# Patient Record
Sex: Female | Born: 1940
Health system: Southern US, Community
[De-identification: ages and names within clinical notes are randomized; demographics above are authoritative.]

## PROBLEM LIST (undated history)

## (undated) DIAGNOSIS — R350 Frequency of micturition: Secondary | ICD-10-CM

## (undated) DIAGNOSIS — F419 Anxiety disorder, unspecified: Secondary | ICD-10-CM

## (undated) DIAGNOSIS — R55 Syncope and collapse: Secondary | ICD-10-CM

## (undated) DIAGNOSIS — J069 Acute upper respiratory infection, unspecified: Secondary | ICD-10-CM

## (undated) DIAGNOSIS — R0602 Shortness of breath: Secondary | ICD-10-CM

## (undated) DIAGNOSIS — H269 Unspecified cataract: Secondary | ICD-10-CM

## (undated) DIAGNOSIS — M1990 Unspecified osteoarthritis, unspecified site: Secondary | ICD-10-CM

## (undated) DIAGNOSIS — N189 Chronic kidney disease, unspecified: Secondary | ICD-10-CM

## (undated) DIAGNOSIS — M545 Low back pain: Secondary | ICD-10-CM

## (undated) DIAGNOSIS — I639 Cerebral infarction, unspecified: Secondary | ICD-10-CM

## (undated) DIAGNOSIS — G4700 Insomnia, unspecified: Secondary | ICD-10-CM

## (undated) DIAGNOSIS — N318 Other neuromuscular dysfunction of bladder: Secondary | ICD-10-CM

## (undated) DIAGNOSIS — K449 Diaphragmatic hernia without obstruction or gangrene: Secondary | ICD-10-CM

## (undated) DIAGNOSIS — R002 Palpitations: Secondary | ICD-10-CM

## (undated) DIAGNOSIS — R42 Dizziness and giddiness: Secondary | ICD-10-CM

## (undated) DIAGNOSIS — G894 Chronic pain syndrome: Secondary | ICD-10-CM

## (undated) DIAGNOSIS — Z8679 Personal history of other diseases of the circulatory system: Secondary | ICD-10-CM

## (undated) DIAGNOSIS — J45909 Unspecified asthma, uncomplicated: Secondary | ICD-10-CM

## (undated) DIAGNOSIS — G4710 Hypersomnia, unspecified: Secondary | ICD-10-CM

## (undated) DIAGNOSIS — J309 Allergic rhinitis, unspecified: Secondary | ICD-10-CM

## (undated) DIAGNOSIS — Z8719 Personal history of other diseases of the digestive system: Secondary | ICD-10-CM

## (undated) DIAGNOSIS — D649 Anemia, unspecified: Secondary | ICD-10-CM

## (undated) DIAGNOSIS — K219 Gastro-esophageal reflux disease without esophagitis: Secondary | ICD-10-CM

## (undated) DIAGNOSIS — J449 Chronic obstructive pulmonary disease, unspecified: Secondary | ICD-10-CM

## (undated) DIAGNOSIS — K140 Glossitis: Secondary | ICD-10-CM

## (undated) DIAGNOSIS — R930 Abnormal findings on diagnostic imaging of skull and head, not elsewhere classified: Secondary | ICD-10-CM

## (undated) DIAGNOSIS — I509 Heart failure, unspecified: Secondary | ICD-10-CM

## (undated) DIAGNOSIS — M810 Age-related osteoporosis without current pathological fracture: Secondary | ICD-10-CM

## (undated) DIAGNOSIS — I10 Essential (primary) hypertension: Secondary | ICD-10-CM

## (undated) DIAGNOSIS — E785 Hyperlipidemia, unspecified: Secondary | ICD-10-CM

## (undated) DIAGNOSIS — Z5189 Encounter for other specified aftercare: Secondary | ICD-10-CM

## (undated) HISTORY — DX: Frequency of micturition: R35.0

## (undated) HISTORY — DX: Glossitis: K14.0

## (undated) HISTORY — DX: Cerebral infarction, unspecified: I63.9

## (undated) HISTORY — DX: Insomnia, unspecified: G47.00

## (undated) HISTORY — DX: Anxiety disorder, unspecified: F41.9

## (undated) HISTORY — DX: Syncope and collapse: R55

## (undated) HISTORY — DX: Shortness of breath: R06.02

## (undated) HISTORY — PX: LUMBAR DISC SURGERY: SHX700

## (undated) HISTORY — DX: Unspecified cataract: H26.9

## (undated) HISTORY — DX: Personal history of other diseases of the digestive system: Z87.19

## (undated) HISTORY — DX: Essential (primary) hypertension: I10

## (undated) HISTORY — PX: COLONOSCOPY: SHX174

## (undated) HISTORY — DX: Unspecified asthma, uncomplicated: J45.909

## (undated) HISTORY — DX: Acute upper respiratory infection, unspecified: J06.9

## (undated) HISTORY — DX: Other neuromuscular dysfunction of bladder: N31.8

## (undated) HISTORY — DX: Chronic pain syndrome: G89.4

## (undated) HISTORY — DX: Morbid (severe) obesity due to excess calories: E66.01

## (undated) HISTORY — DX: Allergic rhinitis, unspecified: J30.9

## (undated) HISTORY — DX: Palpitations: R00.2

## (undated) HISTORY — DX: Dizziness and giddiness: R42

## (undated) HISTORY — PX: TUBAL LIGATION: SHX77

## (undated) HISTORY — DX: Encounter for other specified aftercare: Z51.89

## (undated) HISTORY — DX: Chronic obstructive pulmonary disease, unspecified: J44.9

## (undated) HISTORY — DX: Age-related osteoporosis without current pathological fracture: M81.0

## (undated) HISTORY — DX: Low back pain: M54.5

## (undated) HISTORY — DX: Hypersomnia, unspecified: G47.10

## (undated) HISTORY — DX: Chronic kidney disease, unspecified: N18.9

## (undated) HISTORY — DX: Unspecified osteoarthritis, unspecified site: M19.90

## (undated) HISTORY — DX: Hyperlipidemia, unspecified: E78.5

## (undated) HISTORY — DX: Gastro-esophageal reflux disease without esophagitis: K21.9

## (undated) HISTORY — DX: Anemia, unspecified: D64.9

## (undated) HISTORY — DX: Diaphragmatic hernia without obstruction or gangrene: K44.9

## (undated) HISTORY — PX: TONSILLECTOMY: SUR1361

## (undated) HISTORY — DX: Personal history of other diseases of the circulatory system: Z86.79

## (undated) HISTORY — DX: Abnormal findings on diagnostic imaging of skull and head, not elsewhere classified: R93.0

---

## 1997-09-16 ENCOUNTER — Other Ambulatory Visit: Admission: RE | Admit: 1997-09-16 | Discharge: 1997-09-16 | Payer: Self-pay | Admitting: Obstetrics and Gynecology

## 1998-06-15 ENCOUNTER — Ambulatory Visit (HOSPITAL_COMMUNITY): Admission: RE | Admit: 1998-06-15 | Discharge: 1998-06-15 | Payer: Self-pay | Admitting: Internal Medicine

## 1998-06-15 ENCOUNTER — Encounter: Payer: Self-pay | Admitting: Internal Medicine

## 1998-08-30 ENCOUNTER — Encounter: Payer: Self-pay | Admitting: Neurosurgery

## 1998-09-02 ENCOUNTER — Inpatient Hospital Stay (HOSPITAL_COMMUNITY): Admission: RE | Admit: 1998-09-02 | Discharge: 1998-09-03 | Payer: Self-pay | Admitting: Neurosurgery

## 1998-09-02 ENCOUNTER — Encounter: Payer: Self-pay | Admitting: Neurosurgery

## 1999-03-17 ENCOUNTER — Ambulatory Visit (HOSPITAL_COMMUNITY): Admission: RE | Admit: 1999-03-17 | Discharge: 1999-03-17 | Payer: Self-pay | Admitting: Internal Medicine

## 1999-03-17 ENCOUNTER — Encounter: Payer: Self-pay | Admitting: Internal Medicine

## 2001-09-14 ENCOUNTER — Ambulatory Visit (HOSPITAL_COMMUNITY): Admission: RE | Admit: 2001-09-14 | Discharge: 2001-09-14 | Payer: Self-pay | Admitting: Orthopedic Surgery

## 2001-09-14 ENCOUNTER — Encounter: Payer: Self-pay | Admitting: Orthopedic Surgery

## 2002-01-30 ENCOUNTER — Other Ambulatory Visit: Admission: RE | Admit: 2002-01-30 | Discharge: 2002-01-30 | Payer: Self-pay | Admitting: Obstetrics and Gynecology

## 2003-04-02 ENCOUNTER — Other Ambulatory Visit: Admission: RE | Admit: 2003-04-02 | Discharge: 2003-04-02 | Payer: Self-pay | Admitting: Obstetrics and Gynecology

## 2004-01-05 ENCOUNTER — Ambulatory Visit (HOSPITAL_COMMUNITY): Admission: RE | Admit: 2004-01-05 | Discharge: 2004-01-05 | Payer: Self-pay | Admitting: Internal Medicine

## 2004-04-15 ENCOUNTER — Ambulatory Visit: Payer: Self-pay | Admitting: Internal Medicine

## 2004-05-12 ENCOUNTER — Ambulatory Visit: Payer: Self-pay | Admitting: Internal Medicine

## 2004-05-13 ENCOUNTER — Other Ambulatory Visit: Admission: RE | Admit: 2004-05-13 | Discharge: 2004-05-13 | Payer: Self-pay | Admitting: Obstetrics and Gynecology

## 2004-09-15 ENCOUNTER — Ambulatory Visit: Payer: Self-pay | Admitting: Internal Medicine

## 2004-11-29 ENCOUNTER — Ambulatory Visit: Payer: Self-pay | Admitting: Internal Medicine

## 2004-12-23 ENCOUNTER — Ambulatory Visit: Payer: Self-pay | Admitting: Gastroenterology

## 2004-12-29 ENCOUNTER — Ambulatory Visit: Payer: Self-pay | Admitting: Gastroenterology

## 2005-02-16 ENCOUNTER — Ambulatory Visit: Payer: Self-pay | Admitting: Internal Medicine

## 2005-02-24 ENCOUNTER — Ambulatory Visit: Payer: Self-pay | Admitting: Gastroenterology

## 2005-02-24 LAB — HM COLONOSCOPY

## 2005-06-10 ENCOUNTER — Encounter: Payer: Self-pay | Admitting: *Deleted

## 2005-06-10 ENCOUNTER — Inpatient Hospital Stay (HOSPITAL_COMMUNITY): Admission: EM | Admit: 2005-06-10 | Discharge: 2005-06-12 | Payer: Self-pay | Admitting: Neurology

## 2005-06-12 ENCOUNTER — Encounter (INDEPENDENT_AMBULATORY_CARE_PROVIDER_SITE_OTHER): Payer: Self-pay | Admitting: Cardiology

## 2005-06-17 ENCOUNTER — Ambulatory Visit: Payer: Self-pay | Admitting: Family Medicine

## 2005-06-26 ENCOUNTER — Ambulatory Visit: Payer: Self-pay | Admitting: Internal Medicine

## 2005-07-11 ENCOUNTER — Ambulatory Visit: Payer: Self-pay | Admitting: Internal Medicine

## 2005-09-07 ENCOUNTER — Ambulatory Visit: Payer: Self-pay | Admitting: Internal Medicine

## 2005-09-19 ENCOUNTER — Ambulatory Visit: Payer: Self-pay | Admitting: Internal Medicine

## 2005-09-28 ENCOUNTER — Ambulatory Visit: Payer: Self-pay | Admitting: Internal Medicine

## 2005-10-20 ENCOUNTER — Ambulatory Visit: Payer: Self-pay | Admitting: Internal Medicine

## 2005-10-30 ENCOUNTER — Ambulatory Visit: Payer: Self-pay | Admitting: Cardiovascular Disease

## 2005-12-08 ENCOUNTER — Ambulatory Visit: Payer: Self-pay | Admitting: Internal Medicine

## 2005-12-29 ENCOUNTER — Ambulatory Visit: Payer: Self-pay | Admitting: Internal Medicine

## 2006-07-26 ENCOUNTER — Other Ambulatory Visit: Admission: RE | Admit: 2006-07-26 | Discharge: 2006-07-26 | Payer: Self-pay | Admitting: Obstetrics and Gynecology

## 2006-09-20 ENCOUNTER — Ambulatory Visit (HOSPITAL_COMMUNITY): Admission: RE | Admit: 2006-09-20 | Discharge: 2006-09-20 | Payer: Self-pay | Admitting: Anesthesiology

## 2006-12-09 DIAGNOSIS — J449 Chronic obstructive pulmonary disease, unspecified: Secondary | ICD-10-CM

## 2006-12-09 DIAGNOSIS — Z8679 Personal history of other diseases of the circulatory system: Secondary | ICD-10-CM

## 2006-12-09 DIAGNOSIS — I10 Essential (primary) hypertension: Secondary | ICD-10-CM | POA: Insufficient documentation

## 2006-12-09 DIAGNOSIS — Z8719 Personal history of other diseases of the digestive system: Secondary | ICD-10-CM

## 2006-12-09 DIAGNOSIS — M545 Low back pain, unspecified: Secondary | ICD-10-CM

## 2006-12-09 DIAGNOSIS — M1990 Unspecified osteoarthritis, unspecified site: Secondary | ICD-10-CM | POA: Insufficient documentation

## 2006-12-09 DIAGNOSIS — J309 Allergic rhinitis, unspecified: Secondary | ICD-10-CM

## 2006-12-09 DIAGNOSIS — K219 Gastro-esophageal reflux disease without esophagitis: Secondary | ICD-10-CM | POA: Insufficient documentation

## 2006-12-09 DIAGNOSIS — J4489 Other specified chronic obstructive pulmonary disease: Secondary | ICD-10-CM

## 2006-12-09 DIAGNOSIS — J45909 Unspecified asthma, uncomplicated: Secondary | ICD-10-CM | POA: Insufficient documentation

## 2006-12-09 DIAGNOSIS — M810 Age-related osteoporosis without current pathological fracture: Secondary | ICD-10-CM

## 2006-12-09 HISTORY — DX: Morbid (severe) obesity due to excess calories: E66.01

## 2006-12-09 HISTORY — DX: Essential (primary) hypertension: I10

## 2006-12-09 HISTORY — DX: Personal history of other diseases of the digestive system: Z87.19

## 2006-12-09 HISTORY — DX: Allergic rhinitis, unspecified: J30.9

## 2006-12-09 HISTORY — DX: Age-related osteoporosis without current pathological fracture: M81.0

## 2006-12-09 HISTORY — DX: Unspecified asthma, uncomplicated: J45.909

## 2006-12-09 HISTORY — DX: Chronic obstructive pulmonary disease, unspecified: J44.9

## 2006-12-09 HISTORY — DX: Unspecified osteoarthritis, unspecified site: M19.90

## 2006-12-09 HISTORY — DX: Gastro-esophageal reflux disease without esophagitis: K21.9

## 2006-12-09 HISTORY — DX: Personal history of other diseases of the circulatory system: Z86.79

## 2006-12-09 HISTORY — DX: Other specified chronic obstructive pulmonary disease: J44.89

## 2006-12-09 HISTORY — DX: Low back pain, unspecified: M54.50

## 2007-02-22 ENCOUNTER — Telehealth (INDEPENDENT_AMBULATORY_CARE_PROVIDER_SITE_OTHER): Payer: Self-pay | Admitting: *Deleted

## 2007-03-13 ENCOUNTER — Telehealth: Payer: Self-pay | Admitting: Internal Medicine

## 2007-04-18 ENCOUNTER — Ambulatory Visit: Payer: Self-pay | Admitting: Internal Medicine

## 2007-04-18 DIAGNOSIS — E785 Hyperlipidemia, unspecified: Secondary | ICD-10-CM | POA: Insufficient documentation

## 2007-04-18 HISTORY — DX: Hyperlipidemia, unspecified: E78.5

## 2007-04-18 LAB — CONVERTED CEMR LAB
AST: 24 units/L (ref 0–37)
Albumin: 3.9 g/dL (ref 3.5–5.2)
Basophils Absolute: 0.1 10*3/uL (ref 0.0–0.1)
Chloride: 103 meq/L (ref 96–112)
Cholesterol: 193 mg/dL (ref 0–200)
Eosinophils Absolute: 0.2 10*3/uL (ref 0.0–0.6)
GFR calc Af Amer: 64 mL/min
GFR calc non Af Amer: 52 mL/min
HCT: 36.3 % (ref 36.0–46.0)
HDL: 48.9 mg/dL (ref >39.0)
Hemoglobin, Urine: NEGATIVE
Ketones, ur: NEGATIVE mg/dL
LDL Cholesterol: 129 mg/dL — ABNORMAL HIGH (ref 0–99)
Lymphocytes Relative: 41.8 % (ref 12.0–46.0)
MCHC: 34.4 g/dL (ref 30.0–36.0)
MCV: 84.3 fL (ref 78.0–100.0)
Neutro Abs: 2 10*3/uL (ref 1.4–7.7)
Neutrophils Relative %: 41.2 % — ABNORMAL LOW (ref 43.0–77.0)
Platelets: 319 10*3/uL (ref 150–400)
RBC: 4.31 M/uL (ref 3.87–5.11)
Sodium: 141 meq/L (ref 135–145)
TSH: 0.58 microintl units/mL (ref 0.35–5.50)
Total CHOL/HDL Ratio: 3.9
Urine Glucose: NEGATIVE mg/dL

## 2007-10-10 ENCOUNTER — Ambulatory Visit: Payer: Self-pay | Admitting: Family Medicine

## 2007-10-10 ENCOUNTER — Encounter: Payer: Self-pay | Admitting: Internal Medicine

## 2007-10-22 ENCOUNTER — Ambulatory Visit: Payer: Self-pay | Admitting: Endocrinology

## 2007-10-22 DIAGNOSIS — J069 Acute upper respiratory infection, unspecified: Secondary | ICD-10-CM

## 2007-10-22 HISTORY — DX: Acute upper respiratory infection, unspecified: J06.9

## 2007-11-22 ENCOUNTER — Telehealth (INDEPENDENT_AMBULATORY_CARE_PROVIDER_SITE_OTHER): Payer: Self-pay | Admitting: *Deleted

## 2007-11-28 ENCOUNTER — Ambulatory Visit: Payer: Self-pay | Admitting: Internal Medicine

## 2007-11-28 ENCOUNTER — Encounter (INDEPENDENT_AMBULATORY_CARE_PROVIDER_SITE_OTHER): Payer: Self-pay | Admitting: *Deleted

## 2007-11-28 DIAGNOSIS — R1011 Right upper quadrant pain: Secondary | ICD-10-CM

## 2007-11-28 DIAGNOSIS — R109 Unspecified abdominal pain: Secondary | ICD-10-CM

## 2007-11-28 LAB — CONVERTED CEMR LAB
ALT: 17 units/L (ref 0–35)
AST: 21 units/L (ref 0–37)
Basophils Relative: 1.2 % (ref 0.0–3.0)
Bilirubin Urine: NEGATIVE
Bilirubin, Direct: 0.1 mg/dL (ref 0.0–0.3)
CO2: 29 meq/L (ref 19–32)
Calcium: 10.4 mg/dL (ref 8.4–10.5)
Chloride: 102 meq/L (ref 96–112)
Creatinine, Ser: 1.1 mg/dL (ref 0.4–1.2)
Eosinophils Relative: 2.9 % (ref 0.0–5.0)
Glucose, Bld: 83 mg/dL (ref 70–99)
HCT: 35.9 % — ABNORMAL LOW (ref 36.0–46.0)
Hemoglobin, Urine: NEGATIVE
Hemoglobin: 12.2 g/dL (ref 12.0–15.0)
Lymphocytes Relative: 47 % — ABNORMAL HIGH (ref 12.0–46.0)
Monocytes Absolute: 0.7 10*3/uL (ref 0.1–1.0)
Monocytes Relative: 9.2 % (ref 3.0–12.0)
Mucus, UA: NEGATIVE
Neutro Abs: 2.9 10*3/uL (ref 1.4–7.7)
RBC: 4.27 M/uL (ref 3.87–5.11)
Sodium: 139 meq/L (ref 135–145)
Squamous Epithelial / LPF: NEGATIVE /lpf
Total Bilirubin: 0.5 mg/dL (ref 0.3–1.2)
Total Protein, Urine: NEGATIVE mg/dL
Total Protein: 8.3 g/dL (ref 6.0–8.3)
Urine Glucose: NEGATIVE mg/dL
WBC: 7.4 10*3/uL (ref 4.5–10.5)
pH: 6 (ref 5.0–8.0)

## 2007-12-03 ENCOUNTER — Telehealth (INDEPENDENT_AMBULATORY_CARE_PROVIDER_SITE_OTHER): Payer: Self-pay | Admitting: *Deleted

## 2007-12-03 ENCOUNTER — Encounter: Admission: RE | Admit: 2007-12-03 | Discharge: 2007-12-03 | Payer: Self-pay | Admitting: Internal Medicine

## 2007-12-03 ENCOUNTER — Encounter (INDEPENDENT_AMBULATORY_CARE_PROVIDER_SITE_OTHER): Payer: Self-pay | Admitting: *Deleted

## 2008-02-03 ENCOUNTER — Telehealth (INDEPENDENT_AMBULATORY_CARE_PROVIDER_SITE_OTHER): Payer: Self-pay | Admitting: *Deleted

## 2008-04-14 ENCOUNTER — Ambulatory Visit: Payer: Self-pay | Admitting: Internal Medicine

## 2008-04-14 DIAGNOSIS — N318 Other neuromuscular dysfunction of bladder: Secondary | ICD-10-CM | POA: Insufficient documentation

## 2008-04-14 HISTORY — DX: Other neuromuscular dysfunction of bladder: N31.8

## 2008-04-14 LAB — CONVERTED CEMR LAB
Bacteria, UA: NEGATIVE
Bilirubin Urine: NEGATIVE
Crystals: NEGATIVE
Hemoglobin, Urine: NEGATIVE
Total Protein, Urine: NEGATIVE mg/dL
Urine Glucose: NEGATIVE mg/dL
Urobilinogen, UA: 0.2 (ref 0.0–1.0)

## 2008-05-06 ENCOUNTER — Telehealth (INDEPENDENT_AMBULATORY_CARE_PROVIDER_SITE_OTHER): Payer: Self-pay | Admitting: *Deleted

## 2008-05-21 ENCOUNTER — Telehealth (INDEPENDENT_AMBULATORY_CARE_PROVIDER_SITE_OTHER): Payer: Self-pay | Admitting: *Deleted

## 2008-05-29 ENCOUNTER — Telehealth (INDEPENDENT_AMBULATORY_CARE_PROVIDER_SITE_OTHER): Payer: Self-pay | Admitting: *Deleted

## 2008-08-14 ENCOUNTER — Telehealth: Payer: Self-pay | Admitting: Internal Medicine

## 2008-08-31 ENCOUNTER — Ambulatory Visit: Payer: Self-pay | Admitting: Internal Medicine

## 2008-08-31 DIAGNOSIS — G4710 Hypersomnia, unspecified: Secondary | ICD-10-CM

## 2008-08-31 HISTORY — DX: Hypersomnia, unspecified: G47.10

## 2008-10-01 ENCOUNTER — Encounter: Admission: RE | Admit: 2008-10-01 | Discharge: 2008-10-01 | Payer: Self-pay | Admitting: Anesthesiology

## 2008-10-07 ENCOUNTER — Telehealth: Payer: Self-pay | Admitting: Internal Medicine

## 2008-10-14 ENCOUNTER — Telehealth (INDEPENDENT_AMBULATORY_CARE_PROVIDER_SITE_OTHER): Payer: Self-pay | Admitting: *Deleted

## 2008-10-17 ENCOUNTER — Ambulatory Visit: Payer: Self-pay | Admitting: Internal Medicine

## 2008-10-17 ENCOUNTER — Encounter (INDEPENDENT_AMBULATORY_CARE_PROVIDER_SITE_OTHER): Payer: Self-pay | Admitting: *Deleted

## 2008-10-17 ENCOUNTER — Observation Stay (HOSPITAL_COMMUNITY): Admission: EM | Admit: 2008-10-17 | Discharge: 2008-10-18 | Payer: Self-pay | Admitting: Emergency Medicine

## 2008-10-17 ENCOUNTER — Telehealth: Payer: Self-pay | Admitting: Family Medicine

## 2008-10-18 ENCOUNTER — Encounter: Payer: Self-pay | Admitting: Internal Medicine

## 2008-11-05 ENCOUNTER — Ambulatory Visit: Payer: Self-pay | Admitting: Internal Medicine

## 2008-11-05 DIAGNOSIS — G4700 Insomnia, unspecified: Secondary | ICD-10-CM

## 2008-11-05 DIAGNOSIS — R930 Abnormal findings on diagnostic imaging of skull and head, not elsewhere classified: Secondary | ICD-10-CM

## 2008-11-05 DIAGNOSIS — R55 Syncope and collapse: Secondary | ICD-10-CM

## 2008-11-05 HISTORY — DX: Syncope and collapse: R55

## 2008-11-05 HISTORY — DX: Insomnia, unspecified: G47.00

## 2008-11-05 HISTORY — DX: Abnormal findings on diagnostic imaging of skull and head, not elsewhere classified: R93.0

## 2008-11-16 ENCOUNTER — Telehealth: Payer: Self-pay | Admitting: Internal Medicine

## 2008-11-30 ENCOUNTER — Ambulatory Visit: Payer: Self-pay | Admitting: Internal Medicine

## 2008-11-30 LAB — CONVERTED CEMR LAB
Albumin: 3.8 g/dL (ref 3.5–5.2)
Alkaline Phosphatase: 76 units/L (ref 39–117)
BUN: 11 mg/dL (ref 6–23)
Basophils Absolute: 0.1 10*3/uL (ref 0.0–0.1)
Bilirubin Urine: NEGATIVE
CO2: 29 meq/L (ref 19–32)
Calcium: 10.2 mg/dL (ref 8.4–10.5)
Cholesterol: 155 mg/dL (ref 0–200)
Creatinine, Ser: 1 mg/dL (ref 0.4–1.2)
Eosinophils Absolute: 0.1 10*3/uL (ref 0.0–0.7)
GFR calc non Af Amer: 70.53 mL/min (ref >60)
Glucose, Bld: 98 mg/dL (ref 70–99)
HDL: 45.6 mg/dL (ref >39.00)
Hemoglobin: 11.6 g/dL — ABNORMAL LOW (ref 12.0–15.0)
Ketones, ur: NEGATIVE mg/dL
LDL Cholesterol: 88 mg/dL (ref 0–99)
Lymphocytes Relative: 37.6 % (ref 12.0–46.0)
MCHC: 33.5 g/dL (ref 30.0–36.0)
Neutro Abs: 2.5 10*3/uL (ref 1.4–7.7)
Platelets: 292 10*3/uL (ref 150.0–400.0)
RDW: 13 % (ref 11.5–14.6)
TSH: 0.49 microintl units/mL (ref 0.35–5.50)
Total Bilirubin: 0.5 mg/dL (ref 0.3–1.2)
Total Protein, Urine: NEGATIVE mg/dL
VLDL: 21 mg/dL (ref 0.0–40.0)
pH: 6 (ref 5.0–8.0)

## 2008-12-09 ENCOUNTER — Telehealth (INDEPENDENT_AMBULATORY_CARE_PROVIDER_SITE_OTHER): Payer: Self-pay | Admitting: *Deleted

## 2008-12-10 ENCOUNTER — Encounter: Payer: Self-pay | Admitting: Cardiology

## 2008-12-10 ENCOUNTER — Ambulatory Visit: Payer: Self-pay

## 2009-01-14 ENCOUNTER — Ambulatory Visit: Payer: Self-pay | Admitting: Internal Medicine

## 2009-01-14 DIAGNOSIS — D649 Anemia, unspecified: Secondary | ICD-10-CM

## 2009-01-14 DIAGNOSIS — R1319 Other dysphagia: Secondary | ICD-10-CM

## 2009-01-14 DIAGNOSIS — K140 Glossitis: Secondary | ICD-10-CM | POA: Insufficient documentation

## 2009-01-14 DIAGNOSIS — R1013 Epigastric pain: Secondary | ICD-10-CM

## 2009-01-14 DIAGNOSIS — D509 Iron deficiency anemia, unspecified: Secondary | ICD-10-CM | POA: Insufficient documentation

## 2009-01-14 HISTORY — DX: Anemia, unspecified: D64.9

## 2009-01-14 HISTORY — DX: Glossitis: K14.0

## 2009-01-15 LAB — CONVERTED CEMR LAB
Basophils Relative: 0.7 % (ref 0.0–3.0)
Eosinophils Relative: 1.5 % (ref 0.0–5.0)
HCT: 35.6 % — ABNORMAL LOW (ref 36.0–46.0)
Lymphs Abs: 2.3 10*3/uL (ref 0.7–4.0)
MCV: 84.2 fL (ref 78.0–100.0)
Monocytes Absolute: 1 10*3/uL (ref 0.1–1.0)
Neutro Abs: 4.7 10*3/uL (ref 1.4–7.7)
RBC: 4.23 M/uL (ref 3.87–5.11)
WBC: 8.2 10*3/uL (ref 4.5–10.5)

## 2009-01-20 ENCOUNTER — Emergency Department (HOSPITAL_COMMUNITY): Admission: EM | Admit: 2009-01-20 | Discharge: 2009-01-20 | Payer: Self-pay | Admitting: Family Medicine

## 2009-02-16 ENCOUNTER — Ambulatory Visit: Payer: Self-pay | Admitting: Gastroenterology

## 2009-02-16 DIAGNOSIS — G894 Chronic pain syndrome: Secondary | ICD-10-CM

## 2009-02-16 HISTORY — DX: Chronic pain syndrome: G89.4

## 2009-03-02 ENCOUNTER — Ambulatory Visit: Payer: Self-pay | Admitting: Gastroenterology

## 2009-04-19 ENCOUNTER — Ambulatory Visit: Payer: Self-pay | Admitting: Gastroenterology

## 2009-04-19 ENCOUNTER — Encounter: Payer: Self-pay | Admitting: Internal Medicine

## 2009-04-19 ENCOUNTER — Telehealth: Payer: Self-pay | Admitting: Internal Medicine

## 2009-04-19 LAB — CONVERTED CEMR LAB
Basophils Relative: 0.6 % (ref 0.0–3.0)
Eosinophils Relative: 2.9 % (ref 0.0–5.0)
HCT: 37.4 % (ref 36.0–46.0)
Hemoglobin: 12.4 g/dL (ref 12.0–15.0)
MCV: 86.9 fL (ref 78.0–100.0)
Monocytes Absolute: 0.6 10*3/uL (ref 0.1–1.0)
Neutrophils Relative %: 46.2 % (ref 43.0–77.0)
RBC: 4.3 M/uL (ref 3.87–5.11)
WBC: 5.9 10*3/uL (ref 4.5–10.5)

## 2009-04-28 ENCOUNTER — Telehealth: Payer: Self-pay | Admitting: Gastroenterology

## 2009-05-03 ENCOUNTER — Telehealth: Payer: Self-pay | Admitting: Internal Medicine

## 2009-06-28 ENCOUNTER — Telehealth: Payer: Self-pay | Admitting: Internal Medicine

## 2009-08-11 ENCOUNTER — Encounter (INDEPENDENT_AMBULATORY_CARE_PROVIDER_SITE_OTHER): Payer: Self-pay | Admitting: *Deleted

## 2009-09-30 ENCOUNTER — Encounter: Admission: RE | Admit: 2009-09-30 | Discharge: 2009-09-30 | Payer: Self-pay | Admitting: Anesthesiology

## 2009-10-27 ENCOUNTER — Encounter: Payer: Self-pay | Admitting: Internal Medicine

## 2009-11-02 ENCOUNTER — Telehealth: Payer: Self-pay | Admitting: Internal Medicine

## 2009-11-18 ENCOUNTER — Encounter: Payer: Self-pay | Admitting: Internal Medicine

## 2010-01-04 ENCOUNTER — Emergency Department (HOSPITAL_COMMUNITY): Admission: EM | Admit: 2010-01-04 | Discharge: 2010-01-04 | Payer: Self-pay | Admitting: Emergency Medicine

## 2010-01-18 ENCOUNTER — Encounter: Payer: Self-pay | Admitting: Internal Medicine

## 2010-01-18 ENCOUNTER — Ambulatory Visit: Payer: Self-pay | Admitting: Internal Medicine

## 2010-01-27 ENCOUNTER — Ambulatory Visit: Payer: Self-pay | Admitting: Internal Medicine

## 2010-01-27 DIAGNOSIS — R0602 Shortness of breath: Secondary | ICD-10-CM

## 2010-01-27 DIAGNOSIS — R002 Palpitations: Secondary | ICD-10-CM

## 2010-01-27 HISTORY — DX: Shortness of breath: R06.02

## 2010-01-27 HISTORY — DX: Palpitations: R00.2

## 2010-01-27 LAB — CONVERTED CEMR LAB
Albumin: 4.1 g/dL (ref 3.5–5.2)
Basophils Absolute: 0.1 10*3/uL (ref 0.0–0.1)
CO2: 29 meq/L (ref 19–32)
Calcium: 10.4 mg/dL (ref 8.4–10.5)
Eosinophils Absolute: 0.2 10*3/uL (ref 0.0–0.7)
Glucose, Bld: 91 mg/dL (ref 70–99)
H Pylori IgG: POSITIVE
HCT: 35.2 % — ABNORMAL LOW (ref 36.0–46.0)
Hemoglobin: 11.9 g/dL — ABNORMAL LOW (ref 12.0–15.0)
Leukocytes, UA: NEGATIVE
Lymphocytes Relative: 34.8 % (ref 12.0–46.0)
Lymphs Abs: 2.1 10*3/uL (ref 0.7–4.0)
MCHC: 33.9 g/dL (ref 30.0–36.0)
Monocytes Absolute: 0.6 10*3/uL (ref 0.1–1.0)
Neutro Abs: 3.2 10*3/uL (ref 1.4–7.7)
RDW: 14.4 % (ref 11.5–14.6)
Sodium: 139 meq/L (ref 135–145)
Specific Gravity, Urine: 1.015 (ref 1.000–1.030)
TSH: 0.67 microintl units/mL (ref 0.35–5.50)
Urobilinogen, UA: 0.2 (ref 0.0–1.0)

## 2010-01-28 ENCOUNTER — Telehealth (INDEPENDENT_AMBULATORY_CARE_PROVIDER_SITE_OTHER): Payer: Self-pay | Admitting: *Deleted

## 2010-01-28 ENCOUNTER — Telehealth: Payer: Self-pay | Admitting: Internal Medicine

## 2010-02-01 ENCOUNTER — Encounter: Admission: RE | Admit: 2010-02-01 | Discharge: 2010-02-01 | Payer: Self-pay | Admitting: Internal Medicine

## 2010-02-02 ENCOUNTER — Ambulatory Visit (HOSPITAL_COMMUNITY): Admission: RE | Admit: 2010-02-02 | Discharge: 2010-02-02 | Payer: Self-pay | Admitting: Internal Medicine

## 2010-02-02 ENCOUNTER — Encounter: Payer: Self-pay | Admitting: Internal Medicine

## 2010-02-02 ENCOUNTER — Ambulatory Visit: Payer: Self-pay

## 2010-02-02 ENCOUNTER — Ambulatory Visit: Payer: Self-pay | Admitting: Cardiovascular Disease

## 2010-02-03 ENCOUNTER — Encounter: Admission: RE | Admit: 2010-02-03 | Discharge: 2010-02-03 | Payer: Self-pay | Admitting: Anesthesiology

## 2010-02-15 ENCOUNTER — Telehealth: Payer: Self-pay | Admitting: Internal Medicine

## 2010-02-16 ENCOUNTER — Ambulatory Visit: Payer: Self-pay | Admitting: Cardiovascular Disease

## 2010-02-18 ENCOUNTER — Telehealth: Payer: Self-pay | Admitting: Gastroenterology

## 2010-02-18 ENCOUNTER — Encounter: Payer: Self-pay | Admitting: Internal Medicine

## 2010-02-22 ENCOUNTER — Encounter: Payer: Self-pay | Admitting: Internal Medicine

## 2010-02-24 ENCOUNTER — Ambulatory Visit: Payer: Self-pay | Admitting: Internal Medicine

## 2010-03-01 ENCOUNTER — Ambulatory Visit: Payer: Self-pay | Admitting: Gastroenterology

## 2010-03-01 ENCOUNTER — Encounter (INDEPENDENT_AMBULATORY_CARE_PROVIDER_SITE_OTHER): Payer: Self-pay | Admitting: *Deleted

## 2010-03-01 DIAGNOSIS — K449 Diaphragmatic hernia without obstruction or gangrene: Secondary | ICD-10-CM

## 2010-03-01 HISTORY — DX: Diaphragmatic hernia without obstruction or gangrene: K44.9

## 2010-03-02 ENCOUNTER — Telehealth: Payer: Self-pay | Admitting: Gastroenterology

## 2010-03-02 LAB — CONVERTED CEMR LAB
AST: 17 units/L (ref 0–37)
Albumin: 3.9 g/dL (ref 3.5–5.2)
Alkaline Phosphatase: 96 units/L (ref 39–117)
Basophils Absolute: 0.1 10*3/uL (ref 0.0–0.1)
Bilirubin, Direct: 0 mg/dL (ref 0.0–0.3)
CO2: 29 meq/L (ref 19–32)
Eosinophils Relative: 2.4 % (ref 0.0–5.0)
Ferritin: 16.5 ng/mL (ref 10.0–291.0)
Folate: >20 ng/mL
GFR calc non Af Amer: 84.77 mL/min (ref >60)
Glucose, Bld: 82 mg/dL (ref 70–99)
Iron: 35 ug/dL — ABNORMAL LOW (ref 42–145)
Monocytes Absolute: 0.8 10*3/uL (ref 0.1–1.0)
Monocytes Relative: 11.8 % (ref 3.0–12.0)
Neutrophils Relative %: 43.9 % (ref 43.0–77.0)
Platelets: 318 10*3/uL (ref 150.0–400.0)
Potassium: 3.7 meq/L (ref 3.5–5.1)
RDW: 14.1 % (ref 11.5–14.6)
Saturation Ratios: 9.3 % — ABNORMAL LOW (ref 20.0–50.0)
Sed Rate: 26 mm/hr — ABNORMAL HIGH (ref 0–22)
Sodium: 139 meq/L (ref 135–145)
Transferrin: 269 mg/dL (ref 212.0–360.0)
WBC: 6.6 10*3/uL (ref 4.5–10.5)

## 2010-03-11 ENCOUNTER — Ambulatory Visit: Payer: Self-pay | Admitting: Gastroenterology

## 2010-03-15 ENCOUNTER — Telehealth: Payer: Self-pay | Admitting: Gastroenterology

## 2010-03-21 ENCOUNTER — Telehealth: Payer: Self-pay | Admitting: Gastroenterology

## 2010-03-22 ENCOUNTER — Telehealth: Payer: Self-pay | Admitting: Internal Medicine

## 2010-03-30 ENCOUNTER — Ambulatory Visit: Payer: Self-pay | Admitting: Internal Medicine

## 2010-03-30 DIAGNOSIS — R350 Frequency of micturition: Secondary | ICD-10-CM | POA: Insufficient documentation

## 2010-03-30 DIAGNOSIS — R42 Dizziness and giddiness: Secondary | ICD-10-CM

## 2010-03-30 HISTORY — DX: Dizziness and giddiness: R42

## 2010-03-30 HISTORY — DX: Frequency of micturition: R35.0

## 2010-03-30 LAB — CONVERTED CEMR LAB
Bilirubin Urine: NEGATIVE
Hemoglobin, Urine: NEGATIVE
Ketones, ur: NEGATIVE mg/dL
Leukocytes, UA: NEGATIVE
Specific Gravity, Urine: <=1.005 (ref 1.000–1.030)
Urine Glucose: NEGATIVE mg/dL
Urobilinogen, UA: 0.2 (ref 0.0–1.0)

## 2010-03-31 ENCOUNTER — Encounter: Payer: Self-pay | Admitting: Internal Medicine

## 2010-04-06 ENCOUNTER — Encounter: Payer: Self-pay | Admitting: Cardiovascular Disease

## 2010-05-12 ENCOUNTER — Encounter: Payer: Self-pay | Admitting: Internal Medicine

## 2010-05-17 ENCOUNTER — Telehealth: Payer: Self-pay | Admitting: Internal Medicine

## 2010-05-27 ENCOUNTER — Telehealth: Payer: Self-pay | Admitting: Internal Medicine

## 2010-05-31 NOTE — Letter (Signed)
Summary: New Patient letter  [facility]  [address] [place], [place] [address]   Phone: [phone]  Fax: [phone]       08/11/2009 MRN: [id number]  Wendy Torres [address] [place], [place]  [address]  Dear Wendy Torres,  Welcome to the Gastroenterology Division at [facility].    You are scheduled to see Dr.  Jarold Motto on 09-09-09 at 9:00a.m. on the 3rd floor at [facility], [address]  We ask that you try to arrive at our office 15 minutes prior to your appointment time to allow for check-in.  We would like you to complete the enclosed self-administered evaluation form prior to your visit and bring it with you on the day of your appointment.  We will review it with you.  Also, please bring a complete list of all your medications or, if you prefer, bring the medication bottles and we will list them.  Please bring your insurance card so that we may make a copy of it.  If your insurance requires a referral to see a specialist, please bring your referral form from your primary care physician.  Co-payments are due at the time of your visit and may be paid by cash, check or credit card.     Your office visit will consist of a consult with your physician (includes a physical exam), any laboratory testing he/she may order, scheduling of any necessary diagnostic testing (e.g. x-ray, ultrasound, CT-scan), and scheduling of a procedure (e.g. Endoscopy, Colonoscopy) if required.  Please allow enough time on your schedule to allow for any/all of these possibilities.    If you cannot keep your appointment, please call [phone] to cancel or reschedule prior to your appointment date.  This allows [place] the opportunity to schedule an appointment for another patient in need of care.  If you do not cancel or reschedule by 5 p.m. the business day prior to your appointment date, you will be charged a $50.00 late cancellation/no-show fee.    Thank you for choosing  [facility] for your medical needs.  We appreciate the opportunity to care for you.  Please visit [place] at our website  to learn more about our practice.                     Sincerely,                                                             The Gastroenterology Division

## 2010-05-31 NOTE — Letter (Signed)
Summary: EGD Instructions  [facility]  [address] [place], [place] [address]   Phone: [phone]  Fax: [phone]       Wendy Torres    Aug 12, 1940    MRN: [id number]       Procedure Day /Date: 03/04/2010 Friday     Arrival Time: 2:00pm     Procedure Time: 3:00pm     Location of Procedure:                    [facility] (4th Floor)   PREPARATION FOR ENDOSCOPY   On 03/04/2010 THE DAY OF THE PROCEDURE:  1.   No solid foods, milk or milk products are allowed after midnight the night before your procedure.  2.   Do not drink anything colored red or purple.  Avoid juices with pulp.  No orange juice.  3.  You may drink clear liquids until 1:00pm, which is 2 hours before your procedure.                                                                                                CLEAR LIQUIDS INCLUDE: Water Jello Ice Popsicles Tea (sugar ok, no milk/cream) Powdered fruit flavored drinks Coffee (sugar ok, no milk/cream) Gatorade Juice: apple, white grape, white cranberry  Lemonade Clear bullion, consomm, broth Carbonated beverages (any kind) Strained chicken noodle soup Hard Candy   MEDICATION INSTRUCTIONS  Unless otherwise instructed, you should take regular prescription medications with a small sip of water as early as possible the morning of your procedure.                 OTHER INSTRUCTIONS  You will need a responsible adult at least 71 years of age to accompany you and drive you home.   This person must remain in the waiting room during your procedure.  Wear loose fitting clothing that is easily removed.  Leave jewelry and other valuables at home.  However, you may wish to bring a book to read or an iPod/MP3 player to listen to music as you wait for your procedure to start.  Remove all body piercing jewelry and leave at home.  Total time from sign-in until discharge is approximately 2-3 hours.  You should go home  directly after your procedure and rest.  You can resume normal activities the day after your procedure.  The day of your procedure you should not:   Drive   Make legal decisions   Operate machinery   Drink alcohol   Return to work  You will receive specific instructions about eating, activities and medications before you leave.    The above instructions have been reviewed and explained to me by   _______________________    I fully understand and can verbalize these instructions _____________________________ Date _________     Appended Document: EGD Instructions called pt and her daughter and advised them the procedure is 03/11/2010 arriving at 2pm for a 3pm procedure.

## 2010-05-31 NOTE — Assessment & Plan Note (Signed)
Summary: EPIGASTRIC PAIN/YF    History of Present Illness Visit Type: consult  Primary GI MD: Sheryn Bison MD FACP FAGA Primary Provider: Corwin Levins, MD  Requesting Provider: Corwin Levins, MD Chief Complaint: Dysphagia with solids and epigastirc pain  History of Present Illness:   Extremely Pleasant 70 year old [place] American female with chronic recurrent acid reflux symptoms despite daily therapy with omeprazole 40 mg. She has now intermittent solid food dysphagia in her substernal area without specific hepatobiliary or lower gastrointestinal complaints. She's had previous multiple GI evaluations with last endoscopy and esophageal dilatation in October 2006. She in the past has been treated for H. pylori infection.   GI Review of Systems    Reports abdominal pain and  dysphagia with solids.     Location of  Abdominal pain: epigastric area.    Denies acid reflux, belching, bloating, chest pain, dysphagia with liquids, heartburn, loss of appetite, nausea, vomiting, vomiting blood, weight loss, and  weight gain.        Denies anal fissure, black tarry stools, change in bowel habit, constipation, diarrhea, diverticulosis, fecal incontinence, heme positive stool, hemorrhoids, irritable bowel syndrome, jaundice, light color stool, liver problems, rectal bleeding, and  rectal pain.    Current Medications (verified): 1)  Lovastatin 40 Mg  Tabs (Lovastatin) .Marland Kitchen.. 1 By Mouth Once Daily 2)  Amlodipine Besy-Benazepril Hcl 5-20 Mg Caps (Amlodipine Besy-Benazepril Hcl) .... 2 By Mouth in The Am 3)  Omeprazole 20 Mg  Cpdr (Omeprazole) .... 2 By Mouth Once Daily 4)  Neurontin 100 Mg  Caps (Gabapentin) .Marland Kitchen.. 1 Q 8 Hrs. Nerve Pain 5)  Mobic 15 Mg  Tabs (Meloxicam) .Marland Kitchen.. 1 By Mouth Qd 6)  Ecotrin 325 Mg  Tbec (Aspirin) .Marland Kitchen.. 1 By Mouth Qd 7)  Fexofenadine Hcl 180 Mg Tabs (Fexofenadine Hcl) .Marland Kitchen.. 1po Once Daily As Needed 8)  Oxybutynin Chloride 5 Mg Xr24h-Tab (Oxybutynin Chloride) .Marland Kitchen.. 1 Tab Once  Daily 9)  Fluticasone Propionate 50 Mcg/act Susp (Fluticasone Propionate) .... 2 Spray/side Once Daily 10)  Furosemide 20 Mg Tabs (Furosemide) .Marland Kitchen.. 1 By Mouth Every Am 11)  Oxycodone-Acetaminophen 5-325 Mg Tabs (Oxycodone-Acetaminophen) .Marland Kitchen.. 1 By Mouth Every 6-8 Hours For Breakthrough Pain 12)  Vitamin D3 1000 Unit Caps (Cholecalciferol) .Marland Kitchen.. 1 By Mouth Once Daily 13)  Zolpidem Tartrate 10 Mg Tabs (Zolpidem Tartrate) .Marland Kitchen.. 1 By Mouth At Bedtime As Needed 14)  Alprazolam 0.25 Mg Tabs (Alprazolam) .Marland Kitchen.. 1po Two Times A Day As Needed 15)  Magic Mouthwash .... Use Asd - 5 Cc By Mouth Swish and Spit Four Times Per Day As Needed 16)  Clonidine Hcl 0.2 Mg Tabs (Clonidine Hcl) .... Take One Tablet By Mouth Twice A Day 17)  Alendronate Sodium 70 Mg Tabs (Alendronate Sodium) .Marland Kitchen.. 1 Tab Weekly  Allergies (verified): No Known Drug Allergies  Past History:  Past medical, surgical, family and social histories (including risk factors) reviewed for relevance to current acute and chronic problems.  Past Medical History: Morbid Obesity Degenerative Joint Disease Allergic rhinitis Asthma GERD Hypertension Low back pain Osteoporosis COPD Transient ischemic attack, hx of/post circulation Hyperlipidemia bilat hip djd - severe HIATAL HERNIA (ICD-553.3) EPIGASTRIC PAIN (ICD-789.06) DYSPNEA (ICD-786.05) PALPITATIONS, RECURRENT (ICD-785.1) CHRONIC PAIN SYNDROME (ICD-338.4) GLOSSITIS (ICD-529.0) OTHER DYSPHAGIA (ICD-787.29) ANEMIA (ICD-285.9) SYNCOPE (ICD-780.2) INSOMNIA-SLEEP DISORDER-UNSPEC (ICD-780.52) HYPERSOMNIA (ICD-780.54) OVERACTIVE BLADDER (ICD-596.51) HELICOBACTER PYLORI GASTRITIS, HX OF (ICD-V12.79)  Past Surgical History: Reviewed history from 02/16/2009 and no changes required. s/p lumbar disc surgury Tubal Ligation  Family History: Reviewed history from 02/16/2010 and no  changes required. mother deceased with pacemaker father deceased  with lung cancer brother with HTN No  CAD No FH of Colon Cancer:  Social History: Reviewed history from 02/16/2010 and no changes required. Former Smoker (smoked 1/2 ppd for 30 years)-none for 25 years Alcohol use-no Occupation:[employer]  Married, 10 children (8 girls/2 boys) Daily Caffeine Use -2 cups coffe Illicit Drug Use - no  Review of Systems       The patient complains of allergy/sinus, arthritis/joint pain, back pain, headaches-new, muscle pains/cramps, and urination - excessive.  The patient denies anemia, anxiety-new, blood in urine, breast changes/lumps, change in vision, confusion, cough, coughing up blood, depression-new, fainting, fatigue, fever, hearing problems, heart murmur, heart rhythm changes, itching, menstrual pain, night sweats, nosebleeds, pregnancy symptoms, shortness of breath, skin rash, sleeping problems, sore throat, swelling of feet/legs, swollen lymph glands, thirst - excessive , urination - excessive , urination changes/pain, urine leakage, vision changes, and voice change.    Vital Signs:  Patient profile:   70 year old female Height:      64 inches Weight:      173 pounds BMI:     29.80 BSA:     1.84 Pulse rate:   56 / minute Pulse rhythm:   regular BP sitting:   136 / 74  (left arm) Cuff size:   large  Vitals Entered By: Ok Anis CMA (March 01, 2010 2:35 PM)  Physical Exam  General:  Well developed, well nourished, no acute distress. Head:  Normocephalic and atraumatic. Eyes:  PERRLA, no icterus.exam deferred to patient's ophthalmologist.   Mouth:  No deformity or lesions, dentition normal. Neck:  Supple; no masses or thyromegaly. Lungs:  Clear throughout to auscultation. Heart:  Regular rate and rhythm; no murmurs, rubs,  or bruits. Abdomen:  Soft, nontender and nondistended. No masses, hepatosplenomegaly or hernias noted. Normal bowel sounds. Extremities:  No clubbing, cyanosis, edema or deformities noted. Neurologic:  Alert and  oriented x4;  grossly normal  neurologically. Cervical Nodes:  No significant cervical adenopathy. Psych:  Alert and cooperative. Normal mood and affect.   Impression & Recommendations:  Problem # 1:  GERD (ICD-530.81) Assessment Deteriorated We Will Try Exelon 60 mg a day with p.r.n. Carafate suspension.standard anti-reflex maneuvers have been reviewed with the patient and her daughter was present throughout the exam and interview. Screening labs have been ordered additionally. This patient has chronic pain syndrome and a history of multiple somatic and functional complaints. Orders: TLB-CBC Platelet - w/Differential (85025-CBCD) TLB-BMP (Basic Metabolic Panel-BMET) (80048-METABOL) TLB-Hepatic/Liver Function Pnl (80076-HEPATIC) TLB-TSH (Thyroid Stimulating Hormone) (84443-TSH) TLB-B12, Serum-Total ONLY (16109-U04) TLB-Ferritin (82728-FER) TLB-Folic Acid (Folate) (82746-FOL) TLB-IBC Pnl (Iron/FE;Transferrin) (83550-IBC) TLB-Sedimentation Rate (ESR) (85652-ESR) EGD (EGD)  Problem # 2:  PREVENTIVE HEALTH CARE (ICD-V70.0) Assessment: Unchanged continue other medications as per primary care.  Problem # 3:  GLOSSITIS (ICD-529.0) Assessment: Improved Consider recurrent candida infection. The patient recently has completed an empiric course of [facility] Magic mouthwash therapy.  Problem # 4:  PALPITATIONS, RECURRENT (ICD-785.1) Assessment: Unchanged  Problem # 5:  INSOMNIA-SLEEP DISORDER-UNSPEC (ICD-780.52) Assessment: Comment Only  Problem # 6:  COPD (ICD-496) Assessment: Unchanged  Patient Instructions: 1)  Copy sent to : Corwin Levins, MD  2)  Your prescription(s) have been sent to you pharmacy.  3)  Please go to the basement today for your labs.  4)  Your procedure has been scheduled for 03/04/2010, please follow the seperate instructions.  5)  The medication list was reviewed and reconciled.  All  changed / newly prescribed medications were explained.  A complete medication list was provided to the patient /  caregiver. 6)  Avoid foods high in acid content ( tomatoes, citrus juices, spicy foods) . Avoid eating within 3 to 4 hours of lying down or before exercising. Do not over eat; try smaller more frequent meals. Elevate head of bed four inches when sleeping.  7)  Conscious Sedation brochure given.  8)  Upper Endoscopy with Dilatation brochure given.  Prescriptions: DEXILANT 60 MG CPDR (DEXLANSOPRAZOLE) take one by mouth once daily  #30 x 3   Entered by:   Harlow Mares CMA (AAMA)   Authorized by:   Mardella Layman MD [facility]   Signed by:   Harlow Mares CMA (AAMA) on 03/01/2010   Method used:   [facility] [address] [phone]* (retail)       [address]       [place], [place]  [address]       Ph: [phone] or [phone]       Fax: [phone]   RxID:   [id number]   Appended Document: EPIGASTRIC PAIN/YF called pt and her daughter and advised them the procedure is 03/11/2010 arriving at 2pm for a 3pm procedure.

## 2010-05-31 NOTE — Miscellaneous (Signed)
Summary: Dexilant samples  Clinical Lists Changes  Medications: Changed medication from DEXILANT 60 MG CPDR (DEXLANSOPRAZOLE) take one by mouth once daily to DEXILANT 60 MG CPDR (DEXLANSOPRAZOLE) take one by mouth once daily

## 2010-05-31 NOTE — Progress Notes (Signed)
Summary: ALT med  Phone Note Call from Patient   Caller: Daughter Sheralyn Boatman [phone] Summary of Call: Pt's daughter called stating that pt's Rx for Amplodipine is now $15 per month which pt cannot afford. Pt is requesting Rx for more affordable alternative. Initial call taken by: Margaret Pyle, CMA,  March 22, 2010 11:26 AM  Follow-up for Phone Call        I think she means the amlodpine-benazepril combo med she is taking  per the internet, amlodipine is $4/month at [facility], or $10/72mo, and the benazepril also is $4/mo at most pharmacies  ok to change the rx to the two separate meds - to robin to handle Follow-up by: Corwin Levins MD,  March 22, 2010 1:14 PM    New/Updated Medications: AMLODIPINE BESYLATE 5 MG TABS (AMLODIPINE BESYLATE) 1 by mouth once daily BENAZEPRIL HCL 20 MG TABS (BENAZEPRIL HCL) 1 by mouth once daily Prescriptions: BENAZEPRIL HCL 20 MG TABS (BENAZEPRIL HCL) 1 by mouth once daily  #30 x 11   Entered by:   Zella Ball Ewing CMA (AAMA)   Authorized by:   Corwin Levins MD   Signed by:   Scharlene Gloss CMA (AAMA) on 03/22/2010   Method used:   [facility]ed to ...       [facility] [facility] [address] [phone]* (retail)       [address]       [place], [place]  [address]       Ph: [phone] or [phone]       Fax: [phone]   RxID:   [id number] AMLODIPINE BESYLATE 5 MG TABS (AMLODIPINE BESYLATE) 1 by mouth once daily  #30 x 11   Entered by:   Scharlene Gloss CMA (AAMA)   Authorized by:   Corwin Levins MD   Signed by:   Scharlene Gloss CMA (AAMA) on 03/22/2010   Method used:   [facility]ed to ...       [facility] [address] [phone]* (retail)       [address]       [place], [place]  [address]       Ph: [phone] or [phone]       Fax: [phone]   RxID:   [id number]   Appended Document: ALT med called pts. daughter and informed prescription sent to pharmacy.

## 2010-05-31 NOTE — Progress Notes (Signed)
----   Converted from flag ---- ---- 01/28/2010 9:24 AM, Edman Circle wrote: appt 10/5 @ 4:00 for echo appt 10/19 @ 10:15 with mcalhany  ---- 01/27/2010 3:20 PM, Dagoberto Reef wrote: Pt need echo and to see cardiologist.  Thanks  ---- 01/27/2010 2:48 PM, Corwin Levins MD wrote: The following orders have been entered for this patient and placed on Admin Hold:  Type:     Referral       Code:   GI Description:   Gastroenterology Referral Order Date:   01/27/2010   Authorized By:   Corwin Levins MD Order #:   [id number] Clinical Notes: ------------------------------

## 2010-05-31 NOTE — Progress Notes (Signed)
----   Converted from flag ---- ---- 01/27/2010 10:02 PM, Corwin Levins MD wrote: plesae call pt - I added clonidine for BP due to severe elevation recetnly -  [facility] ------------------------------  called pt to inform left msg. to call back  Pt's daughter advised of above and also advised on daughter's question: yes allergy medication recieved at [facility] can be purchased OTC. Margaret Pyle, CMA  January 28, 2010 10:05 AM

## 2010-05-31 NOTE — Medication Information (Signed)
Summary: Prior autho for [facility]  Prior autho for [facility]   Imported By: Sherian Rein 02/24/2010 12:07:48  _____________________________________________________________________  External Attachment:    Type:   Image     Comment:   External Document

## 2010-05-31 NOTE — Progress Notes (Signed)
Summary: med refill  Phone Note Refill Request  on May 03, 2009 1:24 PM  Refills Requested: Medication #1:  ZOLPIDEM TARTRATE 10 MG TABS 1 by mouth at bedtime as needed   Dosage confirmed as above?Dosage Confirmed   Notes[facility] [phone] Initial call taken by: Scharlene Gloss,  May 03, 2009 1:25 PM  Follow-up for Phone Call        done hardcopy to LIM side B - dahlia  Follow-up by: Corwin Levins MD,  May 03, 2009 3:33 PM    New/Updated Medications: ZOLPIDEM TARTRATE 10 MG TABS (ZOLPIDEM TARTRATE) 1 by mouth at bedtime as needed Prescriptions: ZOLPIDEM TARTRATE 10 MG TABS (ZOLPIDEM TARTRATE) 1 by mouth at bedtime as needed  #30 x 5   Entered and Authorized by:   Corwin Levins MD   Signed by:   Corwin Levins MD on 05/03/2009   Method used:   Print then Give to Patient   RxID:   [id number]  [facility] spoke with Lisa/pharmacist ok # 30 with 5 refills....Marland KitchenLMB 1/3/114:05pm

## 2010-05-31 NOTE — Assessment & Plan Note (Signed)
Summary: bp check/ok'd time - 9am/cd   Nurse Visit   Allergies: No Known Drug Allergies   PT SEEN IN FOR office visit DUE TO ELEVATED BP

## 2010-05-31 NOTE — Medication Information (Signed)
Summary: Prolia Coverage  Prolia Coverage   Imported By: Lester Elida 02/24/2010 08:58:32  _____________________________________________________________________  External Attachment:    Type:   Image     Comment:   External Document

## 2010-05-31 NOTE — Progress Notes (Signed)
Summary: Decline Colonoscopy   Phone Note Outgoing Call Call back at Home Phone [phone]   Call placed by: Quincy Carnes, RN Call placed to: Patient Details for Reason: colonscopy ordered by Dr. Annia Friendly Summary of Call: Pt was very nice, but stated she could not afford any more medical care.  She would not agree to schedule a colonscopy at this time.  I gave pt my number, if she changed her mind. Initial call taken by: Dixie S. Doss, RN  Follow-up for Phone Call        noted...Marland Kitchenat least have her do stool cards Follow-up by: Mardella Layman MD FACG,  March 16, 2010 11:18 AM  Additional Follow-up for Phone Call Additional follow up Details #1::        pt did ifob 03/02/2009, does she need it again? Additional Follow-up by: Harlow Mares CMA Duncan Dull),  March 16, 2010 11:27 AM    Additional Follow-up for Phone Call Additional follow up Details #2::    NO. Follow-up by: Mardella Layman MD FACG,  March 16, 2010 11:52 AM  Additional Follow-up for Phone Call Additional follow up Details #3:: Details for Additional Follow-up Action Taken: pt aware and she will call back to schedule her colonoscopy.  Additional Follow-up by: Harlow Mares CMA (AAMA),  March 16, 2010 11:59 AM

## 2010-05-31 NOTE — Assessment & Plan Note (Signed)
Summary: np6/recurrant palp/jml  Medications Added OXYBUTYNIN CHLORIDE 5 MG XR24H-TAB (OXYBUTYNIN CHLORIDE) 1 tab once daily ALENDRONATE SODIUM 70 MG TABS (ALENDRONATE SODIUM) 1 tab weekly      Allergies Added: NKDA  Visit Type:  new pt visit Primary Wendy Torres:  Oliver Barre, MD  CC:  palpitations when she had a recent anxiety attack.....sob at times....denies any edema or cp.  History of Present Illness: 70 yo AAF with history of HTN, hyperlipidemia, COPD who is here today for further evaluation of palpitations and dyspnea. She was seen by Dr Jonny Ruiz a few weeks ago and commented on palpitations with anxiety attacks. She describes feeling her heart racing, no skipped beats when she is anxious. This has not occurred since she was started on the as needed benzodiazepine. No chest pain or dyspnea. No near syncope, syncope or lower ext. edema. She has recently had poorly controlled BP. Dr. Jonny Ruiz started clonidine 0.1 mg by mouth two times a day at the last office visit three weeks ago. No visual changes, headaches, dizziness.   Current Medications (verified): 1)  Lovastatin 40 Mg  Tabs (Lovastatin) .Marland Kitchen.. 1 By Mouth Qd 2)  Amlodipine Besylate 10 Mg Tabs (Amlodipine Besylate) .Marland Kitchen.. 1 By Mouth Qd 3)  Benazepril Hcl 40 Mg Tabs (Benazepril Hcl) .Marland Kitchen.. 1po Once Daily 4)  Omeprazole 20 Mg  Cpdr (Omeprazole) .... 2 By Mouth Once Daily 5)  Neurontin 100 Mg  Caps (Gabapentin) .Marland Kitchen.. 1 Q 8 Hrs. Nerve Pain 6)  Mobic 15 Mg  Tabs (Meloxicam) .Marland Kitchen.. 1 By Mouth Qd 7)  Ecotrin 325 Mg  Tbec (Aspirin) .Marland Kitchen.. 1 By Mouth Qd 8)  Fexofenadine Hcl 180 Mg Tabs (Fexofenadine Hcl) .Marland Kitchen.. 1po Once Daily As Needed 9)  Oxybutynin Chloride 5 Mg Xr24h-Tab (Oxybutynin Chloride) .Marland Kitchen.. 1 Tab Once Daily 10)  Fluticasone Propionate 50 Mcg/act Susp (Fluticasone Propionate) .... 2 Spray/side Once Daily 11)  Furosemide 20 Mg Tabs (Furosemide) .Marland Kitchen.. 1 By Mouth Every Am 12)  Oxycodone-Acetaminophen 5-325 Mg Tabs (Oxycodone-Acetaminophen) .Marland Kitchen.. 1 By  Mouth Every 6-8 Hours For Breakthrough Pain 13)  Vitamin D3 1000 Unit Caps (Cholecalciferol) .Marland Kitchen.. 1 By Mouth Once Daily 14)  Zolpidem Tartrate 10 Mg Tabs (Zolpidem Tartrate) .Marland Kitchen.. 1 By Mouth At Bedtime As Needed 15)  Alprazolam 0.25 Mg Tabs (Alprazolam) .Marland Kitchen.. 1po Two Times A Day As Needed 16)  Magic Mouthwash .... Use Asd - 5 Cc By Mouth Swish and Spit Four Times Per Day As Needed 17)  Clonidine Hcl 0.1 Mg Tabs (Clonidine Hcl) .Marland Kitchen.. 1 By Mouth Two Times A Day 18)  Alendronate Sodium 70 Mg Tabs (Alendronate Sodium) .Marland Kitchen.. 1 Tab Weekly  Allergies (verified): No Known Drug Allergies  Past History:  Past Medical History: Reviewed history from 01/27/2010 and no changes required. Helicobacter pylori gastritis, hx Morbid Obesity Degenerative Joint Disease Allergic rhinitis Asthma GERD Hypertension Low back pain Osteoporosis COPD Transient ischemic attack, hx of/post circulation Hyperlipidemia bilat hip djd - severe  Past Surgical History: Reviewed history from 02/16/2009 and no changes required. s/p lumbar disc surgury Tubal Ligation  Family History: Reviewed history from 04/18/2007 and no changes required. mother deceased with pacemaker father deceased  with lung cancer brother with HTN  No CAD  Social History: Reviewed history from 02/16/2009 and no changes required. Former Smoker (smoked 1/2 ppd for 30 years)-none for 25 years Alcohol use-no Occupation:[employer]  Married, 10 children (8 girls/2 boys) Daily Caffeine Use -2 cups coffe Illicit Drug Use - no  Review of Systems  The patient complains of palpitations.  The patient denies fatigue, malaise, fever, weight gain/loss, vision loss, decreased hearing, hoarseness, chest pain, shortness of breath, prolonged cough, wheezing, sleep apnea, coughing up blood, abdominal pain, blood in stool, nausea, vomiting, diarrhea, heartburn, incontinence, blood in urine, muscle weakness, joint pain, leg swelling, rash, skin lesions,  headache, fainting, dizziness, depression, anxiety, enlarged lymph nodes, easy bruising or bleeding, and environmental allergies.    Vital Signs:  Patient profile:   70 year old female Height:      64 inches Weight:      172.4 pounds BMI:     29.70 Pulse rate:   67 / minute Pulse rhythm:   regular BP sitting:   182 / 86  (left arm) Cuff size:   large  Vitals Entered By: Danielle Rankin, CMA (February 16, 2010 10:21 AM)  Physical Exam  General:  General: Well developed, well nourished, NAD HEENT: OP clear, mucus membranes moist SKIN: warm, dry Neuro: No focal deficits Musculoskeletal: Muscle strength 5/5 all ext Psychiatric: Mood and affect normal Neck: No JVD, no carotid bruits, no thyromegaly, no lymphadenopathy. Lungs:Clear bilaterally, no wheezes, rhonci, crackles CV: RRR no murmurs, gallops rubs Abdomen: soft, NT, ND, BS present Extremities: No edema, pulses 2+.    Echocardiogram  Procedure date:  02/02/2010  Findings:      Left ventricle: Systolic function was normal. The estimated       ejection fraction was in the range of 60% to 65%.     - Left atrium: The atrium was mildly dilated.     - Pulmonary arteries: Systolic pressure was mildly increased. PA       peak pressure: 44mm Hg (S).  EKG  Procedure date:  02/16/2010  Findings:      NSR, rate 67 bpm. Normal EKG  Impression & Recommendations:  Problem # 1:  PALPITATIONS, RECURRENT (ICD-785.1) She has had no palpitations over the last three weeks. Her resting EKG is normal. Her palpitations are related to episodes of anxiety. Her LV function is normal by recent echo. The differential includes SVT vs atrial fibrillation vs sinus tachycardia. I will not place an event monitor at this time but if she has any recurrence, we can place a monitor at that time. Her last episode was one month ago. TSH was normal.   Her updated medication list for this problem includes:    Amlodipine Besylate 10 Mg Tabs (Amlodipine  besylate) .Marland Kitchen... 1 by mouth qd    Benazepril Hcl 40 Mg Tabs (Benazepril hcl) .Marland Kitchen... 1po once daily    Ecotrin 325 Mg Tbec (Aspirin) .Marland Kitchen... 1 by mouth qd  Problem # 2:  HYPERTENSION (ICD-401.9) Still not controlled. Will increase her clonidine to 0.2 mg by mouth two times a day and have her follow up for nurse BP visit in primary care next week. Will order renal artery dopplers to exclude renovascular disease.   Her updated medication list for this problem includes:    Amlodipine Besylate 10 Mg Tabs (Amlodipine besylate) .Marland Kitchen... 1 by mouth qd    Benazepril Hcl 40 Mg Tabs (Benazepril hcl) .Marland Kitchen... 1po once daily    Ecotrin 325 Mg Tbec (Aspirin) .Marland Kitchen... 1 by mouth qd    Furosemide 20 Mg Tabs (Furosemide) .Marland Kitchen... 1 by mouth every am    Clonidine Hcl 0.1 Mg Tabs (Clonidine hcl) .Marland Kitchen... 1 by mouth two times a day  Other Orders: EKG w/ Interpretation (93000) Renal Artery Duplex (Renal Artery Duplex)  Patient Instructions: 1)  Your physician  recommends that you schedule a follow-up appointment in: 3 weeks. 2)  Your physician recommends that you continue on your current medications as directed. Please refer to the Current Medication list given to you today. 3)  Your physician has requested that you have a renal artery duplex. During this test, an ultrasound is used to evaluate blood flow to the kidneys. Allow one hour for this exam. Do not eat after midnight the day before and avoid carbonated beverages. Take your medications as you usually do. 4)  Please followup with your Primary Care to have your BP checked.  Appended Document: np6/recurrant palp/jml    Clinical Lists Changes  Medications: Changed medication from CLONIDINE HCL 0.1 MG TABS (CLONIDINE HCL) 1 by mouth two times a day to CLONIDINE HCL 0.2 MG TABS (CLONIDINE HCL) Take one tablet by mouth twice a day - Signed Rx of CLONIDINE HCL 0.2 MG TABS (CLONIDINE HCL) Take one tablet by mouth twice a day;  #30 x 3;  Signed;  Entered by: Whitney Maeola Sarah  RN;  Authorized by: Verne Carrow, MD;  Method used: Electronically to [facility] [address] [phone]*, [address] [place], [place], [place]  [address], Ph: [phone] or [phone], Fax: [phone]    Prescriptions: CLONIDINE HCL 0.2 MG TABS (CLONIDINE HCL) Take one tablet by mouth twice a day  #30 x 3   Entered by:   Whitney Maeola Sarah RN   Authorized by:   Verne Carrow, MD   Signed by:   Ellender Hose RN on 02/16/2010   Method used:   [facility]ronically to        [facility] [address] [phone]* (retail)       [address]       [place], [place]  [address]       Ph: [phone] or [phone]       Fax: [phone]   RxID:   [id number]

## 2010-05-31 NOTE — Progress Notes (Signed)
  Phone Note Refill Request  on June 28, 2009 9:26 AM  Refills Requested: Medication #1:  FOSAMAX 70 MG TABS 1 by mouth q wk   Dosage confirmed as above?Dosage Confirmed   Notes: [facility] Initial call taken by: Scharlene Gloss,  June 28, 2009 9:27 AM    Prescriptions: FOSAMAX 70 MG TABS (ALENDRONATE SODIUM) 1 by mouth q wk  #4 x 3   Entered by:   Scharlene Gloss   Authorized by:   Corwin Levins MD   Signed by:   Scharlene Gloss on 06/28/2009   Method used:   Faxed to ...       [facility] [address] [phone]* (retail)       [address]       [place], [place]  [address]       Ph: [phone] or [phone]       Fax: [phone]   RxID:   [id number]

## 2010-05-31 NOTE — Assessment & Plan Note (Signed)
Summary: 1 MTH FU---STC   Vital Signs:  Patient profile:   70 year old female Height:      64 inches Weight:      168.50 pounds BMI:     29.03 O2 Sat:      98 % on Room air Temp:     98 degrees F oral Pulse rate:   59 / minute BP supine:   112 / 62 BP sitting:   122 / 64  (left arm) BP standing:   132 / 68 Cuff size:   regular  Vitals Entered By: Zella Ball Ewing CMA Duncan Dull) (March 30, 2010 3:15 PM)  O2 Flow:  Room air CC: 1 month followup/RE   Primary Care Safiatou Islam:  Corwin Levins, MD   CC:  1 month followup/RE.  History of Present Illness: here for f/u; overall doing "ok except still  urinating every 30 min it seems without overt other GU symtpoms such as dysuria, hematuria, flank pain, also drinking of plenty of fluids, taking the fluid pill , but  the oxybutinin but does not seem to be working;   lost 4 lbs since sept 29 and  c/o dizziness/lightheaded but has chronic dizziness and not clear if any worse;  she has wetting accident if she cant get to the BR quickly enough, walks with cane .  No abd pain, fever , chills recetnly.  Has not tried other OAB meds or seen urology.  Also mentions her copay for the prolia is over $100 to which she cannot afford, so would like to re-start the fosamax.  Pt denies CP, worsening sob, doe, wheezing, orthopnea, pnd, worsening LE edema, palps, dizziness or syncope  Pt denies new neuro symptoms such as headache, facial or extremity weakness  Pt denies polydipsia, polyuria  Overall good compliance with meds, trying to follow low chol  diet, wt stable, little excercise however .  Still with major lower back and bilat knee/hip chronic pain and impairment, but at least no worse recently, pain relatviely well controlled, and no falls.   Problems Prior to Update: 1)  Frequency, Urinary  (ICD-788.41) 2)  Dizziness  (ICD-780.4) 3)  Hiatal Hernia  (ICD-553.3) 4)  Epigastric Pain  (ICD-789.06) 5)  Dyspnea  (ICD-786.05) 6)  Palpitations, Recurrent   (ICD-785.1) 7)  Preventive Health Care  (ICD-V70.0) 8)  Chronic Pain Syndrome  (ICD-338.4) 9)  Glossitis  (ICD-529.0) 10)  Other Dysphagia  (ICD-787.29) 11)  Anemia  (ICD-285.9) 12)  Epigastric Pain  (ICD-789.06) 13)  Syncope  (ICD-780.2) 14)  Insomnia-sleep Disorder-unspec  (ICD-780.52) 15)  Abnormal Chest Xray  (ICD-793.1) 16)  Hypersomnia  (ICD-780.54) 17)  Overactive Bladder  (ICD-596.51) 18)  Abdominal Pain, Suprapubic  (ICD-789.09) 19)  Abdominal Pain, Lower  (ICD-789.09) 20)  Abdominal Pain, Right Upper Quadrant  (ICD-789.01) 21)  Acute Uris of Unspecified Site  (ICD-465.9) 22)  Hyperlipidemia  (ICD-272.4) 23)  Preventive Health Care  (ICD-V70.0) 24)  Transient Ischemic Attack, Hx of  (ICD-V12.50) 25)  COPD  (ICD-496) 26)  Osteoporosis  (ICD-733.00) 27)  Low Back Pain  (ICD-724.2) 28)  Hypertension  (ICD-401.9) 29)  Gerd  (ICD-530.81) 30)  Asthma  (ICD-493.90) 31)  Allergic Rhinitis  (ICD-477.9) 32)  Degenerative Joint Disease  (ICD-715.90) 33)  Morbid Obesity  (ICD-278.01) 34)  Helicobacter Pylori Gastritis, Hx of  (ICD-V12.79)  Medications Prior to Update: 1)  Lovastatin 40 Mg  Tabs (Lovastatin) .Marland Kitchen.. 1 By Mouth Once Daily 2)  Amlodipine Besy-Benazepril Hcl 5-20 Mg Caps (Amlodipine Besy-Benazepril Hcl) .Marland KitchenMarland KitchenMarland Kitchen  2 By Mouth in The Am 3)  Omeprazole 40 Mg Cpdr (Omeprazole) .... Take One By Mouth Once Daily 4)  Neurontin 100 Mg  Caps (Gabapentin) .Marland Kitchen.. 1 Q 8 Hrs. Nerve Pain 5)  Mobic 15 Mg  Tabs (Meloxicam) .Marland Kitchen.. 1 By Mouth Qd 6)  Ecotrin 325 Mg  Tbec (Aspirin) .Marland Kitchen.. 1 By Mouth Qd 7)  Fexofenadine Hcl 180 Mg Tabs (Fexofenadine Hcl) .Marland Kitchen.. 1po Once Daily As Needed 8)  Oxybutynin Chloride 5 Mg Xr24h-Tab (Oxybutynin Chloride) .Marland Kitchen.. 1 Tab Once Daily 9)  Fluticasone Propionate 50 Mcg/act Susp (Fluticasone Propionate) .... 2 Spray/side Once Daily 10)  Furosemide 20 Mg Tabs (Furosemide) .Marland Kitchen.. 1 By Mouth Every Am 11)  Oxycodone-Acetaminophen 5-325 Mg Tabs (Oxycodone-Acetaminophen) .Marland Kitchen.. 1 By  Mouth Every 6-8 Hours For Breakthrough Pain 12)  Vitamin D3 1000 Unit Caps (Cholecalciferol) .Marland Kitchen.. 1 By Mouth Once Daily 13)  Zolpidem Tartrate 10 Mg Tabs (Zolpidem Tartrate) .Marland Kitchen.. 1 By Mouth At Bedtime As Needed 14)  Alprazolam 0.25 Mg Tabs (Alprazolam) .Marland Kitchen.. 1po Two Times A Day As Needed 15)  Magic Mouthwash .... Use Asd - 5 Cc By Mouth Swish and Spit Four Times Per Day As Needed 16)  Clonidine Hcl 0.2 Mg Tabs (Clonidine Hcl) .... Take One Tablet By Mouth Twice A Day 17)  Alendronate Sodium 70 Mg Tabs (Alendronate Sodium) .Marland Kitchen.. 1 Tab Weekly 18)  Ferrous Sulfate 325 (65 Fe) Mg Tabs (Ferrous Sulfate) .... Take One By Mouth Once Daily 19)  Amlodipine Besylate 5 Mg Tabs (Amlodipine Besylate) .Marland Kitchen.. 1 By Mouth Once Daily 20)  Benazepril Hcl 20 Mg Tabs (Benazepril Hcl) .Marland Kitchen.. 1 By Mouth Once Daily  Current Medications (verified): 1)  Lovastatin 40 Mg  Tabs (Lovastatin) .Marland Kitchen.. 1 By Mouth Once Daily 2)  Omeprazole 40 Mg Cpdr (Omeprazole) .... Take One By Mouth Once Daily 3)  Neurontin 100 Mg  Caps (Gabapentin) .Marland Kitchen.. 1 Q 8 Hrs. Nerve Pain 4)  Mobic 15 Mg  Tabs (Meloxicam) .Marland Kitchen.. 1 By Mouth Qd 5)  Ecotrin 325 Mg  Tbec (Aspirin) .Marland Kitchen.. 1 By Mouth Qd 6)  Fexofenadine Hcl 180 Mg Tabs (Fexofenadine Hcl) .Marland Kitchen.. 1po Once Daily As Needed 7)  Fluticasone Propionate 50 Mcg/act Susp (Fluticasone Propionate) .... 2 Spray/side Once Daily 8)  Furosemide 20 Mg Tabs (Furosemide) .Marland Kitchen.. 1 By Mouth Every Am 9)  Oxycodone-Acetaminophen 5-325 Mg Tabs (Oxycodone-Acetaminophen) .Marland Kitchen.. 1 By Mouth Every 6-8 Hours For Breakthrough Pain 10)  Vitamin D3 1000 Unit Caps (Cholecalciferol) .Marland Kitchen.. 1 By Mouth Once Daily 11)  Zolpidem Tartrate 10 Mg Tabs (Zolpidem Tartrate) .Marland Kitchen.. 1 By Mouth At Bedtime As Needed 12)  Alprazolam 0.25 Mg Tabs (Alprazolam) .Marland Kitchen.. 1po Two Times A Day As Needed 13)  Magic Mouthwash .... Use Asd - 5 Cc By Mouth Swish and Spit Four Times Per Day As Needed 14)  Clonidine Hcl 0.2 Mg Tabs (Clonidine Hcl) .... Take One Tablet By Mouth  Twice A Day 15)  Alendronate Sodium 70 Mg Tabs (Alendronate Sodium) .Marland Kitchen.. 1 Tab Weekly 16)  Ferrous Sulfate 325 (65 Fe) Mg Tabs (Ferrous Sulfate) .... Take One By Mouth Once Daily 17)  Amlodipine Besylate 5 Mg Tabs (Amlodipine Besylate) .Marland Kitchen.. 1 By Mouth Once Daily 18)  Benazepril Hcl 20 Mg Tabs (Benazepril Hcl) .Marland Kitchen.. 1 By Mouth Once Daily 19)  Alendronate Sodium 70 Mg Tabs (Alendronate Sodium) .Marland Kitchen.. 1po Q Wk 20)  Vesicare 5 Mg Tabs (Solifenacin Succinate) .Marland Kitchen.. 1po Once Daily  Allergies (verified): No Known Drug Allergies  Past History:  Past Surgical History: Last  updated: 02/16/2009 s/p lumbar disc surgury Tubal Ligation  Social History: Last updated: 02/16/2010 Former Smoker (smoked 1/2 ppd for 30 years)-none for 25 years Alcohol use-no Occupation:[employer]  Married, 10 children (8 girls/2 boys) Daily Caffeine Use -2 cups coffe Illicit Drug Use - no  Risk Factors: Smoking Status: quit (04/18/2007)  Past Medical History: Morbid Obesity Degenerative Joint Disease Allergic rhinitis Asthma GERD Hypertension Low back pain Osteoporosis COPD Transient ischemic attack, hx of/post circulation Hyperlipidemia bilat hip djd - severe HIATAL HERNIA (ICD-553.3) EPIGASTRIC PAIN (ICD-789.06) DYSPNEA (ICD-786.05) PALPITATIONS, RECURRENT (ICD-785.1) CHRONIC PAIN SYNDROME (ICD-338.4) GLOSSITIS (ICD-529.0) OTHER DYSPHAGIA (ICD-787.29) ANEMIA (ICD-285.9) SYNCOPE (ICD-780.2) INSOMNIA-SLEEP DISORDER-UNSPEC (ICD-780.52) HYPERSOMNIA (ICD-780.54) OVERACTIVE BLADDER (ICD-596.51) HELICOBACTER PYLORI GASTRITIS, HX OF (ICD-V12.79)  Review of Systems       all otherwise negative per pt -    Physical Exam  General:  alert and overweight-appearing.   Head:  normocephalic and atraumatic.   Eyes:  vision grossly intact, pupils equal, and pupils round.   Ears:  R ear normal and L ear normal.   Nose:  no external deformity and no nasal discharge.   Mouth:  no gingival abnormalities and  pharynx pink and moist.   Neck:  supple and no masses.   Lungs:  normal respiratory effort and normal breath sounds.   Heart:  normal rate and regular rhythm.   Abdomen:  soft and normal bowel sounds.  but does have mild epigastric tender (recent egd neg as per emr for appearance of gastritis) Msk:  no acute joint tenderness and no joint swelling. , but has pain, stiffness adn decreased ROM of lumbar, bilat hip and knees (right > left)  Extremities:  no edema, no erythema  Neurologic:  alert & oriented X3 and strength normal in all extremities.   Psych:  not depressed appearing and slightly anxious.     Impression & Recommendations:  Problem # 1:  DIZZINESS (ICD-780.4)  Her updated medication list for this problem includes:    Fexofenadine Hcl 180 Mg Tabs (Fexofenadine hcl) .Marland Kitchen... 1po once daily as needed chronic, not orthostatic by exam, Continue all previous medications as before this visit   Problem # 2:  FREQUENCY, URINARY (ICD-788.41)  The following medications were removed from the medication list:    Oxybutynin Chloride 5 Mg Xr24h-tab (Oxybutynin chloride) .Marland Kitchen... 1 tab once daily Her updated medication list for this problem includes:    Vesicare 5 Mg Tabs (Solifenacin succinate) .Marland Kitchen... 1po once daily  Orders: T-Culture, Urine ([id number]) TLB-Udip w/ Micro (81001-URINE) symtpoms most c/w OAB, to re-check for UTI, and gave 1 mo sample trial vesicare, as well as pt assist form to apply for ongoing rx if seems to help, will need urology if not helping  Problem # 3:  OSTEOPOROSIS (ICD-733.00)  Her updated medication list for this problem includes:    Alendronate Sodium 70 Mg Tabs (Alendronate sodium) .Marland Kitchen... 1 tab weekly    Alendronate Sodium 70 Mg Tabs (Alendronate sodium) .Marland Kitchen... 1po q wk ok to re-start the fosamax as she is unable to afford the prolia  Problem # 4:  HYPERTENSION (ICD-401.9)  The following medications were removed from the medication list:    Amlodipine  Besy-benazepril Hcl 5-20 Mg Caps (Amlodipine besy-benazepril hcl) .Marland Kitchen... 2 by mouth in the am Her updated medication list for this problem includes:    Furosemide 20 Mg Tabs (Furosemide) .Marland Kitchen... 1 by mouth every am    Clonidine Hcl 0.2 Mg Tabs (Clonidine hcl) .Marland Kitchen... Take one tablet by mouth twice a  day    Amlodipine Besylate 5 Mg Tabs (Amlodipine besylate) .Marland Kitchen... 1 by mouth once daily    Benazepril Hcl 20 Mg Tabs (Benazepril hcl) .Marland Kitchen... 1 by mouth once daily  BP today: 122/64 Prior BP: 136/74 (03/01/2010)  Labs Reviewed: K+: 3.7 (03/01/2010) Creat: : 0.9 (03/01/2010)   Chol: 155 (11/30/2008)   HDL: 45.60 (11/30/2008)   LDL: 88 (11/30/2008)   TG: 105.0 (11/30/2008) stable overall by hx and exam, ok to continue meds/tx as is   Complete Medication List: 1)  Lovastatin 40 Mg Tabs (Lovastatin) .Marland Kitchen.. 1 by mouth once daily 2)  Omeprazole 40 Mg Cpdr (Omeprazole) .... Take one by mouth once daily 3)  Neurontin 100 Mg Caps (Gabapentin) .Marland Kitchen.. 1 q 8 hrs. nerve pain 4)  Mobic 15 Mg Tabs (Meloxicam) .Marland Kitchen.. 1 by mouth qd 5)  Ecotrin 325 Mg Tbec (Aspirin) .Marland Kitchen.. 1 by mouth qd 6)  Fexofenadine Hcl 180 Mg Tabs (Fexofenadine hcl) .Marland Kitchen.. 1po once daily as needed 7)  Fluticasone Propionate 50 Mcg/act Susp (Fluticasone propionate) .... 2 spray/side once daily 8)  Furosemide 20 Mg Tabs (Furosemide) .Marland Kitchen.. 1 by mouth every am 9)  Oxycodone-acetaminophen 5-325 Mg Tabs (Oxycodone-acetaminophen) .Marland Kitchen.. 1 by mouth every 6-8 hours for breakthrough pain 10)  Vitamin D3 1000 Unit Caps (Cholecalciferol) .Marland Kitchen.. 1 by mouth once daily 11)  Zolpidem Tartrate 10 Mg Tabs (Zolpidem tartrate) .Marland Kitchen.. 1 by mouth at bedtime as needed 12)  Alprazolam 0.25 Mg Tabs (Alprazolam) .Marland Kitchen.. 1po two times a day as needed 13)  Magic Mouthwash  .... Use asd - 5 cc by mouth swish and spit four times per day as needed 14)  Clonidine Hcl 0.2 Mg Tabs (Clonidine hcl) .... Take one tablet by mouth twice a day 15)  Alendronate Sodium 70 Mg Tabs (Alendronate sodium) .Marland Kitchen..  1 tab weekly 16)  Ferrous Sulfate 325 (65 Fe) Mg Tabs (Ferrous sulfate) .... Take one by mouth once daily 17)  Amlodipine Besylate 5 Mg Tabs (Amlodipine besylate) .Marland Kitchen.. 1 by mouth once daily 18)  Benazepril Hcl 20 Mg Tabs (Benazepril hcl) .Marland Kitchen.. 1 by mouth once daily 19)  Alendronate Sodium 70 Mg Tabs (Alendronate sodium) .Marland Kitchen.. 1po q wk 20)  Vesicare 5 Mg Tabs (Solifenacin succinate) .Marland Kitchen.. 1po once daily  Patient Instructions: 1)  you do not appear to be dehydrated today 2)  please cut back on fluids as you only need to drink fluids if you are thirst 3)  stop the oxybutinin as you have 4)  start the vesicare 5 mg per day for the overactive bladder 5)  you are given the prescription and the form to fill out for free vesicare (for you to send in) 6)  Please go to the Lab in the basement for your urine tests today 7)  Please call the number on the [facility] Card for results of your testing  8)  please re-start the generic fosamax 9)  Please schedule a follow-up appointment in 3 months with: 10)  CBC w/ Diff prior to visit, ICD-9: 285.9 11)  iron panel  285.9 12)  BMP prior to visit, ICD-9:401.1 Prescriptions: VESICARE 5 MG TABS (SOLIFENACIN SUCCINATE) 1po once daily  #90 x 3   Entered and Authorized by:   Corwin Levins MD   Signed by:   Corwin Levins MD on 03/30/2010   Method used:   Print then Give to Patient   RxID:   [id number] ALENDRONATE SODIUM 70 MG TABS (ALENDRONATE SODIUM) 1po q wk  #12 x 3  Entered and Authorized by:   Corwin Levins MD   Signed by:   Corwin Levins MD on 03/30/2010   Method used:   Print then Give to Patient   RxID:   [id number]    Orders Added: 1)  T-Culture, Urine [[id number]] 2)  TLB-Udip w/ Micro [81001-URINE] 3)  Est. Patient Level IV [60630]

## 2010-05-31 NOTE — Miscellaneous (Signed)
Summary: BONE DENSITY  Clinical Lists Changes  Orders: Added new Test order of T-Bone Densitometry (77080) - Signed Added new Test order of T-Lumbar Vertebral Assessment (77082) - Signed 

## 2010-05-31 NOTE — Progress Notes (Signed)
Summary: Dexilant too expensive   Phone Note Call from Patient   Caller: [phone] [name] DAUGHTER Call For: Dr Jarold Motto Summary of Call: Dexilant is too expensive and samples she got are not really working. Wonders if she can get something else. Initial call taken by: Leanor Kail [facility],  March 21, 2010 1:20 PM  Follow-up for Phone Call        patient states that she is still having some dysphagia. She is getting choked when he has solids. Taking her Dexilant but does not feel it is making a difference and she can not afford the Dexilant wants changed back to Omeprazole.  Follow-up by: Harlow Mares CMA Duncan Dull),  March 21, 2010 1:34 PM  Additional Follow-up for Phone Call Additional follow up Details #1::        barium swallow needed, try prilosec 40 mg/day,generic. Additional Follow-up by: Mardella Layman MD FACG,  March 21, 2010 2:02 PM    Additional Follow-up for Phone Call Additional follow up Details #2::    spoke to the daughter and she will talk to the patient and call back to schedule the barrium Swallow. omeprazole sent to the pharm . Follow-up by: Harlow Mares CMA Duncan Dull),  March 21, 2010 2:16 PM  Additional Follow-up for Phone Call Additional follow up Details #3:: Details for Additional Follow-up Action Taken: pts daughter spoke with pt and she declines to have the Barrium swallow at this time since she is trying to pay off the medical bills she has already this year. She will call back when she is ready to schedule . Additional Follow-up by: Harlow Mares CMA (AAMA),  March 30, 2010 2:40 PM  New/Updated Medications: OMEPRAZOLE 40 MG CPDR (OMEPRAZOLE) take one by mouth once daily Prescriptions: OMEPRAZOLE 40 MG CPDR (OMEPRAZOLE) take one by mouth once daily  #30 x 5   Entered by:   Harlow Mares CMA (AAMA)   Authorized by:   Mardella Layman MD [facility]   Signed by:   Harlow Mares CMA (AAMA) on 03/21/2010   Method used:   [facility]        [facility] [address] [phone]* (retail)       [address]       [place], [place]  [address]       Ph: [phone] or [phone]       Fax: [phone]   RxID:   [id number]

## 2010-05-31 NOTE — Progress Notes (Signed)
Summary: medication Refill  Phone Note Refill Request Message from:  Fax from Pharmacy on November 02, 2009 9:04 AM  Refills Requested: Medication #1:  ZOLPIDEM TARTRATE 10 MG TABS 1 by mouth at bedtime as needed   Dosage confirmed as above?Dosage Confirmed   Last Refilled: 05/03/2009   Notes[facility] [phone] Initial call taken by: Zella Ball Ewing CMA (AAMA),  November 02, 2009 9:04 AM    New/Updated Medications: ZOLPIDEM TARTRATE 10 MG TABS (ZOLPIDEM TARTRATE) 1 by mouth at bedtime as needed Prescriptions: ZOLPIDEM TARTRATE 10 MG TABS (ZOLPIDEM TARTRATE) 1 by mouth at bedtime as needed  #30 x 5   Entered and Authorized by:   Corwin Levins MD   Signed by:   Corwin Levins MD on 11/02/2009   Method used:   Print then Give to Patient   RxID:   [id number]  done hardcopy to LIM side B - dahlia Corwin Levins MD  November 02, 2009 1:05 PM   Rx faxed to pharmacy Margaret Pyle, CMA  November 02, 2009 1:29 PM

## 2010-05-31 NOTE — Letter (Signed)
Summary: Appointment - Missed  [facility], Main Office  [address]   [place], [place] [address]   Phone: [phone]  Fax: [phone]     April 06, 2010 MRN: [id number]   Wendy Torres [address] [place], [place]  [address]   Dear [name],  Our records indicate you missed your appointment on  03/28/10 for your renal ultrasound and 03/29/10   with Dr. Clifton James . It is very important that we reach you to reschedule this appointment. We look forward to participating in your health care needs. Please contact us at the number listed above at your earliest convenience to reschedule this appointment.     Sincerely,    Glass blower/designer

## 2010-05-31 NOTE — Assessment & Plan Note (Signed)
Summary: OV W/SOB COMES AND GOES--NERVENESS-BP--STC   Vital Signs:  Patient profile:   70 year old female Height:      64 inches Weight:      172.50 pounds BMI:     29.72 O2 Sat:      97 % on Room air Temp:     97.9 degrees F oral Pulse rate:   79 / minute BP sitting:   192 / 70  (left arm) Cuff size:   regular  Vitals Entered By: Zella Ball Ewing CMA (AAMA) (January 27, 2010 2:14 PM)  O2 Flow:  Room air  Preventive Care Screening     declines tetanus  CC: SOB, Sinus congestion, stomach pain/RE, Back Pain   Primary Care Provider:  Oliver Barre, MD  CC:  SOB, Sinus congestion, stomach pain/RE, and Back Pain.  History of Present Illness: here for wellness and f/u , also noted elev BP today - similar to what she had at the pain clinc where she had ESI x 1 two wks ago; still with pain to the left knee and thigh as well; also with episodes of sob/palpiations with "vibrations" to the top of the head; Pt denies CP, doe, wheezing, orthopnea, pnd, worsening LE edema, or syncope  Pt denies new neuro symptoms such as headache, facial or extremity weakness  Pt denies polydipsia, polyuria.  Overall good compliance with meds, trying to follow low choll diet, wt stable, little excercise however .  Has also persistent epigastric discomfort despite current PPI with mild dysphagia, no n/v,bowel change or blood.  Has some mild mouth pain as well better with magic mouthwash in the past.  Also with mild nasal allergy symtpoms with clearish drainage but no ST or cough.  Has nerve attacks occasinally as well in the past few months she thinks for no clear reason, denies increased social or other stressors, or depressive symptoms or suicidal ideation.     Problems Prior to Update: 1)  Epigastric Pain  (ICD-789.06) 2)  Dyspnea  (ICD-786.05) 3)  Palpitations, Recurrent  (ICD-785.1) 4)  Preventive Health Care  (ICD-V70.0) 5)  Chronic Pain Syndrome  (ICD-338.4) 6)  Glossitis  (ICD-529.0) 7)  Other Dysphagia   (ICD-787.29) 8)  Anemia  (ICD-285.9) 9)  Epigastric Pain  (ICD-789.06) 10)  Syncope  (ICD-780.2) 11)  Insomnia-sleep Disorder-unspec  (ICD-780.52) 12)  Abnormal Chest Xray  (ICD-793.1) 13)  Hypersomnia  (ICD-780.54) 14)  Overactive Bladder  (ICD-596.51) 15)  Abdominal Pain, Suprapubic  (ICD-789.09) 16)  Abdominal Pain, Lower  (ICD-789.09) 17)  Abdominal Pain, Right Upper Quadrant  (ICD-789.01) 18)  Acute Uris of Unspecified Site  (ICD-465.9) 19)  Hyperlipidemia  (ICD-272.4) 20)  Preventive Health Care  (ICD-V70.0) 21)  Transient Ischemic Attack, Hx of  (ICD-V12.50) 22)  COPD  (ICD-496) 23)  Osteoporosis  (ICD-733.00) 24)  Low Back Pain  (ICD-724.2) 25)  Hypertension  (ICD-401.9) 26)  Gerd  (ICD-530.81) 27)  Asthma  (ICD-493.90) 28)  Allergic Rhinitis  (ICD-477.9) 29)  Degenerative Joint Disease  (ICD-715.90) 30)  Morbid Obesity  (ICD-278.01) 31)  Helicobacter Pylori Gastritis, Hx of  (ICD-V12.79)  Medications Prior to Update: 1)  Lovastatin 40 Mg  Tabs (Lovastatin) .Marland Kitchen.. 1 By Mouth Qd 2)  Amlodipine Besylate 10 Mg Tabs (Amlodipine Besylate) .Marland Kitchen.. 1 By Mouth Qd 3)  Benazepril Hcl 40 Mg Tabs (Benazepril Hcl) .Marland Kitchen.. 1po Once Daily 4)  Fosamax 70 Mg Tabs (Alendronate Sodium) .Marland Kitchen.. 1 By Mouth Q Wk 5)  Hydrocodone-Acetaminophen 5-500 Mg Tabs (Hydrocodone-Acetaminophen) .... Take 1 Tablet  By Mouth Twice A Day 6)  Omeprazole 20 Mg  Cpdr (Omeprazole) .... 2 By Mouth Once Daily 7)  Triamcinolone Acetonide 0.5 % Crea (Triamcinolone Acetonide) .... Use Asd 8)  Neurontin 100 Mg  Caps (Gabapentin) .Marland Kitchen.. 1 Q 8 Hrs. Nerve Pain 9)  Mobic 15 Mg  Tabs (Meloxicam) .Marland Kitchen.. 1 By Mouth Qd 10)  Ecotrin 325 Mg  Tbec (Aspirin) .Marland Kitchen.. 1 By Mouth Qd 11)  Cetirizine Hcl 10 Mg  Tabs (Cetirizine Hcl) .Marland Kitchen.. 1 By Mouth Once Daily Prn 12)  Oxybutynin Chloride 5 Mg Xr24h-Tab (Oxybutynin Chloride) .... 2 By Mouth in The Am 13)  Fluticasone Propionate 50 Mcg/act Susp (Fluticasone Propionate) .... 2 Spray/side Once Daily 14)   Furosemide 20 Mg Tabs (Furosemide) .Marland Kitchen.. 1 By Mouth Every Am 15)  Oxycodone-Acetaminophen 5-325 Mg Tabs (Oxycodone-Acetaminophen) .Marland Kitchen.. 1 By Mouth Every 6-8 Hours For Breakthrough Pain 16)  Vitamin D3 1000 Unit Caps (Cholecalciferol) .Marland Kitchen.. 1 By Mouth Once Daily 17)  Nystatin 100000 Unit/ml Susp (Nystatin) .... 5 Cc By Mouth Swich and Spit Four Times Per Day For 14 Days 18)  Zolpidem Tartrate 10 Mg Tabs (Zolpidem Tartrate) .Marland Kitchen.. 1 By Mouth At Bedtime As Needed 19)  Diflucan 100 Mg Tabs (Fluconazole) .... Generic - 1 By Mouth Once Daily For 7 Days  Current Medications (verified): 1)  Lovastatin 40 Mg  Tabs (Lovastatin) .Marland Kitchen.. 1 By Mouth Qd 2)  Amlodipine Besylate 10 Mg Tabs (Amlodipine Besylate) .Marland Kitchen.. 1 By Mouth Qd 3)  Benazepril Hcl 40 Mg Tabs (Benazepril Hcl) .Marland Kitchen.. 1po Once Daily 4)  Omeprazole 20 Mg  Cpdr (Omeprazole) .... 2 By Mouth Once Daily 5)  Neurontin 100 Mg  Caps (Gabapentin) .Marland Kitchen.. 1 Q 8 Hrs. Nerve Pain 6)  Mobic 15 Mg  Tabs (Meloxicam) .Marland Kitchen.. 1 By Mouth Qd 7)  Ecotrin 325 Mg  Tbec (Aspirin) .Marland Kitchen.. 1 By Mouth Qd 8)  Fexofenadine Hcl 180 Mg Tabs (Fexofenadine Hcl) .Marland Kitchen.. 1po Once Daily As Needed 9)  Oxybutynin Chloride 5 Mg Xr24h-Tab (Oxybutynin Chloride) .... 2 By Mouth in The Am 10)  Fluticasone Propionate 50 Mcg/act Susp (Fluticasone Propionate) .... 2 Spray/side Once Daily 11)  Furosemide 20 Mg Tabs (Furosemide) .Marland Kitchen.. 1 By Mouth Every Am 12)  Oxycodone-Acetaminophen 5-325 Mg Tabs (Oxycodone-Acetaminophen) .Marland Kitchen.. 1 By Mouth Every 6-8 Hours For Breakthrough Pain 13)  Vitamin D3 1000 Unit Caps (Cholecalciferol) .Marland Kitchen.. 1 By Mouth Once Daily 14)  Zolpidem Tartrate 10 Mg Tabs (Zolpidem Tartrate) .Marland Kitchen.. 1 By Mouth At Bedtime As Needed 15)  Alprazolam 0.25 Mg Tabs (Alprazolam) .Marland Kitchen.. 1po Two Times A Day As Needed 16)  Magic Mouthwash .... 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[employer]  Daily Caffeine Use -2 Illicit Drug Use - no  Risk Factors: Smoking Status: quit (04/18/2007)  Past Medical History: Helicobacter pylori gastritis, hx Morbid Obesity Degenerative Joint Disease Allergic rhinitis Asthma GERD Hypertension Low back pain Osteoporosis COPD Transient ischemic attack, hx of/post circulation Hyperlipidemia bilat hip djd - severe  Review of Systems  The patient denies anorexia, fever, vision loss, decreased hearing, hoarseness, chest pain, syncope, dyspnea on exertion, peripheral edema, prolonged cough, headaches, hemoptysis, abdominal pain, melena, hematochezia, severe indigestion/heartburn, hematuria, muscle  weakness, suspicious skin lesions, transient blindness, difficulty walking, depression, unusual weight change, abnormal bleeding, enlarged lymph nodes, and angioedema.         all otherwise negative per pt -    Physical Exam  General:  alert and overweight-appearing.   Head:  normocephalic and atraumatic.   Eyes:  vision grossly intact, pupils equal, and pupils round.   Ears:  R ear normal and L ear normal.   Nose:  no external deformity and no nasal discharge.   Mouth:  no gingival abnormalities and pharynx pink and moist.  , tongue with whitish coating  Neck:  supple and no masses.   Lungs:  normal respiratory effort and normal breath sounds.   Heart:  normal rate and regular rhythm.   Abdomen:  soft,  and normal bowel sounds.  wtih tender epigastric wthiout guarding or rebound Msk:  normal ROM, no joint tenderness, and no joint swelling.   Extremities:  no edema, no erythema  Neurologic:  cranial nerves II-XII intact and strength normal in all extremities.   Skin:  color normal and no  rashes.   Psych:  not depressed appearing and moderately anxious.     Impression & Recommendations:  Problem # 1:  PREVENTIVE HEALTH CARE (ICD-V70.0)  Overall doing well, age appropriate education and counseling updated and referral for appropriate preventive services done unless declined, immunizations up to date or declined, diet counseling done if overweight, urged to quit smoking if smokes , most recent labs reviewed and current ordered if appropriate, ecg reviewed or declined (interpretation per ECG scanned in the EMR if done); information regarding Medicare Prevention requirements given if appropriate; speciality referrals updated as appropriate   Orders: TLB-BMP (Basic Metabolic Panel-BMET) (80048-METABOL) TLB-CBC Platelet - w/Differential (85025-CBCD) TLB-Hepatic/Liver Function Pnl (80076-HEPATIC) TLB-TSH (Thyroid Stimulating Hormone) (84443-TSH) TLB-Udip ONLY (81003-UDIP)  Problem # 2:  PALPITATIONS, RECURRENT (ICD-785.1)  ? SVT vs anxiety related, recent ekg from echart reviewed;  for xanax as needed trial , refer card - ? for event monitor, also check TSH, avoid caffeine  Orders: Cardiology Referral (Cardiology)  Problem # 3:  DYSPNEA (ICD-786.05)  recent cxr from echart reviewed;  for echo as well as above labs  Orders: Echo Referral (Echo)  Problem # 4:  EPIGASTRIC PAIN (ICD-789.06)  with recent worseing mild dypshagia, s/p egd yrs ago - for GI referral and abd u/s - r/o gb, check h pylori ab, cont PPI as is  Orders: Radiology Referral (Radiology) Gastroenterology Referral (GI) TLB-H. Pylori Abs(Helicobacter Pylori) (86677-HELICO)  Problem # 5:  GLOSSITIS (ICD-529.0) for magic mouthwash as needed   Problem # 6:  ALLERGIC RHINITIS (ICD-477.9)  Her updated medication list for this problem includes:    Fexofenadine Hcl 180 Mg Tabs (Fexofenadine hcl) .Marland Kitchen... 1po once daily as needed    Fluticasone Propionate 50 Mcg/act Susp (Fluticasone propionate) .Marland Kitchen... 2  spray/side once daily treat as above, f/u any worsening signs or symptoms   Problem # 7:  HYPERTENSION (ICD-401.9)  Her updated medication list for this problem includes:    Amlodipine Besylate 10 Mg Tabs (Amlodipine besylate) .Marland Kitchen... 1 by mouth qd    Benazepril Hcl 40 Mg Tabs (Benazepril hcl) .Marland Kitchen... 1po once daily    Furosemide 20 Mg Tabs (Furosemide) .Marland Kitchen... 1 by mouth every am    Clonidine Hcl 0.1 Mg Tabs (Clonidine hcl) .Marland Kitchen... 1 by mouth two times a day  BP today: 192/70 Prior BP: 150/86 (02/16/2009)  Labs Reviewed: K+: 4.3 (11/30/2008) Creat: : 1.0 (11/30/2008)  Chol: 155 (11/30/2008)   HDL: 45.60 (11/30/2008)   LDL: 88 (11/30/2008)   TG: 105.0 (11/30/2008) uncontrolled, to add clonidine .1 two times a day   Complete Medication List: 1)  Lovastatin 40 Mg Tabs (Lovastatin) .Marland Kitchen.. 1 by mouth qd 2)  Amlodipine Besylate 10 Mg Tabs (Amlodipine besylate) .Marland Kitchen.. 1 by mouth qd 3)  Benazepril Hcl 40 Mg Tabs (Benazepril hcl) .Marland Kitchen.. 1po once daily 4)  Omeprazole 20 Mg Cpdr (Omeprazole) .... 2 by mouth once daily 5)  Neurontin 100 Mg Caps (Gabapentin) .Marland Kitchen.. 1 q 8 hrs. nerve pain 6)  Mobic 15 Mg Tabs (Meloxicam) .Marland Kitchen.. 1 by mouth qd 7)  Ecotrin 325 Mg Tbec (Aspirin) .Marland Kitchen.. 1 by mouth qd 8)  Fexofenadine Hcl 180 Mg Tabs (Fexofenadine hcl) .Marland Kitchen.. 1po once daily as needed 9)  Oxybutynin Chloride 5 Mg Xr24h-tab (Oxybutynin chloride) .... 2 by mouth in the am 10)  Fluticasone Propionate 50 Mcg/act Susp (Fluticasone propionate) .... 2 spray/side once daily 11)  Furosemide 20 Mg Tabs (Furosemide) .Marland Kitchen.. 1 by mouth every am 12)  Oxycodone-acetaminophen 5-325 Mg Tabs (Oxycodone-acetaminophen) .Marland Kitchen.. 1 by mouth every 6-8 hours for breakthrough pain 13)  Vitamin D3 1000 Unit Caps (Cholecalciferol) .Marland Kitchen.. 1 by mouth once daily 14)  Zolpidem Tartrate 10 Mg Tabs (Zolpidem tartrate) .Marland Kitchen.. 1 by mouth at bedtime as needed 15)  Alprazolam 0.25 Mg Tabs (Alprazolam) .Marland Kitchen.. 1po two times a day as needed 16)  Magic Mouthwash  .... Use  asd - 5 cc by mouth swish and spit four times per day as needed 17)  Clonidine Hcl 0.1 Mg Tabs (Clonidine hcl) .Marland Kitchen.. 1 by mouth two times a day  Other Orders: Flu Vaccine 80yrs + MEDICARE PATIENTS (E4540) Administration Flu vaccine - MCR (J8119)  Patient Instructions: 1)  you had the flu shot today 2)  please call for your yearly mammogram - consider [facility] on [address], or [facility] on [address] 3)  Please go to the Lab in the basement for your blood and/or urine tests today 4)  You will be contacted about the referral(s) to: cardiology, and GI (Dr Jarold Motto), echocardiogram and gallbladder ultrasound 5)  Please take all new medications as prescribed - magic mouthwash, and generic xanax as needed , and generic allegra for allergies, adn clonidine for blood pressure 6)  Continue all previous medications as before this visit, except stop the cetirizine (generic for zyrtec) 7)  Please schedule a follow-up appointment in 6 months. Prescriptions: CLONIDINE HCL 0.1 MG TABS (CLONIDINE HCL) 1 by mouth two times a day  #60 x 11   Entered and Authorized by:   Corwin Levins MD   Signed by:   Corwin Levins MD on 01/27/2010   Method used:   [facility]        [facility] [address] [phone]* (retail)       [address]       [place], [place]  [address]       Ph: [phone] or [phone]       Fax: [phone]   RxID:   [id number] OXYBUTYNIN CHLORIDE 5 MG XR24H-TAB (OXYBUTYNIN CHLORIDE) 2 by mouth in the AM  #180 x 3   Entered and Authorized by:   Corwin Levins MD   Signed by:   Corwin Levins MD on 01/27/2010   Method used:   [facility]ronically to        [facility] [address] [phone]* (retail)  [address]       [place], [place]  [address]       Ph: [phone] or [phone]       Fax: [phone]   RxID:   [id number] MAGIC MOUTHWASH use asd - 5 cc by mouth swish and spit four times per day as needed  #240cc x 1   Entered and  Authorized by:   Corwin Levins MD   Signed by:   Corwin Levins MD on 01/27/2010   Method used:   Print then Give to Patient   RxID:   [id number] ALPRAZOLAM 0.25 MG TABS (ALPRAZOLAM) 1po two times a day as needed  #60 x 2   Entered and Authorized by:   Corwin Levins MD   Signed by:   Corwin Levins MD on 01/27/2010   Method used:   Print then Give to Patient   RxID:   [id number] FEXOFENADINE HCL 180 MG TABS (FEXOFENADINE HCL) 1po once daily as needed  #90 x 3   Entered and Authorized by:   Corwin Levins MD   Signed by:   Corwin Levins MD on 01/27/2010   Method used:   Print then Give to Patient   RxID:   [id number]      Flu Vaccine Consent Questions     Do you have a history of severe allergic reactions to this vaccine? no    Any prior history of allergic reactions to egg and/or gelatin? no    Do you have a sensitivity to the preservative Thimersol? no    Do you have a past history of Guillan-Barre Syndrome? no    Do you currently have an acute febrile illness? no    Have you ever had a severe reaction to latex? no    Vaccine information given and explained to patient? yes    Are you currently pregnant? no    Lot Number:[id number]   Exp Date:10/29/2010   Site Given  Left Deltoid IMlu

## 2010-05-31 NOTE — Progress Notes (Signed)
Summary: scheduling request   Phone Note Call from Patient Call back at [phone]   Caller: Daughter, Wendy Torres Call For: Dr. Jarold Motto Reason for Call: Talk to Nurse Summary of Call: pt would like to be seen sooner b/c the "white stuff" on her tongue is really bothering her Initial call taken by: Vallarie Mare,  February 18, 2010 10:25 AM  Follow-up for Phone Call        Dr Jonny Ruiz has been treating the patient for thrush. Was given nystatin in September.  Daughter states that this is not heping.   I have asked them to contact his office.  If they still need an appointment they will call back .  Follow-up by: Darcey Nora RN, CGRN,  February 18, 2010 10:46 AM  Additional Follow-up for Phone Call Additional follow up Details #1::        This is a chronic problem for this patient without any definite diagnosis being determined. Additional Follow-up by: Mardella Layman MD [facility],  February 18, 2010 11:00 AM

## 2010-05-31 NOTE — Assessment & Plan Note (Signed)
Summary: PER PATTY SCHED---STC   Vital Signs:  Patient profile:   70 year old female Height:      64 inches Weight:      173.13 pounds O2 Sat:      95 % Temp:     98.2 degrees F Pulse rate:   53 / minute BP sitting:   192 / 90  (left arm)  Vitals Entered By: patty berrocal, sma CC: high blood pressure   Primary Care Provider:  Oliver Barre, MD  CC:  high blood pressure.  History of Present Illness: here to f/u - BP at cardiologist recently elev, and despite our best efforts as documented in the EMR, on questioning pt now states she is only taking the clonidine, and somehow thought that was all she was supposed to take.  Also not taking the lovastatin as she has had some difficulty with getting from the pharmacy, even with the help of the daughter with her today. Pt without signficant worsening hearing or cognitive declines;  Pt denies CP, worsening sob, doe, wheezing, orthopnea, pnd, worsening LE edema, palps, dizziness or syncope  Pt denies new neuro symptoms such as headache, facial or extremity weakness  Pt denies polydipsia, polyuria, or low sugar symptoms  Overall  trying to follow low chol diet, wt stable, little excercise however No fever, wt loss, night sweats, loss of appetite or other constitutional symptoms  No other new complaints Denies worsening depressive symptoms, suicidal ideation, or panic.  No fever, sT, cough or wheezing.  Problems Prior to Update: 1)  Epigastric Pain  (ICD-789.06) 2)  Dyspnea  (ICD-786.05) 3)  Palpitations, Recurrent  (ICD-785.1) 4)  Preventive Health Care  (ICD-V70.0) 5)  Chronic Pain Syndrome  (ICD-338.4) 6)  Glossitis  (ICD-529.0) 7)  Other Dysphagia  (ICD-787.29) 8)  Anemia  (ICD-285.9) 9)  Epigastric Pain  (ICD-789.06) 10)  Syncope  (ICD-780.2) 11)  Insomnia-sleep Disorder-unspec  (ICD-780.52) 12)  Abnormal Chest Xray  (ICD-793.1) 13)  Hypersomnia  (ICD-780.54) 14)  Overactive Bladder  (ICD-596.51) 15)  Abdominal Pain, Suprapubic   (ICD-789.09) 16)  Abdominal Pain, Lower  (ICD-789.09) 17)  Abdominal Pain, Right Upper Quadrant  (ICD-789.01) 18)  Acute Uris of Unspecified Site  (ICD-465.9) 19)  Hyperlipidemia  (ICD-272.4) 20)  Preventive Health Care  (ICD-V70.0) 21)  Transient Ischemic Attack, Hx of  (ICD-V12.50) 22)  COPD  (ICD-496) 23)  Osteoporosis  (ICD-733.00) 24)  Low Back Pain  (ICD-724.2) 25)  Hypertension  (ICD-401.9) 26)  Gerd  (ICD-530.81) 27)  Asthma  (ICD-493.90) 28)  Allergic Rhinitis  (ICD-477.9) 29)  Degenerative Joint Disease  (ICD-715.90) 30)  Morbid Obesity  (ICD-278.01) 31)  Helicobacter Pylori Gastritis, Hx of  (ICD-V12.79)  Medications Prior to Update: 1)  Lovastatin 40 Mg  Tabs (Lovastatin) .Marland Kitchen.. 1 By Mouth Qd 2)  Amlodipine Besylate 10 Mg Tabs (Amlodipine Besylate) .Marland Kitchen.. 1 By Mouth Qd 3)  Benazepril Hcl 40 Mg Tabs (Benazepril Hcl) .Marland Kitchen.. 1po Once Daily 4)  Omeprazole 20 Mg  Cpdr (Omeprazole) .... 2 By Mouth Once Daily 5)  Neurontin 100 Mg  Caps (Gabapentin) .Marland Kitchen.. 1 Q 8 Hrs. Nerve Pain 6)  Mobic 15 Mg  Tabs (Meloxicam) .Marland Kitchen.. 1 By Mouth Qd 7)  Ecotrin 325 Mg  Tbec (Aspirin) .Marland Kitchen.. 1 By Mouth Qd 8)  Fexofenadine Hcl 180 Mg Tabs (Fexofenadine Hcl) .Marland Kitchen.. 1po Once Daily As Needed 9)  Oxybutynin Chloride 5 Mg Xr24h-Tab (Oxybutynin Chloride) .Marland Kitchen.. 1 Tab Once Daily 10)  Fluticasone Propionate 50 Mcg/act Susp (Fluticasone Propionate) .Marland KitchenMarland KitchenMarland Kitchen  2 Spray/side Once Daily 11)  Furosemide 20 Mg Tabs (Furosemide) .Marland Kitchen.. 1 By Mouth Every Am 12)  Oxycodone-Acetaminophen 5-325 Mg Tabs (Oxycodone-Acetaminophen) .Marland Kitchen.. 1 By Mouth Every 6-8 Hours For Breakthrough Pain 13)  Vitamin D3 1000 Unit Caps (Cholecalciferol) .Marland Kitchen.. 1 By Mouth Once Daily 14)  Zolpidem Tartrate 10 Mg Tabs (Zolpidem Tartrate) .Marland Kitchen.. 1 By Mouth At Bedtime As Needed 15)  Alprazolam 0.25 Mg Tabs (Alprazolam) .Marland Kitchen.. 1po Two Times A Day As Needed 16)  Magic Mouthwash .... Use Asd - 5 Cc By Mouth Swish and Spit Four Times Per Day As Needed 17)  Clonidine Hcl 0.2 Mg  Tabs (Clonidine Hcl) .... Take One Tablet By Mouth Twice A Day 18)  Alendronate Sodium 70 Mg Tabs (Alendronate Sodium) .Marland Kitchen.. 1 Tab Weekly  Current Medications (verified): 1)  Lovastatin 40 Mg  Tabs (Lovastatin) .Marland Kitchen.. 1 By Mouth Once Daily 2)  Amlodipine Besy-Benazepril Hcl 5-20 Mg Caps (Amlodipine Besy-Benazepril Hcl) .... 2 By Mouth in The Am 3)  Omeprazole 20 Mg  Cpdr (Omeprazole) .... 2 By Mouth Once Daily 4)  Neurontin 100 Mg  Caps (Gabapentin) .Marland Kitchen.. 1 Q 8 Hrs. Nerve Pain 5)  Mobic 15 Mg  Tabs (Meloxicam) .Marland Kitchen.. 1 By Mouth Qd 6)  Ecotrin 325 Mg  Tbec (Aspirin) .Marland Kitchen.. 1 By Mouth Qd 7)  Fexofenadine Hcl 180 Mg Tabs (Fexofenadine Hcl) .Marland Kitchen.. 1po Once Daily As Needed 8)  Oxybutynin Chloride 5 Mg Xr24h-Tab (Oxybutynin Chloride) .Marland Kitchen.. 1 Tab Once Daily 9)  Fluticasone Propionate 50 Mcg/act Susp (Fluticasone Propionate) .... 2 Spray/side Once Daily 10)  Furosemide 20 Mg Tabs (Furosemide) .Marland Kitchen.. 1 By Mouth Every Am 11)  Oxycodone-Acetaminophen 5-325 Mg Tabs (Oxycodone-Acetaminophen) .Marland Kitchen.. 1 By Mouth Every 6-8 Hours For Breakthrough Pain 12)  Vitamin D3 1000 Unit Caps (Cholecalciferol) .Marland Kitchen.. 1 By Mouth Once Daily 13)  Zolpidem Tartrate 10 Mg Tabs (Zolpidem Tartrate) .Marland Kitchen.. 1 By Mouth At Bedtime As Needed 14)  Alprazolam 0.25 Mg Tabs (Alprazolam) .Marland Kitchen.. 1po Two Times A Day As Needed 15)  Magic Mouthwash .... Use Asd - 5 Cc By Mouth Swish and Spit Four Times Per Day As Needed 16)  Clonidine Hcl 0.2 Mg Tabs (Clonidine Hcl) .... Take One Tablet By Mouth Twice A Day 17)  Alendronate Sodium 70 Mg Tabs (Alendronate Sodium) .Marland Kitchen.. 1 Tab Weekly  Allergies (verified): No Known Drug Allergies  Past History:  Past Surgical History: Last updated: 02/16/2009 s/p lumbar disc surgury Tubal Ligation  Social History: Last updated: 02/16/2010 Former Smoker (smoked 1/2 ppd for 30 years)-none for 25 years Alcohol use-no Occupation:[employer]  Married, 10 children (8 girls/2 boys) Daily Caffeine Use -2 cups coffe Illicit Drug  Use - no  Risk Factors: Smoking Status: quit (04/18/2007)  Past Medical History: Helicobacter pylori gastritis, hx Morbid Obesity Degenerative Joint Disease Allergic rhinitis Asthma GERD Hypertension Low back pain Osteoporosis COPD Transient ischemic attack, hx of/post circulation Hyperlipidemia bilat hip djd - severe  Review of Systems       all otherwise negative per pt -    Physical Exam  General:  alert and overweight-appearing.   Head:  normocephalic and atraumatic.   Eyes:  vision grossly intact, pupils equal, and pupils round.   Ears:  R ear normal and L ear normal.   Nose:  no external deformity and no nasal discharge.   Mouth:  no gingival abnormalities and pharynx pink and moist.   Neck:  supple and no masses.   Lungs:  normal respiratory effort and normal breath sounds.  Heart:  normal rate and regular rhythm.   Abdomen:  soft, non-tender, and normal bowel sounds.   Extremities:  no edema, no erythema  Neurologic:  alert & oriented X3, strength normal in all extremities, gait normal, and DTRs symmetrical and normal.   Psych:  not depressed appearing and moderately anxious.     Impression & Recommendations:  Problem # 1:  HYPERTENSION (ICD-401.9)  The following medications were removed from the medication list:    Benazepril Hcl 40 Mg Tabs (Benazepril hcl) .Marland Kitchen... 1po once daily Her updated medication list for this problem includes:    Amlodipine Besy-benazepril Hcl 5-20 Mg Caps (Amlodipine besy-benazepril hcl) .Marland Kitchen... 2 by mouth in the am    Furosemide 20 Mg Tabs (Furosemide) .Marland Kitchen... 1 by mouth every am    Clonidine Hcl 0.2 Mg Tabs (Clonidine hcl) .Marland Kitchen... Take one tablet by mouth twice a day  BP today: 192/90 Prior BP: 182/86 (02/16/2010)  Labs Reviewed: K+: 3.9 (01/27/2010) Creat: : 0.8 (01/27/2010)   Chol: 155 (11/30/2008)   HDL: 45.60 (11/30/2008)   LDL: 88 (11/30/2008)   TG: 105.0 (11/30/2008) to re-start meds,f/u BP at home and next visit  Problem  # 2:  HYPERLIPIDEMIA (ICD-272.4)  Her updated medication list for this problem includes:    Lovastatin 40 Mg Tabs (Lovastatin) .Marland Kitchen... 1 by mouth once daily  Labs Reviewed: SGOT: 23 (01/27/2010)   SGPT: 16 (01/27/2010)   HDL:45.60 (11/30/2008), 48.9 (04/18/2007)  LDL:88 (11/30/2008), 129 (16/01/9603)  Chol:155 (11/30/2008), 193 (04/18/2007)  Trig:105.0 (11/30/2008), 78 (04/18/2007) to re-start med, Pt to continue diet efforts, good med tolerance previously - goal LDL less than 7- with hx of TIA  Problem # 3:  TRANSIENT ISCHEMIC ATTACK, HX OF (ICD-V12.50) stable overall by hx and exam, ok to continue meds/tx as is , cont same meds, including the asa 325 for now;  tried to emphasize with pt the importance of complaince with meds and instructions to reduce risk of stroke and MI and death; daughter states will help facilitate meds at home  Problem # 4:  COPD (ICD-496) stable overall by hx and exam, ok to continue meds/tx as is   Complete Medication List: 1)  Lovastatin 40 Mg Tabs (Lovastatin) .Marland Kitchen.. 1 by mouth once daily 2)  Amlodipine Besy-benazepril Hcl 5-20 Mg Caps (Amlodipine besy-benazepril hcl) .... 2 by mouth in the am 3)  Omeprazole 20 Mg Cpdr (Omeprazole) .... 2 by mouth once daily 4)  Neurontin 100 Mg Caps (Gabapentin) .Marland Kitchen.. 1 q 8 hrs. nerve pain 5)  Mobic 15 Mg Tabs (Meloxicam) .Marland Kitchen.. 1 by mouth qd 6)  Ecotrin 325 Mg Tbec (Aspirin) .Marland Kitchen.. 1 by mouth qd 7)  Fexofenadine Hcl 180 Mg Tabs (Fexofenadine hcl) .Marland Kitchen.. 1po once daily as needed 8)  Oxybutynin Chloride 5 Mg Xr24h-tab (Oxybutynin chloride) .Marland Kitchen.. 1 tab once daily 9)  Fluticasone Propionate 50 Mcg/act Susp (Fluticasone propionate) .... 2 spray/side once daily 10)  Furosemide 20 Mg Tabs (Furosemide) .Marland Kitchen.. 1 by mouth every am 11)  Oxycodone-acetaminophen 5-325 Mg Tabs (Oxycodone-acetaminophen) .Marland Kitchen.. 1 by mouth every 6-8 hours for breakthrough pain 12)  Vitamin D3 1000 Unit Caps (Cholecalciferol) .Marland Kitchen.. 1 by mouth once daily 13)  Zolpidem  Tartrate 10 Mg Tabs (Zolpidem tartrate) .Marland Kitchen.. 1 by mouth at bedtime as needed 14)  Alprazolam 0.25 Mg Tabs (Alprazolam) .Marland Kitchen.. 1po two times a day as needed 15)  Magic Mouthwash  .... Use asd - 5 cc by mouth swish and spit four times per day as needed 16)  Clonidine Hcl  0.2 Mg Tabs (Clonidine hcl) .... Take one tablet by mouth twice a day 17)  Alendronate Sodium 70 Mg Tabs (Alendronate sodium) .Marland Kitchen.. 1 tab weekly  Patient Instructions: 1)  please re-start the Blood pressure medicine as we discussed 2)  Please go over your medication list at home as well, and make sure your medications at home match with the current list (call [phone] if the meds you have at hoome are different from the list) 3)  Please schedule a follow-up appointment in 1 month. Prescriptions: AMLODIPINE BESY-BENAZEPRIL HCL 5-20 MG CAPS (AMLODIPINE BESY-BENAZEPRIL HCL) 2 by mouth in the AM  #180 x 3   Entered and Authorized by:   Corwin Levins MD   Signed by:   Corwin Levins MD on 02/24/2010   Method used:   Print then Give to Patient   RxID:   [id number] LOVASTATIN 40 MG  TABS (LOVASTATIN) 1 by mouth once daily  #90 x 3   Entered and Authorized by:   Corwin Levins MD   Signed by:   Corwin Levins MD on 02/24/2010   Method used:   Print then Give to Patient   RxID:   [id number]    Orders Added: 1)  Est. Patient Level IV [84696]

## 2010-05-31 NOTE — Progress Notes (Signed)
Summary: Iron pills not covered by insurance   Phone Note Call from Patient   Caller: Rinaldo Cloud daughter [phone] Call For: Dr Jarold Motto Summary of Call: Insurance does not want to cover iron pills and they an not afford the $25. Is there another? Initial call taken by: Leanor Kail [facility],  March 02, 2010 12:48 PM  Follow-up for Phone Call        ferrous sulfate rx sent Follow-up by: Harlow Mares CMA (AAMA),  March 02, 2010 1:23 PM    New/Updated Medications: FERROUS SULFATE 325 (65 FE) MG TABS (FERROUS SULFATE) take one by mouth once daily Prescriptions: FERROUS SULFATE 325 (65 FE) MG TABS (FERROUS SULFATE) take one by mouth once daily  #30 x 6   Entered by:   Harlow Mares CMA (AAMA)   Authorized by:   Mardella Layman MD [facility]   Signed by:   Harlow Mares CMA (AAMA) on 03/02/2010   Method used:   [facility]        [facility]  [address] [phone]* (retail)       [address]       [place], [place]  [address]       Ph: [phone] or [phone]       Fax: [phone]   RxID:   [id number]

## 2010-05-31 NOTE — Letter (Signed)
Summary: [facility]  [facility]   Imported By: Sherian Rein 11/25/2009 09:41:15  _____________________________________________________________________  External Attachment:    Type:   Image     Comment:   External Document

## 2010-05-31 NOTE — Letter (Signed)
Summary: [facility]   Imported By: Sherian Rein 11/12/2009 11:07:45  _____________________________________________________________________  External Attachment:    Type:   Image     Comment:   External Document

## 2010-05-31 NOTE — Progress Notes (Signed)
Summary: Prolia   Phone Note Outgoing Call   Summary of Call: faxed Prolia ins verification request today.Marland Kitchen..Marland Kitchenwill wait for decision Initial call taken by: Lanier Prude, [facility]),  February 15, 2010 3:32 PM  Follow-up for Phone Call        rec fax back from [employer] stating the cost for pt will be 20 % which is around $180.00 Follow-up by: Lanier Prude, [facility]),  February 22, 2010 11:51 AM  Additional Follow-up for Phone Call Additional follow up Details #1::        pt informed of above.  She states she needs to discuss this with her daughter and she will call us back when she decides to have Prolia inj. Additional Follow-up by: Lanier Prude, [facility]  February 22, 2010 11:58 AM    Additional Follow-up for Phone Call Additional follow up Details #2::    noted Follow-up by: Corwin Levins MD,  February 22, 2010 12:53 PM

## 2010-05-31 NOTE — Procedures (Signed)
Summary: Upper Endoscopy  Patient: Wendy Torres Note: All result statuses are Final unless otherwise noted.  Tests: (1) Upper Endoscopy (EGD)   EGD Upper Endoscopy       DONE      [facility]     [address]     [place], [place]  [address]           ENDOSCOPY PROCEDURE REPORT           PATIENT:  Wendy Torres, Wendy Torres  MR#:  [id number]     BIRTHDATE:  08-01-1940, 69 yrs. old  GENDER:  female           ENDOSCOPIST:  Vania Rea. Jarold Motto, MD, [facility]     Referred by:  Oliver Barre, M.D.           PROCEDURE DATE:  03/11/2010     PROCEDURE:  Elease Hashimoto Dilation of Esophagus, EGD, diagnostic 43235     ASA CLASS:  Class II     INDICATIONS:  dysphagia LOW 9% IRON SATURATION.           MEDICATIONS:   Fentanyl 50 mcg IV, Versed 2 mg IV     TOPICAL ANESTHETIC:  Exactacain Spray           DESCRIPTION OF PROCEDURE:   After the risks benefits and     alternatives of the procedure were thoroughly explained, informed     consent was obtained.  The LB GIF-H180 [id number] endoscope was     introduced through the mouth and advanced to the second portion of     the duodenum, without limitations.  The instrument was slowly     withdrawn as the mucosa was fully examined.     <<PROCEDUREIMAGES>>           The upper, middle, and distal third of the esophagus were     carefully inspected and no abnormalities were noted. The z-line     was well seen at the GEJ. The endoscope was pushed into the fundus     which was normal including a retroflexed view. The antrum,gastric     body, first and second part of the duodenum were unremarkable.     SMALL CYST IN PROXIMAL ESOPHAGUS.INCIDENTAL FINDING.NO     OBSTRUCTION.DILATED #43F MALONEY DILATOR.PASSED EASILY.HX. HERE OF     CHRONIC GERD AND PROBABLE OCCULT STRICTURE DILATED.    Retroflexed     views revealed no abnormalities.    The scope was then withdrawn     from the patient and the procedure completed.           COMPLICATIONS:  None           ENDOSCOPIC  IMPRESSION:     1) Normal EGD     ?? OCCULT STRICTURE DILATED FROM CHRONIC GERD.     RECOMMENDATIONS:     1) Clear liquids until, then soft foods rest iof day. Resume     prior diet tomorrow.     2) continue current medications     3) My office will schedule a colonoscopy           REPEAT EXAM:  No           ______________________________     Vania Rea. Jarold Motto, MD, [facility]           CC:           n.     eSIGNED:   Vania Rea. Patterson at 03/11/2010 03:23 PM  Wendy Torres, Wendy Torres, [id number]  Note: An exclamation mark (!) indicates a result that was not dispersed into the flowsheet. Document Creation Date: 03/11/2010 3:24 PM _______________________________________________________________________  (1) Order result status: Final Collection or observation date-time: 03/11/2010 15:14 Requested date-time:  Receipt date-time:  Reported date-time:  Referring Physician:   Ordering Physician: Sheryn Bison [phone]) Specimen Source:  Source: Launa Grill Order Number: [id number] Lab site:

## 2010-06-02 NOTE — Progress Notes (Signed)
Summary: medication refill  Phone Note Refill Request Message from:  Fax from Pharmacy on May 27, 2010 2:10 PM  Refills Requested: Medication #1:  ZOLPIDEM TARTRATE 10 MG TABS 1 by mouth at bedtime as needed   Dosage confirmed as above?Dosage Confirmed   Last Refilled: 11/02/2009   [facility] [phone] Initial call taken by: Zella Ball Ewing CMA (AAMA),  May 27, 2010 2:10 PM    New/Updated Medications: ZOLPIDEM TARTRATE 10 MG TABS (ZOLPIDEM TARTRATE) 1 by mouth at bedtime as needed Prescriptions: ZOLPIDEM TARTRATE 10 MG TABS (ZOLPIDEM TARTRATE) 1 by mouth at bedtime as needed  #30 x 5   Entered and Authorized by:   Corwin Levins MD   Signed by:   Corwin Levins MD on 05/27/2010   Method used:   Print then Give to Patient   RxID:   [id number]  done hardcopy to LIM side B - dahlia Corwin Levins MD  May 27, 2010 5:38 PM   Rx faxed to pharmacy Margaret Pyle, CMA  May 28, 2010 12:53 PM

## 2010-06-02 NOTE — Letter (Signed)
Summary: [facility]  [facility]   Imported By: Lennie Odor 05/25/2010 14:35:22  _____________________________________________________________________  External Attachment:    Type:   Image     Comment:   External Document

## 2010-06-02 NOTE — Progress Notes (Signed)
Summary: ALT med/JWJ pt  Phone Note Call from Patient Call back at Home Phone [phone]   Caller: Daughter Sheralyn Boatman [phone] Summary of Call: Pt's daughter called stating has been having increasingly severe abdominal cramps since starting Vesicare. Pt recently stopped medications (3 days) and pain has began to decrease. Daughter is requesting an alternative medication for pt, please advise Initial call taken by: Margaret Pyle, CMA,  May 17, 2010 1:19 PM  Follow-up for Phone Call        stop vesicare and start enablex Follow-up by: Newt Lukes MD,  May 17, 2010 4:42 PM  Additional Follow-up for Phone Call Additional follow up Details #1::        Pt's daughter informed Additional Follow-up by: Margaret Pyle, CMA,  May 18, 2010 8:15 AM    New/Updated Medications: ENABLEX 7.5 MG XR24H-TAB (DARIFENACIN HYDROBROMIDE) 1 by mouth once daily Prescriptions: ENABLEX 7.5 MG XR24H-TAB (DARIFENACIN HYDROBROMIDE) 1 by mouth once daily  #30 x 3   Entered and Authorized by:   Newt Lukes MD   Signed by:   Newt Lukes MD on 05/17/2010   Method used:   [facility]        [facility]  [address] [phone]* (retail)       [address]       [place], [place]  [address]       Ph: [phone] or [phone]       Fax: [phone]   RxID:   [id number]

## 2010-06-08 ENCOUNTER — Other Ambulatory Visit: Payer: Self-pay | Admitting: Anesthesiology

## 2010-06-08 ENCOUNTER — Ambulatory Visit
Admission: RE | Admit: 2010-06-08 | Discharge: 2010-06-08 | Disposition: A | Discharge status: Home / Self Care | Payer: Medicare Other | Source: Ambulatory Visit | Attending: Anesthesiology | Admitting: Anesthesiology

## 2010-06-08 DIAGNOSIS — R52 Pain, unspecified: Secondary | ICD-10-CM

## 2010-06-27 ENCOUNTER — Telehealth: Payer: Self-pay | Admitting: Internal Medicine

## 2010-07-07 NOTE — Progress Notes (Signed)
Summary: Pt?  Phone Note Call from Patient Call back at [facility] Phone [phone]   Caller: Daughter Sheralyn Boatman [phone] Summary of Call: Pt's daughter called on behalf of pt to ask MD if taking Flax Seed Oil tablets would be okay with Rx medications? Initial call taken by: Margaret Pyle, CMA,  June 27, 2010 12:55 PM  Follow-up for Phone Call        ok Follow-up by: Corwin Levins MD,  June 27, 2010 1:12 PM  Additional Follow-up for Phone Call Additional follow up Details #1::        Pt's daughter advised Additional Follow-up by: Margaret Pyle, CMA,  June 27, 2010 2:14 PM

## 2010-07-14 LAB — POCT I-STAT, CHEM 8
BUN: 8 mg/dL (ref 6–23)
Calcium, Ion: 1.26 mmol/L (ref 1.12–1.32)
Chloride: 105 mEq/L (ref 96–112)
Creatinine, Ser: 0.8 mg/dL (ref 0.4–1.2)
Sodium: 141 mEq/L (ref 135–145)

## 2010-07-14 LAB — POCT CARDIAC MARKERS
Myoglobin, poc: 115 ng/mL (ref 12–200)
Troponin i, poc: <0.05 ng/mL (ref 0.00–0.09)

## 2010-07-19 ENCOUNTER — Other Ambulatory Visit: Payer: Self-pay

## 2010-07-26 ENCOUNTER — Ambulatory Visit: Payer: Self-pay | Admitting: Internal Medicine

## 2010-08-08 LAB — CBC
MCHC: 33.6 g/dL (ref 30.0–36.0)
MCV: 83.8 fL (ref 78.0–100.0)
Platelets: 276 10*3/uL (ref 150–400)
Platelets: 296 10*3/uL (ref 150–400)
RBC: 4.08 MIL/uL (ref 3.87–5.11)
RBC: 4.34 MIL/uL (ref 3.87–5.11)
WBC: 5.6 10*3/uL (ref 4.0–10.5)
WBC: 6.5 10*3/uL (ref 4.0–10.5)

## 2010-08-08 LAB — DIFFERENTIAL
Lymphs Abs: 2.5 10*3/uL (ref 0.7–4.0)
Monocytes Relative: 12 % (ref 3–12)
Neutro Abs: 3.1 10*3/uL (ref 1.7–7.7)
Neutrophils Relative %: 47 % (ref 43–77)

## 2010-08-08 LAB — MAGNESIUM: Magnesium: 2.1 mg/dL (ref 1.5–2.5)

## 2010-08-08 LAB — POCT CARDIAC MARKERS
CKMB, poc: 1.2 ng/mL (ref 1.0–8.0)
Troponin i, poc: <0.05 ng/mL (ref 0.00–0.09)
Troponin i, poc: <0.05 ng/mL (ref 0.00–0.09)

## 2010-08-08 LAB — BASIC METABOLIC PANEL
BUN: 13 mg/dL (ref 6–23)
Calcium: 10.3 mg/dL (ref 8.4–10.5)
Creatinine, Ser: 0.96 mg/dL (ref 0.4–1.2)
GFR calc Af Amer: >60 mL/min (ref >60)
GFR calc non Af Amer: 58 mL/min — ABNORMAL LOW (ref >60)

## 2010-08-08 LAB — COMPREHENSIVE METABOLIC PANEL
CO2: 26 mEq/L (ref 19–32)
Calcium: 10.3 mg/dL (ref 8.4–10.5)
Creatinine, Ser: 0.84 mg/dL (ref 0.4–1.2)
GFR calc non Af Amer: >60 mL/min (ref >60)
Glucose, Bld: 94 mg/dL (ref 70–99)
Sodium: 139 mEq/L (ref 135–145)
Total Protein: 7.7 g/dL (ref 6.0–8.3)

## 2010-08-08 LAB — PROTIME-INR
INR: 0.9 (ref 0.00–1.49)
Prothrombin Time: 12.7 seconds (ref 11.6–15.2)

## 2010-08-08 LAB — CK TOTAL AND CKMB (NOT AT ARMC)
CK, MB: 2.6 ng/mL (ref 0.3–4.0)
Total CK: 145 U/L (ref 7–177)

## 2010-08-08 LAB — CK: Total CK: 131 U/L (ref 7–177)

## 2010-08-08 LAB — TROPONIN I: Troponin I: 0.02 ng/mL (ref 0.00–0.06)

## 2010-08-08 LAB — LIPASE, BLOOD: Lipase: 22 U/L (ref 11–59)

## 2010-08-08 LAB — TSH: TSH: 0.471 u[IU]/mL (ref 0.350–4.500)

## 2010-08-12 ENCOUNTER — Other Ambulatory Visit (INDEPENDENT_AMBULATORY_CARE_PROVIDER_SITE_OTHER): Payer: Medicare Other

## 2010-08-12 ENCOUNTER — Ambulatory Visit (INDEPENDENT_AMBULATORY_CARE_PROVIDER_SITE_OTHER): Payer: Medicare Other | Admitting: Internal Medicine

## 2010-08-12 ENCOUNTER — Encounter: Payer: Self-pay | Admitting: Internal Medicine

## 2010-08-12 VITALS — BP 152/72 | HR 68 | Temp 98.5°F | Ht 64.0 in | Wt 163.5 lb

## 2010-08-12 DIAGNOSIS — J449 Chronic obstructive pulmonary disease, unspecified: Secondary | ICD-10-CM

## 2010-08-12 DIAGNOSIS — D649 Anemia, unspecified: Secondary | ICD-10-CM

## 2010-08-12 DIAGNOSIS — Z0001 Encounter for general adult medical examination with abnormal findings: Secondary | ICD-10-CM | POA: Insufficient documentation

## 2010-08-12 DIAGNOSIS — Z8679 Personal history of other diseases of the circulatory system: Secondary | ICD-10-CM

## 2010-08-12 DIAGNOSIS — K219 Gastro-esophageal reflux disease without esophagitis: Secondary | ICD-10-CM

## 2010-08-12 DIAGNOSIS — B370 Candidal stomatitis: Secondary | ICD-10-CM | POA: Insufficient documentation

## 2010-08-12 DIAGNOSIS — J4489 Other specified chronic obstructive pulmonary disease: Secondary | ICD-10-CM

## 2010-08-12 DIAGNOSIS — Z0000 Encounter for general adult medical examination without abnormal findings: Secondary | ICD-10-CM

## 2010-08-12 LAB — IBC PANEL
Iron: 34 ug/dL — ABNORMAL LOW (ref 42–145)
Saturation Ratios: 10.7 % — ABNORMAL LOW (ref 20.0–50.0)
Transferrin: 226.6 mg/dL (ref 212.0–360.0)

## 2010-08-12 LAB — CBC WITH DIFFERENTIAL/PLATELET
Basophils Absolute: 0 10*3/uL (ref 0.0–0.1)
Eosinophils Absolute: 0.2 10*3/uL (ref 0.0–0.7)
Hemoglobin: 12 g/dL (ref 12.0–15.0)
Lymphocytes Relative: 37 % (ref 12.0–46.0)
MCHC: 33.8 g/dL (ref 30.0–36.0)
Monocytes Relative: 11.9 % (ref 3.0–12.0)
Neutro Abs: 3.1 10*3/uL (ref 1.4–7.7)
Neutrophils Relative %: 46.8 % (ref 43.0–77.0)
Platelets: 268 10*3/uL (ref 150.0–400.0)
RDW: 14.4 % (ref 11.5–14.6)

## 2010-08-12 MED ORDER — NYSTATIN 100000 UNIT/ML MT SUSP: 500000.0000 [IU] | Freq: Four times a day (QID) | OROMUCOSAL | Status: AC

## 2010-08-12 MED ORDER — PANTOPRAZOLE SODIUM 40 MG PO TBEC: 40.0000 mg | DELAYED_RELEASE_TABLET | Freq: Every day | ORAL | Status: DC

## 2010-08-12 NOTE — Progress Notes (Signed)
Subjective:    Patient ID: Wendy Torres, female    DOB: [birth year], 70 y.o.   MRN: [id number]  HPI  Here to f/u with daughter, has seen Dr Burna Sis  And rec'd for left hip replacement, and Dr dumonski/ortho did cortisone to left knee and much improved;  No over bleeding or bruising since last visit with iron def anemia,  Has not yet been able to schedule the colonscopy;  Pt denies chest pain, increased sob or doe, wheezing, orthopnea, PND, increased LE swelling, palpitations, dizziness or syncope.  Pt denies new neurological symptoms such as new headache, or facial or extremity weakness or numbness   Pt denies polydipsia, polyuria.  Pt states overall good compliance with meds, trying to follow lower cholesterol, diabetic diet, wt overall stable but little exercise however.    Has had some breakthrough reflux and feels her current PPI not working well.  Has hx of esoph stricture with recent dilation doing well wtihout recurren dysphagia.  Does also have mild coating to tongue which causes some discomfort but no tongue ulcers, swelling.  Overall good compliance with treatment, and good medicine tolerability. Denies worsening depressive symptoms, suicidal ideation, or panic. Past Medical History  Diagnosis Date  . HYPERLIPIDEMIA 04/18/2007  . Morbid obesity 12/09/2006  . ANEMIA 01/14/2009  . Chronic pain syndrome 02/16/2009  . HYPERTENSION 12/09/2006  . ACUTE URIS OF UNSPECIFIED SITE 10/22/2007  . ALLERGIC RHINITIS 12/09/2006  . ASTHMA 12/09/2006  . COPD 12/09/2006  . Glossitis 01/14/2009  . GERD 12/09/2006  . HIATAL HERNIA 03/01/2010  . OVERACTIVE BLADDER 04/14/2008  . DEGENERATIVE JOINT DISEASE 12/09/2006  . LOW BACK PAIN 12/09/2006  . OSTEOPOROSIS 12/09/2006  . SYNCOPE 11/05/2008  . DIZZINESS 03/30/2010  . INSOMNIA-SLEEP DISORDER-UNSPEC 11/05/2008  . HYPERSOMNIA 08/31/2008  . PALPITATIONS, RECURRENT 01/27/2010  . DYSPNEA 01/27/2010  . Other dysphagia 01/14/2009  . FREQUENCY, URINARY 03/30/2010  .  Abdominal pain, right upper quadrant 11/28/2007  . EPIGASTRIC PAIN 01/14/2009  . Abdominal  pain, other specified site 11/28/2007  . Nonspecific (abnormal) findings on radiological and other examination of body structure 11/05/2008  . TRANSIENT ISCHEMIC ATTACK, HX OF 12/09/2006  . HELICOBACTER PYLORI GASTRITIS, HX OF 12/09/2006  . ABNORMAL CHEST XRAY 11/05/2008   Past Surgical History  Procedure Date  . Tubal ligation   . Lumbar disc surgery     reports that she has quit smoking. Her smoking use included Cigarettes. She has a 12.5 pack-year smoking history. She does not have any smokeless tobacco history on file. She reports that she does not drink alcohol or use illicit drugs. family history includes Cancer in her father; Heart disease in her mother; and Hypertension in her brother. No Known Allergies Current Outpatient Prescriptions on File Prior to Visit  Medication Sig Dispense Refill  . alendronate (FOSAMAX) 70 MG tablet Take 70 mg by mouth every 7 (seven) days. Take in the morning with a full glass of water, on an empty stomach, and do not take anything else by mouth or lie down for the next 30 min.       Marland Kitchen ALPRAZolam (XANAX) 0.25 MG tablet Take 0.25 mg by mouth 2 (two) times daily as needed.        Marland Kitchen amLODipine (NORVASC) 5 MG tablet Take 5 mg by mouth daily.        Marland Kitchen aspirin EC 325 MG EC tablet Take 325 mg by mouth once daily.        . benazepril (LOTENSIN) 20 MG tablet Take  20 mg by mouth daily.        . Cholecalciferol (VITAMIN D3) 1000 UNITS capsule Take 1,000 Units by mouth daily.        . cloNIDine (CATAPRES) 0.2 MG tablet Take 0.2 mg by mouth 2 (two) times daily.        . ferrous sulfate 325 (65 FE) MG tablet Take 325 mg by mouth daily.        . fexofenadine (ALLEGRA) 180 MG tablet Take 180 mg by mouth daily as needed.        . furosemide (LASIX) 20 MG tablet Take 20 mg by mouth every morning.        . gabapentin (NEURONTIN) 100 MG capsule Take 100 mg by mouth every 8 (eight) hours.         . lovastatin (MEVACOR) 40 MG tablet Take 40 mg by mouth daily.        . meloxicam (MOBIC) 15 MG tablet Take 15 mg by mouth daily.        Marland Kitchen oxyCODONE-acetaminophen (PERCOCET) 5-325 MG per tablet Take 1 tablet by mouth every 6 (six) hours as needed. For breakthrough pain       . zolpidem (AMBIEN) 10 MG tablet Take 10 mg by mouth at bedtime as needed.        . darifenacin (ENABLEX) 7.5 MG 24 hr tablet Take 7.5 mg by mouth daily.        . fluticasone (FLONASE) 50 MCG/ACT nasal spray 2 sprays by Nasal route daily.         Review of Systems Review of Systems  Constitutional: Negative for diaphoresis and unexpected weight change.  HENT: Negative for drooling and tinnitus.   Eyes: Negative for photophobia and visual disturbance.  Respiratory: Negative for choking and stridor.   Gastrointestinal: Negative for vomiting and blood in stool.  Genitourinary: Negative for hematuria and decreased urine volume.  Musculoskeletal: Negative for gait problem.  Skin: Negative for color change and wound.  Neurological: Negative for tremors and numbness.  Psychiatric/Behavioral: Negative for decreased concentration. The patient is not hyperactive.       Objective:   Physical Exam BP 152/72  Pulse 68  Temp(Src) 98.5 F (36.9 C) (Oral)  Ht 5\' 4"  (1.626 m)  Wt 163 lb 8 oz (74.163 kg)  BMI 28.06 kg/m2  SpO2 97% Physical Exam  VS noted Constitutional: Pt appears well-developed and well-nourished.  obese  HENT: Head: Normocephalic.  Tongue with whitsh coating, no ulcers Right Ear: External ear normal.  Left Ear: External ear normal.  Eyes: Conjunctivae and EOM are normal. Pupils are equal, round, and reactive to light.  Neck: Normal range of motion. Neck supple.  Cardiovascular: Normal rate and regular rhythm.   Pulmonary/Chest: Effort normal and breath sounds normal.  Abd:  Soft, NT, non-distended, + BS Neurological: Pt is alert. No cranial nerve deficit.  Skin: Skin is warm. No erythema.    Psychiatric: Pt behavior is normal. Thought content normal.         Assessment & Plan:

## 2010-08-12 NOTE — Patient Instructions (Addendum)
Please call Dr Patterson/GI to arrange to have the colonscopy scheduled Please go to LAB in the Basement for the blood and/or urine tests to be done today Please call the number on the Blue Card (the [employer] [facility]) for results of testing in 2-3 days The current medical regimen is effective;  continue present plan and medications, except stop the omeprazole 40 g Start the generic protonix 40 mg per day, and the nystatin is for the tongue Please return in 6 mo with Lab testing done 3-5 days before

## 2010-08-14 ENCOUNTER — Encounter: Payer: Self-pay | Admitting: Internal Medicine

## 2010-08-14 NOTE — Assessment & Plan Note (Signed)
For f/u today and iron level, pt tates will call to arrange the colonscopy

## 2010-08-14 NOTE — Assessment & Plan Note (Signed)
stable overall by hx and exam, most recent lab reviewed with pt, and pt to continue medical treatment as before 

## 2010-08-14 NOTE — Assessment & Plan Note (Signed)
Try change PPI to protnix generic,  Ok for prn zantac as well for breakthrough

## 2010-08-14 NOTE — Assessment & Plan Note (Signed)
Mild, for nystatin tx oral swish and spit,  to f/u any worsening symptoms or concerns

## 2010-08-17 ENCOUNTER — Telehealth: Payer: Self-pay

## 2010-08-17 NOTE — Telephone Encounter (Signed)
Pt's daughter called stating pt's blood pressure has been elevated x 3 days, 170s/100s. Daughter is requesting medicine adjust from MD, please advise.

## 2010-08-17 NOTE — Telephone Encounter (Signed)
Ok to increase the clonidine to 0.3 mg bid - To robin  To handle

## 2010-08-18 ENCOUNTER — Other Ambulatory Visit: Payer: Self-pay

## 2010-08-18 MED ORDER — CLONIDINE HCL 0.3 MG PO TABS: 0.3000 mg | ORAL_TABLET | Freq: Two times a day (BID) | ORAL | Status: DC

## 2010-08-18 NOTE — Telephone Encounter (Signed)
Called patient informed of prescription increase and sent to pharmacy

## 2010-09-07 ENCOUNTER — Ambulatory Visit (AMBULATORY_SURGERY_CENTER): Payer: Medicare Other | Admitting: *Deleted

## 2010-09-07 ENCOUNTER — Encounter: Payer: Self-pay | Admitting: Gastroenterology

## 2010-09-07 VITALS — Ht 64.0 in | Wt 162.2 lb

## 2010-09-07 DIAGNOSIS — Z1211 Encounter for screening for malignant neoplasm of colon: Secondary | ICD-10-CM

## 2010-09-07 MED ORDER — PEG-KCL-NACL-NASULF-NA ASC-C 100 G PO SOLR: 1.0000 | Freq: Once | ORAL | Status: AC

## 2010-09-13 NOTE — Discharge Summary (Signed)
NAMELOISE, ESGUERRA NO.:  [id number]   MEDICAL RECORD NO.:  [id number]          PATIENT TYPE:  INP   LOCATION:  1431                         FACILITY:  [facility]   PHYSICIAN:  Georgina Quint. Plotnikov, MDDATE OF BIRTH:  Feb 06, 1941   DATE OF ADMISSION:  10/17/2008  DATE OF DISCHARGE:  10/18/2008                               DISCHARGE SUMMARY   DISCHARGE MEDICATIONS:  1. Vitamin D3 1000 units daily.  2. Resume other home medicines.  3. Lasix 20 mg once a day.   SPECIAL TESTS:  CT scan of the abdomen.   SPECIAL INSTRUCTIONS:  Follow up with Dr. Jonny Ruiz in 1-2 weeks.  Call if  problems.   DISCHARGE DIAGNOSES:  1. Abdominal pain and left leg pain related to problem number 2.  2. Chronic low back pain with likely left-sided radiculopathy.  3. Near-syncope multifactorial myocardial infarction ruled out.  4. Atypical chest symptoms (palpitations and discomfort) resolved.      Myocardial infarction ruled out.  Likely related to vasovagal      reaction.   HISTORY:  The patient 70 year old female with chronic pain who presented  to [facility] with complaint of sharp pain with palpitations  in the chest left side of the abdomen back and left leg.  It happened  while she was doing dishes.  The symptoms have resolved in the emergency  room.  After that she has remained absolutely symptom-free.   HOSPITAL COURSE:  She had been feeling well and back to normal self.  Had a good night.  On the morning of discharge she has no active  complaints.   PHYSICAL EXAM:  VITAL SIGNS:  Blood pressure is 159/81, heart rate 77,  respirations 20, temperature 98.5, sats 96% on 2 liters.  HEENT with moist mucosa.  NECK:  Supple.  No thyromegaly.  LUNGS:  Clear.  No wheezes.  HEART:  S1, S2 no gallop.  ABDOMEN:  Soft, obese, nontender.  LOWER EXTREMITIES:  No edema.  LS spine tender with range of motion.  Calves nontender.  No pulsating masses.  She is alert, oriented,  cooperative.   LABS:  Chest x-ray with mild congestion and cardiomegaly.  CT of the  chest no pulmonary embolism no dissection present emphysema.  Abdominal  x-ray CT scan with no abdominal aortic dissection, aortic ectasia  present, aortic atherosclerosis.  Hemoglobin 12.2, white count 5.6,  platelets 296,000.  Sodium 139, potassium 3.5, BUN 8, creatinine 0.8.  LFTs within normal limits.  CK-MB x2 negative.  Myoglobin negative.  TSH  0.47.   If needed, she can have a cardiologic workup as an outpatient.  We are  going to walk her in hallway and discharge if tolerates well and remains  asymptomatic.      Georgina Quint. Plotnikov, MD  Electronically Signed     AVP/MEDQ  D:  10/18/2008  T:  10/18/2008  Job:  [id number]   cc:   Corwin Levins, MD  [address]  [place]  [place] [address]

## 2010-09-13 NOTE — H&P (Signed)
NAME:  Wendy Torres, Wendy Torres NO.:  [id number]   MEDICAL RECORD NO.:  [id number]          PATIENT TYPE:  EMS   LOCATION:  ED                           FACILITY:  [facility]   PHYSICIAN:  Joylene John, MD       DATE OF BIRTH:  11/14/1940   DATE OF ADMISSION:  10/17/2008  DATE OF DISCHARGE:                              HISTORY & PHYSICAL   REASON FOR ADMISSION:  Chest pain, abdominal pain, discomfort and  palpitations which started this morning.   HISTORY OF PRESENT ILLNESS:  This is a 70 year old female with history  of hypertension, hyperlipidemia, osteoarthritis, TIA, chronic back pain  coming in with pain described above which started while she was doing  the dishes with radiation to the back.  The patient describes the pain  as sharp in nature associated with palpitations. No aggravating or  relieving factors. The pain did relieve on its own earlier and now it  has come back.  It was associated with dizziness and lightheadedness  with some nausea.  She has had similar episodes in the past but never so  severe. She has noticed that her exercise tolerance has decreased. She  sleeps on three pillows which is unchanged.  No recent travels or sick  contacts.   PAST MEDICAL HISTORY:  1. Hypertension.  2. Hyperlipidemia.  3. Osteoarthritis.  4. Transient ischemic attack.  5. Chronic back pain.   FAMILY HISTORY:  No family history of coronary artery disease.   SOCIAL HISTORY:  She is retired, ex-smoker, no alcohol or drugs.   REVIEW OF SYSTEMS:  Please refer to the HPI, otherwise a 14 point review  of systems is negative.   PHYSICAL EXAMINATION:  VITAL SIGNS:  Temperature of 98.7 blood pressure  165/77 to 174/65. The pressure was checked in both extremities, manually  right was 162/70 and left was 172/84. Pulse is 72, respiratory rate 18,  O2 saturation is 96-93% on oxygen.  GENERAL:  The patient is awake and alert in light distress.  CARDIOVASCULAR:  regular rate and  rhythm on cardiovascular exam.  CHEST:  Lungs are clear to auscultation.  HEENT:  No icterus or pallor noted. NO JVD appreciated.  ABDOMEN:  On exam there is tenderness mid epigastric region with some  rebound.  Bowel sounds are present.  EXTREMITIES:  Lower extremities, no edema appreciated.   LABORATORY DATA:  Her labs show cardiac enzymes are negative.  Hemoglobin 11.4, hematocrit 34.9, white count 6.5, platelets 276,000.  Sodium 138, potassium 3.9, chloride 105, bicarb 24, BUN 13, creatinine  0.96, glucose 109, calcium 10.3.   EKG is normal sinus rhythm.   Her chest x-ray revealed that her cardiac size is enlarged and the aorta  is tortuous with no lung pathology.  There are arthritic changes in the  spine.   ASSESSMENT AND PLAN:  Plan is to admit the patient to telemetry bed. Do  serial cardiac enzymes to rule out MI.  We will continue her home  medications including her blood pressure medications.  Will hold  Meloxicam. Will get a CT of the chest  and abdomen to rule out dissection  since I am worried about that given the fact that the patient is  complaining of sharp shooting pain radiating to the back.  We will also  get amylase and lipase added to the next set of cardiac enzymes. Will  give her a trial of sublingual nitro to see if that helps her pain. She  has not received any. Once the dissection is ruled out the patient can  be started on subcutaneous Lovenox for DVT. If the patient does not  become chest pain free then she will likely need a stress test in house.  Otherwise, she can probably be followed up with outpatient cardiology  follow-up      Joylene John, MD  Electronically Signed     RP/MEDQ  D:  10/17/2008  T:  10/17/2008  Job:  [id number]

## 2010-09-16 NOTE — Discharge Summary (Signed)
[name], [name]                ACCOUNT NO.:  [id number]   MEDICAL RECORD NO.:  [id number]          PATIENT TYPE:  INP   LOCATION:  3015                         FACILITY:  [facility]   PHYSICIAN:  Pramod P. Pearlean Brownie, MD    DATE OF BIRTH:  Aug 20, 1940   DATE OF ADMISSION:  06/11/2005  DATE OF DISCHARGE:  06/12/2005                                 DISCHARGE SUMMARY   ADMISSION DIAGNOSIS:  Transient ischemic attack.   DISCHARGE DIAGNOSES:  1.  Posterior circulation transient ischemic attack.  2.  Hypertension.  3.  Hyperlipidemia.  4.  Obesity.   HOSPITAL COURSE:  Wendy Torres is a 70 year old obese African-American lady who  developed sudden onset of dizziness of balance, right leg incoordination and  right body numbness at 4:30 p.m. on the day of admission.  Symptoms resolved  after she came to the emergency room within an hour to two.  When seen by  Dr. Nash Torres she had only a stroke scale of 1 due to decreased sensation and  no other deficits.  CT scan of the head obtained on admission did not reveal  any acute abnormality.  Admission labs unremarkable.  She was admitted to  the stroke unit with telemetry monitoring, which did not reveal any cardiac  arrhythmias. Subsequently MRI scan of the brain was obtained, which did not  show any acute infarct. There was evidence of her extensive white matter  microangiopathic changes.  MRA of the brain did not reveal significant high-  grade stenosis.  Carotid ultrasound was unremarkable.  A 2-D echocardiogram  was done, but the results are pending at the time of discharge.  Urine drug  screen was negative.  Total cholesterol was normal.  LDL was borderline at  101.  Homocystine was pending at the time of discharge.  The patient was  started on aspirin for stroke prevention and advised to maintain strict  control of her blood pressure and lose weight.  She was discharged home in  unstable condition.  She was able to walk safely without any imbalance  problems.  She was advised to follow up with her primary physician, Dr.  Oliver Barre in two weeks and to maintain a strict log of her blood pressure  recordings and advised to keep systolic blood pressure below 161.  She was  also advised to follow up with Wendy Torres _______, nurse practitioner, in my  office in 4 weeks.   DISCHARGE MEDICATIONS:  1.  Aspirin 325 mg a day.  2.  Nexium 40 mg a day.  3.  Nexium 40 mg day.  4.  Avapro 40 mg a day.  5.  Fosamax 70 mg once a week.  6.  Diovan 160/12.5 once a day.           ______________________________  Wendy Torres. Pearlean Brownie, MD     PPS/MEDQ  D:  06/12/2005  T:  06/12/2005  Job:  [id number]   cc:   Corwin Levins, M.D. [facility]  [address]  [place]  [place] [address]

## 2010-09-16 NOTE — H&P (Signed)
NAME:  RONNISHA, FELBER NO.:  [id number]   MEDICAL RECORD NO.:  [id number]          PATIENT TYPE:  EMS   LOCATION:  ED                           FACILITY:  [facility]   PHYSICIAN:  Bevelyn Buckles. Champey, M.D.DATE OF BIRTH:  [birth year]   DATE OF ADMISSION:  06/10/2005  DATE OF DISCHARGE:                                HISTORY & PHYSICAL   REASON FOR CONSULTATION:  Code stroke.   HISTORY OF PRESENT ILLNESS:  Ms. Wendy Torres is a 70 year old African-American  female with multiple medical problems who presents with acute onset around  4:30 p.m. today of light-headedness and right lower extremity weakness,  causing her to be unsteady on her feet. Family noticed that she had  difficulty in dragging her right lower extremity and patient states that her  lower extremity gave out and felt weak and heavy. The patient was about to  fall and then family members caught her. The patient feels like her strength  has improved, however, does feel slightly numb and different on the right  side when compared to the left. The patient denies any other symptoms such  as tingling, vision changes, speech changes, vertigo, swallowing problems,  chewing problems, or loss of consciousness.   PAST MEDICAL HISTORY:  Positive for hypertension, reflux, back problems, and  high cholesterol.   CURRENT MEDICATIONS:  Include Diovan, Lovastatin, Nexium, and Fosamax.   ALLERGIES:  NO KNOWN DRUG ALLERGIES.   FAMILY HISTORY:  Positive for heart disease.   SOCIAL HISTORY:  She lives with her husband. Quit smoking 10 years ago.  Denies any alcohol use.   REVIEW OF SYSTEMS:  Positive as per HPI and also for occasional shortness of  breath and back pain.   REVIEW OF SYSTEMS:  Negative for greater than 7 other organs systems and as  per HPI.   PHYSICAL EXAMINATION:  VITAL SIGNS:  Temperature 97.3, pulse 78, respiratory  rate 20, blood pressure 191/99. O2 saturation is 96%.  HEENT:  Normocephalic and atraumatic.  Extraocular muscles intact. Pupils are  equal, round, and reactive to light.  NECK:  Supple. No carotid bruits.  HEART:  Regular.  LUNGS:  Clear.  ABDOMEN:  Soft, nontender.  EXTREMITIES:  No edema. Good pulses.  NEUROLOGIC:  Alert and oriented times three. Memory within normal limits.  Cranial nerves 2-12 are grossly intact. On motor examination she has 4+ to 5  out of 5 strength and normal tone in all 4 extremities. No drift is noted.  Sensory examination:  The patient has decreased sensation to light touch and  pin prick in the left upper and lower extremity and when compared to the  right. Reflexes are trace throughout and symmetric. Toes are neutral  bilaterally. Cerebellar function is within normal limits to finger to nose  and heel to shin. Gait is slightly wide based and steady and had a NIHSS of  1.   LABORATORY DATA:  EKG showed normal sinus rhythm at 71 beats per minute.   CT of the head showed no acute bleed or infarct.   WBC 6.0. Hemoglobin and hematocrit 11.9 and  36.1. Platelets 289.000. Urine  drug screen negative. PT 12.5, INR 0.9, PTT 27. Sodium 138, potassium 3.5,  chloride 107, CO2 26, BUN 11, creatinine 1.0, glucose 91. LFTs are within  normal limits. CK is 245.   IMPRESSION/PLAN:  This is a 69 year old African-American female with acute  dizziness and right lower extremity weakness which resolved and right upper  and lower extremity numbness. The patient does have numerous risk factors  and with the acute abrupt onset of symptoms, concerned for possible left  cerebrovascular accident versus a transient ischemic attack, possibly  subcortical with the patient's residual right upper and lower extremity  numbness. The patient is not a candidate for IV TPA, as the patient is  greater than 3 hours out from onset of symptoms. We will admit the patient  to stroke M.D. service and start on aspirin daily. Will check a MRI and MRA  of the brain, carotid Dopplers, and 2-D  echo. Will get homocystine levels  and lipid panel. Will get PT, OT, and speech consults. Will continue the  patient's home medications and put the patient on deep vein thrombosis  prophylaxis.      Bevelyn Buckles. Nash Shearer, M.D.  Electronically Signed     DRC/MEDQ  D:  06/10/2005  T:  06/10/2005  Job:  [id number]   cc:   Bethann Berkshire, MD  [address] [place], [place] [address]

## 2010-09-21 ENCOUNTER — Encounter: Payer: Self-pay | Admitting: Gastroenterology

## 2010-09-21 ENCOUNTER — Ambulatory Visit (AMBULATORY_SURGERY_CENTER): Payer: Medicare Other | Admitting: Gastroenterology

## 2010-09-21 VITALS — BP 161/77 | HR 68 | Temp 98.2°F | Resp 19 | Ht 64.0 in | Wt 162.0 lb

## 2010-09-21 DIAGNOSIS — K6389 Other specified diseases of intestine: Secondary | ICD-10-CM

## 2010-09-21 DIAGNOSIS — Z1211 Encounter for screening for malignant neoplasm of colon: Secondary | ICD-10-CM

## 2010-09-21 DIAGNOSIS — D649 Anemia, unspecified: Secondary | ICD-10-CM

## 2010-09-21 MED ORDER — SODIUM CHLORIDE 0.9 % IV SOLN: 500.0000 mL | INTRAVENOUS | Status: DC

## 2010-09-21 NOTE — Progress Notes (Signed)
PT HAS SEVERE LEFT HIP PAIN-DOESN'T LAY ON THIS SIDE EVER SHE STATES BUT WILL TRY

## 2010-09-21 NOTE — Progress Notes (Signed)
Pt moved herself to her left side.  A towel rolled up was placed in between her knees.  Pt. States, :that feels better". MAW  Pt tolerated the colonoscopy well.  Post procedure, pt rolled her self to her right side.  I asked if her left hip was okay, she said"yes it is".  MAW

## 2010-09-21 NOTE — Patient Instructions (Signed)
Findings:  Normal Colonoscopy  Recommendations:  PRN followup as needed.  Discharge instructions given and explained to pt and care giver.

## 2010-09-22 ENCOUNTER — Telehealth: Payer: Self-pay

## 2010-09-22 NOTE — Telephone Encounter (Signed)

## 2010-10-03 ENCOUNTER — Other Ambulatory Visit: Payer: Self-pay | Admitting: Internal Medicine

## 2010-10-03 NOTE — Telephone Encounter (Signed)
Enablex 7.5mg  [last filled 10/30/2010 #30x3] Rx Done.

## 2010-10-17 ENCOUNTER — Other Ambulatory Visit: Payer: Self-pay

## 2010-10-17 MED ORDER — ALPRAZOLAM 0.25 MG PO TABS: 0.2500 mg | ORAL_TABLET | Freq: Two times a day (BID) | ORAL | Status: DC | PRN

## 2010-10-17 NOTE — Telephone Encounter (Signed)
Faxed hardcopy to pharmacy. 

## 2010-11-14 ENCOUNTER — Telehealth: Payer: Self-pay | Admitting: *Deleted

## 2010-11-14 NOTE — Telephone Encounter (Signed)
Caller states that bladder medication given to Pt. was not working so patient stopped taking and would like to know if she could get a new Rx that is also more cost wise.?

## 2010-11-14 NOTE — Telephone Encounter (Signed)
There is only one generic medication called oxybutinin that could be taken instead of the enablex;  It can cause sleepiness and constipation, but we could try it if she wants

## 2010-11-14 NOTE — Telephone Encounter (Signed)
Patient states that she does not want to try the Oxybutinin. Will let us know if she changes her mind.

## 2010-12-02 ENCOUNTER — Other Ambulatory Visit: Payer: Self-pay

## 2010-12-02 MED ORDER — ZOLPIDEM TARTRATE 10 MG PO TABS: 10.0000 mg | ORAL_TABLET | Freq: Every evening | ORAL | Status: DC | PRN

## 2010-12-02 NOTE — Telephone Encounter (Signed)
Faxed hardcopy of prescription to [facility] at [phone]

## 2010-12-15 NOTE — Progress Notes (Signed)
Addended by: Virgel Paling on: 12/15/2010 11:50 AM   Modules accepted: Orders

## 2010-12-30 ENCOUNTER — Other Ambulatory Visit: Payer: Self-pay | Admitting: Gastroenterology

## 2011-01-13 ENCOUNTER — Telehealth: Payer: Self-pay

## 2011-01-13 MED ORDER — OXYBUTYNIN CHLORIDE ER 5 MG PO TB24: 5.0000 mg | ORAL_TABLET | Freq: Every day | ORAL | Status: DC

## 2011-01-13 NOTE — Telephone Encounter (Signed)
Pt's daughter called requesting cheaper/generic alternative to Enablex, please advise.

## 2011-01-13 NOTE — Telephone Encounter (Signed)
Ok to change the enablex to oxybutinin xl 5 qd

## 2011-01-13 NOTE — Telephone Encounter (Signed)
Called patient informed of medication change 

## 2011-02-08 ENCOUNTER — Telehealth: Payer: Self-pay | Admitting: *Deleted

## 2011-02-08 ENCOUNTER — Other Ambulatory Visit: Payer: Self-pay | Admitting: Internal Medicine

## 2011-02-08 DIAGNOSIS — K219 Gastro-esophageal reflux disease without esophagitis: Secondary | ICD-10-CM

## 2011-02-08 MED ORDER — PANTOPRAZOLE SODIUM 40 MG PO TBEC: DELAYED_RELEASE_TABLET | ORAL | Status: DC

## 2011-02-08 NOTE — Telephone Encounter (Signed)
Called informed the patient of prescription sent to pharmacy and instructions from MD as well.

## 2011-02-08 NOTE — Telephone Encounter (Signed)
Ok to increase the protonix to bid, but needs OV if not improved, or has dysphagia, n/v, wt loss, persistent pain, fever

## 2011-02-08 NOTE — Telephone Encounter (Signed)
Patient requesting RX from MD for her "bad acid reflux"

## 2011-02-13 ENCOUNTER — Ambulatory Visit: Payer: Medicare Other | Admitting: Internal Medicine

## 2011-02-14 ENCOUNTER — Other Ambulatory Visit: Payer: Self-pay | Admitting: Internal Medicine

## 2011-02-14 NOTE — Telephone Encounter (Signed)
Done per emr 

## 2011-02-16 ENCOUNTER — Other Ambulatory Visit: Payer: Self-pay | Admitting: Internal Medicine

## 2011-02-20 ENCOUNTER — Other Ambulatory Visit: Payer: Self-pay | Admitting: Internal Medicine

## 2011-03-16 ENCOUNTER — Other Ambulatory Visit: Payer: Self-pay | Admitting: Internal Medicine

## 2011-03-21 ENCOUNTER — Other Ambulatory Visit: Payer: Self-pay | Admitting: Internal Medicine

## 2011-04-07 ENCOUNTER — Encounter: Payer: Self-pay | Admitting: Internal Medicine

## 2011-04-07 ENCOUNTER — Ambulatory Visit (INDEPENDENT_AMBULATORY_CARE_PROVIDER_SITE_OTHER): Payer: Medicare Other | Admitting: Internal Medicine

## 2011-04-07 ENCOUNTER — Other Ambulatory Visit (INDEPENDENT_AMBULATORY_CARE_PROVIDER_SITE_OTHER): Payer: Medicare Other

## 2011-04-07 VITALS — BP 132/70 | HR 56 | Temp 98.2°F | Ht 64.0 in | Wt 163.4 lb

## 2011-04-07 DIAGNOSIS — Z23 Encounter for immunization: Secondary | ICD-10-CM

## 2011-04-07 DIAGNOSIS — I10 Essential (primary) hypertension: Secondary | ICD-10-CM

## 2011-04-07 DIAGNOSIS — D649 Anemia, unspecified: Secondary | ICD-10-CM

## 2011-04-07 DIAGNOSIS — Z0000 Encounter for general adult medical examination without abnormal findings: Secondary | ICD-10-CM

## 2011-04-07 DIAGNOSIS — Z79899 Other long term (current) drug therapy: Secondary | ICD-10-CM

## 2011-04-07 DIAGNOSIS — B370 Candidal stomatitis: Secondary | ICD-10-CM

## 2011-04-07 DIAGNOSIS — Z01818 Encounter for other preprocedural examination: Secondary | ICD-10-CM

## 2011-04-07 DIAGNOSIS — K219 Gastro-esophageal reflux disease without esophagitis: Secondary | ICD-10-CM

## 2011-04-07 LAB — CBC WITH DIFFERENTIAL/PLATELET
Basophils Relative: 0.6 % (ref 0.0–3.0)
Eosinophils Absolute: 0.2 10*3/uL (ref 0.0–0.7)
Eosinophils Relative: 3.8 % (ref 0.0–5.0)
HCT: 33.7 % — ABNORMAL LOW (ref 36.0–46.0)
Hemoglobin: 11.5 g/dL — ABNORMAL LOW (ref 12.0–15.0)
Lymphs Abs: 1.9 10*3/uL (ref 0.7–4.0)
MCHC: 34.1 g/dL (ref 30.0–36.0)
MCV: 85.5 fl (ref 78.0–100.0)
Monocytes Absolute: 0.6 10*3/uL (ref 0.1–1.0)
Neutro Abs: 3 10*3/uL (ref 1.4–7.7)
RBC: 3.95 Mil/uL (ref 3.87–5.11)
WBC: 5.6 10*3/uL (ref 4.5–10.5)

## 2011-04-07 LAB — URINALYSIS, ROUTINE W REFLEX MICROSCOPIC
Bilirubin Urine: NEGATIVE
Nitrite: NEGATIVE
Total Protein, Urine: NEGATIVE
Urine Glucose: NEGATIVE
pH: 6 (ref 5.0–8.0)

## 2011-04-07 LAB — LIPID PANEL
Cholesterol: 141 mg/dL (ref 0–200)
LDL Cholesterol: 73 mg/dL (ref 0–99)
Total CHOL/HDL Ratio: 2
Triglycerides: 45 mg/dL (ref 0.0–149.0)
VLDL: 9 mg/dL (ref 0.0–40.0)

## 2011-04-07 LAB — HEPATIC FUNCTION PANEL
Bilirubin, Direct: 0 mg/dL (ref 0.0–0.3)
Total Bilirubin: 0.6 mg/dL (ref 0.3–1.2)

## 2011-04-07 LAB — BASIC METABOLIC PANEL
BUN: 13 mg/dL (ref 6–23)
Chloride: 106 mEq/L (ref 96–112)
Creatinine, Ser: 0.8 mg/dL (ref 0.4–1.2)
GFR: 86.86 mL/min (ref >60.00)

## 2011-04-07 MED ORDER — CLONIDINE HCL 0.1 MG PO TABS: 0.1000 mg | ORAL_TABLET | Freq: Two times a day (BID) | ORAL | Status: DC

## 2011-04-07 MED ORDER — NYSTATIN 100000 UNIT/ML MT SUSP: 500000.0000 [IU] | Freq: Four times a day (QID) | OROMUCOSAL | Status: AC

## 2011-04-07 MED ORDER — PANTOPRAZOLE SODIUM 40 MG PO TBEC: DELAYED_RELEASE_TABLET | ORAL | Status: DC

## 2011-04-07 MED ORDER — BENAZEPRIL HCL 40 MG PO TABS: 40.0000 mg | ORAL_TABLET | Freq: Every day | ORAL | Status: DC

## 2011-04-07 NOTE — Progress Notes (Signed)
Subjective:    Patient ID: Wendy Torres, female    DOB: [birth year], 70 y.o.   MRN: [id number]  HPI  Here for wellness and f/u;  Overall doing ok;  Pt denies CP, worsening SOB, DOE, wheezing, orthopnea, PND, worsening LE edema, palpitations, dizziness or syncope.  Pt denies neurological change such as new Headache, facial or extremity weakness.  Pt denies polydipsia, polyuria, or low sugar symptoms. Pt states overall good compliance with treatment and medications, good tolerability, and trying to follow lower cholesterol diet.  Pt denies worsening depressive symptoms, suicidal ideation or panic. No fever, wt loss, night sweats, loss of appetite, or other constitutional symptoms.  Pt states good ability with ADL's, low fall risk, home safety reviewed and adequate, no significant changes in hearing or vision, and occasionally active with exercise. Also  For lumbar decompression and fusion L3-4-5 per Dr Dumonsky/[place] [facility], date not yet set.  Pt herself not completely committed to have the surgury but here to cover the bases as she makes her final decision.  Does also have mouth discomfort for several weeks, dryness, ? Worse since incresed the clonidine recent.   Also with uncontrolled reflux without dysphgaia, n/v, abd pain, bowel change orr blood.  Also still on the oral iron but no constipation, but wants to know if she can come off.  Due for labs and immunizations today Past Medical History  Diagnosis Date  . HYPERLIPIDEMIA 04/18/2007  . Morbid obesity 12/09/2006  . ANEMIA 01/14/2009  . Chronic pain syndrome 02/16/2009  . HYPERTENSION 12/09/2006  . ACUTE URIS OF UNSPECIFIED SITE 10/22/2007  . ALLERGIC RHINITIS 12/09/2006  . ASTHMA 12/09/2006  . COPD 12/09/2006  . Glossitis 01/14/2009  . GERD 12/09/2006  . HIATAL HERNIA 03/01/2010  . OVERACTIVE BLADDER 04/14/2008  . DEGENERATIVE JOINT DISEASE 12/09/2006  . LOW BACK PAIN 12/09/2006  . OSTEOPOROSIS 12/09/2006  . SYNCOPE 11/05/2008  . DIZZINESS 03/30/2010  .  INSOMNIA-SLEEP DISORDER-UNSPEC 11/05/2008  . HYPERSOMNIA 08/31/2008  . PALPITATIONS, RECURRENT 01/27/2010  . DYSPNEA 01/27/2010  . Other dysphagia 01/14/2009  . FREQUENCY, URINARY 03/30/2010  . Abdominal pain, right upper quadrant 11/28/2007  . EPIGASTRIC PAIN 01/14/2009  . Abdominal  pain, other specified site 11/28/2007  . Nonspecific (abnormal) findings on radiological and other examination of body structure 11/05/2008  . TRANSIENT ISCHEMIC ATTACK, HX OF 12/09/2006  . HELICOBACTER PYLORI GASTRITIS, HX OF 12/09/2006  . ABNORMAL CHEST XRAY 11/05/2008   Past Surgical History  Procedure Date  . Tubal ligation   . Lumbar disc surgery   . Colonoscopy     reports that she has quit smoking. Her smoking use included Cigarettes. She has a 12.5 pack-year smoking history. She does not have any smokeless tobacco history on file. She reports that she does not drink alcohol or use illicit drugs. family history includes Cancer in her father; Heart disease in her mother; and Hypertension in her brother. No Known Allergies Current Outpatient Prescriptions on File Prior to Visit  Medication Sig Dispense Refill  . alendronate (FOSAMAX) 70 MG tablet TAKE 1 TABLET BY MOUTH WEEKLY  12 tablet  1  . ALPRAZolam (XANAX) 0.25 MG tablet Take 1 tablet (0.25 mg total) by mouth 2 (two) times daily as needed.  60 tablet  2  . amLODipine (NORVASC) 5 MG tablet TAKE 1 TABLET BY MOUTH EVERY DAY  30 tablet  6  . aspirin EC 325 MG EC tablet Take 325 mg by mouth once daily.        Marland Kitchen  Cholecalciferol (VITAMIN D3) 1000 UNITS capsule Take 1,000 Units by mouth daily.        . ferrous sulfate 325 (65 FE) MG tablet TAKE 1 TABLET BY MOUTH EVERY DAY  30 tablet  6  . furosemide (LASIX) 20 MG tablet Take 20 mg by mouth every morning.        . gabapentin (NEURONTIN) 100 MG capsule Take 100 mg by mouth every 8 (eight) hours.        . lovastatin (MEVACOR) 40 MG tablet TAKE 1 TABLET BY MOUTH EVERY DAY  90 tablet  0  . meloxicam (MOBIC) 15 MG tablet  TAKE 1 TABLET EVERY DAY  30 tablet  2  . oxybutynin (DITROPAN-XL) 5 MG 24 hr tablet Take 1 tablet (5 mg total) by mouth daily.  90 tablet  3  . oxyCODONE-acetaminophen (PERCOCET) 5-325 MG per tablet Take 1 tablet by mouth every 6 (six) hours as needed. For breakthrough pain       . zolpidem (AMBIEN) 10 MG tablet Take 1 tablet (10 mg total) by mouth at bedtime as needed.  30 tablet  5   Review of Systems Review of Systems  Constitutional: Negative for diaphoresis, activity change, appetite change and unexpected weight change.  HENT: Negative for hearing loss, ear pain, facial swelling, mouth sores and neck stiffness.   Eyes: Negative for pain, redness and visual disturbance.  Respiratory: Negative for shortness of breath and wheezing.   Cardiovascular: Negative for chest pain and palpitations.  Gastrointestinal: Negative for diarrhea, blood in stool, abdominal distention and rectal pain.  Genitourinary: Negative for hematuria, flank pain and decreased urine volume.  Musculoskeletal: Negative for myalgias and joint swelling.  Skin: Negative for color change and wound.  Neurological: Negative for syncope and numbness.  Hematological: Negative for adenopathy.  Psychiatric/Behavioral: Negative for hallucinations, self-injury, decreased concentration and agitation.      Objective:   Physical Exam BP 132/70  Pulse 56  Temp(Src) 98.2 F (36.8 C) (Oral)  Ht 5\' 4"  (1.626 m)  Wt 163 lb 6 oz (74.106 kg)  BMI 28.04 kg/m2  SpO2 97% Physical Exam  VS noted, somewhat frail, has lost wt from her peak, defers to daughter for comprehension and help with decisions, in wheelchair but able to stand and ambulate with assist though painful Constitutional: Pt is oriented to person, place, and time. Appears well-developed and well-nourished.  Head: Normocephalic and atraumatic.  Right Ear: External ear normal.  Left Ear: External ear normal.  Nose: Nose normal.  Mouth/Throat: Oropharynx is clear and  moist. tongue with mild erythema, no ulcer or mass Eyes: Conjunctivae and EOM are normal. Pupils are equal, round, and reactive to light.  Neck: Normal range of motion. Neck supple. No JVD present. No tracheal deviation present.  Cardiovascular: Normal rate, regular rhythm, normal heart sounds and intact distal pulses.   Pulmonary/Chest: Effort normal and breath sounds normal.  Abdominal: Soft. Bowel sounds are normal. There is no tenderness.  Musculoskeletal: Normal range of motion. Exhibits no edema.  Lymphadenopathy:  Has no cervical adenopathy.  Neurological: Pt is alert and oriented to person, place, and time. Pt has normal reflexes. No cranial nerve deficit.  Skin: Skin is warm and dry. No rash noted.  Psychiatric:  . Behavior is normal. Cognition somewhat slow.  1-2+ nervous Spine diffuse tender lower lumbar without swelling, erythema, rash     Assessment & Plan:

## 2011-04-07 NOTE — Patient Instructions (Signed)
You had the flu shot, and tetanus shot today Your EKG was OK today Please decrease the clonidine to 0.1 mg twice per day Please increase the benazepril to 40 mg per day Please increase the Protonix (generic) to twice per day Please try the Nystatin solution for the mouth pain Please go to LAB in the Basement for the blood and/or urine tests to be done today You will be contacted regarding the referral for: stress test Please call the phone number [phone] (the PhoneTree System) for results of testing in 2-3 days;  When calling, simply dial the number, and when prompted enter the MRN number above (the Medical Record Number) and the # key, then the message should start. We will check iron level today; if ok you will be able to stop the iron pill Please return in 6 mo with Lab testing done 3-5 days before

## 2011-04-07 NOTE — Assessment & Plan Note (Signed)

## 2011-04-07 NOTE — Assessment & Plan Note (Addendum)
With ? Mouth dryness due to clonidine - to decr the clonidin to .1 mg, increase the benazepril to 40 mg,  to f/u any worsening symptoms or concerns, also BP at home adn nexty visit

## 2011-04-09 ENCOUNTER — Encounter: Payer: Self-pay | Admitting: Internal Medicine

## 2011-04-09 NOTE — Assessment & Plan Note (Signed)
ECG reviewed as per emr, o/w ideally should have preop stress testing - will order,  to f/u any worsening symptoms or concerns

## 2011-04-09 NOTE — Assessment & Plan Note (Signed)
Uncontrolled per pt - to incr the PPI to BID,  to f/u any worsening symptoms or concerns , consider GI referral

## 2011-04-09 NOTE — Assessment & Plan Note (Signed)
With mouth discomfort, exam benign, for nystatin but also decresed clonidine (see HTN discussion)

## 2011-04-11 ENCOUNTER — Other Ambulatory Visit (HOSPITAL_COMMUNITY): Payer: Medicare Other | Admitting: Radiology

## 2011-04-13 ENCOUNTER — Other Ambulatory Visit: Payer: Self-pay | Admitting: Internal Medicine

## 2011-04-18 ENCOUNTER — Telehealth: Payer: Self-pay

## 2011-04-18 MED ORDER — LANSOPRAZOLE 30 MG PO CPDR: 30.0000 mg | DELAYED_RELEASE_CAPSULE | Freq: Every day | ORAL | Status: DC

## 2011-04-18 MED ORDER — PROMETHAZINE HCL 25 MG PO TABS: 25.0000 mg | ORAL_TABLET | Freq: Four times a day (QID) | ORAL | Status: AC | PRN

## 2011-04-18 NOTE — Telephone Encounter (Signed)
Patient informed of medication change 

## 2011-04-18 NOTE — Telephone Encounter (Signed)
protonix does not normally do this, but can be changed if she want to prevacid, but I wonder about other problem such as constipation causing the nausea;  Ok for phenergan prn, and if the nausea persists though she might consider OV

## 2011-04-18 NOTE — Telephone Encounter (Signed)
Pt called stating Protonix is causing indigestion, and nausea. Pt is requesting alternative medication (generic)

## 2011-05-15 ENCOUNTER — Ambulatory Visit (HOSPITAL_COMMUNITY): Payer: Medicare Other | Attending: Internal Medicine | Admitting: Radiology

## 2011-05-15 VITALS — BP 126/92 | Ht 64.0 in | Wt 163.0 lb

## 2011-05-15 DIAGNOSIS — Z87891 Personal history of nicotine dependence: Secondary | ICD-10-CM | POA: Insufficient documentation

## 2011-05-15 DIAGNOSIS — E785 Hyperlipidemia, unspecified: Secondary | ICD-10-CM | POA: Insufficient documentation

## 2011-05-15 DIAGNOSIS — E669 Obesity, unspecified: Secondary | ICD-10-CM | POA: Insufficient documentation

## 2011-05-15 DIAGNOSIS — R5381 Other malaise: Secondary | ICD-10-CM | POA: Insufficient documentation

## 2011-05-15 DIAGNOSIS — R002 Palpitations: Secondary | ICD-10-CM | POA: Insufficient documentation

## 2011-05-15 DIAGNOSIS — Z8673 Personal history of transient ischemic attack (TIA), and cerebral infarction without residual deficits: Secondary | ICD-10-CM | POA: Insufficient documentation

## 2011-05-15 DIAGNOSIS — Z01810 Encounter for preprocedural cardiovascular examination: Secondary | ICD-10-CM | POA: Insufficient documentation

## 2011-05-15 DIAGNOSIS — I10 Essential (primary) hypertension: Secondary | ICD-10-CM

## 2011-05-15 DIAGNOSIS — R0989 Other specified symptoms and signs involving the circulatory and respiratory systems: Secondary | ICD-10-CM | POA: Insufficient documentation

## 2011-05-15 DIAGNOSIS — R5383 Other fatigue: Secondary | ICD-10-CM | POA: Insufficient documentation

## 2011-05-15 DIAGNOSIS — R0609 Other forms of dyspnea: Secondary | ICD-10-CM | POA: Insufficient documentation

## 2011-05-15 DIAGNOSIS — Z01818 Encounter for other preprocedural examination: Secondary | ICD-10-CM

## 2011-05-15 DIAGNOSIS — R0602 Shortness of breath: Secondary | ICD-10-CM

## 2011-05-15 MED ORDER — TECHNETIUM TC 99M TETROFOSMIN IV KIT
11.0000 | PACK | Freq: Once | INTRAVENOUS | Status: AC | PRN
  Administered 2011-05-15: 11 via INTRAVENOUS

## 2011-05-15 MED ORDER — TECHNETIUM TC 99M TETROFOSMIN IV KIT
33.0000 | PACK | Freq: Once | INTRAVENOUS | Status: AC | PRN
  Administered 2011-05-15: 33 via INTRAVENOUS

## 2011-05-15 MED ORDER — REGADENOSON 0.4 MG/5ML IV SOLN
0.4000 mg | Freq: Once | INTRAVENOUS | Status: AC
  Administered 2011-05-15: 0.4 mg via INTRAVENOUS

## 2011-05-15 NOTE — Progress Notes (Signed)
[facility] SITE 3 NUCLEAR MED [address] [place] [address] [phone]  Cardiology Nuclear Med Study  Wendy Torres is a 71 y.o. female [id number] [birth year]   Nuclear Med Background Indication for Stress Test:  Evaluation for Ischemia and Pending Surgical Clearance for Lumbar Decompression & Fusion by Dr. Marissa Nestle History:  '98 ION:GEXBMW; '10 Myocardial Perfusion Study:Normal, EF=75%; '11 Echo:EF=60-65%, normal Cardiac Risk Factors: History of Smoking, Hypertension, Lipids, Obesity and TIA  Symptoms: DOE, Fatigue and Palpitations   Nuclear Pre-Procedure Caffeine/Decaff Intake:  None NPO After: 5:30am   Lungs:  clear IV 0.9% NS with Angio Cath:  20g  IV Site: R Forearm  IV Started by:  Stanton Kidney, EMT-P  Chest Size (in):  40 Cup Size: B  Height: 5\' 4"  (1.626 m)  Weight:  163 lb (73.936 kg)  BMI:  Body mass index is 27.98 kg/(m^2). Tech Comments:  No medications taken today.    Nuclear Med Study 1 or 2 day study: 1 day  Stress Test Type:  Eugenie Birks  Reading MD: Dietrich Pates, MD  Order Authorizing Provider:  Oliver Barre, MD  Resting Radionuclide: Technetium 63m Tetrofosmin  Resting Radionuclide Dose: 11.0 mCi   Stress Radionuclide:  Technetium 75m Tetrofosmin  Stress Radionuclide Dose: 33.0 mCi           Stress Protocol Rest HR: 65 Stress HR: 88  Rest BP: 126/92 Stress BP: 197/77  Exercise Time (min): n/a METS: n/a   Predicted Max HR: 148 bpm % Max HR: 59.46 bpm Rate Pressure Product: 41324   Dose of Adenosine (mg):  n/a Dose of Lexiscan: 0.4 mg  Dose of Atropine (mg): n/a Dose of Dobutamine: n/a mcg/kg/min (at max HR)  Stress Test Technologist: Smiley Houseman, CMA-N  Nuclear Technologist:  Domenic Polite, CNMT     Rest Procedure:  Myocardial perfusion imaging was performed at rest 45 minutes following the intravenous administration of Technetium 59m Tetrofosmin.  Rest ECG:No Acute changes.  Stress Procedure:  The patient received IV  Lexiscan 0.4 mg over 15-seconds.  Technetium 54m Tetrofosmin injected at 30-seconds.  There were no significant changes with Lexiscan.  She did have a hypertensive response to [place]; she went from 126/92 to 197/77 and continued climbing; 218/96 after images.  Patient took her medications and BP rechecked 30-minutes later, 178/90.  Quantitative spect images were obtained after a 45 minute delay.  Stress ECG: No significant change from baseline ECG  Patient advised to follow up with BP checks at home.  QPS Raw Data Images:  Soft tissue (diaphragm, breast) surround heart. Stress Images:  Normal homogeneous uptake in all areas of the myocardium. Rest Images:  Normal homogeneous uptake in all areas of the myocardium. Subtraction (SDS):  No evidence of ischemia. Transient Ischemic Dilatation (Normal <1.22):  1.02 Lung/Heart Ratio (Normal <0.45):  0.16  Quantitative Gated Spect Images QGS EDV:  86 ml QGS ESV:  27 ml QGS cine images:  NL LV Function; NL Wall Motion QGS EF: 69%  Impression Exercise Capacity:  Lexiscan with no exercise. BP Response:  Normal blood pressure response. Clinical Symptoms:  No chest pain. ECG Impression:  No significant ST segment change suggestive of ischemia. Comparison with Prior Nuclear Study:  Overall Impression:  Normal stress nuclear study.  Dietrich Pates

## 2011-05-16 ENCOUNTER — Encounter: Payer: Self-pay | Admitting: Internal Medicine

## 2011-05-17 ENCOUNTER — Telehealth: Payer: Self-pay | Admitting: *Deleted

## 2011-05-17 MED ORDER — CLONIDINE HCL 0.2 MG PO TABS: 0.2000 mg | ORAL_TABLET | Freq: Two times a day (BID) | ORAL | Status: DC

## 2011-05-17 NOTE — Telephone Encounter (Signed)
Caller reports patient had stress test done 01.15.13 & BP got above 200 systolic and was kept one hr longer for observation; told to keep check on BP at home today. Caller reports BP of 181/100 & 175/100 w/edema in legs; requesting recommendation if new medication is needed.? Please advise.

## 2011-05-17 NOTE — Telephone Encounter (Signed)
Ok to incr the clonidine as per [employer]

## 2011-05-17 NOTE — Telephone Encounter (Signed)
Patients daughter informed of medication increase.

## 2011-06-06 ENCOUNTER — Telehealth: Payer: Self-pay

## 2011-06-06 NOTE — Telephone Encounter (Signed)
Patient informed to pickup prescription at front desk. 

## 2011-06-06 NOTE — Telephone Encounter (Signed)
Daughter Katrina called requesting an Rx for a wheelchair for pt.

## 2011-06-06 NOTE — Telephone Encounter (Signed)
Done hardcopt to robin

## 2011-06-08 ENCOUNTER — Telehealth: Payer: Self-pay

## 2011-06-08 NOTE — Telephone Encounter (Signed)
Patients daughter informed prescription for walker is ready for pickup at front desk.

## 2011-06-08 NOTE — Telephone Encounter (Signed)
Done hardcopy to robin  

## 2011-06-08 NOTE — Telephone Encounter (Signed)
Katrina the patients daughter called requested rx for a walker for her mom. Call back number when ready is [phone]

## 2011-06-09 ENCOUNTER — Other Ambulatory Visit: Payer: Self-pay

## 2011-06-09 MED ORDER — ZOLPIDEM TARTRATE 10 MG PO TABS: 10.0000 mg | ORAL_TABLET | Freq: Every evening | ORAL | Status: DC | PRN

## 2011-06-09 NOTE — Telephone Encounter (Signed)
Faxed hardcopy to pharmacy. 

## 2011-06-22 ENCOUNTER — Other Ambulatory Visit: Payer: Self-pay | Admitting: Internal Medicine

## 2011-07-13 ENCOUNTER — Telehealth: Payer: Self-pay

## 2011-07-13 MED ORDER — OXYBUTYNIN CHLORIDE 5 MG PO TABS: 5.0000 mg | ORAL_TABLET | Freq: Two times a day (BID) | ORAL | Status: DC

## 2011-07-13 MED ORDER — OMEPRAZOLE 20 MG PO CPDR: 20.0000 mg | DELAYED_RELEASE_CAPSULE | Freq: Every day | ORAL | Status: DC

## 2011-07-13 NOTE — Telephone Encounter (Signed)
Pt's daughter called requesting a cheaper alternative to her Prevacid and Ditropan. Per daughter, each Rx is $40 per month.

## 2011-07-13 NOTE — Telephone Encounter (Signed)
Ok to change to prilosec, and "regular" oxybutinin - done per emr

## 2011-07-14 NOTE — Telephone Encounter (Signed)
Called left message that prescription has been changed as requested.

## 2011-08-01 ENCOUNTER — Ambulatory Visit
Admission: RE | Admit: 2011-08-01 | Discharge: 2011-08-01 | Disposition: A | Discharge status: Home / Self Care | Payer: Medicare Other | Source: Ambulatory Visit | Attending: Anesthesiology | Admitting: Anesthesiology

## 2011-08-01 ENCOUNTER — Other Ambulatory Visit: Payer: Self-pay | Admitting: Anesthesiology

## 2011-08-01 DIAGNOSIS — R52 Pain, unspecified: Secondary | ICD-10-CM

## 2011-08-11 ENCOUNTER — Telehealth: Payer: Self-pay

## 2011-08-11 MED ORDER — OXYBUTYNIN CHLORIDE ER 10 MG PO TB24: 10.0000 mg | ORAL_TABLET | Freq: Two times a day (BID) | ORAL | Status: DC

## 2011-08-11 NOTE — Telephone Encounter (Signed)
Patients daughter informed  Of medication increase.

## 2011-08-11 NOTE — Telephone Encounter (Signed)
Ok to change rx as per emr - strenght increased

## 2011-08-11 NOTE — Telephone Encounter (Signed)
Pt's daughter called on behalf of pt stating that oxybutynin is not helping with urinary frequency. Pt is still having to go to the bathroom often and sometimes she does not make it in time. Pt is requesting alternative medication (generic) or increase in dosage, please advise.

## 2011-09-04 ENCOUNTER — Encounter: Payer: Self-pay | Admitting: Internal Medicine

## 2011-09-04 ENCOUNTER — Telehealth: Payer: Self-pay

## 2011-09-04 DIAGNOSIS — R269 Unspecified abnormalities of gait and mobility: Secondary | ICD-10-CM | POA: Insufficient documentation

## 2011-09-04 NOTE — Telephone Encounter (Signed)
Faxed rx to [facility] and called the daughter to pickup prescription at front desk.

## 2011-09-04 NOTE — Telephone Encounter (Signed)
Done hardcopy on prescription paper to robin

## 2011-09-04 NOTE — Telephone Encounter (Signed)
Pt's daughter called requesting Rx for DME Walker to [facility] for this pt. [facility] fax [phone]

## 2011-09-07 ENCOUNTER — Ambulatory Visit: Payer: Medicare Other | Admitting: Internal Medicine

## 2011-09-08 ENCOUNTER — Other Ambulatory Visit: Payer: Self-pay | Admitting: Internal Medicine

## 2011-09-21 ENCOUNTER — Ambulatory Visit: Payer: Medicare Other | Admitting: Internal Medicine

## 2011-09-26 ENCOUNTER — Ambulatory Visit: Payer: Medicare Other | Admitting: Internal Medicine

## 2011-09-29 ENCOUNTER — Encounter: Payer: Self-pay | Admitting: Internal Medicine

## 2011-09-29 ENCOUNTER — Ambulatory Visit (INDEPENDENT_AMBULATORY_CARE_PROVIDER_SITE_OTHER): Payer: Medicare Other | Admitting: Internal Medicine

## 2011-09-29 ENCOUNTER — Ambulatory Visit: Payer: Medicare Other | Admitting: Internal Medicine

## 2011-09-29 VITALS — BP 142/80 | HR 82 | Temp 98.6°F | Ht 64.0 in | Wt 165.4 lb

## 2011-09-29 DIAGNOSIS — I10 Essential (primary) hypertension: Secondary | ICD-10-CM

## 2011-09-29 DIAGNOSIS — Z0000 Encounter for general adult medical examination without abnormal findings: Secondary | ICD-10-CM

## 2011-09-29 DIAGNOSIS — M5412 Radiculopathy, cervical region: Secondary | ICD-10-CM

## 2011-09-29 DIAGNOSIS — K1379 Other lesions of oral mucosa: Secondary | ICD-10-CM

## 2011-09-29 DIAGNOSIS — K1370 Unspecified lesions of oral mucosa: Secondary | ICD-10-CM

## 2011-09-29 DIAGNOSIS — N318 Other neuromuscular dysfunction of bladder: Secondary | ICD-10-CM

## 2011-09-29 MED ORDER — DIAZEPAM 5 MG PO TABS: ORAL_TABLET | ORAL | Status: DC

## 2011-09-29 MED ORDER — OXYBUTYNIN CHLORIDE 5 MG PO TABS: 5.0000 mg | ORAL_TABLET | Freq: Three times a day (TID) | ORAL | Status: DC

## 2011-09-29 MED ORDER — DIPHENHYD-HYDROCORT-NYSTATIN MT SUSP: 5.0000 mL | Freq: Three times a day (TID) | OROMUCOSAL | Status: DC | PRN

## 2011-09-29 MED ORDER — OXYBUTYNIN CHLORIDE 5 MG PO TABS: ORAL_TABLET | ORAL | Status: DC

## 2011-09-29 NOTE — Patient Instructions (Addendum)
Ok to stop the ditropan XL Take all new medications as prescribed  - the oxybutinin generic 5 mg, and the magic mouthwash You will be contacted regarding the referral for: MRI for the neck spine Please take the valium 5 mg pill about 45 min before the MRI Please check your Blood Pressure on a regular basis; your goal is to be less than 140/90 Please keep your appointments with your specialists as you have planned - pain clinic Please return in 6 mo with Lab testing done 3-5 days before, or sooner if needed

## 2011-09-30 ENCOUNTER — Encounter: Payer: Self-pay | Admitting: Internal Medicine

## 2011-09-30 DIAGNOSIS — K1379 Other lesions of oral mucosa: Secondary | ICD-10-CM | POA: Insufficient documentation

## 2011-09-30 DIAGNOSIS — M5412 Radiculopathy, cervical region: Secondary | ICD-10-CM | POA: Insufficient documentation

## 2011-09-30 NOTE — Assessment & Plan Note (Signed)
To change to oxybutinin IR due to cost, o/w stable

## 2011-09-30 NOTE — Assessment & Plan Note (Signed)
For MRI c-spine, consider surgical referral, also for valium 5 mg for the procedure due to anxiety

## 2011-09-30 NOTE — Progress Notes (Signed)
Subjective:    Patient ID: Wendy Torres, female    DOB: [birth year], 71 y.o.   MRN: [id number]  HPI  Here with daughter, pt in wheelchair, sees pain physician for chronic pain to lower back s/p lumbar surgury, and more recently to knees as well, has been advised she would need knee replacements per pt but is considered high risk;  Today with new problem of worsening post neck pain with radiation to both arms and intermittent numbness, ongoing and gradually worse in the past yr, but no bowel or bladder change, fever, wt loss,  worsening UE or LE weakness, or falls.  Nothing seems to make better or worse.  Also has ? Rash to mouth, mild uncomfortable for several months, without improveement with nystatin.  BP at home has been better controlled than here today, very disinclined for med change today.  Pt denies chest pain, increased sob or doe, wheezing, orthopnea, PND, increased LE swelling, palpitations, dizziness or syncope.   Pt denies polydipsia, polyuria.   Needs oxybutinin XL changed due to cost.    Past Medical History  Diagnosis Date  . HYPERLIPIDEMIA 04/18/2007  . Morbid obesity 12/09/2006  . ANEMIA 01/14/2009  . Chronic pain syndrome 02/16/2009  . HYPERTENSION 12/09/2006  . ACUTE URIS OF UNSPECIFIED SITE 10/22/2007  . ALLERGIC RHINITIS 12/09/2006  . ASTHMA 12/09/2006  . COPD 12/09/2006  . Glossitis 01/14/2009  . GERD 12/09/2006  . HIATAL HERNIA 03/01/2010  . OVERACTIVE BLADDER 04/14/2008  . DEGENERATIVE JOINT DISEASE 12/09/2006  . LOW BACK PAIN 12/09/2006  . OSTEOPOROSIS 12/09/2006  . SYNCOPE 11/05/2008  . DIZZINESS 03/30/2010  . INSOMNIA-SLEEP DISORDER-UNSPEC 11/05/2008  . HYPERSOMNIA 08/31/2008  . PALPITATIONS, RECURRENT 01/27/2010  . DYSPNEA 01/27/2010  . Other dysphagia 01/14/2009  . FREQUENCY, URINARY 03/30/2010  . Abdominal pain, right upper quadrant 11/28/2007  . EPIGASTRIC PAIN 01/14/2009  . Abdominal  pain, other specified site 11/28/2007  . Nonspecific (abnormal) findings on radiological  and other examination of body structure 11/05/2008  . TRANSIENT ISCHEMIC ATTACK, HX OF 12/09/2006  . HELICOBACTER PYLORI GASTRITIS, HX OF 12/09/2006  . ABNORMAL CHEST XRAY 11/05/2008  . Gait disorder 09/04/2011   Past Surgical History  Procedure Date  . Tubal ligation   . Lumbar disc surgery   . Colonoscopy     reports that she has quit smoking. Her smoking use included Cigarettes. She has a 12.5 pack-year smoking history. She does not have any smokeless tobacco history on file. She reports that she does not drink alcohol or use illicit drugs. family history includes Cancer in her father; Heart disease in her mother; and Hypertension in her brother. No Known Allergies Review of Systems Review of Systems  Constitutional: Negative for diaphoresis and unexpected weight change.  HENT: Negative for drooling and tinnitus.   Eyes: Negative for photophobia and visual disturbance.  Respiratory: Neg for cough  Gastrointestinal: Negative for vomiting and blood in stool.  Genitourinary: Negative for hematuria and decreased urine volume.  Skin: Negative for color change and wound.  Neurological: Negative for tremors and numbness.  Psychiatric/Behavioral: Negative for decreased concentration. The patient is not hyperactive.      Objective:   Physical Exam BP 142/80  Pulse 82  Temp(Src) 98.6 F (37 C) (Oral)  Ht 5\' 4"  (1.626 m)  Wt 165 lb 6 oz (75.014 kg)  BMI 28.39 kg/m2  SpO2 99% Physical Exam  VS noted, not ill appaering Constitutional: Pt appears well-developed and well-nourished.  HENT: Head: Normocephalic.  Right Ear: External  ear normal.  Left Ear: External ear normal.  Mouth with reddiss rash, no ulcers or swelling Eyes: Conjunctivae and EOM are normal. Pupils are equal, round, and reactive to light.  Neck: Normal range of motion. Neck supple. Spine: nontender Cardiovascular: Normal rate and regular rhythm.   Pulmonary/Chest: Effort normal and breath sounds normal.  Abd:  Soft, NT,  non-distended, + BS Neurological: Pt is alert. Not confused  UE motor 4+/5 bilat with some decreased sens to LT to distal RUE, dtr intact Skin: Skin is warm. No erythema. No rash Psychiatric: Pt behavior is normal. Thought content normal.     Assessment & Plan:

## 2011-09-30 NOTE — Assessment & Plan Note (Signed)
Ok for magic mouthwash asd,  to f/u any worsening symptoms or concerns

## 2011-09-30 NOTE — Assessment & Plan Note (Signed)
stable overall by hx and exam, most recent data reviewed with pt, and pt to continue medical treatment as before, goal BP < 140/90 BP Readings from Last 3 Encounters:  09/29/11 142/80  05/15/11 126/92  04/07/11 132/70

## 2011-10-02 ENCOUNTER — Telehealth: Payer: Self-pay | Admitting: Internal Medicine

## 2011-10-02 MED ORDER — OXYCODONE-ACETAMINOPHEN 5-325 MG PO TABS: 1.0000 | ORAL_TABLET | Freq: Four times a day (QID) | ORAL | Status: DC | PRN

## 2011-10-02 NOTE — Telephone Encounter (Signed)
It was not intended to stop pain medication; refill simply not done I think  Percocet Done hardcopy to dahlia/LIM B

## 2011-10-02 NOTE — Telephone Encounter (Signed)
Caller: [name]/Child; PCP: Oliver Barre; CB#: [phone];  Call regarding Not Sleeping Well; sx started 09/29/11; pt was taken off pain meds for back pain and now she is having a hard time sleeping; did not sleep at all last night on 10/01/11; attempted to verify meds in EPIC but daughter is not sure of meds that pt takes; Triaged[facility] er Sleep Disorders Guideline; See in 72 hr d/t persistent insomnia resulting in < 4 hr sleep per night and not previously evaluated; OFFICE PLEASE REVIEW AND CALL DAUGHTER BACK CONCERING POSSIBLE RX TO BE CALLED IN TO HELP PT SLEEP

## 2011-10-03 NOTE — Telephone Encounter (Signed)
Wendy Torres appears to have total 6 fills starting feb 2013 - should be ok for now

## 2011-10-03 NOTE — Telephone Encounter (Signed)
Informed Sheralyn Boatman the patients daughter of MD's response to request.

## 2011-10-03 NOTE — Telephone Encounter (Signed)
Called the patients daughter Sheralyn Boatman to Physicist, medical.  She stated she also needs her zolpidem refilled please advise

## 2011-10-10 ENCOUNTER — Other Ambulatory Visit: Payer: Self-pay | Admitting: Internal Medicine

## 2011-10-16 ENCOUNTER — Telehealth: Payer: Self-pay

## 2011-10-16 DIAGNOSIS — M5412 Radiculopathy, cervical region: Secondary | ICD-10-CM

## 2011-10-16 NOTE — Telephone Encounter (Signed)
Called the patient informed of MRI to be scheduled stat.

## 2011-10-16 NOTE — Telephone Encounter (Signed)
Very sorry, MRI order was not done;  Is done today "stat" so should be called very soon

## 2011-10-16 NOTE — Telephone Encounter (Signed)
Wendy Torres the patients daughter called to question if the MRI for neck (Discussed at 09/29/11 OV) has been scheduled as the patient has not heard back on appointment. I checked in chart and did not see a referral for this test, please advise

## 2011-10-26 ENCOUNTER — Ambulatory Visit
Admission: RE | Admit: 2011-10-26 | Discharge: 2011-10-26 | Disposition: A | Discharge status: Home / Self Care | Payer: Medicare Other | Source: Ambulatory Visit | Attending: Internal Medicine | Admitting: Internal Medicine

## 2011-10-26 ENCOUNTER — Encounter: Payer: Self-pay | Admitting: Internal Medicine

## 2011-10-26 DIAGNOSIS — M5412 Radiculopathy, cervical region: Secondary | ICD-10-CM

## 2011-10-31 ENCOUNTER — Other Ambulatory Visit: Payer: Self-pay

## 2011-10-31 MED ORDER — OXYCODONE-ACETAMINOPHEN 5-325 MG PO TABS: 1.0000 | ORAL_TABLET | Freq: Four times a day (QID) | ORAL | Status: DC | PRN

## 2011-10-31 NOTE — Telephone Encounter (Signed)
Done hardcopy to robin  June 17 MRI showed marked cervical spondylosis (spine and disc deterioration) but no specific nerve pinching;  Ok to cont same treatment  Ok to re-send letter of results to pt if she wants

## 2011-10-31 NOTE — Telephone Encounter (Signed)
Pt is also requesting results of MRI

## 2011-10-31 NOTE — Telephone Encounter (Signed)
Patient informed to pickup prescription at the front desk. 

## 2011-11-20 ENCOUNTER — Other Ambulatory Visit: Payer: Self-pay | Admitting: *Deleted

## 2011-11-20 MED ORDER — OXYCODONE-ACETAMINOPHEN 5-325 MG PO TABS: 1.0000 | ORAL_TABLET | Freq: Four times a day (QID) | ORAL | Status: DC | PRN

## 2011-11-20 NOTE — Telephone Encounter (Signed)
Informed patients daughter prescription requested is ready for pickup at the front desk.

## 2011-11-20 NOTE — Telephone Encounter (Signed)
Pt's daughter called requesting refill of Percocet-last written 10/31/2011 #60 with 0 refills-please advise.

## 2011-11-20 NOTE — Telephone Encounter (Signed)
Done hardcopy to robin  

## 2011-11-23 ENCOUNTER — Encounter: Payer: Self-pay | Admitting: Internal Medicine

## 2011-11-23 ENCOUNTER — Ambulatory Visit (INDEPENDENT_AMBULATORY_CARE_PROVIDER_SITE_OTHER): Payer: Medicare Other | Admitting: Internal Medicine

## 2011-11-23 VITALS — BP 180/90 | HR 69 | Temp 98.6°F | Ht 64.0 in | Wt 170.0 lb

## 2011-11-23 DIAGNOSIS — G894 Chronic pain syndrome: Secondary | ICD-10-CM

## 2011-11-23 DIAGNOSIS — I10 Essential (primary) hypertension: Secondary | ICD-10-CM

## 2011-11-23 DIAGNOSIS — M7020 Olecranon bursitis, unspecified elbow: Secondary | ICD-10-CM

## 2011-11-23 DIAGNOSIS — M7021 Olecranon bursitis, right elbow: Secondary | ICD-10-CM | POA: Insufficient documentation

## 2011-11-23 DIAGNOSIS — J449 Chronic obstructive pulmonary disease, unspecified: Secondary | ICD-10-CM

## 2011-11-23 MED ORDER — AMLODIPINE BESYLATE 10 MG PO TABS: 10.0000 mg | ORAL_TABLET | Freq: Every day | ORAL | Status: DC

## 2011-11-23 MED ORDER — OXYCODONE-ACETAMINOPHEN 7.5-325 MG PO TABS: 1.0000 | ORAL_TABLET | Freq: Two times a day (BID) | ORAL | Status: DC | PRN

## 2011-11-23 MED ORDER — PREDNISONE 10 MG PO TABS: ORAL_TABLET | ORAL | Status: DC

## 2011-11-23 NOTE — Progress Notes (Signed)
Subjective:    Patient ID: Wendy Torres, female    DOB: 04/23/1941, 71 y.o.   MRN: [id number]  HPI  Here to f/u; has ongoing neck and lower back pain, walks with walker, MRI reveiwed with pt from may 2013 - no nerve impingement found, pain persists as well as P  continues to have recurring LBP without change in severity, bowel or bladder change, fever, wt loss,  worsening LE pain/numbness/weakness, worsening gait change or falls.  Pain overall not as well controlled as before, asks for increased strength percocet.  Incidentally hit right elbow on a hard surface about 1 wk ago, now with pain/swelling to tip of right elbow, without radiation, or neuritic symptoms or pain/weakness/numb.  Nothing makes better or worse.    Pt denies chest pain, increased sob or doe, wheezing, orthopnea, PND, increased LE swelling, palpitations, or syncope.  Pt denies new neurological symptoms such as new headache, or facial or extremity weakness or numbness   Pt denies polydipsia, polyuria.  Has occas dizziness, but overall less it seems to her since she stopped her lasix 3 mo ago, wt overall stable. Past Medical History  Diagnosis Date  . HYPERLIPIDEMIA 04/18/2007  . Morbid obesity 12/09/2006  . ANEMIA 01/14/2009  . Chronic pain syndrome 02/16/2009  . HYPERTENSION 12/09/2006  . ACUTE URIS OF UNSPECIFIED SITE 10/22/2007  . ALLERGIC RHINITIS 12/09/2006  . ASTHMA 12/09/2006  . COPD 12/09/2006  . Glossitis 01/14/2009  . GERD 12/09/2006  . HIATAL HERNIA 03/01/2010  . OVERACTIVE BLADDER 04/14/2008  . DEGENERATIVE JOINT DISEASE 12/09/2006  . LOW BACK PAIN 12/09/2006  . OSTEOPOROSIS 12/09/2006  . SYNCOPE 11/05/2008  . DIZZINESS 03/30/2010  . INSOMNIA-SLEEP DISORDER-UNSPEC 11/05/2008  . HYPERSOMNIA 08/31/2008  . PALPITATIONS, RECURRENT 01/27/2010  . DYSPNEA 01/27/2010  . Other dysphagia 01/14/2009  . FREQUENCY, URINARY 03/30/2010  . Abdominal pain, right upper quadrant 11/28/2007  . EPIGASTRIC PAIN 01/14/2009  . Abdominal  pain, other  specified site 11/28/2007  . Nonspecific (abnormal) findings on radiological and other examination of body structure 11/05/2008  . TRANSIENT ISCHEMIC ATTACK, HX OF 12/09/2006  . HELICOBACTER PYLORI GASTRITIS, HX OF 12/09/2006  . ABNORMAL CHEST XRAY 11/05/2008  . Gait disorder 09/04/2011   Past Surgical History  Procedure Date  . Tubal ligation   . Lumbar disc surgery   . Colonoscopy     reports that she has quit smoking. Her smoking use included Cigarettes. She has a 12.5 pack-year smoking history. She does not have any smokeless tobacco history on file. She reports that she does not drink alcohol or use illicit drugs. family history includes Cancer in her father; Heart disease in her mother; and Hypertension in her brother. No Known Allergies Current Outpatient Prescriptions on File Prior to Visit  Medication Sig Dispense Refill  . alendronate (FOSAMAX) 70 MG tablet TAKE 1 TABLET BY MOUTH WEEKLY  12 tablet  1  . ALPRAZolam (XANAX) 0.25 MG tablet Take 1 tablet (0.25 mg total) by mouth 2 (two) times daily as needed.  60 tablet  2  . aspirin EC 325 MG EC tablet Take 325 mg by mouth once daily.        . benazepril (LOTENSIN) 40 MG tablet Take 1 tablet (40 mg total) by mouth daily.  30 tablet  11  . Cholecalciferol (VITAMIN D3) 1000 UNITS capsule Take 1,000 Units by mouth daily.        . cloNIDine (CATAPRES) 0.2 MG tablet Take 1 tablet (0.2 mg total) by mouth 2 (two)  times daily.  60 tablet  11  . diazepam (VALIUM) 5 MG tablet 1 tab by mouth  - take 45 min before MRI  1 tablet  0  . Diphenhyd-Hydrocort-Nystatin SUSP Use as directed 5 mLs in the mouth or throat 3 (three) times daily as needed. - to swish and spit  240 mL  5  . ferrous sulfate 325 (65 FE) MG tablet TAKE 1 TABLET BY MOUTH EVERY DAY  30 tablet  6  . furosemide (LASIX) 20 MG tablet TAKE 1 TABLET EVERY MORNING  30 tablet  11  . gabapentin (NEURONTIN) 100 MG capsule Take 100 mg by mouth every 8 (eight) hours.        . lovastatin (MEVACOR)  40 MG tablet TAKE 1 TABLET BY MOUTH EVERY DAY  90 tablet  3  . meloxicam (MOBIC) 15 MG tablet TAKE 1 TABLET EVERY DAY  30 tablet  2  . omeprazole (PRILOSEC) 20 MG capsule Take 1 capsule (20 mg total) by mouth daily.  30 capsule  11  . oxybutynin (DITROPAN) 5 MG tablet 2 tabs by mouth twice per day  120 tablet  11  . zolpidem (AMBIEN) 10 MG tablet Take 1 tablet (10 mg total) by mouth at bedtime as needed.  30 tablet  5  . DISCONTD: lansoprazole (PREVACID) 30 MG capsule Take 1 capsule (30 mg total) by mouth daily.  90 capsule  3  . DISCONTD: pantoprazole (PROTONIX) 40 MG tablet 1 tab by mouth twice per day  60 tablet  11   Review of Systems Review of Systems  Constitutional: Negative for diaphoresis and unexpected weight change.  HENT: Negative for drooling and tinnitus.   Eyes: Negative for photophobia and visual disturbance.  Respiratory: Negative for choking and stridor.   Gastrointestinal: Negative for vomiting and blood in stool.  Genitourinary: Negative for hematuria and decreased urine volume.  Musculoskeletal: Negative for acute joint swelling Skin: Negative for color change and wound.  Neurological: Negative for tremors and numbness.  Psychiatric/Behavioral: Negative for decreased concentration. The patient is not hyperactive.      Objective:   Physical Exam BP 180/90  Pulse 69  Temp 98.6 F (37 C) (Oral)  Ht 5\' 4"  (1.626 m)  Wt 170 lb (77.111 kg)  BMI 29.18 kg/m2  SpO2 97% Physical Exam  VS noted Constitutional: Pt appears well-developed and well-nourished.  HENT: Head: Normocephalic.  Right Ear: External ear normal.  Left Ear: External ear normal.  Eyes: Conjunctivae and EOM are normal. Pupils are equal, round, and reactive to light.  Neck: Normal range of motion. Neck supple.  Cardiovascular: Normal rate and regular rhythm.   Pulmonary/Chest: Effort normal and breath sounds normal.  Abd:  Soft, NT, non-distended, + BS Right elbow with FROM, but 1+ bursa tender,  swelling without erythema, fluctuance, drainage Neurological: Pt is alert. Walks stiffly with walker, needs help to stand Skin: Skin is warm. No erythema. No LE edema Psychiatric: Pt behavior is normal. Thought content normal. 1+ nervous    Assessment & Plan:

## 2011-11-23 NOTE — Assessment & Plan Note (Signed)
Post traumatic, ok for low dose prednisone, urged to protect the elbow, and should improve without further tx

## 2011-11-23 NOTE — Assessment & Plan Note (Signed)
Uncontrolled, for increased amlodipine 10 qd, Continue all other medications as before,  to f/u any worsening symptoms or concerns

## 2011-11-23 NOTE — Assessment & Plan Note (Signed)
Ok to incresae the percocet to 7.5/325 bid prn, may need more frequent dosing,  to f/u any worsening symptoms or concerns

## 2011-11-23 NOTE — Patient Instructions (Addendum)
Take all new medications as prescribed - the prednisone Continue all other medications as before, except the pain is increased, and the amlodipine is increased to 10 mg per day Please have the pharmacy call with any refills you may need.

## 2011-11-23 NOTE — Assessment & Plan Note (Signed)
stable overall by hx and exam, most recent data reviewed with pt, and pt to continue medical treatment as before SpO2 Readings from Last 3 Encounters:  11/23/11 97%  09/29/11 99%  04/07/11 97%

## 2011-12-12 ENCOUNTER — Other Ambulatory Visit: Payer: Self-pay

## 2011-12-12 MED ORDER — ZOLPIDEM TARTRATE 10 MG PO TABS: 10.0000 mg | ORAL_TABLET | Freq: Every evening | ORAL | Status: DC | PRN

## 2011-12-12 NOTE — Telephone Encounter (Signed)
Done hardcopy to robin  

## 2011-12-13 NOTE — Telephone Encounter (Signed)
Faxed hardcopy to pharmacy. 

## 2011-12-20 ENCOUNTER — Encounter: Payer: Self-pay | Admitting: Internal Medicine

## 2011-12-20 ENCOUNTER — Telehealth: Payer: Self-pay | Admitting: Internal Medicine

## 2011-12-20 ENCOUNTER — Ambulatory Visit (INDEPENDENT_AMBULATORY_CARE_PROVIDER_SITE_OTHER): Payer: Medicare Other | Admitting: Internal Medicine

## 2011-12-20 VITALS — BP 142/74 | HR 59 | Temp 99.7°F | Ht 64.0 in | Wt 170.0 lb

## 2011-12-20 DIAGNOSIS — G894 Chronic pain syndrome: Secondary | ICD-10-CM

## 2011-12-20 DIAGNOSIS — I10 Essential (primary) hypertension: Secondary | ICD-10-CM

## 2011-12-20 DIAGNOSIS — M7020 Olecranon bursitis, unspecified elbow: Secondary | ICD-10-CM

## 2011-12-20 DIAGNOSIS — M7021 Olecranon bursitis, right elbow: Secondary | ICD-10-CM

## 2011-12-20 MED ORDER — DICLOFENAC SODIUM 1 % TD GEL: 2.0000 g | Freq: Four times a day (QID) | TRANSDERMAL | Status: DC | PRN

## 2011-12-20 MED ORDER — OXYCODONE-ACETAMINOPHEN 7.5-325 MG PO TABS: 1.0000 | ORAL_TABLET | Freq: Three times a day (TID) | ORAL | Status: AC | PRN

## 2011-12-20 NOTE — Patient Instructions (Addendum)
Take all new medications as prescribed Continue all other medications as before Your oxycodone was refilled today

## 2011-12-20 NOTE — Telephone Encounter (Signed)
Caller: [name]/Child; Patient Name: Wendy Torres; PCP: Oliver Barre; Best Callback Phone Number: [phone].  Pt was seen in the office 2 weeks ago for R elbow pain and swelling.  Caller reports pt was diagnosed with bursitis and place on Prednisone and Oxycodone.  Caller reports pt is taking Oxycodone three times a day instead of 2 times a day because of the pain.  Elbow is still swollen and painful.  All emergent symptoms of Elbow Non - Injury Protocol ruled out with exception to 'Swollen, fluid filled sac around elbow and signs of infection'.  Home care advice given.  Caller is trying to arrange transportation for pt to come into office to be seen.  Appt scheduled for 1100 on 12/20/11 with Dr Jonny Ruiz

## 2011-12-24 ENCOUNTER — Encounter: Payer: Self-pay | Admitting: Internal Medicine

## 2011-12-24 NOTE — Assessment & Plan Note (Signed)
stable overall by hx and exam, most recent data reviewed with pt, and pt to continue medical treatment as before Lab Results  Component Value Date   WBC 5.6 04/07/2011   HGB 11.5* 04/07/2011   HCT 33.7* 04/07/2011   PLT 286.0 04/07/2011   GLUCOSE 99 04/07/2011   CHOL 141 04/07/2011   TRIG 45.0 04/07/2011   HDL 58.70 04/07/2011   LDLCALC 73 04/07/2011   ALT 13 04/07/2011   AST 18 04/07/2011   NA 140 04/07/2011   K 3.5 04/07/2011   CL 106 04/07/2011   CREATININE 0.8 04/07/2011   BUN 13 04/07/2011   CO2 28 04/07/2011   TSH 0.56 04/07/2011   INR 0.9 10/17/2008   For med refill

## 2011-12-24 NOTE — Assessment & Plan Note (Signed)
D/w pt, needs to stop placing any pressure at all on the right elbow and arm, in order for the irriation to resolve., consider elbow pad or at least pillow to the right elbow, Continue all other medications as before, consider ortho referral if not improved

## 2011-12-24 NOTE — Progress Notes (Signed)
Subjective:    Patient ID: PENNI PENADO, female    DOB: 06/24/1940, 71 y.o.   MRN: [id number]  HPI  Here to f/u; unfortunately her right olecranon bursitis has worsened as she has cont'd to sit most days in a position with the right arm on a table/chair leaning on it;  Pt denies chest pain, increased sob or doe, wheezing, orthopnea, PND, increased LE swelling, palpitations, dizziness or syncope.   Pt denies polydipsia, polyuria.  Pt denies new neurological symptoms such as new headache, or facial or extremity weakness or numbness   Pt denies fever, wt loss, night sweats, loss of appetite, or other constitutional symptoms Past Medical History  Diagnosis Date  . HYPERLIPIDEMIA 04/18/2007  . Morbid obesity 12/09/2006  . ANEMIA 01/14/2009  . Chronic pain syndrome 02/16/2009  . HYPERTENSION 12/09/2006  . ACUTE URIS OF UNSPECIFIED SITE 10/22/2007  . ALLERGIC RHINITIS 12/09/2006  . ASTHMA 12/09/2006  . COPD 12/09/2006  . Glossitis 01/14/2009  . GERD 12/09/2006  . HIATAL HERNIA 03/01/2010  . OVERACTIVE BLADDER 04/14/2008  . DEGENERATIVE JOINT DISEASE 12/09/2006  . LOW BACK PAIN 12/09/2006  . OSTEOPOROSIS 12/09/2006  . SYNCOPE 11/05/2008  . DIZZINESS 03/30/2010  . INSOMNIA-SLEEP DISORDER-UNSPEC 11/05/2008  . HYPERSOMNIA 08/31/2008  . PALPITATIONS, RECURRENT 01/27/2010  . DYSPNEA 01/27/2010  . Other dysphagia 01/14/2009  . FREQUENCY, URINARY 03/30/2010  . Abdominal pain, right upper quadrant 11/28/2007  . EPIGASTRIC PAIN 01/14/2009  . Abdominal  pain, other specified site 11/28/2007  . Nonspecific (abnormal) findings on radiological and other examination of body structure 11/05/2008  . TRANSIENT ISCHEMIC ATTACK, HX OF 12/09/2006  . HELICOBACTER PYLORI GASTRITIS, HX OF 12/09/2006  . ABNORMAL CHEST XRAY 11/05/2008  . Gait disorder 09/04/2011   Past Surgical History  Procedure Date  . Tubal ligation   . Lumbar disc surgery   . Colonoscopy     reports that she has quit smoking. Her smoking use included Cigarettes.  She has a 12.5 pack-year smoking history. She does not have any smokeless tobacco history on file. She reports that she does not drink alcohol or use illicit drugs. family history includes Cancer in her father; Heart disease in her mother; and Hypertension in her brother. No Known Allergies Current Outpatient Prescriptions on File Prior to Visit  Medication Sig Dispense Refill  . alendronate (FOSAMAX) 70 MG tablet TAKE 1 TABLET BY MOUTH WEEKLY  12 tablet  1  . ALPRAZolam (XANAX) 0.25 MG tablet Take 1 tablet (0.25 mg total) by mouth 2 (two) times daily as needed.  60 tablet  2  . amLODipine (NORVASC) 10 MG tablet Take 1 tablet (10 mg total) by mouth daily.  90 tablet  3  . aspirin EC 325 MG EC tablet Take 325 mg by mouth once daily.        . benazepril (LOTENSIN) 40 MG tablet Take 1 tablet (40 mg total) by mouth daily.  30 tablet  11  . Cholecalciferol (VITAMIN D3) 1000 UNITS capsule Take 1,000 Units by mouth daily.        . cloNIDine (CATAPRES) 0.2 MG tablet Take 1 tablet (0.2 mg total) by mouth 2 (two) times daily.  60 tablet  11  . Diphenhyd-Hydrocort-Nystatin SUSP Use as directed 5 mLs in the mouth or throat 3 (three) times daily as needed. - to swish and spit  240 mL  5  . ferrous sulfate 325 (65 FE) MG tablet TAKE 1 TABLET BY MOUTH EVERY DAY  30 tablet  6  .  furosemide (LASIX) 20 MG tablet TAKE 1 TABLET EVERY MORNING  30 tablet  11  . gabapentin (NEURONTIN) 100 MG capsule Take 100 mg by mouth every 8 (eight) hours.        . lovastatin (MEVACOR) 40 MG tablet TAKE 1 TABLET BY MOUTH EVERY DAY  90 tablet  3  . meloxicam (MOBIC) 15 MG tablet TAKE 1 TABLET EVERY DAY  30 tablet  2  . omeprazole (PRILOSEC) 20 MG capsule Take 1 capsule (20 mg total) by mouth daily.  30 capsule  11  . oxybutynin (DITROPAN) 5 MG tablet 2 tabs by mouth twice per day  120 tablet  11  . zolpidem (AMBIEN) 10 MG tablet Take 1 tablet (10 mg total) by mouth at bedtime as needed.  30 tablet  5  . diazepam (VALIUM) 5 MG tablet  1 tab by mouth  - take 45 min before MRI  1 tablet  0  . DISCONTD: lansoprazole (PREVACID) 30 MG capsule Take 1 capsule (30 mg total) by mouth daily.  90 capsule  3  . DISCONTD: pantoprazole (PROTONIX) 40 MG tablet 1 tab by mouth twice per day  60 tablet  11   Review of Systems Constitutional: Negative for diaphoresis and unexpected weight change.  HENT: Negative for drooling and tinnitus.   Eyes: Negative for photophobia and visual disturbance.  Respiratory: Negative for choking and stridor.   Gastrointestinal: Negative for vomiting and blood in stool.  Genitourinary: Negative for hematuria and decreased urine volume.  Musculoskeletal:  Neg for other acute joint swelling Skin: Negative for color change and wound.  Neurological: Negative for tremors and numbness.  Psychiatric/Behavioral: Negative for decreased concentration. The patient is not hyperactive.      Objective:   Physical Exam BP 142/74  Pulse 59  Temp 99.7 F (37.6 C) (Oral)  Ht 5\' 4"  (1.626 m)  Wt 170 lb (77.111 kg)  BMI 29.18 kg/m2  SpO2 95% Physical Exam  VS noted Constitutional: Pt appears well-developed and well-nourished.  HENT: Head: Normocephalic.  Right Ear: External ear normal.  Left Ear: External ear normal.  Eyes: Conjunctivae and EOM are normal. Pupils are equal, round, and reactive to light.  Neck: Normal range of motion. Neck supple.  Cardiovascular: Normal rate and regular rhythm.   Pulmonary/Chest: Effort normal and breath sounds without rales or wheezing.  Neurological: Pt is alert. Not confused Right elbow with 2+ swelling, mild tender right bursa, non fluctuant, no drainage, o/w no joint effusion, or pain with FROM Psychiatric: Pt behavior is normal. Thought content normal.     Assessment & Plan:

## 2011-12-24 NOTE — Assessment & Plan Note (Signed)
stable overall by hx and exam, most recent data reviewed with pt, and pt to continue medical treatment as before BP Readings from Last 3 Encounters:  12/20/11 142/74  11/23/11 180/90  09/29/11 142/80

## 2012-01-05 ENCOUNTER — Other Ambulatory Visit: Payer: Self-pay

## 2012-01-05 MED ORDER — ALPRAZOLAM 0.25 MG PO TABS: 0.2500 mg | ORAL_TABLET | Freq: Two times a day (BID) | ORAL | Status: DC | PRN

## 2012-01-05 NOTE — Telephone Encounter (Signed)
Done hardcopy to robin  

## 2012-01-08 NOTE — Telephone Encounter (Signed)
Faxed hardcopy to pharmacy. 

## 2012-01-17 ENCOUNTER — Telehealth: Payer: Self-pay | Admitting: Internal Medicine

## 2012-01-17 MED ORDER — OXYCODONE-ACETAMINOPHEN 7.5-325 MG PO TABS: 1.0000 | ORAL_TABLET | Freq: Three times a day (TID) | ORAL | Status: DC | PRN

## 2012-01-17 NOTE — Telephone Encounter (Signed)
Daughter notified and will come by to pick up

## 2012-01-17 NOTE — Telephone Encounter (Signed)
Done hardcopy to robin  

## 2012-01-17 NOTE — Telephone Encounter (Signed)
Caller: [name]/Patient; Phone: [phone]; Reason for Call: Daughter calling to get a refill on patient's Oxycodone.  Daughter thinks she was taking it twice a day and now the doctor has her taking it 3 times a day.  Pleased call the daughter back.  Thanks

## 2012-02-06 ENCOUNTER — Other Ambulatory Visit: Payer: Self-pay | Admitting: Gastroenterology

## 2012-02-15 ENCOUNTER — Telehealth: Payer: Self-pay | Admitting: Internal Medicine

## 2012-02-15 ENCOUNTER — Ambulatory Visit (INDEPENDENT_AMBULATORY_CARE_PROVIDER_SITE_OTHER): Payer: Medicare Other | Admitting: Internal Medicine

## 2012-02-15 ENCOUNTER — Encounter: Payer: Self-pay | Admitting: Internal Medicine

## 2012-02-15 ENCOUNTER — Other Ambulatory Visit: Payer: Medicare Other

## 2012-02-15 VITALS — BP 142/86 | HR 80 | Temp 98.1°F | Resp 16 | Wt 167.0 lb

## 2012-02-15 DIAGNOSIS — M1710 Unilateral primary osteoarthritis, unspecified knee: Secondary | ICD-10-CM

## 2012-02-15 DIAGNOSIS — M7021 Olecranon bursitis, right elbow: Secondary | ICD-10-CM

## 2012-02-15 DIAGNOSIS — M7020 Olecranon bursitis, unspecified elbow: Secondary | ICD-10-CM

## 2012-02-15 MED ORDER — CELECOXIB 200 MG PO CAPS: 200.0000 mg | ORAL_CAPSULE | Freq: Two times a day (BID) | ORAL | Status: DC

## 2012-02-15 NOTE — Patient Instructions (Signed)
Olecranon Bursitis Bursitis is swelling and soreness (inflammation) of a fluid-filled sac (bursa) that covers and protects a joint. Olecranon bursitis occurs over the elbow.  CAUSES Bursitis can be caused by injury, overuse of the joint, arthritis, or infection.  SYMPTOMS   Tenderness, swelling, warmth, or redness over the elbow.  Elbow pain with movement. This is greater with bending the elbow.  Squeaking sound when the bursa is rubbed or moved.  Increasing size of the bursa without pain or discomfort.  Fever with increasing pain and swelling if the bursa becomes infected. HOME CARE INSTRUCTIONS   Put ice on the affected area.  Put ice in a plastic bag.  Place a towel between your skin and the bag.  Leave the ice on for 15 to 20 minutes each hour while awake. Do this for the first 2 days.  When resting, elevate your elbow above the level of your heart. This helps reduce swelling.  Continue to put the joint through a full range of motion 4 times per day. Rest the injured joint at other times. When the pain lessens, begin normal slow movements and usual activities.  Only take over-the-counter or prescription medicines for pain, discomfort, or fever as directed by your caregiver.  Reduce your intake of milk and related dairy products (cheese, yogurt). They may make your condition worse. SEEK IMMEDIATE MEDICAL CARE IF:   Your pain increases even during treatment.  You have a fever.  You have heat and inflammation over the bursa and elbow.  You have a red line that goes up your arm.  You have pain with movement of your elbow. MAKE SURE YOU:   Understand these instructions.  Will watch your condition.  Will get help right away if you are not doing well or get worse. Document Released: 05/17/2006 Document Revised: 07/10/2011 Document Reviewed: 04/02/2007 [employer] Patient Information 2013 [employer].  

## 2012-02-15 NOTE — Telephone Encounter (Signed)
Ok for Wendy Torres to contact pt  If possible, could she increase the neurontin to 200 tid (from 100 tid), and increase the percocet 7.5 from tid to qid  Then we can see tomorrow if she has enough med

## 2012-02-15 NOTE — Telephone Encounter (Signed)
Caller: Sheralyn Boatman McLean/Child; Patient Nam.  Dr. Jonny Ruiz,  Daughter is calling on behalf of her mother. She is pain from severe arthritis and is asking that we caller her mother.   Called her mother [phone].  Called patient and is she crying.  She complains of Arthritis pain in Legs, back and elbows.  She took her Percocet and other prescribed medications but  it does not help.  She is crying with discomfort and states she can hardly walk and is using her walker. LOV 12/20/11. She states pain is awful and she has swelling in right knee and right ankle.  Emergent s/sx ruled out per Leg Non-Injury with exception to "Unbearable Pain". See ED Immediately.  Patient states she does not have a way to office or any place else.  Her family is working and her daughter has to take the bus. PATIENT REQUESTING STRONGER MEDICATION.   ADVISED CALLER I WILL FORWARD A MESSAGE TO THE OFFICE REGARDING INCREASING ARTHRITIS PAIN.  Please contact.

## 2012-02-18 LAB — WOUND CULTURE: Organism ID, Bacteria: NO GROWTH

## 2012-02-18 MED ORDER — METHYLPREDNISOLONE ACETATE 40 MG/ML IJ SUSP: 40.0000 mg | Freq: Once | INTRAMUSCULAR | Status: DC

## 2012-02-18 NOTE — Assessment & Plan Note (Signed)
Fluid removed and steroids were injected, I will check the clx on the fluid, she will rest/ice/elevate, also refer to ortho for evaluation

## 2012-02-18 NOTE — Progress Notes (Signed)
Subjective:    Patient ID: Wendy Torres, female    DOB: [birth year], 71 y.o.   MRN: [id number]  Arthritis Presents for follow-up visit. She complains of pain and joint swelling. She reports no stiffness or joint warmth. Affected locations include the right elbow. Her pain is at a severity of 2/10. Associated symptoms include pain at night and pain while resting. Pertinent negatives include no diarrhea, dry eyes, dry mouth, dysuria, fatigue, fever, rash, Raynaud's syndrome, uveitis or weight loss. Compliance with total regimen is 51-75%.      Review of Systems  Constitutional: Negative for fever, chills, weight loss, diaphoresis, activity change, appetite change, fatigue and unexpected weight change.  HENT: Negative.   Eyes: Negative.   Respiratory: Negative for cough, chest tightness, shortness of breath, wheezing and stridor.   Cardiovascular: Negative for chest pain, palpitations and leg swelling.  Gastrointestinal: Negative.  Negative for diarrhea.  Genitourinary: Negative.  Negative for dysuria.  Musculoskeletal: Positive for joint swelling, arthralgias (both knees) and arthritis. Negative for myalgias, back pain, gait problem and stiffness.  Skin: Negative for color change, pallor, rash and wound.  Neurological: Negative.   Hematological: Negative for adenopathy. Does not bruise/bleed easily.  Psychiatric/Behavioral: Negative.        Objective:   Physical Exam  Vitals reviewed. Constitutional: She is oriented to person, place, and time. She appears well-developed and well-nourished. No distress.  HENT:  Head: Normocephalic and atraumatic.  Mouth/Throat: No oropharyngeal exudate.  Eyes: Conjunctivae normal are normal. Right eye exhibits no discharge. Left eye exhibits no discharge. No scleral icterus.  Neck: Normal range of motion. Neck supple. No JVD present. No tracheal deviation present. No thyromegaly present.  Cardiovascular: Normal rate, regular rhythm and normal heart  sounds.  Exam reveals no gallop and no friction rub.   No murmur heard. Pulmonary/Chest: Effort normal and breath sounds normal. No stridor. No respiratory distress. She has no wheezes. She has no rales. She exhibits no tenderness.  Abdominal: Soft. Bowel sounds are normal. She exhibits no distension and no mass. There is no tenderness. There is no rebound and no guarding.  Musculoskeletal: Normal range of motion. She exhibits no edema and no tenderness.       Right elbow: She exhibits normal range of motion, no swelling, no effusion, no deformity and no laceration. tenderness found. Olecranon process (large area of swelling over the bursa) tenderness noted. No radial head, no medial epicondyle and no lateral epicondyle tenderness noted.       Right knee: She exhibits deformity (severe DJD). She exhibits normal range of motion, no swelling, no effusion, no ecchymosis, no laceration, no erythema, normal alignment, no LCL laxity and normal patellar mobility. no tenderness found.       Left knee: She exhibits deformity (severe DJD). She exhibits normal range of motion, no swelling, no effusion, no ecchymosis, no laceration, no erythema, normal alignment, no LCL laxity and normal patellar mobility. tenderness found.       Right elbow was cleaned with betadine then prepped and draped in sterile fashion, local anesthesia was obtained with lido 2% with epi, then a 25 gauge needle was used to enter the bursa and about 7 cc of bloody fluid was removed, then the syringe was changed and 1/2 cc of depo-medrol 40 mg was injected into the bursa. She tolerated this well with no complications or blood loss.  Lymphadenopathy:    She has no cervical adenopathy.  Neurological: She is oriented to person, place, and time.  Skin: Skin is warm and dry. No rash noted. She is not diaphoretic. No erythema. No pallor.  Psychiatric: She has a normal mood and affect. Her behavior is normal. Judgment and thought content normal.           Assessment & Plan:

## 2012-02-18 NOTE — Assessment & Plan Note (Signed)
Ortho referral (I think she may benefit from TKR) and try celebrex

## 2012-02-19 NOTE — Telephone Encounter (Signed)
Were you able to contact pt?

## 2012-02-20 MED ORDER — OXYCODONE-ACETAMINOPHEN 7.5-325 MG PO TABS: 1.0000 | ORAL_TABLET | Freq: Four times a day (QID) | ORAL | Status: DC | PRN

## 2012-02-20 MED ORDER — GABAPENTIN 100 MG PO CAPS: ORAL_CAPSULE | ORAL | Status: DC

## 2012-02-20 NOTE — Telephone Encounter (Signed)
Called the patient and she is doing better today.  She is completely out of gabapentin and percocet, but has been taking some samples (she could not remember the name) given to her at [facility] that are helping.  She would like her gabapentin and percocet refilled please as is completely out.

## 2012-02-20 NOTE — Telephone Encounter (Signed)
Percocet Done hardcopy to [facility]  gabapentin done erx

## 2012-02-20 NOTE — Telephone Encounter (Signed)
Called the patient informed Gabapentin sent to pharmacy and hardcopy ready for pickup at the front desk for percocet.

## 2012-02-21 ENCOUNTER — Telehealth: Payer: Self-pay | Admitting: Internal Medicine

## 2012-02-21 MED ORDER — GABAPENTIN 300 MG PO CAPS: 300.0000 mg | ORAL_CAPSULE | Freq: Three times a day (TID) | ORAL | Status: DC

## 2012-02-21 NOTE — Telephone Encounter (Signed)
I had not recalled that Ms Wendy Torres was seeing pain management  Please cancel the gabapentin and oxycodone rx from me, as she should get these only from one MD

## 2012-02-21 NOTE — Telephone Encounter (Signed)
Caller: [name]/ Daughter; Patient Name: Wendy Torres; PCP: Oliver Barre (Adults only); Best Callback Phone Number: [phone]. Caller relates Maytal was seen on 02/16/12 and received Rx for Gabapentin; she was already  Gabapentin 2 tablets by mouth TID is ordered at the visit. Caller states she was on 300 mg TID by her Pain Management provider.  Asked for clarification as to whether she should continue dosing from other provider or new Rx bay Dr Jonny Ruiz.  Pain medication was changed- caller states patient has not filled Rx for  Oxycodone 7.5/325 as of this date as she has Oxycodone at home from previous condition.   Information noted and sent to office for clarification regarding question related to Gabapentin dose.

## 2012-02-21 NOTE — Telephone Encounter (Signed)
Pt has oxycodone from prevoius  OK to follow for now

## 2012-02-21 NOTE — Telephone Encounter (Signed)
Called the pharmacy to cancel rx.  The patient had already picked up the Gabapentin rx, but not the oxy. They did cancel the oxy.  The pharmacist stated the patient has only gotten oxycodone from Dr. Jonny Ruiz and from Dr. Margarita Rana she has gotten gabapentin and mobic.

## 2012-02-22 ENCOUNTER — Other Ambulatory Visit: Payer: Self-pay | Admitting: Internal Medicine

## 2012-02-22 MED ORDER — OXYCODONE-ACETAMINOPHEN 7.5-325 MG PO TABS: 1.0000 | ORAL_TABLET | Freq: Four times a day (QID) | ORAL | Status: DC | PRN

## 2012-02-22 NOTE — Telephone Encounter (Signed)
Caller: [name]/Patient; Patient Name: Wendy Torres; PCP: Rene Paci (Adults only); Best Callback Phone Number: [phone] Daughter- Sheralyn Boatman calling  regarding script for  mother for Oxycodone.  States when her sister went to pick up script for pain med. on 02/21/12, Dr. Jonny Ruiz had cancelled script.  Daughter states mother had script for Gabapentin at home from previous MD that she was using  but did not have prior script for Oxycodone at home as docummented in prior note dated 02/21/12.  . Daughter is requesting new script for Oxycodone Generic 7.5/ 325mg . [facility] [address], ph# [phone]. States patient is no longer being seen at pain clinic due to cost and Dr. Jonny Ruiz has been managing this script for patiient. Will send note  to Dr. Jonny Ruiz.

## 2012-02-22 NOTE — Telephone Encounter (Signed)
Called informed the patients daughter of prescription hardcopy ready for pickup.

## 2012-02-22 NOTE — Telephone Encounter (Signed)
Sorry for the confusion  OK for pain med refill - Done hardcopy to robin

## 2012-02-23 ENCOUNTER — Ambulatory Visit: Payer: Medicare Other | Admitting: Internal Medicine

## 2012-02-26 ENCOUNTER — Ambulatory Visit: Payer: Medicare Other | Admitting: Internal Medicine

## 2012-02-28 ENCOUNTER — Ambulatory Visit (INDEPENDENT_AMBULATORY_CARE_PROVIDER_SITE_OTHER): Payer: Medicare Other | Admitting: Internal Medicine

## 2012-02-28 ENCOUNTER — Encounter: Payer: Self-pay | Admitting: Internal Medicine

## 2012-02-28 ENCOUNTER — Ambulatory Visit: Payer: Medicare Other | Admitting: Internal Medicine

## 2012-02-28 VITALS — BP 152/84 | HR 52 | Temp 98.3°F | Ht 64.0 in | Wt 169.4 lb

## 2012-02-28 DIAGNOSIS — I10 Essential (primary) hypertension: Secondary | ICD-10-CM

## 2012-02-28 DIAGNOSIS — IMO0002 Reserved for concepts with insufficient information to code with codable children: Secondary | ICD-10-CM

## 2012-02-28 DIAGNOSIS — Z23 Encounter for immunization: Secondary | ICD-10-CM

## 2012-02-28 DIAGNOSIS — M1710 Unilateral primary osteoarthritis, unspecified knee: Secondary | ICD-10-CM

## 2012-02-28 DIAGNOSIS — M7021 Olecranon bursitis, right elbow: Secondary | ICD-10-CM

## 2012-02-28 DIAGNOSIS — M7020 Olecranon bursitis, unspecified elbow: Secondary | ICD-10-CM

## 2012-02-28 MED ORDER — CELECOXIB 200 MG PO CAPS: 200.0000 mg | ORAL_CAPSULE | Freq: Two times a day (BID) | ORAL | Status: DC

## 2012-02-28 MED ORDER — ALENDRONATE SODIUM 70 MG PO TABS: 70.0000 mg | ORAL_TABLET | ORAL | Status: DC

## 2012-02-28 NOTE — Assessment & Plan Note (Signed)
Nicely resolved, ok to follow, pt reassured

## 2012-02-28 NOTE — Assessment & Plan Note (Signed)
Mild uncontrolled, I think could use further antiBP med, but she declines at this time,  to f/u any worsening symptoms or concerns  BP Readings from Last 3 Encounters:  02/28/12 152/84  02/15/12 142/86  12/20/11 142/74

## 2012-02-28 NOTE — Assessment & Plan Note (Signed)
Ok for cont'd change of mobic to celebrex, f/u with labs next visit, warned pt of possible increased cost

## 2012-02-28 NOTE — Progress Notes (Signed)
Subjective:    Patient ID: Wendy Torres, female    DOB: 12/21/1940, 71 y.o.   MRN: [id number]  HPI  Here to f/u, tx or right olecranon bursitis per Dr Yetta Barre now resolved, BP has been up and down at home, "down" mostly and not wanting further meds as this time, wary of side effects.  Overall good compliance with treatment, and good medicine tolerability.   Pt denies chest pain, increased sob or doe, wheezing, orthopnea, PND, increased LE swelling, palpitations, dizziness or syncope.  Pt denies new neurological symptoms such as new headache, or facial or extremity weakness or numbness   Pt denies polydipsia, polyuria,  Pt states overall good compliance with meds, trying to follow lower cholesterol diet, wt overall stable but little exercise however, now walking with rolling walker.  Overall pain is some better controlled with change of mobic to celebrex with samples last visit, asks for rx, though is unaware it might be more expensive. No other new complaints Past Medical History  Diagnosis Date  . HYPERLIPIDEMIA 04/18/2007  . Morbid obesity 12/09/2006  . ANEMIA 01/14/2009  . Chronic pain syndrome 02/16/2009  . HYPERTENSION 12/09/2006  . ACUTE URIS OF UNSPECIFIED SITE 10/22/2007  . ALLERGIC RHINITIS 12/09/2006  . ASTHMA 12/09/2006  . COPD 12/09/2006  . Glossitis 01/14/2009  . GERD 12/09/2006  . HIATAL HERNIA 03/01/2010  . OVERACTIVE BLADDER 04/14/2008  . DEGENERATIVE JOINT DISEASE 12/09/2006  . LOW BACK PAIN 12/09/2006  . OSTEOPOROSIS 12/09/2006  . SYNCOPE 11/05/2008  . DIZZINESS 03/30/2010  . INSOMNIA-SLEEP DISORDER-UNSPEC 11/05/2008  . HYPERSOMNIA 08/31/2008  . PALPITATIONS, RECURRENT 01/27/2010  . DYSPNEA 01/27/2010  . Other dysphagia 01/14/2009  . FREQUENCY, URINARY 03/30/2010  . Abdominal pain, right upper quadrant 11/28/2007  . EPIGASTRIC PAIN 01/14/2009  . Abdominal  pain, other specified site 11/28/2007  . Nonspecific (abnormal) findings on radiological and other examination of body structure  11/05/2008  . TRANSIENT ISCHEMIC ATTACK, HX OF 12/09/2006  . HELICOBACTER PYLORI GASTRITIS, HX OF 12/09/2006  . ABNORMAL CHEST XRAY 11/05/2008  . Gait disorder 09/04/2011   Past Surgical History  Procedure Date  . Tubal ligation   . Lumbar disc surgery   . Colonoscopy     reports that she has quit smoking. Her smoking use included Cigarettes. She has a 12.5 pack-year smoking history. She does not have any smokeless tobacco history on file. She reports that she does not drink alcohol or use illicit drugs. family history includes Cancer in her father; Heart disease in her mother; and Hypertension in her brother. No Known Allergies Current Outpatient Prescriptions on File Prior to Visit  Medication Sig Dispense Refill  . ALPRAZolam (XANAX) 0.25 MG tablet Take 1 tablet (0.25 mg total) by mouth 2 (two) times daily as needed.  60 tablet  3  . amLODipine (NORVASC) 10 MG tablet Take 1 tablet (10 mg total) by mouth daily.  90 tablet  3  . aspirin EC 325 MG EC tablet Take 325 mg by mouth once daily.        . benazepril (LOTENSIN) 40 MG tablet Take 1 tablet (40 mg total) by mouth daily.  30 tablet  11  . Cholecalciferol (VITAMIN D3) 1000 UNITS capsule Take 1,000 Units by mouth daily.        . cloNIDine (CATAPRES) 0.2 MG tablet Take 1 tablet (0.2 mg total) by mouth 2 (two) times daily.  60 tablet  11  . diclofenac sodium (VOLTAREN) 1 % GEL Apply 2 g topically 4 (four)  times daily as needed.  100 g  1  . ferrous sulfate 325 (65 FE) MG tablet TAKE 1 TABLET BY MOUTH EVERY DAY  30 tablet  0  . furosemide (LASIX) 20 MG tablet TAKE 1 TABLET EVERY MORNING  30 tablet  11  . gabapentin (NEURONTIN) 300 MG capsule Take 1 capsule (300 mg total) by mouth 3 (three) times daily.  90 capsule  3  . lovastatin (MEVACOR) 40 MG tablet TAKE 1 TABLET BY MOUTH EVERY DAY  90 tablet  3  . omeprazole (PRILOSEC) 20 MG capsule Take 1 capsule (20 mg total) by mouth daily.  30 capsule  11  . oxybutynin (DITROPAN) 5 MG tablet 2 tabs by  mouth twice per day  120 tablet  11  . oxyCODONE-acetaminophen (PERCOCET) 7.5-325 MG per tablet Take 1 tablet by mouth every 6 (six) hours as needed for pain.  120 tablet  0  . zolpidem (AMBIEN) 10 MG tablet Take 1 tablet (10 mg total) by mouth at bedtime as needed.  30 tablet  5  . Diphenhyd-Hydrocort-Nystatin SUSP Use as directed 5 mLs in the mouth or throat 3 (three) times daily as needed. - to swish and spit  240 mL  5  . DISCONTD: lansoprazole (PREVACID) 30 MG capsule Take 1 capsule (30 mg total) by mouth daily.  90 capsule  3  . DISCONTD: pantoprazole (PROTONIX) 40 MG tablet 1 tab by mouth twice per day  60 tablet  11   Review of Systems  Constitutional: Negative for diaphoresis and unexpected weight change.  HENT: Negative for tinnitus.   Eyes: Negative for photophobia and visual disturbance.  Respiratory: Negative for choking and stridor.   Gastrointestinal: Negative for vomiting and blood in stool.  Genitourinary: Negative for hematuria and decreased urine volume.  Musculoskeletal: Negative for gait problem.  Skin: Negative for color change and wound.  Neurological: Negative for tremors and numbness.  Psychiatric/Behavioral: Negative for decreased concentration. The patient is not hyperactive.       Objective:   Physical Exam BP 152/84  Pulse 52  Temp 98.3 F (36.8 C) (Oral)  Ht 5\' 4"  (1.626 m)  Wt 169 lb 6 oz (76.828 kg)  BMI 29.07 kg/m2  SpO2 97% Physical Exam  VS noted Constitutional: Pt appears well-developed and well-nourished.  HENT: Head: Normocephalic.  Right Ear: External ear normal.  Left Ear: External ear normal.  Eyes: Conjunctivae and EOM are normal. Pupils are equal, round, and reactive to light.  Neck: Normal range of motion. Neck supple.  Cardiovascular: Normal rate and regular rhythm.   Pulmonary/Chest: Effort normal and breath sounds normal.  Neurological: Pt is alert. Not confused  Skin: Skin is warm. No erythema.  No joint effusions or  synovitis Right elbow without swelling or tender today Psychiatric: Pt behavior is normal. Thought content normal. 1+ nervous    Assessment & Plan:  k

## 2012-02-28 NOTE — Patient Instructions (Addendum)
You had the flu shot today OK to continue the gabapentin at 300 mg - three times per day Continue all other medications as before - including the celebrex if not too expensive,  And to stop the mobic (sorry we did not have the celebrex samples today) Your medications were refilled today as you requested Please return in 2 mo with Lab testing done 3-5 days before  OK to cancel the Nov 14 appt

## 2012-03-14 ENCOUNTER — Ambulatory Visit: Payer: Medicare Other | Admitting: Internal Medicine

## 2012-03-21 ENCOUNTER — Other Ambulatory Visit: Payer: Self-pay | Admitting: Gastroenterology

## 2012-03-25 ENCOUNTER — Other Ambulatory Visit: Payer: Self-pay

## 2012-03-25 MED ORDER — OXYCODONE-ACETAMINOPHEN 7.5-325 MG PO TABS: 1.0000 | ORAL_TABLET | Freq: Four times a day (QID) | ORAL | Status: DC | PRN

## 2012-03-25 NOTE — Telephone Encounter (Signed)
Done hardcopy to robin  

## 2012-03-25 NOTE — Telephone Encounter (Signed)
Patient informed prescription requested is ready for pickup at the front desk. 

## 2012-04-04 ENCOUNTER — Other Ambulatory Visit: Payer: Self-pay | Admitting: Internal Medicine

## 2012-04-05 ENCOUNTER — Other Ambulatory Visit: Payer: Self-pay | Admitting: Internal Medicine

## 2012-04-22 ENCOUNTER — Other Ambulatory Visit: Payer: Self-pay

## 2012-04-22 MED ORDER — OXYCODONE-ACETAMINOPHEN 7.5-325 MG PO TABS: 1.0000 | ORAL_TABLET | Freq: Four times a day (QID) | ORAL | Status: DC | PRN

## 2012-04-22 NOTE — Telephone Encounter (Signed)
Called the patient informed to pickup hardcopy at the front desk. 

## 2012-04-22 NOTE — Telephone Encounter (Signed)
Done hardcopy to robin  

## 2012-04-23 ENCOUNTER — Other Ambulatory Visit: Payer: Self-pay | Admitting: Internal Medicine

## 2012-04-30 ENCOUNTER — Telehealth: Payer: Self-pay | Admitting: *Deleted

## 2012-04-30 NOTE — Telephone Encounter (Signed)
Wendy Torres with [facility] called concerning forms for patient that were faxed on 04/05/2012 , of Medication Review forms. CB#[phone]

## 2012-05-01 NOTE — Telephone Encounter (Signed)
I'm not sure which forms to which she refers  Please re-fax, or let us know if she has a specific question by phone

## 2012-05-08 ENCOUNTER — Ambulatory Visit: Payer: Medicare Other | Admitting: Internal Medicine

## 2012-05-15 ENCOUNTER — Encounter: Payer: Self-pay | Admitting: Internal Medicine

## 2012-05-15 ENCOUNTER — Other Ambulatory Visit (INDEPENDENT_AMBULATORY_CARE_PROVIDER_SITE_OTHER): Payer: Medicare Other

## 2012-05-15 ENCOUNTER — Ambulatory Visit (INDEPENDENT_AMBULATORY_CARE_PROVIDER_SITE_OTHER): Payer: Medicare Other | Admitting: Internal Medicine

## 2012-05-15 VITALS — BP 180/102 | HR 72 | Temp 98.4°F | Ht 64.0 in | Wt 175.0 lb

## 2012-05-15 DIAGNOSIS — I10 Essential (primary) hypertension: Secondary | ICD-10-CM

## 2012-05-15 DIAGNOSIS — M810 Age-related osteoporosis without current pathological fracture: Secondary | ICD-10-CM

## 2012-05-15 DIAGNOSIS — M25561 Pain in right knee: Secondary | ICD-10-CM

## 2012-05-15 DIAGNOSIS — Z0000 Encounter for general adult medical examination without abnormal findings: Secondary | ICD-10-CM

## 2012-05-15 DIAGNOSIS — G894 Chronic pain syndrome: Secondary | ICD-10-CM

## 2012-05-15 DIAGNOSIS — R42 Dizziness and giddiness: Secondary | ICD-10-CM

## 2012-05-15 DIAGNOSIS — M1710 Unilateral primary osteoarthritis, unspecified knee: Secondary | ICD-10-CM

## 2012-05-15 DIAGNOSIS — M25569 Pain in unspecified knee: Secondary | ICD-10-CM

## 2012-05-15 DIAGNOSIS — M25551 Pain in right hip: Secondary | ICD-10-CM

## 2012-05-15 DIAGNOSIS — IMO0002 Reserved for concepts with insufficient information to code with codable children: Secondary | ICD-10-CM

## 2012-05-15 DIAGNOSIS — R609 Edema, unspecified: Secondary | ICD-10-CM

## 2012-05-15 LAB — CBC WITH DIFFERENTIAL/PLATELET
Basophils Relative: 0.8 % (ref 0.0–3.0)
Eosinophils Relative: 4.1 % (ref 0.0–5.0)
Hemoglobin: 12.3 g/dL (ref 12.0–15.0)
Lymphocytes Relative: 46.9 % — ABNORMAL HIGH (ref 12.0–46.0)
MCV: 84.5 fl (ref 78.0–100.0)
Monocytes Absolute: 0.7 10*3/uL (ref 0.1–1.0)
Neutro Abs: 2.4 10*3/uL (ref 1.4–7.7)
Neutrophils Relative %: 37.4 % — ABNORMAL LOW (ref 43.0–77.0)
RBC: 4.39 Mil/uL (ref 3.87–5.11)
WBC: 6.3 10*3/uL (ref 4.5–10.5)

## 2012-05-15 LAB — URINALYSIS, ROUTINE W REFLEX MICROSCOPIC
Nitrite: NEGATIVE
Total Protein, Urine: NEGATIVE
pH: 7 (ref 5.0–8.0)

## 2012-05-15 LAB — HEPATIC FUNCTION PANEL
Albumin: 4.1 g/dL (ref 3.5–5.2)
Bilirubin, Direct: 0 mg/dL (ref 0.0–0.3)
Total Protein: 8 g/dL (ref 6.0–8.3)

## 2012-05-15 LAB — LIPID PANEL
HDL: 52.9 mg/dL (ref >39.00)
Total CHOL/HDL Ratio: 3
Triglycerides: 81 mg/dL (ref 0.0–149.0)

## 2012-05-15 LAB — BASIC METABOLIC PANEL
BUN: 11 mg/dL (ref 6–23)
CO2: 28 mEq/L (ref 19–32)
Calcium: 10.2 mg/dL (ref 8.4–10.5)
Creatinine, Ser: 0.8 mg/dL (ref 0.4–1.2)

## 2012-05-15 MED ORDER — OXYCODONE-ACETAMINOPHEN 10-325 MG PO TABS: 1.0000 | ORAL_TABLET | Freq: Four times a day (QID) | ORAL | Status: DC | PRN

## 2012-05-15 MED ORDER — CLONIDINE HCL 0.3 MG PO TABS: 0.3000 mg | ORAL_TABLET | Freq: Two times a day (BID) | ORAL | Status: DC

## 2012-05-15 NOTE — Assessment & Plan Note (Signed)
Uncontrolled, to increase the clonidine to .3 bid, warned of side effects such as dry mouth

## 2012-05-15 NOTE — Assessment & Plan Note (Signed)
To d/c fosamax, due for dxa - will order

## 2012-05-15 NOTE — Assessment & Plan Note (Signed)
Right > left, will refer back to orthopedic as pt now willing, hold on PT for now

## 2012-05-15 NOTE — Assessment & Plan Note (Signed)

## 2012-05-15 NOTE — Patient Instructions (Addendum)
Please consider asking if [facility] covers the shingles shot; if so, please return for a nurse appointment Please continue your efforts at being more active, low cholesterol diet, and weight control, as you can Please schedule the bone density test before leaving today at the scheduling desk (where you check out) You are otherwise up to date with prevention measures today. Please continue all other medications as before, and refills have been done if requested, EXCEPT: OK to stop the celebrex since you haven't been taking it OK to stop the fosamax as you have been taking more than 5 yrs OK to increase (change) the clonidine to the 0.3 mg twice per day for the Blood Pressure OK to increase the Percocet pain med to the 10/325 mg's You will be contacted regarding the referral for: Orthopedic for the knees (Dr Lequita Halt) We need to hold on the Physical Therapy for now until you are seen by orthopedic You will be contacted regarding the referral for: carotid dopplers Please schedule the bone density test before leaving today at the scheduling desk (where you check out) Please go to the LAB in the Basement (turn left off the elevator) for the tests to be done today You will be contacted by phone if any changes need to be made immediately.  Otherwise, you will receive a letter about your results with an explanation Please remember to sign up for My Chart if you have not done so, as this will be important to you in the future with finding out test results, communicating by private email, and scheduling acute appointments online when needed. Please keep your appointments with your specialists as you have planned - opthamology You should also go to [facility] on [address] to look into knee high compression stockings for both legs to wear during the day only Please return in 6 months, or sooner if needed

## 2012-05-15 NOTE — Progress Notes (Signed)
Subjective:    Patient ID: Wendy Torres, female    DOB: [birth year], 72 y.o.   MRN: [id number]  HPI  Here for wellness and f/u;  Overall doing ok;  Pt denies CP, worsening SOB, DOE, wheezing, orthopnea, PND, worsening LE edema (except occurs to feet most night, gone in the AM), palpitations, or syncope, but has had recurrent minor episodes dizziness with blurred vision with postural change in the past 1-2 mo, today happened to be mild more severe. No recent decreased po intake.   Pt denies neurological change such as new headache, facial or extremity weakness.  Pt denies polydipsia, polyuria, or low sugar symptoms. Pt states overall good compliance with treatment and medications, good tolerability, and has been trying to follow lower cholesterol diet.  Pt denies worsening depressive symptoms, suicidal ideation or panic. No fever, night sweats, wt loss, loss of appetite, or other constitutional symptoms.  Pt states good ability with ADL's, has low fall risk, home safety reviewed and adequate, no other significant changes in hearing or vision, and only occasionally active with exercise.  Due for regular f/u opthamology in the next month. Chronic pain no longer controlled to knees and hips (though back is not as much of a problem); has cont'd to defer TKR but might want to do it soon due to the pain.  Pt suggests she might need PT.  Has been on fosamax for > 5 yrs, due for dxa f/u as well Past Medical History  Diagnosis Date  . HYPERLIPIDEMIA 04/18/2007  . Morbid obesity 12/09/2006  . ANEMIA 01/14/2009  . Chronic pain syndrome 02/16/2009  . HYPERTENSION 12/09/2006  . ACUTE URIS OF UNSPECIFIED SITE 10/22/2007  . ALLERGIC RHINITIS 12/09/2006  . ASTHMA 12/09/2006  . COPD 12/09/2006  . Glossitis 01/14/2009  . GERD 12/09/2006  . HIATAL HERNIA 03/01/2010  . OVERACTIVE BLADDER 04/14/2008  . DEGENERATIVE JOINT DISEASE 12/09/2006  . LOW BACK PAIN 12/09/2006  . OSTEOPOROSIS 12/09/2006  . SYNCOPE 11/05/2008  . DIZZINESS  03/30/2010  . INSOMNIA-SLEEP DISORDER-UNSPEC 11/05/2008  . HYPERSOMNIA 08/31/2008  . PALPITATIONS, RECURRENT 01/27/2010  . DYSPNEA 01/27/2010  . Other dysphagia 01/14/2009  . FREQUENCY, URINARY 03/30/2010  . Abdominal pain, right upper quadrant 11/28/2007  . EPIGASTRIC PAIN 01/14/2009  . Abdominal  pain, other specified site 11/28/2007  . Nonspecific (abnormal) findings on radiological and other examination of body structure 11/05/2008  . TRANSIENT ISCHEMIC ATTACK, HX OF 12/09/2006  . HELICOBACTER PYLORI GASTRITIS, HX OF 12/09/2006  . ABNORMAL CHEST XRAY 11/05/2008  . Gait disorder 09/04/2011   Past Surgical History  Procedure Date  . Tubal ligation   . Lumbar disc surgery   . Colonoscopy     reports that she has quit smoking. Her smoking use included Cigarettes. She has a 12.5 pack-year smoking history. She does not have any smokeless tobacco history on file. She reports that she does not drink alcohol or use illicit drugs. family history includes Cancer in her father; Heart disease in her mother; and Hypertension in her brother. No Known Allergies Current Outpatient Prescriptions on File Prior to Visit  Medication Sig Dispense Refill  . ALPRAZolam (XANAX) 0.25 MG tablet Take 1 tablet (0.25 mg total) by mouth 2 (two) times daily as needed.  60 tablet  3  . amLODipine (NORVASC) 10 MG tablet Take 1 tablet (10 mg total) by mouth daily.  90 tablet  3  . aspirin EC 325 MG EC tablet Take 325 mg by mouth once daily.        Marland Kitchen  benazepril (LOTENSIN) 40 MG tablet TAKE 1 TABLET BY MOUTH EVERY DAY  30 tablet  11  . Cholecalciferol (VITAMIN D3) 1000 UNITS capsule Take 1,000 Units by mouth daily.        . diclofenac sodium (VOLTAREN) 1 % GEL Apply 2 g topically 4 (four) times daily as needed.  100 g  1  . Diphenhyd-Hydrocort-Nystatin SUSP Use as directed 5 mLs in the mouth or throat 3 (three) times daily as needed. - to swish and spit  240 mL  5  . ferrous sulfate 325 (65 FE) MG tablet TAKE 1 TABLET BY MOUTH EVERY  DAY  30 tablet  0  . furosemide (LASIX) 20 MG tablet TAKE 1 TABLET EVERY MORNING  30 tablet  11  . gabapentin (NEURONTIN) 300 MG capsule Take 1 capsule (300 mg total) by mouth 3 (three) times daily.  90 capsule  3  . lovastatin (MEVACOR) 40 MG tablet TAKE 1 TABLET BY MOUTH EVERY DAY  90 tablet  3  . omeprazole (PRILOSEC) 20 MG capsule Take 1 capsule (20 mg total) by mouth daily.  30 capsule  11  . oxybutynin (DITROPAN) 5 MG tablet 2 tabs by mouth twice per day  120 tablet  11  . zolpidem (AMBIEN) 10 MG tablet Take 1 tablet (10 mg total) by mouth at bedtime as needed.  30 tablet  5  . [DISCONTINUED] lansoprazole (PREVACID) 30 MG capsule Take 1 capsule (30 mg total) by mouth daily.  90 capsule  3  . [DISCONTINUED] pantoprazole (PROTONIX) 40 MG tablet 1 tab by mouth twice per day  60 tablet  11   Review of Systems Constitutional: Negative for diaphoresis, activity change, appetite change or unexpected weight change.  HENT: Negative for hearing loss, ear pain, facial swelling, mouth sores and neck stiffness.   Eyes: Negative for pain, redness and visual disturbance.  Respiratory: Negative for shortness of breath and wheezing.   Cardiovascular: Negative for chest pain and palpitations.  Gastrointestinal: Negative for diarrhea, blood in stool, abdominal distention or other pain Genitourinary: Negative for hematuria, flank pain or change in urine volume.  Musculoskeletal: Negative for myalgias and joint swelling.  Skin: Negative for color change and wound.  Neurological: Negative for syncope and numbness. other than noted Hematological: Negative for adenopathy.  Psychiatric/Behavioral: Negative for hallucinations, self-injury, decreased concentration and agitation.      Objective:   Physical Exam BP 180/102  Pulse 72  Temp 98.4 F (36.9 C) (Oral)  Ht 5\' 4"  (1.626 m)  Wt 175 lb (79.379 kg)  BMI 30.04 kg/m2  SpO2 98% VS noted,  Constitutional: Pt is oriented to person, place, and time.  Appears well-developed and well-nourished.  Head: Normocephalic and atraumatic.  Right Ear: External ear normal.  Left Ear: External ear normal.  Nose: Nose normal.  Mouth/Throat: Oropharynx is clear and moist.  Eyes: Conjunctivae and EOM are normal. Pupils are equal, round, and reactive to light.  Neck: Normal range of motion. Neck supple. No JVD present. No tracheal deviation present.  Cardiovascular: Normal rate, regular rhythm, normal heart sounds and intact distal pulses.   Pulmonary/Chest: Effort normal and breath sounds normal.  Abdominal: Soft. Bowel sounds are normal. There is no tenderness. No HSM  Musculoskeletal: Normal range of motion. Exhibits no edema.  Lymphadenopathy:  Has no cervical adenopathy.  Neurological: Pt is alert and oriented to person, place, and time. Pt has normal reflexes. No cranial nerve deficit.  Skin: Skin is warm and dry. No rash noted.  Psychiatric:  Has  normal mood and affect. Behavior is normal.     Assessment & Plan:

## 2012-05-15 NOTE — Assessment & Plan Note (Signed)
Ok for change percocet to 10/325 prn,  to f/u any worsening symptoms or concerns

## 2012-05-15 NOTE — Assessment & Plan Note (Signed)
Unclear etiology, not orthostati by BP on today's exam, had fairly recent echo with ok EF, for carotid dopplers

## 2012-05-15 NOTE — Assessment & Plan Note (Signed)
Mild, none on exam today, likely venous insuff, for compression stocking

## 2012-05-16 ENCOUNTER — Other Ambulatory Visit: Payer: Self-pay | Admitting: Internal Medicine

## 2012-05-27 DIAGNOSIS — R0989 Other specified symptoms and signs involving the circulatory and respiratory systems: Secondary | ICD-10-CM

## 2012-05-29 ENCOUNTER — Inpatient Hospital Stay: Admission: RE | Admit: 2012-05-29 | Payer: Medicare Other | Source: Ambulatory Visit

## 2012-06-10 ENCOUNTER — Other Ambulatory Visit: Payer: Self-pay | Admitting: Internal Medicine

## 2012-06-10 MED ORDER — ALPRAZOLAM 0.25 MG PO TABS: 0.2500 mg | ORAL_TABLET | Freq: Two times a day (BID) | ORAL | Status: DC | PRN

## 2012-06-10 NOTE — Telephone Encounter (Signed)
Faxed hardcopy to pharmacy. 

## 2012-06-10 NOTE — Telephone Encounter (Signed)
Done hardcopy to robin  

## 2012-06-19 ENCOUNTER — Telehealth: Payer: Self-pay | Admitting: Internal Medicine

## 2012-06-19 ENCOUNTER — Ambulatory Visit (INDEPENDENT_AMBULATORY_CARE_PROVIDER_SITE_OTHER)
Admission: RE | Admit: 2012-06-19 | Discharge: 2012-06-19 | Disposition: A | Discharge status: Home / Self Care | Payer: Medicare Other | Source: Ambulatory Visit | Attending: Internal Medicine | Admitting: Internal Medicine

## 2012-06-19 DIAGNOSIS — M810 Age-related osteoporosis without current pathological fracture: Secondary | ICD-10-CM

## 2012-06-19 MED ORDER — OXYCODONE-ACETAMINOPHEN 10-325 MG PO TABS: 1.0000 | ORAL_TABLET | Freq: Four times a day (QID) | ORAL | Status: DC | PRN

## 2012-06-19 NOTE — Telephone Encounter (Signed)
Called the patients daughter Sheralyn Boatman and her home as well (left message there) that hardcopy is ready for pickup at the front desk.

## 2012-06-19 NOTE — Telephone Encounter (Signed)
Done hardcopy to robin  

## 2012-06-19 NOTE — Telephone Encounter (Signed)
Pt request refill on her pain meds, pt knows it time to be filled, its just she is here today having Bone Density done and its hard for her to get back out

## 2012-06-24 ENCOUNTER — Encounter: Payer: Self-pay | Admitting: Internal Medicine

## 2012-07-08 ENCOUNTER — Other Ambulatory Visit: Payer: Self-pay | Admitting: Internal Medicine

## 2012-07-22 ENCOUNTER — Other Ambulatory Visit: Payer: Self-pay

## 2012-07-22 MED ORDER — OXYCODONE-ACETAMINOPHEN 10-325 MG PO TABS: 1.0000 | ORAL_TABLET | Freq: Four times a day (QID) | ORAL | Status: DC | PRN

## 2012-07-22 NOTE — Telephone Encounter (Signed)
Called the patient informed to pickup hardcopy at the front desk. 

## 2012-07-22 NOTE — Telephone Encounter (Signed)
Done hardcopy to robin  

## 2012-08-12 ENCOUNTER — Telehealth: Payer: Self-pay | Admitting: *Deleted

## 2012-08-12 MED ORDER — TRAZODONE HCL 50 MG PO TABS: 25.0000 mg | ORAL_TABLET | Freq: Every evening | ORAL | Status: DC | PRN

## 2012-08-12 NOTE — Telephone Encounter (Signed)
Called the daughter informed of medication change.

## 2012-08-12 NOTE — Telephone Encounter (Signed)
Pt's daughter called regarding Zolpidem rx. Rx was $50 instead of usual price of $8. Pt's daughter contacted insurance company and Zolpidem is only authorized for 90 day supply for whole year. Since pt has received limit for the year, she would need a prior authorization or alternative medication. Phone Number to contact [employer] is [phone].

## 2012-08-12 NOTE — Telephone Encounter (Signed)
Ok to try trazodone 50 qhs

## 2012-08-14 ENCOUNTER — Ambulatory Visit: Payer: Medicare Other | Admitting: Internal Medicine

## 2012-08-21 ENCOUNTER — Telehealth: Payer: Self-pay | Admitting: *Deleted

## 2012-08-21 MED ORDER — OXYCODONE-ACETAMINOPHEN 10-325 MG PO TABS: 1.0000 | ORAL_TABLET | Freq: Four times a day (QID) | ORAL | Status: DC | PRN

## 2012-08-21 NOTE — Telephone Encounter (Signed)
Pt's daughter is requesting refill of Percocet for mother. She also states that mother has to take 4 a day at times because of pain and is wondering if dosage or amount could be increased.

## 2012-08-21 NOTE — Telephone Encounter (Signed)
Done hardcopy to robin  

## 2012-08-22 NOTE — Telephone Encounter (Signed)
Patients daughter informed to pickup hardcopy at the front desk. 

## 2012-08-29 ENCOUNTER — Other Ambulatory Visit: Payer: Self-pay

## 2012-08-29 MED ORDER — GABAPENTIN 300 MG PO CAPS: 300.0000 mg | ORAL_CAPSULE | Freq: Three times a day (TID) | ORAL | Status: DC

## 2012-09-02 ENCOUNTER — Other Ambulatory Visit: Payer: Self-pay | Admitting: *Deleted

## 2012-09-02 ENCOUNTER — Telehealth: Payer: Self-pay | Admitting: *Deleted

## 2012-09-02 MED ORDER — GABAPENTIN 300 MG PO CAPS: 300.0000 mg | ORAL_CAPSULE | Freq: Three times a day (TID) | ORAL | Status: DC

## 2012-09-02 NOTE — Telephone Encounter (Signed)
Pt's daughter left a vm requesting a Rf on her meloxicam and gabapentin. I sent Gabapentin. However, the med list states Meloxicam was d/c due to change in therapy. Please f/u with MD and pt re: this Rf.

## 2012-09-03 MED ORDER — MELOXICAM 15 MG PO TABS: 15.0000 mg | ORAL_TABLET | Freq: Every day | ORAL | Status: DC

## 2012-09-03 NOTE — Telephone Encounter (Signed)
Pt without allergy, GI issue or recent renal insuff  Ok to re-start - I sent refill

## 2012-09-03 NOTE — Telephone Encounter (Signed)
Called the daughter left detailed msg. rx sent in.

## 2012-09-10 ENCOUNTER — Telehealth: Payer: Self-pay

## 2012-09-10 NOTE — Telephone Encounter (Signed)
Informed the patient of MD response to request.

## 2012-09-10 NOTE — Telephone Encounter (Signed)
Pt's daughter, Kennith Center called, she states Oxycodone is not helping her moms pain. It's getting worse. She is requesting a different medication for her mom. [facility] [address] and she's also requesting a generic. Please advise.

## 2012-09-10 NOTE — Telephone Encounter (Signed)
This is the highest strength of this, and I do not normally prescribed morphine or dilaudid or similar meds for pain;  I can refer to pain clinic if needed

## 2012-09-11 ENCOUNTER — Telehealth: Payer: Self-pay

## 2012-09-11 NOTE — Telephone Encounter (Signed)
Ok - to robin to handle 

## 2012-09-11 NOTE — Telephone Encounter (Signed)
Phone call from pt's daughter Kennith Center stating medical records need to be sent to [facility](p [phone]) for pt up coming appt right total hip on 11/06/12.

## 2012-09-12 NOTE — Telephone Encounter (Signed)
Office note faxed to [phone]

## 2012-09-24 ENCOUNTER — Other Ambulatory Visit: Payer: Self-pay

## 2012-09-24 MED ORDER — OXYCODONE-ACETAMINOPHEN 10-325 MG PO TABS: 1.0000 | ORAL_TABLET | Freq: Four times a day (QID) | ORAL | Status: DC | PRN

## 2012-09-24 NOTE — Telephone Encounter (Signed)
Patient informed to pickup hardcopy at the front desk. 

## 2012-09-24 NOTE — Telephone Encounter (Signed)
Done hardcopy to robin  

## 2012-09-25 ENCOUNTER — Telehealth: Payer: Self-pay | Admitting: Internal Medicine

## 2012-09-25 NOTE — Telephone Encounter (Signed)
Per pt daughter Kennith Center)  Pt need a written Rx for   oxyCODONE-acetaminophen (PERCOCET) 10-325 MG per tablet please call when ready for pickup  @ [phone]

## 2012-09-25 NOTE — Telephone Encounter (Signed)
Patient informed that prescription was filled and put in cabnet yesterday 09/24/12 was notified as well.

## 2012-09-25 NOTE — Telephone Encounter (Deleted)
Per pt daughter call [facility] need a  Written Rx for

## 2012-10-14 ENCOUNTER — Other Ambulatory Visit: Payer: Self-pay | Admitting: Internal Medicine

## 2012-10-18 NOTE — Progress Notes (Signed)
Surgery scheduled for 11/06/12.  Need orders in EPIC.  Thank You.  

## 2012-10-20 ENCOUNTER — Other Ambulatory Visit: Payer: Self-pay | Admitting: Internal Medicine

## 2012-10-23 ENCOUNTER — Ambulatory Visit (INDEPENDENT_AMBULATORY_CARE_PROVIDER_SITE_OTHER): Payer: Medicare Other | Admitting: Internal Medicine

## 2012-10-23 ENCOUNTER — Encounter: Payer: Self-pay | Admitting: Internal Medicine

## 2012-10-23 VITALS — BP 180/100 | HR 65 | Temp 98.4°F | Ht 64.0 in | Wt 180.0 lb

## 2012-10-23 DIAGNOSIS — M1990 Unspecified osteoarthritis, unspecified site: Secondary | ICD-10-CM

## 2012-10-23 DIAGNOSIS — J449 Chronic obstructive pulmonary disease, unspecified: Secondary | ICD-10-CM

## 2012-10-23 DIAGNOSIS — G894 Chronic pain syndrome: Secondary | ICD-10-CM

## 2012-10-23 DIAGNOSIS — Z01818 Encounter for other preprocedural examination: Secondary | ICD-10-CM

## 2012-10-23 DIAGNOSIS — R079 Chest pain, unspecified: Secondary | ICD-10-CM | POA: Insufficient documentation

## 2012-10-23 DIAGNOSIS — R1310 Dysphagia, unspecified: Secondary | ICD-10-CM | POA: Insufficient documentation

## 2012-10-23 DIAGNOSIS — K219 Gastro-esophageal reflux disease without esophagitis: Secondary | ICD-10-CM

## 2012-10-23 MED ORDER — OMEPRAZOLE 20 MG PO CPDR: 40.0000 mg | DELAYED_RELEASE_CAPSULE | Freq: Every day | ORAL | Status: DC

## 2012-10-23 MED ORDER — ALPRAZOLAM 0.25 MG PO TABS: 0.2500 mg | ORAL_TABLET | Freq: Two times a day (BID) | ORAL | Status: DC | PRN

## 2012-10-23 MED ORDER — OXYCODONE-ACETAMINOPHEN 10-325 MG PO TABS: 1.0000 | ORAL_TABLET | Freq: Four times a day (QID) | ORAL | Status: DC | PRN

## 2012-10-23 NOTE — Assessment & Plan Note (Signed)
Uncontrolled - for incr prilosec to 40 qd,  to f/u any worsening symptoms or concerns

## 2012-10-23 NOTE — Assessment & Plan Note (Signed)
stable overall by history and exam, recent data reviewed with pt, and pt to continue medical treatment as before,  to f/u any worsening symptoms or concerns SpO2 Readings from Last 3 Encounters:  10/23/12 94%  05/15/12 98%  02/28/12 97%

## 2012-10-23 NOTE — Assessment & Plan Note (Signed)
Overall stable, for pain med refill

## 2012-10-23 NOTE — Assessment & Plan Note (Addendum)
OK for right Hip surgury as planned  Note:  Total time for pt hx, exam, review of record with pt in the room, determination of diagnoses and plan for further eval and tx is > 40 min, with over 50% spent in coordination and counseling of patient

## 2012-10-23 NOTE — Assessment & Plan Note (Signed)
For right hip THA soon

## 2012-10-23 NOTE — Assessment & Plan Note (Signed)
Ideally should f/u with Dr Jarold Motto, but delcines for now due to upcoming surgury

## 2012-10-23 NOTE — Assessment & Plan Note (Signed)
ECG reviewed as per emr, by hx and exam today most likely c/w GI source, PPI increased as above, will hold on further labs, cxr as likely to be done preop and she does not want today, does not need repeat stress testing at this time

## 2012-10-23 NOTE — Patient Instructions (Signed)
Your EKG was OK today OK to increase the prilosec to 40 mg per day Please continue all other medications as before, and refills have been done as requested - xanax, and oxycodone No further evaluation or treatment is needed today  You are Cleared for Right hip surgury as planned  Please plan to see Dr Patterson/GI as soon as reasonable after your hip rehabilitation period for the trouble swallowing, or sooner if it is worse

## 2012-10-23 NOTE — Progress Notes (Signed)
Subjective:    Patient ID: Wendy Torres, female    DOB: 07/31/1940, 72 y.o.   MRN: [id number]  HPI  Here with 1 wk (or slightly more) recent worsening SCCP, mild intermittent, better with eating, ? Worse with lyging down, non exertional, nonplueritic, and not assoc with sob, n/v, diaphoresis, palp or dizziness.  Overall good compliance with treatment, and good medicine tolerability, including the PPI  prilosec 20 qd.  Has long hx of esophageal and reflux, s/p EGD with dilation 2011 last per Dr Patterson/GI.  Last stress test 2012 - no ischemia. Does also have some mild dysphagia no change in 3-6 mo, and is trying to hold off for now to see GI again for dilation as she is due for right hip THA very soon.  Denies worsening depressive symptoms, suicidal ideation, or panic; has ongoing anxiety, not increased recently.   Overall chronic pain ok, has significant breakthrough with ambulation with the right hip only now. Past Medical History  Diagnosis Date  . HYPERLIPIDEMIA 04/18/2007  . Morbid obesity 12/09/2006  . ANEMIA 01/14/2009  . Chronic pain syndrome 02/16/2009  . HYPERTENSION 12/09/2006  . ACUTE URIS OF UNSPECIFIED SITE 10/22/2007  . ALLERGIC RHINITIS 12/09/2006  . ASTHMA 12/09/2006  . COPD 12/09/2006  . Glossitis 01/14/2009  . GERD 12/09/2006  . HIATAL HERNIA 03/01/2010  . OVERACTIVE BLADDER 04/14/2008  . DEGENERATIVE JOINT DISEASE 12/09/2006  . LOW BACK PAIN 12/09/2006  . OSTEOPOROSIS 12/09/2006  . SYNCOPE 11/05/2008  . DIZZINESS 03/30/2010  . INSOMNIA-SLEEP DISORDER-UNSPEC 11/05/2008  . HYPERSOMNIA 08/31/2008  . PALPITATIONS, RECURRENT 01/27/2010  . DYSPNEA 01/27/2010  . Other dysphagia 01/14/2009  . FREQUENCY, URINARY 03/30/2010  . Abdominal pain, right upper quadrant 11/28/2007  . EPIGASTRIC PAIN 01/14/2009  . Abdominal  pain, other specified site 11/28/2007  . Nonspecific (abnormal) findings on radiological and other examination of body structure 11/05/2008  . TRANSIENT ISCHEMIC ATTACK, HX OF  12/09/2006  . HELICOBACTER PYLORI GASTRITIS, HX OF 12/09/2006  . ABNORMAL CHEST XRAY 11/05/2008  . Gait disorder 09/04/2011   Past Surgical History  Procedure Laterality Date  . Tubal ligation    . Lumbar disc surgery    . Colonoscopy      reports that she has quit smoking. Her smoking use included Cigarettes. She has a 12.5 pack-year smoking history. She does not have any smokeless tobacco history on file. She reports that she does not drink alcohol or use illicit drugs. family history includes Cancer in her father; Heart disease in her mother; and Hypertension in her brother. No Known Allergies Current Outpatient Prescriptions on File Prior to Visit  Medication Sig Dispense Refill  . ALPRAZolam (XANAX) 0.25 MG tablet Take 1 tablet (0.25 mg total) by mouth 2 (two) times daily as needed.  60 tablet  3  . amLODipine (NORVASC) 10 MG tablet Take 1 tablet (10 mg total) by mouth daily.  90 tablet  3  . aspirin EC 325 MG EC tablet Take 325 mg by mouth once daily.        . benazepril (LOTENSIN) 40 MG tablet TAKE 1 TABLET BY MOUTH EVERY DAY  30 tablet  11  . Cholecalciferol (VITAMIN D3) 1000 UNITS capsule Take 1,000 Units by mouth daily.        . cloNIDine (CATAPRES) 0.3 MG tablet Take 1 tablet (0.3 mg total) by mouth 2 (two) times daily.  180 tablet  3  . diclofenac sodium (VOLTAREN) 1 % GEL Apply 2 g topically 4 (four) times daily  as needed.  100 g  1  . ferrous sulfate 325 (65 FE) MG tablet TAKE 1 TABLET BY MOUTH EVERY DAY  30 tablet  0  . furosemide (LASIX) 20 MG tablet TAKE 1 TABLET EVERY MORNING  30 tablet  11  . gabapentin (NEURONTIN) 300 MG capsule Take 1 capsule (300 mg total) by mouth 3 (three) times daily.  90 capsule  3  . lovastatin (MEVACOR) 40 MG tablet TAKE 1 TABLET BY MOUTH EVERY DAY  90 tablet  3  . meloxicam (MOBIC) 15 MG tablet Take 1 tablet (15 mg total) by mouth daily. As needed for pain  30 tablet  5  . nystatin (MYCOSTATIN) 100000 UNIT/ML suspension USE AS DIRECTED 5 MLS IN THE  MOUTH OR THROAT 3 (THREE) TIMES DAILY AS NEEDED. - TO SWISH AND SPIT  240 mL  1  . omeprazole (PRILOSEC) 20 MG capsule TAKE 1 CAPSULE (20 MG TOTAL) BY MOUTH DAILY.  30 capsule  11  . oxybutynin (DITROPAN) 5 MG tablet 2 tabs by mouth twice per day  120 tablet  11  . oxybutynin (DITROPAN) 5 MG tablet TAKE 1 TABLET (5 MG TOTAL) BY MOUTH 3 (THREE) TIMES DAILY.  120 tablet  10  . oxyCODONE-acetaminophen (PERCOCET) 10-325 MG per tablet Take 1 tablet by mouth every 6 (six) hours as needed for pain.  120 tablet  0  . traZODone (DESYREL) 50 MG tablet Take 0.5-1 tablets (25-50 mg total) by mouth at bedtime as needed for sleep.  60 tablet  5  . zolpidem (AMBIEN) 10 MG tablet TAKE 1 TABLET AT BEDTIME AS NEEDED  30 tablet  5  . [DISCONTINUED] lansoprazole (PREVACID) 30 MG capsule Take 1 capsule (30 mg total) by mouth daily.  90 capsule  3  . [DISCONTINUED] pantoprazole (PROTONIX) 40 MG tablet 1 tab by mouth twice per day  60 tablet  11   No current facility-administered medications on file prior to visit.   Review of Systems  Constitutional: Negative for unexpected weight change, or unusual diaphoresis  HENT: Negative for tinnitus.   Eyes: Negative for photophobia and visual disturbance.  Respiratory: Negative for choking and stridor.   Gastrointestinal: Negative for vomiting and blood in stool.  Genitourinary: Negative for hematuria and decreased urine volume.  Musculoskeletal: Negative for acute joint swelling Skin: Negative for color change and wound.  Neurological: Negative for tremors and numbness other than noted  Psychiatric/Behavioral: Negative for decreased concentration or  hyperactivity.       Objective:   Physical Exam BP 180/100  Pulse 65  Temp(Src) 98.4 F (36.9 C) (Oral)  Ht 5\' 4"  (1.626 m)  Wt 180 lb (81.647 kg)  BMI 30.88 kg/m2  SpO2 94% VS noted, not ill appearing Constitutional: Pt appears well-developed and well-nourished.  HENT: Head: NCAT.  Right Ear: External ear  normal.  Left Ear: External ear normal.  Eyes: Conjunctivae and EOM are normal. Pupils are equal, round, and reactive to light.  Neck: Normal range of motion. Neck supple.  Cardiovascular: Normal rate and regular rhythm.   Pulmonary/Chest: Effort normal and breath sounds normal.  - no rales or wheezing Abd:  Soft, NT, non-distended, + BS Neurological: Pt is alert. Not confused , motor no change Skin: Skin is warm. No erythema. Has trace edema LE's, mult varicosities Psychiatric: Pt behavior is normal. Thought content normal.     Assessment & Plan:

## 2012-10-24 NOTE — Progress Notes (Signed)
Surgery scheduled for 11/06/12.  Need orders in EPIC.  Thank You.

## 2012-10-25 NOTE — Progress Notes (Signed)
PT coming for preop TUES 10-29-12 - need orders when possible please - thank you

## 2012-10-27 ENCOUNTER — Other Ambulatory Visit: Payer: Self-pay | Admitting: Orthopedic Surgery

## 2012-10-27 MED ORDER — DEXAMETHASONE SODIUM PHOSPHATE 10 MG/ML IJ SOLN: 10.0000 mg | Freq: Once | INTRAMUSCULAR | Status: DC

## 2012-10-27 MED ORDER — BUPIVACAINE LIPOSOME 1.3 % IJ SUSP: 20.0000 mL | Freq: Once | INTRAMUSCULAR | Status: DC

## 2012-10-27 NOTE — Progress Notes (Signed)
Preoperative surgical orders have been place into the Epic hospital system for [facility] on 10/27/2012, 4:21 PM  by Patrica Duel for surgery on 11/06/2012.  Preop Total Hip orders including Experel Injecion, PO Tylenol, and IV Decadron as long as there are no contraindications to the above medications. Avel Peace, PA-C

## 2012-10-28 ENCOUNTER — Other Ambulatory Visit (HOSPITAL_COMMUNITY): Payer: Self-pay | Admitting: *Deleted

## 2012-10-28 ENCOUNTER — Encounter (HOSPITAL_COMMUNITY): Payer: Self-pay | Admitting: Pharmacy Technician

## 2012-10-29 ENCOUNTER — Encounter (HOSPITAL_COMMUNITY): Payer: Self-pay

## 2012-10-29 ENCOUNTER — Ambulatory Visit (HOSPITAL_COMMUNITY)
Admission: RE | Admit: 2012-10-29 | Discharge: 2012-10-29 | Disposition: A | Discharge status: Home / Self Care | Payer: Medicare Other | Source: Ambulatory Visit | Attending: Orthopedic Surgery | Admitting: Orthopedic Surgery

## 2012-10-29 ENCOUNTER — Encounter (HOSPITAL_COMMUNITY)
Admission: RE | Admit: 2012-10-29 | Discharge: 2012-10-29 | Disposition: A | Discharge status: Home / Self Care | Payer: Medicare Other | Source: Ambulatory Visit | Attending: Orthopedic Surgery | Admitting: Orthopedic Surgery

## 2012-10-29 DIAGNOSIS — I77810 Thoracic aortic ectasia: Secondary | ICD-10-CM | POA: Insufficient documentation

## 2012-10-29 DIAGNOSIS — M169 Osteoarthritis of hip, unspecified: Secondary | ICD-10-CM | POA: Insufficient documentation

## 2012-10-29 DIAGNOSIS — I10 Essential (primary) hypertension: Secondary | ICD-10-CM | POA: Insufficient documentation

## 2012-10-29 DIAGNOSIS — Z01818 Encounter for other preprocedural examination: Secondary | ICD-10-CM | POA: Insufficient documentation

## 2012-10-29 DIAGNOSIS — M1610 Unilateral primary osteoarthritis, unspecified hip: Secondary | ICD-10-CM | POA: Insufficient documentation

## 2012-10-29 LAB — COMPREHENSIVE METABOLIC PANEL
AST: 17 U/L (ref 0–37)
Albumin: 3.8 g/dL (ref 3.5–5.2)
BUN: 11 mg/dL (ref 6–23)
Calcium: 10.3 mg/dL (ref 8.4–10.5)
Chloride: 102 mEq/L (ref 96–112)
Creatinine, Ser: 0.78 mg/dL (ref 0.50–1.10)
Total Bilirubin: 0.1 mg/dL — ABNORMAL LOW (ref 0.3–1.2)

## 2012-10-29 LAB — CBC
HCT: 36.5 % (ref 36.0–46.0)
MCH: 26.8 pg (ref 26.0–34.0)
MCHC: 32.3 g/dL (ref 30.0–36.0)
MCV: 83 fL (ref 78.0–100.0)
Platelets: 331 10*3/uL (ref 150–400)
RDW: 13.3 % (ref 11.5–15.5)
WBC: 6.5 10*3/uL (ref 4.0–10.5)

## 2012-10-29 LAB — PROTIME-INR
INR: 0.94 (ref 0.00–1.49)
Prothrombin Time: 12.4 seconds (ref 11.6–15.2)

## 2012-10-29 LAB — URINALYSIS, ROUTINE W REFLEX MICROSCOPIC
Bilirubin Urine: NEGATIVE
Glucose, UA: NEGATIVE mg/dL
Ketones, ur: NEGATIVE mg/dL
Nitrite: NEGATIVE
Protein, ur: NEGATIVE mg/dL
pH: 6.5 (ref 5.0–8.0)

## 2012-10-29 LAB — SURGICAL PCR SCREEN: MRSA, PCR: POSITIVE — AB

## 2012-10-29 NOTE — Patient Instructions (Signed)
20      Your procedure is scheduled on:  Wednesday 11/06/2012  Report to [facility] at 1100  AM.  Call this number if you have problems the morning of surgery: [phone]   Remember:             IF YOU USE CPAP,BRING MASK AND TUBING AM OF SURGERY!   Do not eat food  AFTER MIDNIGHT! MAY HAVE CLEAR LIQUIDS FROM MIDNIGHT UP UNTIL 0730 AM THE MORNING OF SURGERY AND THEN NOTHING UNTIL AFTER SURGERY!  Take these medicines the morning of surgery with A SIP OF WATER: Amlodipine, Gabapentin, Clonidine, use Systane eye drops   Do not bring valuables to the hospital. [facility] IS NOT RESPONSIBLE  FOR ANY BELONGINGS OR VALUABLES.  Wynelle Fanny suitcase in the car. After surgery it may be brought to your room.  For patients admitted to the hospital, checkout time is 11:00 AM the day of              Discharge.    DO NOT WEAR JEWELRY , MAKE-UP, LOTIONS,POWDERS,PERFUMES!             WOMEN -DO NOT SHAVE LEGS OR UNDERARMS 12 HRS. BEFORE  SURGERY!               MEN MAY SHAVE AS USUAL!             CONTACTS,DENTURES OR BRIDGEWORK, FALSE EYELASHES MAY NOT BE WORN INTO SURGERY!                                           Patients discharged the day of surgery will not be allowed to drive home. If going home the same day of surgery, must have someone stay with you first 24 hrs.at home and arrange for someone to drive you home from the              Hospital.              YOUR DRIVER ZO:XWRUEA Williams-daughter   Special Instructions:             Please read over the following fact sheets that you were given:             1. [facility] PREPARING FOR SURGERY SHEET              2.[facility] INFORMATION              3.[facility]                                        Telford Nab.Nicolo Tomko,RN,BSN     [phone]                FAILURE TO FOLLOW THESE INSTRUCTIONS MAY RESULT IN  CANCELLATION OF YOUR SURGERY!               Patient Signature:___________________________

## 2012-11-05 ENCOUNTER — Other Ambulatory Visit: Payer: Self-pay | Admitting: Orthopedic Surgery

## 2012-11-05 NOTE — H&P (Signed)
Wendy Torres  DOB: 11/05/1940 Married / Language: English / Race: Black or African American Female  Date of Admission:  11/06/2012  Chief Complaint:  Right Hip Pain  History of Present Illness The patient is a 72 year old female who comes in for a preoperative History and Physical. The patient is scheduled for a right total hip arthroplasty (acetabular autografting) to be performed by Dr. Frank V. Aluisio, MD at [facility] on 11/06/2012. The patient is a 72 year old female who presents with knee complaints. The patient reports right knee symptoms including: pain, swelling and stiffness which began 1 year(s) ago without any known injury. The patient describes the severity of the symptoms as moderate in severity.The patient feels that the symptoms are worsening. Symptoms are exacerbated by weight bearing and walking. She has been having pain in both knees, right greater than left, for several years but it has been worse over the last year. She has started walking with a walker about 6 months ago. She has never had injections or surgery on the knees. She does occasionally have some pain in the groin but much worse in the knees. She has been taking oxycodone from Dr.John for her knee. The right knee hurts more than the left. The pain is also occurring up in the groin. Anytime she tries to flex her leg up she will get a shooting pain down into the knee. Over the past few months this has gotten progressively worse. She is now requiring a walker to ambulate. The pain does occur, some at rest. This is far worse with activity. This also occurs at night. It is hard to get a comfortable position. The left side hurts a little bit, no where near as bad as the right. She feels her gait has become progressively more unsteady also.She has not had any injections in her knee. She is not currently having any significant low back pain. X-rays of both knees show that she does have some joint space  narrowing medial and lateral. No bone on bone changes. AP of the pelvis and lateral of her right hip shows she has a severe end stage arthritis of both hips, worse on the right than the left. She has a severe protrusio deformity on the right with bone on bone changes and with some small erosion of the femoral head. On the left hip she also has protrusio not quite as bad as the right.  The problem is coming from her hip. She has a horrible arthritic right hip. The left hip is arthritic also but not quite as bad. She has a severe protrusio on the right side. At this point, the only thing that is predictably going to get her better would be a total hip arthroplasty. She is not a candidate for an anterior approach given the severe protrusio. We will do a posterior approach. She will need an acetabular autografting. We did discuss all of this in detail. She would like to have this done as soon as possible. She is accompanied by her daughter today. We will call and get her set up for surgical treatment. They have been treated conservatively in the past for the above stated problem and despite conservative measures, they continue to have progressive pain and severe functional limitations and dysfunction. They have failed non-operative management including home exercise, medications. It is felt that they would benefit from undergoing total joint replacement. Risks and benefits of the procedure have been discussed with the patient and they   elect to proceed with surgery. There are no active contraindications to surgery such as ongoing infection or rapidly progressive neurological disease.   Problem List Osteoarthritis, Hip (715.35)  Allergies No Known Drug Allergies   Family History Cancer. brother   Social History Living situation. live with spouse Marital status. married Number of flights of stairs before winded. 4-5 Drug/Alcohol Rehab (Previously). no Exercise. Exercises rarely; does  other Illicit drug use. no Pain Contract. no Tobacco / smoke exposure. no Tobacco use. former smoker; smoke(d) 1 pack(s) per day; uses less than half 1/2 can(s) smokeless per week Current work status. unemployed Drug/Alcohol Rehab (Currently). no Children. 5 or more Alcohol use. former drinker   Medication History Omeprazole (20MG Capsule DR, Oral) Active. CloNIDine HCl (0.3MG Tablet, Oral) Active. Oxybutynin Chloride (5MG Tablet, Oral) Active. Oxycodone-Acetaminophen (10-325MG Tablet, Oral) Active. ALPRAZolam (0.25MG Tablet, Oral) Active. Gabapentin (300MG Capsule, Oral) Active. Furosemide (20MG Tablet, Oral) Active. Multivitamin ( Oral) Active. Aspirin EC (325MG Tablet DR, Oral) Active.   Past Surgical History Spinal Surgery Cesarean Section - 1. Date: 2002.   Medical History Gastroesophageal Reflux Disease High blood pressure Chronic Pain Hypercholesterolemia Rheumatoid Arthritis Herniated Discs L3-L4-L5 History of CVA approx. 5 years ago Cataract Past History of Bronchitis Eczema   Review of Systems General:Not Present- Chills, Fever, Night Sweats, Fatigue, Weight Gain, Weight Loss and Memory Loss. Skin:Not Present- Hives, Itching, Rash, Eczema and Lesions. HEENT:Present- Blurred Vision. Not Present- Tinnitus, Headache, Double Vision, Visual Loss, Hearing Loss and Dentures. Respiratory:Not Present- Shortness of breath with exertion, Shortness of breath at rest, Allergies, Coughing up blood and Chronic Cough. Cardiovascular:Present- Swelling. Not Present- Chest Pain, Racing/skipping heartbeats, Difficulty Breathing Lying Down, Murmur and Palpitations. Gastrointestinal:Present- Heartburn and Constipation. Not Present- Bloody Stool, Abdominal Pain, Vomiting, Nausea, Diarrhea, Difficulty Swallowing, Jaundice and Loss of appetitie. Female Genitourinary:Present- Urinating at Night. Not Present- Blood in Urine, Urinary frequency, Weak urinary stream,  Discharge, Flank Pain, Incontinence, Painful Urination, Urgency and Urinary Retention. Musculoskeletal:Present- Joint Pain, Back Pain and Morning Stiffness. Not Present- Muscle Weakness, Muscle Pain, Joint Swelling and Spasms. Neurological:Not Present- Tremor, Dizziness, Blackout spells, Paralysis, Difficulty with balance and Weakness. Psychiatric:Not Present- Insomnia.   Vitals  Weight: 173 lb Height: 63 in Weight was reported by patient. Height was reported by patient. Body Surface Area: 1.87 m Body Mass Index: 30.65 kg/m Pulse: 68 (Regular) Resp.: 14 (Unlabored) BP: 158/78 (Sitting, Right Arm, Standard)    Physical Exam  The physical exam findings are as follows:  Note: Patient is a 71 year old female with continued hip pain. Patient is accompanied today by her daughters Tonie and Brenda.   General Mental Status - Alert, cooperative and good historian. General Appearance- pleasant. Not in acute distress. Orientation- Oriented X3. Build & Nutrition- Well nourished and Well developed.   Head and Neck Head- normocephalic, atraumatic . Neck Global Assessment- supple. no bruit auscultated on the right and no bruit auscultated on the left.   Eye Pupil- Bilateral- Regular and Round. Motion- Bilateral- EOMI.   Chest and Lung Exam Auscultation: Breath sounds:- clear at anterior chest wall and - clear at posterior chest wall. Adventitious sounds:- No Adventitious sounds.   Cardiovascular Auscultation:Rhythm- Regular rate and rhythm. Heart Sounds- S1 WNL and S2 WNL. Murmurs & Other Heart Sounds:Auscultation of the heart reveals - No Murmurs.   Abdomen Palpation/Percussion:Tenderness- Abdomen is non-tender to palpation. RigidTity (guarding)- Abdomen is soft. Auscultation:Auscultation of the abdomen reveals - Bowel sounds normal.   Female Genitourinary  Not done, not pertinent to present illness    Musculoskeletal She is a  well developed female. She is alert and oriented. No apparent distress. Evaluation of her left hip, flexion to 110, minimal internal rotation to about 10 external rotation and 30 abduction. The right hip flexion is to about 90. No internal or external rotation. There is about 10 degrees of abduction. When I flex and rotate the right hip it will cause a sharp pain in her knee which is the pain she is generally experiencing in the knee. Evaluation of both knees shows no effusion in either knee. Range of motion is about 5-125 on each side. There is crepitus on range of motion. There is no specific joint line tenderness and no instability about the knees.  RADIOGRAPHS: AP and lateral of both knees show that she does have some joint space narrowing medial and lateral. No bone on bone changes. AP of the pelvis and lateral of her right hip shows she has a severe end stage arthritis of both hips, worse on the right than the left. She has a severe protrusio deformity on the right with bone on bone changes and with some small erosion of the femoral head. On the left hip she also has protrusio not quite as bad as the right. There is some degenerative change in the head also.  Assessment & Plan Osteoarthritis, Hip (715.35) Impression: Right Hip  Note: Plan is for a Right Total Hip Replacement with Acetabular Autografting by Dr. Aluisio.  Plan is to go to Rehab following surgery.  The patient will NOT receive TXA (tranexamic acid) due to: History of Stroke  Signed electronically by Alexzandrew L Perkins, III PA-C 

## 2012-11-06 ENCOUNTER — Encounter (HOSPITAL_COMMUNITY): Payer: Self-pay | Admitting: *Deleted

## 2012-11-06 ENCOUNTER — Inpatient Hospital Stay (HOSPITAL_COMMUNITY): Payer: Medicare Other | Admitting: Anesthesiology

## 2012-11-06 ENCOUNTER — Inpatient Hospital Stay (HOSPITAL_COMMUNITY): Payer: Medicare Other

## 2012-11-06 ENCOUNTER — Inpatient Hospital Stay (HOSPITAL_COMMUNITY)
Admission: RE | Admit: 2012-11-06 | Discharge: 2012-11-09 | DRG: 470 | Disposition: A | Discharge status: Skilled Nursing Facility | Payer: Medicare Other | Source: Ambulatory Visit | Attending: Orthopedic Surgery | Admitting: Orthopedic Surgery

## 2012-11-06 ENCOUNTER — Encounter (HOSPITAL_COMMUNITY): Payer: Self-pay | Admitting: Anesthesiology

## 2012-11-06 ENCOUNTER — Encounter (HOSPITAL_COMMUNITY)
Admission: RE | Disposition: A | Discharge status: Skilled Nursing Facility | Payer: Self-pay | Source: Ambulatory Visit | Attending: Orthopedic Surgery

## 2012-11-06 DIAGNOSIS — K219 Gastro-esophageal reflux disease without esophagitis: Secondary | ICD-10-CM | POA: Diagnosis present

## 2012-11-06 DIAGNOSIS — J4489 Other specified chronic obstructive pulmonary disease: Secondary | ICD-10-CM | POA: Diagnosis present

## 2012-11-06 DIAGNOSIS — M169 Osteoarthritis of hip, unspecified: Secondary | ICD-10-CM | POA: Diagnosis present

## 2012-11-06 DIAGNOSIS — E876 Hypokalemia: Secondary | ICD-10-CM | POA: Diagnosis not present

## 2012-11-06 DIAGNOSIS — Z96649 Presence of unspecified artificial hip joint: Secondary | ICD-10-CM

## 2012-11-06 DIAGNOSIS — I10 Essential (primary) hypertension: Secondary | ICD-10-CM | POA: Diagnosis present

## 2012-11-06 DIAGNOSIS — M1610 Unilateral primary osteoarthritis, unspecified hip: Principal | ICD-10-CM | POA: Diagnosis present

## 2012-11-06 DIAGNOSIS — M247 Protrusio acetabuli: Secondary | ICD-10-CM | POA: Diagnosis present

## 2012-11-06 DIAGNOSIS — J449 Chronic obstructive pulmonary disease, unspecified: Secondary | ICD-10-CM | POA: Diagnosis present

## 2012-11-06 HISTORY — PX: TOTAL HIP ARTHROPLASTY: SHX124

## 2012-11-06 LAB — TYPE AND SCREEN: ABO/RH(D): O POS

## 2012-11-06 SURGERY — ARTHROPLASTY, HIP, TOTAL,POSTERIOR APPROACH
Anesthesia: Spinal | Site: Hip | Laterality: Right | Wound class: Clean

## 2012-11-06 MED ORDER — PROMETHAZINE HCL 25 MG/ML IJ SOLN: 6.2500 mg | INTRAMUSCULAR | Status: DC | PRN

## 2012-11-06 MED ORDER — HYDROMORPHONE HCL PF 1 MG/ML IJ SOLN: 0.2500 mg | INTRAMUSCULAR | Status: DC | PRN

## 2012-11-06 MED ORDER — PROPOFOL 10 MG/ML IV BOLUS
INTRAVENOUS | Status: DC | PRN
  Administered 2012-11-06: 20 mg via INTRAVENOUS

## 2012-11-06 MED ORDER — LACTATED RINGERS IV SOLN
INTRAVENOUS | Status: DC
  Administered 2012-11-06: 1000 mL via INTRAVENOUS
  Administered 2012-11-06: 14:00:00 via INTRAVENOUS

## 2012-11-06 MED ORDER — ALPRAZOLAM 0.25 MG PO TABS
0.2500 mg | ORAL_TABLET | Freq: Two times a day (BID) | ORAL | Status: DC | PRN
  Administered 2012-11-06: 0.25 mg via ORAL
  Filled 2012-11-06: qty 1

## 2012-11-06 MED ORDER — RIVAROXABAN 10 MG PO TABS
10.0000 mg | ORAL_TABLET | Freq: Every day | ORAL | Status: DC
  Administered 2012-11-07 – 2012-11-09 (×3): 10 mg via ORAL
  Filled 2012-11-06 (×4): qty 1

## 2012-11-06 MED ORDER — ACETAMINOPHEN 500 MG PO TABS
1000.0000 mg | ORAL_TABLET | Freq: Once | ORAL | Status: AC
  Administered 2012-11-06: 1000 mg via ORAL
  Filled 2012-11-06: qty 2

## 2012-11-06 MED ORDER — METOCLOPRAMIDE HCL 5 MG/ML IJ SOLN: 5.0000 mg | Freq: Three times a day (TID) | INTRAMUSCULAR | Status: DC | PRN

## 2012-11-06 MED ORDER — OXYCODONE HCL 5 MG/5ML PO SOLN
5.0000 mg | Freq: Once | ORAL | Status: DC | PRN
  Filled 2012-11-06: qty 5

## 2012-11-06 MED ORDER — SODIUM CHLORIDE 0.9 % IJ SOLN
INTRAMUSCULAR | Status: DC | PRN
  Administered 2012-11-06: 15:00:00

## 2012-11-06 MED ORDER — GABAPENTIN 300 MG PO CAPS
300.0000 mg | ORAL_CAPSULE | Freq: Three times a day (TID) | ORAL | Status: DC
  Administered 2012-11-06 – 2012-11-09 (×8): 300 mg via ORAL
  Filled 2012-11-06 (×12): qty 1

## 2012-11-06 MED ORDER — ONDANSETRON HCL 4 MG/2ML IJ SOLN: 4.0000 mg | Freq: Four times a day (QID) | INTRAMUSCULAR | Status: DC | PRN

## 2012-11-06 MED ORDER — KCL IN DEXTROSE-NACL 20-5-0.9 MEQ/L-%-% IV SOLN
INTRAVENOUS | Status: DC
  Administered 2012-11-07: via INTRAVENOUS
  Filled 2012-11-06 (×3): qty 1000

## 2012-11-06 MED ORDER — 0.9 % SODIUM CHLORIDE (POUR BTL) OPTIME
TOPICAL | Status: DC | PRN
  Administered 2012-11-06: 1000 mL

## 2012-11-06 MED ORDER — NYSTATIN 100000 UNIT/ML MT SUSP
500000.0000 [IU] | Freq: Three times a day (TID) | OROMUCOSAL | Status: DC
  Administered 2012-11-06 – 2012-11-09 (×8): 500000 [IU] via ORAL
  Filled 2012-11-06 (×10): qty 5

## 2012-11-06 MED ORDER — CLONIDINE HCL 0.3 MG PO TABS
0.3000 mg | ORAL_TABLET | Freq: Two times a day (BID) | ORAL | Status: DC
  Administered 2012-11-06 – 2012-11-09 (×6): 0.3 mg via ORAL
  Filled 2012-11-06 (×8): qty 1

## 2012-11-06 MED ORDER — AMLODIPINE BESYLATE 10 MG PO TABS
10.0000 mg | ORAL_TABLET | Freq: Every morning | ORAL | Status: DC
  Administered 2012-11-07 – 2012-11-09 (×3): 10 mg via ORAL
  Filled 2012-11-06 (×3): qty 1

## 2012-11-06 MED ORDER — PHENOL 1.4 % MT LIQD
1.0000 | OROMUCOSAL | Status: DC | PRN
  Filled 2012-11-06: qty 177

## 2012-11-06 MED ORDER — BUPIVACAINE HCL (PF) 0.5 % IJ SOLN
INTRAMUSCULAR | Status: DC | PRN
  Administered 2012-11-06: 3 mL

## 2012-11-06 MED ORDER — PROPOFOL INFUSION 10 MG/ML OPTIME
INTRAVENOUS | Status: DC | PRN
  Administered 2012-11-06: 50 ug/kg/min via INTRAVENOUS

## 2012-11-06 MED ORDER — ACETAMINOPHEN 325 MG PO TABS
650.0000 mg | ORAL_TABLET | Freq: Four times a day (QID) | ORAL | Status: AC
  Administered 2012-11-07 (×4): 650 mg via ORAL
  Filled 2012-11-06 (×4): qty 2

## 2012-11-06 MED ORDER — BUPIVACAINE LIPOSOME 1.3 % IJ SUSP
20.0000 mL | Freq: Once | INTRAMUSCULAR | Status: DC
  Filled 2012-11-06: qty 20

## 2012-11-06 MED ORDER — METOCLOPRAMIDE HCL 5 MG PO TABS
5.0000 mg | ORAL_TABLET | Freq: Three times a day (TID) | ORAL | Status: DC | PRN
  Filled 2012-11-06: qty 2

## 2012-11-06 MED ORDER — FLEET ENEMA 7-19 GM/118ML RE ENEM: 1.0000 | ENEMA | Freq: Once | RECTAL | Status: AC | PRN

## 2012-11-06 MED ORDER — OXYBUTYNIN CHLORIDE 5 MG PO TABS
5.0000 mg | ORAL_TABLET | Freq: Three times a day (TID) | ORAL | Status: DC
  Administered 2012-11-06 – 2012-11-09 (×8): 5 mg via ORAL
  Filled 2012-11-06 (×10): qty 1

## 2012-11-06 MED ORDER — METHOCARBAMOL 100 MG/ML IJ SOLN
500.0000 mg | Freq: Four times a day (QID) | INTRAMUSCULAR | Status: DC | PRN
  Administered 2012-11-07: 500 mg via INTRAVENOUS
  Filled 2012-11-06: qty 5

## 2012-11-06 MED ORDER — TRAZODONE HCL 50 MG PO TABS
25.0000 mg | ORAL_TABLET | Freq: Every day | ORAL | Status: DC
  Administered 2012-11-06 – 2012-11-07 (×2): 50 mg via ORAL
  Filled 2012-11-06 (×4): qty 1

## 2012-11-06 MED ORDER — MORPHINE SULFATE 2 MG/ML IJ SOLN
1.0000 mg | INTRAMUSCULAR | Status: DC | PRN
  Administered 2012-11-06 – 2012-11-07 (×2): 1 mg via INTRAVENOUS
  Filled 2012-11-06 (×2): qty 1

## 2012-11-06 MED ORDER — DEXAMETHASONE 6 MG PO TABS
10.0000 mg | ORAL_TABLET | Freq: Every day | ORAL | Status: AC
  Administered 2012-11-07: 10 mg via ORAL
  Filled 2012-11-06: qty 1

## 2012-11-06 MED ORDER — FERROUS SULFATE 325 (65 FE) MG PO TABS
325.0000 mg | ORAL_TABLET | Freq: Every day | ORAL | Status: DC
  Administered 2012-11-07 – 2012-11-09 (×3): 325 mg via ORAL
  Filled 2012-11-06 (×4): qty 1

## 2012-11-06 MED ORDER — VANCOMYCIN HCL IN DEXTROSE 1-5 GM/200ML-% IV SOLN
1000.0000 mg | Freq: Once | INTRAVENOUS | Status: AC
  Administered 2012-11-06: 1000 mg via INTRAVENOUS

## 2012-11-06 MED ORDER — EPHEDRINE SULFATE 50 MG/ML IJ SOLN
INTRAMUSCULAR | Status: DC | PRN
  Administered 2012-11-06 (×4): 5 mg via INTRAVENOUS

## 2012-11-06 MED ORDER — VANCOMYCIN HCL IN DEXTROSE 1-5 GM/200ML-% IV SOLN
1000.0000 mg | Freq: Two times a day (BID) | INTRAVENOUS | Status: AC
  Administered 2012-11-07: 1000 mg via INTRAVENOUS
  Filled 2012-11-06: qty 200

## 2012-11-06 MED ORDER — DIPHENHYDRAMINE HCL 12.5 MG/5ML PO ELIX: 12.5000 mg | ORAL_SOLUTION | ORAL | Status: DC | PRN

## 2012-11-06 MED ORDER — TRAMADOL HCL 50 MG PO TABS
50.0000 mg | ORAL_TABLET | Freq: Four times a day (QID) | ORAL | Status: DC | PRN
  Filled 2012-11-06: qty 2

## 2012-11-06 MED ORDER — SODIUM CHLORIDE 0.9 % IV SOLN: INTRAVENOUS | Status: DC

## 2012-11-06 MED ORDER — SIMVASTATIN 20 MG PO TABS
20.0000 mg | ORAL_TABLET | Freq: Every day | ORAL | Status: DC
  Administered 2012-11-06 – 2012-11-08 (×3): 20 mg via ORAL
  Filled 2012-11-06 (×4): qty 1

## 2012-11-06 MED ORDER — BISACODYL 10 MG RE SUPP: 10.0000 mg | Freq: Every day | RECTAL | Status: DC | PRN

## 2012-11-06 MED ORDER — ACETAMINOPHEN 650 MG RE SUPP
650.0000 mg | Freq: Four times a day (QID) | RECTAL | Status: AC
  Filled 2012-11-06 (×2): qty 1

## 2012-11-06 MED ORDER — PROPYLENE GLYCOL 0.6 % OP SOLN: 1.0000 [drp] | Freq: Three times a day (TID) | OPHTHALMIC | Status: DC

## 2012-11-06 MED ORDER — MORPHINE SULFATE 10 MG/ML IJ SOLN: 1.0000 mg | INTRAMUSCULAR | Status: DC | PRN

## 2012-11-06 MED ORDER — OXYCODONE HCL 5 MG PO TABS
5.0000 mg | ORAL_TABLET | ORAL | Status: DC | PRN
  Administered 2012-11-06 – 2012-11-07 (×4): 10 mg via ORAL
  Administered 2012-11-08: 5 mg via ORAL
  Administered 2012-11-08 – 2012-11-09 (×4): 10 mg via ORAL
  Filled 2012-11-06: qty 1
  Filled 2012-11-06 (×2): qty 2
  Filled 2012-11-06: qty 1
  Filled 2012-11-06 (×2): qty 2
  Filled 2012-11-06: qty 1
  Filled 2012-11-06 (×3): qty 2

## 2012-11-06 MED ORDER — ONDANSETRON HCL 4 MG PO TABS: 4.0000 mg | ORAL_TABLET | Freq: Four times a day (QID) | ORAL | Status: DC | PRN

## 2012-11-06 MED ORDER — POLYETHYLENE GLYCOL 3350 17 G PO PACK: 17.0000 g | PACK | Freq: Every day | ORAL | Status: DC | PRN

## 2012-11-06 MED ORDER — MENTHOL 3 MG MT LOZG
1.0000 | LOZENGE | OROMUCOSAL | Status: DC | PRN
  Filled 2012-11-06: qty 9

## 2012-11-06 MED ORDER — ACETAMINOPHEN 10 MG/ML IV SOLN
1000.0000 mg | Freq: Once | INTRAVENOUS | Status: DC | PRN
  Filled 2012-11-06: qty 100

## 2012-11-06 MED ORDER — METHOCARBAMOL 500 MG PO TABS
500.0000 mg | ORAL_TABLET | Freq: Four times a day (QID) | ORAL | Status: DC | PRN
  Administered 2012-11-06 – 2012-11-08 (×5): 500 mg via ORAL
  Filled 2012-11-06 (×6): qty 1

## 2012-11-06 MED ORDER — FUROSEMIDE 20 MG PO TABS
20.0000 mg | ORAL_TABLET | Freq: Every morning | ORAL | Status: DC
  Administered 2012-11-07 – 2012-11-09 (×3): 20 mg via ORAL
  Filled 2012-11-06 (×3): qty 1

## 2012-11-06 MED ORDER — OXYCODONE HCL 5 MG PO TABS: 5.0000 mg | ORAL_TABLET | Freq: Once | ORAL | Status: DC | PRN

## 2012-11-06 MED ORDER — BUPIVACAINE HCL 0.25 % IJ SOLN
INTRAMUSCULAR | Status: DC | PRN
  Administered 2012-11-06: 20 mL

## 2012-11-06 MED ORDER — DOCUSATE SODIUM 100 MG PO CAPS
100.0000 mg | ORAL_CAPSULE | Freq: Two times a day (BID) | ORAL | Status: DC
  Administered 2012-11-06 – 2012-11-09 (×6): 100 mg via ORAL

## 2012-11-06 MED ORDER — SODIUM CHLORIDE 0.9 % IR SOLN
Status: DC | PRN
  Administered 2012-11-06: 1000 mL

## 2012-11-06 MED ORDER — MEPERIDINE HCL 50 MG/ML IJ SOLN: 6.2500 mg | INTRAMUSCULAR | Status: DC | PRN

## 2012-11-06 MED ORDER — CELECOXIB 200 MG PO CAPS
200.0000 mg | ORAL_CAPSULE | Freq: Once | ORAL | Status: AC
  Administered 2012-11-06: 200 mg via ORAL
  Filled 2012-11-06: qty 1

## 2012-11-06 MED ORDER — KETAMINE HCL 50 MG/ML IJ SOLN
INTRAMUSCULAR | Status: DC | PRN
  Administered 2012-11-06: 10 mg via INTRAMUSCULAR
  Administered 2012-11-06: 5 mg via INTRAMUSCULAR
  Administered 2012-11-06: 10 mg via INTRAMUSCULAR

## 2012-11-06 MED ORDER — POLYVINYL ALCOHOL 1.4 % OP SOLN
1.0000 [drp] | Freq: Three times a day (TID) | OPHTHALMIC | Status: DC
  Administered 2012-11-06 – 2012-11-09 (×8): 1 [drp] via OPHTHALMIC
  Filled 2012-11-06: qty 15

## 2012-11-06 MED ORDER — CEFAZOLIN SODIUM-DEXTROSE 2-3 GM-% IV SOLR
2.0000 g | INTRAVENOUS | Status: AC
  Administered 2012-11-06: 2 g via INTRAVENOUS

## 2012-11-06 MED ORDER — DEXAMETHASONE SODIUM PHOSPHATE 10 MG/ML IJ SOLN
10.0000 mg | Freq: Every day | INTRAMUSCULAR | Status: AC
  Filled 2012-11-06: qty 1

## 2012-11-06 SURGICAL SUPPLY — 54 items
BAG SPEC THK2 15X12 ZIP CLS (MISCELLANEOUS) ×1
BAG ZIPLOCK 12X15 (MISCELLANEOUS) ×2 IMPLANT
BIT DRILL 2.8X128 (BIT) ×2 IMPLANT
BLADE EXTENDED COATED 6.5IN (ELECTRODE) ×2 IMPLANT
BLADE SAW SAG 73X25 THK (BLADE) ×1
BLADE SAW SGTL 73X25 THK (BLADE) ×1 IMPLANT
BONE CEMENT GENTAMICIN (Cement) ×4 IMPLANT
CAPT HIP MOP CEMENTED ×1 IMPLANT
CEMENT BONE GENTAMICIN 40 (Cement) IMPLANT
CLOTH BEACON ORANGE TIMEOUT ST (SAFETY) ×2 IMPLANT
DECANTER SPIKE VIAL GLASS SM (MISCELLANEOUS) ×2 IMPLANT
DRAPE INCISE IOBAN 66X45 STRL (DRAPES) ×2 IMPLANT
DRAPE ORTHO SPLIT 77X108 STRL (DRAPES) ×4
DRAPE POUCH INSTRU U-SHP 10X18 (DRAPES) ×2 IMPLANT
DRAPE SURG ORHT 6 SPLT 77X108 (DRAPES) ×2 IMPLANT
DRAPE U-SHAPE 47X51 STRL (DRAPES) ×2 IMPLANT
DRSG ADAPTIC 3X8 NADH LF (GAUZE/BANDAGES/DRESSINGS) ×2 IMPLANT
DRSG MEPILEX BORDER 4X4 (GAUZE/BANDAGES/DRESSINGS) ×2 IMPLANT
DRSG MEPILEX BORDER 4X8 (GAUZE/BANDAGES/DRESSINGS) ×2 IMPLANT
DURAPREP 26ML APPLICATOR (WOUND CARE) ×2 IMPLANT
ELECT REM PT RETURN 9FT ADLT (ELECTROSURGICAL) ×2
ELECTRODE REM PT RTRN 9FT ADLT (ELECTROSURGICAL) ×1 IMPLANT
EVACUATOR 1/8 PVC DRAIN (DRAIN) ×2 IMPLANT
FACESHIELD LNG OPTICON STERILE (SAFETY) ×8 IMPLANT
GLOVE BIO SURGEON STRL SZ7.5 (GLOVE) ×2 IMPLANT
GLOVE BIO SURGEON STRL SZ8 (GLOVE) ×2 IMPLANT
GLOVE BIOGEL PI IND STRL 8 (GLOVE) ×2 IMPLANT
GLOVE BIOGEL PI INDICATOR 8 (GLOVE) ×2
GOWN STRL NON-REIN LRG LVL3 (GOWN DISPOSABLE) ×4 IMPLANT
GOWN STRL REIN XL XLG (GOWN DISPOSABLE) ×3 IMPLANT
IMMOBILIZER KNEE 20 (SOFTGOODS) ×4
IMMOBILIZER KNEE 20 THIGH 36 (SOFTGOODS) IMPLANT
KIT BASIN OR (CUSTOM PROCEDURE TRAY) ×2 IMPLANT
MANIFOLD NEPTUNE II (INSTRUMENTS) ×2 IMPLANT
NDL SAFETY ECLIPSE 18X1.5 (NEEDLE) ×1 IMPLANT
NEEDLE HYPO 18GX1.5 SHARP (NEEDLE) ×4
NS IRRIG 1000ML POUR BTL (IV SOLUTION) ×2 IMPLANT
PACK TOTAL JOINT (CUSTOM PROCEDURE TRAY) ×2 IMPLANT
PASSER SUT SWANSON 36MM LOOP (INSTRUMENTS) ×2 IMPLANT
POSITIONER SURGICAL ARM (MISCELLANEOUS) ×2 IMPLANT
RESTRICTOR CEMENT SZ 5 C-STEM (Cement) ×1 IMPLANT
SPONGE GAUZE 4X4 12PLY (GAUZE/BANDAGES/DRESSINGS) ×2 IMPLANT
STRIP CLOSURE SKIN 1/2X4 (GAUZE/BANDAGES/DRESSINGS) ×3 IMPLANT
SUT ETHIBOND NAB CT1 #1 30IN (SUTURE) ×4 IMPLANT
SUT MNCRL AB 4-0 PS2 18 (SUTURE) ×2 IMPLANT
SUT VIC AB 2-0 CT1 27 (SUTURE) ×6
SUT VIC AB 2-0 CT1 TAPERPNT 27 (SUTURE) ×3 IMPLANT
SUT VLOC 180 0 24IN GS25 (SUTURE) ×4 IMPLANT
SYR 20CC LL (SYRINGE) ×1 IMPLANT
SYR 50ML LL SCALE MARK (SYRINGE) ×2 IMPLANT
TOWEL OR 17X26 10 PK STRL BLUE (TOWEL DISPOSABLE) ×4 IMPLANT
TOWEL OR NON WOVEN STRL DISP B (DISPOSABLE) ×2 IMPLANT
TRAY FOLEY CATH 14FRSI W/METER (CATHETERS) ×2 IMPLANT
WATER STERILE IRR 1500ML POUR (IV SOLUTION) ×3 IMPLANT

## 2012-11-06 NOTE — Anesthesia Procedure Notes (Signed)
Spinal  Patient location during procedure: OR Start time: 11/06/2012 1:50 PM End time: 11/06/2012 1:55 PM Staffing Anesthesiologist: Phillips Grout CRNA/Resident: Paris Lore Performed by: anesthesiologist and resident/CRNA  Preanesthetic Checklist Completed: patient identified, site marked, surgical consent, pre-op evaluation, timeout performed, IV checked, risks and benefits discussed and monitors and equipment checked Spinal Block Patient position: sitting Prep: Betadine Patient monitoring: heart rate, continuous pulse ox and blood pressure Approach: right paramedian Location: L2-3 Injection technique: single-shot Needle Needle type: Spinocan  Needle gauge: 22 G Needle length: 9 cm Needle insertion depth: 8 cm Assessment Sensory level: T4 Additional Notes Expiration date of kit checked and confirmed. Patient tolerated procedure well, without complications. First attempt without success X 1 MD Carignan X 1 noted clear CSF return and easy administration of medication.

## 2012-11-06 NOTE — Anesthesia Preprocedure Evaluation (Addendum)
Anesthesia Evaluation  Patient identified by MRN, date of birth, ID band Patient awake    Reviewed: Allergy & Precautions, H&P , NPO status , Patient's Chart, lab work & pertinent test results  Airway Mallampati: II TM Distance: >3 FB Neck ROM: Full    Dental  (+) Dental Advisory Given, Poor Dentition and Missing   Pulmonary shortness of breath, asthma , COPD breath sounds clear to auscultation        Cardiovascular hypertension, Pt. on medications Rhythm:Regular Rate:Normal     Neuro/Psych negative neurological ROS  negative psych ROS   GI/Hepatic Neg liver ROS, GERD-  Medicated,  Endo/Other  negative endocrine ROS  Renal/GU negative Renal ROS     Musculoskeletal negative musculoskeletal ROS (+)   Abdominal   Peds  Hematology negative hematology ROS (+)   Anesthesia Other Findings   Reproductive/Obstetrics negative OB ROS                          Anesthesia Physical Anesthesia Plan  ASA: III  Anesthesia Plan: Spinal   Post-op Pain Management:    Induction: Intravenous  Airway Management Planned: Simple Face Mask  Additional Equipment:   Intra-op Plan:   Post-operative Plan:   Informed Consent: I have reviewed the patients History and Physical, chart, labs and discussed the procedure including the risks, benefits and alternatives for the proposed anesthesia with the patient or authorized representative who has indicated his/her understanding and acceptance.   Dental advisory given  Plan Discussed with: CRNA  Anesthesia Plan Comments:        Anesthesia Quick Evaluation

## 2012-11-06 NOTE — Brief Op Note (Signed)
11/06/2012  3:28 PM  PATIENT:  Wendy Torres  72 y.o. female  PRE-OPERATIVE DIAGNOSIS:  Osteoarthritis of the Right Hip with protrusio deformity  POST-OPERATIVE DIAGNOSIS:  Osteoarthritis of the Right Hip with protrusio deformity  PROCEDURE:  Procedure(s): RIGHT TOTAL HIP ARTHROPLASTY WITH ACETABULAR AUTOGRAFT (Right)  SURGEON:  Surgeon(s) and Role:    * Loanne Drilling, MD - Primary  PHYSICIAN ASSISTANT:   ASSISTANTS: Avel Peace, PA-C   ANESTHESIA:   spinal  EBL:  Total I/O In: 1000 [I.V.:1000] Out: 650 [Urine:500; Blood:150]  BLOOD ADMINISTERED:none  DRAINS: (Medium) Hemovact drain(s) in the right hip with  Suction Open   LOCAL MEDICATIONS USED:  OTHER Exparel  COUNTS:  YES  TOURNIQUET:  * No tourniquets in log *  DICTATION: .Other Dictation: Dictation Number [id number]  PLAN OF CARE: Admit to inpatient   PATIENT DISPOSITION:  PACU - hemodynamically stable.

## 2012-11-06 NOTE — Anesthesia Postprocedure Evaluation (Signed)
Anesthesia Post Note  Patient: Wendy Torres  Procedure(s) Performed: Procedure(s) (LRB): RIGHT TOTAL HIP ARTHROPLASTY WITH ACETABULAR AUTOGRAFT (Right)  Anesthesia type: Spinal  Patient location: PACU  Post pain: Pain level controlled  Post assessment: Post-op Vital signs reviewed  Last Vitals: BP 175/79  Pulse 69  Temp(Src) 36.3 C (Oral)  Resp 15  SpO2 98%  Post vital signs: Reviewed  Level of consciousness: sedated  Complications: No apparent anesthesia complications

## 2012-11-06 NOTE — Transfer of Care (Signed)
Immediate Anesthesia Transfer of Care Note  Patient: Wendy Torres  Procedure(s) Performed: Procedure(s): RIGHT TOTAL HIP ARTHROPLASTY WITH ACETABULAR AUTOGRAFT (Right)  Patient Location: PACU  Anesthesia Type:Spinal  Level of Consciousness: awake, alert , oriented and patient cooperative  Airway & Oxygen Therapy: Patient Spontanous Breathing and Patient connected to face mask oxygen  Post-op Assessment: Report given to PACU RN and Post -op Vital signs reviewed and stable  Post vital signs: stable  Complications: No apparent anesthesia complications  T12 spinal level

## 2012-11-06 NOTE — Preoperative (Signed)
Beta Blockers   Reason not to administer Beta Blockers:Not Applicable, not on home BB 

## 2012-11-06 NOTE — H&P (View-Only) (Signed)
Wendy Torres  DOB: 08/03/1940 Married / Language: English / Race: Black or African American Female  Date of Admission:  11/06/2012  Chief Complaint:  Right Hip Pain  History of Present Illness The patient is a 72 year old female who comes in for a preoperative History and Physical. The patient is scheduled for a right total hip arthroplasty (acetabular autografting) to be performed by Dr. Gus Rankin. Aluisio, MD at [facility] on 11/06/2012. The patient is a 72 year old female who presents with knee complaints. The patient reports right knee symptoms including: pain, swelling and stiffness which began 1 year(s) ago without any known injury. The patient describes the severity of the symptoms as moderate in severity.The patient feels that the symptoms are worsening. Symptoms are exacerbated by weight bearing and walking. She has been having pain in both knees, right greater than left, for several years but it has been worse over the last year. She has started walking with a walker about 6 months ago. She has never had injections or surgery on the knees. She does occasionally have some pain in the groin but much worse in the knees. She has been taking oxycodone from Dr.John for her knee. The right knee hurts more than the left. The pain is also occurring up in the groin. Anytime she tries to flex her leg up she will get a shooting pain down into the knee. Over the past few months this has gotten progressively worse. She is now requiring a walker to ambulate. The pain does occur, some at rest. This is far worse with activity. This also occurs at night. It is hard to get a comfortable position. The left side hurts a little bit, no where near as bad as the right. She feels her gait has become progressively more unsteady also.She has not had any injections in her knee. She is not currently having any significant low back pain. X-rays of both knees show that she does have some joint space  narrowing medial and lateral. No bone on bone changes. AP of the pelvis and lateral of her right hip shows she has a severe end stage arthritis of both hips, worse on the right than the left. She has a severe protrusio deformity on the right with bone on bone changes and with some small erosion of the femoral head. On the left hip she also has protrusio not quite as bad as the right.  The problem is coming from her hip. She has a horrible arthritic right hip. The left hip is arthritic also but not quite as bad. She has a severe protrusio on the right side. At this point, the only thing that is predictably going to get her better would be a total hip arthroplasty. She is not a candidate for an anterior approach given the severe protrusio. We will do a posterior approach. She will need an acetabular autografting. We did discuss all of this in detail. She would like to have this done as soon as possible. She is accompanied by her daughter today. We will call and get her set up for surgical treatment. They have been treated conservatively in the past for the above stated problem and despite conservative measures, they continue to have progressive pain and severe functional limitations and dysfunction. They have failed non-operative management including home exercise, medications. It is felt that they would benefit from undergoing total joint replacement. Risks and benefits of the procedure have been discussed with the patient and they  elect to proceed with surgery. There are no active contraindications to surgery such as ongoing infection or rapidly progressive neurological disease.   Problem List Osteoarthritis, Hip (715.35)  Allergies No Known Drug Allergies   Family History Cancer. brother   Social History Living situation. live with spouse Marital status. married Number of flights of stairs before winded. 4-5 Drug/Alcohol Rehab (Previously). no Exercise. Exercises rarely; does  other Illicit drug use. no Pain Contract. no Tobacco / smoke exposure. no Tobacco use. former smoker; smoke(d) 1 pack(s) per day; uses less than half 1/2 can(s) smokeless per week Current work status. unemployed Drug/Alcohol Rehab (Currently). no Children. 5 or more Alcohol use. former drinker   Medication History Omeprazole (20MG  Capsule DR, Oral) Active. CloNIDine HCl (0.3MG  Tablet, Oral) Active. Oxybutynin Chloride (5MG  Tablet, Oral) Active. Oxycodone-Acetaminophen (10-325MG  Tablet, Oral) Active. ALPRAZolam (0.25MG  Tablet, Oral) Active. Gabapentin (300MG  Capsule, Oral) Active. Furosemide (20MG  Tablet, Oral) Active. Multivitamin ( Oral) Active. Aspirin EC (325MG  Tablet DR, Oral) Active.   Past Surgical History Spinal Surgery Cesarean Section - 1. Date: 2002.   Medical History Gastroesophageal Reflux Disease High blood pressure Chronic Pain Hypercholesterolemia Rheumatoid Arthritis Herniated Discs L3-L4-L5 History of CVA approx. 5 years ago Cataract Past History of Bronchitis Eczema   Review of Systems General:Not Present- Chills, Fever, Night Sweats, Fatigue, Weight Gain, Weight Loss and Memory Loss. Skin:Not Present- Hives, Itching, Rash, Eczema and Lesions. HEENT:Present- Blurred Vision. Not Present- Tinnitus, Headache, Double Vision, Visual Loss, Hearing Loss and Dentures. Respiratory:Not Present- Shortness of breath with exertion, Shortness of breath at rest, Allergies, Coughing up blood and Chronic Cough. Cardiovascular:Present- Swelling. Not Present- Chest Pain, Racing/skipping heartbeats, Difficulty Breathing Lying Down, Murmur and Palpitations. Gastrointestinal:Present- Heartburn and Constipation. Not Present- Bloody Stool, Abdominal Pain, Vomiting, Nausea, Diarrhea, Difficulty Swallowing, Jaundice and Loss of appetitie. Female Genitourinary:Present- Urinating at Night. Not Present- Blood in Urine, Urinary frequency, Weak urinary stream,  Discharge, Flank Pain, Incontinence, Painful Urination, Urgency and Urinary Retention. Musculoskeletal:Present- Joint Pain, Back Pain and Morning Stiffness. Not Present- Muscle Weakness, Muscle Pain, Joint Swelling and Spasms. Neurological:Not Present- Tremor, Dizziness, Blackout spells, Paralysis, Difficulty with balance and Weakness. Psychiatric:Not Present- Insomnia.   Vitals  Weight: 173 lb Height: 63 in Weight was reported by patient. Height was reported by patient. Body Surface Area: 1.87 m Body Mass Index: 30.65 kg/m Pulse: 68 (Regular) Resp.: 14 (Unlabored) BP: 158/78 (Sitting, Right Arm, Standard)    Physical Exam  The physical exam findings are as follows:  Note: Patient is a 72 year old female with continued hip pain. Patient is accompanied today by her daughters Guinea and Steward Drone.   General Mental Status - Alert, cooperative and good historian. General Appearance- pleasant. Not in acute distress. Orientation- Oriented X3. Build & Nutrition- Well nourished and Well developed.   Head and Neck Head- normocephalic, atraumatic . Neck Global Assessment- supple. no bruit auscultated on the right and no bruit auscultated on the left.   Eye Pupil- Bilateral- Regular and Round. Motion- Bilateral- EOMI.   Chest and Lung Exam Auscultation: Breath sounds:- clear at anterior chest wall and - clear at posterior chest wall. Adventitious sounds:- No Adventitious sounds.   Cardiovascular Auscultation:Rhythm- Regular rate and rhythm. Heart Sounds- S1 WNL and S2 WNL. Murmurs & Other Heart Sounds:Auscultation of the heart reveals - No Murmurs.   Abdomen Palpation/Percussion:Tenderness- Abdomen is non-tender to palpation. RigidTity (guarding)- Abdomen is soft. Auscultation:Auscultation of the abdomen reveals - Bowel sounds normal.   Female Genitourinary  Not done, not pertinent to present illness  Musculoskeletal She is a  well developed female. She is alert and oriented. No apparent distress. Evaluation of her left hip, flexion to 110, minimal internal rotation to about 10 external rotation and 30 abduction. The right hip flexion is to about 90. No internal or external rotation. There is about 10 degrees of abduction. When I flex and rotate the right hip it will cause a sharp pain in her knee which is the pain she is generally experiencing in the knee. Evaluation of both knees shows no effusion in either knee. Range of motion is about 5-125 on each side. There is crepitus on range of motion. There is no specific joint line tenderness and no instability about the knees.  RADIOGRAPHS: AP and lateral of both knees show that she does have some joint space narrowing medial and lateral. No bone on bone changes. AP of the pelvis and lateral of her right hip shows she has a severe end stage arthritis of both hips, worse on the right than the left. She has a severe protrusio deformity on the right with bone on bone changes and with some small erosion of the femoral head. On the left hip she also has protrusio not quite as bad as the right. There is some degenerative change in the head also.  Assessment & Plan Osteoarthritis, Hip (715.35) Impression: Right Hip  Note: Plan is for a Right Total Hip Replacement with Acetabular Autografting by Dr. Lequita Halt.  Plan is to go to Rehab following surgery.  The patient will NOT receive TXA (tranexamic acid) due to: History of Stroke  Signed electronically by Lauraine Rinne, III PA-C

## 2012-11-06 NOTE — Interval H&P Note (Signed)
History and Physical Interval Note:  11/06/2012 12:59 PM  Wendy Torres  has presented today for surgery, with the diagnosis of Osteoarthritis of the Right Hip  The various methods of treatment have been discussed with the patient and family. After consideration of risks, benefits and other options for treatment, the patient has consented to  Procedure(s): RIGHT TOTAL HIP ARTHROPLASTY WITH ACETABULAR AUTOGRAFT (Right) as a surgical intervention .  The patient's history has been reviewed, patient examined, no change in status, stable for surgery.  I have reviewed the patient's chart and labs.  Questions were answered to the patient's satisfaction.     Loanne Drilling

## 2012-11-07 ENCOUNTER — Encounter (HOSPITAL_COMMUNITY): Payer: Self-pay | Admitting: Orthopedic Surgery

## 2012-11-07 DIAGNOSIS — E876 Hypokalemia: Secondary | ICD-10-CM

## 2012-11-07 LAB — BASIC METABOLIC PANEL
BUN: 6 mg/dL (ref 6–23)
CO2: 27 mEq/L (ref 19–32)
Calcium: 9.4 mg/dL (ref 8.4–10.5)
Chloride: 102 mEq/L (ref 96–112)
Creatinine, Ser: 0.69 mg/dL (ref 0.50–1.10)
GFR calc Af Amer: >90 mL/min (ref >90)

## 2012-11-07 LAB — CBC
HCT: 31.6 % — ABNORMAL LOW (ref 36.0–46.0)
MCH: 27 pg (ref 26.0–34.0)
MCV: 82.7 fL (ref 78.0–100.0)
Platelets: 265 10*3/uL (ref 150–400)
RDW: 13.3 % (ref 11.5–15.5)

## 2012-11-07 NOTE — Progress Notes (Signed)
Clinical Social Work Department BRIEF PSYCHOSOCIAL ASSESSMENT 11/07/2012  Patient:  Wendy Torres,Wendy Torres     Account Number:  [id number]     Admit date:  11/06/2012  Clinical Social Worker:  Phuong Hillary, LCSW  Date/Time:  11/07/2012 01:01 PM  Referred by:  Physician  Date Referred:  11/07/2012 Referred for  SNF Placement   Other Referral:   Interview type:  Patient Other interview type:    PSYCHOSOCIAL DATA Living Status:  ALONE Admitted from facility:   Level of care:   Primary support name:  Wendy Torres Primary support relationship to patient:  CHILD, ADULT Degree of support available:   supportive    CURRENT CONCERNS Current Concerns  Post-Acute Placement   Other Concerns:    SOCIAL WORK ASSESSMENT / PLAN  Assessment/plan status:   Other assessment/ plan:   Pt is a 71 yr old female living at home prior to hospitalization. CSW met with pt / daughter to assist with d/c planning. Pt plans to have ST Rehab following hospital d/c. Pt is requesting [facility] for her rehab. Clinicals have been sent to SNF and decision is pending. CSW will continue to follow to asist with d/c planning to SNF.   Information/referral to community resources:   Insurance coverage for SNF and ambulance transport reviewed.    PATIENT'S/FAMILY'S RESPONSE TO PLAN OF CARE: Pt is hopeful that [facility] will offer placement. She is looking forward to starting rehab.   Wendy Friedhoff LCSW [phone]     

## 2012-11-07 NOTE — Progress Notes (Signed)
   Subjective: 1 Day Post-Op Procedure(s) (LRB): RIGHT TOTAL HIP ARTHROPLASTY WITH ACETABULAR AUTOGRAFT (Right) Patient reports pain as mild and moderate.   Patient seen in rounds with Dr. Lequita Halt. Patient was well, but has had some minor complaints of pain in the hip, requiring pain medications We will start therapy today.  Plan is to go Skilled nursing facility after hospital stay.  Objective: Vital signs in last 24 hours: Temp:  [97.4 F (36.3 C)-98.7 F (37.1 C)] 98.6 F (37 C) (07/10 0620) Pulse Rate:  [60-88] 64 (07/10 0620) Resp:  [13-18] 18 (07/10 0747) BP: (96-190)/(52-94) 156/77 mmHg (07/10 0620) SpO2:  [94 %-100 %] 98 % (07/10 0747) Weight:  [83.19 kg (183 lb 6.4 oz)] 83.19 kg (183 lb 6.4 oz) (07/09 1759)  Intake/Output from previous day:  Intake/Output Summary (Last 24 hours) at 11/07/12 0805 Last data filed at 11/07/12 0700  Gross per 24 hour  Intake 3133.75 ml  Output   4305 ml  Net -1171.25 ml    Intake/Output this shift: UOP 2150 since MN -1171  Labs:  Recent Labs  11/07/12 0356  HGB 10.3*    Recent Labs  11/07/12 0356  WBC 9.0  RBC 3.82*  HCT 31.6*  PLT 265    Recent Labs  11/07/12 0356  NA 138  K 3.0*  CL 102  CO2 27  BUN 6  CREATININE 0.69  GLUCOSE 166*  CALCIUM 9.4   No results found for this basename: LABPT, INR,  in the last 72 hours  EXAM General - Patient is Alert, Appropriate and Oriented Extremity - Neurovascular intact Sensation intact distally Dorsiflexion/Plantar flexion intact Dressing - dressing C/D/I Motor Function - intact, moving foot and toes well on exam.  Hemovac pulled without difficulty.  Past Medical History  Diagnosis Date  . HYPERLIPIDEMIA 04/18/2007  . Morbid obesity 12/09/2006  . ANEMIA 01/14/2009  . Chronic pain syndrome 02/16/2009  . HYPERTENSION 12/09/2006  . ACUTE URIS OF UNSPECIFIED SITE 10/22/2007  . ALLERGIC RHINITIS 12/09/2006  . ASTHMA 12/09/2006  . COPD 12/09/2006  . Glossitis  01/14/2009  . GERD 12/09/2006  . HIATAL HERNIA 03/01/2010  . OVERACTIVE BLADDER 04/14/2008  . DEGENERATIVE JOINT DISEASE 12/09/2006  . LOW BACK PAIN 12/09/2006  . OSTEOPOROSIS 12/09/2006  . SYNCOPE 11/05/2008  . DIZZINESS 03/30/2010  . INSOMNIA-SLEEP DISORDER-UNSPEC 11/05/2008  . HYPERSOMNIA 08/31/2008  . PALPITATIONS, RECURRENT 01/27/2010  . DYSPNEA 01/27/2010  . Other dysphagia 01/14/2009  . FREQUENCY, URINARY 03/30/2010  . Abdominal pain, right upper quadrant 11/28/2007  . EPIGASTRIC PAIN 01/14/2009  . Abdominal  pain, other specified site 11/28/2007  . Nonspecific (abnormal) findings on radiological and other examination of body structure 11/05/2008  . TRANSIENT ISCHEMIC ATTACK, HX OF 12/09/2006  . HELICOBACTER PYLORI GASTRITIS, HX OF 12/09/2006  . ABNORMAL CHEST XRAY 11/05/2008  . Gait disorder 09/04/2011    Assessment/Plan: 1 Day Post-Op Procedure(s) (LRB): RIGHT TOTAL HIP ARTHROPLASTY WITH ACETABULAR AUTOGRAFT (Right) Principal Problem:   OA (osteoarthritis) of hip Active Problems:   Hypokalemia  Estimated body mass index is 31.47 kg/(m^2) as calculated from the following:   Height as of this encounter: 5\' 4"  (1.626 m).   Weight as of this encounter: 83.19 kg (183 lb 6.4 oz). Advance diet Up with therapy Discharge to SNF  DVT Prophylaxis - Xarelto, Aspirin 325 mg on hold Weight Bearing As Tolerated right Leg D/C Knee Immobilizer Hemovac Pulled Begin Therapy Hip Preacutions No vaccines.  PERKINS, ALEXZANDREW 11/07/2012, 8:05 AM

## 2012-11-07 NOTE — Op Note (Signed)
NAMEKOLLINS, FENTER NO.:  [id number]  MEDICAL RECORD NO.:  [id number]  LOCATION:  1604                         FACILITY:  [facility]  PHYSICIAN:  Ollen Gross, M.D.    DATE OF BIRTH:  14-Oct-1940  DATE OF PROCEDURE:  11/06/2012 DATE OF DISCHARGE:                              OPERATIVE REPORT   PREOPERATIVE DIAGNOSIS:  Osteoarthritis, right hip with protrusio deformity.  POSTOPERATIVE DIAGNOSIS:  Osteoarthritis, right hip with protrusio deformity.  PROCEDURE:  Right total hip arthroplasty with acetabular autograft.  SURGEON:  Ollen Gross, M.D.  ASSISTANT:  Avel Peace, [facility].  ANESTHESIA:  Spinal.  ESTIMATED BLOOD LOSS:  150.  DRAINS:  Hemovac x1.  COMPLICATIONS:  None.  CONDITION:  Stable to recovery.  BRIEF CLINICAL NOTE:  Ms. Tidd is a 72 year old female with advanced end-stage arthritis of her right hip with severe protrusio deformity. She has persistent constant pain, with progressively worsening dysfunction.  She presents now for right total hip arthroplasty.  PROCEDURE IN DETAIL:  After successful administration of spinal anesthetic, the patient was placed in the left lateral decubitus position with the right side up and held with the hip positioner.  Right lower extremity was isolated from her perineum with plastic drapes and prepped and draped in the usual sterile fashion.  Short posterolateral incision was made with a 10 blade through subcutaneous tissue to the fascia lata which was incised in line with the skin incision.  Sciatic nerve was palpated and protected.  Short rotators and capsular isolated off the femur.  We might could not dislocate her hip because of the severe protrusio.  The hip was essentially fused.  We cut the femoral neck in situ with an oscillating saw.  I then removed the femoral head from the acetabulum.  Then, began femoral preparation.  Femoral retractors were placed.  The starter reamer was passed in  the femoral canal.  Canal was thoroughly irrigated with saline to remove the fatty contents.  Using a lateralizing reamer, then started broaching starting at size 1 coursing up to a size 4, which had excellent torsional and rotational stability.  We then retracted the femur anteriorly to gain acetabular exposure. Acetabular retractors were placed and labrum and osteophytes removed. She had severe protrusio deformity.  We started 45 reamer just to freshen up the bone medially.  I coursed up to 53 mm for placement of 54 mm pinnacle acetabular shell.  I then took her femoral head and reamed into that again cancellous graft.  We placed the graft into the medial aspect of the acetabulum to build out more laterally to a normal center of rotation.  The size 54-mm pinnacle acetabular shell was then impacted into the acetabulum with outstanding purchase.  I did not need any additional dome screws.  The 36-mm neutral +4 marathon liner was placed.  We then placed a trial size 4 standard neck onto the trial broach and a 36+ 1.5 head.  The hip was reduced with outstanding stability.  Full extension, full external rotation, 70 degrees flexion, 40 degrees adduction, 90 degrees internal rotation, 90 degrees of flexion, 70 degrees of internal rotation.  I placed the right leg on top  of the left, it felt equal.  Hips then dislocated and trials removed.  We trialed the cement restrictor which was a size 5.  Size 5 cement restrictor was then placed in appropriate depth in the femoral canal. The canal was thoroughly prepared with pulsatile lavage while the cement was mixed.  Once ready for implantation, it was then injected into the femoral canal and pressurized.  The size 4 standard offset [place] cemented stem was then placed in about 20 degrees of anteversion. Extruded cement was removed.  Once cement hardened, then the permanent 36+ 1.5 metal head was placed.  The hips reduced with same  stability parameters.  Wound was copiously irrigated with saline solution.  Short rotators reattached to the femur through drill holes with Ethibond suture.  Fascia lata was closed over Hemovac drain with running #1 V- Loc, subcu closed deep with another V-Loc, and superficial with 2-0 Vicryl and then subcuticular closed running 4-0 Monocryl.  The drain was hooked to suction.  Incision cleaned and dried.  Steri-Strips and a bulky sterile dressing applied.  Please note that prior to closure, a total of 20 mL of Exparel mixed with 30 mL saline and then additionally another 20 mL of 0.25% Marcaine are injected into the capsule of the gluteal muscles and subcu tissues.  After completion of the procedure, her right lower extremity placed in a knee immobilizer.  She was awakened and transported to recovery in stable condition.  Please note that a surgical assistant was a medical necessity for this procedure in order to perform in a safe and expeditious manner.  Surgical assistant was necessary for appropriate protection of the vital neurovascular structures as well as proper positioning of the limb to allow for accurate placement of the prosthetic components.     Ollen Gross, M.D.     FA/MEDQ  D:  11/06/2012  T:  11/07/2012  Job:  [id number]

## 2012-11-07 NOTE — Evaluation (Signed)
Physical Therapy Evaluation Patient Details Name: Wendy Torres MRN: [id number] DOB: [birth year] Today's Date: 11/07/2012 Time: 4098-1191 PT Time Calculation (min): 33 min  PT Assessment / Plan / Recommendation History of Present Illness     Clinical Impression  Pt s/p R THR presents with functional mobility limited by decreased R LE strength/ROM, post op pain and post THP.    PT Assessment  Patient needs continued PT services    Follow Up Recommendations  SNF    Does the patient have the potential to tolerate intense rehabilitation      Barriers to Discharge        Equipment Recommendations  None recommended by PT    Recommendations for Other Services OT consult   Frequency 7X/week    Precautions / Restrictions Precautions Precautions: Posterior Hip;Fall Precaution Comments: sign hung in room  Restrictions Weight Bearing Restrictions: No Other Position/Activity Restrictions: WBAT   Pertinent Vitals/Pain 4/10; premed      Mobility  Bed Mobility Bed Mobility: Supine to Sit Supine to Sit: 3: Mod assist Details for Bed Mobility Assistance: cues for sequence and use of L LE to self assist Transfers Transfers: Sit to Stand;Stand to Sit Sit to Stand: 3: Mod assist Stand to Sit: 3: Mod assist Details for Transfer Assistance: cues for LE management and use of UEs to self assist Ambulation/Gait Ambulation/Gait Assistance: 3: Mod assist Ambulation Distance (Feet): 19 Feet Assistive device: Rolling walker Ambulation/Gait Assistance Details: cues for sequence, posture, position from RW Gait Pattern: Step-to pattern;Decreased step length - right;Decreased step length - left;Antalgic;Trunk flexed Stairs: No    Exercises Total Joint Exercises Ankle Circles/Pumps: AROM;Both;10 reps;Supine Quad Sets: AROM;Both;10 reps;Supine Heel Slides: AAROM;15 reps;Supine;Right Hip ABduction/ADduction: AROM;10 reps;Supine;Right   PT Diagnosis: Difficulty walking  PT Problem List:  Decreased strength;Decreased range of motion;Decreased activity tolerance;Decreased mobility;Decreased knowledge of use of DME;Pain;Decreased knowledge of precautions PT Treatment Interventions: DME instruction;Gait training;Stair training;Functional mobility training;Therapeutic activities;Therapeutic exercise;Patient/family education     PT Goals(Current goals can be found in the care plan section) Acute Rehab PT Goals Patient Stated Goal: Rehab and home to resume previous lifestyle with decreased pain PT Goal Formulation: With patient Potential to Achieve Goals: Good  Visit Information  Last PT Received On: 11/07/12 Assistance Needed: +1       Prior Functioning  Home Living Family/patient expects to be discharged to:: Skilled nursing facility Living Arrangements: Spouse/significant other Prior Function Level of Independence: Independent with assistive device(s) Communication Communication: No difficulties Dominant Hand: Right    Cognition  Cognition Arousal/Alertness: Awake/alert Behavior During Therapy: WFL for tasks assessed/performed Overall Cognitive Status: Within Functional Limits for tasks assessed    Extremity/Trunk Assessment Upper Extremity Assessment Upper Extremity Assessment: Overall WFL for tasks assessed Lower Extremity Assessment Lower Extremity Assessment: RLE deficits/detail;LLE deficits/detail RLE Deficits / Details: hip strength 2+/5 with AAROM at hip to 80 flex and 15 abd LLE Deficits / Details: hip strength WFL with AAROM at hip to 90 flex and 15 abd Cervical / Trunk Assessment Cervical / Trunk Assessment: Normal   Balance    End of Session PT - End of Session Equipment Utilized During Treatment: Gait belt Activity Tolerance: Patient tolerated treatment well Patient left: in chair;with call bell/phone within reach;with family/visitor present Nurse Communication: Mobility status  GP     Alix Lahmann 11/07/2012, 12:56 PM

## 2012-11-07 NOTE — Progress Notes (Signed)
Clinical Social Work Department BRIEF PSYCHOSOCIAL ASSESSMENT 11/07/2012  Patient:  Wendy Torres, Wendy Torres     Account Number:  [id number]     Admit date:  11/06/2012  Clinical Social Worker:  Candie Chroman  Date/Time:  11/07/2012 01:01 PM  Referred by:  Physician  Date Referred:  11/07/2012 Referred for  SNF Placement   Other Referral:   Interview type:  Patient Other interview type:    PSYCHOSOCIAL DATA Living Status:  ALONE Admitted from facility:   Level of care:   Primary support name:  Steward Drone Primary support relationship to patient:  CHILD, ADULT Degree of support available:   supportive    CURRENT CONCERNS Current Concerns  Post-Acute Placement   Other Concerns:    SOCIAL WORK ASSESSMENT / PLAN  Assessment/plan status:   Other assessment/ plan:   Pt is a 72 yr old female living at home prior to hospitalization. CSW met with pt / daughter to assist with d/c planning. Pt plans to have ST Rehab following hospital d/c. Pt is requesting [facility] for her rehab. Clinicals have been sent to SNF and decision is pending. CSW will continue to follow to asist with d/c planning to SNF.   Information/referral to community resources:   Insurance coverage for SNF and ambulance transport reviewed.    PATIENT'S/FAMILY'S RESPONSE TO PLAN OF CARE: Pt is hopeful that [facility] will offer placement. She is looking forward to starting rehab.   Cori Razor LCSW [phone]

## 2012-11-07 NOTE — Evaluation (Addendum)
Occupational Therapy Evaluation Patient Details Name: Wendy Torres MRN: [id number] DOB: 1941/03/24 Today's Date: 11/07/2012 Time: 1235-1300 OT Time Calculation (min): 25 min  OT Assessment / Plan / Recommendation History of present illness Pt presents with R THA   Clinical Impression   Pt motivated and overall mod-max A with ADL today. Began education with AE but didn't practice yet. Family present for education. Will benefit from OT to improve ADL independence for next venue.     OT Assessment  Patient needs continued OT Services    Follow Up Recommendations  SNF;Supervision/Assistance - 24 hour    Barriers to Discharge      Equipment Recommendations  3 in 1 bedside comode    Recommendations for Other Services    Frequency  Min 2X/week    Precautions / Restrictions Precautions Precautions: Posterior Hip;Fall Precaution Comments: Reviewed precautions. Pt only able to state 1/3 on her own. Restrictions Weight Bearing Restrictions: No Other Position/Activity Restrictions: WBAT   Pertinent Vitals/Pain 4/10 after activity; reposition.    ADL  Eating/Feeding: Simulated;Independent Where Assessed - Eating/Feeding: Chair Grooming: Simulated;Wash/dry hands;Set up Where Assessed - Grooming: Supported sitting Upper Body Bathing: Simulated;Chest;Right arm;Left arm;Abdomen;Set up;Supervision/safety Where Assessed - Upper Body Bathing: Unsupported sitting Lower Body Bathing: Simulated;Maximal assistance (without AE) Where Assessed - Lower Body Bathing: Supported sit to stand Upper Body Dressing: Simulated;Supervision/safety;Set up Where Assessed - Upper Body Dressing: Unsupported sitting Lower Body Dressing: Simulated;Maximal assistance (without AE) Where Assessed - Lower Body Dressing: Supported sit to stand Toilet Transfer: Performed;Moderate assistance Toilet Transfer Method: Other (comment) (with walker into bathroom) Toilet Transfer Equipment: Raised toilet seat with arms  (or 3-in-1 over toilet) Toileting - Clothing Manipulation and Hygiene: Simulated;Maximal assistance (pt unsteady trying to let go with hands to manage gown) Where Assessed - Toileting Clothing Manipulation and Hygiene: Standing Equipment Used: Rolling walker ADL Comments: Introduced AE and explained use but didnt practice yet. Reviewed all precautions with pt for R hip. Pt fatigued after toilet transfer. Returned to bed.     OT Diagnosis: Generalized weakness  OT Problem List: Decreased strength;Decreased knowledge of precautions;Decreased knowledge of use of DME or AE OT Treatment Interventions: Self-care/ADL training;DME and/or AE instruction;Therapeutic activities;Patient/family education   OT Goals(Current goals can be found in the care plan section) Acute Rehab OT Goals Patient Stated Goal: Rehab and home to resume previous lifestyle with decreased pain OT Goal Formulation: With patient Time For Goal Achievement: 11/14/12 Potential to Achieve Goals: Good ADL Goals Pt Will Perform Grooming: with min guard assist;standing Pt Will Perform Lower Body Bathing: with min assist;with adaptive equipment;sit to/from stand Pt Will Perform Lower Body Dressing: with min assist;with adaptive equipment;sit to/from stand Pt Will Transfer to Toilet: with min assist;ambulating;bedside commode Pt Will Perform Toileting - Clothing Manipulation and hygiene: with min assist;sit to/from stand  Visit Information  Last OT Received On: 11/07/12 Assistance Needed: +1 History of Present Illness: Pt presents with R THA       Prior Functioning     Home Living Family/patient expects to be discharged to:: Skilled nursing facility Living Arrangements: Spouse/significant other Prior Function Level of Independence: Independent with assistive device(s) Communication Communication: No difficulties Dominant Hand: Right         Vision/Perception     Cognition  Cognition Arousal/Alertness:  Awake/alert Behavior During Therapy: WFL for tasks assessed/performed Overall Cognitive Status: Within Functional Limits for tasks assessed    Extremity/Trunk Assessment Upper Extremity Assessment Upper Extremity Assessment: Overall WFL for tasks assessed  Mobility Bed Mobility Bed Mobility: Sit to Supine Sit to Supine: 4: Min assist;HOB elevated Details for Bed Mobility Assistance: assist for LEs onto bed.  Transfers Transfers: Sit to Stand;Stand to Sit Sit to Stand: 3: Mod assist;With upper extremity assist;From chair/3-in-1 Stand to Sit: 3: Mod assist;With upper extremity assist;To chair/3-in-1;To bed Details for Transfer Assistance: verbal cues for hand placement and LE management. assist to control descent.        Balance Balance Balance Assessed: Yes Dynamic Standing Balance Dynamic Standing - Level of Assistance: 3: Mod assist   End of Session OT - End of Session Equipment Utilized During Treatment: Gait belt Activity Tolerance: Patient tolerated treatment well Patient left: in bed;with call bell/phone within reach;with family/visitor present  GO     Lennox Laity [phone] 11/07/2012, 1:14 PM

## 2012-11-07 NOTE — Progress Notes (Signed)
Physical Therapy Treatment Patient Details Name: Wendy Torres MRN: [id number] DOB: 01-12-1941 Today's Date: 11/07/2012 Time: 6578-4696 PT Time Calculation (min): 23 min  PT Assessment / Plan / Recommendation  PT Comments     Follow Up Recommendations  SNF     Does the patient have the potential to tolerate intense rehabilitation     Barriers to Discharge        Equipment Recommendations  None recommended by PT    Recommendations for Other Services OT consult  Frequency 7X/week   Progress towards PT Goals    Plan Current plan remains appropriate    Precautions / Restrictions Precautions Precautions: Posterior Hip;Fall Precaution Comments: Reviewed precautions. Pt only able to state 1/3 on her own. Restrictions Weight Bearing Restrictions: No Other Position/Activity Restrictions: WBAT   Pertinent Vitals/Pain 4/10, ice pack provided, RN supplying ice pack    Mobility  Bed Mobility Bed Mobility: Supine to Sit Supine to Sit: 4: Min assist;3: Mod assist Sit to Supine: 4: Min assist;HOB elevated Details for Bed Mobility Assistance: cues for sequence, use of L LE to self assist and adference to THP Transfers Transfers: Sit to Stand;Stand to Sit Sit to Stand: 4: Min assist;3: Mod assist Stand to Sit: 4: Min assist;3: Mod assist Details for Transfer Assistance: verbal cues for hand placement and LE management. assist to control descent. Ambulation/Gait Ambulation/Gait Assistance: 3: Mod assist Ambulation Distance (Feet): 58 Feet Assistive device: Rolling walker Ambulation/Gait Assistance Details: cues for posture, sequence, position from RW Gait Pattern: Step-to pattern;Decreased step length - right;Decreased step length - left;Antalgic;Trunk flexed Stairs: No    Exercises     PT Diagnosis:    PT Problem List:   PT Treatment Interventions:     PT Goals (current goals can now be found in the care plan section) Acute Rehab PT Goals Patient Stated Goal: Rehab and  home to resume previous lifestyle with decreased pain PT Goal Formulation: With patient Potential to Achieve Goals: Good  Visit Information  Last PT Received On: 11/07/12 Assistance Needed: +1 History of Present Illness: Pt presents with R THA    Subjective Data  Patient Stated Goal: Rehab and home to resume previous lifestyle with decreased pain   Cognition  Cognition Arousal/Alertness: Awake/alert Behavior During Therapy: WFL for tasks assessed/performed Overall Cognitive Status: Within Functional Limits for tasks assessed    Balance  Balance Balance Assessed: Yes Dynamic Standing Balance Dynamic Standing - Level of Assistance: 3: Mod assist  End of Session PT - End of Session Equipment Utilized During Treatment: Gait belt Activity Tolerance: Patient tolerated treatment well Patient left: in chair;with call bell/phone within reach;with family/visitor present Nurse Communication: Mobility status   GP     Wendy Torres 11/07/2012, 4:15 PM

## 2012-11-08 LAB — BASIC METABOLIC PANEL
CO2: 26 mEq/L (ref 19–32)
Chloride: 104 mEq/L (ref 96–112)
Creatinine, Ser: 0.79 mg/dL (ref 0.50–1.10)
Glucose, Bld: 132 mg/dL — ABNORMAL HIGH (ref 70–99)
Sodium: 136 mEq/L (ref 135–145)

## 2012-11-08 LAB — CBC
Hemoglobin: 9.6 g/dL — ABNORMAL LOW (ref 12.0–15.0)
MCV: 81.7 fL (ref 78.0–100.0)
Platelets: 224 10*3/uL (ref 150–400)
RBC: 3.56 MIL/uL — ABNORMAL LOW (ref 3.87–5.11)
WBC: 11.5 10*3/uL — ABNORMAL HIGH (ref 4.0–10.5)

## 2012-11-08 MED ORDER — RIVAROXABAN 10 MG PO TABS: 10.0000 mg | ORAL_TABLET | Freq: Every day | ORAL | Status: DC

## 2012-11-08 MED ORDER — OXYCODONE HCL 5 MG PO TABS: 5.0000 mg | ORAL_TABLET | ORAL | Status: DC | PRN

## 2012-11-08 MED ORDER — DIPHENHYDRAMINE HCL 12.5 MG/5ML PO ELIX: 12.5000 mg | ORAL_SOLUTION | ORAL | Status: DC | PRN

## 2012-11-08 MED ORDER — BISACODYL 10 MG RE SUPP: 10.0000 mg | Freq: Every day | RECTAL | Status: DC | PRN

## 2012-11-08 MED ORDER — METHOCARBAMOL 500 MG PO TABS: 500.0000 mg | ORAL_TABLET | Freq: Four times a day (QID) | ORAL | Status: DC | PRN

## 2012-11-08 MED ORDER — TRAMADOL HCL 50 MG PO TABS: 50.0000 mg | ORAL_TABLET | Freq: Four times a day (QID) | ORAL | Status: DC | PRN

## 2012-11-08 MED ORDER — METOCLOPRAMIDE HCL 5 MG PO TABS: 5.0000 mg | ORAL_TABLET | Freq: Three times a day (TID) | ORAL | Status: DC | PRN

## 2012-11-08 MED ORDER — ONDANSETRON HCL 4 MG PO TABS: 4.0000 mg | ORAL_TABLET | Freq: Four times a day (QID) | ORAL | Status: DC | PRN

## 2012-11-08 MED ORDER — POLYETHYLENE GLYCOL 3350 17 G PO PACK: 17.0000 g | PACK | Freq: Every day | ORAL | Status: DC | PRN

## 2012-11-08 NOTE — Progress Notes (Signed)
CSW assisting with d/c planning. SNF bed available at [facility] when pt is stable for d/c. Tentative D/C summary needed today if pt to d/c over the weekend.   Cori Razor LCSW [phone]

## 2012-11-08 NOTE — Care Management Note (Signed)
    Page 1 of 1   11/08/2012     10:49:57 AM   CARE MANAGEMENT NOTE 11/08/2012  Patient:  Wendy Torres, Wendy Torres   Account Number:  [id number]  Date Initiated:  11/08/2012  Documentation initiated by:  Colleen Can  Subjective/Objective Assessment:   dx rt total hip replacemnt     Action/Plan:   SNF rehab   Anticipated DC Date:  11/04/2012   Anticipated DC Plan:  SKILLED NURSING FACILITY  In-house referral  Clinical Social Worker      DC Planning Services  CM consult      Choice offered to / List presented to:             Status of service:  Completed, signed off Medicare Important Message given?   (If response is "NO", the following Medicare IM given date fields will be blank) Date Medicare IM given:   Date Additional Medicare IM given:    Discharge Disposition:    Per UR Regulation:    If discussed at Long Length of Stay Meetings, dates discussed:    Comments:  11/08/2012 Colleen Can BSN RN CCM [phone] CM will assist as needed.

## 2012-11-08 NOTE — Progress Notes (Signed)
Physical Therapy Treatment Patient Details Name: Wendy Torres MRN: [id number] DOB: 01/25/1941 Today's Date: 11/08/2012 Time: 7829-5621 PT Time Calculation (min): 28 min  PT Assessment / Plan / Recommendation  PT Comments     Follow Up Recommendations  SNF     Does the patient have the potential to tolerate intense rehabilitation     Barriers to Discharge        Equipment Recommendations  None recommended by PT    Recommendations for Other Services OT consult  Frequency 7X/week   Progress towards PT Goals Progress towards PT goals: Progressing toward goals  Plan Current plan remains appropriate    Precautions / Restrictions Precautions Precautions: Posterior Hip;Fall Precaution Comments: pt recalled 2/3 THP - all precautions reviewed Restrictions Weight Bearing Restrictions: No RLE Weight Bearing: Weight bearing as tolerated Other Position/Activity Restrictions: WBAT   Pertinent Vitals/Pain 4/10; premed, ice pack provided    Mobility  Bed Mobility Bed Mobility: Sit to Supine Sit to Supine: 4: Min assist;HOB elevated Details for Bed Mobility Assistance: cues for sequence, use of L LE to self assist and adference to THP Transfers Transfers: Sit to Stand;Stand to Sit Sit to Stand: 4: Min assist;With upper extremity assist;From chair/3-in-1 Stand to Sit: 4: Min assist;With upper extremity assist;To bed Details for Transfer Assistance: verbal cues for hand placement and R LE management Ambulation/Gait Ambulation/Gait Assistance: 4: Min assist Ambulation Distance (Feet): 54 Feet Assistive device: Rolling walker Ambulation/Gait Assistance Details: cues for posture and position from RW Gait Pattern: Step-to pattern;Decreased step length - right;Decreased step length - left;Antalgic;Trunk flexed Stairs: No    Exercises Total Joint Exercises Ankle Circles/Pumps: AROM;Both;10 reps;Supine Quad Sets: AROM;Both;10 reps;Supine Gluteal Sets: AROM;10 reps;Both;Supine Heel  Slides: AAROM;Supine;Right;20 reps Hip ABduction/ADduction: AROM;Supine;Right;20 reps   PT Diagnosis:    PT Problem List:   PT Treatment Interventions:     PT Goals (current goals can now be found in the care plan section) Acute Rehab PT Goals Patient Stated Goal: Rehab and home to resume previous lifestyle with decreased pain PT Goal Formulation: With patient Potential to Achieve Goals: Good  Visit Information  Last PT Received On: 11/08/12 Assistance Needed: +1    Subjective Data  Subjective: I think my other hip hurts more than the new one Patient Stated Goal: Rehab and home to resume previous lifestyle with decreased pain   Cognition  Cognition Arousal/Alertness: Awake/alert Behavior During Therapy: WFL for tasks assessed/performed Overall Cognitive Status: Within Functional Limits for tasks assessed    Balance     End of Session PT - End of Session Equipment Utilized During Treatment: Gait belt Activity Tolerance: Patient tolerated treatment well Patient left: in chair;with family/visitor present;with bed alarm set Nurse Communication: Mobility status   GP     Wendy Torres 11/08/2012, 3:02 PM

## 2012-11-08 NOTE — Progress Notes (Signed)
Physical Therapy Treatment Patient Details Name: Wendy Torres MRN: [id number] DOB: [birth year] Today's Date: 11/08/2012 Time: 1010-1028 PT Time Calculation (min): 18 min  PT Assessment / Plan / Recommendation  PT Comments     Follow Up Recommendations  SNF     Does the patient have the potential to tolerate intense rehabilitation     Barriers to Discharge        Equipment Recommendations  None recommended by PT    Recommendations for Other Services OT consult  Frequency 7X/week   Progress towards PT Goals Progress towards PT goals: Progressing toward goals  Plan Current plan remains appropriate    Precautions / Restrictions Precautions Precautions: Posterior Hip;Fall Precaution Comments: pt recalled 2/3 THP - all precautions reviewed Restrictions Weight Bearing Restrictions: No RLE Weight Bearing: Weight bearing as tolerated Other Position/Activity Restrictions: WBAT   Pertinent Vitals/Pain 4/10; premed, ice pack provided    Mobility  Bed Mobility Bed Mobility: Supine to Sit Supine to Sit: 4: Min guard;HOB elevated Transfers Transfers: Sit to Stand;Stand to Sit Sit to Stand: 4: Min assist;With upper extremity assist;From chair/3-in-1 Stand to Sit: 4: Min assist;With upper extremity assist;To chair/3-in-1 Details for Transfer Assistance: verbal cues for hand placement and R LE management Ambulation/Gait Ambulation/Gait Assistance: 4: Min assist;3: Mod assist Ambulation Distance (Feet): 64 Feet Assistive device: Rolling walker Ambulation/Gait Assistance Details: cues for sequence, posture, position from RW and stride length Gait Pattern: Step-to pattern;Decreased step length - right;Decreased step length - left;Antalgic;Trunk flexed Stairs: No    Exercises     PT Diagnosis:    PT Problem List:   PT Treatment Interventions:     PT Goals (current goals can now be found in the care plan section) Acute Rehab PT Goals Patient Stated Goal: Rehab and home to  resume previous lifestyle with decreased pain PT Goal Formulation: With patient Potential to Achieve Goals: Good  Visit Information  Last PT Received On: 11/08/12 Assistance Needed: +1 History of Present Illness: Pt presents with R THA    Subjective Data  Patient Stated Goal: Rehab and home to resume previous lifestyle with decreased pain   Cognition  Cognition Arousal/Alertness: Awake/alert Behavior During Therapy: WFL for tasks assessed/performed Overall Cognitive Status: Within Functional Limits for tasks assessed    Balance  Balance Balance Assessed: Yes Dynamic Standing Balance Dynamic Standing - Level of Assistance: 4: Min assist  End of Session PT - End of Session Equipment Utilized During Treatment: Gait belt Activity Tolerance: Patient tolerated treatment well Patient left: in chair;with call bell/phone within reach;with family/visitor present Nurse Communication: Mobility status   GP     Wendy Torres 11/08/2012, 12:18 PM

## 2012-11-08 NOTE — Progress Notes (Signed)
Tentative D/C summary sent to [facility] for possible d/c SAT. Weekend CSW will assist with d/c planning. Pt/family have been updated and are in agreement with d/c plan.  Cori Razor LCSW [phone]

## 2012-11-08 NOTE — Progress Notes (Signed)
Utilization review completed.  

## 2012-11-08 NOTE — Progress Notes (Signed)
Occupational Therapy Treatment Patient Details Name: Wendy Torres MRN: [id number] DOB: 04-10-1941 Today's Date: 11/08/2012 Time: 4098-1191 OT Time Calculation (min): 28 min  OT Assessment / Plan / Recommendation  OT comments  Pt making good progress this visit with functional transfers to toilet. Continued to reinforce hip precautions.   Follow Up Recommendations  SNF;Supervision/Assistance - 24 hour    Barriers to Discharge       Equipment Recommendations  3 in 1 bedside comode    Recommendations for Other Services    Frequency Min 2X/week   Progress towards OT Goals Progress towards OT goals: Progressing toward goals  Plan Discharge plan remains appropriate    Precautions / Restrictions Precautions Precautions: Posterior Hip;Fall Precaution Comments: Reviewed all hip precautions Restrictions Weight Bearing Restrictions: No RLE Weight Bearing: Weight bearing as tolerated   Pertinent Vitals/Pain 510 premedicated; reposition, rest, ice    ADL  Toilet Transfer: Performed;Minimal assistance Toilet Transfer Method: Other (comment) (with walker into bathroom ) Toilet Transfer Equipment: Raised toilet seat with arms (or 3-in-1 over toilet) Toileting - Clothing Manipulation and Hygiene: Performed;Minimal assistance Where Assessed - Glass blower/designer Manipulation and Hygiene: Standing Equipment Used: Rolling walker ADL Comments: Demonstrated reacher and explained other AE (couldnt bring in all AE due to contact precautions) but pt states she is not interested in AE at this time. She states she has plenty of family that can help with LB ADL. She needs  verbal cues for not turning R foot inward during functional transfers but did much better overall with RW for toileting transfer.     OT Diagnosis:    OT Problem List:   OT Treatment Interventions:     OT Goals(current goals can now be found in the care plan section) Acute Rehab OT Goals Patient Stated Goal: Rehab and home to  resume previous lifestyle with decreased pain Potential to Achieve Goals: Good  Visit Information  Last OT Received On: 11/08/12 Assistance Needed: +1 History of Present Illness: Pt presents with R THA    Subjective Data      Prior Functioning       Cognition  Cognition Behavior During Therapy: WFL for tasks assessed/performed Overall Cognitive Status: Within Functional Limits for tasks assessed    Mobility  Bed Mobility Bed Mobility: Supine to Sit Supine to Sit: 4: Min guard;HOB elevated Transfers Transfers: Sit to Stand;Stand to Sit Sit to Stand: 4: Min assist;With upper extremity assist;From bed;From chair/3-in-1 Stand to Sit: 4: Min assist;With upper extremity assist;To chair/3-in-1 Details for Transfer Assistance: verbal cues for hand placement and R LE management    Exercises      Balance Balance Balance Assessed: Yes Dynamic Standing Balance Dynamic Standing - Level of Assistance: 4: Min assist   End of Session OT - End of Session Activity Tolerance: Patient tolerated treatment well Patient left: in chair;with call bell/phone within reach  GO     Lennox Laity [phone] 11/08/2012, 9:42 AM

## 2012-11-08 NOTE — Discharge Summary (Signed)
Physician Discharge Summary   Patient ID: Wendy Torres MRN: [id number] DOB/AGE: [birth year] 72 y.o.  Admit date: 11/06/2012 Discharge date: Tentative Date of Discharge - Saturday 11/09/2012  Primary Diagnosis:  Osteoarthritis, right hip with protrusio deformity.  Admission Diagnoses:  Past Medical History  Diagnosis Date  . HYPERLIPIDEMIA 04/18/2007  . Morbid obesity 12/09/2006  . ANEMIA 01/14/2009  . Chronic pain syndrome 02/16/2009  . HYPERTENSION 12/09/2006  . ACUTE URIS OF UNSPECIFIED SITE 10/22/2007  . ALLERGIC RHINITIS 12/09/2006  . ASTHMA 12/09/2006  . COPD 12/09/2006  . Glossitis 01/14/2009  . GERD 12/09/2006  . HIATAL HERNIA 03/01/2010  . OVERACTIVE BLADDER 04/14/2008  . DEGENERATIVE JOINT DISEASE 12/09/2006  . LOW BACK PAIN 12/09/2006  . OSTEOPOROSIS 12/09/2006  . SYNCOPE 11/05/2008  . DIZZINESS 03/30/2010  . INSOMNIA-SLEEP DISORDER-UNSPEC 11/05/2008  . HYPERSOMNIA 08/31/2008  . PALPITATIONS, RECURRENT 01/27/2010  . DYSPNEA 01/27/2010  . Other dysphagia 01/14/2009  . FREQUENCY, URINARY 03/30/2010  . Abdominal pain, right upper quadrant 11/28/2007  . EPIGASTRIC PAIN 01/14/2009  . Abdominal  pain, other specified site 11/28/2007  . Nonspecific (abnormal) findings on radiological and other examination of body structure 11/05/2008  . TRANSIENT ISCHEMIC ATTACK, HX OF 12/09/2006  . HELICOBACTER PYLORI GASTRITIS, HX OF 12/09/2006  . ABNORMAL CHEST XRAY 11/05/2008  . Gait disorder 09/04/2011   Discharge Diagnoses:   Principal Problem:   OA (osteoarthritis) of hip Active Problems:   Hypokalemia  Estimated body mass index is 31.47 kg/(m^2) as calculated from the following:   Height as of this encounter: 5\' 4"  (1.626 m).   Weight as of this encounter: 83.19 kg (183 lb 6.4 oz).  Procedure(s) (LRB): RIGHT TOTAL HIP ARTHROPLASTY WITH ACETABULAR AUTOGRAFT (Right)   Consults: None  HPI: Wendy Torres is a 72 year old female with advanced  end-stage arthritis of her right hip with severe protrusio  deformity.  She has persistent constant pain, with progressively worsening  dysfunction. She presents now for right total hip arthroplasty.   Laboratory Data: Admission on 11/06/2012  Component Date Value Range Status  . WBC 11/07/2012 9.0  4.0 - 10.5 K/uL Final  . RBC 11/07/2012 3.82* 3.87 - 5.11 MIL/uL Final  . Hemoglobin 11/07/2012 10.3* 12.0 - 15.0 g/dL Final  . HCT 56/21/3086 31.6* 36.0 - 46.0 % Final  . MCV 11/07/2012 82.7  78.0 - 100.0 fL Final  . MCH 11/07/2012 27.0  26.0 - 34.0 pg Final  . MCHC 11/07/2012 32.6  30.0 - 36.0 g/dL Final  . RDW 57/84/6962 13.3  11.5 - 15.5 % Final  . Platelets 11/07/2012 265  150 - 400 K/uL Final  . Sodium 11/07/2012 138  135 - 145 mEq/L Final  . Potassium 11/07/2012 3.0* 3.5 - 5.1 mEq/L Final  . Chloride 11/07/2012 102  96 - 112 mEq/L Final  . CO2 11/07/2012 27  19 - 32 mEq/L Final  . Glucose, Bld 11/07/2012 166* 70 - 99 mg/dL Final  . BUN 95/28/4132 6  6 - 23 mg/dL Final  . Creatinine, Ser 11/07/2012 0.69  0.50 - 1.10 mg/dL Final  . Calcium 44/05/270 9.4  8.4 - 10.5 mg/dL Final  . GFR calc non Af Amer 11/07/2012 84* >90 mL/min Final  . GFR calc Af Amer 11/07/2012 >90  >90 mL/min Final   Comment:                                 The eGFR has been calculated  using the CKD EPI equation.                          This calculation has not been                          validated in all clinical                          situations.                          eGFR's persistently                          <90 mL/min signify                          possible Chronic Kidney Disease.  . WBC 11/08/2012 11.5* 4.0 - 10.5 K/uL Final  . RBC 11/08/2012 3.56* 3.87 - 5.11 MIL/uL Final  . Hemoglobin 11/08/2012 9.6* 12.0 - 15.0 g/dL Final  . HCT 09/60/4540 29.1* 36.0 - 46.0 % Final  . MCV 11/08/2012 81.7  78.0 - 100.0 fL Final  . MCH 11/08/2012 27.0  26.0 - 34.0 pg Final  . MCHC 11/08/2012 33.0  30.0 - 36.0 g/dL Final  . RDW 98/03/9146  13.5  11.5 - 15.5 % Final  . Platelets 11/08/2012 224  150 - 400 K/uL Final  . Sodium 11/08/2012 136  135 - 145 mEq/L Final  . Potassium 11/08/2012 3.5  3.5 - 5.1 mEq/L Final  . Chloride 11/08/2012 104  96 - 112 mEq/L Final  . CO2 11/08/2012 26  19 - 32 mEq/L Final  . Glucose, Bld 11/08/2012 132* 70 - 99 mg/dL Final  . BUN 82/95/6213 10  6 - 23 mg/dL Final  . Creatinine, Ser 11/08/2012 0.79  0.50 - 1.10 mg/dL Final  . Calcium 08/65/7846 9.7  8.4 - 10.5 mg/dL Final  . GFR calc non Af Amer 11/08/2012 81* >90 mL/min Final  . GFR calc Af Amer 11/08/2012 >90  >90 mL/min Final   Comment:                                 The eGFR has been calculated                          using the CKD EPI equation.                          This calculation has not been                          validated in all clinical                          situations.                          eGFR's persistently                          <90 mL/min signify  possible Chronic Kidney Disease.  Hospital Outpatient Visit on 10/29/2012  Component Date Value Range Status  . MRSA, PCR 10/29/2012 POSITIVE* NEGATIVE Final   Comment: RESULT CALLED TO, READ BACK BY AND VERIFIED WITH:                          [id number]  . Staphylococcus aureus 10/29/2012 POSITIVE* NEGATIVE Final   Comment:                                 The Xpert SA Assay (FDA                          approved for NASAL specimens                          in patients over 92 years of age),                          is one component of                          a comprehensive surveillance                          program.  Test performance has                          been validated by [employer] for patients greater                          than or equal to 20 year old.                          It is not intended                          to diagnose infection nor to                           guide or monitor treatment.                          RESULT CALLED TO, READ BACK BY AND VERIFIED WITH:                          [id number]  . aPTT 10/29/2012 29  24 - 37 seconds Final  . WBC 10/29/2012 6.5  4.0 - 10.5 K/uL Final  . RBC 10/29/2012 4.40  3.87 - 5.11 MIL/uL Final  . Hemoglobin 10/29/2012 11.8* 12.0 - 15.0 g/dL Final  . HCT 40/98/1191 36.5  36.0 - 46.0 % Final  . MCV 10/29/2012 83.0  78.0 - 100.0 fL Final  . MCH 10/29/2012 26.8  26.0 - 34.0 pg Final  . MCHC 10/29/2012 32.3  30.0 - 36.0 g/dL Final  . RDW 47/82/9562 13.3  11.5 - 15.5 % Final  . Platelets 10/29/2012 331  150 - 400 K/uL Final  . Sodium 10/29/2012 137  135 - 145 mEq/L Final  . Potassium 10/29/2012 4.0  3.5 - 5.1 mEq/L Final  . Chloride 10/29/2012 102  96 - 112 mEq/L Final  . CO2 10/29/2012 28  19 - 32 mEq/L Final  . Glucose, Bld 10/29/2012 91  70 - 99 mg/dL Final  . BUN 40/98/1191 11  6 - 23 mg/dL Final  . Creatinine, Ser 10/29/2012 0.78  0.50 - 1.10 mg/dL Final  . Calcium 47/82/9562 10.3  8.4 - 10.5 mg/dL Final  . Total Protein 10/29/2012 8.0  6.0 - 8.3 g/dL Final  . Albumin 13/11/6576 3.8  3.5 - 5.2 g/dL Final  . AST 46/96/2952 17  0 - 37 U/L Final  . ALT 10/29/2012 10  0 - 35 U/L Final  . Alkaline Phosphatase 10/29/2012 118* 39 - 117 U/L Final  . Total Bilirubin 10/29/2012 0.1* 0.3 - 1.2 mg/dL Final  . GFR calc non Af Amer 10/29/2012 81* >90 mL/min Final  . GFR calc Af Amer 10/29/2012 >90  >90 mL/min Final   Comment:                                 The eGFR has been calculated                          using the CKD EPI equation.                          This calculation has not been                          validated in all clinical                          situations.                          eGFR's persistently                          <90 mL/min signify                          possible Chronic Kidney Disease.  Marland Kitchen Prothrombin Time 10/29/2012 12.4  11.6 - 15.2 seconds Final  . INR  10/29/2012 0.94  0.00 - 1.49 Final  . ABO/RH(D) 10/29/2012 O POS   Final  . Antibody Screen 10/29/2012 NEG   Final  . Sample Expiration 10/29/2012 11/09/2012   Final  . Color, Urine 10/29/2012 YELLOW  YELLOW Final  . APPearance 10/29/2012 CLEAR  CLEAR Final  . Specific Gravity, Urine 10/29/2012 1.010  1.005 - 1.030 Final  . pH 10/29/2012 6.5  5.0 - 8.0 Final  . Glucose, UA 10/29/2012 NEGATIVE  NEGATIVE mg/dL Final  . Hgb urine dipstick 10/29/2012 NEGATIVE  NEGATIVE Final  . Bilirubin Urine 10/29/2012 NEGATIVE  NEGATIVE Final  . Ketones, ur 10/29/2012 NEGATIVE  NEGATIVE mg/dL Final  . Protein, ur 84/13/2440 NEGATIVE  NEGATIVE mg/dL Final  . Urobilinogen, UA 10/29/2012 0.2  0.0 - 1.0 mg/dL Final  . Nitrite 02/25/2535 NEGATIVE  NEGATIVE Final  . Leukocytes, UA 10/29/2012 NEGATIVE  NEGATIVE Final   MICROSCOPIC NOT DONE ON URINES WITH  NEGATIVE PROTEIN, BLOOD, LEUKOCYTES, NITRITE, OR GLUCOSE <1000 mg/dL.  . ABO/RH(D) 10/29/2012 O POS   Final     X-Rays:Dg Chest 2 View  10/29/2012   *RADIOLOGY REPORT*  Clinical Data: Preoperative evaluation for right total hip arthroplasty.  No current chest complaints.  Nonsmoker.  Controlled hypertension  CHEST - 2 VIEW  Comparison: 01/04/2010  Findings: Heart size is stable.  Ectasia of the thoracic aorta is identified and is unchanged in appearance  Prominence of the interstitial markings are noted diffusely throughout both lung fields compatible with underlying bronchitic change and this finding is stable.  No new focal infiltrates or signs of congestive failure are seen.  No pleural fluid is noted.  Multiple healed right-sided rib fractures are identified as well as stable degenerative change of both humeral heads. Moderate degenerative change of the mid and lower thoracic spine is noted  IMPRESSION: Stable cardiopulmonary appearance with no new focal or acute abnormality identified.   Original Report Authenticated By: Rhodia Albright, M.D.   Dg Hip Complete  Right  10/29/2012   *RADIOLOGY REPORT*  Clinical Data: Preoperative images for right hip replacement  RIGHT HIP - COMPLETE 2+ VIEW  Comparison: None.  Findings: The pelvic bones are intact.  There is significant irregularity of left femoral head contour with joint with sclerosis, narrowing, and osteophyte formation.  On the right, there is even more severe joint narrowing.  There is mild irregularity of the femoral head on the right as well with sclerosis of both sides of the joint and osteophyte formation. Inward convexity of both acetabulum suggests an element of protrusio bilaterally.  There is bilateral sacral iliac joint sclerosis worse on the right.  There is mild pubic symphysis situs.  IMPRESSION: Severe bilateral hip arthritis, with an element of bilateral protrusio and bilateral femoral head contour irregularity. Avascular necrosis is not excluded, particularly on the left.   Original Report Authenticated By: Esperanza Heir, M.D.   Dg Pelvis Portable  11/06/2012   *RADIOLOGY REPORT*  Clinical Data: Postop right hip  PORTABLE PELVIS  Comparison: 10/29/2012.  Findings: Right total hip replacement appears in satisfactory position on this single projection. Protrusio right acetabulum noted on the preoperative exam with marked bony thinning.  Prominent left hip joint degenerative changes.  Suggestion of prior pubic rami fracture.  Surgical drain is in place.  IMPRESSION: Total right hip replacement appears in satisfactory position on this single projection.  Please see above.   Original Report Authenticated By: Lacy Duverney, M.D.    EKG: Orders placed in visit on 10/23/12  . EKG 12-LEAD     Hospital Course: Patient was admitted to [facility] and taken to the OR and underwent the above state procedure without complications.  Patient tolerated the procedure well and was later transferred to the recovery room and then to the orthopaedic floor for postoperative care.  They were given PO and IV  analgesics for pain control following their surgery.  They were given 24 hours of postoperative antibiotics of  Anti-infectives   Start     Dose/Rate Route Frequency Ordered Stop   11/07/12 0200  vancomycin (VANCOCIN) IVPB 1000 mg/200 mL premix     1,000 mg 200 mL/hr over 60 Minutes Intravenous Every 12 hours 11/06/12 1802 11/07/12 0358   11/06/12 1230  vancomycin (VANCOCIN) IVPB 1000 mg/200 mL premix     1,000 mg 200 mL/hr over 60 Minutes Intravenous  Once 11/06/12 1227 11/06/12 1340   11/06/12 1115  ceFAZolin (ANCEF) IVPB 2 g/50  mL premix     2 g 100 mL/hr over 30 Minutes Intravenous On call to O.R. 11/06/12 1113 11/06/12 1443     and started on DVT prophylaxis in the form of Xarelto.   PT and OT were ordered for total hip protocol.  The patient was allowed to be WBAT with therapy. Discharge planning was consulted to help with postop disposition and equipment needs.  Patient had a decent night on the evening of surgery.  They started to get up OOB with therapy on day one.  Hemovac drain was pulled without difficulty.  The knee immobilizer was removed and discontinued.  Continued to work with therapy into day two.  Dressing was changed on day two and the incision was healing well. Patient was seen in rounds on POD 2 and it was felt that she was doing well and would be ready the following day for SNF.  She will be evaluated by the weekend coverage staff and if doing well, then transfer over to the SNF.   Discharge Medications: Prior to Admission medications   Medication Sig Start Date End Date Taking? Authorizing Provider  ALPRAZolam (XANAX) 0.25 MG tablet Take 1 tablet (0.25 mg total) by mouth 2 (two) times daily as needed. 10/23/12  Yes Corwin Levins, MD  amLODipine (NORVASC) 10 MG tablet Take 10 mg by mouth every morning.   Yes Historical Provider, MD  benazepril (LOTENSIN) 40 MG tablet Take 40 mg by mouth every morning.   Yes Historical Provider, MD  cloNIDine (CATAPRES) 0.3 MG tablet Take  0.3 mg by mouth 2 (two) times daily.   Yes Historical Provider, MD  docusate sodium (COLACE) 100 MG capsule Take 100 mg by mouth every morning.   Yes Historical Provider, MD  ferrous sulfate 325 (65 FE) MG tablet Take 325 mg by mouth daily with breakfast.   Yes Historical Provider, MD  gabapentin (NEURONTIN) 300 MG capsule Take 300 mg by mouth 3 (three) times daily.   Yes Historical Provider, MD  lovastatin (MEVACOR) 40 MG tablet Take 40 mg by mouth at bedtime.   Yes Historical Provider, MD  nystatin (MYCOSTATIN) 100000 UNIT/ML suspension Take 500,000 Units by mouth 3 (three) times daily. Swish and spit   Yes Historical Provider, MD  oxybutynin (DITROPAN) 5 MG tablet Take 5 mg by mouth 3 (three) times daily.   Yes Historical Provider, MD  Propylene Glycol (SYSTANE BALANCE) 0.6 % SOLN Place 1 drop into both eyes 3 (three) times daily.   Yes Historical Provider, MD  traZODone (DESYREL) 50 MG tablet Take 50 mg by mouth at bedtime. Take 0.5-1 tablets (25-50 mg total) by mouth at bedtime as needed for sleep.   Yes Historical Provider, MD  bisacodyl (DULCOLAX) 10 MG suppository Place 1 suppository (10 mg total) rectally daily as needed. 11/08/12   Anani Gu Julien Girt, PA-C  diphenhydrAMINE (BENADRYL) 12.5 MG/5ML elixir Take 5-10 mLs (12.5-25 mg total) by mouth every 4 (four) hours as needed for itching. 11/08/12   Jamarie Mussa, PA-C  furosemide (LASIX) 20 MG tablet Take 20 mg by mouth every morning.    Historical Provider, MD  methocarbamol (ROBAXIN) 500 MG tablet Take 1 tablet (500 mg total) by mouth every 6 (six) hours as needed. 11/08/12   Shaquila Sigman Julien Girt, PA-C  metoCLOPramide (REGLAN) 5 MG tablet Take 1-2 tablets (5-10 mg total) by mouth every 8 (eight) hours as needed (if ondansetron (ZOFRAN) ineffective.). 11/08/12   Raihana Balderrama, PA-C  ondansetron (ZOFRAN) 4 MG tablet Take 1 tablet (4  mg total) by mouth every 6 (six) hours as needed for nausea. 11/08/12   Linkon Siverson, PA-C    oxyCODONE (OXY IR/ROXICODONE) 5 MG immediate release tablet Take 1-2 tablets (5-10 mg total) by mouth every 3 (three) hours as needed. 11/08/12   Croix Presley, PA-C  polyethylene glycol (MIRALAX / GLYCOLAX) packet Take 17 g by mouth daily as needed. 11/08/12   Genice Kimberlin Julien Girt, PA-C  rivaroxaban (XARELTO) 10 MG TABS tablet Take 1 tablet (10 mg total) by mouth daily with breakfast. Take Xarelto for two and a half more weeks, then discontinue Xarelto. Once the patient has completed the Xarelto, they may resume the 325 mg Aspirin. 11/08/12   Yolander Goodie, PA-C  traMADol (ULTRAM) 50 MG tablet Take 1-2 tablets (50-100 mg total) by mouth every 6 (six) hours as needed (mild pain). 11/08/12   Samina Weekes Julien Girt, PA-C    Diet: Cardiac diet Activity:WBAT No bending hip over 90 degrees- A "L" Angle Do not cross legs Do not let foot roll inward When turning these patients a pillow should be placed between the patient's legs to prevent crossing. Patients should have the affected knee fully extended when trying to sit or stand from all surfaces to prevent excessive hip flexion. When ambulating and turning toward the affected side the affected leg should have the toes turned out prior to moving the walker and the rest of patient's body as to prevent internal rotation/ turning in of the leg. Abduction pillows are the most effective way to prevent a patient from not crossing legs or turning toes in at rest. If an abduction pillow is not ordered placing a regular pillow length wise between the patient's legs is also an effective reminder. It is imperative that these precautions be maintained so that the surgical hip does not dislocate. Follow-up:in 2 weeks Disposition - Skilled nursing facility Discharged Condition: Pending at time of summary, Transfer tomorrow Saturday 11/09/2012 if doing well   Discharge Orders   Future Orders Complete By Expires     Call MD / Call 911  As directed      Comments:      If you experience chest pain or shortness of breath, CALL 911 and be transported to the hospital emergency room.  If you develope a fever above 101 F, pus (white drainage) or increased drainage or redness at the wound, or calf pain, call your surgeon's office.    Change dressing  As directed     Comments:      You may change your dressing dressing daily with sterile 4 x 4 inch gauze dressing and paper tape.  Do not submerge the incision under water.    Constipation Prevention  As directed     Comments:      Drink plenty of fluids.  Prune juice may be helpful.  You may use a stool softener, such as Colace (over the counter) 100 mg twice a day.  Use MiraLax (over the counter) for constipation as needed.    Diet - low sodium heart healthy  As directed     Discharge instructions  As directed     Comments:      Pick up stool softner and laxative for home. Do not submerge incision under water. May shower. Continue to use ice for pain and swelling from surgery. Hip precautions.  Total Hip Protocol.  Take Xarelto for two and a half more weeks, then discontinue Xarelto. Once the patient has completed the Xarelto, they may resume the 325 mg  Aspirin.  When discharged from the skilled rehab facility, please have the facility set up the patient's Home Health Physical Therapy prior to being released.  Also provide the patient with their medications at time of release from the facility to include their pain medication, the muscle relaxants, and their blood thinner medication.  If the patient is still at the rehab facility at time of follow up appointment, please also assist the patient in arranging follow up appointment in our office and any transportation needs.    Do not sit on low chairs, stoools or toilet seats, as it may be difficult to get up from low surfaces  As directed     Driving restrictions  As directed     Comments:      No driving until released by the physician.    Follow  the hip precautions as taught in Physical Therapy  As directed     Increase activity slowly as tolerated  As directed     Lifting restrictions  As directed     Comments:      No lifting until released by the physician.    Patient may shower  As directed     Comments:      You may shower without a dressing once there is no drainage.  Do not wash over the wound.  If drainage remains, do not shower until drainage stops.    TED hose  As directed     Comments:      Use stockings (TED hose) for 3 weeks on both leg(s).  You may remove them at night for sleeping.    Weight bearing as tolerated  As directed         Medication List    STOP taking these medications       aspirin EC 325 MG tablet     Cholecalciferol 1000 UNITS capsule     meloxicam 15 MG tablet  Commonly known as:  MOBIC     oxyCODONE-acetaminophen 10-325 MG per tablet  Commonly known as:  PERCOCET      TAKE these medications       ALPRAZolam 0.25 MG tablet  Commonly known as:  XANAX  Take 1 tablet (0.25 mg total) by mouth 2 (two) times daily as needed.     amLODipine 10 MG tablet  Commonly known as:  NORVASC  Take 10 mg by mouth every morning.     benazepril 40 MG tablet  Commonly known as:  LOTENSIN  Take 40 mg by mouth every morning.     bisacodyl 10 MG suppository  Commonly known as:  DULCOLAX  Place 1 suppository (10 mg total) rectally daily as needed.     cloNIDine 0.3 MG tablet  Commonly known as:  CATAPRES  Take 0.3 mg by mouth 2 (two) times daily.     diphenhydrAMINE 12.5 MG/5ML elixir  Commonly known as:  BENADRYL  Take 5-10 mLs (12.5-25 mg total) by mouth every 4 (four) hours as needed for itching.     docusate sodium 100 MG capsule  Commonly known as:  COLACE  Take 100 mg by mouth every morning.     ferrous sulfate 325 (65 FE) MG tablet  Take 325 mg by mouth daily with breakfast.     furosemide 20 MG tablet  Commonly known as:  LASIX  Take 20 mg by mouth every morning.     gabapentin  300 MG capsule  Commonly known as:  NEURONTIN  Take 300 mg by  mouth 3 (three) times daily.     lovastatin 40 MG tablet  Commonly known as:  MEVACOR  Take 40 mg by mouth at bedtime.     methocarbamol 500 MG tablet  Commonly known as:  ROBAXIN  Take 1 tablet (500 mg total) by mouth every 6 (six) hours as needed.     metoCLOPramide 5 MG tablet  Commonly known as:  REGLAN  Take 1-2 tablets (5-10 mg total) by mouth every 8 (eight) hours as needed (if ondansetron (ZOFRAN) ineffective.).     nystatin 100000 UNIT/ML suspension  Commonly known as:  MYCOSTATIN  Take 500,000 Units by mouth 3 (three) times daily. Swish and spit     ondansetron 4 MG tablet  Commonly known as:  ZOFRAN  Take 1 tablet (4 mg total) by mouth every 6 (six) hours as needed for nausea.     oxybutynin 5 MG tablet  Commonly known as:  DITROPAN  Take 5 mg by mouth 3 (three) times daily.     oxyCODONE 5 MG immediate release tablet  Commonly known as:  Oxy IR/ROXICODONE  Take 1-2 tablets (5-10 mg total) by mouth every 3 (three) hours as needed.     polyethylene glycol packet  Commonly known as:  MIRALAX / GLYCOLAX  Take 17 g by mouth daily as needed.     rivaroxaban 10 MG Tabs tablet  Commonly known as:  XARELTO  - Take 1 tablet (10 mg total) by mouth daily with breakfast. Take Xarelto for two and a half more weeks, then discontinue Xarelto.  - Once the patient has completed the Xarelto, they may resume the 325 mg Aspirin.     SYSTANE BALANCE 0.6 % Soln  Generic drug:  Propylene Glycol  Place 1 drop into both eyes 3 (three) times daily.     traMADol 50 MG tablet  Commonly known as:  ULTRAM  Take 1-2 tablets (50-100 mg total) by mouth every 6 (six) hours as needed (mild pain).     traZODone 50 MG tablet  Commonly known as:  DESYREL  Take 50 mg by mouth at bedtime. Take 0.5-1 tablets (25-50 mg total) by mouth at bedtime as needed for sleep.           Follow-up Information   Follow up with Loanne Drilling, MD. Schedule an appointment as soon as possible for a visit in 2 weeks.   Contact information:   [address] [place] [address] [phone]       Signed: Patrica Duel 11/08/2012, 10:01 AM

## 2012-11-09 LAB — CBC
HCT: 28.9 % — ABNORMAL LOW (ref 36.0–46.0)
MCH: 27.4 pg (ref 26.0–34.0)
MCV: 82.6 fL (ref 78.0–100.0)
RBC: 3.5 MIL/uL — ABNORMAL LOW (ref 3.87–5.11)
WBC: 9.8 10*3/uL (ref 4.0–10.5)

## 2012-11-09 NOTE — Progress Notes (Signed)
Report called to Huntley Dec, Charity fundraiser at [facility].

## 2012-11-09 NOTE — Progress Notes (Signed)
   Subjective: 3 Days Post-Op Procedure(s) (LRB): RIGHT TOTAL HIP ARTHROPLASTY WITH ACETABULAR AUTOGRAFT (Right) Patient reports pain as mild.   Plan is to go Skilled nursing facility after hospital stay.  Objective: Vital signs in last 24 hours: Temp:  [97.4 F (36.3 C)-99 F (37.2 C)] 99 F (37.2 C) (07/12 0450) Pulse Rate:  [74-76] 74 (07/12 0450) Resp:  [15-16] 16 (07/12 0450) BP: (132-148)/(68-78) 132/68 mmHg (07/12 0450) SpO2:  [93 %-96 %] 96 % (07/12 0450)  Intake/Output from previous day:  Intake/Output Summary (Last 24 hours) at 11/09/12 0815 Last data filed at 11/09/12 0445  Gross per 24 hour  Intake    720 ml  Output    800 ml  Net    -80 ml    Intake/Output this shift:    Labs:  Recent Labs  11/07/12 0356 11/08/12 0353 11/09/12 0505  HGB 10.3* 9.6* 9.6*    Recent Labs  11/08/12 0353 11/09/12 0505  WBC 11.5* 9.8  RBC 3.56* 3.50*  HCT 29.1* 28.9*  PLT 224 239    Recent Labs  11/07/12 0356 11/08/12 0353  NA 138 136  K 3.0* 3.5  CL 102 104  CO2 27 26  BUN 6 10  CREATININE 0.69 0.79  GLUCOSE 166* 132*  CALCIUM 9.4 9.7   No results found for this basename: LABPT, INR,  in the last 72 hours  EXAM General - Patient is Alert, Appropriate and Oriented Extremity - Neurologically intact Neurovascular intact No cellulitis present Compartment soft Dressing/Incision - clean, dry, no drainage Motor Function - intact, moving foot and toes well on exam.   Past Medical History  Diagnosis Date  . HYPERLIPIDEMIA 04/18/2007  . Morbid obesity 12/09/2006  . ANEMIA 01/14/2009  . Chronic pain syndrome 02/16/2009  . HYPERTENSION 12/09/2006  . ACUTE URIS OF UNSPECIFIED SITE 10/22/2007  . ALLERGIC RHINITIS 12/09/2006  . ASTHMA 12/09/2006  . COPD 12/09/2006  . Glossitis 01/14/2009  . GERD 12/09/2006  . HIATAL HERNIA 03/01/2010  . OVERACTIVE BLADDER 04/14/2008  . DEGENERATIVE JOINT DISEASE 12/09/2006  . LOW BACK PAIN 12/09/2006  . OSTEOPOROSIS 12/09/2006  .  SYNCOPE 11/05/2008  . DIZZINESS 03/30/2010  . INSOMNIA-SLEEP DISORDER-UNSPEC 11/05/2008  . HYPERSOMNIA 08/31/2008  . PALPITATIONS, RECURRENT 01/27/2010  . DYSPNEA 01/27/2010  . Other dysphagia 01/14/2009  . FREQUENCY, URINARY 03/30/2010  . Abdominal pain, right upper quadrant 11/28/2007  . EPIGASTRIC PAIN 01/14/2009  . Abdominal  pain, other specified site 11/28/2007  . Nonspecific (abnormal) findings on radiological and other examination of body structure 11/05/2008  . TRANSIENT ISCHEMIC ATTACK, HX OF 12/09/2006  . HELICOBACTER PYLORI GASTRITIS, HX OF 12/09/2006  . ABNORMAL CHEST XRAY 11/05/2008  . Gait disorder 09/04/2011    Assessment/Plan: 3 Days Post-Op Procedure(s) (LRB): RIGHT TOTAL HIP ARTHROPLASTY WITH ACETABULAR AUTOGRAFT (Right) Principal Problem:   OA (osteoarthritis) of hip Active Problems:   Hypokalemia   Discharge to SNF  DVT Prophylaxis - Xarelto Weight-Bearing as tolerated to right leg  Wendy Torres V 11/09/2012, 8:15 AM

## 2012-11-09 NOTE — Progress Notes (Addendum)
Per MD, Pt ready for d/c.  Notified RN, Pt, and facility.  Pt to notify daughter; daughter to provide transportation.  Admission arranged by Weekday CSW.  Facility ready to receive Pt.  Providence Crosby, LCSWA Clinical Social Work [phone]

## 2012-11-09 NOTE — Progress Notes (Signed)
Pt stable all paperwork given to daughter and pt transported to [facility] via her daughters private vehicle.  Pt transported via wheelchair to private vehicle with NT.

## 2012-11-09 NOTE — Progress Notes (Addendum)
Physical Therapy Treatment Patient Details Name: ALMAS RAKE MRN: [id number] DOB: 09-29-1940 Today's Date: 11/09/2012 Time: 4098-1191 PT Time Calculation (min): 32 min  PT Assessment / Plan / Recommendation  PT Comments   Reviewed car transfers with pt.  Follow Up Recommendations  SNF     Does the patient have the potential to tolerate intense rehabilitation     Barriers to Discharge        Equipment Recommendations  None recommended by PT    Recommendations for Other Services OT consult  Frequency 7X/week   Progress towards PT Goals Progress towards PT goals: Progressing toward goals  Plan Current plan remains appropriate    Precautions / Restrictions Precautions Precautions: Posterior Hip;Fall Precaution Comments: pt recalled 2/3 THP - all precautions reviewed Restrictions Weight Bearing Restrictions: No RLE Weight Bearing: Weight bearing as tolerated Other Position/Activity Restrictions: WBAT   Pertinent Vitals/Pain 3/10; ice pack provided    Mobility  Bed Mobility Bed Mobility: Supine to Sit Supine to Sit: 4: Min guard;HOB elevated Details for Bed Mobility Assistance: cues for sequence, use of L LE to self assist and adference to THP Transfers Transfers: Sit to Stand;Stand to Sit Sit to Stand: 4: Min assist;With upper extremity assist;From chair/3-in-1;From bed Stand to Sit: 4: Min assist;With upper extremity assist;To chair/3-in-1;With armrests Details for Transfer Assistance: verbal cues for hand placement and R LE management Ambulation/Gait Ambulation/Gait Assistance: 4: Min assist Ambulation Distance (Feet): 20 Feet (twice) Assistive device: Rolling walker Ambulation/Gait Assistance Details: cues for posture, sequence, position from RW and saftey with turns    Exercises Total Joint Exercises Ankle Circles/Pumps: AROM;Both;10 reps;Supine Quad Sets: AROM;Both;10 reps;Supine Gluteal Sets: AROM;10 reps;Both;Supine Heel Slides: AAROM;Supine;Right;20  reps Hip ABduction/ADduction: AROM;Supine;Right;20 reps   PT Diagnosis:    PT Problem List:   PT Treatment Interventions:     PT Goals (current goals can now be found in the care plan section) Acute Rehab PT Goals Patient Stated Goal: Rehab and home to resume previous lifestyle with decreased pain PT Goal Formulation: With patient Potential to Achieve Goals: Good  Visit Information  Last PT Received On: 11/09/12 Assistance Needed: +1    Subjective Data  Patient Stated Goal: Rehab and home to resume previous lifestyle with decreased pain   Cognition  Cognition Arousal/Alertness: Awake/alert Behavior During Therapy: WFL for tasks assessed/performed Overall Cognitive Status: Within Functional Limits for tasks assessed    Balance     End of Session PT - End of Session Equipment Utilized During Treatment: Gait belt Activity Tolerance: Patient tolerated treatment well;Patient limited by fatigue Patient left: in chair;with call bell/phone within reach Nurse Communication: Mobility status   GP     Elin Seats 11/09/2012, 8:35 AM

## 2012-11-10 ENCOUNTER — Emergency Department (HOSPITAL_COMMUNITY)
Admission: EM | Admit: 2012-11-10 | Discharge: 2012-11-10 | Disposition: A | Discharge status: Home / Self Care | Payer: Medicare Other | Attending: Emergency Medicine | Admitting: Emergency Medicine

## 2012-11-10 ENCOUNTER — Encounter (HOSPITAL_COMMUNITY): Payer: Self-pay

## 2012-11-10 ENCOUNTER — Emergency Department (HOSPITAL_COMMUNITY): Payer: Medicare Other

## 2012-11-10 DIAGNOSIS — Z8709 Personal history of other diseases of the respiratory system: Secondary | ICD-10-CM | POA: Insufficient documentation

## 2012-11-10 DIAGNOSIS — I10 Essential (primary) hypertension: Secondary | ICD-10-CM | POA: Insufficient documentation

## 2012-11-10 DIAGNOSIS — Z8739 Personal history of other diseases of the musculoskeletal system and connective tissue: Secondary | ICD-10-CM | POA: Insufficient documentation

## 2012-11-10 DIAGNOSIS — Z87891 Personal history of nicotine dependence: Secondary | ICD-10-CM | POA: Insufficient documentation

## 2012-11-10 DIAGNOSIS — Z8719 Personal history of other diseases of the digestive system: Secondary | ICD-10-CM | POA: Insufficient documentation

## 2012-11-10 DIAGNOSIS — M79609 Pain in unspecified limb: Secondary | ICD-10-CM

## 2012-11-10 DIAGNOSIS — M7989 Other specified soft tissue disorders: Secondary | ICD-10-CM

## 2012-11-10 DIAGNOSIS — Z79899 Other long term (current) drug therapy: Secondary | ICD-10-CM | POA: Insufficient documentation

## 2012-11-10 DIAGNOSIS — R197 Diarrhea, unspecified: Secondary | ICD-10-CM | POA: Insufficient documentation

## 2012-11-10 DIAGNOSIS — Z862 Personal history of diseases of the blood and blood-forming organs and certain disorders involving the immune mechanism: Secondary | ICD-10-CM | POA: Insufficient documentation

## 2012-11-10 DIAGNOSIS — R509 Fever, unspecified: Secondary | ICD-10-CM | POA: Insufficient documentation

## 2012-11-10 DIAGNOSIS — J45901 Unspecified asthma with (acute) exacerbation: Secondary | ICD-10-CM | POA: Insufficient documentation

## 2012-11-10 DIAGNOSIS — E785 Hyperlipidemia, unspecified: Secondary | ICD-10-CM | POA: Insufficient documentation

## 2012-11-10 DIAGNOSIS — L02419 Cutaneous abscess of limb, unspecified: Secondary | ICD-10-CM | POA: Insufficient documentation

## 2012-11-10 DIAGNOSIS — J441 Chronic obstructive pulmonary disease with (acute) exacerbation: Secondary | ICD-10-CM | POA: Insufficient documentation

## 2012-11-10 DIAGNOSIS — Z8673 Personal history of transient ischemic attack (TIA), and cerebral infarction without residual deficits: Secondary | ICD-10-CM | POA: Insufficient documentation

## 2012-11-10 DIAGNOSIS — L0390 Cellulitis, unspecified: Secondary | ICD-10-CM

## 2012-11-10 LAB — CBC WITH DIFFERENTIAL/PLATELET
Eosinophils Absolute: 0.2 10*3/uL (ref 0.0–0.7)
Eosinophils Relative: 1 % (ref 0–5)
HCT: 29.8 % — ABNORMAL LOW (ref 36.0–46.0)
Lymphocytes Relative: 29 % (ref 12–46)
Lymphs Abs: 3.2 10*3/uL (ref 0.7–4.0)
MCH: 27.1 pg (ref 26.0–34.0)
MCV: 82.5 fL (ref 78.0–100.0)
Monocytes Absolute: 1.5 10*3/uL — ABNORMAL HIGH (ref 0.1–1.0)
Monocytes Relative: 14 % — ABNORMAL HIGH (ref 3–12)
RBC: 3.61 MIL/uL — ABNORMAL LOW (ref 3.87–5.11)
WBC: 11.1 10*3/uL — ABNORMAL HIGH (ref 4.0–10.5)

## 2012-11-10 LAB — URINALYSIS, ROUTINE W REFLEX MICROSCOPIC
Bilirubin Urine: NEGATIVE
Glucose, UA: NEGATIVE mg/dL
Hgb urine dipstick: NEGATIVE
Ketones, ur: NEGATIVE mg/dL
Leukocytes, UA: NEGATIVE
pH: 6 (ref 5.0–8.0)

## 2012-11-10 LAB — BASIC METABOLIC PANEL
BUN: 18 mg/dL (ref 6–23)
CO2: 27 mEq/L (ref 19–32)
Calcium: 10 mg/dL (ref 8.4–10.5)
Creatinine, Ser: 0.94 mg/dL (ref 0.50–1.10)
Glucose, Bld: 106 mg/dL — ABNORMAL HIGH (ref 70–99)

## 2012-11-10 MED ORDER — CEPHALEXIN 500 MG PO CAPS
500.0000 mg | ORAL_CAPSULE | Freq: Once | ORAL | Status: AC
  Administered 2012-11-10: 500 mg via ORAL
  Filled 2012-11-10: qty 1

## 2012-11-10 MED ORDER — MORPHINE SULFATE 4 MG/ML IJ SOLN: 4.0000 mg | Freq: Once | INTRAMUSCULAR | Status: DC

## 2012-11-10 MED ORDER — CLINDAMYCIN HCL 300 MG PO CAPS
300.0000 mg | ORAL_CAPSULE | Freq: Once | ORAL | Status: DC
  Filled 2012-11-10: qty 1

## 2012-11-10 MED ORDER — OXYCODONE-ACETAMINOPHEN 5-325 MG PO TABS
1.0000 | ORAL_TABLET | Freq: Once | ORAL | Status: AC
  Administered 2012-11-10: 1 via ORAL
  Filled 2012-11-10: qty 1

## 2012-11-10 MED ORDER — CEPHALEXIN 500 MG PO CAPS: 500.0000 mg | ORAL_CAPSULE | Freq: Four times a day (QID) | ORAL | Status: DC

## 2012-11-10 NOTE — ED Notes (Signed)
Per EMS patient had right hip replacement on Wednesday 11/06/2012. At 6 am she woke up with leg swelling. Temp up to 100.2 at [facility]. EMS 99.1 temp. No rash noted, strong pulse noted. Alert and oriented X4 136/69,116, 18, 96% 4liters. Pain 6/10 but that is her norm since the surgery. cbg 89

## 2012-11-10 NOTE — ED Provider Notes (Signed)
History    CSN: [id number] Arrival date & time 11/10/12  1601  First MD Initiated Contact with Patient 11/10/12 1613     Chief Complaint  Patient presents with  . Leg Swelling   (Consider location/radiation/quality/duration/timing/severity/associated sxs/prior Treatment) The history is provided by the patient, the EMS personnel and the nursing home.  Wendy Torres is a 72 y.o. female hx of HL, osteoporosis s/p recent R hip replacement here with L leg swelling and fever. She is diarrhea have and had a low-grade temperature 100.2 today. She was also noted to have right leg swelling that is getting worse and some redness around the wound. Sent here for evaluation. Denies purulent drainage, pain at baseline. Not on coumadin postop.   Past Medical History  Diagnosis Date  . HYPERLIPIDEMIA 04/18/2007  . Morbid obesity 12/09/2006  . ANEMIA 01/14/2009  . Chronic pain syndrome 02/16/2009  . HYPERTENSION 12/09/2006  . ACUTE URIS OF UNSPECIFIED SITE 10/22/2007  . ALLERGIC RHINITIS 12/09/2006  . ASTHMA 12/09/2006  . COPD 12/09/2006  . Glossitis 01/14/2009  . GERD 12/09/2006  . HIATAL HERNIA 03/01/2010  . OVERACTIVE BLADDER 04/14/2008  . DEGENERATIVE JOINT DISEASE 12/09/2006  . LOW BACK PAIN 12/09/2006  . OSTEOPOROSIS 12/09/2006  . SYNCOPE 11/05/2008  . DIZZINESS 03/30/2010  . INSOMNIA-SLEEP DISORDER-UNSPEC 11/05/2008  . HYPERSOMNIA 08/31/2008  . PALPITATIONS, RECURRENT 01/27/2010  . DYSPNEA 01/27/2010  . Other dysphagia 01/14/2009  . FREQUENCY, URINARY 03/30/2010  . Abdominal pain, right upper quadrant 11/28/2007  . EPIGASTRIC PAIN 01/14/2009  . Abdominal  pain, other specified site 11/28/2007  . Nonspecific (abnormal) findings on radiological and other examination of body structure 11/05/2008  . TRANSIENT ISCHEMIC ATTACK, HX OF 12/09/2006  . HELICOBACTER PYLORI GASTRITIS, HX OF 12/09/2006  . ABNORMAL CHEST XRAY 11/05/2008  . Gait disorder 09/04/2011   Past Surgical History  Procedure Laterality Date  .  Tubal ligation    . Lumbar disc surgery    . Colonoscopy    . Tonsillectomy      as a child  . Total hip arthroplasty Right 11/06/2012    Procedure: RIGHT TOTAL HIP ARTHROPLASTY WITH ACETABULAR AUTOGRAFT;  Surgeon: Loanne Drilling, MD;  Location: [facility];  Service: Orthopedics;  Laterality: Right;   Family History  Problem Relation Age of Onset  . Heart disease Mother     Had pacemaker  . Cancer Father     Lung  . Hypertension Brother    History  Substance Use Topics  . Smoking status: Former Smoker -- 0.50 packs/day for 25 years    Types: Cigarettes    Quit date: 10/29/1972  . Smokeless tobacco: Not on file  . Alcohol Use: No   OB History   Grav Para Term Preterm Abortions TAB SAB Ect Mult Living                 Review of Systems  Constitutional: Positive for fever.  Musculoskeletal:       R leg swelling   All other systems reviewed and are negative.    Allergies  Review of patient's allergies indicates no known allergies.  Home Medications   Current Outpatient Rx  Name  Route  Sig  Dispense  Refill  . ALPRAZolam (XANAX) 0.25 MG tablet   Oral   Take 1 tablet (0.25 mg total) by mouth 2 (two) times daily as needed.   60 tablet   3   . amLODipine (NORVASC) 10 MG tablet   Oral   Take 10  mg by mouth every morning.         . benazepril (LOTENSIN) 40 MG tablet   Oral   Take 40 mg by mouth every morning.         . bisacodyl (DULCOLAX) 10 MG suppository   Rectal   Place 1 suppository (10 mg total) rectally daily as needed.   12 suppository   0   . cloNIDine (CATAPRES) 0.3 MG tablet   Oral   Take 0.3 mg by mouth 2 (two) times daily.         . diphenhydrAMINE (BENADRYL) 12.5 MG/5ML elixir   Oral   Take 5-10 mLs (12.5-25 mg total) by mouth every 4 (four) hours as needed for itching.   120 mL   0   . docusate sodium (COLACE) 100 MG capsule   Oral   Take 100 mg by mouth every morning.         . furosemide (LASIX) 20 MG tablet   Oral   Take 20 mg  by mouth every morning.         . gabapentin (NEURONTIN) 300 MG capsule   Oral   Take 300 mg by mouth 3 (three) times daily.         Marland Kitchen lovastatin (MEVACOR) 40 MG tablet   Oral   Take 40 mg by mouth at bedtime.         . methocarbamol (ROBAXIN) 500 MG tablet   Oral   Take 1 tablet (500 mg total) by mouth every 6 (six) hours as needed.   80 tablet   0   . metoCLOPramide (REGLAN) 5 MG tablet   Oral   Take 1-2 tablets (5-10 mg total) by mouth every 8 (eight) hours as needed (if ondansetron (ZOFRAN) ineffective.).   40 tablet   0   . nystatin (MYCOSTATIN) 100000 UNIT/ML suspension   Oral   Take 500,000 Units by mouth 3 (three) times daily. Swish and spit         . ondansetron (ZOFRAN) 4 MG tablet   Oral   Take 1 tablet (4 mg total) by mouth every 6 (six) hours as needed for nausea.   40 tablet   0   . oxybutynin (DITROPAN) 5 MG tablet   Oral   Take 5 mg by mouth 3 (three) times daily.         Marland Kitchen oxyCODONE (OXY IR/ROXICODONE) 5 MG immediate release tablet   Oral   Take 1-2 tablets (5-10 mg total) by mouth every 3 (three) hours as needed.   80 tablet   0   . polyethylene glycol (MIRALAX / GLYCOLAX) packet   Oral   Take 17 g by mouth daily as needed.   14 each   0   . Propylene Glycol (SYSTANE BALANCE) 0.6 % SOLN   Both Eyes   Place 1 drop into both eyes 3 (three) times daily.         . rivaroxaban (XARELTO) 10 MG TABS tablet   Oral   Take 1 tablet (10 mg total) by mouth daily with breakfast. Take Xarelto for two and a half more weeks, then discontinue Xarelto. Once the patient has completed the Xarelto, they may resume the 325 mg Aspirin.   18 tablet   0   . traMADol (ULTRAM) 50 MG tablet   Oral   Take 1-2 tablets (50-100 mg total) by mouth every 6 (six) hours as needed (mild pain).   60 tablet   0   .  traZODone (DESYREL) 50 MG tablet   Oral   Take 50 mg by mouth at bedtime. Take 0.5-1 tablets (25-50 mg total) by mouth at bedtime as needed for  sleep.          BP 136/56  Pulse 99  Temp(Src) 99.4 F (37.4 C) (Oral)  Resp 20  SpO2 95% Physical Exam  Nursing note and vitals reviewed. Constitutional: She is oriented to person, place, and time. She appears well-developed and well-nourished.  HENT:  Head: Normocephalic.  Mouth/Throat: Oropharynx is clear and moist.  Eyes: Conjunctivae are normal. Pupils are equal, round, and reactive to light.  Neck: Normal range of motion. Neck supple.  Cardiovascular: Normal rate, regular rhythm and normal heart sounds.   Pulmonary/Chest: Effort normal and breath sounds normal. No respiratory distress. She has no wheezes. She has no rales.  Abdominal: Soft. Bowel sounds are normal. She exhibits no distension. There is no tenderness. There is no rebound and no guarding.  Musculoskeletal: Normal range of motion.  R leg swollen, no calf tenderness. Wound healing well. No purulent drainage, mild edema around the wound. There is mild cellulitis inner part of R thigh.   Neurological: She is alert and oriented to person, place, and time.  Skin: Skin is warm and dry.  Psychiatric: She has a normal mood and affect. Her behavior is normal. Judgment and thought content normal.    ED Course  Procedures (including critical care time) Labs Reviewed  CBC WITH DIFFERENTIAL - Abnormal; Notable for the following:    WBC 11.1 (*)    RBC 3.61 (*)    Hemoglobin 9.8 (*)    HCT 29.8 (*)    Monocytes Relative 14 (*)    Monocytes Absolute 1.5 (*)    All other components within normal limits  BASIC METABOLIC PANEL - Abnormal; Notable for the following:    Glucose, Bld 106 (*)    GFR calc non Af Amer 59 (*)    GFR calc Af Amer 68 (*)    All other components within normal limits  LACTIC ACID, PLASMA  URINALYSIS, ROUTINE W REFLEX MICROSCOPIC   Dg Chest 2 View  11/10/2012   *RADIOLOGY REPORT*  Clinical Data: Fever.  Postop hip surgery 11/06/2012  CHEST - 2 VIEW  Comparison: 10/29/2012  Findings: Cardiac  enlargement without heart failure.  Negative for pneumonia or effusion.  The lungs are clear.  Chronic right rib fractures.  IMPRESSION: No acute cardiopulmonary abnormality.   Original Report Authenticated By: Janeece Riggers, M.D.   Dg Hip Complete Right  11/10/2012   *RADIOLOGY REPORT*  Clinical Data: Status post right hip replacement now with redness, pain and swelling of the right femur.  RIGHT HIP - COMPLETE 2+ VIEW  Comparison: Plain films right hip 10/29/2012 and single view of the pelvis 11/06/2012.  Findings: Right total hip arthroplasty is identified.  The device is located.  There is bilateral acetabular protrusio.  No fracture or dislocation is identified. Erosions in the left femoral head noted.  Soft tissue structures are unremarkable.  IMPRESSION:  1.  No acute finding. 2.  Status post right total hip replacement. 3.  Bilateral acetabular protrusio and erosions in the left femoral head most consistent with rheumatoid arthritis.   Original Report Authenticated By: Holley Dexter, M.D.   Dg Femur Right  11/10/2012   *RADIOLOGY REPORT*  Clinical Data: Right thigh redness pain and swelling.  Status post hip replacement 10/29/2012.  RIGHT FEMUR - 2 VIEW  Comparison: None.  Findings: Right  total hip arthroplasty is in place.  The device is located and no fracture is identified.  No soft tissue gas collection is seen.  No unexpected radiopaque foreign body is identified.  Symmetric joint space narrowing about the knee without osteophytosis is suggestive of rheumatoid arthritis.  IMPRESSION:  1.  Status post right total hip replacement.  No acute finding. 2.  Findings most compatible with rheumatoid arthritis right knee.   Original Report Authenticated By: Holley Dexter, M.D.   No diagnosis found.  MDM  Wendy Torres is a 72 y.o. female here with R leg swelling. Will get labs to r/o infection. Will get [place] to r/o DVT. Will consult ortho.   7:27 PM No DVT on [place]. Hip is in place. Labs showed  mildly elevated WBC 11. CXR nl. I called Dr. Charlann Boxer, covering from Dr. Lequita Halt, who recommend UA, which is normal. He said that its too early to get wound infection. Recommend incentive spirometer at rehab. Given mild cellulitis, will d/c back on course of keflex.    Richardean Canal, MD 11/10/12 [phone]

## 2012-11-10 NOTE — ED Notes (Signed)
Patient has strong pulses in bilateral feet. Right hip is slightly more swollen than left. Intact dressing to right hip without any notable discharge.

## 2012-11-10 NOTE — Progress Notes (Signed)
VASCULAR LAB PRELIMINARY  PRELIMINARY  PRELIMINARY  PRELIMINARY  Right lower extremity venous Doppler completed.    Preliminary report:  There is no DVT or SVT noted in the right lower extremity.  Saylah Ketner, RVT 11/10/2012, 6:17 PM

## 2012-11-10 NOTE — ED Notes (Signed)
Patient with red and slightly blanchable area to right inner thigh. Patient stated that it is spreading down toward her knee.

## 2012-11-10 NOTE — Progress Notes (Signed)
Clinical Social Work Department CLINICAL SOCIAL WORK PLACEMENT NOTE 11/10/2012  Patient:  TERRIA, DESCHEPPER  Account Number:  [id number] Admit date:  11/06/2012  Clinical Social Worker:  Cori Razor, LCSW  Date/time:  11/07/2012 01:27 PM  Clinical Social Work is seeking post-discharge placement for this patient at the following level of care:   SKILLED NURSING   (*CSW will update this form in Epic as items are completed)     Patient/family provided with [facility] Department of Clinical Social Work's list of facilities offering this level of care within the geographic area requested by the patient (or if unable, by the patient's family).  11/07/2012  Patient/family informed of their freedom to choose among providers that offer the needed level of care, that participate in Medicare, Medicaid or managed care program needed by the patient, have an available bed and are willing to accept the patient.    Patient/family informed of [facility]' ownership interest in [facility], as well as of the fact that they are under no obligation to receive care at this facility.  PASARR submitted to EDS on 11/06/2012 PASARR number received from EDS on 11/06/2012  FL2 transmitted to all facilities in geographic area requested by pt/family on  11/07/2012 FL2 transmitted to all facilities within larger geographic area on   Patient informed that his/her managed care company has contracts with or will negotiate with  certain facilities, including the following:     Patient/family informed of bed offers received:  11/07/2012 Patient chooses bed at [facility] Physician recommends and patient chooses bed at    Patient to be transferred to [facility] on  11/09/2012 Patient to be transferred to facility by EMS  The following physician request were entered in Epic:   Additional Comments: Cori Razor LCSW [phone]

## 2012-11-10 NOTE — ED Notes (Signed)
ptar called to transport pt back to [facility]

## 2012-11-12 ENCOUNTER — Encounter: Payer: Self-pay | Admitting: Adult Health

## 2012-11-12 ENCOUNTER — Non-Acute Institutional Stay (SKILLED_NURSING_FACILITY): Payer: Medicare Other | Admitting: Adult Health

## 2012-11-12 DIAGNOSIS — M1610 Unilateral primary osteoarthritis, unspecified hip: Secondary | ICD-10-CM

## 2012-11-12 DIAGNOSIS — I10 Essential (primary) hypertension: Secondary | ICD-10-CM

## 2012-11-12 DIAGNOSIS — L0291 Cutaneous abscess, unspecified: Secondary | ICD-10-CM

## 2012-11-12 DIAGNOSIS — R609 Edema, unspecified: Secondary | ICD-10-CM

## 2012-11-12 DIAGNOSIS — M169 Osteoarthritis of hip, unspecified: Secondary | ICD-10-CM

## 2012-11-12 DIAGNOSIS — B370 Candidal stomatitis: Secondary | ICD-10-CM

## 2012-11-12 DIAGNOSIS — D649 Anemia, unspecified: Secondary | ICD-10-CM

## 2012-11-12 DIAGNOSIS — E785 Hyperlipidemia, unspecified: Secondary | ICD-10-CM

## 2012-11-12 DIAGNOSIS — N318 Other neuromuscular dysfunction of bladder: Secondary | ICD-10-CM

## 2012-11-12 DIAGNOSIS — K5900 Constipation, unspecified: Secondary | ICD-10-CM

## 2012-11-12 DIAGNOSIS — N3281 Overactive bladder: Secondary | ICD-10-CM | POA: Insufficient documentation

## 2012-11-12 DIAGNOSIS — G894 Chronic pain syndrome: Secondary | ICD-10-CM

## 2012-11-12 DIAGNOSIS — L0390 Cellulitis, unspecified: Secondary | ICD-10-CM

## 2012-11-12 NOTE — Progress Notes (Signed)
  Subjective:    Patient ID: Wendy Torres, female    DOB: Jan 05, 1941, 72 y.o.   MRN: [id number]  HPI This is a 72 year old female has been admitted to [facility] on 11/09/12 from [facility] with right total hip arthroplasty with acetabular autograft. She has been admitted for a short-term rehabilitation. She was sent to the ER on 11/10/12 for right hip surgical redness and fever (100.2 degrees F) and was discharged back to [facility] with discharge diagnosis of cellulitis. She is currently on Keflex for that. She had no fever reported past 24 hours.    Review of Systems  Constitutional: Positive for fever.  HENT: Negative.   Eyes: Negative.   Respiratory: Negative for cough and shortness of breath.   Cardiovascular: Positive for leg swelling.  Gastrointestinal: Negative for abdominal pain and abdominal distention.  Endocrine: Negative.   Genitourinary: Negative.   Skin: Positive for wound.  Neurological: Negative.   Hematological: Negative for adenopathy. Does not bruise/bleed easily.  Psychiatric/Behavioral: Negative.        Objective:   Physical Exam  Nursing note and vitals reviewed. Constitutional: She is oriented to person, place, and time. She appears well-developed and well-nourished.  HENT:  Head: Normocephalic and atraumatic.  Right Ear: External ear normal.  Left Ear: External ear normal.  Nose: Nose normal.  Mouth/Throat: Oropharynx is clear and moist.  Eyes: Conjunctivae are normal. Pupils are equal, round, and reactive to light.  Neck: Normal range of motion. Neck supple. No thyromegaly present.  Cardiovascular: Normal rate, regular rhythm, normal heart sounds and intact distal pulses.   Pulmonary/Chest: Effort normal and breath sounds normal.  Abdominal: Soft. Bowel sounds are normal.  Musculoskeletal: She exhibits edema and tenderness.  Edema on RLE, 2+  Tenderness of right upper extremity  Neurological: She is alert and oriented to person, place,  and time.  Skin: Skin is warm and dry.  Psychiatric: She has a normal mood and affect. Her behavior is normal. Judgment and thought content normal.    LABS/PROCEDURES: 11/08/12 WBC 11.5 hemoglobin 9.6 hematocrit 29.1 sodium 136 potassium 3.5 glucose 132 BUN 10 creatinine 0.79 calcium 8.4     Medications reviewed.       Assessment & Plan:   Cellulitis - continue Keflex 500 mg by mouth 4 times daily x1 week  Constipation - stable  Overactive bladder - stable  Candida infection of mouth - continue Nystatin x 2 weeks then discontinue  OA (osteoarthritis) of hip S/P Right Total Hip Arthroplasty with acetabular autograft - for PT and OT  Hypertension - stable  Peripheral edema - stable  Chronic pain syndrome - start OxyIR 5 mg 1 tab PO Q 6AM, 2PM and 10PM  ANEMIA - stable  OVERACTIVE BLADDER - stable  HYPERLIPIDEMIA - stable

## 2012-11-13 ENCOUNTER — Ambulatory Visit: Payer: Medicare Other | Admitting: Internal Medicine

## 2012-11-14 ENCOUNTER — Non-Acute Institutional Stay (SKILLED_NURSING_FACILITY): Payer: Medicare Other | Admitting: Internal Medicine

## 2012-11-14 DIAGNOSIS — E876 Hypokalemia: Secondary | ICD-10-CM

## 2012-11-14 DIAGNOSIS — D62 Acute posthemorrhagic anemia: Secondary | ICD-10-CM

## 2012-11-14 DIAGNOSIS — I10 Essential (primary) hypertension: Secondary | ICD-10-CM

## 2012-11-14 DIAGNOSIS — M169 Osteoarthritis of hip, unspecified: Secondary | ICD-10-CM

## 2012-11-28 ENCOUNTER — Other Ambulatory Visit: Payer: Self-pay | Admitting: Internal Medicine

## 2012-12-01 ENCOUNTER — Other Ambulatory Visit: Payer: Self-pay | Admitting: Internal Medicine

## 2012-12-11 NOTE — Progress Notes (Signed)
Patient ID: Wendy Torres, female   DOB: November 24, 1940, 72 y.o.   MRN: [id number]        HISTORY & PHYSICAL  DATE: 11/14/2012   FACILITY: [facility]  LEVEL OF CARE: SNF (31)  ALLERGIES:  No Known Allergies  CHIEF COMPLAINT:  Manage right hip osteoarthritis, acute blood loss anemia, and hypokalemia.    HISTORY OF PRESENT ILLNESS:  The patient is a 72 year-old, African-American female.    HIP OSTEOARTHRITIS: patient had advanced end stage OA of the hip with progressively worsening pain & dysfunction.  Pt failed non-surgical conservative management.  Therefore pt underwent total hip arthroplasty & tolerated the procedure well.  Pt denies hip pain currently.  Pt was admitted to this facility for short term rehabilitation.    ANEMIA: Postoperatively, patient suffered acute blood loss.   The anemia has been stable. The patient denies fatigue, melena or hematochezia. No complications from the medications currently being used.  Last hemoglobins are:  10.3, 9.6, 11.8.  HYPOKALEMIA: The patient's hypokalemia remains stable. Patient denies muscle cramping or palpitations. No complications reported from current potassium supplementation.   PAST MEDICAL HISTORY :  Past Medical History  Diagnosis Date  . HYPERLIPIDEMIA 04/18/2007  . Morbid obesity 12/09/2006  . ANEMIA 01/14/2009  . Chronic pain syndrome 02/16/2009  . HYPERTENSION 12/09/2006  . ACUTE URIS OF UNSPECIFIED SITE 10/22/2007  . ALLERGIC RHINITIS 12/09/2006  . ASTHMA 12/09/2006  . COPD 12/09/2006  . Glossitis 01/14/2009  . GERD 12/09/2006  . HIATAL HERNIA 03/01/2010  . OVERACTIVE BLADDER 04/14/2008  . DEGENERATIVE JOINT DISEASE 12/09/2006  . LOW BACK PAIN 12/09/2006  . OSTEOPOROSIS 12/09/2006  . SYNCOPE 11/05/2008  . DIZZINESS 03/30/2010  . INSOMNIA-SLEEP DISORDER-UNSPEC 11/05/2008  . HYPERSOMNIA 08/31/2008  . PALPITATIONS, RECURRENT 01/27/2010  . DYSPNEA 01/27/2010  . Other dysphagia 01/14/2009  . FREQUENCY, URINARY 03/30/2010   . Abdominal pain, right upper quadrant 11/28/2007  . EPIGASTRIC PAIN 01/14/2009  . Abdominal  pain, other specified site 11/28/2007  . Nonspecific (abnormal) findings on radiological and other examination of body structure 11/05/2008  . TRANSIENT ISCHEMIC ATTACK, HX OF 12/09/2006  . HELICOBACTER PYLORI GASTRITIS, HX OF 12/09/2006  . ABNORMAL CHEST XRAY 11/05/2008  . Gait disorder 09/04/2011    PAST SURGICAL HISTORY: Past Surgical History  Procedure Laterality Date  . Tubal ligation    . Lumbar disc surgery    . Colonoscopy    . Tonsillectomy      as a child  . Total hip arthroplasty Right 11/06/2012    Procedure: RIGHT TOTAL HIP ARTHROPLASTY WITH ACETABULAR AUTOGRAFT;  Surgeon: Loanne Drilling, MD;  Location: [facility];  Service: Orthopedics;  Laterality: Right;    SOCIAL HISTORY:  reports that she quit smoking about 40 years ago. Her smoking use included Cigarettes. She has a 12.5 pack-year smoking history. She does not have any smokeless tobacco history on file. She reports that she does not drink alcohol or use illicit drugs.  FAMILY HISTORY:  Family History  Problem Relation Age of Onset  . Heart disease Mother     Had pacemaker  . Cancer Father     Lung  . Hypertension Brother     CURRENT MEDICATIONS: Reviewed per [facility]  REVIEW OF SYSTEMS:   MUSCULOSKELETAL:  Complains of increasing right lower extremity swelling and burning pain.    See HPI otherwise 14 point ROS is negative.  PHYSICAL EXAMINATION  VS:  T 98.7       P 82  RR 18      BP 123/63      POX%        WT (Lb)  GENERAL: no acute distress, moderately obese body habitus SKIN: warm & dry, no suspicious lesions or rashes, no excessive dryness EYES: conjunctivae normal, sclerae normal, normal eye lids MOUTH/THROAT: lips without lesions,no lesions in the mouth,tongue is without lesions,uvula elevates in midline NECK: supple, trachea midline, no neck masses, no thyroid tenderness, no thyromegaly LYMPHATICS: no LAN in the  neck, no supraclavicular LAN RESPIRATORY: breathing is even & unlabored, BS CTAB CARDIAC: RRR, no murmur,no extra heart sounds EDEMA/VARICOSITIES: right lower extremity has +2 edema, left lower extremity has +1 edema ARTERIAL: pedal pulses diminished  GI:  ABDOMEN: abdomen soft, normal BS, no masses, no tenderness  LIVER/SPLEEN: no hepatomegaly, no splenomegaly MUSCULOSKELETAL: HEAD: normal to inspection & palpation BACK: no kyphosis, scoliosis or spinal processes tenderness EXTREMITIES: LEFT UPPER EXTREMITY: full range of motion, normal strength & tone RIGHT UPPER EXTREMITY:  full range of motion, normal strength & tone LEFT LOWER EXTREMITY:  full range of motion, normal strength & tone RIGHT LOWER EXTREMITY: strength intact, range of motion not tested due to surgery  PSYCHIATRIC: the patient is alert & oriented to person, affect & behavior appropriate  LABS/RADIOLOGY: Hemoglobin 10.3, MCV 82.7, otherwise CBC normal.    Glucose 166, otherwise BMP normal.    MRSA by PCR positive.  PTT 29, PT 12.4, INR 0.94.    Alkaline phosphatase 118, otherwise liver profile normal.    Urinalysis negative.   Chest x-ray:  No acute disease.    Right hip x-ray showed severe bilateral hip arthritis.    Pelvic x-ray postsurgically showed total right hip replacement in satisfactory position.    ASSESSMENT/PLAN:  Right hip osteoarthritis.  Status post right total hip arthroplasty.  Continue rehabilitation.   Acute blood loss anemia.  Continue iron.  Reassess.   Hypokalemia.  Continue supplementation.  Reassess.   Hypertension.  Well controlled.    Right lower extremity edema.  New onset.  Significant problem.  Obtain venous doppler.  Check CBC and BMP.    I have reviewed patient's medical records received at admission/from hospitalization.  CPT CODE: 16109

## 2012-12-12 DIAGNOSIS — D62 Acute posthemorrhagic anemia: Secondary | ICD-10-CM | POA: Insufficient documentation

## 2013-01-07 ENCOUNTER — Other Ambulatory Visit: Payer: Self-pay | Admitting: Internal Medicine

## 2013-01-07 NOTE — Telephone Encounter (Signed)
Done erx - gabapentin refill

## 2013-04-11 ENCOUNTER — Other Ambulatory Visit: Payer: Self-pay | Admitting: Internal Medicine

## 2013-05-21 ENCOUNTER — Other Ambulatory Visit: Payer: Self-pay | Admitting: Internal Medicine

## 2013-05-21 NOTE — Telephone Encounter (Signed)
Done hardcopy to robin  

## 2013-05-21 NOTE — Telephone Encounter (Signed)
Faxed hardcopy to [facility] 

## 2013-05-29 ENCOUNTER — Other Ambulatory Visit: Payer: Self-pay | Admitting: Internal Medicine

## 2013-06-03 ENCOUNTER — Other Ambulatory Visit: Payer: Self-pay | Admitting: Internal Medicine

## 2013-06-03 NOTE — Telephone Encounter (Signed)
It appears [place] now [place], and likely [place] a [place] physician who does her med refills  Please verify with pt

## 2013-06-03 NOTE — Telephone Encounter (Signed)
I think [place] is now [place] (I could be wrong), so may be under care of attending MD there, robin to check

## 2013-06-04 NOTE — Telephone Encounter (Signed)
Called left message to call back 

## 2013-07-16 ENCOUNTER — Other Ambulatory Visit: Payer: Self-pay | Admitting: Internal Medicine

## 2013-07-16 NOTE — Telephone Encounter (Signed)
Done erx 

## 2013-07-24 ENCOUNTER — Other Ambulatory Visit: Payer: Self-pay | Admitting: Internal Medicine

## 2013-07-24 NOTE — Telephone Encounter (Signed)
Faxed hardcopy to [facility] 

## 2013-08-13 ENCOUNTER — Ambulatory Visit: Payer: Medicare Other | Admitting: Podiatry

## 2013-08-13 ENCOUNTER — Ambulatory Visit: Payer: Medicare Other

## 2013-08-20 ENCOUNTER — Encounter: Payer: Self-pay | Admitting: Podiatry

## 2013-08-20 ENCOUNTER — Ambulatory Visit (INDEPENDENT_AMBULATORY_CARE_PROVIDER_SITE_OTHER): Payer: Medicare Other | Admitting: Podiatry

## 2013-08-20 DIAGNOSIS — L600 Ingrowing nail: Secondary | ICD-10-CM

## 2013-08-20 DIAGNOSIS — M2010 Hallux valgus (acquired), unspecified foot: Secondary | ICD-10-CM

## 2013-08-20 DIAGNOSIS — B351 Tinea unguium: Secondary | ICD-10-CM

## 2013-08-20 NOTE — Patient Instructions (Signed)

## 2013-08-20 NOTE — Progress Notes (Signed)
   Subjective:    Patient ID: Wendy Torres, female    DOB: 02/27/1941, 73 y.o.   MRN: [id number]  HPI Comments: "I have a bad toe"  Patient c/o aching 1st toe left for a several months. The toenail is thick and discolored. The skin around it is red and slightly swollen. Very tender with shoes. Unable to trim it down.   Toe Pain       Review of Systems  Eyes: Positive for pain.  Cardiovascular: Positive for leg swelling.  Musculoskeletal: Positive for arthralgias, back pain and myalgias.  All other systems reviewed and are negative.      Objective:   Physical Exam        Assessment & Plan:

## 2013-08-21 NOTE — Progress Notes (Signed)
Subjective:     Patient ID: Wendy Torres, female   DOB: 01/09/1941, 73 y.o.   MRN: [id number]  Toe Pain    patient presents with caregiver stating I have a very thickened painful big toenail left I cannot cut and it has been growing this way for a long time. States that her other one was removed and she would like to have this one remove the same way   Review of Systems  All other systems reviewed and are negative.      Objective:   Physical Exam  Nursing note and vitals reviewed. Constitutional: She is oriented to person, place, and time.  Cardiovascular: Intact distal pulses.   Musculoskeletal: Normal range of motion.  Neurological: She is oriented to person, place, and time.  Skin: Skin is warm.   neurovascular status intact with severely thickened deformed hallux nail left is loose and painful when pressed. Patient has normal Fill time to the toes the digits are well perfused and she has normal arch height. I noted muscle strength to be adequate in range of motion adequate     Assessment:     Severe thickness of the left hallux nail with dystrophic changes and pain    Plan:     H&P reviewed and condition discussed with her and her caregiver. Recommended removal and explained risk of permanent procedure and patient and caregiver are aware of this and want to surgery and the fact this will be a permanent procedure. Infiltrated 60 mg Xylocaine Marcaine mixture remove the hallux nail and exposed matrix applying 5 applications of phenol 30 seconds followed by alcohol lavaged and sterile dressing

## 2013-09-04 ENCOUNTER — Telehealth: Payer: Self-pay | Admitting: *Deleted

## 2013-09-04 NOTE — Telephone Encounter (Signed)
Had toenail removed couple of weeks ago.  She wants to know if she should continue to soak it.  When should she use the Neosporin?  Getting a scab at top but no the bottom.  I called and informed her that as long as she has drainage keep doing the dressings and the soaks.  She stated that when she took the bandage off this morning there wasn't any drainage.  I informed her that Neosporiin isn't needed unless there was drainage.

## 2013-09-23 ENCOUNTER — Other Ambulatory Visit: Payer: Self-pay | Admitting: Internal Medicine

## 2013-09-23 NOTE — Telephone Encounter (Signed)
Faxed hardcopy to [place]

## 2013-09-23 NOTE — Telephone Encounter (Signed)
Done hardcopy to robin  

## 2013-10-07 ENCOUNTER — Other Ambulatory Visit: Payer: Self-pay | Admitting: Internal Medicine

## 2013-10-22 ENCOUNTER — Other Ambulatory Visit: Payer: Self-pay | Admitting: Internal Medicine

## 2013-10-22 NOTE — Telephone Encounter (Signed)
Xanax not refilled  Due for ROV

## 2013-10-22 NOTE — Telephone Encounter (Signed)
Called the daughter Hassan Rowan informed of MD's response to refill request.  Will call back and schedule.

## 2013-10-24 ENCOUNTER — Other Ambulatory Visit: Payer: Self-pay | Admitting: Internal Medicine

## 2013-10-30 ENCOUNTER — Telehealth: Payer: Self-pay

## 2013-10-30 NOTE — Telephone Encounter (Signed)
Called the patient to inform we received from [place] surgical clearance form.  PCP will not clear for surgery until seen in the office for OV as has not been seen since June 2014.  The patient stated she would inform her daughter who would call and schedule.

## 2013-11-13 ENCOUNTER — Other Ambulatory Visit: Payer: Self-pay | Admitting: Internal Medicine

## 2013-11-14 ENCOUNTER — Other Ambulatory Visit: Payer: Self-pay | Admitting: Internal Medicine

## 2013-11-16 ENCOUNTER — Other Ambulatory Visit: Payer: Self-pay | Admitting: Internal Medicine

## 2013-11-26 ENCOUNTER — Ambulatory Visit: Payer: Medicare Other | Admitting: Internal Medicine

## 2013-11-26 DIAGNOSIS — Z0289 Encounter for other administrative examinations: Secondary | ICD-10-CM

## 2013-12-01 ENCOUNTER — Other Ambulatory Visit: Payer: Self-pay | Admitting: Internal Medicine

## 2013-12-03 ENCOUNTER — Encounter: Payer: Self-pay | Admitting: Internal Medicine

## 2013-12-03 ENCOUNTER — Ambulatory Visit (INDEPENDENT_AMBULATORY_CARE_PROVIDER_SITE_OTHER): Payer: Medicare Other | Admitting: Internal Medicine

## 2013-12-03 VITALS — BP 150/80 | HR 92 | Temp 98.9°F | Wt 196.2 lb

## 2013-12-03 DIAGNOSIS — Z0000 Encounter for general adult medical examination without abnormal findings: Secondary | ICD-10-CM

## 2013-12-03 DIAGNOSIS — Z01818 Encounter for other preprocedural examination: Secondary | ICD-10-CM

## 2013-12-03 DIAGNOSIS — I10 Essential (primary) hypertension: Secondary | ICD-10-CM

## 2013-12-03 DIAGNOSIS — Z23 Encounter for immunization: Secondary | ICD-10-CM

## 2013-12-03 DIAGNOSIS — F411 Generalized anxiety disorder: Secondary | ICD-10-CM

## 2013-12-03 MED ORDER — ALPRAZOLAM 0.25 MG PO TABS: ORAL_TABLET | ORAL | Status: DC

## 2013-12-03 MED ORDER — LOSARTAN POTASSIUM 50 MG PO TABS: 50.0000 mg | ORAL_TABLET | Freq: Every day | ORAL | Status: DC

## 2013-12-03 MED ORDER — LOVASTATIN 40 MG PO TABS: 40.0000 mg | ORAL_TABLET | Freq: Every day | ORAL | Status: DC

## 2013-12-03 NOTE — Progress Notes (Signed)
Subjective:    Patient ID: Wendy Torres, female    DOB: February 05, 1941, 73 y.o.   MRN: [id number]  HPI  Here for wellness and f/u with daughter, and preop eval;  Overall doing ok;  Pt denies CP, worsening SOB, DOE, wheezing, orthopnea, PND, worsening LE edema, palpitations, dizziness or syncope.  Pt denies neurological change such as new headache, facial or extremity weakness.  Pt denies polydipsia, polyuria, or low sugar symptoms. Pt states overall good compliance with treatment and medications, good tolerability, and has been trying to follow lower cholesterol diet.  Pt denies worsening depressive symptoms, suicidal ideation or panic. No fever, night sweats, wt loss, loss of appetite, or other constitutional symptoms.  Pt states good ability with ADL's, has low fall risk, home safety reviewed and adequate, no other significant changes in hearing or vision, and only minimally active with exercise, due to ongoing left hip pain, due for let hip THR soon, not yet scheduled.  Tolerated right hip THR well last yr, finished PT. Currently walks with seated walker. No recent falls.  Past Medical History  Diagnosis Date  . HYPERLIPIDEMIA 04/18/2007  . Morbid obesity 12/09/2006  . ANEMIA 01/14/2009  . Chronic pain syndrome 02/16/2009  . HYPERTENSION 12/09/2006  . ACUTE URIS OF UNSPECIFIED SITE 10/22/2007  . ALLERGIC RHINITIS 12/09/2006  . ASTHMA 12/09/2006  . COPD 12/09/2006  . Glossitis 01/14/2009  . GERD 12/09/2006  . HIATAL HERNIA 03/01/2010  . OVERACTIVE BLADDER 04/14/2008  . DEGENERATIVE JOINT DISEASE 12/09/2006  . LOW BACK PAIN 12/09/2006  . OSTEOPOROSIS 12/09/2006  . SYNCOPE 11/05/2008  . DIZZINESS 03/30/2010  . INSOMNIA-SLEEP DISORDER-UNSPEC 11/05/2008  . HYPERSOMNIA 08/31/2008  . PALPITATIONS, RECURRENT 01/27/2010  . DYSPNEA 01/27/2010  . Other dysphagia 01/14/2009  . FREQUENCY, URINARY 03/30/2010  . Abdominal pain, right upper quadrant 11/28/2007  . EPIGASTRIC PAIN 01/14/2009  . Abdominal pain, other  specified site 11/28/2007  . Nonspecific (abnormal) findings on radiological and other examination of body structure 11/05/2008  . TRANSIENT ISCHEMIC ATTACK, HX OF 12/09/2006  . HELICOBACTER PYLORI GASTRITIS, HX OF 12/09/2006  . ABNORMAL CHEST XRAY 11/05/2008  . Gait disorder 09/04/2011   Past Surgical History  Procedure Laterality Date  . Tubal ligation    . Lumbar disc surgery    . Colonoscopy    . Tonsillectomy      as a child  . Total hip arthroplasty Right 11/06/2012    Procedure: RIGHT TOTAL HIP ARTHROPLASTY WITH ACETABULAR AUTOGRAFT;  Surgeon: Gearlean Alf, MD;  Location: [facility];  Service: Orthopedics;  Laterality: Right;    reports that she quit smoking about 41 years ago. Her smoking use included Cigarettes. She has a 12.5 pack-year smoking history. She does not have any smokeless tobacco history on file. She reports that she does not drink alcohol or use illicit drugs. family history includes Cancer in her father; Heart disease in her mother; Hypertension in her brother. No Known Allergies Current Outpatient Prescriptions on File Prior to Visit  Medication Sig Dispense Refill  . amLODipine (NORVASC) 10 MG tablet TAKE 1 TABLET BY MOUTH DAILY.  90 tablet  0  . aspirin 325 MG tablet Take 325 mg by mouth daily.      . Cholecalciferol 1000 UNITS TBDP Take by mouth.      . cloNIDine (CATAPRES) 0.3 MG tablet Take 0.3 mg by mouth 2 (two) times daily.      . furosemide (LASIX) 20 MG tablet Take 20 mg by mouth every morning.      Marland Kitchen  gabapentin (NEURONTIN) 300 MG capsule Take 300 mg by mouth 3 (three) times daily.      Marland Kitchen omeprazole (PRILOSEC) 20 MG capsule TAKE 2 CAPSULES (40 MG TOTAL) BY MOUTH DAILY.  180 capsule  2  . oxybutynin (DITROPAN) 5 MG tablet Take 5 mg by mouth 3 (three) times daily.      Marland Kitchen oxybutynin (DITROPAN) 5 MG tablet TAKE 1 TABLET (5 MG TOTAL) BY MOUTH 3 (THREE) TIMES DAILY.  120 tablet  8  . traZODone (DESYREL) 50 MG tablet Take 50 mg by mouth at bedtime. Take 0.5-1 tablets  (25-50 mg total) by mouth at bedtime as needed for sleep.      . [DISCONTINUED] lansoprazole (PREVACID) 30 MG capsule Take 1 capsule (30 mg total) by mouth daily.  90 capsule  3  . [DISCONTINUED] pantoprazole (PROTONIX) 40 MG tablet 1 tab by mouth twice per day  60 tablet  11   No current facility-administered medications on file prior to visit.   Review of Systems Constitutional: Negative for increased diaphoresis, other activity, appetite or other siginficant weight change  HENT: Negative for worsening hearing loss, ear pain, facial swelling, mouth sores and neck stiffness.   Eyes: Negative for other worsening pain, redness or visual disturbance.  Respiratory: Negative for shortness of breath and wheezing.   Cardiovascular: Negative for chest pain and palpitations.  Gastrointestinal: Negative for diarrhea, blood in stool, abdominal distention or other pain Genitourinary: Negative for hematuria, flank pain or change in urine volume.  Musculoskeletal: Negative for myalgias or other joint complaints.  Skin: Negative for color change and wound.  Neurological: Negative for syncope and numbness. other than noted Hematological: Negative for adenopathy. or other swelling Psychiatric/Behavioral: Negative for hallucinations, self-injury, decreased concentration or other worsening agitation.      Objective:   Physical Exam BP 150/80  Pulse 92  Temp(Src) 98.9 F (37.2 C) (Oral)  Wt 196 lb 4 oz (89.018 kg)  SpO2 93% VS noted,  Constitutional: Pt is oriented to person, place, and time. Appears well-developed and well-nourished.  Head: Normocephalic and atraumatic.  Right Ear: External ear normal.  Left Ear: External ear normal.  Nose: Nose normal.  Mouth/Throat: Oropharynx is clear and moist.  Eyes: Conjunctivae and EOM are normal. Pupils are equal, round, and reactive to light.  Neck: Normal range of motion. Neck supple. No JVD present. No tracheal deviation present.  Cardiovascular:  Normal rate, regular rhythm, normal heart sounds and intact distal pulses.   Pulmonary/Chest: Effort normal and breath sounds without rales or wheezing  Abdominal: Soft. Bowel sounds are normal. NT. No HSM  Musculoskeletal: Normal range of motion. Exhibits no edema.  Lymphadenopathy:  Has no cervical adenopathy.  Neurological: Pt is alert and oriented to person, place, and time. Pt has normal reflexes. No cranial nerve deficit. Motor grossly intact, walks with seated roling walker Skin: Skin is warm and dry. No rash noted.  Psychiatric:  Has normal mood and affect. Behavior is normal.     Assessment & Plan:

## 2013-12-03 NOTE — Patient Instructions (Addendum)
You had the new Prevnar pneumonia shot today  Please take all new medication as prescribed - the losartan 50 mg per day  Please continue all other medications as before, and refills have been done if requested - the xanax  Please have the pharmacy call with any other refills you may need.  Please continue your efforts at being more active, low cholesterol diet, and weight control.  You are otherwise up to date with prevention measures today.  Please keep your appointments with your specialists as you may have planned  Please remember to followup with your yearly mammogram  Please go to the LAB in the Basement (turn left off the elevator) for the tests to be done tomorrow  You will be contacted by phone if any changes need to be made immediately.  Otherwise, you will receive a letter about your results with an explanation, but please check with MyChart first.  Please remember to sign up for MyChart if you have not done so, as this will be important to you in the future with finding out test results, communicating by private email, and scheduling acute appointments online when needed.  Please return in 6 months, or sooner if needed  The form for OK for surgury will be sent to your orthopedic physician

## 2013-12-03 NOTE — Progress Notes (Signed)
Pre visit review using our clinic review tool, if applicable. No additional management support is needed unless otherwise documented below in the visit note. 

## 2013-12-03 NOTE — Assessment & Plan Note (Addendum)
Ok for surgury left hip THA pending labs

## 2013-12-03 NOTE — Assessment & Plan Note (Signed)
Ok for refill xanax rpn

## 2013-12-03 NOTE — Assessment & Plan Note (Signed)
Mild uncontrolled, for losartan 50 qd,  to f/u any worsening symptoms or concerns

## 2013-12-03 NOTE — Assessment & Plan Note (Signed)

## 2013-12-04 ENCOUNTER — Telehealth: Payer: Self-pay

## 2013-12-04 ENCOUNTER — Encounter: Payer: Self-pay | Admitting: Internal Medicine

## 2013-12-04 ENCOUNTER — Other Ambulatory Visit (INDEPENDENT_AMBULATORY_CARE_PROVIDER_SITE_OTHER): Payer: Medicare Other

## 2013-12-04 DIAGNOSIS — Z0000 Encounter for general adult medical examination without abnormal findings: Secondary | ICD-10-CM

## 2013-12-04 DIAGNOSIS — I10 Essential (primary) hypertension: Secondary | ICD-10-CM

## 2013-12-04 LAB — CBC WITH DIFFERENTIAL/PLATELET
BASOS PCT: 0.4 % (ref 0.0–3.0)
Basophils Absolute: 0 10*3/uL (ref 0.0–0.1)
EOS ABS: 0.2 10*3/uL (ref 0.0–0.7)
EOS PCT: 1.8 % (ref 0.0–5.0)
HCT: 31.5 % — ABNORMAL LOW (ref 36.0–46.0)
HEMOGLOBIN: 10 g/dL — AB (ref 12.0–15.0)
LYMPHS PCT: 34.2 % (ref 12.0–46.0)
Lymphs Abs: 3 10*3/uL (ref 0.7–4.0)
MCHC: 31.8 g/dL (ref 30.0–36.0)
MCV: 72.5 fl — ABNORMAL LOW (ref 78.0–100.0)
Monocytes Absolute: 0.8 10*3/uL (ref 0.1–1.0)
Monocytes Relative: 9.5 % (ref 3.0–12.0)
NEUTROS ABS: 4.8 10*3/uL (ref 1.4–7.7)
Neutrophils Relative %: 54.1 % (ref 43.0–77.0)
Platelets: 362 10*3/uL (ref 150.0–400.0)
RBC: 4.34 Mil/uL (ref 3.87–5.11)
RDW: 17.3 % — ABNORMAL HIGH (ref 11.5–15.5)
WBC: 8.9 10*3/uL (ref 4.0–10.5)

## 2013-12-04 LAB — URINALYSIS, ROUTINE W REFLEX MICROSCOPIC
Bilirubin Urine: NEGATIVE
HGB URINE DIPSTICK: NEGATIVE
Ketones, ur: NEGATIVE
NITRITE: NEGATIVE
PH: 7 (ref 5.0–8.0)
RBC / HPF: NONE SEEN (ref 0–?)
Specific Gravity, Urine: <=1.005 — AB (ref 1.000–1.030)
TOTAL PROTEIN, URINE-UPE24: NEGATIVE
URINE GLUCOSE: NEGATIVE
Urobilinogen, UA: 0.2 (ref 0.0–1.0)

## 2013-12-04 LAB — HEPATIC FUNCTION PANEL
ALT: 11 U/L (ref 0–35)
AST: 19 U/L (ref 0–37)
Albumin: 3.9 g/dL (ref 3.5–5.2)
Alkaline Phosphatase: 105 U/L (ref 39–117)
BILIRUBIN DIRECT: 0 mg/dL (ref 0.0–0.3)
TOTAL PROTEIN: 8.2 g/dL (ref 6.0–8.3)
Total Bilirubin: 0.3 mg/dL (ref 0.2–1.2)

## 2013-12-04 LAB — BASIC METABOLIC PANEL
BUN: 10 mg/dL (ref 6–23)
CALCIUM: 10 mg/dL (ref 8.4–10.5)
CO2: 26 mEq/L (ref 19–32)
Chloride: 103 mEq/L (ref 96–112)
Creatinine, Ser: 0.9 mg/dL (ref 0.4–1.2)
GFR: 77.54 mL/min (ref >60.00)
Glucose, Bld: 86 mg/dL (ref 70–99)
Potassium: 3.8 mEq/L (ref 3.5–5.1)
Sodium: 139 mEq/L (ref 135–145)

## 2013-12-04 LAB — LIPID PANEL
CHOL/HDL RATIO: 3
Cholesterol: 153 mg/dL (ref 0–200)
HDL: 53.6 mg/dL (ref >39.00)
LDL Cholesterol: 87 mg/dL (ref 0–99)
NONHDL: 99.4
Triglycerides: 64 mg/dL (ref 0.0–149.0)
VLDL: 12.8 mg/dL (ref 0.0–40.0)

## 2013-12-04 LAB — TSH: TSH: 0.3 u[IU]/mL — ABNORMAL LOW (ref 0.35–4.50)

## 2013-12-04 NOTE — Telephone Encounter (Signed)
Faxed Surgical Clearance to [place] at [phone]  ATTN: Abigail Butts. PCP did clear for surgery.

## 2013-12-05 ENCOUNTER — Telehealth: Payer: Self-pay | Admitting: Internal Medicine

## 2013-12-05 MED ORDER — LOSARTAN POTASSIUM 50 MG PO TABS: 50.0000 mg | ORAL_TABLET | Freq: Every day | ORAL | Status: DC

## 2013-12-05 NOTE — Telephone Encounter (Signed)
Called the patient did confirm per PCP's notes from last OV he started her on losartan 50 mg, but she stated the pharmacy did not have. Resent into the pharmacy for the patient.  Informed to please call back if they did not receive.

## 2013-12-05 NOTE — Telephone Encounter (Signed)
Patients pharmacy is requesting you call the patient in regards to a new blood pressure med the patient states she is suppose to be on but the pharmacy does not have.  Patient does not know the name of the med.

## 2013-12-17 ENCOUNTER — Other Ambulatory Visit: Payer: Self-pay | Admitting: Internal Medicine

## 2013-12-17 NOTE — Telephone Encounter (Signed)
Done erx 

## 2013-12-25 ENCOUNTER — Other Ambulatory Visit: Payer: Self-pay | Admitting: Internal Medicine

## 2014-01-09 ENCOUNTER — Other Ambulatory Visit: Payer: Self-pay | Admitting: Internal Medicine

## 2014-01-20 ENCOUNTER — Other Ambulatory Visit: Payer: Self-pay | Admitting: Orthopedic Surgery

## 2014-01-20 NOTE — Progress Notes (Signed)
Preoperative surgical orders have been place into the Epic hospital system for [facility] on 01/20/2014, 2:00 PM  by Mickel Crow for surgery on 02-18-2014.  Preop Total Hip orders including Experel Injecion, IV Tylenol, and IV Decadron as long as there are no contraindications to the above medications. Arlee Muslim, PA-C

## 2014-01-27 ENCOUNTER — Other Ambulatory Visit: Payer: Self-pay

## 2014-01-27 MED ORDER — GABAPENTIN 300 MG PO CAPS: 300.0000 mg | ORAL_CAPSULE | Freq: Three times a day (TID) | ORAL | Status: DC

## 2014-01-28 ENCOUNTER — Other Ambulatory Visit: Payer: Self-pay

## 2014-01-28 MED ORDER — FUROSEMIDE 20 MG PO TABS: 20.0000 mg | ORAL_TABLET | Freq: Every morning | ORAL | Status: DC

## 2014-02-06 ENCOUNTER — Encounter (HOSPITAL_COMMUNITY): Payer: Self-pay | Admitting: Pharmacy Technician

## 2014-02-09 ENCOUNTER — Other Ambulatory Visit (HOSPITAL_COMMUNITY): Payer: Self-pay | Admitting: Orthopedic Surgery

## 2014-02-09 NOTE — Patient Instructions (Addendum)
[facility]  02/09/2014   Your procedure is scheduled on: Wednesday October 21st, 2015  Report to [facility] and follow signs to  [place] at 630 AM.  Call this number if you have problems the morning of surgery [phone]   Remember:  Do not eat food or drink liquids :After Midnight.     Take these medicines the morning of surgery with A SIP OF WATER: alprazolam, norvasc, systane eye drop, clonidine, gabapentin, omeprazole                               You may not have any metal on your body including hair pins and piercings  Do not wear jewelry, make-up, lotions, powders, or deodorant.   Men may shave face and neck.  Do not bring valuables to the hospital. [place].  Contacts, dentures or bridgework may not be worn into surgery.  Leave suitcase in the car. After surgery it may be brought to your room.  For patients admitted to the hospital, checkout time is 11:00 AM the day of discharge.   Patients discharged the day of surgery will not be allowed to drive home.  Name and phone number of your driver:  Special Instructions: N/A ________________________________________________________________________  [facility] - Preparing for Surgery Before surgery, you can play an important role.  Because skin is not sterile, your skin needs to be as free of germs as possible.  You can reduce the number of germs on your skin by washing with CHG (chlorahexidine gluconate) soap before surgery.  CHG is an antiseptic cleaner which kills germs and bonds with the skin to continue killing germs even after washing. Please DO NOT use if you have an allergy to CHG or antibacterial soaps.  If your skin becomes reddened/irritated stop using the CHG and inform your nurse when you arrive at Short Stay. Do not shave (including legs and underarms) for at least 48 hours prior to the first CHG shower.  You may shave your  face/neck. Please follow these instructions carefully:  1.  Shower with CHG Soap the night before surgery and the  morning of Surgery.  2.  If you choose to wash your hair, wash your hair first as usual with your  normal  shampoo.  3.  After you shampoo, rinse your hair and body thoroughly to remove the  shampoo.                           4.  Use CHG as you would any other liquid soap.  You can apply chg directly  to the skin and wash                       Gently with a scrungie or clean washcloth.  5.  Apply the CHG Soap to your body ONLY FROM THE NECK DOWN.   Do not use on face/ open                           Wound or open sores. Avoid contact with eyes, ears mouth and genitals (private parts).                       Wash face,  Genitals (private parts) with your normal soap.  6.  Wash thoroughly, paying special attention to the area where your surgery  will be performed.  7.  Thoroughly rinse your body with warm water from the neck down.  8.  DO NOT shower/wash with your normal soap after using and rinsing off  the CHG Soap.                9.  Pat yourself dry with a clean towel.            10.  Wear clean pajamas.            11.  Place clean sheets on your bed the night of your first shower and do not  sleep with pets. Day of Surgery : Do not apply any lotions/deodorants the morning of surgery.  Please wear clean clothes to the hospital/surgery center.  FAILURE TO FOLLOW THESE INSTRUCTIONS MAY RESULT IN THE CANCELLATION OF YOUR SURGERY PATIENT SIGNATURE_________________________________  NURSE SIGNATURE__________________________________  ________________________________________________________________________   Adam Phenix  An incentive spirometer is a tool that can help keep your lungs clear and active. This tool measures how well you are filling your lungs with each breath. Taking long deep breaths may help reverse or decrease the chance of developing breathing  (pulmonary) problems (especially infection) following:  A long period of time when you are unable to move or be active. BEFORE THE PROCEDURE   If the spirometer includes an indicator to show your best effort, your nurse or respiratory therapist will set it to a desired goal.  If possible, sit up straight or lean slightly forward. Try not to slouch.  Hold the incentive spirometer in an upright position. INSTRUCTIONS FOR USE  1. Sit on the edge of your bed if possible, or sit up as far as you can in bed or on a chair. 2. Hold the incentive spirometer in an upright position. 3. Breathe out normally. 4. Place the mouthpiece in your mouth and seal your lips tightly around it. 5. Breathe in slowly and as deeply as possible, raising the piston or the ball toward the top of the column. 6. Hold your breath for 3-5 seconds or for as long as possible. Allow the piston or ball to fall to the bottom of the column. 7. Remove the mouthpiece from your mouth and breathe out normally. 8. Rest for a few seconds and repeat Steps 1 through 7 at least 10 times every 1-2 hours when you are awake. Take your time and take a few normal breaths between deep breaths. 9. The spirometer may include an indicator to show your best effort. Use the indicator as a goal to work toward during each repetition. 10. After each set of 10 deep breaths, practice coughing to be sure your lungs are clear. If you have an incision (the cut made at the time of surgery), support your incision when coughing by placing a pillow or rolled up towels firmly against it. Once you are able to get out of bed, walk around indoors and cough well. You may stop using the incentive spirometer when instructed by your caregiver.  RISKS AND COMPLICATIONS  Take your time so you do not get dizzy or light-headed.  If you are in pain, you may need to take or ask for pain medication before doing incentive spirometry. It is harder to take a deep breath if you  are having pain. AFTER USE  Rest and breathe slowly and easily.  It can be helpful to keep track of a log of  your progress. Your caregiver can provide you with a simple table to help with this. If you are using the spirometer at home, follow these instructions: [place] IF:   You are having difficultly using the spirometer.  You have trouble using the spirometer as often as instructed.  Your pain medication is not giving enough relief while using the spirometer.  You develop fever of 100.5 F (38.1 C) or higher. SEEK IMMEDIATE MEDICAL CARE IF:   You cough up bloody sputum that had not been present before.  You develop fever of 102 F (38.9 C) or greater.  You develop worsening pain at or near the incision site. MAKE SURE YOU:   Understand these instructions.  Will watch your condition.  Will get help right away if you are not doing well or get worse. Document Released: 08/28/2006 Document Revised: 07/10/2011 Document Reviewed: 10/29/2006 Exit[employer] Patient Information 2014 [employer], [place].   ________________________________________________________________________  WHAT IS A BLOOD TRANSFUSION? Blood Transfusion Information  A transfusion is the replacement of blood or some of its parts. Blood is made up of multiple cells which provide different functions.  Red blood cells carry oxygen and are used for blood loss replacement.  White blood cells fight against infection.  Platelets control bleeding.  Plasma helps clot blood.  Other blood products are available for specialized needs, such as hemophilia or other clotting disorders. BEFORE THE TRANSFUSION  Who gives blood for transfusions?   Healthy volunteers who are fully evaluated to make sure their blood is safe. This is blood bank blood. Transfusion therapy is the safest it has ever been in the practice of medicine. Before blood is taken from a donor, a complete history is taken to make sure that person has  no history of diseases nor engages in risky social behavior (examples are intravenous drug use or sexual activity with multiple partners). The donor's travel history is screened to minimize risk of transmitting infections, such as malaria. The donated blood is tested for signs of infectious diseases, such as HIV and hepatitis. The blood is then tested to be sure it is compatible with you in order to minimize the chance of a transfusion reaction. If you or a relative donates blood, this is often done in anticipation of surgery and is not appropriate for emergency situations. It takes many days to process the donated blood. RISKS AND COMPLICATIONS Although transfusion therapy is very safe and saves many lives, the main dangers of transfusion include:   Getting an infectious disease.  Developing a transfusion reaction. This is an allergic reaction to something in the blood you were given. Every precaution is taken to prevent this. The decision to have a blood transfusion has been considered carefully by your caregiver before blood is given. Blood is not given unless the benefits outweigh the risks. AFTER THE TRANSFUSION  Right after receiving a blood transfusion, you will usually feel much better and more energetic. This is especially true if your red blood cells have gotten low (anemic). The transfusion raises the level of the red blood cells which carry oxygen, and this usually causes an energy increase.  The nurse administering the transfusion will monitor you carefully for complications. HOME CARE INSTRUCTIONS  No special instructions are needed after a transfusion. You may find your energy is better. Speak with your caregiver about any limitations on activity for underlying diseases you may have. SEEK MEDICAL CARE IF:   Your condition is not improving after your transfusion.  You develop redness or  irritation at the intravenous (IV) site. SEEK IMMEDIATE MEDICAL CARE IF:  Any of the following  symptoms occur over the next 12 hours:  Shaking chills.  You have a temperature by mouth above 102 F (38.9 C), not controlled by medicine.  Chest, back, or muscle pain.  People around you feel you are not acting correctly or are confused.  Shortness of breath or difficulty breathing.  Dizziness and fainting.  You get a rash or develop hives.  You have a decrease in urine output.  Your urine turns a dark color or changes to pink, red, or brown. Any of the following symptoms occur over the next 10 days:  You have a temperature by mouth above 102 F (38.9 C), not controlled by medicine.  Shortness of breath.  Weakness after normal activity.  The white part of the eye turns yellow (jaundice).  You have a decrease in the amount of urine or are urinating less often.  Your urine turns a dark color or changes to pink, red, or brown. Document Released: 04/14/2000 Document Revised: 07/10/2011 Document Reviewed: 12/02/2007 [facility] Patient Information 2014 [place], [place].  _______________________________________________________________________

## 2014-02-10 ENCOUNTER — Encounter (HOSPITAL_COMMUNITY): Payer: Self-pay

## 2014-02-10 ENCOUNTER — Ambulatory Visit (HOSPITAL_COMMUNITY)
Admission: RE | Admit: 2014-02-10 | Discharge: 2014-02-10 | Disposition: A | Discharge status: Home / Self Care | Payer: Medicare Other | Source: Ambulatory Visit | Attending: Anesthesiology | Admitting: Anesthesiology

## 2014-02-10 ENCOUNTER — Other Ambulatory Visit: Payer: Self-pay | Admitting: Surgical

## 2014-02-10 ENCOUNTER — Encounter (INDEPENDENT_AMBULATORY_CARE_PROVIDER_SITE_OTHER): Payer: Self-pay

## 2014-02-10 ENCOUNTER — Encounter (HOSPITAL_COMMUNITY)
Admission: RE | Admit: 2014-02-10 | Discharge: 2014-02-10 | Disposition: A | Discharge status: Home / Self Care | Payer: Medicare Other | Source: Ambulatory Visit | Attending: Orthopedic Surgery | Admitting: Orthopedic Surgery

## 2014-02-10 ENCOUNTER — Ambulatory Visit (HOSPITAL_COMMUNITY)
Admission: RE | Admit: 2014-02-10 | Discharge: 2014-02-10 | Disposition: A | Discharge status: Home / Self Care | Payer: Medicare Other | Source: Ambulatory Visit | Attending: Orthopedic Surgery | Admitting: Orthopedic Surgery

## 2014-02-10 DIAGNOSIS — R0602 Shortness of breath: Secondary | ICD-10-CM | POA: Insufficient documentation

## 2014-02-10 DIAGNOSIS — I10 Essential (primary) hypertension: Secondary | ICD-10-CM

## 2014-02-10 DIAGNOSIS — Z01818 Encounter for other preprocedural examination: Secondary | ICD-10-CM | POA: Diagnosis present

## 2014-02-10 DIAGNOSIS — M169 Osteoarthritis of hip, unspecified: Secondary | ICD-10-CM | POA: Insufficient documentation

## 2014-02-10 LAB — COMPREHENSIVE METABOLIC PANEL
ALT: 9 U/L (ref 0–35)
ANION GAP: 12 (ref 5–15)
AST: 19 U/L (ref 0–37)
Albumin: 3.5 g/dL (ref 3.5–5.2)
Alkaline Phosphatase: 106 U/L (ref 39–117)
BUN: 10 mg/dL (ref 6–23)
CALCIUM: 10.4 mg/dL (ref 8.4–10.5)
CO2: 26 meq/L (ref 19–32)
Chloride: 98 mEq/L (ref 96–112)
Creatinine, Ser: 0.92 mg/dL (ref 0.50–1.10)
GFR, EST AFRICAN AMERICAN: 69 mL/min — AB (ref >90)
GFR, EST NON AFRICAN AMERICAN: 59 mL/min — AB (ref >90)
GLUCOSE: 86 mg/dL (ref 70–99)
Potassium: 4.5 mEq/L (ref 3.7–5.3)
SODIUM: 136 meq/L — AB (ref 137–147)
Total Bilirubin: 0.2 mg/dL — ABNORMAL LOW (ref 0.3–1.2)
Total Protein: 8.2 g/dL (ref 6.0–8.3)

## 2014-02-10 LAB — URINALYSIS, ROUTINE W REFLEX MICROSCOPIC
Bilirubin Urine: NEGATIVE
Glucose, UA: NEGATIVE mg/dL
Hgb urine dipstick: NEGATIVE
Ketones, ur: NEGATIVE mg/dL
LEUKOCYTES UA: NEGATIVE
NITRITE: NEGATIVE
PH: 7 (ref 5.0–8.0)
Protein, ur: NEGATIVE mg/dL
SPECIFIC GRAVITY, URINE: 1.006 (ref 1.005–1.030)
Urobilinogen, UA: 0.2 mg/dL (ref 0.0–1.0)

## 2014-02-10 LAB — CBC
HCT: 29.8 % — ABNORMAL LOW (ref 36.0–46.0)
Hemoglobin: 9.3 g/dL — ABNORMAL LOW (ref 12.0–15.0)
MCH: 22.7 pg — ABNORMAL LOW (ref 26.0–34.0)
MCHC: 31.2 g/dL (ref 30.0–36.0)
MCV: 72.7 fL — AB (ref 78.0–100.0)
Platelets: 315 10*3/uL (ref 150–400)
RBC: 4.1 MIL/uL (ref 3.87–5.11)
RDW: 16.5 % — ABNORMAL HIGH (ref 11.5–15.5)
WBC: 5.2 10*3/uL (ref 4.0–10.5)

## 2014-02-10 LAB — PROTIME-INR
INR: 1.02 (ref 0.00–1.49)
Prothrombin Time: 13.5 seconds (ref 11.6–15.2)

## 2014-02-10 LAB — SURGICAL PCR SCREEN
MRSA, PCR: NEGATIVE
STAPHYLOCOCCUS AUREUS: NEGATIVE

## 2014-02-10 LAB — APTT: aPTT: 30 seconds (ref 24–37)

## 2014-02-10 MED ORDER — TRANEXAMIC ACID 100 MG/ML IV SOLN: 2000.0000 mg | Freq: Once | INTRAVENOUS | Status: DC

## 2014-02-10 NOTE — Progress Notes (Addendum)
Cbc results faxed to dr Wynelle Link by epic Medical clearance note dr Cathlean Cower on chart for 02-18-14 surgery

## 2014-02-10 NOTE — H&P (Signed)
TOTAL HIP ADMISSION H&P  Patient is admitted for left total hip arthroplasty.  Subjective:  Chief Complaint: left hip pain  HPI: Wendy Torres, 73 y.o. female, has a history of pain and functional disability in the left hip(s) due to arthritis and patient has failed non-surgical conservative treatments for greater than 12 weeks to include NSAID's and/or analgesics, corticosteriod injections, use of assistive devices, weight reduction as appropriate and activity modification.  Onset of symptoms was gradual starting 2 years ago with gradually worsening course since that time.The patient noted no past surgery on the left hip(s).  Patient currently rates pain in the left hip at 8 out of 10 with activity. Patient has night pain, worsening of pain with activity and weight bearing, pain that interfers with activities of daily living, pain with passive range of motion and crepitus. Patient has evidence of periarticular osteophytes, joint space narrowing and protrusio deformity by imaging studies. This condition presents safety issues increasing the risk of falls. There is no current active infection.  Patient Active Problem List   Diagnosis Date Noted  . Anxiety state, unspecified 12/03/2013  . Acute posthemorrhagic anemia 12/12/2012  . Cellulitis 11/12/2012  . Constipation 11/12/2012  . Edema 11/12/2012  . Overactive bladder 11/12/2012  . Candida infection of mouth 11/12/2012  . Hypokalemia 11/07/2012  . OA (osteoarthritis) of hip 11/06/2012  . Dysphagia, unspecified(787.20) 10/23/2012  . Chest pain 10/23/2012  . Dizziness 05/15/2012  . Osteoporosis 05/15/2012  . Peripheral edema 05/15/2012  . DJD (degenerative joint disease) of knee 02/15/2012  . Olecranon bursitis of right elbow 11/23/2011  . Cervical radiculopathy 09/30/2011  . Gait disorder 09/04/2011  . Preop exam for internal medicine 04/07/2011  . Preventative health care 08/12/2010  . HIATAL HERNIA 03/01/2010  . PALPITATIONS,  RECURRENT 01/27/2010  . Chronic pain syndrome 02/16/2009  . ANEMIA 01/14/2009  . SYNCOPE 11/05/2008  . INSOMNIA-SLEEP DISORDER-UNSPEC 11/05/2008  . HYPERSOMNIA 08/31/2008  . OVERACTIVE BLADDER 04/14/2008  . HYPERLIPIDEMIA 04/18/2007  . Morbid obesity 12/09/2006  . HYPERTENSION 12/09/2006  . ALLERGIC RHINITIS 12/09/2006  . ASTHMA 12/09/2006  . COPD 12/09/2006  . GERD 12/09/2006  . DEGENERATIVE JOINT DISEASE 12/09/2006  . LOW BACK PAIN 12/09/2006  . OSTEOPOROSIS 12/09/2006  . TRANSIENT ISCHEMIC ATTACK, HX OF 12/09/2006  . HELICOBACTER PYLORI GASTRITIS, HX OF 12/09/2006   Past Medical History  Diagnosis Date  . HYPERLIPIDEMIA 04/18/2007  . Morbid obesity 12/09/2006  . ANEMIA 01/14/2009  . Chronic pain syndrome 02/16/2009  . HYPERTENSION 12/09/2006  . ACUTE URIS OF UNSPECIFIED SITE 10/22/2007  . ALLERGIC RHINITIS 12/09/2006  . ASTHMA 12/09/2006  . COPD 12/09/2006  . Glossitis 01/14/2009  . GERD 12/09/2006  . HIATAL HERNIA 03/01/2010  . OVERACTIVE BLADDER 04/14/2008  . DEGENERATIVE JOINT DISEASE 12/09/2006  . LOW BACK PAIN 12/09/2006  . OSTEOPOROSIS 12/09/2006  . SYNCOPE 11/05/2008  . DIZZINESS 03/30/2010  . INSOMNIA-SLEEP DISORDER-UNSPEC 11/05/2008  . HYPERSOMNIA 08/31/2008  . PALPITATIONS, RECURRENT 01/27/2010  . FREQUENCY, URINARY 03/30/2010  . Nonspecific (abnormal) findings on radiological and other examination of body structure 11/05/2008  . TRANSIENT ISCHEMIC ATTACK, HX OF 12/09/2006  . HELICOBACTER PYLORI GASTRITIS, HX OF 12/09/2006  . ABNORMAL CHEST XRAY 11/05/2008  . DYSPNEA 01/27/2010    with heavy exertion    Past Surgical History  Procedure Laterality Date  . Tubal ligation    . Lumbar disc surgery    . Colonoscopy    . Tonsillectomy      as a child  . Total hip arthroplasty  Right 11/06/2012    Procedure: RIGHT TOTAL HIP ARTHROPLASTY WITH ACETABULAR AUTOGRAFT;  Surgeon: Gearlean Alf, MD;  Location: [facility];  Service: Orthopedics;  Laterality: Right;    Current outpatient  prescriptions: ALPRAZolam (XANAX) 0.25 MG tablet, Take 0.25 mg by mouth 2 (two) times daily as needed for anxiety., Disp: , Rfl: ;   amLODipine (NORVASC) 10 MG tablet, Take 10 mg by mouth every morning., Disp: , Rfl: ;   aspirin 325 MG tablet, Take 325 mg by mouth every morning. , Disp: , Rfl: ;   Carboxymethylcellulose Sodium (REFRESH CELLUVISC OP), Place 1 drop into both eyes at bedtime., Disp: , Rfl:  Cholecalciferol 1000 UNITS TBDP, Take 1 tablet by mouth every morning. , Disp: , Rfl: ;   cloNIDine (CATAPRES) 0.3 MG tablet, Take 0.3 mg by mouth 2 (two) times daily., Disp: , Rfl: ;  furosemide (LASIX) 20 MG tablet, Take 20 mg by mouth every morning., Disp: , Rfl: ;   gabapentin (NEURONTIN) 300 MG capsule, Take 300 mg by mouth 3 (three) times daily., Disp: , Rfl:  ibuprofen (ADVIL,MOTRIN) 400 MG tablet, Take 400 mg by mouth every morning., Disp: , Rfl: ;   losartan (COZAAR) 50 MG tablet, Take 50 mg by mouth every morning. , Disp: , Rfl: ;   lovastatin (MEVACOR) 40 MG tablet, Take 40 mg by mouth at bedtime., Disp: , Rfl: ;   omeprazole (PRILOSEC) 20 MG capsule, Take 40 mg by mouth every morning. , Disp: , Rfl: ;   oxybutynin (DITROPAN) 5 MG tablet, Take 5 mg by mouth 3 (three) times daily., Disp: , Rfl:  Polyethyl Glycol-Propyl Glycol (SYSTANE OP), Place 1 drop into both eyes. Three to four times daily., Disp: , Rfl: ;   traZODone (DESYREL) 50 MG tablet, Take 25-50 mg by mouth at bedtime as needed for sleep. , Disp: , Rfl: ;    No Known Allergies  History  Substance Use Topics  . Smoking status: Former Smoker -- 0.50 packs/day for 25 years    Types: Cigarettes    Quit date: 10/29/1972  . Smokeless tobacco: Never Used  . Alcohol Use: No    Family History  Problem Relation Age of Onset  . Heart disease Mother     Had pacemaker  . Cancer Father     Lung  . Hypertension Brother      Review of Systems  Constitutional: Negative.   HENT: Negative.   Eyes: Negative.   Respiratory:  Positive for shortness of breath. Negative for cough, hemoptysis, sputum production and wheezing.        SOB with exertion  Cardiovascular: Negative.   Gastrointestinal: Negative for heartburn, nausea, vomiting, abdominal pain, diarrhea, constipation, blood in stool and melena.       Difficulty swallowing  Genitourinary: Positive for frequency. Negative for dysuria, urgency, hematuria and flank pain.  Musculoskeletal: Positive for back pain and joint pain. Negative for falls, myalgias and neck pain.       Left hip pain  Skin: Positive for rash. Negative for itching.       Eczema   Neurological: Negative.   Endo/Heme/Allergies: Negative.   Psychiatric/Behavioral: Negative.     Objective:  Physical Exam  Constitutional: She is oriented to person, place, and time. She appears well-developed. No distress.  Obese  HENT:  Head: Normocephalic and atraumatic.  Right Ear: External ear normal.  Left Ear: External ear normal.  Nose: Nose normal.  Mouth/Throat: Oropharynx is clear and moist.  Eyes: Conjunctivae and  EOM are normal.  Neck: Normal range of motion. Neck supple.  Cardiovascular: Normal rate, regular rhythm, normal heart sounds and intact distal pulses.   No murmur heard. Respiratory: Effort normal. No respiratory distress. She has decreased breath sounds. She has no wheezes.  GI: Soft. Bowel sounds are normal. She exhibits no distension. There is no tenderness.  Musculoskeletal:       Right hip: Normal.       Left hip: She exhibits decreased range of motion, decreased strength and crepitus.       Right knee: She exhibits decreased range of motion and swelling. She exhibits no effusion and no erythema. Tenderness found. Medial joint line and lateral joint line tenderness noted.       Left knee: She exhibits decreased range of motion. She exhibits no swelling, no effusion and no erythema. No tenderness found.       Right lower leg: She exhibits no tenderness and no swelling.        Left lower leg: She exhibits no tenderness and no swelling.  Her right hip can be flexed to 100, rotate in 30, out 40 and abduct 40 without pain on range of motion of the right hip. The left hip flexion 90 no internal or external rotation, abut 10 degrees abduction.  Neurological: She is alert and oriented to person, place, and time. She has normal strength and normal reflexes. No sensory deficit.  Skin: No rash noted. She is not diaphoretic. No erythema.  Psychiatric: She has a normal mood and affect. Her behavior is normal.    Vital signs in last 24 hours: Temp:  [98.3 F (36.8 C)] 98.3 F (36.8 C) (10/13 1042) Pulse Rate:  [67] 67 (10/13 1042) Resp:  [16] 16 (10/13 1042) BP: (149)/(64) 149/64 mmHg (10/13 1042) SpO2:  [97 %] 97 % (10/13 1042) Weight:  [90.81 kg (200 lb 3.2 oz)] 90.81 kg (200 lb 3.2 oz) (10/13 1042)  Labs:   Estimated body mass index is 30.98 kg/(m^2) as calculated from the following:   Height as of 11/06/12: 5\' 4"  (1.626 m).   Weight as of 11/12/12: 81.92 kg (180 lb 9.6 oz).   Imaging Review Plain radiographs demonstrate severe degenerative joint disease of the left hip(s). The bone quality appears to be good for age and reported activity level.  Assessment/Plan:  End stage arthritis, left hip(s)  The patient history, physical examination, clinical judgement of the provider and imaging studies are consistent with end stage degenerative joint disease of the left hip(s) and total hip arthroplasty is deemed medically necessary. The treatment options including medical management, injection therapy, arthroscopy and arthroplasty were discussed at length. The risks and benefits of total hip arthroplasty were presented and reviewed. The risks due to aseptic loosening, infection, stiffness, dislocation/subluxation,  thromboembolic complications and other imponderables were discussed.  The patient acknowledged the explanation, agreed to proceed with the plan and consent was  signed. Patient is being admitted for inpatient treatment for surgery, pain control, PT, OT, prophylactic antibiotics, VTE prophylaxis, progressive ambulation and ADL's and discharge planning.The patient is planning to be discharged to skilled nursing facility [facility])   Topical TXA  PCP: Dr. Cathlean Cower   Ardeen Jourdain, PA-C

## 2014-02-13 ENCOUNTER — Other Ambulatory Visit: Payer: Self-pay | Admitting: Orthopedic Surgery

## 2014-02-13 NOTE — Progress Notes (Signed)
NEW UPDATED Preoperative surgical orders have been place into the Epic hospital system for [employer] on 02/13/2014, 2:06 PM  by Mickel Crow for surgery on 02-18-2013.  New orders for [facility] for two units of blood placed into computer and should be ran upon arrival the day of surgery due to the fact patient will MOST LIKELY need the blood during surgery.  Arlee Muslim, PA-C

## 2014-02-17 ENCOUNTER — Ambulatory Visit: Payer: Self-pay | Admitting: Orthopedic Surgery

## 2014-02-18 ENCOUNTER — Encounter (HOSPITAL_COMMUNITY): Payer: Self-pay

## 2014-02-18 ENCOUNTER — Inpatient Hospital Stay (HOSPITAL_COMMUNITY): Payer: Medicare Other

## 2014-02-18 ENCOUNTER — Inpatient Hospital Stay (HOSPITAL_COMMUNITY): Payer: Medicare Other | Admitting: Anesthesiology

## 2014-02-18 ENCOUNTER — Encounter (HOSPITAL_COMMUNITY)
Admission: RE | Disposition: A | Discharge status: Skilled Nursing Facility | Payer: Self-pay | Source: Ambulatory Visit | Attending: Orthopedic Surgery

## 2014-02-18 ENCOUNTER — Inpatient Hospital Stay (HOSPITAL_COMMUNITY)
Admission: RE | Admit: 2014-02-18 | Discharge: 2014-02-20 | DRG: 470 | Disposition: A | Discharge status: Skilled Nursing Facility | Payer: Medicare Other | Source: Ambulatory Visit | Attending: Orthopedic Surgery | Admitting: Orthopedic Surgery

## 2014-02-18 ENCOUNTER — Encounter (HOSPITAL_COMMUNITY): Payer: Medicare Other | Admitting: Anesthesiology

## 2014-02-18 DIAGNOSIS — G894 Chronic pain syndrome: Secondary | ICD-10-CM | POA: Diagnosis present

## 2014-02-18 DIAGNOSIS — Z809 Family history of malignant neoplasm, unspecified: Secondary | ICD-10-CM

## 2014-02-18 DIAGNOSIS — Z791 Long term (current) use of non-steroidal anti-inflammatories (NSAID): Secondary | ICD-10-CM | POA: Diagnosis not present

## 2014-02-18 DIAGNOSIS — Z79899 Other long term (current) drug therapy: Secondary | ICD-10-CM | POA: Diagnosis not present

## 2014-02-18 DIAGNOSIS — E785 Hyperlipidemia, unspecified: Secondary | ICD-10-CM | POA: Diagnosis present

## 2014-02-18 DIAGNOSIS — Z87891 Personal history of nicotine dependence: Secondary | ICD-10-CM

## 2014-02-18 DIAGNOSIS — Z7982 Long term (current) use of aspirin: Secondary | ICD-10-CM | POA: Diagnosis not present

## 2014-02-18 DIAGNOSIS — M1612 Unilateral primary osteoarthritis, left hip: Secondary | ICD-10-CM | POA: Diagnosis present

## 2014-02-18 DIAGNOSIS — Z96641 Presence of right artificial hip joint: Secondary | ICD-10-CM | POA: Diagnosis present

## 2014-02-18 DIAGNOSIS — J449 Chronic obstructive pulmonary disease, unspecified: Secondary | ICD-10-CM | POA: Diagnosis present

## 2014-02-18 DIAGNOSIS — Z8249 Family history of ischemic heart disease and other diseases of the circulatory system: Secondary | ICD-10-CM

## 2014-02-18 DIAGNOSIS — F411 Generalized anxiety disorder: Secondary | ICD-10-CM | POA: Diagnosis present

## 2014-02-18 DIAGNOSIS — I10 Essential (primary) hypertension: Secondary | ICD-10-CM | POA: Diagnosis present

## 2014-02-18 DIAGNOSIS — Z6834 Body mass index (BMI) 34.0-34.9, adult: Secondary | ICD-10-CM | POA: Diagnosis not present

## 2014-02-18 DIAGNOSIS — M810 Age-related osteoporosis without current pathological fracture: Secondary | ICD-10-CM | POA: Diagnosis present

## 2014-02-18 DIAGNOSIS — K219 Gastro-esophageal reflux disease without esophagitis: Secondary | ICD-10-CM | POA: Diagnosis present

## 2014-02-18 DIAGNOSIS — Z8673 Personal history of transient ischemic attack (TIA), and cerebral infarction without residual deficits: Secondary | ICD-10-CM

## 2014-02-18 DIAGNOSIS — M25552 Pain in left hip: Secondary | ICD-10-CM | POA: Diagnosis present

## 2014-02-18 DIAGNOSIS — M169 Osteoarthritis of hip, unspecified: Secondary | ICD-10-CM | POA: Diagnosis present

## 2014-02-18 DIAGNOSIS — N3281 Overactive bladder: Secondary | ICD-10-CM | POA: Diagnosis present

## 2014-02-18 DIAGNOSIS — M25752 Osteophyte, left hip: Secondary | ICD-10-CM | POA: Diagnosis present

## 2014-02-18 DIAGNOSIS — J45909 Unspecified asthma, uncomplicated: Secondary | ICD-10-CM | POA: Diagnosis present

## 2014-02-18 HISTORY — PX: TOTAL HIP ARTHROPLASTY: SHX124

## 2014-02-18 LAB — PREPARE RBC (CROSSMATCH)

## 2014-02-18 SURGERY — ARTHROPLASTY, HIP, TOTAL,POSTERIOR APPROACH
Anesthesia: Spinal | Site: Hip | Laterality: Left

## 2014-02-18 MED ORDER — PROPOFOL 10 MG/ML IV BOLUS
INTRAVENOUS | Status: AC
  Filled 2014-02-18: qty 20

## 2014-02-18 MED ORDER — FENTANYL CITRATE 0.05 MG/ML IJ SOLN
INTRAMUSCULAR | Status: AC
  Filled 2014-02-18: qty 2

## 2014-02-18 MED ORDER — ONDANSETRON HCL 4 MG/2ML IJ SOLN: 4.0000 mg | Freq: Four times a day (QID) | INTRAMUSCULAR | Status: DC | PRN

## 2014-02-18 MED ORDER — ACETAMINOPHEN 500 MG PO TABS
1000.0000 mg | ORAL_TABLET | Freq: Four times a day (QID) | ORAL | Status: AC
  Administered 2014-02-18 – 2014-02-19 (×4): 1000 mg via ORAL
  Filled 2014-02-18 (×5): qty 2

## 2014-02-18 MED ORDER — PROPOFOL INFUSION 10 MG/ML OPTIME
INTRAVENOUS | Status: DC | PRN
  Administered 2014-02-18: 70 ug/kg/min via INTRAVENOUS

## 2014-02-18 MED ORDER — MIDAZOLAM HCL 5 MG/5ML IJ SOLN
INTRAMUSCULAR | Status: DC | PRN
  Administered 2014-02-18: 2 mg via INTRAVENOUS

## 2014-02-18 MED ORDER — BUPIVACAINE HCL (PF) 0.5 % IJ SOLN
INTRAMUSCULAR | Status: AC
  Filled 2014-02-18: qty 30

## 2014-02-18 MED ORDER — BUPIVACAINE LIPOSOME 1.3 % IJ SUSP
INTRAMUSCULAR | Status: DC | PRN
  Administered 2014-02-18: 20 mL

## 2014-02-18 MED ORDER — DEXAMETHASONE SODIUM PHOSPHATE 10 MG/ML IJ SOLN
10.0000 mg | Freq: Once | INTRAMUSCULAR | Status: AC
  Administered 2014-02-19: 10 mg via INTRAVENOUS
  Filled 2014-02-18: qty 1

## 2014-02-18 MED ORDER — SODIUM CHLORIDE 0.9 % IV SOLN: INTRAVENOUS | Status: DC

## 2014-02-18 MED ORDER — CLONIDINE HCL 0.3 MG PO TABS
0.3000 mg | ORAL_TABLET | Freq: Two times a day (BID) | ORAL | Status: DC
  Administered 2014-02-18 – 2014-02-20 (×4): 0.3 mg via ORAL
  Filled 2014-02-18 (×5): qty 1

## 2014-02-18 MED ORDER — TRANEXAMIC ACID 100 MG/ML IV SOLN
2000.0000 mg | Freq: Once | INTRAVENOUS | Status: DC
  Filled 2014-02-18: qty 20

## 2014-02-18 MED ORDER — ACETAMINOPHEN 10 MG/ML IV SOLN
1000.0000 mg | Freq: Once | INTRAVENOUS | Status: AC
  Administered 2014-02-18: 1000 mg via INTRAVENOUS
  Filled 2014-02-18: qty 100

## 2014-02-18 MED ORDER — BUPIVACAINE HCL 0.25 % IJ SOLN
INTRAMUSCULAR | Status: DC | PRN
  Administered 2014-02-18: 20 mL

## 2014-02-18 MED ORDER — ACETAMINOPHEN 650 MG RE SUPP: 650.0000 mg | Freq: Four times a day (QID) | RECTAL | Status: DC | PRN

## 2014-02-18 MED ORDER — PANTOPRAZOLE SODIUM 40 MG PO TBEC
80.0000 mg | DELAYED_RELEASE_TABLET | Freq: Every day | ORAL | Status: DC
  Filled 2014-02-18: qty 2

## 2014-02-18 MED ORDER — FENTANYL CITRATE 0.05 MG/ML IJ SOLN
INTRAMUSCULAR | Status: DC | PRN
  Administered 2014-02-18: 100 ug via INTRAVENOUS

## 2014-02-18 MED ORDER — FLEET ENEMA 7-19 GM/118ML RE ENEM: 1.0000 | ENEMA | Freq: Once | RECTAL | Status: AC | PRN

## 2014-02-18 MED ORDER — POLYETHYLENE GLYCOL 3350 17 G PO PACK: 17.0000 g | PACK | Freq: Every day | ORAL | Status: DC | PRN

## 2014-02-18 MED ORDER — ACETAMINOPHEN 325 MG PO TABS: 650.0000 mg | ORAL_TABLET | Freq: Four times a day (QID) | ORAL | Status: DC | PRN

## 2014-02-18 MED ORDER — LACTATED RINGERS IV SOLN
INTRAVENOUS | Status: DC | PRN
  Administered 2014-02-18 (×2): via INTRAVENOUS

## 2014-02-18 MED ORDER — LOSARTAN POTASSIUM 50 MG PO TABS
50.0000 mg | ORAL_TABLET | Freq: Every day | ORAL | Status: DC
  Administered 2014-02-19 – 2014-02-20 (×2): 50 mg via ORAL
  Filled 2014-02-18 (×2): qty 1

## 2014-02-18 MED ORDER — SODIUM CHLORIDE 0.9 % IJ SOLN
INTRAMUSCULAR | Status: AC
  Filled 2014-02-18: qty 10

## 2014-02-18 MED ORDER — BUPIVACAINE HCL (PF) 0.5 % IJ SOLN
INTRAMUSCULAR | Status: DC | PRN
  Administered 2014-02-18: 3 mL

## 2014-02-18 MED ORDER — TRANEXAMIC ACID 100 MG/ML IV SOLN
2000.0000 mg | INTRAVENOUS | Status: DC | PRN
  Administered 2014-02-18: 2000 mg via TOPICAL

## 2014-02-18 MED ORDER — RIVAROXABAN 10 MG PO TABS
10.0000 mg | ORAL_TABLET | Freq: Every day | ORAL | Status: DC
  Administered 2014-02-19 – 2014-02-20 (×2): 10 mg via ORAL
  Filled 2014-02-18 (×3): qty 1

## 2014-02-18 MED ORDER — CEFAZOLIN SODIUM-DEXTROSE 2-3 GM-% IV SOLR
2.0000 g | INTRAVENOUS | Status: AC
  Administered 2014-02-18: 2 g via INTRAVENOUS

## 2014-02-18 MED ORDER — DIPHENHYDRAMINE HCL 12.5 MG/5ML PO ELIX: 12.5000 mg | ORAL_SOLUTION | ORAL | Status: DC | PRN

## 2014-02-18 MED ORDER — MORPHINE SULFATE 10 MG/ML IJ SOLN: 1.0000 mg | INTRAMUSCULAR | Status: DC | PRN

## 2014-02-18 MED ORDER — METHOCARBAMOL 500 MG PO TABS
500.0000 mg | ORAL_TABLET | Freq: Four times a day (QID) | ORAL | Status: DC | PRN
  Administered 2014-02-18 – 2014-02-20 (×7): 500 mg via ORAL
  Filled 2014-02-18 (×7): qty 1

## 2014-02-18 MED ORDER — SODIUM CHLORIDE 0.9 % IJ SOLN
INTRAMUSCULAR | Status: DC | PRN
  Administered 2014-02-18: 30 mL

## 2014-02-18 MED ORDER — LIDOCAINE HCL (CARDIAC) 20 MG/ML IV SOLN
INTRAVENOUS | Status: AC
  Filled 2014-02-18: qty 5

## 2014-02-18 MED ORDER — METOCLOPRAMIDE HCL 5 MG/ML IJ SOLN: 5.0000 mg | Freq: Three times a day (TID) | INTRAMUSCULAR | Status: DC | PRN

## 2014-02-18 MED ORDER — ONDANSETRON HCL 4 MG PO TABS: 4.0000 mg | ORAL_TABLET | Freq: Four times a day (QID) | ORAL | Status: DC | PRN

## 2014-02-18 MED ORDER — DEXTROSE-NACL 5-0.9 % IV SOLN
INTRAVENOUS | Status: DC
  Administered 2014-02-18 – 2014-02-19 (×2): via INTRAVENOUS

## 2014-02-18 MED ORDER — PHENOL 1.4 % MT LIQD
1.0000 | OROMUCOSAL | Status: DC | PRN
  Filled 2014-02-18: qty 177

## 2014-02-18 MED ORDER — TRAMADOL HCL 50 MG PO TABS: 50.0000 mg | ORAL_TABLET | Freq: Four times a day (QID) | ORAL | Status: DC | PRN

## 2014-02-18 MED ORDER — PROMETHAZINE HCL 25 MG/ML IJ SOLN: 6.2500 mg | INTRAMUSCULAR | Status: DC | PRN

## 2014-02-18 MED ORDER — MIDAZOLAM HCL 2 MG/2ML IJ SOLN
INTRAMUSCULAR | Status: AC
  Filled 2014-02-18: qty 2

## 2014-02-18 MED ORDER — DEXAMETHASONE SODIUM PHOSPHATE 10 MG/ML IJ SOLN
10.0000 mg | Freq: Once | INTRAMUSCULAR | Status: AC
  Administered 2014-02-18: 10 mg via INTRAVENOUS

## 2014-02-18 MED ORDER — CEFAZOLIN SODIUM-DEXTROSE 2-3 GM-% IV SOLR
INTRAVENOUS | Status: AC
  Filled 2014-02-18: qty 50

## 2014-02-18 MED ORDER — EPHEDRINE SULFATE 50 MG/ML IJ SOLN
INTRAMUSCULAR | Status: DC | PRN
  Administered 2014-02-18 (×2): 10 mg via INTRAVENOUS
  Administered 2014-02-18: 5 mg via INTRAVENOUS

## 2014-02-18 MED ORDER — MEPERIDINE HCL 50 MG/ML IJ SOLN: 6.2500 mg | INTRAMUSCULAR | Status: DC | PRN

## 2014-02-18 MED ORDER — DOCUSATE SODIUM 100 MG PO CAPS
100.0000 mg | ORAL_CAPSULE | Freq: Two times a day (BID) | ORAL | Status: DC
  Administered 2014-02-18 – 2014-02-20 (×4): 100 mg via ORAL

## 2014-02-18 MED ORDER — OXYCODONE HCL 5 MG PO TABS
5.0000 mg | ORAL_TABLET | ORAL | Status: DC | PRN
  Administered 2014-02-18 – 2014-02-19 (×4): 10 mg via ORAL
  Administered 2014-02-19 (×5): 5 mg via ORAL
  Administered 2014-02-20 (×3): 10 mg via ORAL
  Filled 2014-02-18: qty 2
  Filled 2014-02-18: qty 1
  Filled 2014-02-18 (×2): qty 2
  Filled 2014-02-18: qty 1
  Filled 2014-02-18: qty 2
  Filled 2014-02-18 (×3): qty 1
  Filled 2014-02-18 (×3): qty 2

## 2014-02-18 MED ORDER — BUPIVACAINE HCL (PF) 0.25 % IJ SOLN
INTRAMUSCULAR | Status: AC
  Filled 2014-02-18: qty 30

## 2014-02-18 MED ORDER — TRAZODONE 25 MG HALF TABLET
25.0000 mg | ORAL_TABLET | Freq: Every evening | ORAL | Status: DC | PRN
  Filled 2014-02-18: qty 2

## 2014-02-18 MED ORDER — GABAPENTIN 300 MG PO CAPS
300.0000 mg | ORAL_CAPSULE | Freq: Three times a day (TID) | ORAL | Status: DC
  Administered 2014-02-18 – 2014-02-20 (×6): 300 mg via ORAL
  Filled 2014-02-18 (×9): qty 1

## 2014-02-18 MED ORDER — SODIUM CHLORIDE 0.9 % IJ SOLN
INTRAMUSCULAR | Status: AC
  Filled 2014-02-18: qty 50

## 2014-02-18 MED ORDER — OXYBUTYNIN CHLORIDE 5 MG PO TABS
5.0000 mg | ORAL_TABLET | Freq: Three times a day (TID) | ORAL | Status: DC
  Administered 2014-02-18 – 2014-02-20 (×6): 5 mg via ORAL
  Filled 2014-02-18 (×9): qty 1

## 2014-02-18 MED ORDER — CEFAZOLIN SODIUM-DEXTROSE 2-3 GM-% IV SOLR
2.0000 g | Freq: Four times a day (QID) | INTRAVENOUS | Status: AC
  Administered 2014-02-18 (×2): 2 g via INTRAVENOUS
  Filled 2014-02-18 (×3): qty 50

## 2014-02-18 MED ORDER — METHOCARBAMOL 1000 MG/10ML IJ SOLN
500.0000 mg | Freq: Four times a day (QID) | INTRAVENOUS | Status: DC | PRN
  Filled 2014-02-18: qty 5

## 2014-02-18 MED ORDER — ALPRAZOLAM 0.25 MG PO TABS: 0.2500 mg | ORAL_TABLET | Freq: Two times a day (BID) | ORAL | Status: DC | PRN

## 2014-02-18 MED ORDER — BISACODYL 10 MG RE SUPP: 10.0000 mg | Freq: Every day | RECTAL | Status: DC | PRN

## 2014-02-18 MED ORDER — FUROSEMIDE 20 MG PO TABS
20.0000 mg | ORAL_TABLET | Freq: Every day | ORAL | Status: DC
  Administered 2014-02-19 – 2014-02-20 (×2): 20 mg via ORAL
  Filled 2014-02-18 (×2): qty 1

## 2014-02-18 MED ORDER — MENTHOL 3 MG MT LOZG
1.0000 | LOZENGE | OROMUCOSAL | Status: DC | PRN
  Filled 2014-02-18: qty 9

## 2014-02-18 MED ORDER — BUPIVACAINE LIPOSOME 1.3 % IJ SUSP
20.0000 mL | Freq: Once | INTRAMUSCULAR | Status: DC
  Filled 2014-02-18: qty 20

## 2014-02-18 MED ORDER — EPHEDRINE SULFATE 50 MG/ML IJ SOLN
INTRAMUSCULAR | Status: AC
  Filled 2014-02-18: qty 1

## 2014-02-18 MED ORDER — METOCLOPRAMIDE HCL 10 MG PO TABS: 5.0000 mg | ORAL_TABLET | Freq: Three times a day (TID) | ORAL | Status: DC | PRN

## 2014-02-18 MED ORDER — ONDANSETRON HCL 4 MG/2ML IJ SOLN
INTRAMUSCULAR | Status: DC | PRN
  Administered 2014-02-18: 4 mg via INTRAVENOUS

## 2014-02-18 MED ORDER — AMLODIPINE BESYLATE 10 MG PO TABS
10.0000 mg | ORAL_TABLET | Freq: Every day | ORAL | Status: DC
  Administered 2014-02-19 – 2014-02-20 (×2): 10 mg via ORAL
  Filled 2014-02-18 (×2): qty 1

## 2014-02-18 MED ORDER — FENTANYL CITRATE 0.05 MG/ML IJ SOLN: 25.0000 ug | INTRAMUSCULAR | Status: DC | PRN

## 2014-02-18 SURGICAL SUPPLY — 61 items
BAG SPEC THK2 15X12 ZIP CLS (MISCELLANEOUS)
BAG ZIPLOCK 12X15 (MISCELLANEOUS) IMPLANT
BIT DRILL 2.8X128 (BIT) ×2 IMPLANT
BIT DRILL 2.8X128MM (BIT) ×1
BLADE EXTENDED COATED 6.5IN (ELECTRODE) ×3 IMPLANT
BLADE SAW SAG 73X25 THK (BLADE) ×2
BLADE SAW SGTL 73X25 THK (BLADE) ×1 IMPLANT
BRUSH FEMORAL CANAL (MISCELLANEOUS) ×2 IMPLANT
CAPT HIP MOP CEMENTED ×2 IMPLANT
CEMENT BONE DEPUY (Cement) ×4 IMPLANT
CLOSURE WOUND 1/2 X4 (GAUZE/BANDAGES/DRESSINGS) ×1
DECANTER SPIKE VIAL GLASS SM (MISCELLANEOUS) ×3 IMPLANT
DRAPE INCISE IOBAN 66X45 STRL (DRAPES) ×3 IMPLANT
DRAPE POUCH INSTRU U-SHP 10X18 (DRAPES) ×3 IMPLANT
DRAPE SPLIT 77X100IN (DRAPES) ×6 IMPLANT
DRAPE U-SHAPE 47X51 STRL (DRAPES) ×3 IMPLANT
DRSG ADAPTIC 3X8 NADH LF (GAUZE/BANDAGES/DRESSINGS) ×3 IMPLANT
DRSG MEPILEX BORDER 4X4 (GAUZE/BANDAGES/DRESSINGS) ×3 IMPLANT
DRSG MEPILEX BORDER 4X8 (GAUZE/BANDAGES/DRESSINGS) ×3 IMPLANT
DURAPREP 26ML APPLICATOR (WOUND CARE) ×3 IMPLANT
ELECT REM PT RETURN 9FT ADLT (ELECTROSURGICAL) ×3
ELECTRODE REM PT RTRN 9FT ADLT (ELECTROSURGICAL) ×1 IMPLANT
EVACUATOR 1/8 PVC DRAIN (DRAIN) ×3 IMPLANT
FACESHIELD WRAPAROUND (MASK) ×12 IMPLANT
FACESHIELD WRAPAROUND OR TEAM (MASK) ×4 IMPLANT
GAUZE SPONGE 4X4 12PLY STRL (GAUZE/BANDAGES/DRESSINGS) ×3 IMPLANT
GLOVE BIO SURGEON STRL SZ7.5 (GLOVE) IMPLANT
GLOVE BIO SURGEON STRL SZ8 (GLOVE) ×3 IMPLANT
GLOVE BIOGEL PI IND STRL 8 (GLOVE) ×1 IMPLANT
GLOVE BIOGEL PI INDICATOR 8 (GLOVE) ×2
GLOVE SURG SS PI 6.5 STRL IVOR (GLOVE) IMPLANT
GOWN STRL REUS W/TWL LRG LVL3 (GOWN DISPOSABLE) ×3 IMPLANT
GOWN STRL REUS W/TWL XL LVL3 (GOWN DISPOSABLE) IMPLANT
HANDPIECE INTERPULSE COAX TIP (DISPOSABLE) ×3
IMMOBILIZER KNEE 20 (SOFTGOODS) ×2 IMPLANT
IMMOBILIZER KNEE 20 THIGH 36 (SOFTGOODS) ×1 IMPLANT
KIT BASIN OR (CUSTOM PROCEDURE TRAY) ×3 IMPLANT
MANIFOLD NEPTUNE II (INSTRUMENTS) ×3 IMPLANT
NDL SAFETY ECLIPSE 18X1.5 (NEEDLE) ×2 IMPLANT
NEEDLE HYPO 18GX1.5 SHARP (NEEDLE) ×6
NS IRRIG 1000ML POUR BTL (IV SOLUTION) ×3 IMPLANT
PACK TOTAL JOINT (CUSTOM PROCEDURE TRAY) ×3 IMPLANT
PADDING CAST COTTON 6X4 STRL (CAST SUPPLIES) ×1 IMPLANT
PASSER SUT SWANSON 36MM LOOP (INSTRUMENTS) ×3 IMPLANT
POSITIONER SURGICAL ARM (MISCELLANEOUS) ×3 IMPLANT
PRESSURIZER FEMORAL UNIV (MISCELLANEOUS) ×2 IMPLANT
RESTRICTOR CEMENT SZ 5 C-STEM (Cement) ×2 IMPLANT
SET HNDPC FAN SPRY TIP SCT (DISPOSABLE) IMPLANT
STRIP CLOSURE SKIN 1/2X4 (GAUZE/BANDAGES/DRESSINGS) ×3 IMPLANT
SUT ETHIBOND NAB CT1 #1 30IN (SUTURE) ×6 IMPLANT
SUT MNCRL AB 4-0 PS2 18 (SUTURE) ×3 IMPLANT
SUT VIC AB 2-0 CT1 27 (SUTURE) ×9
SUT VIC AB 2-0 CT1 TAPERPNT 27 (SUTURE) ×3 IMPLANT
SUT VLOC 180 0 24IN GS25 (SUTURE) ×3 IMPLANT
SYR 20CC LL (SYRINGE) ×3 IMPLANT
SYR 50ML LL SCALE MARK (SYRINGE) ×3 IMPLANT
TOWEL OR 17X26 10 PK STRL BLUE (TOWEL DISPOSABLE) ×6 IMPLANT
TOWEL OR NON WOVEN STRL DISP B (DISPOSABLE) ×3 IMPLANT
TOWER CARTRIDGE SMART MIX (DISPOSABLE) ×2 IMPLANT
TRAY FOLEY CATH 14FRSI W/METER (CATHETERS) ×3 IMPLANT
WATER STERILE IRR 1500ML POUR (IV SOLUTION) ×3 IMPLANT

## 2014-02-18 NOTE — Anesthesia Preprocedure Evaluation (Signed)
Anesthesia Evaluation  Patient identified by MRN, date of birth, ID band Patient awake    Reviewed: Allergy & Precautions, H&P , NPO status , Patient's Chart, lab work & pertinent test results  Airway Mallampati: II TM Distance: >3 FB Neck ROM: Full    Dental  (+) Dental Advisory Given, Poor Dentition, Missing   Pulmonary shortness of breath, asthma , COPDformer smoker,  breath sounds clear to auscultation        Cardiovascular hypertension, Pt. on medications Rhythm:Regular Rate:Normal     Neuro/Psych negative neurological ROS  negative psych ROS   GI/Hepatic Neg liver ROS, GERD-  Medicated,  Endo/Other  negative endocrine ROS  Renal/GU negative Renal ROS     Musculoskeletal negative musculoskeletal ROS (+)   Abdominal   Peds  Hematology negative hematology ROS (+) anemia ,   Anesthesia Other Findings   Reproductive/Obstetrics negative OB ROS                           Anesthesia Physical  Anesthesia Plan  ASA: III  Anesthesia Plan: Spinal   Post-op Pain Management:    Induction: Intravenous  Airway Management Planned: Simple Face Mask  Additional Equipment:   Intra-op Plan:   Post-operative Plan:   Informed Consent: I have reviewed the patients History and Physical, chart, labs and discussed the procedure including the risks, benefits and alternatives for the proposed anesthesia with the patient or authorized representative who has indicated his/her understanding and acceptance.   Dental advisory given  Plan Discussed with: CRNA  Anesthesia Plan Comments:         Anesthesia Quick Evaluation

## 2014-02-18 NOTE — Progress Notes (Signed)
Utilization review completed.  

## 2014-02-18 NOTE — Evaluation (Signed)
Physical Therapy Evaluation Patient Details Name: Wendy Torres MRN: [id number] DOB: 08-16-1940 Today's Date: 02/18/2014   History of Present Illness  L THR - post approach  Clinical Impression  Pt s/p L THR presents with decreased L LE strength/ROM, post op pain and posterior THP limiting functional mobility.  Pt would benefit from follow up rehab at SNF level to maximize IND and safety for return home.    Follow Up Recommendations SNF    Equipment Recommendations  None recommended by PT    Recommendations for Other Services OT consult     Precautions / Restrictions Precautions Precautions: Posterior Hip Precaution Booklet Issued: Yes (comment) Precaution Comments: sign hung on wall Restrictions Weight Bearing Restrictions: No Other Position/Activity Restrictions: WBAT      Mobility  Bed Mobility Overal bed mobility: +2 for physical assistance Bed Mobility: Supine to Sit     Supine to sit: Mod assist;+2 for physical assistance     General bed mobility comments: cues for sequence and use of R LE to self assist  Transfers Overall transfer level: Needs assistance Equipment used: Rolling walker (2 wheeled) Transfers: Sit to/from Stand Sit to Stand: Mod assist;+2 physical assistance         General transfer comment: cues for LE management and use of UEs to self assist  Ambulation/Gait Ambulation/Gait assistance: Min assist;Mod assist;+2 physical assistance;+2 safety/equipment Ambulation Distance (Feet): 10 Feet Assistive device: Rolling walker (2 wheeled) Gait Pattern/deviations: Step-to pattern;Decreased step length - right;Decreased step length - left;Shuffle;Trunk flexed Gait velocity: decr   General Gait Details: cues for sequence, posture and position from [employer]            Wheelchair Mobility    Modified Rankin (Stroke Patients Only)       Balance                                             Pertinent Vitals/Pain  Pain Assessment: 0-10 Pain Score: 5  Pain Location: l HIP Pain Descriptors / Indicators: Sore Pain Intervention(s): Limited activity within patient's tolerance;Monitored during session;Premedicated before session;Ice applied    Home Living Family/patient expects to be discharged to:: Skilled nursing facility Living Arrangements: Spouse/significant other                    Prior Function Level of Independence: Independent               Hand Dominance   Dominant Hand: Right    Extremity/Trunk Assessment   Upper Extremity Assessment: Overall WFL for tasks assessed           Lower Extremity Assessment: LLE deficits/detail   LLE Deficits / Details: 2/5 hip strength with AAROM at hip to 80 flex and 10 abd  Cervical / Trunk Assessment: Normal  Communication   Communication: No difficulties  Cognition Arousal/Alertness: Awake/alert Behavior During Therapy: WFL for tasks assessed/performed Overall Cognitive Status: Within Functional Limits for tasks assessed                      General Comments      Exercises Total Joint Exercises Ankle Circles/Pumps: AROM;Both;15 reps;Supine Quad Sets: AROM;Both;10 reps;Supine Heel Slides: AAROM;Left;15 reps;Supine Hip ABduction/ADduction: AAROM;Left;10 reps;Other reps (comment)      Assessment/Plan    PT Assessment Patient needs continued PT services  PT Diagnosis Difficulty walking   PT  Problem List Decreased strength;Decreased range of motion;Decreased activity tolerance;Decreased mobility;Decreased knowledge of use of DME;Pain;Decreased knowledge of precautions  PT Treatment Interventions DME instruction;Gait training;Stair training;Functional mobility training;Therapeutic activities;Therapeutic exercise;Patient/family education   PT Goals (Current goals can be found in the Care Plan section) Acute Rehab PT Goals Patient Stated Goal: Resume previous lifestyle with decreased pain PT Goal Formulation: With  patient Time For Goal Achievement: 02/25/14 Potential to Achieve Goals: Good    Frequency 7X/week   Barriers to discharge        Co-evaluation               End of Session Equipment Utilized During Treatment: Gait belt Activity Tolerance: Patient tolerated treatment well Patient left: in chair;with call bell/phone within reach Nurse Communication: Mobility status         Time: 8850-2774 PT Time Calculation (min): 30 min   Charges:   PT Evaluation $Initial PT Evaluation Tier I: 1 Procedure PT Treatments $Gait Training: 8-22 mins $Therapeutic Exercise: 8-22 mins   PT G Codes:          Ruffus Kamaka 02/18/2014, 5:47 PM

## 2014-02-18 NOTE — Anesthesia Procedure Notes (Signed)
Spinal  Patient location during procedure: OR Staffing Anesthesiologist: CARIGNAN, PETER Performed by: anesthesiologist  Preanesthetic Checklist Completed: patient identified, site marked, surgical consent, pre-op evaluation, timeout performed, IV checked, risks and benefits discussed and monitors and equipment checked Spinal Block Patient position: sitting Prep: Betadine Patient monitoring: heart rate, continuous pulse ox and blood pressure Approach: right paramedian Location: L2-3 Injection technique: single-shot Needle Needle type: Spinocan  Needle gauge: 22 G Needle length: 9 cm Additional Notes Expiration date of kit checked and confirmed. Patient tolerated procedure well, without complications.     

## 2014-02-18 NOTE — Op Note (Signed)
Pre-operative diagnosis-  Osteoarthritis Left hip with protrusio deformity  Post-operative diagnosis- Osteoarthritis  Left hip with protrusio deformity  Procedure   Left   Total Hip Arthroplasty with acetabular autografting  Surgeon- Dione Plover. Virginio Isidore, MD  Assistant- Arlee Muslim, PA-C   Anesthesia Spinal   EBL- 300 ml  Drain Hemovac  Complication-  None  Condition-  PACU - hemodynamically stable.   Brief Clinical Note-  Wendy Torres is a 73 y.o. female with end stage arthritis of her left hip with progressively worsening pain and dysfunction. Pain occurs with activity and rest including pain at night. She has tried analgesics, protected weight bearing and rest without benefit. Pain is too severe to attempt physical therapy. Radiographs demonstrate bone on bone arthritis with subchondral cyst formation and severe protrusio deformity. She presents now for left THA.   Procedure in detail--        The patient is brought into the operating room and placed on the operating table. After successful administration of Spinal  anesthesia, the patient is placed in the Right  lateral decubitus position with the Left side up and held in place with the hip positioner. The lower extremity is isolated from the perineum with plastic drapes and time-out is performed by the surgical team. The lower extremity is then prepped and draped in the usual sterile fashion. A short posterolateral incision is made with a ten blade through the subcutaneous tissue to the level of the fascia lata which is incised in line with the skin incision. The sciatic nerve is palpated and protected and the short external rotators and capsule are isolated from the femur. The hip is then dislocated and the center of the femoral head is marked. A trial prosthesis is placed such that the trial head corresponds to the center of the patients' native femoral head. The resection level is marked on the femoral neck and the resection is made  with an oscillating saw. The femoral head is removed and femoral retractors placed to gain access to the femoral canal.       The canal finder is passed into the femoral canal and the canal is thoroughly irrigated with sterile saline to remove the fatty contents. The lateralizing reamer is then passed into the canal. Broaching is initiated with a size one up to a size 6 broach for the [place] cemented system. Broaching was performed to match the patients' native anteversion. The final broach had excellent purchase. The femur is then retracted anteriorly to gain acetabular exposure.           Acetabular retractors are placed and the labrum and osteophytes are removed, Acetabular reaming is performed to 47  Mm and the acetabular defect is filled with femoral head autograft. The 56  mm Pinnacle acetabular shell is placed in anatomic position with excellent purchase. Additional dome screws were placed . An apex hole eliminator is placed and the permanent 36  mm neutral + 4 [place] liner is placed into the acetabular shell. The high offset neck is placed with the 36 mm + 5 head. The hip is then reduced into the acetabulum.      The hip is reduced with excellent stability with full extension and full external rotation, 70 degrees flexion with 40 degrees adduction and 90 degrees internal rotation and 90 degrees of flexion with 70 degrees of internal rotation. The operative leg is placed on top of the non-operative leg and the leg lengths are found to be equal. The  trials are then removed and the femoral canal is sized for the cement resrictor. The size 5 cement restrictor is placed at the appropriate depth in the femoral canal. The femoral canal is then prepared with pulsatile lavage while the cement is mixed. The canal is dried and once ready for implantation the cement is injected into the canal and pressurized. The size 6  high offset [place] [place] cemented stem is then impacted into the femoral canal matching  native anteversion. All extruded cement is removed. Once the cement is fully hardened, the permanent 36 + 5  mm metal head is impacted onto the femoral neck. The hip is then reduced with the same stability parameters. The operative leg is again placed on top of the non-operative leg and the leg lengths are found to be equal.    The wound is then copiously irrigated with saline solution and the capsule and short external rotators are re-attached to the femur through drill holes with Ethibond suture. The fascia lata is closed over a hemovac drain with #1 vicryl suture and the fascia lata, gluteal muscles and subcutaneous tissues are injected with  Exparel 43ml diluted with saline 15ml,and an additional injection of 20 ml of .25% Marcaine, local anesthetic. The subcutaneous tissues are closed with #1 and2-0 vicryl and the subcuticular layer closed with running 4-0 Monocryl. The drain is hooked to suction, incision cleaned and dried, and steri-srips and a bulky sterile dressing applied. The limb is placed into a knee immobilizer and the patient is awakened and transported to recovery in stable condition.      Please note that a surgical assistant was a medical necessity for this procedure in order to perform it in a safe and expeditious manner. The assistant was necessary to provide retraction to the vital neurovascular structures and to retract and position the limb to allow for anatomic placement of the prosthetic components.  Dione Plover Jeanifer Halliday, MD    02/18/2014, 9:55 AM

## 2014-02-18 NOTE — Anesthesia Postprocedure Evaluation (Signed)
  Anesthesia Post-op Note  Patient: Wendy Torres  Procedure(s) Performed: Procedure(s) (LRB): LEFT TOTAL HIP ARTHROPLASTY (Left)  Patient Location: PACU  Anesthesia Type: Spinal  Level of Consciousness: awake and alert   Airway and Oxygen Therapy: Patient Spontanous Breathing  Post-op Pain: mild  Post-op Assessment: Post-op Vital signs reviewed, Patient's Cardiovascular Status Stable, Respiratory Function Stable, Patent Airway and No signs of Nausea or vomiting  Last Vitals:  Filed Vitals:   02/18/14 1437  BP: 171/73  Pulse: 90  Temp: 36.8 C  Resp: 16    Post-op Vital Signs: stable   Complications: No apparent anesthesia complications

## 2014-02-18 NOTE — Transfer of Care (Signed)
Immediate Anesthesia Transfer of Care Note  Patient: Wendy Torres  Procedure(s) Performed: Procedure(s): LEFT TOTAL HIP ARTHROPLASTY (Left)  Patient Location: PACU  Anesthesia Type:Spinal  Level of Consciousness: awake, alert  and oriented  Airway & Oxygen Therapy: Patient Spontanous Breathing and Patient connected to face mask oxygen  Post-op Assessment: Report given to PACU RN and Post -op Vital signs reviewed and stable  Post vital signs: Reviewed and stable  Complications: No apparent anesthesia complications

## 2014-02-18 NOTE — Interval H&P Note (Signed)
History and Physical Interval Note:  02/18/2014 7:44 AM  Wendy Torres  has presented today for surgery, with the diagnosis of OA OF LEFT HIP  The various methods of treatment have been discussed with the patient and family. After consideration of risks, benefits and other options for treatment, the patient has consented to  Procedure(s): LEFT TOTAL HIP ARTHROPLASTY (Left) as a surgical intervention .  The patient's history has been reviewed, patient examined, no change in status, stable for surgery.  I have reviewed the patient's chart and labs.  Questions were answered to the patient's satisfaction.     Gearlean Alf

## 2014-02-19 ENCOUNTER — Encounter (HOSPITAL_COMMUNITY): Payer: Self-pay | Admitting: Orthopedic Surgery

## 2014-02-19 LAB — BASIC METABOLIC PANEL
Anion gap: 10 (ref 5–15)
BUN: 8 mg/dL (ref 6–23)
CALCIUM: 9.3 mg/dL (ref 8.4–10.5)
CO2: 26 mEq/L (ref 19–32)
Chloride: 102 mEq/L (ref 96–112)
Creatinine, Ser: 0.8 mg/dL (ref 0.50–1.10)
GFR, EST AFRICAN AMERICAN: 82 mL/min — AB (ref >90)
GFR, EST NON AFRICAN AMERICAN: 70 mL/min — AB (ref >90)
GLUCOSE: 137 mg/dL — AB (ref 70–99)
POTASSIUM: 4.1 meq/L (ref 3.7–5.3)
Sodium: 138 mEq/L (ref 137–147)

## 2014-02-19 LAB — CBC
HCT: 25.9 % — ABNORMAL LOW (ref 36.0–46.0)
HEMOGLOBIN: 8.1 g/dL — AB (ref 12.0–15.0)
MCH: 22.8 pg — AB (ref 26.0–34.0)
MCHC: 31.3 g/dL (ref 30.0–36.0)
MCV: 73 fL — ABNORMAL LOW (ref 78.0–100.0)
Platelets: 326 10*3/uL (ref 150–400)
RBC: 3.55 MIL/uL — ABNORMAL LOW (ref 3.87–5.11)
RDW: 16.6 % — AB (ref 11.5–15.5)
WBC: 10.5 10*3/uL (ref 4.0–10.5)

## 2014-02-19 MED ORDER — BISACODYL 10 MG RE SUPP: 10.0000 mg | Freq: Every day | RECTAL | Status: DC | PRN

## 2014-02-19 MED ORDER — OXYCODONE HCL 5 MG PO TABS: 5.0000 mg | ORAL_TABLET | ORAL | Status: DC | PRN

## 2014-02-19 MED ORDER — POLYETHYLENE GLYCOL 3350 17 G PO PACK: 17.0000 g | PACK | Freq: Every day | ORAL | Status: DC | PRN

## 2014-02-19 MED ORDER — NON FORMULARY: 40.0000 mg | Freq: Every morning | Status: DC

## 2014-02-19 MED ORDER — OMEPRAZOLE 20 MG PO CPDR
40.0000 mg | DELAYED_RELEASE_CAPSULE | Freq: Every day | ORAL | Status: DC
  Administered 2014-02-19 – 2014-02-20 (×2): 40 mg via ORAL
  Filled 2014-02-19 (×2): qty 2

## 2014-02-19 MED ORDER — TRAMADOL HCL 50 MG PO TABS: 50.0000 mg | ORAL_TABLET | Freq: Four times a day (QID) | ORAL | Status: DC | PRN

## 2014-02-19 MED ORDER — RIVAROXABAN 10 MG PO TABS: 10.0000 mg | ORAL_TABLET | Freq: Every day | ORAL | Status: DC

## 2014-02-19 MED ORDER — DSS 100 MG PO CAPS: 100.0000 mg | ORAL_CAPSULE | Freq: Two times a day (BID) | ORAL | Status: DC

## 2014-02-19 MED ORDER — ONDANSETRON HCL 4 MG PO TABS: 4.0000 mg | ORAL_TABLET | Freq: Four times a day (QID) | ORAL | Status: DC | PRN

## 2014-02-19 MED ORDER — MORPHINE SULFATE 2 MG/ML IJ SOLN: 1.0000 mg | INTRAMUSCULAR | Status: DC | PRN

## 2014-02-19 MED ORDER — METHOCARBAMOL 500 MG PO TABS: 500.0000 mg | ORAL_TABLET | Freq: Four times a day (QID) | ORAL | Status: DC | PRN

## 2014-02-19 MED ORDER — ACETAMINOPHEN 325 MG PO TABS: 650.0000 mg | ORAL_TABLET | Freq: Four times a day (QID) | ORAL | Status: AC | PRN

## 2014-02-19 NOTE — Discharge Instructions (Addendum)
Dr. Gaynelle Arabian Total Joint Specialist [facility] [address], [place], [place] [address] [phone]   TOTAL HIP REPLACEMENT POSTOPERATIVE DIRECTIONS    Hip Rehabilitation, Guidelines Following Surgery  The results of a hip operation are greatly improved after range of motion and muscle strengthening exercises. Follow all safety measures which are given to protect your hip. If any of these exercises cause increased pain or swelling in your joint, decrease the amount until you are comfortable again. Then slowly increase the exercises. Call your caregiver if you have problems or questions.  HOME CARE INSTRUCTIONS  Most of the following instructions are designed to prevent the dislocation of your new hip.  Remove items at home which could result in a fall. This includes throw rugs or furniture in walking pathways.  Continue medications as instructed at time of discharge.  You may have some home medications which will be placed on hold until you complete the course of blood thinner medication.  You may start showering once you are discharged home but do not submerge the incision under water. Just pat the incision dry and apply a dry gauze dressing on daily. Do not put on socks or shoes without following the instructions of your caregivers.  Sit on high chairs so your hips are not bent more than 90 degrees.  Sit on chairs with arms. Use the chair arms to help push yourself up when arising.  Keep your leg on the side of the operation out in front of you when standing up.  Arrange for the use of a toilet seat elevator so you are not sitting low.  Do not do any exercises or get in any positions that cause your toes to point in (pigeon toed).  Always sleep with a pillow between your legs. Do not lie on your side in sleep with both knees touching the bed.   Walk with walker as instructed.  You may resume a sexual relationship in one month or when given the OK by  your caregiver.  Use walker as long as suggested by your caregivers.  You may put full weight on your legs and walk as much as is comfortable. Avoid periods of inactivity such as sitting longer than an hour when not asleep. This helps prevent blood clots.  You may return to work once you are cleared by Engineer, production.  Do not drive a car for 6 weeks or until released by your surgeon.  Do not drive while taking narcotics.  Wear elastic stockings for three weeks following surgery during the day but you may remove then at night.  Make sure you keep all of your appointments after your operation with all of your doctors and caregivers. You should call the office at the above phone number and make an appointment for approximately two weeks after the date of your surgery. Change the dressing daily and reapply a dry dressing each time. Please pick up a stool softener and laxative for home use as long as you are requiring pain medications.  Continue to use ice on the hip for pain and swelling from surgery. You may notice swelling that will progress down to the foot and ankle.  This is normal after  surgery.  Elevate the leg when you are not up walking on it.   It is important for you to complete the blood thinner medication as prescribed by your doctor.  Continue to use the breathing machine which will help keep your temperature down.  It  is common for your temperature to cycle up and down following surgery, especially at night when you are not up moving around and exerting yourself.  The breathing machine keeps your lungs expanded and your temperature down.  RANGE OF MOTION AND STRENGTHENING EXERCISES  These exercises are designed to help you keep full movement of your hip joint. Follow your caregiver's or physical therapist's instructions. Perform all exercises about fifteen times, three times per day or as directed. Exercise both hips, even if you have had only one joint replacement. These exercises can be  done on a training (exercise) mat, on the floor, on a table or on a bed. Use whatever works the best and is most comfortable for you. Use music or television while you are exercising so that the exercises are a pleasant break in your day. This will make your life better with the exercises acting as a break in routine you can look forward to.  Lying on your back, slowly slide your foot toward your buttocks, raising your knee up off the floor. Then slowly slide your foot back down until your leg is straight again.  Lying on your back spread your legs as far apart as you can without causing discomfort.  Lying on your side, raise your upper leg and foot straight up from the floor as far as is comfortable. Slowly lower the leg and repeat.  Lying on your back, tighten up the muscle in the front of your thigh (quadriceps muscles). You can do this by keeping your leg straight and trying to raise your heel off the floor. This helps strengthen the largest muscle supporting your knee.  Lying on your back, tighten up the muscles of your buttocks both with the legs straight and with the knee bent at a comfortable angle while keeping your heel on the floor.   SKILLED REHAB INSTRUCTIONS: If the patient is transferred to a skilled rehab facility following release from the hospital, a list of the current medications will be sent to the facility for the patient to continue.  When discharged from the skilled rehab facility, please have the facility set up the patient's [place] prior to being released. Also, the skilled facility will be responsible for providing the patient with their medications at time of release from the facility to include their pain medication, the muscle relaxants, and their blood thinner medication. If the patient is still at the rehab facility at time of the two week follow up appointment, the skilled rehab facility will also need to assist the patient in arranging follow up  appointment in our office and any transportation needs.  MAKE SURE YOU:  Understand these instructions.  Will watch your condition.  Will get help right away if you are not doing well or get worse.  Pick up stool softner and laxative for home. Do not submerge incision under water. May shower. Continue to use ice for pain and swelling from surgery. Hip precautions.  Total Hip Protocol.  Take Xarelto for two and a half more weeks, then discontinue Xarelto. Once the patient has completed the blood thinner regimen, then take a 325 mg Aspirin daily for three more weeks.  When discharged from the skilled rehab facility, please have the facility set up the patient's [place] prior to being released.  Also provide the patient with their medications at time of release from the facility to include their pain medication, the muscle relaxants, and their blood thinner medication.  If  the patient is still at the rehab facility at time of follow up appointment, please also assist the patient in arranging follow up appointment in our office and any transportation needs.    Information on my medicine - XARELTO (Rivaroxaban)  This medication education was reviewed with me or my healthcare representative as part of my discharge preparation.  The pharmacist that spoke with me during my hospital stay was:  Angela [facility]  Why was Xarelto prescribed for you? Xarelto was prescribed for you to reduce the risk of blood clots forming after orthopedic surgery. The medical term for these abnormal blood clots is venous thromboembolism (VTE).  What do you need to know about xarelto ? Take your Xarelto ONCE DAILY at the same time every day. You may take it either with or without food.  If you have difficulty swallowing the tablet whole, you may crush it and mix in applesauce just prior to taking your dose.  Take Xarelto exactly as prescribed by your doctor and DO NOT stop taking  Xarelto without talking to the doctor who prescribed the medication.  Stopping without other VTE prevention medication to take the place of Xarelto may increase your risk of developing a clot.  After discharge, you should have regular check-up appointments with your healthcare provider that is prescribing your Xarelto.    What do you do if you miss a dose? If you miss a dose, take it as soon as you remember on the same day then continue your regularly scheduled once daily regimen the next day. Do not take two doses of Xarelto on the same day.   Important Safety Information A possible side effect of Xarelto is bleeding. You should call your healthcare provider right away if you experience any of the following:   Bleeding from an injury or your nose that does not stop.   Unusual colored urine (red or dark brown) or unusual colored stools (red or black).   Unusual bruising for unknown reasons.   A serious fall or if you hit your head (even if there is no bleeding).  Some medicines may interact with Xarelto and might increase your risk of bleeding while on Xarelto. To help avoid this, consult your healthcare provider or pharmacist prior to using any new prescription or non-prescription medications, including herbals, vitamins, non-steroidal anti-inflammatory drugs (NSAIDs) and supplements.  This website has more information on Xarelto: [email or web address]

## 2014-02-19 NOTE — Evaluation (Signed)
Occupational Therapy Evaluation Patient Details Name: Wendy Torres MRN: [id number] DOB: 1940/10/13 Today's Date: 02/19/2014    History of Present Illness L THR - post approach   Clinical Impression    Pt admitted with above.   She requires mod A for BADLs.  Recommend SNF level rehab at discharge.  Will defer all further OT to SNF.  Will sign off.   Follow Up Recommendations  SNF    Equipment Recommendations  None recommended by OT    Recommendations for Other Services       Precautions / Restrictions Precautions Precautions: Posterior Hip Precaution Booklet Issued: Yes (comment) Precaution Comments: sign hung on wall Restrictions Weight Bearing Restrictions: No Other Position/Activity Restrictions: WBAT      Mobility Bed Mobility Overal bed mobility: Needs Assistance Bed Mobility: Supine to Sit     Supine to sit: Min assist;Mod assist     General bed mobility comments: Pt up in recliner  Transfers Overall transfer level: Needs assistance Equipment used: Rolling walker (2 wheeled) Transfers: Sit to/from Stand Sit to Stand: Mod assist         General transfer comment: cues for LE management and use of UEs to self assist    Balance                                            ADL Overall ADL's : Needs assistance/impaired Eating/Feeding: Independent;Sitting   Grooming: Wash/dry hands;Wash/dry face;Oral care;Brushing hair;Set up;Sitting   Upper Body Bathing: Supervision/ safety;Sitting   Lower Body Bathing: Moderate assistance;Sit to/from stand;Adhering to hip precautions   Upper Body Dressing : Minimal assistance;Sitting   Lower Body Dressing: Maximal assistance;Sit to/from stand;Adhering to hip precautions   Toilet Transfer: Moderate assistance;Squat-pivot;BSC   Toileting- Clothing Manipulation and Hygiene: Total assistance;Sit to/from stand       Functional mobility during ADLs: Moderate assistance;Rolling walker General  ADL Comments: Pt and daughter were instructed in use and acquisition of AE for LB ADLs     Vision                     Perception     Praxis      Pertinent Vitals/Pain Pain Assessment: 0-10 Pain Score: 5  Pain Location: Lt. hip Pain Descriptors / Indicators: Aching Pain Intervention(s): Monitored during session;Premedicated before session;Repositioned     Hand Dominance Right   Extremity/Trunk Assessment Upper Extremity Assessment Upper Extremity Assessment: Overall WFL for tasks assessed   Lower Extremity Assessment Lower Extremity Assessment: Defer to PT evaluation   Cervical / Trunk Assessment Cervical / Trunk Assessment: Normal   Communication Communication Communication: No difficulties   Cognition Arousal/Alertness: Awake/alert Behavior During Therapy: WFL for tasks assessed/performed Overall Cognitive Status: Within Functional Limits for tasks assessed                     General Comments       Exercises Exercises: Total Joint     Shoulder Instructions      Home Living Family/patient expects to be discharged to:: Skilled nursing facility                                        Prior Functioning/Environment Level of Independence: Independent  OT Diagnosis: Generalized weakness;Acute pain   OT Problem List: Decreased strength;Decreased activity tolerance;Decreased knowledge of use of DME or AE;Decreased knowledge of precautions;Pain;Obesity   OT Treatment/Interventions:      OT Goals(Current goals can be found in the care plan section) Acute Rehab OT Goals Patient Stated Goal: Regain independence  OT Frequency:     Barriers to D/C: Decreased caregiver support          Co-evaluation              End of Session    Activity Tolerance: Patient tolerated treatment well Patient left: in chair;with call bell/phone within reach;with family/visitor present   Time: 4970-2637 OT Time Calculation  (min): 17 min Charges:  OT General Charges $OT Visit: 1 Procedure OT Evaluation $Initial OT Evaluation Tier I: 1 Procedure OT Treatments $Self Care/Home Management : 8-22 mins G-Codes:    Wendy Torres M 03/19/14, 11:20 AM

## 2014-02-19 NOTE — Progress Notes (Signed)
Physical Therapy Treatment Patient Details Name: Wendy Torres MRN: [id number] DOB: [birth year] Today's Date: 03/14/2014    History of Present Illness L THR - post approach    PT Comments      Follow Up Recommendations  SNF     Equipment Recommendations  None recommended by PT    Recommendations for Other Services OT consult     Precautions / Restrictions Precautions Precautions: Posterior Hip Precaution Booklet Issued: Yes (comment) Precaution Comments: pt recalls 2/3 THP without cues - all THP reviewed Restrictions Weight Bearing Restrictions: No Other Position/Activity Restrictions: WBAT    Mobility  Bed Mobility Overal bed mobility: Needs Assistance Bed Mobility: Sit to Supine       Sit to supine: Min assist;Mod assist   General bed mobility comments: cues for sequence and use of R LE to self assist  Transfers Overall transfer level: Needs assistance Equipment used: Rolling walker (2 wheeled) Transfers: Sit to/from Stand Sit to Stand: Min assist;Mod assist         General transfer comment: cues for LE management and use of UEs to self assist  Ambulation/Gait Ambulation/Gait assistance: Min assist;Mod assist Ambulation Distance (Feet): 58 Feet Assistive device: Rolling walker (2 wheeled) Gait Pattern/deviations: Step-to pattern;Decreased step length - right;Decreased step length - left;Shuffle;Trunk flexed Gait velocity: decr   General Gait Details: cues for sequence, posture, ER on L and position from [employer]    Modified Rankin (Stroke Patients Only)       Balance                                    Cognition Arousal/Alertness: Awake/alert Behavior During Therapy: WFL for tasks assessed/performed Overall Cognitive Status: Within Functional Limits for tasks assessed                      Exercises Total Joint Exercises Ankle Circles/Pumps: AROM;Both;15 reps;Supine     General Comments        Pertinent Vitals/Pain Pain Assessment: 0-10 Pain Score: 5  Pain Location: L hip Pain Descriptors / Indicators: Sore Pain Intervention(s): Limited activity within patient's tolerance;Monitored during session;Ice applied;Premedicated before session    Home Living Family/patient expects to be discharged to:: Skilled nursing facility                    Prior Function Level of Independence: Independent          PT Goals (current goals can now be found in the care plan section) Acute Rehab PT Goals Patient Stated Goal: Regain independence PT Goal Formulation: With patient Time For Goal Achievement: 02/25/14 Potential to Achieve Goals: Good Progress towards PT goals: Progressing toward goals    Frequency  7X/week    PT Plan Current plan remains appropriate    Co-evaluation             End of Session Equipment Utilized During Treatment: Gait belt Activity Tolerance: Patient tolerated treatment well Patient left: in bed;with call bell/phone within reach     Time: 1336-1402 PT Time Calculation (min): 26 min  Charges:  $Gait Training: 23-37 mins                    G Codes:      Jasmaine Rochel 2014/03/14, 2:12 PM

## 2014-02-19 NOTE — Progress Notes (Signed)
Clinical Social Work Department CLINICAL SOCIAL WORK PLACEMENT NOTE 02/19/2014  Patient:  Wendy Torres, Wendy Torres  Account Number:  [id number] Admit date:  02/18/2014  Clinical Social Worker:  Werner Lean, LCSW  Date/time:  02/19/2014 05:57 PM  Clinical Social Work is seeking post-discharge placement for this patient at the following level of care:   SKILLED NURSING   (*CSW will update this form in Epic as items are completed)     Patient/family provided with [place] Department of Clinical Social Work's list of facilities offering this level of care within the geographic area requested by the patient (or if unable, by the patient's family).  02/19/2014  Patient/family informed of their freedom to choose among providers that offer the needed level of care, that participate in Medicare, Medicaid or managed care program needed by the patient, have an available bed and are willing to accept the patient.    Patient/family informed of [facility]' ownership interest in [facility], as well as of the fact that they are under no obligation to receive care at this facility.  PASARR submitted to EDS on 02/19/2014 PASARR number received on 02/19/2014  FL2 transmitted to all facilities in geographic area requested by pt/family on  02/19/2014 FL2 transmitted to all facilities within larger geographic area on   Patient informed that his/her managed care company has contracts with or will negotiate with  certain facilities, including the following:     Patient/family informed of bed offers received:  02/19/2014 Patient chooses bed at [place] Physician recommends and patient chooses bed at    Patient to be transferred to  on   Patient to be transferred to facility by  Patient and family notified of transfer on  Name of family member notified:    The following physician request were entered in Epic:   Additional Comments:   Werner Lean LCSW  [phone]

## 2014-02-19 NOTE — Progress Notes (Signed)
Physical Therapy Treatment Patient Details Name: SHAKIRA LOS MRN: [id number] DOB: 01/01/1941 Today's Date: 03-17-14    History of Present Illness L THR - post approach    PT Comments    Pt motivated and progressing well.  Follow Up Recommendations  SNF     Equipment Recommendations  None recommended by PT    Recommendations for Other Services OT consult     Precautions / Restrictions Precautions Precautions: Posterior Hip Precaution Booklet Issued: Yes (comment) Precaution Comments: sign hung on wall Restrictions Weight Bearing Restrictions: No Other Position/Activity Restrictions: WBAT    Mobility  Bed Mobility Overal bed mobility: Needs Assistance Bed Mobility: Supine to Sit     Supine to sit: Min assist;Mod assist     General bed mobility comments: cues for sequence and use of R LE to self assist  Transfers Overall transfer level: Needs assistance Equipment used: Rolling walker (2 wheeled) Transfers: Sit to/from Stand Sit to Stand: Min assist;Mod assist         General transfer comment: cues for LE management and use of UEs to self assist  Ambulation/Gait Ambulation/Gait assistance: Min assist;Mod assist Ambulation Distance (Feet): 44 Feet Assistive device: Rolling walker (2 wheeled) Gait Pattern/deviations: Step-to pattern;Decreased step length - right;Decreased step length - left;Shuffle;Trunk flexed Gait velocity: decr   General Gait Details: cues for sequence, posture, ER on L and position from [employer]    Modified Rankin (Stroke Patients Only)       Balance                                    Cognition Arousal/Alertness: Awake/alert Behavior During Therapy: WFL for tasks assessed/performed Overall Cognitive Status: Within Functional Limits for tasks assessed                      Exercises Total Joint Exercises Ankle Circles/Pumps: AROM;Both;15 reps;Supine Quad  Sets: AROM;Both;10 reps;Supine Heel Slides: AAROM;Left;Supine;20 reps Hip ABduction/ADduction: AAROM;Left;Supine;15 reps    General Comments        Pertinent Vitals/Pain Pain Assessment: 0-10 Pain Score: 6  Pain Location: L hip Pain Descriptors / Indicators: Sore Pain Intervention(s): Limited activity within patient's tolerance;Monitored during session;Premedicated before session;Ice applied    Home Living                      Prior Function            PT Goals (current goals can now be found in the care plan section) Acute Rehab PT Goals Patient Stated Goal: Resume previous lifestyle with decreased pain PT Goal Formulation: With patient Time For Goal Achievement: 02/25/14 Potential to Achieve Goals: Good Progress towards PT goals: Progressing toward goals    Frequency  7X/week    PT Plan Current plan remains appropriate    Co-evaluation             End of Session Equipment Utilized During Treatment: Gait belt Activity Tolerance: Patient tolerated treatment well Patient left: in chair;with call bell/phone within reach     Time: 0802-0831 PT Time Calculation (min): 29 min  Charges:  $Gait Training: 8-22 mins $Therapeutic Exercise: 8-22 mins                    G Codes:      Quintavius Niebuhr 03-17-14,  8:38 AM

## 2014-02-19 NOTE — Progress Notes (Signed)
Clinical Social Work Department BRIEF PSYCHOSOCIAL ASSESSMENT 02/19/2014  Patient:  Wendy Torres, Wendy Torres     Account Number:  [id number]     Admit date:  02/18/2014  Clinical Social Worker:  Lacie Scotts  Date/Time:  02/19/2014 05:51 PM  Referred by:  Physician  Date Referred:  02/18/2014 Referred for  SNF Placement   Other Referral:   Interview type:  Patient Other interview type:    PSYCHOSOCIAL DATA Living Status:  ALONE Admitted from facility:   Level of care:   Primary support name:  Hassan Rowan Primary support relationship to patient:  CHILD, ADULT Degree of support available:   supportive    CURRENT CONCERNS Current Concerns  Post-Acute Placement   Other Concerns:    SOCIAL WORK ASSESSMENT / PLAN Pt is a 73 yr old female living at home prior to hospitalization. CSW met with pt / family to assist with d/c planning. This is a planned admission. Pt has made prior arrangements to have ST Rehab at [facility] following hospital d/c. CSW has contacted SNF and d/c plans have been confirmed. CSW will continue to follow to assist with d/c planning to SNF.   Assessment/plan status:  Psychosocial Support/Ongoing Assessment of Needs Other assessment/ plan:   Information/referral to community resources:   Insurance coverage for SNF and ambulance transport reviewed.    PATIENT'S/FAMILY'S RESPONSE TO PLAN OF CARE: Pt is motivated to work with PT and is looking forward to having rehab at [facility].     Werner Lean LCSW [phone]

## 2014-02-19 NOTE — Progress Notes (Signed)
   Subjective: 1 Day Post-Op Procedure(s) (LRB): LEFT TOTAL HIP ARTHROPLASTY (Left) Patient reports pain as mild.   Patient seen in rounds by Dr. Wynelle Link. Patient is well, but has had some minor complaints of pain in the hip, requiring pain medications We will start therapy today.  Plan is to go Rehab after hospital stay.  Objective: Vital signs in last 24 hours: Temp:  [97.3 F (36.3 C)-98.9 F (37.2 C)] 98.9 F (37.2 C) (10/22 0542) Pulse Rate:  [64-90] 72 (10/22 0542) Resp:  [12-19] 17 (10/22 0542) BP: (116-176)/(58-73) 152/70 mmHg (10/22 0542) SpO2:  [92 %-100 %] 98 % (10/22 0542)  Intake/Output from previous day:  Intake/Output Summary (Last 24 hours) at 02/19/14 0720 Last data filed at 02/19/14 4128  Gross per 24 hour  Intake   3930 ml  Output   5900 ml  Net  -1970 ml     Labs:  Recent Labs  02/19/14 0438  HGB 8.1*    Recent Labs  02/19/14 0438  WBC 10.5  RBC 3.55*  HCT 25.9*  PLT 326    Recent Labs  02/19/14 0438  NA 138  K 4.1  CL 102  CO2 26  BUN 8  CREATININE 0.80  GLUCOSE 137*  CALCIUM 9.3   No results found for this basename: LABPT, INR,  in the last 72 hours  EXAM General - Patient is Alert, Appropriate and Oriented Extremity - Neurovascular intact Sensation intact distally Dorsiflexion/Plantar flexion intact Dressing - dressing C/D/I Motor Function - intact, moving foot and toes well on exam.  Hemovac pulled without difficulty.  Past Medical History  Diagnosis Date  . HYPERLIPIDEMIA 04/18/2007  . Morbid obesity 12/09/2006  . ANEMIA 01/14/2009  . Chronic pain syndrome 02/16/2009  . HYPERTENSION 12/09/2006  . ACUTE URIS OF UNSPECIFIED SITE 10/22/2007  . ALLERGIC RHINITIS 12/09/2006  . ASTHMA 12/09/2006  . COPD 12/09/2006  . Glossitis 01/14/2009  . GERD 12/09/2006  . HIATAL HERNIA 03/01/2010  . OVERACTIVE BLADDER 04/14/2008  . DEGENERATIVE JOINT DISEASE 12/09/2006  . LOW BACK PAIN 12/09/2006  . OSTEOPOROSIS 12/09/2006  . SYNCOPE  11/05/2008  . DIZZINESS 03/30/2010  . INSOMNIA-SLEEP DISORDER-UNSPEC 11/05/2008  . HYPERSOMNIA 08/31/2008  . PALPITATIONS, RECURRENT 01/27/2010  . FREQUENCY, URINARY 03/30/2010  . Nonspecific (abnormal) findings on radiological and other examination of body structure 11/05/2008  . TRANSIENT ISCHEMIC ATTACK, HX OF 12/09/2006  . HELICOBACTER PYLORI GASTRITIS, HX OF 12/09/2006  . ABNORMAL CHEST XRAY 11/05/2008  . DYSPNEA 01/27/2010    with heavy exertion    Assessment/Plan: 1 Day Post-Op Procedure(s) (LRB): LEFT TOTAL HIP ARTHROPLASTY (Left) Active Problems:   OA (osteoarthritis) of hip  Estimated body mass index is 34.35 kg/(m^2) as calculated from the following:   Height as of this encounter: 5\' 4"  (1.626 m).   Weight as of this encounter: 90.804 kg (200 lb 3 oz). Advance diet Up with therapy Plan for discharge tomorrow Discharge to SNF - [facility]  DVT Prophylaxis - Xarelto Weight Bearing As Tolerated left Leg D/C Knee Immobilizer Hemovac Pulled Begin Therapy Hip Preacutions  Arlee Muslim, PA-C Orthopaedic Surgery 02/19/2014, 7:20 AM

## 2014-02-19 NOTE — Care Management Note (Signed)
    Page 1 of 1   02/19/2014     12:55:55 PM CARE MANAGEMENT NOTE 02/19/2014  Patient:  Wendy Torres, Wendy Torres   Account Number:  [id number]  Date Initiated:  02/19/2014  Documentation initiated by:  [facility]  Subjective/Objective Assessment:   adm: LEFT TOTAL HIP ARTHROPLASTY (Left)     Action/Plan:   snf for rehab   Anticipated DC Date:  02/19/2014   Anticipated DC Plan:  [place]  CM consult      Choice offered to / List presented to:             Status of service:  Completed, signed off Medicare Important Message given?   (If response is "NO", the following Medicare IM given date fields will be blank) Date Medicare IM given:   Medicare IM given by:   Date Additional Medicare IM given:   Additional Medicare IM given by:    Discharge Disposition:  [place]  Per UR Regulation:    If discussed at Long Length of Stay Meetings, dates discussed:    Comments:  02/19/14 11:15 Cm met with pt in room to confirm SNF choice for rehab; CSW arranging.  CM called [facility] to make aware.  No other CM needs were communicated.  Mariane Masters, BSN, CM [phone].

## 2014-02-19 NOTE — Discharge Summary (Signed)
Physician Discharge Summary   Patient ID: Wendy Torres MRN: [id number] DOB/AGE: [birth year] 75 y.o.  Admit date: 02/18/2014 Discharge date: 10/23/201   Primary Diagnosis:  Osteoarthritis Left hip with protrusio deformity  Admission Diagnoses:  Past Medical History  Diagnosis Date  . HYPERLIPIDEMIA 04/18/2007  . Morbid obesity 12/09/2006  . ANEMIA 01/14/2009  . Chronic pain syndrome 02/16/2009  . HYPERTENSION 12/09/2006  . ACUTE URIS OF UNSPECIFIED SITE 10/22/2007  . ALLERGIC RHINITIS 12/09/2006  . ASTHMA 12/09/2006  . COPD 12/09/2006  . Glossitis 01/14/2009  . GERD 12/09/2006  . HIATAL HERNIA 03/01/2010  . OVERACTIVE BLADDER 04/14/2008  . DEGENERATIVE JOINT DISEASE 12/09/2006  . LOW BACK PAIN 12/09/2006  . OSTEOPOROSIS 12/09/2006  . SYNCOPE 11/05/2008  . DIZZINESS 03/30/2010  . INSOMNIA-SLEEP DISORDER-UNSPEC 11/05/2008  . HYPERSOMNIA 08/31/2008  . PALPITATIONS, RECURRENT 01/27/2010  . FREQUENCY, URINARY 03/30/2010  . Nonspecific (abnormal) findings on radiological and other examination of body structure 11/05/2008  . TRANSIENT ISCHEMIC ATTACK, HX OF 12/09/2006  . HELICOBACTER PYLORI GASTRITIS, HX OF 12/09/2006  . ABNORMAL CHEST XRAY 11/05/2008  . DYSPNEA 01/27/2010    with heavy exertion   Discharge Diagnoses:   Active Problems:   OA (osteoarthritis) of hip  Estimated body mass index is 34.35 kg/(m^2) as calculated from the following:   Height as of this encounter: '5\' 4"'  (1.626 m).   Weight as of this encounter: 90.804 kg (200 lb 3 oz).  Procedure(s) (LRB): LEFT TOTAL HIP ARTHROPLASTY (Left)   Consults: None  HPI: Wendy Torres is a 73 y.o. female with end stage arthritis of her left hip with progressively worsening pain and dysfunction. Pain occurs with activity and rest including pain at night. She has tried analgesics, protected weight bearing and rest without benefit. Pain is too severe to attempt physical therapy. Radiographs demonstrate bone on bone arthritis with subchondral cyst  formation and severe protrusio deformity. She presents now for left THA.   Laboratory Data: Admission on 02/18/2014  Component Date Value Ref Range Status  . Order Confirmation 02/18/2014 ORDER PROCESSED BY BLOOD BANK   Final  . WBC 02/19/2014 10.5  4.0 - 10.5 K/uL Final  . RBC 02/19/2014 3.55* 3.87 - 5.11 MIL/uL Final  . Hemoglobin 02/19/2014 8.1* 12.0 - 15.0 g/dL Final  . HCT 02/19/2014 25.9* 36.0 - 46.0 % Final  . MCV 02/19/2014 73.0* 78.0 - 100.0 fL Final  . MCH 02/19/2014 22.8* 26.0 - 34.0 pg Final  . MCHC 02/19/2014 31.3  30.0 - 36.0 g/dL Final  . RDW 02/19/2014 16.6* 11.5 - 15.5 % Final  . Platelets 02/19/2014 326  150 - 400 K/uL Final  . Sodium 02/19/2014 138  137 - 147 mEq/L Final  . Potassium 02/19/2014 4.1  3.7 - 5.3 mEq/L Final  . Chloride 02/19/2014 102  96 - 112 mEq/L Final  . CO2 02/19/2014 26  19 - 32 mEq/L Final  . Glucose, Bld 02/19/2014 137* 70 - 99 mg/dL Final  . BUN 02/19/2014 8  6 - 23 mg/dL Final  . Creatinine, Ser 02/19/2014 0.80  0.50 - 1.10 mg/dL Final  . Calcium 02/19/2014 9.3  8.4 - 10.5 mg/dL Final  . GFR calc non Af Amer 02/19/2014 70* >90 mL/min Final  . GFR calc Af Amer 02/19/2014 82* >90 mL/min Final   Comment: (NOTE)                          The eGFR has been calculated using the  CKD EPI equation.                          This calculation has not been validated in all clinical situations.                          eGFR's persistently <90 mL/min signify possible Chronic Kidney                          Disease.  . Anion gap 02/19/2014 10  5 - 15 Final  . WBC 02/20/2014 12.1* 4.0 - 10.5 K/uL Final  . RBC 02/20/2014 3.60* 3.87 - 5.11 MIL/uL Final  . Hemoglobin 02/20/2014 8.2* 12.0 - 15.0 g/dL Final  . HCT 02/20/2014 26.3* 36.0 - 46.0 % Final  . MCV 02/20/2014 73.1* 78.0 - 100.0 fL Final  . MCH 02/20/2014 22.8* 26.0 - 34.0 pg Final  . MCHC 02/20/2014 31.2  30.0 - 36.0 g/dL Final  . RDW 02/20/2014 16.8* 11.5 - 15.5 % Final  . Platelets 02/20/2014 338   150 - 400 K/uL Final  . Sodium 02/20/2014 140  137 - 147 mEq/L Final  . Potassium 02/20/2014 3.7  3.7 - 5.3 mEq/L Final  . Chloride 02/20/2014 102  96 - 112 mEq/L Final  . CO2 02/20/2014 27  19 - 32 mEq/L Final  . Glucose, Bld 02/20/2014 102* 70 - 99 mg/dL Final  . BUN 02/20/2014 11  6 - 23 mg/dL Final  . Creatinine, Ser 02/20/2014 0.76  0.50 - 1.10 mg/dL Final  . Calcium 02/20/2014 9.3  8.4 - 10.5 mg/dL Final  . GFR calc non Af Amer 02/20/2014 80* >90 mL/min Final  . GFR calc Af Amer 02/20/2014 >90  >90 mL/min Final   Comment: (NOTE)                          The eGFR has been calculated using the CKD EPI equation.                          This calculation has not been validated in all clinical situations.                          eGFR's persistently <90 mL/min signify possible Chronic Kidney                          Disease.  Georgiann Hahn gap 02/20/2014 11  5 - 15 Final  Hospital Outpatient Visit on 02/10/2014  Component Date Value Ref Range Status  . aPTT 02/10/2014 30  24 - 37 seconds Final  . WBC 02/10/2014 5.2  4.0 - 10.5 K/uL Final  . RBC 02/10/2014 4.10  3.87 - 5.11 MIL/uL Final  . Hemoglobin 02/10/2014 9.3* 12.0 - 15.0 g/dL Final  . HCT 02/10/2014 29.8* 36.0 - 46.0 % Final  . MCV 02/10/2014 72.7* 78.0 - 100.0 fL Final  . MCH 02/10/2014 22.7* 26.0 - 34.0 pg Final  . MCHC 02/10/2014 31.2  30.0 - 36.0 g/dL Final  . RDW 02/10/2014 16.5* 11.5 - 15.5 % Final  . Platelets 02/10/2014 315  150 - 400 K/uL Final  . Sodium 02/10/2014 136* 137 - 147 mEq/L Final  . Potassium 02/10/2014 4.5  3.7 - 5.3 mEq/L Final  . Chloride  02/10/2014 98  96 - 112 mEq/L Final  . CO2 02/10/2014 26  19 - 32 mEq/L Final  . Glucose, Bld 02/10/2014 86  70 - 99 mg/dL Final  . BUN 02/10/2014 10  6 - 23 mg/dL Final  . Creatinine, Ser 02/10/2014 0.92  0.50 - 1.10 mg/dL Final  . Calcium 02/10/2014 10.4  8.4 - 10.5 mg/dL Final  . Total Protein 02/10/2014 8.2  6.0 - 8.3 g/dL Final  . Albumin 02/10/2014 3.5  3.5 - 5.2  g/dL Final  . AST 02/10/2014 19  0 - 37 U/L Final  . ALT 02/10/2014 9  0 - 35 U/L Final  . Alkaline Phosphatase 02/10/2014 106  39 - 117 U/L Final  . Total Bilirubin 02/10/2014 0.2* 0.3 - 1.2 mg/dL Final  . GFR calc non Af Amer 02/10/2014 59* >90 mL/min Final  . GFR calc Af Amer 02/10/2014 69* >90 mL/min Final   Comment: (NOTE)                          The eGFR has been calculated using the CKD EPI equation.                          This calculation has not been validated in all clinical situations.                          eGFR's persistently <90 mL/min signify possible Chronic Kidney                          Disease.  . Anion gap 02/10/2014 12  5 - 15 Final  . Prothrombin Time 02/10/2014 13.5  11.6 - 15.2 seconds Final  . INR 02/10/2014 1.02  0.00 - 1.49 Final  . ABO/RH(D) 02/10/2014 O POS   Final  . Antibody Screen 02/10/2014 NEG   Final  . Sample Expiration 02/10/2014 02/21/2014   Final  . Unit Number 02/10/2014 [id number]   Final  . Blood Component Type 02/10/2014 RED CELLS,LR   Final  . Unit division 02/10/2014 00   Final  . Status of Unit 02/10/2014 ALLOCATED   Final  . Transfusion Status 02/10/2014 OK TO TRANSFUSE   Final  . Crossmatch Result 02/10/2014 Compatible   Final  . Unit Number 02/10/2014 [id number]   Final  . Blood Component Type 02/10/2014 RED CELLS,LR   Final  . Unit division 02/10/2014 00   Final  . Status of Unit 02/10/2014 ALLOCATED   Final  . Transfusion Status 02/10/2014 OK TO TRANSFUSE   Final  . Crossmatch Result 02/10/2014 Compatible   Final  . Color, Urine 02/10/2014 YELLOW  YELLOW Final  . APPearance 02/10/2014 CLEAR  CLEAR Final  . Specific Gravity, Urine 02/10/2014 1.006  1.005 - 1.030 Final  . pH 02/10/2014 7.0  5.0 - 8.0 Final  . Glucose, UA 02/10/2014 NEGATIVE  NEGATIVE mg/dL Final  . Hgb urine dipstick 02/10/2014 NEGATIVE  NEGATIVE Final  . Bilirubin Urine 02/10/2014 NEGATIVE  NEGATIVE Final  . Ketones, ur 02/10/2014 NEGATIVE  NEGATIVE  mg/dL Final  . Protein, ur 02/10/2014 NEGATIVE  NEGATIVE mg/dL Final  . Urobilinogen, UA 02/10/2014 0.2  0.0 - 1.0 mg/dL Final  . Nitrite 02/10/2014 NEGATIVE  NEGATIVE Final  . Leukocytes, UA 02/10/2014 NEGATIVE  NEGATIVE Final   MICROSCOPIC NOT DONE ON URINES WITH NEGATIVE PROTEIN, BLOOD, LEUKOCYTES, NITRITE, OR  GLUCOSE <1000 mg/dL.  Marland Kitchen MRSA, PCR 02/10/2014 NEGATIVE  NEGATIVE Final  . Staphylococcus aureus 02/10/2014 NEGATIVE  NEGATIVE Final   Comment:                                 The Xpert SA Assay (FDA                          approved for NASAL specimens                          in patients over 91 years of age),                          is one component of                          a comprehensive surveillance                          program.  Test performance has                          been validated by [employer] for patients greater                          than or equal to 34 year old.                          It is not intended                          to diagnose infection nor to                          guide or monitor treatment.     X-Rays:Dg Chest 2 View  02/10/2014   CLINICAL DATA:  Preop for total hip replacement, some shortness of breath with exertion  EXAM: CHEST  2 VIEW  COMPARISON:  Chest x-ray of 11/10/2012  FINDINGS: No active infiltrate or effusion is seen. Mediastinal and hilar contours are unchanged. Moderate cardiomegaly is stable. There are degenerative changes throughout the thoracic spine. Old healed fractures of the posterior right sixth and seventh ribs are again noted.  IMPRESSION: Stable cardiomegaly.  No active lung disease.   Electronically Signed   By: Ivar Drape M.D.   On: 02/10/2014 14:06   Dg Hip Complete Left  02/10/2014   CLINICAL DATA:  Preop for left total hip replacement  EXAM: LEFT HIP - COMPLETE 2+ VIEW  COMPARISON:  Left hip films of 06/08/2010  FINDINGS: The right total hip replacement components are in good  position. Significant degenerative change of the left hip is noted with loss of joint space, sclerosis, and spurring primarily from the superior acetabulum. There is some deformity of the left femoral head which may be due to severe osteoarthritis, but an element of avascular necrosis cannot be excluded. There is a probable old healed fracture of the left inferior pelvic ramus.  IMPRESSION: Significant degenerative joint disease of the left hip with some protrusion. Cannot exclude an element of AVN in view of the irregularity of the left femoral head.   Electronically Signed   By: Ivar Drape M.D.   On: 02/10/2014 14:05   Dg Pelvis Portable  02/18/2014   CLINICAL DATA:  Postop left total hip arthroplasty.  EXAM: PORTABLE PELVIS 1-2 VIEWS  COMPARISON:  02/10/2014  FINDINGS: Sequelae of right total hip arthroplasty are again identified. Sequelae of interval left total hip arthroplasty are identified. The prosthetic components appear normally located on the single AP projection. There is a tiny linear cortical defect involving the superomedial aspect of the left acetabulum, not clearly present on preoperative radiographs. No periprosthetic fracture is identified in the imaged portion of the left femur. A surgical drain and postoperative soft tissue gas are noted about the left hip.  IMPRESSION: Interval left total hip arthroplasty. Question tiny nondisplaced left acetabular fracture.   Electronically Signed   By: Logan Bores   On: 02/18/2014 11:32    EKG: Orders placed during the hospital encounter of 02/10/14  . EKG 12-LEAD  . EKG 12-LEAD     Hospital Course: Patient was admitted to [facility] and taken to the OR and underwent the above state procedure without complications.  Patient tolerated the procedure well and was later transferred to the recovery room and then to the orthopaedic floor for postoperative care.  They were given PO and IV analgesics for pain control following their surgery.   They were given 24 hours of postoperative antibiotics of  Anti-infectives   Start     Dose/Rate Route Frequency Ordered Stop   02/18/14 1400  ceFAZolin (ANCEF) IVPB 2 g/50 mL premix     2 g 100 mL/hr over 30 Minutes Intravenous Every 6 hours 02/18/14 1140 02/18/14 2109   02/18/14 0635  ceFAZolin (ANCEF) IVPB 2 g/50 mL premix     2 g 100 mL/hr over 30 Minutes Intravenous On call to O.R. 02/18/14 3016 02/18/14 0825     and started on DVT prophylaxis in the form of Xarelto.   PT and OT were ordered for total hip protocol.  The patient was allowed to be WBAT with therapy. Discharge planning was consulted to help with postop disposition and equipment needs.  Patient had a tough night on the evening of surgery.  They started to get up OOB with therapy on day one.  Hemovac drain was pulled without difficulty.  The knee immobilizer was removed and discontinued.  Continued to work with therapy into day two.  Dressing was changed on day two and the incision was healing well.  Patient was seen in rounds and was ready to go to [facility].   Diet: Cardiac diet Activity:WBAT No bending hip over 90 degrees- A "L" Angle Do not cross legs Do not let foot roll inward When turning these patients a pillow should be placed between the patient's legs to prevent crossing. Patients should have the affected knee fully extended when trying to sit or stand from all surfaces to prevent excessive hip flexion. When ambulating and turning toward the affected side the affected leg should have the toes turned out prior to moving the walker and the rest of patient's body as to prevent internal rotation/ turning in of the leg. Abduction pillows are the most effective way to prevent a patient from not crossing legs or turning toes in at rest. If an abduction pillow is not ordered placing  a regular pillow length wise between the patient's legs is also an effective reminder. It is imperative that these precautions be maintained so  that the surgical hip does not dislocate. Follow-up:in 2 weeks Disposition - [place] Discharged Condition: good       Discharge Instructions   Call MD / Call 911    Complete by:  As directed   If you experience chest pain or shortness of breath, CALL 911 and be transported to the hospital emergency room.  If you develope a fever above 101 F, pus (white drainage) or increased drainage or redness at the wound, or calf pain, call your surgeon's office.     Change dressing    Complete by:  As directed   You may change your dressing dressing daily with sterile 4 x 4 inch gauze dressing and paper tape.  Do not submerge the incision under water.     Constipation Prevention    Complete by:  As directed   Drink plenty of fluids.  Prune juice may be helpful.  You may use a stool softener, such as Colace (over the counter) 100 mg twice a day.  Use MiraLax (over the counter) for constipation as needed.     Diet - low sodium heart healthy    Complete by:  As directed      Discharge instructions    Complete by:  As directed   Pick up stool softner and laxative for home. Do not submerge incision under water. May shower. Continue to use ice for pain and swelling from surgery. Hip precautions.  Total Hip Protocol.  Take Xarelto for two and a half more weeks, then discontinue Xarelto. Once the patient has completed the blood thinner regimen, then take a 325 mg Aspirin daily for three more weeks.  When discharged from the skilled rehab facility, please have the facility set up the patient's [place] prior to being released.  Also provide the patient with their medications at time of release from the facility to include their pain medication, the muscle relaxants, and their blood thinner medication.  If the patient is still at the rehab facility at time of follow up appointment, please also assist the patient in arranging follow up appointment in our  office and any transportation needs.     Do not sit on low chairs, stoools or toilet seats, as it may be difficult to get up from low surfaces    Complete by:  As directed      Driving restrictions    Complete by:  As directed   No driving until released by the physician.     Follow the hip precautions as taught in Physical Therapy    Complete by:  As directed      Increase activity slowly as tolerated    Complete by:  As directed      Lifting restrictions    Complete by:  As directed   No lifting until released by the physician.     Patient may shower    Complete by:  As directed   You may shower without a dressing once there is no drainage.  Do not wash over the wound.  If drainage remains, do not shower until drainage stops.     TED hose    Complete by:  As directed   Use stockings (TED hose) for 3 weeks on both leg(s).  You may remove them at night for sleeping.  Weight bearing as tolerated    Complete by:  As directed             Medication List    STOP taking these medications       aspirin 325 MG tablet     Cholecalciferol 1000 UNITS Tbdp     ibuprofen 400 MG tablet  Commonly known as:  ADVIL,MOTRIN     REFRESH CELLUVISC OP      TAKE these medications       acetaminophen 325 MG tablet  Commonly known as:  TYLENOL  Take 2 tablets (650 mg total) by mouth every 6 (six) hours as needed for mild pain (or Fever >/= 101).     ALPRAZolam 0.25 MG tablet  Commonly known as:  XANAX  Take 0.25 mg by mouth 2 (two) times daily as needed for anxiety.     amLODipine 10 MG tablet  Commonly known as:  NORVASC  Take 10 mg by mouth every morning.     bisacodyl 10 MG suppository  Commonly known as:  DULCOLAX  Place 1 suppository (10 mg total) rectally daily as needed for moderate constipation.     cloNIDine 0.3 MG tablet  Commonly known as:  CATAPRES  Take 0.3 mg by mouth 2 (two) times daily.     DSS 100 MG Caps  Take 100 mg by mouth 2 (two) times daily.      furosemide 20 MG tablet  Commonly known as:  LASIX  Take 20 mg by mouth every morning.     gabapentin 300 MG capsule  Commonly known as:  NEURONTIN  Take 300 mg by mouth 3 (three) times daily.     losartan 50 MG tablet  Commonly known as:  COZAAR  Take 50 mg by mouth every morning.     lovastatin 40 MG tablet  Commonly known as:  MEVACOR  Take 40 mg by mouth at bedtime.     methocarbamol 500 MG tablet  Commonly known as:  ROBAXIN  Take 1 tablet (500 mg total) by mouth every 6 (six) hours as needed for muscle spasms.     omeprazole 20 MG capsule  Commonly known as:  PRILOSEC  Take 40 mg by mouth every morning.     ondansetron 4 MG tablet  Commonly known as:  ZOFRAN  Take 1 tablet (4 mg total) by mouth every 6 (six) hours as needed for nausea.     oxybutynin 5 MG tablet  Commonly known as:  DITROPAN  Take 5 mg by mouth 3 (three) times daily.     oxyCODONE 5 MG immediate release tablet  Commonly known as:  Oxy IR/ROXICODONE  Take 1-2 tablets (5-10 mg total) by mouth every 3 (three) hours as needed for moderate pain, severe pain or breakthrough pain.     polyethylene glycol packet  Commonly known as:  MIRALAX / GLYCOLAX  Take 17 g by mouth daily as needed for mild constipation.     rivaroxaban 10 MG Tabs tablet  Commonly known as:  XARELTO  - Take 1 tablet (10 mg total) by mouth daily with breakfast. Take Xarelto for two and a half more weeks, then discontinue Xarelto.  - Once the patient has completed the blood thinner regimen, then take a 325 mg Aspirin daily for three more weeks.     SYSTANE OP  Place 1 drop into both eyes. Three to four times daily.     traMADol 50 MG tablet  Commonly known as:  Veatrice [place]  Take 1-2 tablets (50-100 mg total) by mouth every 6 (six) hours as needed (mild pain).     traZODone 50 MG tablet  Commonly known as:  DESYREL  Take 25-50 mg by mouth at bedtime as needed for sleep.       Follow-up Information   Follow up with Gearlean Alf, MD. Schedule an appointment as soon as possible for a visit in 2 weeks. (Call office at [phone] to setup appointment)    Specialty:  Orthopedic Surgery   Contact information:   [address] [place] [address] [phone]       Signed: Arlee Muslim, PA-C Orthopaedic Surgery 02/20/2014, 7:57 AM

## 2014-02-20 ENCOUNTER — Other Ambulatory Visit: Payer: Self-pay

## 2014-02-20 LAB — CBC
HCT: 26.3 % — ABNORMAL LOW (ref 36.0–46.0)
Hemoglobin: 8.2 g/dL — ABNORMAL LOW (ref 12.0–15.0)
MCH: 22.8 pg — AB (ref 26.0–34.0)
MCHC: 31.2 g/dL (ref 30.0–36.0)
MCV: 73.1 fL — AB (ref 78.0–100.0)
PLATELETS: 338 10*3/uL (ref 150–400)
RBC: 3.6 MIL/uL — AB (ref 3.87–5.11)
RDW: 16.8 % — AB (ref 11.5–15.5)
WBC: 12.1 10*3/uL — ABNORMAL HIGH (ref 4.0–10.5)

## 2014-02-20 LAB — BASIC METABOLIC PANEL
ANION GAP: 11 (ref 5–15)
BUN: 11 mg/dL (ref 6–23)
CALCIUM: 9.3 mg/dL (ref 8.4–10.5)
CO2: 27 mEq/L (ref 19–32)
CREATININE: 0.76 mg/dL (ref 0.50–1.10)
Chloride: 102 mEq/L (ref 96–112)
GFR, EST NON AFRICAN AMERICAN: 80 mL/min — AB (ref >90)
Glucose, Bld: 102 mg/dL — ABNORMAL HIGH (ref 70–99)
Potassium: 3.7 mEq/L (ref 3.7–5.3)
SODIUM: 140 meq/L (ref 137–147)

## 2014-02-20 MED ORDER — ALPRAZOLAM 0.25 MG PO TABS: 0.2500 mg | ORAL_TABLET | Freq: Two times a day (BID) | ORAL | Status: DC | PRN

## 2014-02-20 NOTE — Progress Notes (Signed)
Utilization review completed.  

## 2014-02-20 NOTE — Progress Notes (Signed)
Physical Therapy Treatment Patient Details Name: Wendy Torres MRN: [id number] DOB: 1940/10/24 Today's Date: 03-11-2014    History of Present Illness L THR - post approach    PT Comments      Follow Up Recommendations  SNF     Equipment Recommendations  None recommended by PT    Recommendations for Other Services OT consult     Precautions / Restrictions Precautions Precautions: Posterior Hip Precaution Booklet Issued: Yes (comment) Precaution Comments: pt recalls all THP without cues - all THP reviewed Restrictions Weight Bearing Restrictions: No Other Position/Activity Restrictions: WBAT    Mobility  Bed Mobility                  Transfers Overall transfer level: Needs assistance Equipment used: Rolling walker (2 wheeled) Transfers: Sit to/from Stand Sit to Stand: Min assist         General transfer comment: cues for LE management and use of UEs to self assist  Ambulation/Gait Ambulation/Gait assistance: Min assist Ambulation Distance (Feet): 85 Feet Assistive device: Rolling walker (2 wheeled) Gait Pattern/deviations: Step-to pattern;Decreased step length - right;Decreased step length - left;Shuffle;Trunk flexed     General Gait Details: cues for sequence, posture, ER on L and position from [employer]            Wheelchair Mobility    Modified Rankin (Stroke Patients Only)       Balance                                    Cognition Arousal/Alertness: Awake/alert Behavior During Therapy: WFL for tasks assessed/performed Overall Cognitive Status: Within Functional Limits for tasks assessed                      Exercises Total Joint Exercises Ankle Circles/Pumps: AROM;Both;15 reps;Supine Quad Sets: AROM;Both;10 reps;Supine Heel Slides: AAROM;Left;Supine;20 reps Hip ABduction/ADduction: AAROM;Left;Supine;15 reps Long Arc Quad: AROM;Both;15 reps;Seated    General Comments        Pertinent  Vitals/Pain Pain Assessment: 0-10 Pain Score: 4  Pain Location: L hip Pain Descriptors / Indicators: Sore Pain Intervention(s): Limited activity within patient's tolerance;Monitored during session;Premedicated before session;Ice applied    Home Living                      Prior Function            PT Goals (current goals can now be found in the care plan section) Acute Rehab PT Goals Patient Stated Goal: Regain independence PT Goal Formulation: With patient Time For Goal Achievement: 02/25/14 Potential to Achieve Goals: Good Progress towards PT goals: Progressing toward goals    Frequency  7X/week    PT Plan Current plan remains appropriate    Co-evaluation             End of Session Equipment Utilized During Treatment: Gait belt Activity Tolerance: Patient tolerated treatment well Patient left: in chair;with call bell/phone within reach     Time: 0925-0950 PT Time Calculation (min): 25 min  Charges:  $Gait Training: 8-22 mins $Therapeutic Exercise: 8-22 mins                    G Codes:      Sou Nohr 03-11-14, 11:45 AM

## 2014-02-20 NOTE — Progress Notes (Signed)
Clinical Social Work Department CLINICAL SOCIAL WORK PLACEMENT NOTE 02/20/2014  Patient:  Wendy Torres, Wendy Torres  Account Number:  [id number] Admit date:  02/18/2014  Clinical Social Worker:  Werner Lean, LCSW  Date/time:  02/19/2014 05:57 PM  Clinical Social Work is seeking post-discharge placement for this patient at the following level of care:   SKILLED NURSING   (*CSW will update this form in Epic as items are completed)     Patient/family provided with [facility] Department of Clinical Social Work's list of facilities offering this level of care within the geographic area requested by the patient (or if unable, by the patient's family).  02/19/2014  Patient/family informed of their freedom to choose among providers that offer the needed level of care, that participate in Medicare, Medicaid or managed care program needed by the patient, have an available bed and are willing to accept the patient.    Patient/family informed of [facility]' ownership interest in [facility], as well as of the fact that they are under no obligation to receive care at this facility.  PASARR submitted to EDS on 02/19/2014 PASARR number received on 02/19/2014  FL2 transmitted to all facilities in geographic area requested by pt/family on  02/19/2014 FL2 transmitted to all facilities within larger geographic area on   Patient informed that his/her managed care company has contracts with or will negotiate with  certain facilities, including the following:     Patient/family informed of bed offers received:  02/19/2014 Patient chooses bed at [facility] Physician recommends and patient chooses bed at    Patient to be transferred to [place] on  02/20/2014 Patient to be transferred to facility by [facility] Patient and family notified of transfer on 02/20/2014 Name of family member notified:  Daughter  The following physician request were entered in  Epic:   Additional Comments: Pt / daughter are in agreement with d/c to SNF today. [facility] transport needed. NSG reviewed d/c summary, scripts, avs. Scripts included in d/c packet.  Werner Lean LCSW [phone]

## 2014-02-20 NOTE — Progress Notes (Signed)
   Subjective: 2 Days Post-Op Procedure(s) (LRB): LEFT TOTAL HIP ARTHROPLASTY (Left) Patient reports pain as mild.   Patient seen in rounds for Dr. Wynelle Link. Patient is well, and has had no acute complaints or problems Patient is ready to go to [facility]  Objective: Vital signs in last 24 hours: Temp:  [98.7 F (37.1 C)-99.5 F (37.5 C)] 98.8 F (37.1 C) (10/23 0603) Pulse Rate:  [75-81] 75 (10/23 0603) Resp:  [18] 18 (10/23 0603) BP: (155-174)/(63-91) 155/91 mmHg (10/23 0603) SpO2:  [94 %-99 %] 98 % (10/23 0603)  Intake/Output from previous day:  Intake/Output Summary (Last 24 hours) at 02/20/14 0755 Last data filed at 02/20/14 0604  Gross per 24 hour  Intake    720 ml  Output   3570 ml  Net  -2850 ml    Intake/Output this shift:    Labs:  Recent Labs  02/19/14 0438 02/20/14 0446  HGB 8.1* 8.2*    Recent Labs  02/19/14 0438 02/20/14 0446  WBC 10.5 12.1*  RBC 3.55* 3.60*  HCT 25.9* 26.3*  PLT 326 338    Recent Labs  02/19/14 0438 02/20/14 0446  NA 138 140  K 4.1 3.7  CL 102 102  CO2 26 27  BUN 8 11  CREATININE 0.80 0.76  GLUCOSE 137* 102*  CALCIUM 9.3 9.3   No results found for this basename: LABPT, INR,  in the last 72 hours  EXAM: General - Patient is Alert, Appropriate and Oriented Extremity - Neurovascular intact Sensation intact distally Incision - clean, dry, no drainage Motor Function - intact, moving foot and toes well on exam.   Assessment/Plan: 2 Days Post-Op Procedure(s) (LRB): LEFT TOTAL HIP ARTHROPLASTY (Left) Procedure(s) (LRB): LEFT TOTAL HIP ARTHROPLASTY (Left) Past Medical History  Diagnosis Date  . HYPERLIPIDEMIA 04/18/2007  . Morbid obesity 12/09/2006  . ANEMIA 01/14/2009  . Chronic pain syndrome 02/16/2009  . HYPERTENSION 12/09/2006  . ACUTE URIS OF UNSPECIFIED SITE 10/22/2007  . ALLERGIC RHINITIS 12/09/2006  . ASTHMA 12/09/2006  . COPD 12/09/2006  . Glossitis 01/14/2009  . GERD 12/09/2006  . HIATAL HERNIA 03/01/2010   . OVERACTIVE BLADDER 04/14/2008  . DEGENERATIVE JOINT DISEASE 12/09/2006  . LOW BACK PAIN 12/09/2006  . OSTEOPOROSIS 12/09/2006  . SYNCOPE 11/05/2008  . DIZZINESS 03/30/2010  . INSOMNIA-SLEEP DISORDER-UNSPEC 11/05/2008  . HYPERSOMNIA 08/31/2008  . PALPITATIONS, RECURRENT 01/27/2010  . FREQUENCY, URINARY 03/30/2010  . Nonspecific (abnormal) findings on radiological and other examination of body structure 11/05/2008  . TRANSIENT ISCHEMIC ATTACK, HX OF 12/09/2006  . HELICOBACTER PYLORI GASTRITIS, HX OF 12/09/2006  . ABNORMAL CHEST XRAY 11/05/2008  . DYSPNEA 01/27/2010    with heavy exertion   Active Problems:   OA (osteoarthritis) of hip  Estimated body mass index is 34.35 kg/(m^2) as calculated from the following:   Height as of this encounter: 5\' 4"  (1.626 m).   Weight as of this encounter: 90.804 kg (200 lb 3 oz). Discharge to SNF Diet - Cardiac diet Follow up - in 2 weeks Activity - WBAT Disposition - Home Condition Upon Discharge - Good D/C Meds - See DC Summary DVT Prophylaxis - Xarelto  Arlee Muslim, PA-C Orthopaedic Surgery 02/20/2014, 7:55 AM

## 2014-02-20 NOTE — Telephone Encounter (Signed)
RX sent to [facility] @ [phone]. Phone number [phone]  

## 2014-02-22 LAB — TYPE AND SCREEN
ABO/RH(D): O POS
Antibody Screen: NEGATIVE
Unit division: 0
Unit division: 0

## 2014-02-23 ENCOUNTER — Other Ambulatory Visit: Payer: Self-pay | Admitting: Internal Medicine

## 2014-02-25 ENCOUNTER — Non-Acute Institutional Stay (SKILLED_NURSING_FACILITY): Payer: Medicare Other | Admitting: Internal Medicine

## 2014-02-25 DIAGNOSIS — Z96649 Presence of unspecified artificial hip joint: Secondary | ICD-10-CM

## 2014-02-25 DIAGNOSIS — N3281 Overactive bladder: Secondary | ICD-10-CM

## 2014-02-25 DIAGNOSIS — K219 Gastro-esophageal reflux disease without esophagitis: Secondary | ICD-10-CM

## 2014-02-25 DIAGNOSIS — G4700 Insomnia, unspecified: Secondary | ICD-10-CM

## 2014-02-25 DIAGNOSIS — E785 Hyperlipidemia, unspecified: Secondary | ICD-10-CM

## 2014-02-25 DIAGNOSIS — I10 Essential (primary) hypertension: Secondary | ICD-10-CM

## 2014-02-25 DIAGNOSIS — Z9660 Presence of unspecified orthopedic joint implant: Secondary | ICD-10-CM

## 2014-02-25 NOTE — Progress Notes (Signed)
MRN: [id number] Name: Wendy Torres  Sex: female Age: 73 y.o. DOB: 1940-11-30  [place] #: [facility] Facility/Room: 504 Level Of Care: SNF Provider: Inocencio Homes D Emergency Contacts: Extended Emergency Contact Information Primary Emergency Contact: Sunnie Nielsen [place] of [place] Phone: [phone] Mobile Phone: [phone] Relation: Daughter Secondary Emergency Contact: Garlon Hatchet [place] of [place] Phone: [phone] Mobile Phone: [phone] Relation: Daughter  Code Status: FULL  Allergies: Review of patient's allergies indicates no known allergies.  Chief Complaint  Patient presents with  . New Admit To SNF    HPI: Patient is 73 y.o. female who is admitted to SNF s/p l hip arthroplasty for OT/PT.  Past Medical History  Diagnosis Date  . HYPERLIPIDEMIA 04/18/2007  . Morbid obesity 12/09/2006  . ANEMIA 01/14/2009  . Chronic pain syndrome 02/16/2009  . HYPERTENSION 12/09/2006  . ACUTE URIS OF UNSPECIFIED SITE 10/22/2007  . ALLERGIC RHINITIS 12/09/2006  . ASTHMA 12/09/2006  . COPD 12/09/2006  . Glossitis 01/14/2009  . GERD 12/09/2006  . HIATAL HERNIA 03/01/2010  . OVERACTIVE BLADDER 04/14/2008  . DEGENERATIVE JOINT DISEASE 12/09/2006  . LOW BACK PAIN 12/09/2006  . OSTEOPOROSIS 12/09/2006  . SYNCOPE 11/05/2008  . DIZZINESS 03/30/2010  . INSOMNIA-SLEEP DISORDER-UNSPEC 11/05/2008  . HYPERSOMNIA 08/31/2008  . PALPITATIONS, RECURRENT 01/27/2010  . FREQUENCY, URINARY 03/30/2010  . Nonspecific (abnormal) findings on radiological and other examination of body structure 11/05/2008  . TRANSIENT ISCHEMIC ATTACK, HX OF 12/09/2006  . HELICOBACTER PYLORI GASTRITIS, HX OF 12/09/2006  . ABNORMAL CHEST XRAY 11/05/2008  . DYSPNEA 01/27/2010    with heavy exertion    Past Surgical History  Procedure Laterality Date  . Tubal ligation    . Lumbar disc surgery    . Colonoscopy    . Tonsillectomy      as a child  . Total hip arthroplasty Right 11/06/2012    Procedure:  RIGHT TOTAL HIP ARTHROPLASTY WITH ACETABULAR AUTOGRAFT;  Surgeon: Gearlean Alf, MD;  Location: [facility];  Service: Orthopedics;  Laterality: Right;  . Total hip arthroplasty Left 02/18/2014    Procedure: LEFT TOTAL HIP ARTHROPLASTY;  Surgeon: Gearlean Alf, MD;  Location: [facility];  Service: Orthopedics;  Laterality: Left;      Medication List       This list is accurate as of: 02/25/14 11:59 PM.  Always use your most recent med list.               acetaminophen 325 MG tablet  Commonly known as:  TYLENOL  Take 2 tablets (650 mg total) by mouth every 6 (six) hours as needed for mild pain (or Fever >/= 101).     ALPRAZolam 0.25 MG tablet  Commonly known as:  XANAX  Take 1 tablet (0.25 mg total) by mouth 2 (two) times daily as needed for anxiety.     amLODipine 10 MG tablet  Commonly known as:  NORVASC  Take 10 mg by mouth every morning.     bisacodyl 10 MG suppository  Commonly known as:  DULCOLAX  Place 1 suppository (10 mg total) rectally daily as needed for moderate constipation.     cloNIDine 0.3 MG tablet  Commonly known as:  CATAPRES  Take 0.3 mg by mouth 2 (two) times daily.     DSS 100 MG Caps  Take 100 mg by mouth 2 (two) times daily.     furosemide 20 MG tablet  Commonly known as:  LASIX  Take 20 mg by mouth every morning.  gabapentin 300 MG capsule  Commonly known as:  NEURONTIN  Take 300 mg by mouth 3 (three) times daily.     gabapentin 300 MG capsule  Commonly known as:  NEURONTIN  TAKE 1 CAPSULE (300 MG TOTAL) BY MOUTH 3 (THREE) TIMES DAILY.     losartan 50 MG tablet  Commonly known as:  COZAAR  Take 50 mg by mouth every morning.     lovastatin 40 MG tablet  Commonly known as:  MEVACOR  Take 40 mg by mouth at bedtime.     methocarbamol 500 MG tablet  Commonly known as:  ROBAXIN  Take 1 tablet (500 mg total) by mouth every 6 (six) hours as needed for muscle spasms.     omeprazole 20 MG capsule  Commonly known as:  PRILOSEC  Take 40 mg by  mouth every morning.     ondansetron 4 MG tablet  Commonly known as:  ZOFRAN  Take 1 tablet (4 mg total) by mouth every 6 (six) hours as needed for nausea.     oxybutynin 5 MG tablet  Commonly known as:  DITROPAN  Take 5 mg by mouth 3 (three) times daily.     oxyCODONE 5 MG immediate release tablet  Commonly known as:  Oxy IR/ROXICODONE  Take 1-2 tablets (5-10 mg total) by mouth every 3 (three) hours as needed for moderate pain, severe pain or breakthrough pain.     polyethylene glycol packet  Commonly known as:  MIRALAX / GLYCOLAX  Take 17 g by mouth daily as needed for mild constipation.     rivaroxaban 10 MG Tabs tablet  Commonly known as:  XARELTO  - Take 1 tablet (10 mg total) by mouth daily with breakfast. Take Xarelto for two and a half more weeks, then discontinue Xarelto.  - Once the patient has completed the blood thinner regimen, then take a 325 mg Aspirin daily for three more weeks.     SYSTANE OP  Place 1 drop into both eyes. Three to four times daily.     traMADol 50 MG tablet  Commonly known as:  ULTRAM  Take 1-2 tablets (50-100 mg total) by mouth every 6 (six) hours as needed (mild pain).     traZODone 50 MG tablet  Commonly known as:  DESYREL  Take 25-50 mg by mouth at bedtime as needed for sleep.        No orders of the defined types were placed in this encounter.    Immunization History  Administered Date(s) Administered  . Influenza Split 04/07/2011, 02/28/2012  . Influenza Whole 04/15/2004, 04/18/2007, 01/27/2010  . Pneumococcal Conjugate-13 12/03/2013  . Pneumococcal Polysaccharide-23 05/02/2003, 09/29/2008  . Tdap 04/07/2011    History  Substance Use Topics  . Smoking status: Former Smoker -- 0.50 packs/day for 25 years    Types: Cigarettes    Quit date: 10/29/1972  . Smokeless tobacco: Never Used  . Alcohol Use: No    Family history is noncontributory    Review of Systems  DATA OBTAINED: from patient GENERAL:  no fevers, fatigue,  appetite changes SKIN: No itching, rash or wounds EYES: No eye pain, redness, discharge EARS: No earache, tinnitus, change in hearing NOSE: No congestion, drainage or bleeding  MOUTH/THROAT: No mouth or tooth pain, No sore throat RESPIRATORY: No cough, wheezing, SOB CARDIAC: No chest pain, palpitations, lower extremity edema  GI: No abdominal pain, No N/V/D or constipation, No heartburn or reflux  GU: No dysuria, frequency or urgency, or incontinence  MUSCULOSKELETAL:  No unrelieved bone/joint pain NEUROLOGIC: No headache, dizziness or focal weakness PSYCHIATRIC: No overt anxiety or sadness, No behavior issue.   Filed Vitals:   02/27/14 1554  BP: 147/77  Pulse: 75  Temp: 98 F (36.7 C)  Resp: 18    Physical Exam  GENERAL APPEARANCE: Alert, conversant,  No acute distress.  SKIN: No diaphoresis rash HEAD: Normocephalic, atraumatic  EYES: Conjunctiva/lids clear. Pupils round, reactive. EOMs intact.  EARS: External exam WNL, canals clear. Hearing grossly normal.  NOSE: No deformity or discharge.  MOUTH/THROAT: Lips w/o lesions  RESPIRATORY: Breathing is even, unlabored. Lung sounds are clear   CARDIOVASCULAR: Heart RRR no murmurs, rubs or gallops. No peripheral edema.   GASTROINTESTINAL: Abdomen is soft, non-tender, not distended w/ normal bowel sounds. GENITOURINARY: Bladder non tender, not distended  MUSCULOSKELETAL: No abnormal joints or musculature NEUROLOGIC:  Cranial nerves 2-12 grossly intact. Moves all extremities  PSYCHIATRIC: Mood and affect appropriate to situation, no behavioral issues  Patient Active Problem List   Diagnosis Date Noted  . S/P total hip arthroplasty 02/27/2014  . Anxiety state, unspecified 12/03/2013  . Acute posthemorrhagic anemia 12/12/2012  . Cellulitis 11/12/2012  . Constipation 11/12/2012  . Edema 11/12/2012  . Overactive bladder 11/12/2012  . Candida infection of mouth 11/12/2012  . Hypokalemia 11/07/2012  . OA (osteoarthritis) of hip  11/06/2012  . Dysphagia, unspecified(787.20) 10/23/2012  . Chest pain 10/23/2012  . Dizziness 05/15/2012  . Osteoporosis 05/15/2012  . Peripheral edema 05/15/2012  . DJD (degenerative joint disease) of knee 02/15/2012  . Olecranon bursitis of right elbow 11/23/2011  . Cervical radiculopathy 09/30/2011  . Gait disorder 09/04/2011  . Preop exam for internal medicine 04/07/2011  . Preventative health care 08/12/2010  . HIATAL HERNIA 03/01/2010  . PALPITATIONS, RECURRENT 01/27/2010  . Chronic pain syndrome 02/16/2009  . ANEMIA 01/14/2009  . SYNCOPE 11/05/2008  . Insomnia 11/05/2008  . HYPERSOMNIA 08/31/2008  . OVERACTIVE BLADDER 04/14/2008  . Hyperlipidemia 04/18/2007  . Morbid obesity 12/09/2006  . Essential hypertension 12/09/2006  . ALLERGIC RHINITIS 12/09/2006  . ASTHMA 12/09/2006  . COPD 12/09/2006  . GERD 12/09/2006  . DEGENERATIVE JOINT DISEASE 12/09/2006  . LOW BACK PAIN 12/09/2006  . OSTEOPOROSIS 12/09/2006  . TRANSIENT ISCHEMIC ATTACK, HX OF 12/09/2006  . HELICOBACTER PYLORI GASTRITIS, HX OF 12/09/2006    CBC    Component Value Date/Time   WBC 12.1* 02/20/2014 0446   RBC 3.60* 02/20/2014 0446   HGB 8.2* 02/20/2014 0446   HCT 26.3* 02/20/2014 0446   PLT 338 02/20/2014 0446   MCV 73.1* 02/20/2014 0446   LYMPHSABS 3.0 12/04/2013 1137   MONOABS 0.8 12/04/2013 1137   EOSABS 0.2 12/04/2013 1137   BASOSABS 0.0 12/04/2013 1137    CMP     Component Value Date/Time   NA 140 02/20/2014 0446   K 3.7 02/20/2014 0446   CL 102 02/20/2014 0446   CO2 27 02/20/2014 0446   GLUCOSE 102* 02/20/2014 0446   BUN 11 02/20/2014 0446   CREATININE 0.76 02/20/2014 0446   CALCIUM 9.3 02/20/2014 0446   PROT 8.2 02/10/2014 1110   ALBUMIN 3.5 02/10/2014 1110   AST 19 02/10/2014 1110   ALT 9 02/10/2014 1110   ALKPHOS 106 02/10/2014 1110   BILITOT 0.2* 02/10/2014 1110   GFRNONAA 80* 02/20/2014 0446   GFRAA >90 02/20/2014 0446    Assessment and Plan  S/P total hip arthroplasty 2/2  endstage arthritis; pain meds, robaxin and xarelto as prophylaxis for 2 1/2 more weeks then ASA  325 daily for 3 more weeks  Essential hypertension Continue all 4 current meds  GERD Continue omeprazole  Overactive bladder Continue oxybutanin  Hyperlipidemia Continue mevacor  Insomnia Trazodone 50 mg nightly    Hennie Duos, MD

## 2014-02-27 ENCOUNTER — Encounter: Payer: Self-pay | Admitting: Internal Medicine

## 2014-02-27 DIAGNOSIS — Z96649 Presence of unspecified artificial hip joint: Secondary | ICD-10-CM | POA: Insufficient documentation

## 2014-02-27 NOTE — Assessment & Plan Note (Signed)
2/2 endstage arthritis; pain meds, robaxin and xarelto as prophylaxis for 2 1/2 more weeks then ASA 325 daily for 3 more weeks

## 2014-02-27 NOTE — Assessment & Plan Note (Signed)
Continue oxybutanin

## 2014-02-27 NOTE — Assessment & Plan Note (Signed)
Continue all 4 current meds

## 2014-02-27 NOTE — Assessment & Plan Note (Signed)
Trazodone 50 mg nightly

## 2014-02-27 NOTE — Assessment & Plan Note (Signed)
Continue omeprazole 

## 2014-02-27 NOTE — Assessment & Plan Note (Signed)
Continue mevacor °

## 2014-03-02 ENCOUNTER — Encounter: Payer: Self-pay | Admitting: Internal Medicine

## 2014-03-02 ENCOUNTER — Non-Acute Institutional Stay (SKILLED_NURSING_FACILITY): Payer: Medicare Other | Admitting: Internal Medicine

## 2014-03-02 DIAGNOSIS — R059 Cough, unspecified: Secondary | ICD-10-CM | POA: Insufficient documentation

## 2014-03-02 DIAGNOSIS — I10 Essential (primary) hypertension: Secondary | ICD-10-CM

## 2014-03-02 DIAGNOSIS — D539 Nutritional anemia, unspecified: Secondary | ICD-10-CM | POA: Insufficient documentation

## 2014-03-02 DIAGNOSIS — D649 Anemia, unspecified: Secondary | ICD-10-CM

## 2014-03-02 DIAGNOSIS — R05 Cough: Secondary | ICD-10-CM

## 2014-03-02 NOTE — Progress Notes (Signed)
Patient ID: Wendy Torres, female   DOB: June 02, 1940, 73 y.o.   MRN: [id number]  this is an acute visit.  Level care skilled  [facility].   HPI: Patient is 73 y.o. female who is admitted to SNF s/p l hip arthroplasty for OT/PT---apparently she is complaining of a dry cough occasionally.  Vital signs appear to be stable O2 saturation is in the mid 90s on room air.--she denies any shortness of breath chest pain fever or chills.  She says she got a dose of Robitussin and this really helped.  She is complaining of feeling somewhat hoarse  Past Medical History  Diagnosis Date  . HYPERLIPIDEMIA 04/18/2007  . Morbid obesity 12/09/2006  . ANEMIA 01/14/2009  . Chronic pain syndrome 02/16/2009  . HYPERTENSION 12/09/2006  . ACUTE URIS OF UNSPECIFIED SITE 10/22/2007  . ALLERGIC RHINITIS 12/09/2006  . ASTHMA 12/09/2006  . COPD 12/09/2006  . Glossitis 01/14/2009  . GERD 12/09/2006  . HIATAL HERNIA 03/01/2010  . OVERACTIVE BLADDER 04/14/2008  . DEGENERATIVE JOINT DISEASE 12/09/2006  . LOW BACK PAIN 12/09/2006  . OSTEOPOROSIS 12/09/2006  . SYNCOPE 11/05/2008  . DIZZINESS 03/30/2010  . INSOMNIA-SLEEP DISORDER-UNSPEC 11/05/2008  . HYPERSOMNIA 08/31/2008  . PALPITATIONS, RECURRENT 01/27/2010  . FREQUENCY, URINARY 03/30/2010  . Nonspecific (abnormal) findings on radiological and other examination of body structure 11/05/2008  . TRANSIENT ISCHEMIC ATTACK, HX OF 12/09/2006  . HELICOBACTER PYLORI GASTRITIS, HX OF 12/09/2006  . ABNORMAL CHEST XRAY 11/05/2008  . DYSPNEA 01/27/2010    with heavy exertion    Past Surgical History  Procedure Laterality Date  . Tubal ligation    . Lumbar disc surgery    . Colonoscopy    . Tonsillectomy      as a child  . Total hip arthroplasty Right 11/06/2012    Procedure: RIGHT TOTAL HIP ARTHROPLASTY WITH ACETABULAR AUTOGRAFT; Surgeon: Gearlean Alf,  MD; Location: [facility]; Service: Orthopedics; Laterality: Right;  . Total hip arthroplasty Left 02/18/2014    Procedure: LEFT TOTAL HIP ARTHROPLASTY; Surgeon: Gearlean Alf, MD; Location: [facility]; Service: Orthopedics; Laterality: Left;      Medication List              acetaminophen 325 MG tablet  Commonly known as: TYLENOL  Take 2 tablets (650 mg total) by mouth every 6 (six) hours as needed for mild pain (or Fever >/= 101).     ALPRAZolam 0.25 MG tablet  Commonly known as: XANAX  Take 1 tablet (0.25 mg total) by mouth 2 (two) times daily as needed for anxiety.     amLODipine 10 MG tablet  Commonly known as: NORVASC  Take 10 mg by mouth every morning.     bisacodyl 10 MG suppository  Commonly known as: DULCOLAX  Place 1 suppository (10 mg total) rectally daily as needed for moderate constipation.     cloNIDine 0.3 MG tablet  Commonly known as: CATAPRES  Take 0.3 mg by mouth 2 (two) times daily.     DSS 100 MG Caps  Take 100 mg by mouth 2 (two) times daily.     furosemide 20 MG tablet  Commonly known as: LASIX  Take 20 mg by mouth every morning.     gabapentin 300 MG capsule  Commonly known as: NEURONTIN  Take 300 mg by mouth 3 (three) times daily.     gabapentin 300 MG capsule  Commonly known as: NEURONTIN  TAKE 1 CAPSULE (300 MG TOTAL) BY MOUTH 3 (THREE) TIMES DAILY.  losartan 50 MG tablet  Commonly known as: COZAAR  Take 50 mg by mouth every morning.     lovastatin 40 MG tablet  Commonly known as: MEVACOR  Take 40 mg by mouth at bedtime.     methocarbamol 500 MG tablet  Commonly known as: ROBAXIN  Take 1 tablet (500 mg total) by mouth every 6 (six) hours as needed for muscle spasms.     omeprazole 20 MG capsule  Commonly known as: PRILOSEC  Take 40 mg by mouth every morning.     ondansetron 4 MG tablet  Commonly known as: ZOFRAN  Take 1 tablet  (4 mg total) by mouth every 6 (six) hours as needed for nausea.     oxybutynin 5 MG tablet  Commonly known as: DITROPAN  Take 5 mg by mouth 3 (three) times daily.     oxyCODONE 5 MG immediate release tablet  Commonly known as: Oxy IR/ROXICODONE  Take 1-2 tablets (5-10 mg total) by mouth every 3 (three) hours as needed for moderate pain, severe pain or breakthrough pain.     polyethylene glycol packet  Commonly known as: MIRALAX / GLYCOLAX  Take 17 g by mouth daily as needed for mild constipation.     rivaroxaban 10 MG Tabs tablet  Commonly known as: XARELTO  - Take 1 tablet (10 mg total) by mouth daily with breakfast. Take Xarelto for two and a half more weeks, then discontinue Xarelto.  - Once the patient has completed the blood thinner regimen, then take a 325 mg Aspirin daily for three more weeks.     SYSTANE OP  Place 1 drop into both eyes. Three to four times daily.     traMADol 50 MG tablet  Commonly known as: ULTRAM  Take 1-2 tablets (50-100 mg total) by mouth every 6 (six) hours as needed (mild pain).     traZODone 50 MG tablet  Commonly known as: DESYREL  Take 25-50 mg by mouth at bedtime as needed for sleep.        No orders of the defined types were placed in this encounter.   Immunization History  Administered Date(s) Administered  . Influenza Split 04/07/2011, 02/28/2012  . Influenza Whole 04/15/2004, 04/18/2007, 01/27/2010  . Pneumococcal Conjugate-13 12/03/2013  . Pneumococcal Polysaccharide-23 05/02/2003, 09/29/2008  . Tdap 04/07/2011    History  Substance Use Topics  . Smoking status: Former Smoker -- 0.50 packs/day for 25 years    Types: Cigarettes    Quit date: 10/29/1972  . Smokeless tobacco: Never Used  . Alcohol Use: No    Family history is noncontributory   Review of Systems  DATA OBTAINED: from patient GENERAL: no fevers, fatigue, appetite  changes SKIN: No itching, rash or wounds EYES: No eye pain, redness, discharge EARS: No earache, tinnitus, change in hearing NOSE: No congestion, drainage or bleeding  MOUTH/THROAT-- No mouth or tooth pain, No sore throatis hoarse RESPIRATORY: has a  cough,no wheezingor, SOB CARDIAC: No chest pain, palpitations, lower extremity edema  GI: No abdominal pain, No N/V/D or constipation, No heartburn or reflux  GU: No dysuria, frequency or urgency, or incontinence  MUSCULOSKELETAL: No unrelieved bone/joint pain NEUROLOGIC: No headache, dizziness or focal weakness PSYCHIATRIC: No overt anxiety or sadness, No behavior issue.                       Physical Exam Temperature 98.1 pulse 84 respirations 20 blood pressure 140/90 O2 saturation 94% on room air GENERAL APPEARANCE: Alert,  conversant, No acute distress.  SKIN: No diaphoresis rash HEAD: Normocephalic, atraumatic  EYES: Conjunctiva/lids clear. Pupils round, reactive. EOMs intact.  EARS: External exam WNL, canals clear. Hearing grossly normal.  NOSE: No deformity or discharge.  MOUTH/THROAT: Lips w/o lesions oropharynx is clear mucous membranes are moist RESPIRATORY: Breathing is even, unlabored. Lung sounds are clear  CARDIOVASCULAR: Heart RRR no murmurs, rubs or gallops. No peripheral edema.pedal pulses intact  GASTROINTESTINAL: Abdomen is soft, non-tender, not distended w/ normal bowel sounds.  MUSCULOSKELETAL: No abnormal joints or musculature--Limited exam patient sitting in chair NEUROLOGIC: Cranial nerves 2-12 grossly intact. Moves all extremities  PSYCHIATRIC: Mood and affect appropriate to situation, no behavioral issues  Patient Active Problem List   Diagnosis Date Noted  . S/P total hip arthroplasty 02/27/2014  . Anxiety state, unspecified 12/03/2013  . Acute posthemorrhagic anemia 12/12/2012  . Cellulitis 11/12/2012  . Constipation 11/12/2012  . Edema 11/12/2012  .  Overactive bladder 11/12/2012  . Candida infection of mouth 11/12/2012  . Hypokalemia 11/07/2012  . OA (osteoarthritis) of hip 11/06/2012  . Dysphagia, unspecified(787.20) 10/23/2012  . Chest pain 10/23/2012  . Dizziness 05/15/2012  . Osteoporosis 05/15/2012  . Peripheral edema 05/15/2012  . DJD (degenerative joint disease) of knee 02/15/2012  . Olecranon bursitis of right elbow 11/23/2011  . Cervical radiculopathy 09/30/2011  . Gait disorder 09/04/2011  . Preop exam for internal medicine 04/07/2011  . Preventative health care 08/12/2010  . HIATAL HERNIA 03/01/2010  . PALPITATIONS, RECURRENT 01/27/2010  . Chronic pain syndrome 02/16/2009  . ANEMIA 01/14/2009  . SYNCOPE 11/05/2008  . Insomnia 11/05/2008  . HYPERSOMNIA 08/31/2008  . OVERACTIVE BLADDER 04/14/2008  . Hyperlipidemia 04/18/2007  . Morbid obesity 12/09/2006  . Essential hypertension 12/09/2006  . ALLERGIC RHINITIS 12/09/2006  . ASTHMA 12/09/2006  . COPD 12/09/2006  . GERD 12/09/2006  . DEGENERATIVE JOINT DISEASE 12/09/2006  . LOW BACK PAIN 12/09/2006  . OSTEOPOROSIS 12/09/2006  . TRANSIENT ISCHEMIC ATTACK, HX OF 12/09/2006  . HELICOBACTER PYLORI GASTRITIS, HX OF 12/09/2006    CBC  Labs (Brief)       Component Value Date/Time   WBC 12.1* 02/20/2014 0446   RBC 3.60* 02/20/2014 0446   HGB 8.2* 02/20/2014 0446   HCT 26.3* 02/20/2014 0446   PLT 338 02/20/2014 0446   MCV 73.1* 02/20/2014 0446   LYMPHSABS 3.0 12/04/2013 1137   MONOABS 0.8 12/04/2013 1137   EOSABS 0.2 12/04/2013 1137   BASOSABS 0.0 12/04/2013 1137      CMP  Labs (Brief)       Component Value Date/Time   NA 140 02/20/2014 0446   K 3.7 02/20/2014 0446   CL 102 02/20/2014 0446   CO2 27 02/20/2014 0446   GLUCOSE 102* 02/20/2014 0446   BUN 11 02/20/2014 0446    CREATININE 0.76 02/20/2014 0446   CALCIUM 9.3 02/20/2014 0446   PROT 8.2 02/10/2014 1110   ALBUMIN 3.5 02/10/2014 1110   AST 19 02/10/2014 1110   ALT 9 02/10/2014 1110   ALKPHOS 106 02/10/2014 1110   BILITOT 0.2* 02/10/2014 1110   GFRNONAA 80* 02/20/2014 0446   GFRAA >90 02/20/2014 0446      Assessment and Plan  S/P total hip arthroplasty 2/2 endstage arthritis; pain meds, robaxin and xarelto as prophylaxis for 2  more weeks then ASA 325 daily for 3 more weeks  Essential hypertension Continue all 4 current meds--appears fairly stable there is some systolic variation from [id number] I do not see consistent elevations  cough-physical exam was quite  benign she says the guaifenesin has helpedcough appears almost resolved-we will continue this when necessary and monitor with vital signs pulse ox every shift for 72 hours.  In regards to feeling hoarse Will order Chloraseptic spray when necessary as well and monitor   anemia-suspect this is postop-but would like to update hemoglobin to ensure stability--clinically she appears to be stable  GERD Continue omeprazole  Overactive bladder Continue oxybutanin  Hyperlipidemia Continue mevacor  Insomnia Trazodone 50 mg nightly  [id number]

## 2014-03-09 ENCOUNTER — Encounter: Payer: Self-pay | Admitting: Internal Medicine

## 2014-03-09 ENCOUNTER — Non-Acute Institutional Stay (SKILLED_NURSING_FACILITY): Payer: Medicare Other | Admitting: Internal Medicine

## 2014-03-09 DIAGNOSIS — R05 Cough: Secondary | ICD-10-CM

## 2014-03-09 DIAGNOSIS — I10 Essential (primary) hypertension: Secondary | ICD-10-CM

## 2014-03-09 DIAGNOSIS — K219 Gastro-esophageal reflux disease without esophagitis: Secondary | ICD-10-CM

## 2014-03-09 DIAGNOSIS — R059 Cough, unspecified: Secondary | ICD-10-CM

## 2014-03-09 DIAGNOSIS — Z96649 Presence of unspecified artificial hip joint: Secondary | ICD-10-CM

## 2014-03-09 DIAGNOSIS — Z9660 Presence of unspecified orthopedic joint implant: Secondary | ICD-10-CM

## 2014-03-09 NOTE — Progress Notes (Signed)
Patient ID: Wendy Torres, female   DOB: 1940-11-08, 73 y.o.   MRN: [id number]   this is a discharge note.  Level care skilled  [facility].   HPI: Patient is 73 y.o. female who is admitted to SNF s/p l hip arthroplasty for OT/PT---her stay here has been fairly unremarkable she has progressed well with physical therapy and she has no complaints today she will be going home will need continued PT and OT as well as a rolling walker for ambulation assistance.  I did see her recently for cough which appears to be better when necessary cough medicine has helped she says.  She also is complaining occasionally of some gas pain and would like something for that.     Past Medical History  Diagnosis Date  . HYPERLIPIDEMIA 04/18/2007  . Morbid obesity 12/09/2006  . ANEMIA 01/14/2009  . Chronic pain syndrome 02/16/2009  . HYPERTENSION 12/09/2006  . ACUTE URIS OF UNSPECIFIED SITE 10/22/2007  . ALLERGIC RHINITIS 12/09/2006  . ASTHMA 12/09/2006  . COPD 12/09/2006  . Glossitis 01/14/2009  . GERD 12/09/2006  . HIATAL HERNIA 03/01/2010  . OVERACTIVE BLADDER 04/14/2008  . DEGENERATIVE JOINT DISEASE 12/09/2006  . LOW BACK PAIN 12/09/2006  . OSTEOPOROSIS 12/09/2006  . SYNCOPE 11/05/2008  . DIZZINESS 03/30/2010  . INSOMNIA-SLEEP DISORDER-UNSPEC 11/05/2008  . HYPERSOMNIA 08/31/2008  . PALPITATIONS, RECURRENT 01/27/2010  . FREQUENCY, URINARY 03/30/2010  . Nonspecific (abnormal) findings on radiological and other examination of body structure 11/05/2008  . TRANSIENT ISCHEMIC ATTACK, HX OF 12/09/2006  . HELICOBACTER PYLORI GASTRITIS, HX OF 12/09/2006  . ABNORMAL CHEST XRAY 11/05/2008  . DYSPNEA 01/27/2010    with heavy exertion    Past Surgical History  Procedure Laterality Date  . Tubal ligation    . Lumbar disc  surgery    . Colonoscopy    . Tonsillectomy      as a child  . Total hip arthroplasty Right 11/06/2012    Procedure: RIGHT TOTAL HIP ARTHROPLASTY WITH ACETABULAR AUTOGRAFT; Surgeon: Gearlean Alf, MD; Location: [facility]; Service: Orthopedics; Laterality: Right;  . Total hip arthroplasty Left 02/18/2014    Procedure: LEFT TOTAL HIP ARTHROPLASTY; Surgeon: Gearlean Alf, MD; Location: [facility]; Service: Orthopedics; Laterality: Left;      Medication List              acetaminophen 325 MG tablet  Commonly known as: TYLENOL  Take 2 tablets (650 mg total) by mouth every 6 (six) hours as needed for mild pain (or Fever >/= 101).     ALPRAZolam 0.25 MG tablet  Commonly known as: XANAX  Take 1 tablet (0.25 mg total) by mouth 2 (two) times daily as needed for anxiety.     amLODipine 10 MG tablet  Commonly known as: NORVASC  Take 10 mg by mouth every morning.     bisacodyl 10 MG suppository  Commonly known as: DULCOLAX  Place 1 suppository (10 mg total) rectally daily as needed for moderate constipation.     cloNIDine 0.3 MG tablet  Commonly known as: CATAPRES  Take 0.3 mg by mouth 2 (two) times daily.     DSS 100 MG Caps  Take 100 mg by mouth 2 (two) times daily.     furosemide 20 MG tablet  Commonly known as: LASIX  Take 20 mg by mouth every morning.     gabapentin 300 MG capsule  Commonly known as: NEURONTIN  Take 300 mg by mouth 3 (three) times daily.  gabapentin 300 MG capsule  Commonly known as: NEURONTIN  TAKE 1 CAPSULE (300 MG TOTAL) BY MOUTH 3 (THREE) TIMES DAILY.     losartan 50 MG tablet  Commonly known as: COZAAR  Take 50 mg by mouth every morning.     lovastatin 40 MG tablet  Commonly known as: MEVACOR  Take 40 mg by mouth at bedtime.     methocarbamol 500  MG tablet  Commonly known as: ROBAXIN  Take 1 tablet (500 mg total) by mouth every 6 (six) hours as needed for muscle spasms.     omeprazole 20 MG capsule  Commonly known as: PRILOSEC  Take 40 mg by mouth every morning.     ondansetron 4 MG tablet  Commonly known as: ZOFRAN  Take 1 tablet (4 mg total) by mouth every 6 (six) hours as needed for nausea.     oxybutynin 5 MG tablet  Commonly known as: DITROPAN  Take 5 mg by mouth 3 (three) times daily.     oxyCODONE 5 MG immediate release tablet  Commonly known as: Oxy IR/ROXICODONE  Take 1-2 tablets (5-10 mg total) by mouth every 3 (three) hours as needed for moderate pain, severe pain or breakthrough pain.     polyethylene glycol packet  Commonly known as: MIRALAX / GLYCOLAX  Take 17 g by mouth daily as needed for mild constipation.     rivaroxaban 10 MG Tabs tablet  Commonly known as: XARELTO  - Take 1 tablet (10 mg total) by mouth daily with breakfast. Take Xarelto for two and a half more weeks, then discontinue Xarelto.  - Once the patient has completed the blood thinner regimen, then take a 325 mg Aspirin daily for three more weeks.     SYSTANE OP  Place 1 drop into both eyes. Three to four times daily.     traMADol 50 MG tablet  Commonly known as: ULTRAM  Take 1-2 tablets (50-100 mg total) by mouth every 6 (six) hours as needed (mild pain).     traZODone 50 MG tablet  Commonly known as: DESYREL  Take 25-50 mg by mouth at bedtime as needed for sleep.        No orders of the defined types were placed in this encounter.   Immunization History  Administered Date(s) Administered  . Influenza Split 04/07/2011, 02/28/2012  . Influenza Whole 04/15/2004, 04/18/2007, 01/27/2010  . Pneumococcal Conjugate-13 12/03/2013  . Pneumococcal Polysaccharide-23 05/02/2003, 09/29/2008  .  Tdap 04/07/2011    History  Substance Use Topics  . Smoking status: Former Smoker -- 0.50 packs/day for 25 years    Types: Cigarettes    Quit date: 10/29/1972  . Smokeless tobacco: Never Used  . Alcohol Use: No    Family history is noncontributory   Review of Systems  DATA OBTAINED: from patient GENERAL: no fevers, fatigue, appetite changes SKIN: No itching, rash or wounds EYES: No eye pain, redness, discharge EARS: No earache, tinnitus, change in hearing NOSE: No congestion, drainage or bleeding  MOUTH/THROAT-- No mouth or tooth pain, No sore throatis hoarse RESPIRATORY:cough has improved--,no wheezingor, SOB CARDIAC: No chest pain, palpitations, lower extremity edema  GI: No abdominal pain, No N/V/D or constipation, No heartburn or reflux --complains of occasional gas pains GU: No dysuria, frequency or urgency, or incontinence  MUSCULOSKELETAL: No unrelieved bone/joint pain NEUROLOGIC: No headache, dizziness or focal weakness PSYCHIATRIC: No overt anxiety or sadness, No behavior issue.  Physical Exam Temp to 97.8 pulse 68 respirations 20 blood pressure 143/72 GENERAL APPEARANCE: Alert, conversant, No acute distress.  SKIN: No diaphoresis rash HEAD: Normocephalic, atraumatic  EYES: Conjunctiva/lids clear. Pupils round, reactive. EOMs intact.  EARS: External exam WNL, canals clear. Hearing grossly normal.  NOSE: No deformity or discharge.  MOUTH/THROAT: Lips w/o lesions oropharynx is clear mucous membranes are moist RESPIRATORY: Breathing is even, unlabored. Lung sounds are clear  CARDIOVASCULAR: Heart RRR no murmurs, rubs or gallops. No peripheral edema.pedal pulses intact  GASTROINTESTINAL: Abdomen is soft, non-tender, not distended w/ normal bowel sounds.  MUSCULOSKELETAL: No abnormal joints or musculature-is able to get out of her chair unassisted and use her  rolling walker that surgical site left hip appears to be well-healed with dried crusting there are a couple Steri-Strips still in place there is no sign of infection no drainage bleeding or surrounding erythema NEUROLOGIC: Cranial nerves 2-12 grossly intact. Moves all extremities  PSYCHIATRIC: Mood and affect appropriate to situation, no behavioral issues  Patient Active Problem List   Diagnosis Date Noted  . S/P total hip arthroplasty 02/27/2014  . Anxiety state, unspecified 12/03/2013  . Acute posthemorrhagic anemia 12/12/2012  . Cellulitis 11/12/2012  . Constipation 11/12/2012  . Edema 11/12/2012  . Overactive bladder 11/12/2012  . Candida infection of mouth 11/12/2012  . Hypokalemia 11/07/2012  . OA (osteoarthritis) of hip 11/06/2012  . Dysphagia, unspecified(787.20) 10/23/2012  . Chest pain 10/23/2012  . Dizziness 05/15/2012  . Osteoporosis 05/15/2012  . Peripheral edema 05/15/2012  . DJD (degenerative joint disease) of knee 02/15/2012  . Olecranon bursitis of right elbow 11/23/2011  . Cervical radiculopathy 09/30/2011  . Gait disorder 09/04/2011  . Preop exam for internal medicine 04/07/2011  . Preventative health care 08/12/2010  . HIATAL HERNIA 03/01/2010  . PALPITATIONS, RECURRENT 01/27/2010  . Chronic pain syndrome 02/16/2009  . ANEMIA 01/14/2009  . SYNCOPE 11/05/2008  . Insomnia 11/05/2008  . HYPERSOMNIA 08/31/2008  . OVERACTIVE BLADDER 04/14/2008  . Hyperlipidemia 04/18/2007  . Morbid obesity 12/09/2006  . Essential hypertension 12/09/2006  . ALLERGIC RHINITIS 12/09/2006  . ASTHMA 12/09/2006  . COPD 12/09/2006  . GERD 12/09/2006  . DEGENERATIVE JOINT DISEASE 12/09/2006  . LOW BACK PAIN 12/09/2006  . OSTEOPOROSIS  12/09/2006  . TRANSIENT ISCHEMIC ATTACK, HX OF 12/09/2006  . HELICOBACTER PYLORI GASTRITIS, HX OF 12/09/2006   Labs.  02/20/2014.  WBC 12.1 hemoglobin 8.2 platelets 338.  Sodium 140 potassium 3.7 BUN 11 creatinine 0.76.  Over 13th 2015.  Liver function tests within normal limits except bilirubin of 6.2    Labs (Brief)      Component Value Date/Time   WBC 12.1* 02/20/2014 0446   RBC 3.60* 02/20/2014 0446   HGB 8.2* 02/20/2014 0446   HCT 26.3* 02/20/2014 0446   PLT 338 02/20/2014 0446   MCV 73.1* 02/20/2014 0446   LYMPHSABS 3.0 12/04/2013 1137   MONOABS 0.8 12/04/2013 1137   EOSABS 0.2 12/04/2013 1137   BASOSABS 0.0 12/04/2013 1137      CMP  Labs (Brief)      Component Value Date/Time   NA 140 02/20/2014 0446   K 3.7 02/20/2014 0446   CL 102 02/20/2014 0446   CO2 27 02/20/2014 0446   GLUCOSE 102* 02/20/2014 0446   BUN 11 02/20/2014 0446   CREATININE 0.76 02/20/2014 0446   CALCIUM 9.3 02/20/2014 0446   PROT 8.2 02/10/2014 1110   ALBUMIN 3.5 02/10/2014 1110   AST 19 02/10/2014 1110   ALT 9 02/10/2014 1110  ALKPHOS 106 02/10/2014 1110   BILITOT 0.2* 02/10/2014 1110   GFRNONAA 80* 02/20/2014 0446   GFRAA >90 02/20/2014 0446      Assessment and Plan  S/P total hip arthroplasty 2/2 endstage arthritis; pain meds, robaxin and  Had been on xarelto as prophylaxis-- now is on aspirin for prophylaxis She appears to have progressed well will need continued PT and OT at home as well as home health support and a rolling walker to help with ambulation  Essential hypertension Continue all 4 current meds--appears fairly stable there is some systolic variation from 802M-336P---QAE frequent systolics in the 497N-PYYFR she is about to be  discharged will defer to primary care provider  cough-this appears to have improved  .     anemia-suspect this is postop-but would like to update hemoglobin to ensure stability--clinically she appears to be stable  GERD Continue omeprazole--she is complaining of some gas pain and will prescribe simethicone when necessary  Overactive bladder Continue oxybutanin  Hyperlipidemia Continue mevacor--since her stay here was relatively short have not been aggressive pursuing lipid panel will defer to primary care provider  Insomnia Trazodone 50 mg nightly--apparently this is helping.  Of note update CBC and metabolic panel was ordered on 03/02/2014 if this was not obtained we'll try to obtain before discharge  [id number]-of note greater than 35 minutes spent on this discharge summary-scripts have been written

## 2014-03-12 DIAGNOSIS — M545 Low back pain: Secondary | ICD-10-CM

## 2014-03-12 DIAGNOSIS — I10 Essential (primary) hypertension: Secondary | ICD-10-CM

## 2014-03-12 DIAGNOSIS — G894 Chronic pain syndrome: Secondary | ICD-10-CM

## 2014-03-12 DIAGNOSIS — Z471 Aftercare following joint replacement surgery: Secondary | ICD-10-CM

## 2014-03-12 DIAGNOSIS — J449 Chronic obstructive pulmonary disease, unspecified: Secondary | ICD-10-CM

## 2014-03-12 DIAGNOSIS — D649 Anemia, unspecified: Secondary | ICD-10-CM

## 2014-03-18 ENCOUNTER — Telehealth: Payer: Self-pay | Admitting: *Deleted

## 2014-03-18 NOTE — Telephone Encounter (Signed)
Left msg on triage stating wanting to report pt BP has been running high for the past week in a half. Been between 154/100, 150/80, 150/90. Called pt and made f/u appt for next Wednesday 03/25/14 per pt request.../lmb

## 2014-03-19 ENCOUNTER — Other Ambulatory Visit: Payer: Self-pay | Admitting: Internal Medicine

## 2014-03-19 NOTE — Telephone Encounter (Signed)
Done hardcopy to robin  

## 2014-03-19 NOTE — Telephone Encounter (Signed)
Faxed hardcopy for Alprazolam to [facility]

## 2014-03-25 ENCOUNTER — Ambulatory Visit (INDEPENDENT_AMBULATORY_CARE_PROVIDER_SITE_OTHER): Payer: Medicare Other | Admitting: Internal Medicine

## 2014-03-25 ENCOUNTER — Encounter: Payer: Self-pay | Admitting: Internal Medicine

## 2014-03-25 ENCOUNTER — Telehealth: Payer: Self-pay | Admitting: Internal Medicine

## 2014-03-25 VITALS — BP 156/72 | HR 99 | Temp 98.1°F | Ht 64.0 in | Wt 200.1 lb

## 2014-03-25 DIAGNOSIS — D500 Iron deficiency anemia secondary to blood loss (chronic): Secondary | ICD-10-CM

## 2014-03-25 DIAGNOSIS — K146 Glossodynia: Secondary | ICD-10-CM

## 2014-03-25 DIAGNOSIS — I10 Essential (primary) hypertension: Secondary | ICD-10-CM

## 2014-03-25 DIAGNOSIS — J438 Other emphysema: Secondary | ICD-10-CM

## 2014-03-25 MED ORDER — MAGIC MOUTHWASH: 5.0000 mL | Freq: Four times a day (QID) | ORAL | Status: DC | PRN

## 2014-03-25 MED ORDER — IRBESARTAN 300 MG PO TABS: 300.0000 mg | ORAL_TABLET | Freq: Every day | ORAL | Status: DC

## 2014-03-25 NOTE — Progress Notes (Signed)
Pre visit review using our clinic review tool, if applicable. No additional management support is needed unless otherwise documented below in the visit note. 

## 2014-03-25 NOTE — Assessment & Plan Note (Signed)
For f/u lab next draw with iron level, o/w stable overall by history and exam, recent data reviewed with pt, and pt to continue medical treatment as before,  to f/u any worsening symptoms or concerns Lab Results  Component Value Date   WBC 12.1* 02/20/2014   HGB 8.2* 02/20/2014   HCT 26.3* 02/20/2014   MCV 73.1* 02/20/2014   PLT 338 02/20/2014

## 2014-03-25 NOTE — Patient Instructions (Addendum)
OK to take 2 of the losartan per day (for total 100 mg) until gone  Then change (start) generic Avapro 300 mg per day  Please take all new medication as prescribed  - magic mouthwash  Please continue all other medications as before, and refills have been done if requested.  Please have the pharmacy call with any other refills you may need.  Please keep your appointments with your specialists as you may have planned  Please return to lab at [facility] in 3 weeks for the anemia and kidney function blood work  You will be contacted by phone if any changes need to be made immediately.  Otherwise, you will receive a letter about your results with an explanation, but please check with MyChart first.  Please remember to sign up for MyChart if you have not done so, as this will be important to you in the future with finding out test results, communicating by private email, and scheduling acute appointments online when needed.  Please return in 6 months, or sooner if needed

## 2014-03-25 NOTE — Assessment & Plan Note (Signed)
Mild, for trial magic mouthwash asd prn,  to f/u any worsening symptoms or concerns

## 2014-03-25 NOTE — Telephone Encounter (Signed)
No need for benazapril as she is on avapro now, as they are somewhat similar medications and not normally taken together

## 2014-03-25 NOTE — Assessment & Plan Note (Addendum)
Mild uncontrolled, Ok to d/c losartan (to use up at 100 mg qd if she prefers), then start avapro 300 qd, with f/u bmet at 3 wks, o/w stable overall by history and exam, recent data reviewed with pt, and pt to continue medical treatment as before,  to f/u any worsening symptoms or concerns BP Readings from Last 3 Encounters:  03/25/14 156/72  03/09/14 143/72  03/02/14 140/90

## 2014-03-25 NOTE — Telephone Encounter (Signed)
Pt daughter called back and said that the Benazepril was not on her mother med list and wanted to know if she was suppose to be taking it.  She said she has been taking it for years. She was requesting a call back about this med.

## 2014-03-25 NOTE — Telephone Encounter (Signed)
Patient informed of instructions on medication. 

## 2014-03-25 NOTE — Progress Notes (Signed)
Subjective:    Patient ID: Wendy Torres, female    DOB: 22-Mar-1941, 73 y.o.   MRN: [id number]  HPI Here with daughter, s/p recent left hip THA with postop anemia last hgb 8.2, o/w uncomplicated, and did well with rehab as well.  No overt bleeding or bruising since hosp d/c, Pt denies chest pain, increased sob or doe, wheezing, orthopnea, PND, increased LE swelling, palpitations, dizziness or syncope.  Pt denies new neurological symptoms such as new headache, or facial or extremity weakness or numbness   Pt denies polydipsia, polyuria, Does have recurrent of tongue burning symptom with ? Redness of tongue but no ulcers or swelling or fissures.  No trauma, no fever.  Denies urinary symptoms such as dysuria, frequency, urgency, flank pain, hematuria or n/v, fever, chills.  BP has been persistently elevated post d/c at rehab and now home per daughter, despite no added salt at rehab diet.  Overall good compliance with treatment, and good medicine tolerability. Past Medical History  Diagnosis Date  . HYPERLIPIDEMIA 04/18/2007  . Morbid obesity 12/09/2006  . ANEMIA 01/14/2009  . Chronic pain syndrome 02/16/2009  . HYPERTENSION 12/09/2006  . ACUTE URIS OF UNSPECIFIED SITE 10/22/2007  . ALLERGIC RHINITIS 12/09/2006  . ASTHMA 12/09/2006  . COPD 12/09/2006  . Glossitis 01/14/2009  . GERD 12/09/2006  . HIATAL HERNIA 03/01/2010  . OVERACTIVE BLADDER 04/14/2008  . DEGENERATIVE JOINT DISEASE 12/09/2006  . LOW BACK PAIN 12/09/2006  . OSTEOPOROSIS 12/09/2006  . SYNCOPE 11/05/2008  . DIZZINESS 03/30/2010  . INSOMNIA-SLEEP DISORDER-UNSPEC 11/05/2008  . HYPERSOMNIA 08/31/2008  . PALPITATIONS, RECURRENT 01/27/2010  . FREQUENCY, URINARY 03/30/2010  . Nonspecific (abnormal) findings on radiological and other examination of body structure 11/05/2008  . TRANSIENT ISCHEMIC ATTACK, HX OF 12/09/2006  . HELICOBACTER PYLORI GASTRITIS, HX OF 12/09/2006  . ABNORMAL CHEST XRAY 11/05/2008  . DYSPNEA 01/27/2010    with heavy exertion    Past Surgical History  Procedure Laterality Date  . Tubal ligation    . Lumbar disc surgery    . Colonoscopy    . Tonsillectomy      as a child  . Total hip arthroplasty Right 11/06/2012    Procedure: RIGHT TOTAL HIP ARTHROPLASTY WITH ACETABULAR AUTOGRAFT;  Surgeon: Gearlean Alf, MD;  Location: [facility];  Service: Orthopedics;  Laterality: Right;  . Total hip arthroplasty Left 02/18/2014    Procedure: LEFT TOTAL HIP ARTHROPLASTY;  Surgeon: Gearlean Alf, MD;  Location: [facility];  Service: Orthopedics;  Laterality: Left;    reports that she quit smoking about 41 years ago. Her smoking use included Cigarettes. She has a 12.5 pack-year smoking history. She has never used smokeless tobacco. She reports that she does not drink alcohol or use illicit drugs. family history includes Cancer in her father; Heart disease in her mother; Hypertension in her brother. No Known Allergies Current Outpatient Prescriptions on File Prior to Visit  Medication Sig Dispense Refill  . acetaminophen (TYLENOL) 325 MG tablet Take 2 tablets (650 mg total) by mouth every 6 (six) hours as needed for mild pain (or Fever >/= 101). 40 tablet 0  . ALPRAZolam (XANAX) 0.25 MG tablet TAKE 1 TABLET BY MOUTH TWICE A DAY AS NEEDED 60 tablet 2  . amLODipine (NORVASC) 10 MG tablet Take 10 mg by mouth every morning.    . bisacodyl (DULCOLAX) 10 MG suppository Place 1 suppository (10 mg total) rectally daily as needed for moderate constipation. 12 suppository 0  . cloNIDine (CATAPRES) 0.3 MG  tablet Take 0.3 mg by mouth 2 (two) times daily.    Marland Kitchen docusate sodium 100 MG CAPS Take 100 mg by mouth 2 (two) times daily. 10 capsule 0  . furosemide (LASIX) 20 MG tablet Take 20 mg by mouth every morning.    . gabapentin (NEURONTIN) 300 MG capsule Take 300 mg by mouth 3 (three) times daily.    Marland Kitchen lovastatin (MEVACOR) 40 MG tablet Take 40 mg by mouth at bedtime.    . methocarbamol (ROBAXIN) 500 MG tablet Take 1 tablet (500 mg total) by mouth  every 6 (six) hours as needed for muscle spasms. 80 tablet 0  . omeprazole (PRILOSEC) 20 MG capsule Take 40 mg by mouth every morning.     . ondansetron (ZOFRAN) 4 MG tablet Take 1 tablet (4 mg total) by mouth every 6 (six) hours as needed for nausea. 20 tablet 0  . oxybutynin (DITROPAN) 5 MG tablet Take 5 mg by mouth 3 (three) times daily.    Marland Kitchen oxyCODONE (OXY IR/ROXICODONE) 5 MG immediate release tablet Take 1-2 tablets (5-10 mg total) by mouth every 3 (three) hours as needed for moderate pain, severe pain or breakthrough pain. 80 tablet 0  . Polyethyl Glycol-Propyl Glycol (SYSTANE OP) Place 1 drop into both eyes. Three to four times daily.    . polyethylene glycol (MIRALAX / GLYCOLAX) packet Take 17 g by mouth daily as needed for mild constipation. 14 each 0  . rivaroxaban (XARELTO) 10 MG TABS tablet Take 1 tablet (10 mg total) by mouth daily with breakfast. Take Xarelto for two and a half more weeks, then discontinue Xarelto. Once the patient has completed the blood thinner regimen, then take a 325 mg Aspirin daily for three more weeks. 19 tablet 0  . traMADol (ULTRAM) 50 MG tablet Take 1-2 tablets (50-100 mg total) by mouth every 6 (six) hours as needed (mild pain). 60 tablet 1  . traZODone (DESYREL) 50 MG tablet Take 25-50 mg by mouth at bedtime as needed for sleep.     . [DISCONTINUED] lansoprazole (PREVACID) 30 MG capsule Take 1 capsule (30 mg total) by mouth daily. 90 capsule 3  . [DISCONTINUED] pantoprazole (PROTONIX) 40 MG tablet 1 tab by mouth twice per day 60 tablet 11   Current Facility-Administered Medications on File Prior to Visit  Medication Dose Route Frequency Provider Last Rate Last Dose  . tranexamic acid (CYKLOKAPRON) 2,000 mg in sodium chloride 0.9 % 50 mL Topical Application  0,768 mg Topical Once Amber Renelda Loma, PA-C       Review of Systems  Constitutional: Negative for unusual diaphoresis or other sweats  HENT: Negative for ringing in ear Eyes: Negative for  double vision or worsening visual disturbance.  Respiratory: Negative for choking and stridor.   Gastrointestinal: Negative for vomiting or other signifcant bowel change Genitourinary: Negative for hematuria or decreased urine volume.  Musculoskeletal: Negative for other MSK pain or swelling Skin: Negative for color change and worsening wound.  Neurological: Negative for tremors and numbness other than noted  Psychiatric/Behavioral: Negative for decreased concentration or agitation other than above       Objective:   Physical Exam BP 156/72 mmHg  Pulse 99  Temp(Src) 98.1 F (36.7 C) (Oral)  Ht 5\' 4"  (1.626 m)  Wt 200 lb 2 oz (90.776 kg)  BMI 34.33 kg/m2  SpO2 93% VS noted,  Constitutional: Pt appears well-developed, well-nourished.  HENT: Head: NCAT.  Right Ear: External ear normal.  Left Ear: External ear normal.  Eyes: . Pupils are equal, round, and reactive to light. Conjunctivae and EOM are normal Neck: Normal range of motion. Neck supple.  Cardiovascular: Normal rate and regular rhythm.   Pulmonary/Chest: Effort normal and breath sounds normal.  Abd:  Soft, NT, ND, + BS Neurological: Pt is alert. Not confused , motor grossly intact Skin: Skin is warm. No rash, no LE edema except for trace of ankle effusion? Psychiatric: Pt behavior is normal. No agitation.     Assessment & Plan:

## 2014-03-25 NOTE — Assessment & Plan Note (Signed)
stable overall by history and exam, recent data reviewed with pt, and pt to continue medical treatment as before,  to f/u any worsening symptoms or concerns SpO2 Readings from Last 3 Encounters:  03/25/14 93%  02/20/14 98%  02/10/14 97%

## 2014-03-30 ENCOUNTER — Telehealth: Payer: Self-pay | Admitting: Internal Medicine

## 2014-03-30 NOTE — Telephone Encounter (Signed)
Pt requesting prescription for her mouthwash, pt states pharmacy has no record of this. Pls advise. Pt requests a call [phone]

## 2014-03-31 ENCOUNTER — Ambulatory Visit: Payer: Medicare Other | Admitting: Internal Medicine

## 2014-03-31 MED ORDER — MAGIC MOUTHWASH: 5.0000 mL | Freq: Four times a day (QID) | ORAL | Status: DC | PRN

## 2014-03-31 NOTE — Telephone Encounter (Signed)
Done erx 

## 2014-04-01 NOTE — Telephone Encounter (Signed)
Faxed hardcopy to [facility] and called the patient to inform sent in requested prescription.

## 2014-04-20 ENCOUNTER — Other Ambulatory Visit: Payer: Self-pay | Admitting: Internal Medicine

## 2014-04-30 ENCOUNTER — Other Ambulatory Visit (INDEPENDENT_AMBULATORY_CARE_PROVIDER_SITE_OTHER): Payer: Medicare Other

## 2014-04-30 ENCOUNTER — Encounter: Payer: Self-pay | Admitting: Internal Medicine

## 2014-04-30 ENCOUNTER — Other Ambulatory Visit: Payer: Self-pay | Admitting: Internal Medicine

## 2014-04-30 ENCOUNTER — Telehealth: Payer: Self-pay

## 2014-04-30 DIAGNOSIS — I10 Essential (primary) hypertension: Secondary | ICD-10-CM | POA: Diagnosis not present

## 2014-04-30 DIAGNOSIS — D509 Iron deficiency anemia, unspecified: Secondary | ICD-10-CM

## 2014-04-30 DIAGNOSIS — D500 Iron deficiency anemia secondary to blood loss (chronic): Secondary | ICD-10-CM

## 2014-04-30 LAB — CBC WITH DIFFERENTIAL/PLATELET
BASOS ABS: 0 10*3/uL (ref 0.0–0.1)
Basophils Relative: 0.1 % (ref 0.0–3.0)
EOS ABS: 0.4 10*3/uL (ref 0.0–0.7)
Eosinophils Relative: 4.1 % (ref 0.0–5.0)
HCT: 27.8 % — ABNORMAL LOW (ref 36.0–46.0)
Hemoglobin: 8.6 g/dL — ABNORMAL LOW (ref 12.0–15.0)
Lymphocytes Relative: 37.2 % (ref 12.0–46.0)
Lymphs Abs: 3.5 10*3/uL (ref 0.7–4.0)
MCHC: 31.1 g/dL (ref 30.0–36.0)
MONOS PCT: 12.8 % — AB (ref 3.0–12.0)
Monocytes Absolute: 1.2 10*3/uL — ABNORMAL HIGH (ref 0.1–1.0)
NEUTROS PCT: 45.8 % (ref 43.0–77.0)
Neutro Abs: 4.3 10*3/uL (ref 1.4–7.7)
PLATELETS: 411 10*3/uL — AB (ref 150.0–400.0)
RBC: 4 Mil/uL (ref 3.87–5.11)
RDW: 17.4 % — ABNORMAL HIGH (ref 11.5–15.5)
WBC: 9.5 10*3/uL (ref 4.0–10.5)

## 2014-04-30 LAB — BASIC METABOLIC PANEL
BUN: 10 mg/dL (ref 6–23)
CALCIUM: 9.9 mg/dL (ref 8.4–10.5)
CO2: 27 meq/L (ref 19–32)
Chloride: 102 mEq/L (ref 96–112)
Creatinine, Ser: 1 mg/dL (ref 0.4–1.2)
GFR: 72.82 mL/min (ref >60.00)
Glucose, Bld: 95 mg/dL (ref 70–99)
POTASSIUM: 3.9 meq/L (ref 3.5–5.1)
SODIUM: 134 meq/L — AB (ref 135–145)

## 2014-04-30 LAB — IBC PANEL
Iron: 17 ug/dL — ABNORMAL LOW (ref 42–145)
Saturation Ratios: 4 % — ABNORMAL LOW (ref 20.0–50.0)
Transferrin: 307 mg/dL (ref 212.0–360.0)

## 2014-04-30 NOTE — Telephone Encounter (Signed)
Patient informed of [facility] instructions.

## 2014-04-30 NOTE — Telephone Encounter (Signed)
Patient is at the front desk stating having some chest congestion, cough and chest tightness.  She was in the building to do lab work and came by and wondered if either send something in or OTC.

## 2014-04-30 NOTE — Telephone Encounter (Signed)
Pt ok to take Delsym OTC for cough, and/or Mucinex (or it's generic off brand) for congestion, and tylenol as needed for pain.  Consider OV for fever, worsening pain, prod cough or sob

## 2014-05-04 ENCOUNTER — Telehealth: Payer: Self-pay | Admitting: Internal Medicine

## 2014-05-04 NOTE — Telephone Encounter (Signed)
emmi mailed  °

## 2014-05-05 ENCOUNTER — Encounter: Payer: Self-pay | Admitting: Gastroenterology

## 2014-06-03 ENCOUNTER — Other Ambulatory Visit: Payer: Self-pay | Admitting: Internal Medicine

## 2014-06-03 ENCOUNTER — Ambulatory Visit: Payer: Medicare Other | Admitting: Internal Medicine

## 2014-06-03 NOTE — Telephone Encounter (Signed)
Faxed hardcopy for alprazolam to [facility]

## 2014-06-03 NOTE — Telephone Encounter (Signed)
Done hardcopy to [facility]  Note rx is dated for feb 17 b/c not due for next refill until then

## 2014-06-17 ENCOUNTER — Ambulatory Visit: Payer: Self-pay | Admitting: Internal Medicine

## 2014-06-19 ENCOUNTER — Ambulatory Visit: Payer: Self-pay | Admitting: Gastroenterology

## 2014-07-21 DIAGNOSIS — H18411 Arcus senilis, right eye: Secondary | ICD-10-CM | POA: Diagnosis not present

## 2014-07-21 DIAGNOSIS — H2511 Age-related nuclear cataract, right eye: Secondary | ICD-10-CM | POA: Diagnosis not present

## 2014-07-21 DIAGNOSIS — H2512 Age-related nuclear cataract, left eye: Secondary | ICD-10-CM | POA: Diagnosis not present

## 2014-07-21 DIAGNOSIS — H02839 Dermatochalasis of unspecified eye, unspecified eyelid: Secondary | ICD-10-CM | POA: Diagnosis not present

## 2014-08-03 ENCOUNTER — Telehealth: Payer: Self-pay | Admitting: Internal Medicine

## 2014-08-03 ENCOUNTER — Ambulatory Visit (INDEPENDENT_AMBULATORY_CARE_PROVIDER_SITE_OTHER): Payer: Medicare Other | Admitting: Gastroenterology

## 2014-08-03 ENCOUNTER — Encounter: Payer: Self-pay | Admitting: Gastroenterology

## 2014-08-03 ENCOUNTER — Other Ambulatory Visit (INDEPENDENT_AMBULATORY_CARE_PROVIDER_SITE_OTHER): Payer: Medicare Other

## 2014-08-03 VITALS — BP 148/68 | HR 68 | Ht 64.0 in | Wt 205.0 lb

## 2014-08-03 DIAGNOSIS — D509 Iron deficiency anemia, unspecified: Secondary | ICD-10-CM | POA: Diagnosis not present

## 2014-08-03 LAB — CBC WITH DIFFERENTIAL/PLATELET
BASOS ABS: 0.1 10*3/uL (ref 0.0–0.1)
Basophils Relative: 0.8 % (ref 0.0–3.0)
Eosinophils Absolute: 0.2 10*3/uL (ref 0.0–0.7)
Eosinophils Relative: 2.9 % (ref 0.0–5.0)
HCT: 37.7 % (ref 36.0–46.0)
Hemoglobin: 12.3 g/dL (ref 12.0–15.0)
Lymphocytes Relative: 39.1 % (ref 12.0–46.0)
Lymphs Abs: 3.2 10*3/uL (ref 0.7–4.0)
MCHC: 32.7 g/dL (ref 30.0–36.0)
MCV: 77.5 fl — ABNORMAL LOW (ref 78.0–100.0)
MONOS PCT: 7.3 % (ref 3.0–12.0)
Monocytes Absolute: 0.6 10*3/uL (ref 0.1–1.0)
NEUTROS ABS: 4 10*3/uL (ref 1.4–7.7)
NEUTROS PCT: 49.9 % (ref 43.0–77.0)
Platelets: 306 10*3/uL (ref 150.0–400.0)
RBC: 4.86 Mil/uL (ref 3.87–5.11)
RDW: 22.3 % — AB (ref 11.5–15.5)
WBC: 8.1 10*3/uL (ref 4.0–10.5)

## 2014-08-03 LAB — IRON: Iron: 39 ug/dL — ABNORMAL LOW (ref 42–145)

## 2014-08-03 LAB — FERRITIN: Ferritin: 21.9 ng/mL (ref 10.0–291.0)

## 2014-08-03 MED ORDER — CLONIDINE HCL 0.3 MG PO TABS: 0.3000 mg | ORAL_TABLET | Freq: Two times a day (BID) | ORAL | Status: DC

## 2014-08-03 NOTE — Telephone Encounter (Signed)
[facility]

## 2014-08-03 NOTE — Telephone Encounter (Signed)
Daughter states that she went to the [facility] [address] last month and got a refill on clonidine.  She states patient normally gets a 3 month supply filled but could not afford it and only got a month supply filled.  Patient called to get the rest filled and the pharmacy stated that they had marked she got a 3 month supply filled last month.  Patient only has one more pill left and is requesting the office to contact the pharmacy in regards.

## 2014-08-03 NOTE — Progress Notes (Signed)
HPI: This is a very pleasant 74 year old woman    who was referred to me by Biagio Borg, MD  to evaluate  iron deficiency anemia.    I reviewed her blood work dating for the past 4 years or so. She has had gradually worsening anemia, microcytosis. 4 years ago her hemoglobin was normal and her MCV was normal. It is gradually worsened. Most recently hemoglobin 8.1 and MCV in the upper 60s.  She had a colonoscopy with Dr. Verl Blalock in 2012 and it was described as normal. He did that examination for routine risk screening and also anemia. She had EGD with Dr. Verl Blalock in 2011, he described a "normal EGD,?? Occult stricture at GE junction that was dilated with a dilator"  She does not see any overt GI bleeding, no vaginal bleeding, no nose bleeds.    Overall her weight has been stable.  She started once daily iron supplement about a month ago (caused greenish look to her bowels).  No colon cancer in family.  No stomach cancer in family.  She is NOT on xarelto now.  No pain after eating.  Does have a lot of gas at times.    Review of systems: Pertinent positive and negative review of systems were noted in the above HPI section. Complete review of systems was performed and was otherwise normal.    Past Medical History  Diagnosis Date  . HYPERLIPIDEMIA 04/18/2007  . Morbid obesity 12/09/2006  . ANEMIA 01/14/2009  . Chronic pain syndrome 02/16/2009  . HYPERTENSION 12/09/2006  . ACUTE URIS OF UNSPECIFIED SITE 10/22/2007  . ALLERGIC RHINITIS 12/09/2006  . ASTHMA 12/09/2006  . COPD 12/09/2006  . Glossitis 01/14/2009  . GERD 12/09/2006  . HIATAL HERNIA 03/01/2010  . OVERACTIVE BLADDER 04/14/2008  . DEGENERATIVE JOINT DISEASE 12/09/2006  . LOW BACK PAIN 12/09/2006  . OSTEOPOROSIS 12/09/2006  . SYNCOPE 11/05/2008  . DIZZINESS 03/30/2010  . INSOMNIA-SLEEP DISORDER-UNSPEC 11/05/2008  . HYPERSOMNIA 08/31/2008  . PALPITATIONS, RECURRENT 01/27/2010  . FREQUENCY, URINARY 03/30/2010  .  Nonspecific (abnormal) findings on radiological and other examination of body structure 11/05/2008  . TRANSIENT ISCHEMIC ATTACK, HX OF 12/09/2006  . HELICOBACTER PYLORI GASTRITIS, HX OF 12/09/2006  . ABNORMAL CHEST XRAY 11/05/2008  . DYSPNEA 01/27/2010    with heavy exertion  . Arthritis     Past Surgical History  Procedure Laterality Date  . Tubal ligation    . Lumbar disc surgery    . Colonoscopy    . Tonsillectomy      as a child  . Total hip arthroplasty Right 11/06/2012    Procedure: RIGHT TOTAL HIP ARTHROPLASTY WITH ACETABULAR AUTOGRAFT;  Surgeon: Gearlean Alf, MD;  Location: [facility];  Service: Orthopedics;  Laterality: Right;  . Total hip arthroplasty Left 02/18/2014    Procedure: LEFT TOTAL HIP ARTHROPLASTY;  Surgeon: Gearlean Alf, MD;  Location: [facility];  Service: Orthopedics;  Laterality: Left;    Current Outpatient Prescriptions  Medication Sig Dispense Refill  . acetaminophen (TYLENOL) 325 MG tablet Take 2 tablets (650 mg total) by mouth every 6 (six) hours as needed for mild pain (or Fever >/= 101). 40 tablet 0  . ALPRAZolam (XANAX) 0.25 MG tablet TAKE 1 TABLET BY MOUTH TWICE A DAY AS NEEDED 60 tablet 2  . Alum & Mag Hydroxide-Simeth (MAGIC MOUTHWASH) SOLN Take 5 mLs by mouth 4 (four) times daily as needed for mouth pain. 60 mL 5  . amLODipine (NORVASC) 10 MG tablet Take 10  mg by mouth every morning.    . benazepril (LOTENSIN) 40 MG tablet TAKE 1 TABLET BY MOUTH EVERY DAY 30 tablet 11  . bisacodyl (DULCOLAX) 10 MG suppository Place 1 suppository (10 mg total) rectally daily as needed for moderate constipation. 12 suppository 0  . cloNIDine (CATAPRES) 0.3 MG tablet Take 0.3 mg by mouth 2 (two) times daily.    Marland Kitchen docusate sodium 100 MG CAPS Take 100 mg by mouth 2 (two) times daily. 10 capsule 0  . furosemide (LASIX) 20 MG tablet Take 20 mg by mouth every morning.    . gabapentin (NEURONTIN) 300 MG capsule Take 300 mg by mouth 3 (three) times daily.    . irbesartan (AVAPRO) 300  MG tablet Take 1 tablet (300 mg total) by mouth daily. 90 tablet 3  . lovastatin (MEVACOR) 40 MG tablet Take 40 mg by mouth at bedtime.    . methocarbamol (ROBAXIN) 500 MG tablet Take 1 tablet (500 mg total) by mouth every 6 (six) hours as needed for muscle spasms. 80 tablet 0  . omeprazole (PRILOSEC) 20 MG capsule Take 40 mg by mouth every morning.     . ondansetron (ZOFRAN) 4 MG tablet Take 1 tablet (4 mg total) by mouth every 6 (six) hours as needed for nausea. 20 tablet 0  . oxybutynin (DITROPAN) 5 MG tablet Take 5 mg by mouth 3 (three) times daily.    Marland Kitchen oxyCODONE (OXY IR/ROXICODONE) 5 MG immediate release tablet Take 1-2 tablets (5-10 mg total) by mouth every 3 (three) hours as needed for moderate pain, severe pain or breakthrough pain. 80 tablet 0  . Polyethyl Glycol-Propyl Glycol (SYSTANE OP) Place 1 drop into both eyes. Three to four times daily.    . polyethylene glycol (MIRALAX / GLYCOLAX) packet Take 17 g by mouth daily as needed for mild constipation. 14 each 0  . rivaroxaban (XARELTO) 10 MG TABS tablet Take 1 tablet (10 mg total) by mouth daily with breakfast. Take Xarelto for two and a half more weeks, then discontinue Xarelto. Once the patient has completed the blood thinner regimen, then take a 325 mg Aspirin daily for three more weeks. 19 tablet 0  . traMADol (ULTRAM) 50 MG tablet Take 1-2 tablets (50-100 mg total) by mouth every 6 (six) hours as needed (mild pain). 60 tablet 1  . traZODone (DESYREL) 50 MG tablet Take 25-50 mg by mouth at bedtime as needed for sleep.     . [DISCONTINUED] lansoprazole (PREVACID) 30 MG capsule Take 1 capsule (30 mg total) by mouth daily. 90 capsule 3  . [DISCONTINUED] pantoprazole (PROTONIX) 40 MG tablet 1 tab by mouth twice per day 60 tablet 11   No current facility-administered medications for this visit.   Facility-Administered Medications Ordered in Other Visits  Medication Dose Route Frequency Provider Last Rate Last Dose  . tranexamic acid  (CYKLOKAPRON) 2,000 mg in sodium chloride 0.9 % 50 mL Topical Application  1,610 mg Topical Once [employer], PA-C        Allergies as of 08/03/2014  . (No Known Allergies)    Family History  Problem Relation Age of Onset  . Heart disease Mother     Had pacemaker  . Lung cancer Father   . Hypertension Brother   . Diabetes Neg Hx   . Colon cancer Neg Hx   . Colon polyps Neg Hx   . Kidney disease Neg Hx   . Gallbladder disease Neg Hx   . Esophageal cancer Neg Hx  History   Social History  . Marital Status: Married    Spouse Name: N/A  . Number of Children: 10  . Years of Education: N/A   Occupational History  . Retired    Social History Main Topics  . Smoking status: Former Smoker -- 0.50 packs/day for 25 years    Types: Cigarettes    Quit date: 10/29/1972  . Smokeless tobacco: Never Used  . Alcohol Use: No  . Drug Use: No  . Sexual Activity: Not on file   Other Topics Concern  . Not on file   Social History Narrative       Physical Exam: BP 148/68 mmHg  Pulse 68  Ht 5\' 4"  (1.626 m)  Wt 205 lb (92.987 kg)  BMI 35.17 kg/m2 Constitutional: generally well-appearing Psychiatric: alert and oriented x3 Eyes: extraocular movements intact Mouth: oral pharynx moist, no lesions Neck: supple no lymphadenopathy Cardiovascular: heart regular rate and rhythm Lungs: clear to auscultation bilaterally Abdomen: soft, nontender, nondistended, no obvious ascites, no peritoneal signs, normal bowel sounds Extremities: no lower extremity edema bilaterally Skin: no lesions on visible extremities    Assessment and plan: 74 y.o. female with  iron deficiency anemia  her blood counts have gradually decreased over the past 3 or 4 years very most recent hemoglobin was in the low eights. She was started on iron and began taking it about a month ago. She will have a repeat labs today including CBC, ferritin, iron level as well as a celiac sprue panel. Pending those  results we will likely schedule her for colonoscopy plus minus upper endoscopy.   Cc: Biagio Borg, MD

## 2014-08-03 NOTE — Patient Instructions (Signed)
You will have labs checked today in the basement lab.  Please head down after you check out with the front desk  (cbc, iron, ferritin, celiac sprue panel). Pending the lab results, will schedule you for colonoscopy/EGD. Stay on iron once daily for now.

## 2014-08-24 DIAGNOSIS — H2511 Age-related nuclear cataract, right eye: Secondary | ICD-10-CM | POA: Diagnosis not present

## 2014-08-24 DIAGNOSIS — H25811 Combined forms of age-related cataract, right eye: Secondary | ICD-10-CM | POA: Diagnosis not present

## 2014-08-25 DIAGNOSIS — H2512 Age-related nuclear cataract, left eye: Secondary | ICD-10-CM | POA: Diagnosis not present

## 2014-09-02 ENCOUNTER — Other Ambulatory Visit (INDEPENDENT_AMBULATORY_CARE_PROVIDER_SITE_OTHER): Payer: Medicare Other

## 2014-09-02 ENCOUNTER — Ambulatory Visit (INDEPENDENT_AMBULATORY_CARE_PROVIDER_SITE_OTHER): Payer: Medicare Other | Admitting: Internal Medicine

## 2014-09-02 ENCOUNTER — Encounter: Payer: Self-pay | Admitting: Internal Medicine

## 2014-09-02 ENCOUNTER — Ambulatory Visit (INDEPENDENT_AMBULATORY_CARE_PROVIDER_SITE_OTHER)
Admission: RE | Admit: 2014-09-02 | Discharge: 2014-09-02 | Disposition: A | Discharge status: Home / Self Care | Payer: Medicare Other | Source: Ambulatory Visit | Attending: Internal Medicine | Admitting: Internal Medicine

## 2014-09-02 VITALS — BP 126/80 | HR 57 | Temp 98.3°F | Resp 18 | Ht 64.0 in | Wt 205.1 lb

## 2014-09-02 DIAGNOSIS — R079 Chest pain, unspecified: Secondary | ICD-10-CM

## 2014-09-02 DIAGNOSIS — Z0000 Encounter for general adult medical examination without abnormal findings: Secondary | ICD-10-CM

## 2014-09-02 DIAGNOSIS — D509 Iron deficiency anemia, unspecified: Secondary | ICD-10-CM

## 2014-09-02 DIAGNOSIS — I10 Essential (primary) hypertension: Secondary | ICD-10-CM

## 2014-09-02 DIAGNOSIS — R0789 Other chest pain: Secondary | ICD-10-CM | POA: Diagnosis not present

## 2014-09-02 DIAGNOSIS — K219 Gastro-esophageal reflux disease without esophagitis: Secondary | ICD-10-CM | POA: Diagnosis not present

## 2014-09-02 DIAGNOSIS — E785 Hyperlipidemia, unspecified: Secondary | ICD-10-CM

## 2014-09-02 DIAGNOSIS — J449 Chronic obstructive pulmonary disease, unspecified: Secondary | ICD-10-CM | POA: Diagnosis not present

## 2014-09-02 DIAGNOSIS — J45909 Unspecified asthma, uncomplicated: Secondary | ICD-10-CM | POA: Diagnosis not present

## 2014-09-02 LAB — CBC WITH DIFFERENTIAL/PLATELET
BASOS PCT: 0.7 % (ref 0.0–3.0)
Basophils Absolute: 0.1 10*3/uL (ref 0.0–0.1)
EOS ABS: 0.2 10*3/uL (ref 0.0–0.7)
EOS PCT: 2.3 % (ref 0.0–5.0)
HEMATOCRIT: 36.9 % (ref 36.0–46.0)
Hemoglobin: 12.3 g/dL (ref 12.0–15.0)
Lymphocytes Relative: 34.1 % (ref 12.0–46.0)
Lymphs Abs: 2.7 10*3/uL (ref 0.7–4.0)
MCHC: 33.3 g/dL (ref 30.0–36.0)
MCV: 79.2 fl (ref 78.0–100.0)
MONO ABS: 0.9 10*3/uL (ref 0.1–1.0)
Monocytes Relative: 11.7 % (ref 3.0–12.0)
Neutro Abs: 4 10*3/uL (ref 1.4–7.7)
Neutrophils Relative %: 51.2 % (ref 43.0–77.0)
Platelets: 317 10*3/uL (ref 150.0–400.0)
RBC: 4.66 Mil/uL (ref 3.87–5.11)
RDW: 15.4 % (ref 11.5–15.5)
WBC: 7.8 10*3/uL (ref 4.0–10.5)

## 2014-09-02 LAB — HEPATIC FUNCTION PANEL
ALK PHOS: 146 U/L — AB (ref 39–117)
ALT: 12 U/L (ref 0–35)
AST: 17 U/L (ref 0–37)
Albumin: 4.1 g/dL (ref 3.5–5.2)
BILIRUBIN DIRECT: 0.1 mg/dL (ref 0.0–0.3)
BILIRUBIN TOTAL: 0.2 mg/dL (ref 0.2–1.2)
TOTAL PROTEIN: 8.9 g/dL — AB (ref 6.0–8.3)

## 2014-09-02 LAB — BASIC METABOLIC PANEL
BUN: 12 mg/dL (ref 6–23)
CHLORIDE: 101 meq/L (ref 96–112)
CO2: 30 mEq/L (ref 19–32)
CREATININE: 0.89 mg/dL (ref 0.40–1.20)
Calcium: 10.8 mg/dL — ABNORMAL HIGH (ref 8.4–10.5)
GFR: 79.39 mL/min (ref >60.00)
GLUCOSE: 84 mg/dL (ref 70–99)
Potassium: 4.5 mEq/L (ref 3.5–5.1)
Sodium: 137 mEq/L (ref 135–145)

## 2014-09-02 LAB — LIPID PANEL
Cholesterol: 166 mg/dL (ref 0–200)
HDL: 57.9 mg/dL (ref >39.00)
LDL Cholesterol: 92 mg/dL (ref 0–99)
NonHDL: 108.1
TRIGLYCERIDES: 83 mg/dL (ref 0.0–149.0)
Total CHOL/HDL Ratio: 3
VLDL: 16.6 mg/dL (ref 0.0–40.0)

## 2014-09-02 LAB — TSH: TSH: 0.78 u[IU]/mL (ref 0.35–4.50)

## 2014-09-02 MED ORDER — PANTOPRAZOLE SODIUM 40 MG PO TBEC: 40.0000 mg | DELAYED_RELEASE_TABLET | Freq: Every day | ORAL | Status: DC

## 2014-09-02 MED ORDER — KETOROLAC TROMETHAMINE 30 MG/ML IJ SOLN
30.0000 mg | Freq: Once | INTRAMUSCULAR | Status: AC
Start: 2014-09-02 — End: 2014-09-02
  Administered 2014-09-02: 30 mg via INTRAMUSCULAR

## 2014-09-02 NOTE — Assessment & Plan Note (Signed)
D/w pt, encouraged to f/u with recommenation for colonoscopy

## 2014-09-02 NOTE — Assessment & Plan Note (Signed)
BP Readings from Last 3 Encounters:  09/02/14 126/80  08/03/14 148/68  03/25/14 156/72   stable overall by history and exam, recent data reviewed with pt, and pt to continue medical treatment as before,  to f/u any worsening symptoms or concerns

## 2014-09-02 NOTE — Assessment & Plan Note (Signed)
With daily breakthrough sytmptoms - to/ d/c prilosec, ok for protonix 40 qd,  to f/u any worsening symptoms or concerns

## 2014-09-02 NOTE — Assessment & Plan Note (Signed)
6 days persistent right lateral with wincing pain today to movement, for cxr/rib films, also toradol IM

## 2014-09-02 NOTE — Progress Notes (Signed)
Subjective:    Patient ID: Wendy Torres, female    DOB: May 26, 1940, 74 y.o.   MRN: [id number]  HPI Here for wellness and f/u;  Overall doing ok;  Pt denies Chest pain, worsening SOB, DOE, wheezing, orthopnea, PND, worsening LE edema, palpitations, dizziness or syncope.  Pt denies neurological change such as new headache, facial or extremity weakness.  Pt denies polydipsia, polyuria, or low sugar symptoms. Pt states overall good compliance with treatment and medications, good tolerability, and has been trying to follow appropriate diet.  Pt denies worsening depressive symptoms, suicidal ideation or panic. No fever, night sweats, wt loss, loss of appetite, or other constitutional symptoms.  Pt states good ability with ADL's, has low fall risk, home safety reviewed and adequate, no other significant changes in hearing or vision, and only occasionally active with exercise.  C/o acute right lateral chest pain after standing up from commode 6 days ago, sharp, no radation, worse to stand, twist and deep breaths or lying on the right side. Has had worsening reflux, but no abd pain, dysphagia, n/v, bowel change or blood. prilosec 40 mg not working well.  Has ongoing iron deficiency, pt mentions occas BRBPR on tissue only,  No recent abd pain, has been recommended for f/u colonscopy per GI but pt has been deferring.  Walks with cane, no recent falls. Pt unable to give urine specimen today, just went prior to visit Past Medical History  Diagnosis Date  . HYPERLIPIDEMIA 04/18/2007  . Morbid obesity 12/09/2006  . ANEMIA 01/14/2009  . Chronic pain syndrome 02/16/2009  . HYPERTENSION 12/09/2006  . ACUTE URIS OF UNSPECIFIED SITE 10/22/2007  . ALLERGIC RHINITIS 12/09/2006  . ASTHMA 12/09/2006  . COPD 12/09/2006  . Glossitis 01/14/2009  . GERD 12/09/2006  . HIATAL HERNIA 03/01/2010  . OVERACTIVE BLADDER 04/14/2008  . DEGENERATIVE JOINT DISEASE 12/09/2006  . LOW BACK PAIN 12/09/2006  . OSTEOPOROSIS 12/09/2006  . SYNCOPE  11/05/2008  . DIZZINESS 03/30/2010  . INSOMNIA-SLEEP DISORDER-UNSPEC 11/05/2008  . HYPERSOMNIA 08/31/2008  . PALPITATIONS, RECURRENT 01/27/2010  . FREQUENCY, URINARY 03/30/2010  . Nonspecific (abnormal) findings on radiological and other examination of body structure 11/05/2008  . TRANSIENT ISCHEMIC ATTACK, HX OF 12/09/2006  . HELICOBACTER PYLORI GASTRITIS, HX OF 12/09/2006  . ABNORMAL CHEST XRAY 11/05/2008  . DYSPNEA 01/27/2010    with heavy exertion  . Arthritis    Past Surgical History  Procedure Laterality Date  . Tubal ligation    . Lumbar disc surgery    . Colonoscopy    . Tonsillectomy      as a child  . Total hip arthroplasty Right 11/06/2012    Procedure: RIGHT TOTAL HIP ARTHROPLASTY WITH ACETABULAR AUTOGRAFT;  Surgeon: Gearlean Alf, MD;  Location: [facility];  Service: Orthopedics;  Laterality: Right;  . Total hip arthroplasty Left 02/18/2014    Procedure: LEFT TOTAL HIP ARTHROPLASTY;  Surgeon: Gearlean Alf, MD;  Location: [facility];  Service: Orthopedics;  Laterality: Left;    reports that she quit smoking about 41 years ago. Her smoking use included Cigarettes. She has a 12.5 pack-year smoking history. She has never used smokeless tobacco. She reports that she does not drink alcohol or use illicit drugs. family history includes Heart disease in her mother; Hypertension in her brother; Lung cancer in her father. There is no history of Diabetes, Colon cancer, Colon polyps, Kidney disease, Gallbladder disease, or Esophageal cancer. No Known Allergies Current Outpatient Prescriptions on File Prior to Visit  Medication Sig  Dispense Refill  . acetaminophen (TYLENOL) 325 MG tablet Take 2 tablets (650 mg total) by mouth every 6 (six) hours as needed for mild pain (or Fever >/= 101). 40 tablet 0  . ALPRAZolam (XANAX) 0.25 MG tablet TAKE 1 TABLET BY MOUTH TWICE A DAY AS NEEDED 60 tablet 2  . Alum & Mag Hydroxide-Simeth (MAGIC MOUTHWASH) SOLN Take 5 mLs by mouth 4 (four) times daily as needed for  mouth pain. 60 mL 5  . amLODipine (NORVASC) 10 MG tablet Take 10 mg by mouth every morning.    . benazepril (LOTENSIN) 40 MG tablet TAKE 1 TABLET BY MOUTH EVERY DAY 30 tablet 11  . bisacodyl (DULCOLAX) 10 MG suppository Place 1 suppository (10 mg total) rectally daily as needed for moderate constipation. 12 suppository 0  . cloNIDine (CATAPRES) 0.3 MG tablet Take 1 tablet (0.3 mg total) by mouth 2 (two) times daily. 60 tablet 5  . docusate sodium 100 MG CAPS Take 100 mg by mouth 2 (two) times daily. 10 capsule 0  . furosemide (LASIX) 20 MG tablet Take 20 mg by mouth every morning.    . gabapentin (NEURONTIN) 300 MG capsule Take 300 mg by mouth 3 (three) times daily.    . irbesartan (AVAPRO) 300 MG tablet Take 1 tablet (300 mg total) by mouth daily. 90 tablet 3  . lovastatin (MEVACOR) 40 MG tablet Take 40 mg by mouth at bedtime.    . methocarbamol (ROBAXIN) 500 MG tablet Take 1 tablet (500 mg total) by mouth every 6 (six) hours as needed for muscle spasms. 80 tablet 0  . ondansetron (ZOFRAN) 4 MG tablet Take 1 tablet (4 mg total) by mouth every 6 (six) hours as needed for nausea. 20 tablet 0  . oxybutynin (DITROPAN) 5 MG tablet Take 5 mg by mouth 3 (three) times daily.    Marland Kitchen oxyCODONE (OXY IR/ROXICODONE) 5 MG immediate release tablet Take 1-2 tablets (5-10 mg total) by mouth every 3 (three) hours as needed for moderate pain, severe pain or breakthrough pain. 80 tablet 0  . Polyethyl Glycol-Propyl Glycol (SYSTANE OP) Place 1 drop into both eyes. Three to four times daily.    . polyethylene glycol (MIRALAX / GLYCOLAX) packet Take 17 g by mouth daily as needed for mild constipation. 14 each 0  . rivaroxaban (XARELTO) 10 MG TABS tablet Take 1 tablet (10 mg total) by mouth daily with breakfast. Take Xarelto for two and a half more weeks, then discontinue Xarelto. Once the patient has completed the blood thinner regimen, then take a 325 mg Aspirin daily for three more weeks. 19 tablet 0  . traMADol  (ULTRAM) 50 MG tablet Take 1-2 tablets (50-100 mg total) by mouth every 6 (six) hours as needed (mild pain). 60 tablet 1  . traZODone (DESYREL) 50 MG tablet Take 25-50 mg by mouth at bedtime as needed for sleep.     . [DISCONTINUED] lansoprazole (PREVACID) 30 MG capsule Take 1 capsule (30 mg total) by mouth daily. 90 capsule 3   Current Facility-Administered Medications on File Prior to Visit  Medication Dose Route Frequency Provider Last Rate Last Dose  . tranexamic acid (CYKLOKAPRON) 2,000 mg in sodium chloride 0.9 % 50 mL Topical Application  2,423 mg Topical Once [employer], PA-C        Review of Systems Constitutional: Negative for increased diaphoresis, other activity, appetite or siginficant weight change other than noted HENT: Negative for worsening hearing loss, ear pain, facial swelling, mouth sores and neck stiffness.  Eyes: Negative for other worsening pain, redness or visual disturbance.  Respiratory: Negative for shortness of breath and wheezing  Cardiovascular: Negative for chest pain and palpitations.  Gastrointestinal: Negative for diarrhea, blood in stool, abdominal distention or other pain Genitourinary: Negative for hematuria, flank pain or change in urine volume.  Musculoskeletal: Negative for myalgias or other joint complaints.  Skin: Negative for color change and wound or drainage.  Neurological: Negative for syncope and numbness. other than noted Hematological: Negative for adenopathy. or other swelling Psychiatric/Behavioral: Negative for hallucinations, SI, self-injury, decreased concentration or other worsening agitation.      Objective:   Physical Exam BP 126/80 mmHg  Pulse 57  Temp(Src) 98.3 F (36.8 C) (Oral)  Resp 18  Ht 5\' 4"  (1.626 m)  Wt 205 lb 1.9 oz (93.042 kg)  BMI 35.19 kg/m2  SpO2 96% VS noted,  Constitutional: Pt is oriented to person, place, and time. Appears well-developed and well-nourished, in no significant distress Head:  Normocephalic and atraumatic.  Right Ear: External ear normal.  Left Ear: External ear normal.  Nose: Nose normal.  Mouth/Throat: Oropharynx is clear and moist.  Eyes: Conjunctivae and EOM are normal. Pupils are equal, round, and reactive to light.  Neck: Normal range of motion. Neck supple. No JVD present. No tracheal deviation present or significant neck LA or mass Cardiovascular: Normal rate, regular rhythm, normal heart sounds and intact distal pulses.   Pulmonary/Chest: Effort normal and breath sounds without rales or wheezing  Abdominal: Soft. Bowel sounds are normal. NT. No HSM  Musculoskeletal: Normal range of motion. Exhibits no edema.  Lymphadenopathy:  Has no cervical adenopathy.  Neurological: Pt is alert and oriented to person, place, and time. Pt has normal reflexes. No cranial nerve deficit. Motor grossly intact Skin: Skin is warm and dry. No rash noted.  Psychiatric:  Has normal mood and affect. Behavior is normal.     Assessment & Plan:

## 2014-09-02 NOTE — Progress Notes (Signed)
Pre visit review using our clinic review tool, if applicable. No additional management support is needed unless otherwise documented below in the visit note. 

## 2014-09-02 NOTE — Assessment & Plan Note (Signed)

## 2014-09-02 NOTE — Patient Instructions (Addendum)
You had the pain shot today (toradol)  Please take all new medication as prescribed - the protonix (and stop the prilosec)  Please continue all other medications as before, and refills have been done if requested.  Please have the pharmacy call with any other refills you may need.  Please continue your efforts at being more active, low cholesterol diet, and weight control.  You are otherwise up to date with prevention measures today.  Please keep your appointments with your specialists as you may have planned  Please go to the XRAY Department in the Basement (go straight as you get off the elevator) for the x-ray testing  Please go to the LAB in the Basement (turn left off the elevator) for the tests to be done today  You will be contacted by phone if any changes need to be made immediately.  Otherwise, you will receive a letter about your results with an explanation, but please check with MyChart first.  Please remember to sign up for MyChart if you have not done so, as this will be important to you in the future with finding out test results, communicating by private email, and scheduling acute appointments online when needed.  Please return in 6 months, or sooner if needed

## 2014-09-03 ENCOUNTER — Encounter: Payer: Self-pay | Admitting: Internal Medicine

## 2014-09-14 DIAGNOSIS — H2512 Age-related nuclear cataract, left eye: Secondary | ICD-10-CM | POA: Diagnosis not present

## 2014-09-14 DIAGNOSIS — H25812 Combined forms of age-related cataract, left eye: Secondary | ICD-10-CM | POA: Diagnosis not present

## 2014-09-14 DIAGNOSIS — H2511 Age-related nuclear cataract, right eye: Secondary | ICD-10-CM | POA: Diagnosis not present

## 2014-09-21 ENCOUNTER — Telehealth: Payer: Self-pay | Admitting: Internal Medicine

## 2014-09-21 NOTE — Telephone Encounter (Signed)
Patient's home care nurse asked the patient's daughter why the patient is no longer on an arthritis medication. Patient's daughter states she used to take one, but no longer does and they do not know why. CB# [phone]

## 2014-09-22 MED ORDER — TRAMADOL HCL 50 MG PO TABS: 50.0000 mg | ORAL_TABLET | Freq: Four times a day (QID) | ORAL | Status: DC | PRN

## 2014-09-22 NOTE — Telephone Encounter (Signed)
Pt is requesting Rx to treat arthritis pain. Pt says she was on a pain medication previously (I think she is requesting Oxycodone)

## 2014-09-22 NOTE — Telephone Encounter (Signed)
I no longer practice chronic pain medicine, and I no longer prescribed oxycodone  Ok for tramadol - Done hardcopy to [facility]  I can refer to pain management if pt wants to do this

## 2014-09-23 NOTE — Telephone Encounter (Signed)
Pt advised and is satisfied with prescribed medication. Rx faxed to pharmacy

## 2014-09-29 ENCOUNTER — Telehealth: Payer: Self-pay | Admitting: Gastroenterology

## 2014-09-29 NOTE — Telephone Encounter (Signed)
Patient will call back if she does not have a ride to the appointment on 10/02/14

## 2014-09-29 NOTE — Telephone Encounter (Signed)
No pain. She sees blood when she wipes. Bowels movements are normal to her. This started 2 weeks ago. Appointment scheduled for evaluation.

## 2014-10-02 ENCOUNTER — Ambulatory Visit: Payer: Self-pay | Admitting: Nurse Practitioner

## 2014-10-19 ENCOUNTER — Encounter: Payer: Self-pay | Admitting: Physician Assistant

## 2014-10-19 ENCOUNTER — Ambulatory Visit (INDEPENDENT_AMBULATORY_CARE_PROVIDER_SITE_OTHER): Payer: Medicare Other | Admitting: Physician Assistant

## 2014-10-19 VITALS — BP 130/70 | HR 60 | Ht 64.0 in | Wt 207.0 lb

## 2014-10-19 DIAGNOSIS — K625 Hemorrhage of anus and rectum: Secondary | ICD-10-CM

## 2014-10-19 DIAGNOSIS — K649 Unspecified hemorrhoids: Secondary | ICD-10-CM | POA: Diagnosis not present

## 2014-10-19 DIAGNOSIS — D509 Iron deficiency anemia, unspecified: Secondary | ICD-10-CM | POA: Diagnosis not present

## 2014-10-19 MED ORDER — HYDROCORTISONE ACE-PRAMOXINE 2.5-1 % RE CREA: TOPICAL_CREAM | RECTAL | Status: DC

## 2014-10-19 MED ORDER — MOVIPREP 100 G PO SOLR: 1.0000 | ORAL | Status: DC

## 2014-10-19 NOTE — Patient Instructions (Signed)
We sent a prescription for Analpram ( Hydrocortisone Pramoxine )  cream to [facility].  Use Tucks wipes.  You have been scheduled for an endoscopy and colonoscopy. Please follow the written instructions given to you at your visit today. Please pick up your prep supplies at the pharmacy within the next 1-3 days. [facility]. If you use inhalers (even only as needed), please bring them with you on the day of your procedure. Your physician has requested that you go to www.[email or web address].com and enter the access code given to you at your visit today. This web site gives a general overview about your procedure. However, you should still follow specific instructions given to you by our office regarding your preparation for the procedure.

## 2014-10-19 NOTE — Progress Notes (Signed)
Patient ID: Wendy Torres, female   DOB: 1940-07-06, 74 y.o.   MRN: [id number]     History of Present Illness: Wendy Torres is a pleasant 74 year old female who was recently evaluated by Dr. Ardis Hughs in April 2016. She has had a gradually worsening microcytic anemia. 4 years ago her hemoglobin was normal and her MCV was normal gradually worsened and when seen in April her hemoglobin was 8.1 and her MCV was in the upper 60s. She had a colonoscopy with Dr. Verl Blalock in 2012 and it was described as normal. She had an EGD with Dr. 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 At that visit she was sent for repeat blood work and based on her blood work she is was advised to schedule a colonoscopy and EGD. Unfortunately she has not gotten around to doing so and would like to do so at this time. She reports that she has had some bloody spotting on the toilet tissue with bowel movements with some rectal itching and burning.  Her past medical history is significant for GERD, history of H. pylori gastritis. Anemia. Status post bilateral total hip arthroplasties, anxiety, overactive bladder, dizziness, osteoporosis, peripheral edema, DJD of her knees, olecranon bursitis, cervical radiculopathy, hiatal hernia, palpitations, chronic pain syndrome, insomnia, allergic rhinitis, and asthma. She was previously on Xarelto when she had her hip surgery but states she has been off Xarelto since November prior to Thanksgiving.   Past Medical History  Diagnosis Date  . HYPERLIPIDEMIA 04/18/2007  . Morbid obesity 12/09/2006  . ANEMIA 01/14/2009  . Chronic pain syndrome 02/16/2009  . HYPERTENSION 12/09/2006  . ACUTE URIS OF UNSPECIFIED SITE 10/22/2007  . ALLERGIC RHINITIS 12/09/2006  . ASTHMA 12/09/2006  . COPD 12/09/2006  . Glossitis 01/14/2009  . GERD 12/09/2006  . HIATAL HERNIA 03/01/2010  . OVERACTIVE BLADDER 04/14/2008  .  DEGENERATIVE JOINT DISEASE 12/09/2006  . LOW BACK PAIN 12/09/2006  . OSTEOPOROSIS 12/09/2006  . SYNCOPE 11/05/2008  . DIZZINESS 03/30/2010  . INSOMNIA-SLEEP DISORDER-UNSPEC 11/05/2008  . HYPERSOMNIA 08/31/2008  . PALPITATIONS, RECURRENT 01/27/2010  . FREQUENCY, URINARY 03/30/2010  . Nonspecific (abnormal) findings on radiological and other examination of body structure 11/05/2008  . TRANSIENT ISCHEMIC ATTACK, HX OF 12/09/2006  . HELICOBACTER PYLORI GASTRITIS, HX OF 12/09/2006  . ABNORMAL CHEST XRAY 11/05/2008  . DYSPNEA 01/27/2010    with heavy exertion  . Arthritis     Past Surgical History  Procedure Laterality Date  . Tubal ligation    . Lumbar disc surgery    . Colonoscopy    . Tonsillectomy      as a child  . Total hip arthroplasty Right 11/06/2012    Procedure: RIGHT TOTAL HIP ARTHROPLASTY WITH ACETABULAR AUTOGRAFT;  Surgeon: Gearlean Alf, MD;  Location: [facility];  Service: Orthopedics;  Laterality: Right;  . Total hip arthroplasty Left 02/18/2014    Procedure: LEFT TOTAL HIP ARTHROPLASTY;  Surgeon: Gearlean Alf, MD;  Location: [facility];  Service: Orthopedics;  Laterality: Left;   Family History  Problem Relation Age of Onset  . Heart disease Mother     Had pacemaker  . Lung cancer Father   . Hypertension Brother   . Diabetes Neg Hx   . Colon cancer Neg Hx   . Colon polyps Neg Hx   . Kidney disease Neg Hx   . Gallbladder disease Neg Hx   .  Esophageal cancer Neg Hx    History  Substance Use Topics  . Smoking status: Former Smoker -- 0.50 packs/day for 25 years    Types: Cigarettes    Quit date: 10/29/1972  . Smokeless tobacco: Never Used  . Alcohol Use: No   Current Outpatient Prescriptions  Medication Sig Dispense Refill  . acetaminophen (TYLENOL) 325 MG tablet Take 2 tablets (650 mg total) by mouth every 6 (six) hours as needed for mild pain (or Fever >/= 101). 40 tablet 0  . ALPRAZolam (XANAX) 0.25 MG tablet TAKE 1 TABLET BY MOUTH TWICE A DAY AS NEEDED 60 tablet 2  .  Alum & Mag Hydroxide-Simeth (MAGIC MOUTHWASH) SOLN Take 5 mLs by mouth 4 (four) times daily as needed for mouth pain. 60 mL 5  . amLODipine (NORVASC) 10 MG tablet Take 10 mg by mouth every morning.    . benazepril (LOTENSIN) 40 MG tablet TAKE 1 TABLET BY MOUTH EVERY DAY 30 tablet 11  . bisacodyl (DULCOLAX) 10 MG suppository Place 1 suppository (10 mg total) rectally daily as needed for moderate constipation. 12 suppository 0  . cloNIDine (CATAPRES) 0.3 MG tablet Take 1 tablet (0.3 mg total) by mouth 2 (two) times daily. 60 tablet 5  . docusate sodium 100 MG CAPS Take 100 mg by mouth 2 (two) times daily. 10 capsule 0  . furosemide (LASIX) 20 MG tablet Take 20 mg by mouth every morning.    . gabapentin (NEURONTIN) 300 MG capsule Take 300 mg by mouth 3 (three) times daily.    . irbesartan (AVAPRO) 300 MG tablet Take 1 tablet (300 mg total) by mouth daily. 90 tablet 3  . lovastatin (MEVACOR) 40 MG tablet Take 40 mg by mouth at bedtime.    . methocarbamol (ROBAXIN) 500 MG tablet Take 1 tablet (500 mg total) by mouth every 6 (six) hours as needed for muscle spasms. 80 tablet 0  . ondansetron (ZOFRAN) 4 MG tablet Take 1 tablet (4 mg total) by mouth every 6 (six) hours as needed for nausea. 20 tablet 0  . oxybutynin (DITROPAN) 5 MG tablet Take 5 mg by mouth 3 (three) times daily.    . pantoprazole (PROTONIX) 40 MG tablet Take 1 tablet (40 mg total) by mouth daily. 90 tablet 3  . Polyethyl Glycol-Propyl Glycol (SYSTANE OP) Place 1 drop into both eyes. Three to four times daily.    . polyethylene glycol (MIRALAX / GLYCOLAX) packet Take 17 g by mouth daily as needed for mild constipation. 14 each 0  . traMADol (ULTRAM) 50 MG tablet Take 1-2 tablets (50-100 mg total) by mouth every 6 (six) hours as needed (mild pain). 60 tablet 1  . traZODone (DESYREL) 50 MG tablet Take 25-50 mg by mouth at bedtime as needed for sleep.     . hydrocortisone-pramoxine (ANALPRAM HC) 2.5-1 % rectal cream Apply rectally twice  daily. 30 g 1  . MOVIPREP 100 G SOLR Take 1 kit (200 g total) by mouth as directed. 1 kit 0  . [DISCONTINUED] lansoprazole (PREVACID) 30 MG capsule Take 1 capsule (30 mg total) by mouth daily. 90 capsule 3   No current facility-administered medications for this visit.   Facility-Administered Medications Ordered in Other Visits  Medication Dose Route Frequency Provider Last Rate Last Dose  . tranexamic acid (CYKLOKAPRON) 2,000 mg in sodium chloride 0.9 % 50 mL Topical Application  7,026 mg Topical Once [employer], PA-C       No Known Allergies    Review of  Systems: Gen: Denies any fever, chills, sweats, anorexia, fatigue, weakness, malaise, weight loss, and sleep disorder CV: Denies chest pain, angina, palpitations, syncope, orthopnea, PND, peripheral edema, and claudication. Resp: Denies dyspnea at rest, dyspnea with exercise, cough, sputum, wheezing, coughing up blood, and pleurisy. GI: Denies vomiting blood, jaundice, and fecal incontinence.   Denies dysphagia or odynophagia. GU : Denies urinary burning, blood in urine, urinary frequency, urinary hesitancy, nocturnal urination, and urinary incontinence. MS: Denies joint pain, limitation of movement, and swelling, stiffness, low back pain, extremity pain. Denies muscle weakness, cramps, atrophy.  Derm: Denies rash, itching, dry skin, hives, moles, warts, or unhealing ulcers.  Psych: Denies depression, anxiety, memory loss, suicidal ideation, hallucinations, paranoia, and confusion. Heme: Denies bruising, bleeding, and enlarged lymph nodes. Neuro:  Denies any headaches, dizziness, paresthesia Endo:  Denies any problems with DM, thyroid, adrenal  LAB RESULTS: CBC 09/02/2014 white count 7.8, hemoglobin 12.3, hematocrit 36.9, platelets 317,000, MCV 79.2. CBC 08/03/2014 white count 8.1, hemoglobin 12.3, hematocrit 37.7, MCV 77.5, platelets 306,000.  08/02/2013 iron 39, ferritin 21.9.    Physical Exam: General: Pleasant, well  developed  African-American female in no acute distress Head: Normocephalic and atraumatic Eyes:  sclerae anicteric, conjunctiva pink  Ears: Normal auditory acuity Lungs: Clear throughout to auscultation Heart: Regular rate and rhythm Abdomen: Soft, non distended, non-tender. No masses, no hepatomegaly. Normal bowel sounds Rectal: Small external hemorrhoid Musculoskeletal: Symmetrical with no gross deformities  Extremities: No edema  Neurological: Alert oriented x 4, grossly nonfocal Psychological:  Alert and cooperative. Normal mood and affect  Assessment and Recommendations: #1. Iron deficiency anemia. Her blood counts have been decreasing over the past 3-4 years. She has been on iron supplementation. She is to be scheduled for colonoscopy to evaluate for polyps, neoplasia, AVMs, as well as an upper endoscopy to evaluate for esophagitis, gastritis, ulcers, AVMs etc.The risks, benefits, and alternatives to colonoscopy with possible biopsy and possible polypectomy were discussed with the patient and they consent to proceed. The risks, benefits, and alternatives to endoscopy with possible biopsy and possible dilation were discussed with the patient and they consent to proceed. The procedures will be scheduled with Dr. Ardis Hughs.  #2. Hemorrhoids. Patient has been instructed to use a stool softener as needed. She will use Tucks wipes. She will be given a trial of Analpram cream to apply rectally twice daily for 10 days.  Further recommendations will be made pending the findings of the above.        Aristotle Lieb, Vita Barley PA-C 10/19/2014,

## 2014-10-20 ENCOUNTER — Telehealth: Payer: Self-pay | Admitting: Physician Assistant

## 2014-10-20 NOTE — Progress Notes (Signed)
i agree with the above note, plan 

## 2014-10-21 NOTE — Telephone Encounter (Signed)
I called and the patient said she cannot afford the Analpram cream, it was $60.00.  And she could not afford the generic miralax for the prep.  I told her I will put a  Sample Suprep at the front desk with new instrucitons. She said her daughter will pick it up.  Her name is Wendy Torres.  She thanked me for helping her out.

## 2014-10-30 DIAGNOSIS — Z471 Aftercare following joint replacement surgery: Secondary | ICD-10-CM | POA: Diagnosis not present

## 2014-10-30 DIAGNOSIS — M25561 Pain in right knee: Secondary | ICD-10-CM | POA: Diagnosis not present

## 2014-10-30 DIAGNOSIS — Z96642 Presence of left artificial hip joint: Secondary | ICD-10-CM | POA: Diagnosis not present

## 2014-11-11 ENCOUNTER — Other Ambulatory Visit: Payer: Self-pay | Admitting: Internal Medicine

## 2014-11-11 NOTE — Telephone Encounter (Signed)
OK X1 

## 2014-11-12 ENCOUNTER — Other Ambulatory Visit: Payer: Self-pay | Admitting: Internal Medicine

## 2014-11-12 DIAGNOSIS — H20041 Secondary noninfectious iridocyclitis, right eye: Secondary | ICD-10-CM | POA: Diagnosis not present

## 2014-11-12 DIAGNOSIS — H04123 Dry eye syndrome of bilateral lacrimal glands: Secondary | ICD-10-CM | POA: Diagnosis not present

## 2014-12-02 DIAGNOSIS — M1711 Unilateral primary osteoarthritis, right knee: Secondary | ICD-10-CM | POA: Diagnosis not present

## 2014-12-06 ENCOUNTER — Other Ambulatory Visit: Payer: Self-pay | Admitting: Internal Medicine

## 2014-12-07 ENCOUNTER — Other Ambulatory Visit: Payer: Self-pay

## 2014-12-07 MED ORDER — OXYBUTYNIN CHLORIDE 5 MG PO TABS: 5.0000 mg | ORAL_TABLET | Freq: Three times a day (TID) | ORAL | Status: DC

## 2014-12-08 ENCOUNTER — Other Ambulatory Visit: Payer: Self-pay | Admitting: Internal Medicine

## 2014-12-08 ENCOUNTER — Encounter: Payer: Self-pay | Admitting: Gastroenterology

## 2014-12-08 ENCOUNTER — Ambulatory Visit (AMBULATORY_SURGERY_CENTER): Payer: Medicare Other | Admitting: Gastroenterology

## 2014-12-08 VITALS — BP 186/79 | HR 71 | Temp 97.8°F | Resp 19 | Ht 64.0 in | Wt 207.0 lb

## 2014-12-08 DIAGNOSIS — K2970 Gastritis, unspecified, without bleeding: Secondary | ICD-10-CM | POA: Diagnosis not present

## 2014-12-08 DIAGNOSIS — D509 Iron deficiency anemia, unspecified: Secondary | ICD-10-CM | POA: Diagnosis not present

## 2014-12-08 DIAGNOSIS — K625 Hemorrhage of anus and rectum: Secondary | ICD-10-CM

## 2014-12-08 DIAGNOSIS — D123 Benign neoplasm of transverse colon: Secondary | ICD-10-CM

## 2014-12-08 DIAGNOSIS — K2950 Unspecified chronic gastritis without bleeding: Secondary | ICD-10-CM | POA: Diagnosis not present

## 2014-12-08 MED ORDER — SODIUM CHLORIDE 0.9 % IV SOLN: 500.0000 mL | INTRAVENOUS | Status: DC

## 2014-12-08 NOTE — Progress Notes (Signed)
Called to room to assist during endoscopic procedure.  Patient ID and intended procedure confirmed with present staff. Received instructions for my participation in the procedure from the performing physician.  

## 2014-12-08 NOTE — Patient Instructions (Signed)
YOU HAD AN ENDOSCOPIC PROCEDURE TODAY AT THE [facility]:   Refer to the procedure report that was given to you for any specific questions about what was found during the examination.  If the procedure report does not answer your questions, please call your gastroenterologist to clarify.  If you requested that your care partner not be given the details of your procedure findings, then the procedure report has been included in a sealed envelope for you to review at your convenience later.  YOU SHOULD EXPECT: Some feelings of bloating in the abdomen. Passage of more gas than usual.  Walking can help get rid of the air that was put into your GI tract during the procedure and reduce the bloating. If you had a lower endoscopy (such as a colonoscopy or flexible sigmoidoscopy) you may notice spotting of blood in your stool or on the toilet paper. If you underwent a bowel prep for your procedure, you may not have a normal bowel movement for a few days.  Please Note:  You might notice some irritation and congestion in your nose or some drainage.  This is from the oxygen used during your procedure.  There is no need for concern and it should clear up in a day or so.  SYMPTOMS TO REPORT IMMEDIATELY:   Following lower endoscopy (colonoscopy or flexible sigmoidoscopy):  Excessive amounts of blood in the stool  Significant tenderness or worsening of abdominal pains  Swelling of the abdomen that is new, acute  Fever of 100F or higher   Following upper endoscopy (EGD)  Vomiting of blood or coffee ground material  New chest pain or pain under the shoulder blades  Painful or persistently difficult swallowing  New shortness of breath  Fever of 100F or higher  Black, tarry-looking stools  For urgent or emergent issues, a gastroenterologist can be reached at any hour by calling [phone].   DIET: Your first meal following the procedure should be a small meal and then it is ok to progress to  your normal diet. Heavy or fried foods are harder to digest and may make you feel nauseous or bloated.  Likewise, meals heavy in dairy and vegetables can increase bloating.  Drink plenty of fluids but you should avoid alcoholic beverages for 24 hours.  ACTIVITY:  You should plan to take it easy for the rest of today and you should NOT DRIVE or use heavy machinery until tomorrow (because of the sedation medicines used during the test).    FOLLOW UP: Our staff will call the number listed on your records the next business day following your procedure to check on you and address any questions or concerns that you may have regarding the information given to you following your procedure. If we do not reach you, we will leave a message.  However, if you are feeling well and you are not experiencing any problems, there is no need to return our call.  We will assume that you have returned to your regular daily activities without incident.  If any biopsies were taken you will be contacted by phone or by letter within the next 1-3 weeks.  Please call us at [phone] if you have not heard about the biopsies in 3 weeks.    SIGNATURES/CONFIDENTIALITY: You and/or your care partner have signed paperwork which will be entered into your electronic medical record.  These signatures attest to the fact that that the information above on your After Visit Summary has been reviewed   and is understood.  Full responsibility of the confidentiality of this discharge information lies with you and/or your care-partner. 

## 2014-12-08 NOTE — Telephone Encounter (Signed)
Done hardcopy to Dahlia  

## 2014-12-08 NOTE — Telephone Encounter (Signed)
Rx faxed to pharmacy  

## 2014-12-08 NOTE — Op Note (Signed)
[place]  [address] [place], [id number]   COLONOSCOPY PROCEDURE REPORT  PATIENT: Wendy Torres, Wendy Torres  MR#: [id number] BIRTHDATE: Sep 02, 1940 , 73  yrs. old GENDER: female ENDOSCOPIST: Milus Banister, MD PROCEDURE DATE:  12/08/2014 PROCEDURE:   Colonoscopy, diagnostic and Colonoscopy with snare polypectomy First Screening Colonoscopy - Avg.  risk and is 50 yrs.  old or older - No.  Prior Negative Screening - Now for repeat screening. N/A  History of Adenoma - Now for follow-up colonoscopy & has been > or = to 3 yrs.  N/A  Recommend repeat exam, <10 yrs? No ASA CLASS:   Class III INDICATIONS:Iron def anemia, minor rectal bleeding. MEDICATIONS: Monitored anesthesia care and Propofol 250 mg IV  DESCRIPTION OF PROCEDURE:   After the risks benefits and alternatives of the procedure were thoroughly explained, informed consent was obtained.  The digital rectal exam revealed no abnormalities of the rectum.   The LB [id number]  endoscope was introduced through the anus and advanced to the cecum, which was identified by both the appendix and ileocecal valve. No adverse events experienced.   The quality of the prep was excellent.  The instrument was then slowly withdrawn as the colon was fully examined. Estimated blood loss is zero unless otherwise noted in this procedure report.   COLON FINDINGS: One polyp was found, removed and sent to pathology. This was sessile, located in transverse segment, 58mm across, located in transverse segment, removed with cold snare.  There were small internal hemorrhoids.  The examination was otherwise normal. Retroflexed views revealed no abnormalities. The time to cecum = 1.8 Withdrawal time = 8.9   The scope was withdrawn and the procedure completed. COMPLICATIONS: There were no immediate complications.  ENDOSCOPIC IMPRESSION: One polyp was found, removed and sent to pathology.  This was sessile, located in transverse segment,  63mm across, located in transverse segment, removed with cold snare.  There were small internal hemorrhoids.  The examination was otherwise normal  RECOMMENDATIONS: Await final pathology.  Will proceed with EGD now.  eSigned:  Milus Banister, MD 12/08/2014 1:48 PM

## 2014-12-08 NOTE — Op Note (Signed)
[place]  [address] [place], [id number]   ENDOSCOPY PROCEDURE REPORT  PATIENT: Wendy, Torres  MR#: [id number] BIRTHDATE: June 22, 1940 , 73  yrs. old GENDER: female ENDOSCOPIST: Milus Banister, MD PROCEDURE DATE:  12/08/2014 PROCEDURE:  EGD w/ biopsy ASA CLASS:     Class III INDICATIONS:  iron def anemia. MEDICATIONS: Monitored anesthesia care and Propofol 100 mg IV TOPICAL ANESTHETIC: none  DESCRIPTION OF PROCEDURE: After the risks benefits and alternatives of the procedure were thoroughly explained, informed consent was obtained.  The LB [id number] endoscope was introduced through the mouth and advanced to the second portion of the duodenum , Without limitations.  The instrument was slowly withdrawn as the mucosa was fully examined.  There was mild, non-specific distal gastritis.  This was biopsied and sent to pathology.  The examination was otherwise normal. Retroflexed views revealed no abnormalities.     The scope was then withdrawn from the patient and the procedure completed. COMPLICATIONS: There were no immediate complications. ENDOSCOPIC IMPRESSION: There was mild, non-specific distal gastritis.  This was biopsied and sent to pathology.  The examination was otherwise normal  RECOMMENDATIONS: Await final pathology.  For now continue once daily iron supplement.    eSigned:  Milus Banister, MD 12/08/2014 1:51 PM

## 2014-12-08 NOTE — Progress Notes (Signed)
Transferred to recovery room. A/O x3, pleased with MAC.  VSS.  Report to Karen, RN. 

## 2014-12-09 ENCOUNTER — Other Ambulatory Visit: Payer: Self-pay | Admitting: Internal Medicine

## 2014-12-09 ENCOUNTER — Telehealth: Payer: Self-pay | Admitting: *Deleted

## 2014-12-09 NOTE — Telephone Encounter (Signed)
  Follow up Call-  Call back number 12/08/2014  Post procedure Call Back phone  # [phone]  Permission to leave phone message Yes     Patient questions:  Do you have a fever, pain , or abdominal swelling? No. Pain Score  0 *  Have you tolerated food without any problems? Yes.    Have you been able to return to your normal activities? Yes.    Do you have any questions about your discharge instructions: Diet   No. Medications  No. Follow up visit  No.  Do you have questions or concerns about your Care? No.  Actions: * If pain score is 4 or above: No action needed, pain <4.

## 2014-12-10 DIAGNOSIS — M1711 Unilateral primary osteoarthritis, right knee: Secondary | ICD-10-CM | POA: Diagnosis not present

## 2014-12-15 ENCOUNTER — Other Ambulatory Visit: Payer: Self-pay

## 2014-12-15 ENCOUNTER — Encounter: Payer: Self-pay | Admitting: Gastroenterology

## 2014-12-15 DIAGNOSIS — D649 Anemia, unspecified: Secondary | ICD-10-CM

## 2014-12-16 DIAGNOSIS — H04123 Dry eye syndrome of bilateral lacrimal glands: Secondary | ICD-10-CM | POA: Diagnosis not present

## 2014-12-16 DIAGNOSIS — H20041 Secondary noninfectious iridocyclitis, right eye: Secondary | ICD-10-CM | POA: Diagnosis not present

## 2014-12-17 ENCOUNTER — Other Ambulatory Visit: Payer: Self-pay

## 2014-12-17 MED ORDER — PANTOPRAZOLE SODIUM 40 MG PO TBEC: 40.0000 mg | DELAYED_RELEASE_TABLET | Freq: Every day | ORAL | Status: DC

## 2014-12-17 MED ORDER — FUROSEMIDE 20 MG PO TABS: 20.0000 mg | ORAL_TABLET | ORAL | Status: DC

## 2014-12-17 MED ORDER — LOVASTATIN 40 MG PO TABS: 40.0000 mg | ORAL_TABLET | Freq: Every day | ORAL | Status: DC

## 2014-12-17 MED ORDER — OXYBUTYNIN CHLORIDE 5 MG PO TABS: 5.0000 mg | ORAL_TABLET | Freq: Three times a day (TID) | ORAL | Status: DC

## 2014-12-17 MED ORDER — BENAZEPRIL HCL 40 MG PO TABS: 40.0000 mg | ORAL_TABLET | Freq: Every day | ORAL | Status: DC

## 2014-12-17 MED ORDER — AMLODIPINE BESYLATE 10 MG PO TABS: 10.0000 mg | ORAL_TABLET | ORAL | Status: DC

## 2014-12-17 MED ORDER — CLONIDINE HCL 0.3 MG PO TABS: 0.3000 mg | ORAL_TABLET | Freq: Two times a day (BID) | ORAL | Status: DC

## 2014-12-18 DIAGNOSIS — M1711 Unilateral primary osteoarthritis, right knee: Secondary | ICD-10-CM | POA: Diagnosis not present

## 2014-12-22 ENCOUNTER — Other Ambulatory Visit: Payer: Self-pay

## 2014-12-22 MED ORDER — IRBESARTAN 300 MG PO TABS: 300.0000 mg | ORAL_TABLET | Freq: Every day | ORAL | Status: DC

## 2014-12-22 NOTE — Telephone Encounter (Signed)
Xanax too soon, last rx already done Dec 08, 2014

## 2015-01-04 ENCOUNTER — Other Ambulatory Visit: Payer: Self-pay | Admitting: Internal Medicine

## 2015-01-06 NOTE — Telephone Encounter (Signed)
Rx faxed to pharmacy  

## 2015-01-06 NOTE — Telephone Encounter (Signed)
Done hardcopy to Dahlia  

## 2015-01-11 DIAGNOSIS — Z471 Aftercare following joint replacement surgery: Secondary | ICD-10-CM | POA: Diagnosis not present

## 2015-01-11 DIAGNOSIS — M545 Low back pain: Secondary | ICD-10-CM | POA: Diagnosis not present

## 2015-01-11 DIAGNOSIS — Z96642 Presence of left artificial hip joint: Secondary | ICD-10-CM | POA: Diagnosis not present

## 2015-01-20 ENCOUNTER — Other Ambulatory Visit: Payer: Self-pay | Admitting: Internal Medicine

## 2015-03-12 ENCOUNTER — Other Ambulatory Visit: Payer: Self-pay

## 2015-03-12 MED ORDER — ALPRAZOLAM 0.25 MG PO TABS: 0.2500 mg | ORAL_TABLET | Freq: Two times a day (BID) | ORAL | Status: DC | PRN

## 2015-03-12 MED ORDER — GABAPENTIN 300 MG PO CAPS: 300.0000 mg | ORAL_CAPSULE | Freq: Three times a day (TID) | ORAL | Status: DC

## 2015-03-12 NOTE — Telephone Encounter (Signed)
Rx faxed to pharmacy  

## 2015-03-12 NOTE — Telephone Encounter (Signed)
Done hardcopy to Dahlia  

## 2015-03-31 ENCOUNTER — Other Ambulatory Visit: Payer: Self-pay | Admitting: Internal Medicine

## 2015-04-30 ENCOUNTER — Ambulatory Visit: Payer: Medicare Other | Admitting: Internal Medicine

## 2015-05-04 ENCOUNTER — Ambulatory Visit: Payer: Medicare Other | Admitting: Internal Medicine

## 2015-05-04 ENCOUNTER — Ambulatory Visit (INDEPENDENT_AMBULATORY_CARE_PROVIDER_SITE_OTHER): Payer: Medicare Other | Admitting: Internal Medicine

## 2015-05-04 ENCOUNTER — Other Ambulatory Visit (INDEPENDENT_AMBULATORY_CARE_PROVIDER_SITE_OTHER): Payer: Medicare Other

## 2015-05-04 ENCOUNTER — Encounter: Payer: Self-pay | Admitting: Internal Medicine

## 2015-05-04 VITALS — BP 210/92 | HR 75 | Temp 97.9°F | Ht 64.0 in | Wt 209.0 lb

## 2015-05-04 DIAGNOSIS — Z0000 Encounter for general adult medical examination without abnormal findings: Secondary | ICD-10-CM

## 2015-05-04 DIAGNOSIS — R011 Cardiac murmur, unspecified: Secondary | ICD-10-CM

## 2015-05-04 DIAGNOSIS — E785 Hyperlipidemia, unspecified: Secondary | ICD-10-CM

## 2015-05-04 DIAGNOSIS — K146 Glossodynia: Secondary | ICD-10-CM | POA: Diagnosis not present

## 2015-05-04 DIAGNOSIS — H9311 Tinnitus, right ear: Secondary | ICD-10-CM

## 2015-05-04 DIAGNOSIS — D649 Anemia, unspecified: Secondary | ICD-10-CM

## 2015-05-04 DIAGNOSIS — I10 Essential (primary) hypertension: Secondary | ICD-10-CM

## 2015-05-04 DIAGNOSIS — G894 Chronic pain syndrome: Secondary | ICD-10-CM

## 2015-05-04 LAB — BASIC METABOLIC PANEL
BUN: 13 mg/dL (ref 6–23)
CALCIUM: 10.8 mg/dL — AB (ref 8.4–10.5)
CO2: 30 mEq/L (ref 19–32)
CREATININE: 1 mg/dL (ref 0.40–1.20)
Chloride: 102 mEq/L (ref 96–112)
GFR: 69.28 mL/min (ref >60.00)
Glucose, Bld: 96 mg/dL (ref 70–99)
Potassium: 3.9 mEq/L (ref 3.5–5.1)
Sodium: 139 mEq/L (ref 135–145)

## 2015-05-04 LAB — LIPID PANEL
CHOL/HDL RATIO: 3
Cholesterol: 162 mg/dL (ref 0–200)
HDL: 51.7 mg/dL (ref >39.00)
LDL CALC: 93 mg/dL (ref 0–99)
NONHDL: 110.69
TRIGLYCERIDES: 90 mg/dL (ref 0.0–149.0)
VLDL: 18 mg/dL (ref 0.0–40.0)

## 2015-05-04 LAB — URINALYSIS, ROUTINE W REFLEX MICROSCOPIC
BILIRUBIN URINE: NEGATIVE
Hgb urine dipstick: NEGATIVE
Ketones, ur: NEGATIVE
Nitrite: NEGATIVE
PH: 7 (ref 5.0–8.0)
Specific Gravity, Urine: 1.01 (ref 1.000–1.030)
TOTAL PROTEIN, URINE-UPE24: NEGATIVE
UROBILINOGEN UA: 0.2 (ref 0.0–1.0)
Urine Glucose: NEGATIVE

## 2015-05-04 LAB — HEPATIC FUNCTION PANEL
ALT: 11 U/L (ref 0–35)
AST: 16 U/L (ref 0–37)
Albumin: 4.4 g/dL (ref 3.5–5.2)
Alkaline Phosphatase: 128 U/L — ABNORMAL HIGH (ref 39–117)
BILIRUBIN DIRECT: 0.1 mg/dL (ref 0.0–0.3)
BILIRUBIN TOTAL: 0.3 mg/dL (ref 0.2–1.2)
Total Protein: 8.7 g/dL — ABNORMAL HIGH (ref 6.0–8.3)

## 2015-05-04 LAB — CBC WITH DIFFERENTIAL/PLATELET
BASOS ABS: 0 10*3/uL (ref 0.0–0.1)
Basophils Relative: 0.6 % (ref 0.0–3.0)
EOS ABS: 0.4 10*3/uL (ref 0.0–0.7)
Eosinophils Relative: 4.7 % (ref 0.0–5.0)
HEMATOCRIT: 40.7 % (ref 36.0–46.0)
HEMOGLOBIN: 13 g/dL (ref 12.0–15.0)
LYMPHS PCT: 34.7 % (ref 12.0–46.0)
Lymphs Abs: 2.6 10*3/uL (ref 0.7–4.0)
MCHC: 32 g/dL (ref 30.0–36.0)
MCV: 84.4 fl (ref 78.0–100.0)
MONOS PCT: 11.8 % (ref 3.0–12.0)
Monocytes Absolute: 0.9 10*3/uL (ref 0.1–1.0)
NEUTROS ABS: 3.6 10*3/uL (ref 1.4–7.7)
Neutrophils Relative %: 48.2 % (ref 43.0–77.0)
Platelets: 299 10*3/uL (ref 150.0–400.0)
RBC: 4.82 Mil/uL (ref 3.87–5.11)
RDW: 14.8 % (ref 11.5–15.5)
WBC: 7.6 10*3/uL (ref 4.0–10.5)

## 2015-05-04 LAB — TSH: TSH: 0.77 u[IU]/mL (ref 0.35–4.50)

## 2015-05-04 MED ORDER — HYDRALAZINE HCL 25 MG PO TABS: 25.0000 mg | ORAL_TABLET | Freq: Three times a day (TID) | ORAL | Status: DC

## 2015-05-04 MED ORDER — TRAMADOL HCL 50 MG PO TABS: ORAL_TABLET | ORAL | Status: DC

## 2015-05-04 NOTE — Progress Notes (Signed)
Subjective:    Patient ID: Wendy Torres, female    DOB: 1940/09/21, 75 y.o.   MRN: [id number]  HPI  Here for wellness and f/u;  Overall doing ok;  Pt denies Chest pain, worsening SOB, DOE, wheezing, orthopnea, PND, worsening LE edema, palpitations, dizziness or syncope.  Pt denies neurological change such as new headache, facial or extremity weakness.  Pt denies polydipsia, polyuria, or low sugar symptoms. Pt states overall good compliance with treatment and medications, good tolerability, and has been trying to follow appropriate diet.  Pt denies worsening depressive symptoms, suicidal ideation or panic. No fever, night sweats, wt loss, loss of appetite, or other constitutional symptoms.  Pt states good ability with ADL's, has low fall risk, home safety reviewed and adequate, no other significant changes in hearing or vision, and only occasionally active with exercise.  Does have right sided occas nonpulsatile roaring/tinnitus for several months.  BP has been elevated recently more 160-170 range at home. Tongue pain persists as well, osme fissuring noted and not improved with magic mouthwash Past Medical History  Diagnosis Date  . HYPERLIPIDEMIA 04/18/2007  . Morbid obesity ([place]) 12/09/2006  . ANEMIA 01/14/2009  . Chronic pain syndrome 02/16/2009  . HYPERTENSION 12/09/2006  . ACUTE URIS OF UNSPECIFIED SITE 10/22/2007  . ALLERGIC RHINITIS 12/09/2006  . ASTHMA 12/09/2006  . COPD 12/09/2006  . Glossitis 01/14/2009  . GERD 12/09/2006  . HIATAL HERNIA 03/01/2010  . OVERACTIVE BLADDER 04/14/2008  . DEGENERATIVE JOINT DISEASE 12/09/2006  . LOW BACK PAIN 12/09/2006  . OSTEOPOROSIS 12/09/2006  . SYNCOPE 11/05/2008  . DIZZINESS 03/30/2010  . INSOMNIA-SLEEP DISORDER-UNSPEC 11/05/2008  . HYPERSOMNIA 08/31/2008  . PALPITATIONS, RECURRENT 01/27/2010  . FREQUENCY, URINARY 03/30/2010  . Nonspecific (abnormal) findings on radiological and other examination of body structure 11/05/2008  . TRANSIENT ISCHEMIC ATTACK, HX OF  12/09/2006  . HELICOBACTER PYLORI GASTRITIS, HX OF 12/09/2006  . ABNORMAL CHEST XRAY 11/05/2008  . DYSPNEA 01/27/2010    with heavy exertion  . Arthritis   . Anxiety   . Blood transfusion without reported diagnosis   . Cataract     bilateral -removed  . Stroke [facility])     pt. stated light one years ago   Past Surgical History  Procedure Laterality Date  . Tubal ligation    . Lumbar disc surgery    . Colonoscopy    . Tonsillectomy      as a child  . Total hip arthroplasty Right 11/06/2012    Procedure: RIGHT TOTAL HIP ARTHROPLASTY WITH ACETABULAR AUTOGRAFT;  Surgeon: Gearlean Alf, MD;  Location: [facility];  Service: Orthopedics;  Laterality: Right;  . Total hip arthroplasty Left 02/18/2014    Procedure: LEFT TOTAL HIP ARTHROPLASTY;  Surgeon: Gearlean Alf, MD;  Location: [facility];  Service: Orthopedics;  Laterality: Left;    reports that she quit smoking about 42 years ago. Her smoking use included Cigarettes. She has a 12.5 pack-year smoking history. She has never used smokeless tobacco. She reports that she does not drink alcohol or use illicit drugs. family history includes Heart disease in her mother; Hypertension in her brother; Lung cancer in her father. There is no history of Diabetes, Colon cancer, Colon polyps, Kidney disease, Gallbladder disease, or Esophageal cancer. No Known Allergies Current Outpatient Prescriptions on File Prior to Visit  Medication Sig Dispense Refill  . ALPRAZolam (XANAX) 0.25 MG tablet Take 1 tablet (0.25 mg total) by mouth 2 (two) times daily as needed. 60 tablet 2  .  amLODipine (NORVASC) 10 MG tablet Take 1 tablet (10 mg total) by mouth every morning. 90 tablet 1  . cloNIDine (CATAPRES) 0.3 MG tablet Take 1 tablet (0.3 mg total) by mouth 2 (two) times daily. 180 tablet 1  . docusate sodium 100 MG CAPS Take 100 mg by mouth 2 (two) times daily. 10 capsule 0  . furosemide (LASIX) 20 MG tablet Take 1 tablet by mouth  every morning 90 tablet 1  . gabapentin  (NEURONTIN) 300 MG capsule TAKE 1 CAPSULE (300 MG TOTAL) BY MOUTH 3 (THREE) TIMES DAILY. 90 capsule 6  . irbesartan (AVAPRO) 300 MG tablet Take 1 tablet (300 mg total) by mouth daily. 90 tablet 3  . lovastatin (MEVACOR) 40 MG tablet Take 1 tablet by mouth at  bedtime 90 tablet 1  . oxybutynin (DITROPAN) 5 MG tablet Take 1 tablet (5 mg total) by mouth 3 (three) times daily. 270 tablet 1  . pantoprazole (PROTONIX) 40 MG tablet Take 1 tablet (40 mg total) by mouth daily. 90 tablet 1  . Polyethyl Glycol-Propyl Glycol (SYSTANE OP) Place 1 drop into both eyes. Three to four times daily.    . polyethylene glycol (MIRALAX / GLYCOLAX) packet Take 17 g by mouth daily as needed for mild constipation. 14 each 0  . traZODone (DESYREL) 50 MG tablet Take 25-50 mg by mouth at bedtime as needed for sleep.     . traZODone (DESYREL) 50 MG tablet TAKE 0.5-1 TABLETS (25-50 MG TOTAL) BY MOUTH AT BEDTIME AS NEEDED FOR SLEEP. 60 tablet 5  . acetaminophen (TYLENOL) 325 MG tablet Take 2 tablets (650 mg total) by mouth every 6 (six) hours as needed for mild pain (or Fever >/= 101). (Patient not taking: Reported on 05/04/2015) 40 tablet 0  . Alum & Mag Hydroxide-Simeth (MAGIC MOUTHWASH) SOLN Take 5 mLs by mouth 4 (four) times daily as needed for mouth pain. (Patient not taking: Reported on 12/08/2014) 60 mL 5  . bisacodyl (DULCOLAX) 10 MG suppository Place 1 suppository (10 mg total) rectally daily as needed for moderate constipation. (Patient not taking: Reported on 05/04/2015) 12 suppository 0  . hydrocortisone-pramoxine (ANALPRAM HC) 2.5-1 % rectal cream Apply rectally twice daily. (Patient not taking: Reported on 12/08/2014) 30 g 1  . methocarbamol (ROBAXIN) 500 MG tablet Take 1 tablet (500 mg total) by mouth every 6 (six) hours as needed for muscle spasms. (Patient not taking: Reported on 12/08/2014) 80 tablet 0  . ondansetron (ZOFRAN) 4 MG tablet Take 1 tablet (4 mg total) by mouth every 6 (six) hours as needed for nausea. (Patient  not taking: Reported on 05/04/2015) 20 tablet 0  . [DISCONTINUED] lansoprazole (PREVACID) 30 MG capsule Take 1 capsule (30 mg total) by mouth daily. 90 capsule 3   Current Facility-Administered Medications on File Prior to Visit  Medication Dose Route Frequency Provider Last Rate Last Dose  . tranexamic acid (CYKLOKAPRON) 2,000 mg in sodium chloride 0.9 % 50 mL Topical Application  123XX123 mg Topical Once [employer], PA-C         Review of Systems Constitutional: Negative for increased diaphoresis, other activity, appetite or siginficant weight change other than noted HENT: Negative for worsening hearing loss, ear pain, facial swelling, mouth sores and neck stiffness.   Eyes: Negative for other worsening pain, redness or visual disturbance.  Respiratory: Negative for shortness of breath and wheezing  Cardiovascular: Negative for chest pain and palpitations.  Gastrointestinal: Negative for diarrhea, blood in stool, abdominal distention or other pain Genitourinary: Negative  for hematuria, flank pain or change in urine volume.  Musculoskeletal: Negative for myalgias or other joint complaints.  Skin: Negative for color change and wound or drainage.  Neurological: Negative for syncope and numbness. other than noted Hematological: Negative for adenopathy. or other swelling Psychiatric/Behavioral: Negative for hallucinations, SI, self-injury, decreased concentration or other worsening agitation.      Objective:   Physical Exam BP 210/92 mmHg  Pulse 75  Temp(Src) 97.9 F (36.6 C) (Oral)  Ht 5\' 4"  (1.626 m)  Wt 209 lb (94.802 kg)  BMI 35.86 kg/m2  SpO2 94% VS noted, not ill appearing, obese Constitutional: Pt is oriented to person, place, and time. Appears well-developed and well-nourished, in no significant distress Head: Normocephalic and atraumatic.  Right Ear: External ear normal.  Left Ear: External ear normal.  Nose: Nose normal.  Mouth/Throat: Oropharynx is clear and moist. +  tongue dorsal fissuring Eyes: Conjunctivae and EOM are normal. Pupils are equal, round, and reactive to light.  Neck: Normal range of motion. Neck supple. No JVD present. No tracheal deviation present or significant neck LA or mass Cardiovascular: Normal rate, regular rhythm, normal heart sounds and intact distal pulses.  except Gr 1-2 sys murmur RUSB Pulmonary/Chest: Effort normal and breath sounds without rales or wheezing  Abdominal: Soft. Bowel sounds are normal. NT. No HSM  Musculoskeletal: Normal range of motion. Exhibits no edema.  Lymphadenopathy:  Has no cervical adenopathy.  Neurological: Pt is alert and oriented to person, place, and time. Pt has normal reflexes. No cranial nerve deficit. Motor grossly intact Skin: Skin is warm and dry. No rash noted.  Psychiatric:  Has normal mood and affect. Behavior is normal.     Assessment & Plan:

## 2015-05-04 NOTE — Progress Notes (Signed)
Pre visit review using our clinic review tool, if applicable. No additional management support is needed unless otherwise documented below in the visit note. 

## 2015-05-04 NOTE — Assessment & Plan Note (Signed)
Stable, for med refills - tramadol prn,  to f/u any worsening symptoms or concerns

## 2015-05-04 NOTE — Patient Instructions (Addendum)
OK to stop the benazepril (lotensin) if you are taking this (because you are taking the generic avapro irbesartan instead)  Please take all new medication as prescribed - the hydralazine at 25 mg three times per day, as well as the nystatin solution for the tongue  A new prescription for the BP medicine went to the [facility], the [facility]ystatin went to [facility]  Please call in 7-10 days if the Blood Pressure is still more than 140/90 usually  Please continue all other medications as before, and refills have been done if requested.  Please have the pharmacy call with any other refills you may need.  Please continue your efforts at being more active, low cholesterol diet, and weight control.  You are otherwise up to date with prevention measures today.  Please keep your appointments with your specialists as you may have planned  You will be contacted regarding the referral for: echocardiogram   Please go to the LAB in the Basement (turn left off the elevator) for the tests to be done today  You will be contacted by phone if any changes need to be made immediately.  Otherwise, you will receive a letter about your results with an explanation, but please check with MyChart first.  Please remember to sign up for MyChart if you have not done so, as this will be important to you in the future with finding out test results, communicating by private email, and scheduling acute appointments online when needed.  Please return in 3 months

## 2015-05-05 ENCOUNTER — Encounter: Payer: Self-pay | Admitting: Internal Medicine

## 2015-05-05 ENCOUNTER — Other Ambulatory Visit (INDEPENDENT_AMBULATORY_CARE_PROVIDER_SITE_OTHER): Payer: Medicare Other

## 2015-05-05 ENCOUNTER — Other Ambulatory Visit: Payer: Self-pay | Admitting: Internal Medicine

## 2015-05-05 ENCOUNTER — Telehealth: Payer: Self-pay

## 2015-05-05 DIAGNOSIS — H9311 Tinnitus, right ear: Secondary | ICD-10-CM | POA: Insufficient documentation

## 2015-05-05 DIAGNOSIS — H9319 Tinnitus, unspecified ear: Secondary | ICD-10-CM | POA: Insufficient documentation

## 2015-05-05 LAB — VITAMIN D 25 HYDROXY (VIT D DEFICIENCY, FRACTURES): VITD: 43.74 ng/mL (ref 30.00–100.00)

## 2015-05-05 NOTE — Assessment & Plan Note (Signed)

## 2015-05-05 NOTE — Telephone Encounter (Signed)
Per [facility], PTH and Vit D cannot be added on pt's labs drawn 05/04/2015. Please add orders for PTH and Vit D and I will call pt to come back for redraw

## 2015-05-05 NOTE — Assessment & Plan Note (Signed)
Etiology unclear, no specific tx at this time, is nonpulsatile, consder ENT eval,  to f/u any worsening symptoms or concerns

## 2015-05-05 NOTE — Assessment & Plan Note (Signed)
Not well controlled today o/w stable overall by history and exam, recent data reviewed with pt, will avoid BB for now given already taking clonidine with lower HR risk, to add hydralazine 25 tid, to consider increase to 50 tid in 1 wk if not significantly improved,,  to f/u any worsening symptoms or concerns BP Readings from Last 3 Encounters:  05/04/15 210/92  12/08/14 186/79  10/19/14 130/70

## 2015-05-05 NOTE — Assessment & Plan Note (Signed)
C/w glossitis, for nystatin soln prn trial,  to f/u any worsening symptoms or concerns

## 2015-05-05 NOTE — Assessment & Plan Note (Signed)
?   New onset, asympt, for echo r/o AS

## 2015-05-06 ENCOUNTER — Telehealth: Payer: Self-pay | Admitting: *Deleted

## 2015-05-06 ENCOUNTER — Other Ambulatory Visit: Payer: Medicare Other

## 2015-05-06 MED ORDER — NYSTATIN 100000 UNIT/ML MT SUSP: 5.0000 mL | Freq: Four times a day (QID) | OROMUCOSAL | Status: DC | PRN

## 2015-05-06 NOTE — Telephone Encounter (Signed)
Receive call pt daughter stated md was suppose to send in something for the yeast on my tongue, but [facility] never receive. Per ov notes from Tues md noted will send Nystatin solution inform Olin Hauser will [facility] to [facility].../lmb

## 2015-05-07 ENCOUNTER — Encounter: Payer: Self-pay | Admitting: Internal Medicine

## 2015-05-07 LAB — PTH, INTACT AND CALCIUM
Calcium: 10.5 mg/dL (ref 8.4–10.5)
PTH: 76 pg/mL — AB (ref 14–64)

## 2015-05-12 ENCOUNTER — Other Ambulatory Visit: Payer: Self-pay

## 2015-05-12 DIAGNOSIS — D649 Anemia, unspecified: Secondary | ICD-10-CM

## 2015-05-19 ENCOUNTER — Telehealth: Payer: Self-pay | Admitting: Internal Medicine

## 2015-05-19 NOTE — Telephone Encounter (Signed)
Needs clarification on script  

## 2015-06-02 ENCOUNTER — Other Ambulatory Visit: Payer: Self-pay

## 2015-06-02 ENCOUNTER — Ambulatory Visit (HOSPITAL_COMMUNITY): Payer: Medicare Other | Attending: Cardiovascular Disease

## 2015-06-02 ENCOUNTER — Telehealth: Payer: Self-pay | Admitting: Internal Medicine

## 2015-06-02 ENCOUNTER — Encounter: Payer: Self-pay | Admitting: Internal Medicine

## 2015-06-02 DIAGNOSIS — R011 Cardiac murmur, unspecified: Secondary | ICD-10-CM | POA: Insufficient documentation

## 2015-06-02 DIAGNOSIS — I517 Cardiomegaly: Secondary | ICD-10-CM | POA: Insufficient documentation

## 2015-06-02 DIAGNOSIS — I351 Nonrheumatic aortic (valve) insufficiency: Secondary | ICD-10-CM | POA: Insufficient documentation

## 2015-06-02 MED ORDER — HYDRALAZINE HCL 50 MG PO TABS: 50.0000 mg | ORAL_TABLET | Freq: Three times a day (TID) | ORAL | Status: DC

## 2015-06-02 NOTE — Telephone Encounter (Signed)
Pt called stated she saw Dr. Jenny Reichmann on 05/04/15 and he gave her BP medication to bring it down. Pt went for the echo today and her BP was 178/124, complain of headache. Please advise on what to do, she take med as directed.

## 2015-06-02 NOTE — Telephone Encounter (Signed)
Ok to increase the hydralazine from 25 tid, to 50 tid - done erx  Cont to monitor BP  ROV 1-2 weeks

## 2015-06-02 NOTE — Telephone Encounter (Signed)
Please advise 

## 2015-06-03 ENCOUNTER — Other Ambulatory Visit: Payer: Self-pay | Admitting: Internal Medicine

## 2015-06-16 ENCOUNTER — Ambulatory Visit: Payer: Self-pay | Admitting: Internal Medicine

## 2015-06-19 ENCOUNTER — Other Ambulatory Visit: Payer: Self-pay | Admitting: Internal Medicine

## 2015-06-22 ENCOUNTER — Other Ambulatory Visit: Payer: Self-pay | Admitting: Internal Medicine

## 2015-06-23 ENCOUNTER — Ambulatory Visit (INDEPENDENT_AMBULATORY_CARE_PROVIDER_SITE_OTHER): Payer: Medicare Other | Admitting: Internal Medicine

## 2015-06-23 ENCOUNTER — Encounter: Payer: Self-pay | Admitting: Internal Medicine

## 2015-06-23 VITALS — BP 160/85 | HR 92 | Temp 97.8°F | Resp 20 | Wt 203.0 lb

## 2015-06-23 DIAGNOSIS — J438 Other emphysema: Secondary | ICD-10-CM

## 2015-06-23 DIAGNOSIS — E785 Hyperlipidemia, unspecified: Secondary | ICD-10-CM

## 2015-06-23 DIAGNOSIS — I10 Essential (primary) hypertension: Secondary | ICD-10-CM

## 2015-06-23 NOTE — Assessment & Plan Note (Signed)
BP improved but still mild elevated, ok to re-start clonidine and cont to monitor at home, f/u BP at home and next visit BP Readings from Last 3 Encounters:  06/23/15 160/85  05/04/15 210/92  12/08/14 186/79

## 2015-06-23 NOTE — Progress Notes (Signed)
Subjective:    Patient ID: Wendy Torres, female    DOB: 12-21-1940, 75 y.o.   MRN: [id number]  HPI  Here to f/u; overall doing ok,  Pt denies chest pain, increasing sob or doe, wheezing, orthopnea, PND, increased LE swelling, palpitations, dizziness or syncope.  Pt denies new neurological symptoms such as new headache, or facial or extremity weakness or numbness.  Pt denies polydipsia, polyuria, or low sugar episode.   Pt denies new neurological symptoms such as new facial or extremity weakness or numbness.   Pt states overall good compliance with meds, mostly trying to follow appropriate diet, with wt overall stable,  but little exercise however. Bp at home one wk ago with SBP of 150.  Has been out of clonidine due to not getting in the mail , but will likely get today. Past Medical History  Diagnosis Date  . HYPERLIPIDEMIA 04/18/2007  . Morbid obesity ([place]) 12/09/2006  . ANEMIA 01/14/2009  . Chronic pain syndrome 02/16/2009  . HYPERTENSION 12/09/2006  . ACUTE URIS OF UNSPECIFIED SITE 10/22/2007  . ALLERGIC RHINITIS 12/09/2006  . ASTHMA 12/09/2006  . COPD 12/09/2006  . Glossitis 01/14/2009  . GERD 12/09/2006  . HIATAL HERNIA 03/01/2010  . OVERACTIVE BLADDER 04/14/2008  . DEGENERATIVE JOINT DISEASE 12/09/2006  . LOW BACK PAIN 12/09/2006  . OSTEOPOROSIS 12/09/2006  . SYNCOPE 11/05/2008  . DIZZINESS 03/30/2010  . INSOMNIA-SLEEP DISORDER-UNSPEC 11/05/2008  . HYPERSOMNIA 08/31/2008  . PALPITATIONS, RECURRENT 01/27/2010  . FREQUENCY, URINARY 03/30/2010  . Nonspecific (abnormal) findings on radiological and other examination of body structure 11/05/2008  . TRANSIENT ISCHEMIC ATTACK, HX OF 12/09/2006  . HELICOBACTER PYLORI GASTRITIS, HX OF 12/09/2006  . ABNORMAL CHEST XRAY 11/05/2008  . DYSPNEA 01/27/2010    with heavy exertion  . Arthritis   . Anxiety   . Blood transfusion without reported diagnosis   . Cataract     bilateral -removed  . Stroke [facility])     pt. stated light one years ago   Past Surgical  History  Procedure Laterality Date  . Tubal ligation    . Lumbar disc surgery    . Colonoscopy    . Tonsillectomy      as a child  . Total hip arthroplasty Right 11/06/2012    Procedure: RIGHT TOTAL HIP ARTHROPLASTY WITH ACETABULAR AUTOGRAFT;  Surgeon: Gearlean Alf, MD;  Location: [facility];  Service: Orthopedics;  Laterality: Right;  . Total hip arthroplasty Left 02/18/2014    Procedure: LEFT TOTAL HIP ARTHROPLASTY;  Surgeon: Gearlean Alf, MD;  Location: [facility];  Service: Orthopedics;  Laterality: Left;    reports that she quit smoking about 42 years ago. Her smoking use included Cigarettes. She has a 12.5 pack-year smoking history. She has never used smokeless tobacco. She reports that she does not drink alcohol or use illicit drugs. family history includes Heart disease in her mother; Hypertension in her brother; Lung cancer in her father. There is no history of Diabetes, Colon cancer, Colon polyps, Kidney disease, Gallbladder disease, or Esophageal cancer. No Known Allergies' Current Outpatient Prescriptions on File Prior to Visit  Medication Sig Dispense Refill  . acetaminophen (TYLENOL) 325 MG tablet Take 2 tablets (650 mg total) by mouth every 6 (six) hours as needed for mild pain (or Fever >/= 101). 40 tablet 0  . ALPRAZolam (XANAX) 0.25 MG tablet Take 1 tablet (0.25 mg total) by mouth 2 (two) times daily as needed. 60 tablet 2  . Alum & Mag Hydroxide-Simeth (MAGIC  MOUTHWASH) SOLN Take 5 mLs by mouth 4 (four) times daily as needed for mouth pain. 60 mL 5  . amLODipine (NORVASC) 10 MG tablet Take 1 tablet by mouth  every morning 90 tablet 2  . bisacodyl (DULCOLAX) 10 MG suppository Place 1 suppository (10 mg total) rectally daily as needed for moderate constipation. 12 suppository 0  . cloNIDine (CATAPRES) 0.3 MG tablet Take 1 tablet by mouth two  times daily 180 tablet 2  . docusate sodium 100 MG CAPS Take 100 mg by mouth 2 (two) times daily. 10 capsule 0  . furosemide (LASIX) 20 MG  tablet Take 1 tablet by mouth  every morning 90 tablet 1  . gabapentin (NEURONTIN) 300 MG capsule TAKE 1 CAPSULE (300 MG TOTAL) BY MOUTH 3 (THREE) TIMES DAILY. 90 capsule 6  . gabapentin (NEURONTIN) 300 MG capsule Take 1 capsule by mouth 3  times daily 270 capsule 2  . hydrALAZINE (APRESOLINE) 50 MG tablet Take 1 tablet (50 mg total) by mouth 3 (three) times daily. 90 tablet 11  . hydrocortisone-pramoxine (ANALPRAM HC) 2.5-1 % rectal cream Apply rectally twice daily. 30 g 1  . irbesartan (AVAPRO) 300 MG tablet Take 1 tablet (300 mg total) by mouth daily. 90 tablet 3  . lovastatin (MEVACOR) 40 MG tablet Take 1 tablet by mouth at  bedtime 90 tablet 1  . methocarbamol (ROBAXIN) 500 MG tablet Take 1 tablet (500 mg total) by mouth every 6 (six) hours as needed for muscle spasms. 80 tablet 0  . nystatin (MYCOSTATIN) 100000 UNIT/ML suspension USE AS DIRECTED 5 MLS (500,000 UNITS TOTAL) IN THE MOUTH OR THROAT 4 (FOUR) TIMES DAILY AS NEEDED. 60 mL 1  . ondansetron (ZOFRAN) 4 MG tablet Take 1 tablet (4 mg total) by mouth every 6 (six) hours as needed for nausea. 20 tablet 0  . oxybutynin (DITROPAN) 5 MG tablet Take 1 tablet by mouth 3  times daily 270 tablet 2  . pantoprazole (PROTONIX) 40 MG tablet Take 1 tablet by mouth  daily 90 tablet 2  . Polyethyl Glycol-Propyl Glycol (SYSTANE OP) Place 1 drop into both eyes. Three to four times daily.    . polyethylene glycol (MIRALAX / GLYCOLAX) packet Take 17 g by mouth daily as needed for mild constipation. 14 each 0  . traMADol (ULTRAM) 50 MG tablet TAKE 1 TO 2 TABLETS BY MOUTH EVERY 6 HOURS AS NEEDED FOR SEVERE PAIN 60 tablet 5  . traZODone (DESYREL) 50 MG tablet Take 25-50 mg by mouth at bedtime as needed for sleep.     . traZODone (DESYREL) 50 MG tablet TAKE 0.5-1 TABLETS (25-50 MG TOTAL) BY MOUTH AT BEDTIME AS NEEDED FOR SLEEP. 60 tablet 5  . [DISCONTINUED] lansoprazole (PREVACID) 30 MG capsule Take 1 capsule (30 mg total) by mouth daily. 90 capsule 3    Current Facility-Administered Medications on File Prior to Visit  Medication Dose Route Frequency Provider Last Rate Last Dose  . tranexamic acid (CYKLOKAPRON) 2,000 mg in sodium chloride 0.9 % 50 mL Topical Application  123XX123 mg Topical Once [employer], PA-C         Review of Systems  Constitutional: Negative for unusual diaphoresis or night sweats HENT: Negative for ringing in ear or discharge Eyes: Negative for double vision or worsening visual disturbance.  Respiratory: Negative for choking and stridor.   Gastrointestinal: Negative for vomiting or other signifcant bowel change Genitourinary: Negative for hematuria or change in urine volume.  Musculoskeletal: Negative for other MSK pain or  swelling Skin: Negative for color change and worsening wound.  Neurological: Negative for tremors and numbness other than noted  Psychiatric/Behavioral: Negative for decreased concentration or agitation other than above       Objective:   Physical Exam BP 160/85 mmHg  Pulse 92  Temp(Src) 97.8 F (36.6 C) (Oral)  Resp 20  Wt 203 lb (92.08 kg)  SpO2 95% VS noted,  Constitutional: Pt appears in no significant distress HENT: Head: NCAT.  Right Ear: External ear normal.  Left Ear: External ear normal.  Eyes: . Pupils are equal, round, and reactive to light. Conjunctivae and EOM are normal Neck: Normal range of motion. Neck supple.  Cardiovascular: Normal rate and regular rhythm.   Pulmonary/Chest: Effort normal and breath sounds without rales or wheezing.  Abd:  Soft, NT, ND, + BS Neurological: Pt is alert. Not confused , motor grossly intact Skin: Skin is warm. No rash, no LE edema Psychiatric: Pt behavior is normal. No agitation.     Assessment & Plan:

## 2015-06-23 NOTE — Assessment & Plan Note (Signed)
stable overall by history and exam, recent data reviewed with pt, and pt to continue medical treatment as before,  to f/u any worsening symptoms or concerns Lab Results  Component Value Date   LDLCALC 93 05/04/2015

## 2015-06-23 NOTE — Patient Instructions (Signed)
Please continue all other medications as before, and refills have been done if requested.  Please have the pharmacy call with any other refills you may need.  Please continue your efforts at being more active, low cholesterol diet, and weight control.  You are otherwise up to date with prevention measures today.  Please keep your appointments with your specialists as you may have planned  Please return in 6 months, or sooner if needed, with Lab testing done 3-5 days before  

## 2015-06-23 NOTE — Assessment & Plan Note (Signed)
stable overall by history and exam, recent data reviewed with pt, and pt to continue medical treatment as before,  to f/u any worsening symptoms or concerns SpO2 Readings from Last 3 Encounters:  06/23/15 95%  05/04/15 94%  12/08/14 94%

## 2015-06-23 NOTE — Progress Notes (Signed)
Pre visit review using our clinic review tool, if applicable. No additional management support is needed unless otherwise documented below in the visit note. 

## 2015-06-30 ENCOUNTER — Other Ambulatory Visit: Payer: Self-pay | Admitting: Internal Medicine

## 2015-07-23 ENCOUNTER — Other Ambulatory Visit: Payer: Self-pay | Admitting: Internal Medicine

## 2015-07-29 DIAGNOSIS — B308 Other viral conjunctivitis: Secondary | ICD-10-CM | POA: Diagnosis not present

## 2015-08-06 ENCOUNTER — Ambulatory Visit (INDEPENDENT_AMBULATORY_CARE_PROVIDER_SITE_OTHER): Payer: Medicare Other | Admitting: Internal Medicine

## 2015-08-06 ENCOUNTER — Encounter: Payer: Self-pay | Admitting: Internal Medicine

## 2015-08-06 ENCOUNTER — Other Ambulatory Visit (INDEPENDENT_AMBULATORY_CARE_PROVIDER_SITE_OTHER): Payer: Medicare Other

## 2015-08-06 VITALS — BP 138/78 | HR 88 | Temp 97.7°F | Resp 20 | Wt 205.0 lb

## 2015-08-06 DIAGNOSIS — M545 Low back pain, unspecified: Secondary | ICD-10-CM

## 2015-08-06 DIAGNOSIS — I10 Essential (primary) hypertension: Secondary | ICD-10-CM | POA: Diagnosis not present

## 2015-08-06 DIAGNOSIS — J438 Other emphysema: Secondary | ICD-10-CM

## 2015-08-06 DIAGNOSIS — Z0001 Encounter for general adult medical examination with abnormal findings: Secondary | ICD-10-CM | POA: Diagnosis not present

## 2015-08-06 DIAGNOSIS — J309 Allergic rhinitis, unspecified: Secondary | ICD-10-CM | POA: Diagnosis not present

## 2015-08-06 DIAGNOSIS — Z0000 Encounter for general adult medical examination without abnormal findings: Secondary | ICD-10-CM

## 2015-08-06 DIAGNOSIS — G8929 Other chronic pain: Secondary | ICD-10-CM

## 2015-08-06 DIAGNOSIS — K140 Glossitis: Secondary | ICD-10-CM | POA: Diagnosis not present

## 2015-08-06 DIAGNOSIS — R6889 Other general symptoms and signs: Secondary | ICD-10-CM

## 2015-08-06 LAB — HEPATIC FUNCTION PANEL
ALBUMIN: 4.3 g/dL (ref 3.5–5.2)
ALK PHOS: 92 U/L (ref 39–117)
ALT: 20 U/L (ref 0–35)
AST: 23 U/L (ref 0–37)
Bilirubin, Direct: 0.1 mg/dL (ref 0.0–0.3)
TOTAL PROTEIN: 8.5 g/dL — AB (ref 6.0–8.3)
Total Bilirubin: 0.3 mg/dL (ref 0.2–1.2)

## 2015-08-06 LAB — CBC WITH DIFFERENTIAL/PLATELET
BASOS ABS: 0.1 10*3/uL (ref 0.0–0.1)
Basophils Relative: 0.7 % (ref 0.0–3.0)
EOS ABS: 0.4 10*3/uL (ref 0.0–0.7)
Eosinophils Relative: 5.8 % — ABNORMAL HIGH (ref 0.0–5.0)
HCT: 37.4 % (ref 36.0–46.0)
HEMOGLOBIN: 12.4 g/dL (ref 12.0–15.0)
Lymphocytes Relative: 35.2 % (ref 12.0–46.0)
Lymphs Abs: 2.6 10*3/uL (ref 0.7–4.0)
MCHC: 33.2 g/dL (ref 30.0–36.0)
MCV: 81.2 fl (ref 78.0–100.0)
MONO ABS: 0.8 10*3/uL (ref 0.1–1.0)
Monocytes Relative: 10.2 % (ref 3.0–12.0)
Neutro Abs: 3.6 10*3/uL (ref 1.4–7.7)
Neutrophils Relative %: 48.1 % (ref 43.0–77.0)
Platelets: 325 10*3/uL (ref 150.0–400.0)
RBC: 4.6 Mil/uL (ref 3.87–5.11)
RDW: 14.8 % (ref 11.5–15.5)
WBC: 7.5 10*3/uL (ref 4.0–10.5)

## 2015-08-06 LAB — LIPID PANEL
CHOLESTEROL: 156 mg/dL (ref 0–200)
HDL: 58.4 mg/dL (ref >39.00)
LDL CALC: 83 mg/dL (ref 0–99)
NonHDL: 97.24
TRIGLYCERIDES: 73 mg/dL (ref 0.0–149.0)
Total CHOL/HDL Ratio: 3
VLDL: 14.6 mg/dL (ref 0.0–40.0)

## 2015-08-06 LAB — BASIC METABOLIC PANEL
BUN: 12 mg/dL (ref 6–23)
CO2: 31 meq/L (ref 19–32)
Calcium: 11 mg/dL — ABNORMAL HIGH (ref 8.4–10.5)
Chloride: 100 mEq/L (ref 96–112)
Creatinine, Ser: 1.06 mg/dL (ref 0.40–1.20)
GFR: 64.73 mL/min (ref >60.00)
GLUCOSE: 94 mg/dL (ref 70–99)
POTASSIUM: 4 meq/L (ref 3.5–5.1)
SODIUM: 137 meq/L (ref 135–145)

## 2015-08-06 LAB — URINALYSIS, ROUTINE W REFLEX MICROSCOPIC
Bilirubin Urine: NEGATIVE
HGB URINE DIPSTICK: NEGATIVE
Ketones, ur: NEGATIVE
LEUKOCYTES UA: NEGATIVE
NITRITE: NEGATIVE
RBC / HPF: NONE SEEN (ref 0–?)
Specific Gravity, Urine: <=1.005 — AB (ref 1.000–1.030)
TOTAL PROTEIN, URINE-UPE24: NEGATIVE
URINE GLUCOSE: NEGATIVE
Urobilinogen, UA: 0.2 (ref 0.0–1.0)
WBC, UA: NONE SEEN (ref 0–?)
pH: 6.5 (ref 5.0–8.0)

## 2015-08-06 LAB — TSH: TSH: 0.94 u[IU]/mL (ref 0.35–4.50)

## 2015-08-06 MED ORDER — NYSTATIN 100000 UNIT/ML MT SUSP: OROMUCOSAL | Status: DC

## 2015-08-06 MED ORDER — TIZANIDINE HCL 4 MG PO TABS: 4.0000 mg | ORAL_TABLET | Freq: Four times a day (QID) | ORAL | Status: DC | PRN

## 2015-08-06 MED ORDER — CETIRIZINE HCL 10 MG PO TABS: 10.0000 mg | ORAL_TABLET | Freq: Every day | ORAL | Status: DC

## 2015-08-06 NOTE — Assessment & Plan Note (Signed)
Mild to mod, for zyrtec prn,  to f/u any worsening symptoms or concerns 

## 2015-08-06 NOTE — Assessment & Plan Note (Signed)
stable overall by history and exam, recent data reviewed with pt, and pt to continue medical treatment as before,  to f/u any worsening symptoms or concerns SpO2 Readings from Last 3 Encounters:  08/06/15 90%  06/23/15 95%  05/04/15 94%

## 2015-08-06 NOTE — Assessment & Plan Note (Signed)
Chronic persistent with mild worsening c/w msk strain in the past few days, also for muscle relaxer prn,  to f/u any worsening symptoms or concerns, also for lab including UA

## 2015-08-06 NOTE — Patient Instructions (Addendum)
Please take all new medication as prescribed   - to try the Nystatin for possible thrush, and zyrtec for the allergies,, and the tizanidine for the muscle relaxer  Please continue all other medications as before, and refills have been done if requested.  Please have the pharmacy call with any other refills you may need.  Please continue your efforts at being more active, low cholesterol diet, and weight control.  You are otherwise up to date with prevention measures today.  Please keep your appointments with your specialists as you may have planned  Please go to the LAB in the Basement (turn left off the elevator) for the tests to be done today  You will be contacted by phone if any changes need to be made immediately.  Otherwise, you will receive a letter about your results with an explanation, but please check with MyChart first.  Please remember to sign up for MyChart if you have not done so, as this will be important to you in the future with finding out test results, communicating by private email, and scheduling acute appointments online when needed.  Please return in 6 months, or sooner if needed

## 2015-08-06 NOTE — Assessment & Plan Note (Signed)
stable overall by history and exam, recent data reviewed with pt, and pt to continue medical treatment as before,  to f/u any worsening symptoms or concerns BP Readings from Last 3 Encounters:  08/06/15 138/78  06/23/15 160/85  05/04/15 210/92

## 2015-08-06 NOTE — Assessment & Plan Note (Addendum)
Chronic persistent assoc with dry mouth, to cont hard candies, also try nystatin soln qid prn,  to f/u any worsening symptoms or concerns  In addition to the time spent performing CPE, I spent an additional 40 minutes face to face,in which greater than 50% of this time was spent in counseling and coordination of care for patient's acute illness as documented.

## 2015-08-06 NOTE — Progress Notes (Signed)
Subjective:    Patient ID: Wendy Torres, female    DOB: 14-Mar-1941, 75 y.o.   MRN: [id number]  HPI  Here for wellness and f/u;  Overall doing ok;  Pt denies Chest pain, worsening SOB, DOE, wheezing, orthopnea, PND, worsening LE edema, palpitations, dizziness or syncope.  Pt denies neurological change such as new headache, facial or extremity weakness.  Pt denies polydipsia, polyuria, or low sugar symptoms. Pt states overall good compliance with treatment and medications, good tolerability, and has been trying to follow appropriate diet.  Pt denies worsening depressive symptoms, suicidal ideation or panic. No fever, night sweats, wt loss, loss of appetite, or other constitutional symptoms.  Pt states good ability with ADL's, has low fall risk, home safety reviewed and adequate, no other significant changes in hearing or vision, and only occasionally active with exercise.Also with c/o Tongue with soreness and bumps, longstanding more then 1 yr, assoc with dry mouth., magic mouthwash not working.  No dry eyes, fever but also Does have several wks ongoing nasal allergy symptoms with clearish congestion, itch and sneezing, without fever, pain, ST, swelling or wheezing, but with scant prod cough clearish and yellow sputum.  Pt continues to have recurring left LBP without change in severity for several months except worse in the past 2-3 days,,but no bowel or bladder change, fever, wt loss,  worsening LE pain/numbness/weakness, gait change or falls. Past Medical History  Diagnosis Date  . HYPERLIPIDEMIA 04/18/2007  . Morbid obesity ([place]) 12/09/2006  . ANEMIA 01/14/2009  . Chronic pain syndrome 02/16/2009  . HYPERTENSION 12/09/2006  . ACUTE URIS OF UNSPECIFIED SITE 10/22/2007  . ALLERGIC RHINITIS 12/09/2006  . ASTHMA 12/09/2006  . COPD 12/09/2006  . Glossitis 01/14/2009  . GERD 12/09/2006  . HIATAL HERNIA 03/01/2010  . OVERACTIVE BLADDER 04/14/2008  . DEGENERATIVE JOINT DISEASE 12/09/2006  . LOW BACK PAIN  12/09/2006  . OSTEOPOROSIS 12/09/2006  . SYNCOPE 11/05/2008  . DIZZINESS 03/30/2010  . INSOMNIA-SLEEP DISORDER-UNSPEC 11/05/2008  . HYPERSOMNIA 08/31/2008  . PALPITATIONS, RECURRENT 01/27/2010  . FREQUENCY, URINARY 03/30/2010  . Nonspecific (abnormal) findings on radiological and other examination of body structure 11/05/2008  . TRANSIENT ISCHEMIC ATTACK, HX OF 12/09/2006  . HELICOBACTER PYLORI GASTRITIS, HX OF 12/09/2006  . ABNORMAL CHEST XRAY 11/05/2008  . DYSPNEA 01/27/2010    with heavy exertion  . Arthritis   . Anxiety   . Blood transfusion without reported diagnosis   . Cataract     bilateral -removed  . Stroke [facility])     pt. stated light one years ago   Past Surgical History  Procedure Laterality Date  . Tubal ligation    . Lumbar disc surgery    . Colonoscopy    . Tonsillectomy      as a child  . Total hip arthroplasty Right 11/06/2012    Procedure: RIGHT TOTAL HIP ARTHROPLASTY WITH ACETABULAR AUTOGRAFT;  Surgeon: Gearlean Alf, MD;  Location: [facility];  Service: Orthopedics;  Laterality: Right;  . Total hip arthroplasty Left 02/18/2014    Procedure: LEFT TOTAL HIP ARTHROPLASTY;  Surgeon: Gearlean Alf, MD;  Location: [facility];  Service: Orthopedics;  Laterality: Left;    reports that she quit smoking about 42 years ago. Her smoking use included Cigarettes. She has a 12.5 pack-year smoking history. She has never used smokeless tobacco. She reports that she does not drink alcohol or use illicit drugs. family history includes Heart disease in her mother; Hypertension in her brother; Lung cancer in her  father. There is no history of Diabetes, Colon cancer, Colon polyps, Kidney disease, Gallbladder disease, or Esophageal cancer. No Known Allergies Current Outpatient Prescriptions on File Prior to Visit  Medication Sig Dispense Refill  . acetaminophen (TYLENOL) 325 MG tablet Take 2 tablets (650 mg total) by mouth every 6 (six) hours as needed for mild pain (or Fever >/= 101). 40 tablet 0  .  ALPRAZolam (XANAX) 0.25 MG tablet Take 1 tablet (0.25 mg total) by mouth 2 (two) times daily as needed. 60 tablet 2  . amLODipine (NORVASC) 10 MG tablet Take 1 tablet by mouth  every morning 90 tablet 2  . bisacodyl (DULCOLAX) 10 MG suppository Place 1 suppository (10 mg total) rectally daily as needed for moderate constipation. 12 suppository 0  . cloNIDine (CATAPRES) 0.3 MG tablet Take 1 tablet by mouth two  times daily 180 tablet 2  . docusate sodium 100 MG CAPS Take 100 mg by mouth 2 (two) times daily. 10 capsule 0  . furosemide (LASIX) 20 MG tablet Take 1 tablet by mouth  every morning 90 tablet 1  . gabapentin (NEURONTIN) 300 MG capsule TAKE 1 CAPSULE (300 MG TOTAL) BY MOUTH 3 (THREE) TIMES DAILY. 90 capsule 6  . gabapentin (NEURONTIN) 300 MG capsule Take 1 capsule by mouth 3  times daily 270 capsule 2  . hydrALAZINE (APRESOLINE) 50 MG tablet Take 1 tablet (50 mg total) by mouth 3 (three) times daily. 90 tablet 11  . hydrocortisone-pramoxine (ANALPRAM HC) 2.5-1 % rectal cream Apply rectally twice daily. 30 g 1  . irbesartan (AVAPRO) 300 MG tablet Take 1 tablet (300 mg total) by mouth daily. 90 tablet 3  . lovastatin (MEVACOR) 40 MG tablet Take 1 tablet by mouth at  bedtime 90 tablet 1  . methocarbamol (ROBAXIN) 500 MG tablet Take 1 tablet (500 mg total) by mouth every 6 (six) hours as needed for muscle spasms. 80 tablet 0  . ondansetron (ZOFRAN) 4 MG tablet Take 1 tablet (4 mg total) by mouth every 6 (six) hours as needed for nausea. 20 tablet 0  . oxybutynin (DITROPAN) 5 MG tablet Take 1 tablet by mouth 3  times daily 270 tablet 2  . pantoprazole (PROTONIX) 40 MG tablet Take 1 tablet by mouth  daily 90 tablet 2  . Polyethyl Glycol-Propyl Glycol (SYSTANE OP) Place 1 drop into both eyes. Three to four times daily.    . polyethylene glycol (MIRALAX / GLYCOLAX) packet Take 17 g by mouth daily as needed for mild constipation. 14 each 0  . traMADol (ULTRAM) 50 MG tablet TAKE 1 TO 2 TABLETS BY  MOUTH EVERY 6 HOURS AS NEEDED FOR SEVERE PAIN 60 tablet 5  . traZODone (DESYREL) 50 MG tablet Take 25-50 mg by mouth at bedtime as needed for sleep.     . traZODone (DESYREL) 50 MG tablet TAKE 0.5-1 TABLETS (25-50 MG TOTAL) BY MOUTH AT BEDTIME AS NEEDED FOR SLEEP. 60 tablet 5  . [DISCONTINUED] lansoprazole (PREVACID) 30 MG capsule Take 1 capsule (30 mg total) by mouth daily. 90 capsule 3   Current Facility-Administered Medications on File Prior to Visit  Medication Dose Route Frequency Provider Last Rate Last Dose  . tranexamic acid (CYKLOKAPRON) 2,000 mg in sodium chloride 0.9 % 50 mL Topical Application  123XX123 mg Topical Once [employer], PA-C       Review of Systems Constitutional: Negative for increased diaphoresis, or other activity, appetite or siginficant weight change other than noted HENT: Negative for worsening hearing loss,  ear pain, facial swelling, mouth sores and neck stiffness.   Eyes: Negative for other worsening pain, redness or visual disturbance.  Respiratory: Negative for choking or stridor Cardiovascular: Negative for other chest pain and palpitations.  Gastrointestinal: Negative for worsening diarrhea, blood in stool, or abdominal distention Genitourinary: Negative for hematuria, flank pain or change in urine volume.  Musculoskeletal: Negative for myalgias or other joint complaints.  Skin: Negative for other color change and wound or drainage.  Neurological: Negative for syncope and numbness. other than noted Hematological: Negative for adenopathy. or other swelling Psychiatric/Behavioral: Negative for hallucinations, SI, self-injury, decreased concentration or other worsening agitation.      Objective:   Physical Exam BP 138/78 mmHg  Pulse 88  Temp(Src) 97.7 F (36.5 C) (Oral)  Resp 20  Wt 205 lb (92.987 kg)  SpO2 90% VS noted, not ill appearing Constitutional: Pt is oriented to person, place, and time. Appears well-developed and well-nourished, in no  significant distress Head: Normocephalic and atraumatic  Eyes: Conjunctivae and EOM are normal. Pupils are equal, round, and reactive to light Right Ear: External ear normal.  Left Ear: External ear normal Nose: Nose normal.  Mouth/Throat: Oropharynx is clear and moist tongue with mild diffuse swelling and erythema, no ulcer or mass Neck: Normal range of motion. Neck supple. No JVD present. No tracheal deviation present or significant neck LA or mass Cardiovascular: Normal rate, regular rhythm, normal heart sounds and intact distal pulses.   Pulmonary/Chest: Effort normal and breath sounds without rales or wheezing  Abdominal: Soft. Bowel sounds are normal. NT. No HSM  Musculoskeletal: Normal range of motion. Exhibits no edema; has + tender to left lumbar paravertebral tender/spasm, spine o/w nontender throughout in the midline Lymphadenopathy: Has no cervical adenopathy.  Neurological: Pt is alert and oriented to person, place, and time. Pt has normal reflexes. No cranial nerve deficit. Motor grossly intact Skin: Skin is warm and dry. No rash noted or new ulcers Psychiatric:  Has normal mood and affect. Behavior is normal.     Assessment & Plan:

## 2015-08-06 NOTE — Progress Notes (Signed)
Pre visit review using our clinic review tool, if applicable. No additional management support is needed unless otherwise documented below in the visit note. 

## 2015-08-10 ENCOUNTER — Encounter: Payer: Self-pay | Admitting: Internal Medicine

## 2015-08-27 ENCOUNTER — Other Ambulatory Visit: Payer: Self-pay | Admitting: Internal Medicine

## 2015-08-30 ENCOUNTER — Other Ambulatory Visit: Payer: Self-pay | Admitting: Internal Medicine

## 2015-09-15 ENCOUNTER — Other Ambulatory Visit: Payer: Self-pay | Admitting: Internal Medicine

## 2015-09-16 ENCOUNTER — Ambulatory Visit (INDEPENDENT_AMBULATORY_CARE_PROVIDER_SITE_OTHER): Payer: Medicare Other | Admitting: Internal Medicine

## 2015-09-16 ENCOUNTER — Encounter: Payer: Self-pay | Admitting: Internal Medicine

## 2015-09-16 VITALS — BP 164/80 | HR 83 | Temp 98.0°F | Resp 20 | Wt 204.0 lb

## 2015-09-16 DIAGNOSIS — H0011 Chalazion right upper eyelid: Secondary | ICD-10-CM | POA: Diagnosis not present

## 2015-09-16 DIAGNOSIS — G894 Chronic pain syndrome: Secondary | ICD-10-CM

## 2015-09-16 DIAGNOSIS — I10 Essential (primary) hypertension: Secondary | ICD-10-CM

## 2015-09-16 MED ORDER — CLONIDINE HCL 0.3 MG PO TABS: 0.3000 mg | ORAL_TABLET | Freq: Two times a day (BID) | ORAL | Status: DC

## 2015-09-16 MED ORDER — TRAMADOL HCL 50 MG PO TABS: ORAL_TABLET | ORAL | Status: DC

## 2015-09-16 NOTE — Assessment & Plan Note (Signed)
stable overall by history and exam, recent data reviewed with pt, and pt to continue medical treatment as before,  to f/u any worsening symptoms or concerns Lab Results  Component Value Date   WBC 7.5 08/06/2015   HGB 12.4 08/06/2015   HCT 37.4 08/06/2015   PLT 325.0 08/06/2015   GLUCOSE 94 08/06/2015   CHOL 156 08/06/2015   TRIG 73.0 08/06/2015   HDL 58.40 08/06/2015   LDLCALC 83 08/06/2015   ALT 20 08/06/2015   AST 23 08/06/2015   NA 137 08/06/2015   K 4.0 08/06/2015   CL 100 08/06/2015   CREATININE 1.06 08/06/2015   BUN 12 08/06/2015   CO2 31 08/06/2015   TSH 0.94 08/06/2015   INR 1.02 02/10/2014

## 2015-09-16 NOTE — Progress Notes (Signed)
Subjective:    Patient ID: Wendy Torres, female    DOB: 10-16-1940, 75 y.o.   MRN: [id number]  HPI  Here to f/u, had noticed a stye last wk to the edge of the right upper eyelid, then last 2-3 days has really become swollen, and has had slight d/c of material, no fever, pain or worsening eyelid red/swelling or drainage itself. No vision difficulty except for "it feels kind of itchy."  No HA, sinus congestion, ST, cough and Pt denies chest pain, increased sob or doe, wheezing, orthopnea, PND, increased LE swelling, palpitations, dizziness or syncope.  Pt denies new neurological symptoms such as new headache, or facial or extremity weakness or numbness  Also chronic pain is well controlled, asks for tramadol prn.   Pt denies polydipsia, polyuria  Past Medical History  Diagnosis Date  . HYPERLIPIDEMIA 04/18/2007  . Morbid obesity ([place]) 12/09/2006  . ANEMIA 01/14/2009  . Chronic pain syndrome 02/16/2009  . HYPERTENSION 12/09/2006  . ACUTE URIS OF UNSPECIFIED SITE 10/22/2007  . ALLERGIC RHINITIS 12/09/2006  . ASTHMA 12/09/2006  . COPD 12/09/2006  . Glossitis 01/14/2009  . GERD 12/09/2006  . HIATAL HERNIA 03/01/2010  . OVERACTIVE BLADDER 04/14/2008  . DEGENERATIVE JOINT DISEASE 12/09/2006  . LOW BACK PAIN 12/09/2006  . OSTEOPOROSIS 12/09/2006  . SYNCOPE 11/05/2008  . DIZZINESS 03/30/2010  . INSOMNIA-SLEEP DISORDER-UNSPEC 11/05/2008  . HYPERSOMNIA 08/31/2008  . PALPITATIONS, RECURRENT 01/27/2010  . FREQUENCY, URINARY 03/30/2010  . Nonspecific (abnormal) findings on radiological and other examination of body structure 11/05/2008  . TRANSIENT ISCHEMIC ATTACK, HX OF 12/09/2006  . HELICOBACTER PYLORI GASTRITIS, HX OF 12/09/2006  . ABNORMAL CHEST XRAY 11/05/2008  . DYSPNEA 01/27/2010    with heavy exertion  . Arthritis   . Anxiety   . Blood transfusion without reported diagnosis   . Cataract     bilateral -removed  . Stroke [facility])     pt. stated light one years ago   Past Surgical History  Procedure  Laterality Date  . Tubal ligation    . Lumbar disc surgery    . Colonoscopy    . Tonsillectomy      as a child  . Total hip arthroplasty Right 11/06/2012    Procedure: RIGHT TOTAL HIP ARTHROPLASTY WITH ACETABULAR AUTOGRAFT;  Surgeon: Gearlean Alf, MD;  Location: [facility];  Service: Orthopedics;  Laterality: Right;  . Total hip arthroplasty Left 02/18/2014    Procedure: LEFT TOTAL HIP ARTHROPLASTY;  Surgeon: Gearlean Alf, MD;  Location: [facility];  Service: Orthopedics;  Laterality: Left;    reports that she quit smoking about 42 years ago. Her smoking use included Cigarettes. She has a 12.5 pack-year smoking history. She has never used smokeless tobacco. She reports that she does not drink alcohol or use illicit drugs. family history includes Heart disease in her mother; Hypertension in her brother; Lung cancer in her father. There is no history of Diabetes, Colon cancer, Colon polyps, Kidney disease, Gallbladder disease, or Esophageal cancer. No Known Allergies Current Outpatient Prescriptions on File Prior to Visit  Medication Sig Dispense Refill  . acetaminophen (TYLENOL) 325 MG tablet Take 2 tablets (650 mg total) by mouth every 6 (six) hours as needed for mild pain (or Fever >/= 101). 40 tablet 0  . ALPRAZolam (XANAX) 0.25 MG tablet Take 1 tablet (0.25 mg total) by mouth 2 (two) times daily as needed. 60 tablet 2  . amLODipine (NORVASC) 10 MG tablet Take 1 tablet by mouth  every  morning 90 tablet 2  . benazepril (LOTENSIN) 40 MG tablet Take 1 tablet by mouth  daily 90 tablet 0  . bisacodyl (DULCOLAX) 10 MG suppository Place 1 suppository (10 mg total) rectally daily as needed for moderate constipation. 12 suppository 0  . cetirizine (ZYRTEC) 10 MG tablet Take 1 tablet (10 mg total) by mouth daily. 30 tablet 11  . cloNIDine (CATAPRES) 0.3 MG tablet Take 1 tablet by mouth two  times daily 180 tablet 2  . docusate sodium 100 MG CAPS Take 100 mg by mouth 2 (two) times daily. 10 capsule 0  .  furosemide (LASIX) 20 MG tablet Take 1 tablet by mouth  every morning 90 tablet 0  . gabapentin (NEURONTIN) 300 MG capsule TAKE 1 CAPSULE (300 MG TOTAL) BY MOUTH 3 (THREE) TIMES DAILY. 90 capsule 6  . gabapentin (NEURONTIN) 300 MG capsule Take 1 capsule by mouth 3  times daily 270 capsule 2  . hydrALAZINE (APRESOLINE) 25 MG tablet Take 1 tablet by mouth 3  times daily 270 tablet 0  . hydrALAZINE (APRESOLINE) 50 MG tablet Take 1 tablet (50 mg total) by mouth 3 (three) times daily. 90 tablet 11  . hydrocortisone-pramoxine (ANALPRAM HC) 2.5-1 % rectal cream Apply rectally twice daily. 30 g 1  . irbesartan (AVAPRO) 300 MG tablet Take 1 tablet (300 mg total) by mouth daily. 90 tablet 3  . lovastatin (MEVACOR) 40 MG tablet Take 1 tablet by mouth at  bedtime 90 tablet 0  . methocarbamol (ROBAXIN) 500 MG tablet Take 1 tablet (500 mg total) by mouth every 6 (six) hours as needed for muscle spasms. 80 tablet 0  . nystatin (MYCOSTATIN) 100000 UNIT/ML suspension TAKE 1 TEASPOONFUL IN THE MOUTH OR THROAT 4 TIMES A DAY AS NEEDED 60 mL 1  . ondansetron (ZOFRAN) 4 MG tablet Take 1 tablet (4 mg total) by mouth every 6 (six) hours as needed for nausea. 20 tablet 0  . oxybutynin (DITROPAN) 5 MG tablet Take 1 tablet by mouth 3  times daily 270 tablet 2  . pantoprazole (PROTONIX) 40 MG tablet Take 1 tablet by mouth  daily 90 tablet 2  . Polyethyl Glycol-Propyl Glycol (SYSTANE OP) Place 1 drop into both eyes. Three to four times daily.    . polyethylene glycol (MIRALAX / GLYCOLAX) packet Take 17 g by mouth daily as needed for mild constipation. 14 each 0  . tiZANidine (ZANAFLEX) 4 MG tablet Take 1 tablet (4 mg total) by mouth every 6 (six) hours as needed for muscle spasms. 50 tablet 1  . traZODone (DESYREL) 50 MG tablet Take 25-50 mg by mouth at bedtime as needed for sleep.     . traZODone (DESYREL) 50 MG tablet TAKE 0.5-1 TABLETS (25-50 MG TOTAL) BY MOUTH AT BEDTIME AS NEEDED FOR SLEEP. 60 tablet 5  . [DISCONTINUED]  lansoprazole (PREVACID) 30 MG capsule Take 1 capsule (30 mg total) by mouth daily. 90 capsule 3   Current Facility-Administered Medications on File Prior to Visit  Medication Dose Route Frequency Provider Last Rate Last Dose  . tranexamic acid (CYKLOKAPRON) 2,000 mg in sodium chloride 0.9 % 50 mL Topical Application  123XX123 mg Topical Once [employer], PA-C       Review of Systems All otherwise neg per pt     Objective:   Physical Exam BP 164/80 mmHg  Pulse 83  Temp(Src) 98 F (36.7 C) (Oral)  Resp 20  Wt 204 lb (92.534 kg)  SpO2 89% VS noted,  Constitutional: Pt  appears in no apparent distress HENT: Head: NCAT.  Right Ear: External ear normal.  Left Ear: External ear normal.  Eyes: . Pupils are equal, round, and reactive to light. Conjunctivae and EOM are normal, visual fields ok, but has 12 mm large chalazion mild tender to mid right upper eyelid Neck: Normal range of motion. Neck supple.  Cardiovascular: Normal rate and regular rhythm.   Pulmonary/Chest: Effort normal and breath sounds without rales or wheezing.  Neurological: Pt is alert. Not confused , motor grossly intact Skin: Skin is warm. No rash, no LE edema Psychiatric: Pt behavior is normal. No agitation.     Assessment & Plan:

## 2015-09-16 NOTE — Assessment & Plan Note (Signed)
Large new onset, no evidence for cellulitis or abscess but will need optho referral, to see [facility] today

## 2015-09-16 NOTE — Progress Notes (Signed)
Pre visit review using our clinic review tool, if applicable. No additional management support is needed unless otherwise documented below in the visit note. 

## 2015-09-16 NOTE — Assessment & Plan Note (Signed)
elev today, states is currently out of clonidine and will nmot have for 1 wk, will send local pharmacy a clonidine rx, o/w stable overall by history and exam, recent data reviewed with pt, and pt to continue medical treatment as before,  to f/u any worsening symptoms or concerns BP Readings from Last 3 Encounters:  09/16/15 164/80  08/06/15 138/78  06/23/15 160/85

## 2015-09-16 NOTE — Patient Instructions (Signed)
Please continue all other medications as before, and refills have been done if requested - the clonidine to the [facility]  Please have the pharmacy call with any other refills you may need.  Please keep your appointments with your specialists as you may have planned  Please see Stanton Kidney at our [facility] office today with help to getting an Eye doctor appt

## 2015-09-20 DIAGNOSIS — H0011 Chalazion right upper eyelid: Secondary | ICD-10-CM | POA: Diagnosis not present

## 2015-09-21 ENCOUNTER — Other Ambulatory Visit: Payer: Self-pay | Admitting: *Deleted

## 2015-09-21 MED ORDER — HYDRALAZINE HCL 50 MG PO TABS: 50.0000 mg | ORAL_TABLET | Freq: Three times a day (TID) | ORAL | Status: DC

## 2015-10-10 ENCOUNTER — Other Ambulatory Visit: Payer: Self-pay | Admitting: Internal Medicine

## 2015-10-12 NOTE — Telephone Encounter (Signed)
Done hardcopy to Corinne  

## 2015-10-12 NOTE — Telephone Encounter (Signed)
Medication refill sent to pharmacy  

## 2015-11-15 ENCOUNTER — Other Ambulatory Visit: Payer: Self-pay | Admitting: *Deleted

## 2015-11-15 MED ORDER — TIZANIDINE HCL 4 MG PO TABS: 4.0000 mg | ORAL_TABLET | Freq: Four times a day (QID) | ORAL | Status: DC | PRN

## 2016-01-31 ENCOUNTER — Other Ambulatory Visit: Payer: Self-pay | Admitting: Internal Medicine

## 2016-02-01 ENCOUNTER — Other Ambulatory Visit: Payer: Self-pay | Admitting: Internal Medicine

## 2016-02-01 NOTE — Telephone Encounter (Signed)
Done hardcopy to Corinne  

## 2016-02-01 NOTE — Telephone Encounter (Signed)
Script faxed bck to [facility].../lmb 

## 2016-02-02 NOTE — Telephone Encounter (Signed)
Tramadol refill already done oct 3

## 2016-02-06 ENCOUNTER — Other Ambulatory Visit: Payer: Self-pay | Admitting: Internal Medicine

## 2016-02-09 ENCOUNTER — Other Ambulatory Visit: Payer: Self-pay | Admitting: Internal Medicine

## 2016-02-12 DIAGNOSIS — M1711 Unilateral primary osteoarthritis, right knee: Secondary | ICD-10-CM | POA: Diagnosis not present

## 2016-02-16 ENCOUNTER — Other Ambulatory Visit: Payer: Self-pay | Admitting: Internal Medicine

## 2016-03-07 ENCOUNTER — Other Ambulatory Visit: Payer: Self-pay | Admitting: Internal Medicine

## 2016-03-14 DIAGNOSIS — M1711 Unilateral primary osteoarthritis, right knee: Secondary | ICD-10-CM | POA: Diagnosis not present

## 2016-03-21 DIAGNOSIS — M1711 Unilateral primary osteoarthritis, right knee: Secondary | ICD-10-CM | POA: Diagnosis not present

## 2016-03-27 DIAGNOSIS — M1711 Unilateral primary osteoarthritis, right knee: Secondary | ICD-10-CM | POA: Diagnosis not present

## 2016-04-01 ENCOUNTER — Other Ambulatory Visit: Payer: Self-pay | Admitting: Internal Medicine

## 2016-04-03 ENCOUNTER — Other Ambulatory Visit: Payer: Self-pay | Admitting: Internal Medicine

## 2016-04-04 NOTE — Telephone Encounter (Signed)
Done hardcopy to Corinne  

## 2016-04-04 NOTE — Telephone Encounter (Signed)
faxed

## 2016-04-24 ENCOUNTER — Other Ambulatory Visit: Payer: Self-pay | Admitting: Internal Medicine

## 2016-04-27 ENCOUNTER — Other Ambulatory Visit: Payer: Self-pay | Admitting: Internal Medicine

## 2016-05-08 DIAGNOSIS — M1711 Unilateral primary osteoarthritis, right knee: Secondary | ICD-10-CM | POA: Diagnosis not present

## 2016-05-31 ENCOUNTER — Ambulatory Visit (INDEPENDENT_AMBULATORY_CARE_PROVIDER_SITE_OTHER): Payer: Medicare Other | Admitting: Internal Medicine

## 2016-05-31 ENCOUNTER — Other Ambulatory Visit (INDEPENDENT_AMBULATORY_CARE_PROVIDER_SITE_OTHER): Payer: Medicare Other

## 2016-05-31 VITALS — BP 138/80 | HR 88 | Temp 98.4°F | Resp 20 | Wt 206.0 lb

## 2016-05-31 DIAGNOSIS — Z0001 Encounter for general adult medical examination with abnormal findings: Secondary | ICD-10-CM | POA: Diagnosis not present

## 2016-05-31 DIAGNOSIS — R778 Other specified abnormalities of plasma proteins: Secondary | ICD-10-CM | POA: Insufficient documentation

## 2016-05-31 DIAGNOSIS — R059 Cough, unspecified: Secondary | ICD-10-CM

## 2016-05-31 DIAGNOSIS — R05 Cough: Secondary | ICD-10-CM

## 2016-05-31 DIAGNOSIS — J441 Chronic obstructive pulmonary disease with (acute) exacerbation: Secondary | ICD-10-CM | POA: Insufficient documentation

## 2016-05-31 LAB — LIPID PANEL
CHOL/HDL RATIO: 3
Cholesterol: 162 mg/dL (ref 0–200)
HDL: 55.9 mg/dL (ref >39.00)
LDL CALC: 88 mg/dL (ref 0–99)
NONHDL: 106.28
Triglycerides: 90 mg/dL (ref 0.0–149.0)
VLDL: 18 mg/dL (ref 0.0–40.0)

## 2016-05-31 LAB — URINALYSIS, ROUTINE W REFLEX MICROSCOPIC
Bilirubin Urine: NEGATIVE
Hgb urine dipstick: NEGATIVE
Ketones, ur: NEGATIVE
Nitrite: NEGATIVE
PH: 6.5 (ref 5.0–8.0)
RBC / HPF: NONE SEEN (ref 0–?)
Specific Gravity, Urine: <=1.005 — AB (ref 1.000–1.030)
TOTAL PROTEIN, URINE-UPE24: NEGATIVE
URINE GLUCOSE: NEGATIVE
UROBILINOGEN UA: 0.2 (ref 0.0–1.0)

## 2016-05-31 LAB — CBC WITH DIFFERENTIAL/PLATELET
BASOS PCT: 1.4 % (ref 0.0–3.0)
Basophils Absolute: 0.1 10*3/uL (ref 0.0–0.1)
EOS PCT: 4.7 % (ref 0.0–5.0)
Eosinophils Absolute: 0.3 10*3/uL (ref 0.0–0.7)
HCT: 33.6 % — ABNORMAL LOW (ref 36.0–46.0)
HEMOGLOBIN: 11 g/dL — AB (ref 12.0–15.0)
LYMPHS ABS: 2.5 10*3/uL (ref 0.7–4.0)
Lymphocytes Relative: 34.7 % (ref 12.0–46.0)
MCHC: 32.5 g/dL (ref 30.0–36.0)
MCV: 79.7 fl (ref 78.0–100.0)
MONO ABS: 0.7 10*3/uL (ref 0.1–1.0)
Monocytes Relative: 9.8 % (ref 3.0–12.0)
NEUTROS ABS: 3.6 10*3/uL (ref 1.4–7.7)
Neutrophils Relative %: 49.4 % (ref 43.0–77.0)
Platelets: 336 10*3/uL (ref 150.0–400.0)
RBC: 4.22 Mil/uL (ref 3.87–5.11)
RDW: 15.5 % (ref 11.5–15.5)
WBC: 7.2 10*3/uL (ref 4.0–10.5)

## 2016-05-31 LAB — BASIC METABOLIC PANEL
BUN: 12 mg/dL (ref 6–23)
CO2: 28 mEq/L (ref 19–32)
Calcium: 10.6 mg/dL — ABNORMAL HIGH (ref 8.4–10.5)
Chloride: 104 mEq/L (ref 96–112)
Creatinine, Ser: 1.06 mg/dL (ref 0.40–1.20)
GFR: 64.59 mL/min (ref >60.00)
Glucose, Bld: 90 mg/dL (ref 70–99)
Potassium: 3.9 mEq/L (ref 3.5–5.1)
SODIUM: 138 meq/L (ref 135–145)

## 2016-05-31 LAB — HEPATIC FUNCTION PANEL
ALK PHOS: 85 U/L (ref 39–117)
ALT: 10 U/L (ref 0–35)
AST: 16 U/L (ref 0–37)
Albumin: 4.1 g/dL (ref 3.5–5.2)
BILIRUBIN DIRECT: 0 mg/dL (ref 0.0–0.3)
BILIRUBIN TOTAL: 0.2 mg/dL (ref 0.2–1.2)
Total Protein: 8.2 g/dL (ref 6.0–8.3)

## 2016-05-31 LAB — TSH: TSH: 0.76 u[IU]/mL (ref 0.35–4.50)

## 2016-05-31 MED ORDER — ALBUTEROL SULFATE HFA 108 (90 BASE) MCG/ACT IN AERS: 2.0000 | INHALATION_SPRAY | Freq: Four times a day (QID) | RESPIRATORY_TRACT | 11 refills | Status: DC | PRN

## 2016-05-31 MED ORDER — AZITHROMYCIN 250 MG PO TABS: ORAL_TABLET | ORAL | 0 refills | Status: DC

## 2016-05-31 MED ORDER — TRAMADOL HCL 50 MG PO TABS: ORAL_TABLET | ORAL | 5 refills | Status: DC

## 2016-05-31 MED ORDER — AZITHROMYCIN 250 MG PO TABS: ORAL_TABLET | ORAL | 1 refills | Status: DC

## 2016-05-31 MED ORDER — PREDNISONE 10 MG PO TABS: ORAL_TABLET | ORAL | 0 refills | Status: DC

## 2016-05-31 MED ORDER — HYDROCODONE-HOMATROPINE 5-1.5 MG/5ML PO SYRP: 5.0000 mL | ORAL_SOLUTION | Freq: Four times a day (QID) | ORAL | 0 refills | Status: AC | PRN

## 2016-05-31 NOTE — Assessment & Plan Note (Signed)
Mild, for depomedrol IM 80, predpac asd, inhaler prn,  to f/u any worsening symptoms or concerns

## 2016-05-31 NOTE — Progress Notes (Signed)
Pre visit review using our clinic review tool, if applicable. No additional management support is needed unless otherwise documented below in the visit note. 

## 2016-05-31 NOTE — Patient Instructions (Addendum)
You had the flu shot today, and the steroid shot  Please take all new medication as prescribed - the antibiotic, cough medicine, prednisone, and inhaler as needed  Please continue all other medications as before, and refills have been done if requested.  Please have the pharmacy call with any other refills you may need.  Please continue your efforts at being more active, low cholesterol diet, and weight control.  You are otherwise up to date with prevention measures today.  Please keep your appointments with your specialists as you may have planned  Please go to the LAB in the Basement (turn left off the elevator) for the tests to be done today  You will be contacted by phone if any changes need to be made immediately.  Otherwise, you will receive a letter about your results with an explanation, but please check with MyChart first.  Please remember to sign up for MyChart if you have not done so, as this will be important to you in the future with finding out test results, communicating by private email, and scheduling acute appointments online when needed.  Please return in 6 months, or sooner if needed

## 2016-05-31 NOTE — Assessment & Plan Note (Signed)

## 2016-05-31 NOTE — Progress Notes (Signed)
Subjective:    Patient ID: Wendy Torres, female    DOB: 09-Jan-1941, 76 y.o.   MRN: [id number]  HPI  Here for wellness and f/u;  Overall doing ok; Marland Kitchen  Pt denies neurological change such as new headache, facial or extremity weakness.  Pt denies polydipsia, polyuria, or low sugar symptoms. Pt states overall good compliance with treatment and medications, good tolerability, and has been trying to follow appropriate diet.  Pt denies worsening depressive symptoms, suicidal ideation or panic. No fever, night sweats, wt loss, loss of appetite, or other constitutional symptoms.  Pt states good ability with ADL's, has low fall risk, home safety reviewed and adequate, no other significant changes in hearing or vision, and only occasionally active with exercise. Walks with walker or cane.  Also:  Here with acute onset mild to mod 5 days ST, HA, general weakness and malaise, with prod cough greenish sputum, but Pt denies chest pain, increased sob or doe, wheezing, orthopnea, PND, increased LE swelling, palpitations, dizziness or syncope, except for onset wheezing and sob for 2 days.- asks for inhaler.  No other new history Past Medical History:  Diagnosis Date  . ABNORMAL CHEST XRAY 11/05/2008  . ACUTE URIS OF UNSPECIFIED SITE 10/22/2007  . ALLERGIC RHINITIS 12/09/2006  . ANEMIA 01/14/2009  . Anxiety   . Arthritis   . ASTHMA 12/09/2006  . Blood transfusion without reported diagnosis   . Cataract    bilateral -removed  . Chronic pain syndrome 02/16/2009  . COPD 12/09/2006  . DEGENERATIVE JOINT DISEASE 12/09/2006  . DIZZINESS 03/30/2010  . DYSPNEA 01/27/2010   with heavy exertion  . FREQUENCY, URINARY 03/30/2010  . GERD 12/09/2006  . Glossitis 01/14/2009  . HELICOBACTER PYLORI GASTRITIS, HX OF 12/09/2006  . HIATAL HERNIA 03/01/2010  . HYPERLIPIDEMIA 04/18/2007  . HYPERSOMNIA 08/31/2008  . HYPERTENSION 12/09/2006  . INSOMNIA-SLEEP DISORDER-UNSPEC 11/05/2008  . LOW BACK PAIN 12/09/2006  . Morbid obesity ([place])  12/09/2006  . Nonspecific (abnormal) findings on radiological and other examination of body structure 11/05/2008  . OSTEOPOROSIS 12/09/2006  . OVERACTIVE BLADDER 04/14/2008  . PALPITATIONS, RECURRENT 01/27/2010  . Stroke [facility])    pt. stated light one years ago  . SYNCOPE 11/05/2008  . TRANSIENT ISCHEMIC ATTACK, HX OF 12/09/2006   Past Surgical History:  Procedure Laterality Date  . COLONOSCOPY    . LUMBAR DISC SURGERY    . TONSILLECTOMY     as a child  . TOTAL HIP ARTHROPLASTY Right 11/06/2012   Procedure: RIGHT TOTAL HIP ARTHROPLASTY WITH ACETABULAR AUTOGRAFT;  Surgeon: Gearlean Alf, MD;  Location: [facility];  Service: Orthopedics;  Laterality: Right;  . TOTAL HIP ARTHROPLASTY Left 02/18/2014   Procedure: LEFT TOTAL HIP ARTHROPLASTY;  Surgeon: Gearlean Alf, MD;  Location: [facility];  Service: Orthopedics;  Laterality: Left;  . TUBAL LIGATION      reports that she quit smoking about 43 years ago. Her smoking use included Cigarettes. She has a 12.50 pack-year smoking history. She has never used smokeless tobacco. She reports that she does not drink alcohol or use drugs. family history includes Heart disease in her mother; Hypertension in her brother; Lung cancer in her father. No Known Allergies Current Outpatient Prescriptions on File Prior to Visit  Medication Sig Dispense Refill  . acetaminophen (TYLENOL) 325 MG tablet Take 2 tablets (650 mg total) by mouth every 6 (six) hours as needed for mild pain (or Fever >/= 101). 40 tablet 0  . ALPRAZolam (XANAX) 0.25 MG tablet  TAKE 1 TABLET TWICE DAILY AS NEEDED FOR ANXIETY 60 tablet 2  . amLODipine (NORVASC) 10 MG tablet TAKE 1 TABLET BY MOUTH  EVERY MORNING 90 tablet 0  . benazepril (LOTENSIN) 40 MG tablet TAKE 1 TABLET BY MOUTH  DAILY 90 tablet 0  . bisacodyl (DULCOLAX) 10 MG suppository Place 1 suppository (10 mg total) rectally daily as needed for moderate constipation. 12 suppository 0  . cetirizine (ZYRTEC) 10 MG tablet Take 1 tablet (10 mg  total) by mouth daily. 30 tablet 11  . cloNIDine (CATAPRES) 0.3 MG tablet Take 1 tablet by mouth two  times daily 180 tablet 2  . cloNIDine (CATAPRES) 0.3 MG tablet Take 1 tablet (0.3 mg total) by mouth 2 (two) times daily. 60 tablet 0  . cloNIDine (CATAPRES) 0.3 MG tablet TAKE 1 TABLET BY MOUTH TWO  TIMES DAILY 180 tablet 0  . docusate sodium 100 MG CAPS Take 100 mg by mouth 2 (two) times daily. 10 capsule 0  . furosemide (LASIX) 20 MG tablet TAKE 1 TABLET BY MOUTH  EVERY MORNING 90 tablet 0  . gabapentin (NEURONTIN) 300 MG capsule TAKE 1 CAPSULE (300 MG TOTAL) BY MOUTH 3 (THREE) TIMES DAILY. 90 capsule 6  . gabapentin (NEURONTIN) 300 MG capsule Take 1 capsule by mouth 3  times daily 270 capsule 2  . gabapentin (NEURONTIN) 300 MG capsule TAKE 1 CAPSULE BY MOUTH 3  TIMES DAILY 270 capsule 0  . hydrALAZINE (APRESOLINE) 25 MG tablet Take 1 tablet by mouth 3  times daily 270 tablet 0  . hydrALAZINE (APRESOLINE) 50 MG tablet Take 1 tablet (50 mg total) by mouth 3 (three) times daily. 270 tablet 3  . hydrocortisone-pramoxine (ANALPRAM HC) 2.5-1 % rectal cream Apply rectally twice daily. 30 g 1  . irbesartan (AVAPRO) 300 MG tablet Take 1 tablet (300 mg total) by mouth daily. 90 tablet 3  . lovastatin (MEVACOR) 40 MG tablet TAKE 1 TABLET BY MOUTH AT  BEDTIME 90 tablet 0  . methocarbamol (ROBAXIN) 500 MG tablet Take 1 tablet (500 mg total) by mouth every 6 (six) hours as needed for muscle spasms. 80 tablet 0  . nystatin (MYCOSTATIN) 100000 UNIT/ML suspension TAKE 1 TEASPOONFUL IN THE MOUTH OR THROAT 4 TIMES A DAY AS NEEDED 60 mL 1  . ondansetron (ZOFRAN) 4 MG tablet Take 1 tablet (4 mg total) by mouth every 6 (six) hours as needed for nausea. 20 tablet 0  . oxybutynin (DITROPAN) 5 MG tablet TAKE 1 TABLET BY MOUTH 3  TIMES DAILY 270 tablet 0  . pantoprazole (PROTONIX) 40 MG tablet TAKE 1 TABLET BY MOUTH  DAILY 90 tablet 0  . Polyethyl Glycol-Propyl Glycol (SYSTANE OP) Place 1 drop into both eyes. Three to  four times daily.    . polyethylene glycol (MIRALAX / GLYCOLAX) packet Take 17 g by mouth daily as needed for mild constipation. 14 each 0  . tiZANidine (ZANAFLEX) 4 MG tablet TAKE 1 TABLET (4 MG TOTAL) BY MOUTH EVERY 6 (SIX) HOURS AS NEEDED FOR MUSCLE SPASMS. 50 tablet 1  . traZODone (DESYREL) 50 MG tablet Take 25-50 mg by mouth at bedtime as needed for sleep.     . traZODone (DESYREL) 50 MG tablet TAKE 1/2 TO 1 TABLET BY MOUTH AT BEDTIME AS NEEDED FOR SLEEP 60 tablet 5  . [DISCONTINUED] lansoprazole (PREVACID) 30 MG capsule Take 1 capsule (30 mg total) by mouth daily. 90 capsule 3   Current Facility-Administered Medications on File Prior to Visit  Medication  Dose Route Frequency Provider Last Rate Last Dose  . tranexamic acid (CYKLOKAPRON) 2,000 mg in sodium chloride 0.9 % 50 mL Topical Application  123XX123 mg Topical Once [employer], PA-C        Review of Systems Constitutional: Negative for increased diaphoresis, or other activity, appetite or siginficant weight change other than noted HENT: Negative for worsening hearing loss, ear pain, facial swelling, mouth sores and neck stiffness.   Eyes: Negative for other worsening pain, redness or visual disturbance.  Respiratory: Negative for choking or stridor Cardiovascular: Negative for other chest pain and palpitations.  Gastrointestinal: Negative for worsening diarrhea, blood in stool, or abdominal distention Genitourinary: Negative for hematuria, flank pain or change in urine volume.  Musculoskeletal: Negative for myalgias or other joint complaints.  Skin: Negative for other color change and wound or drainage.  Neurological: Negative for syncope and numbness. other than noted Hematological: Negative for adenopathy. or other swelling Psychiatric/Behavioral: Negative for hallucinations, SI, self-injury, decreased concentration or other worsening agitation.  All other system neg per pt    Objective:   Physical Exam BP 138/80   Pulse  88   Temp 98.4 F (36.9 C) (Oral)   Resp 20   Wt 206 lb (93.4 kg)   SpO2 95%   BMI 35.36 kg/m  VS noted, mild ill appearing Constitutional: Pt is oriented to person, place, and time. Appears well-developed and well-nourished, in no significant distress Head: Normocephalic and atraumatic  Eyes: Conjunctivae and EOM are normal. Pupils are equal, round, and reactive to light Right Ear: External ear normal.  Left Ear: External ear normal Nose: Nose normal.  Bilat tm's with mild erythema.  Max sinus areas mild tender.  Pharynx with mild erythema, no exudate  Neck: Normal range of motion. Neck supple. No JVD present. No tracheal deviation present or significant neck LA or mass Cardiovascular: Normal rate, regular rhythm, normal heart sounds and intact distal pulses.   Pulmonary/Chest: Effort normal and breath sounds decreased without rales but with few scattered wheezing  Abdominal: Soft. Bowel sounds are normal. NT. No HSM  Musculoskeletal: Normal range of motion. Exhibits no edema Lymphadenopathy: Has no cervical adenopathy.  Neurological: Pt is alert and oriented to person, place, and time. Pt has normal reflexes. No cranial nerve deficit. Motor grossly intact Skin: Skin is warm and dry. No rash noted or new ulcers Psychiatric:  Has normal mood and affect. Behavior is normal.  No other new exam findings    Assessment & Plan:

## 2016-05-31 NOTE — Assessment & Plan Note (Addendum)
Mild to mod, c/w bronchitis vs pna, declines cxr, for antibx course, cough med prn,  to f/u any worsening symptoms or concerns  In addition to the time spent performing CPE, I spent an additional 25 minutes face to face,in which greater than 50% of this time was spent in counseling and coordination of care for patient's acute illness as documented.

## 2016-05-31 NOTE — Assessment & Plan Note (Signed)
Mild persistent, for SPEP,  to f/u any worsening symptoms or concerns

## 2016-06-02 ENCOUNTER — Encounter: Payer: Self-pay | Admitting: Internal Medicine

## 2016-06-02 ENCOUNTER — Other Ambulatory Visit (INDEPENDENT_AMBULATORY_CARE_PROVIDER_SITE_OTHER): Payer: Medicare Other

## 2016-06-02 DIAGNOSIS — D649 Anemia, unspecified: Secondary | ICD-10-CM

## 2016-06-02 LAB — PROTEIN ELECTROPHORESIS, SERUM, WITH REFLEX
ALBUMIN ELP: 3.8 g/dL (ref 3.8–4.8)
Alpha-1-Globulin: 0.4 g/dL — ABNORMAL HIGH (ref 0.2–0.3)
Alpha-2-Globulin: 1 g/dL — ABNORMAL HIGH (ref 0.5–0.9)
BETA 2: 0.6 g/dL — AB (ref 0.2–0.5)
Beta Globulin: 0.6 g/dL (ref 0.4–0.6)
GAMMA GLOBULIN: 1.5 g/dL (ref 0.8–1.7)
TOTAL PROTEIN, SERUM ELECTROPHOR: 7.9 g/dL (ref 6.1–8.1)

## 2016-06-02 LAB — IBC PANEL
IRON: 17 ug/dL — AB (ref 42–145)
Saturation Ratios: 4 % — ABNORMAL LOW (ref 20.0–50.0)
Transferrin: 303 mg/dL (ref 212.0–360.0)

## 2016-07-02 ENCOUNTER — Other Ambulatory Visit: Payer: Self-pay | Admitting: Internal Medicine

## 2016-07-14 ENCOUNTER — Telehealth: Payer: Self-pay

## 2016-07-14 MED ORDER — FUROSEMIDE 20 MG PO TABS: 20.0000 mg | ORAL_TABLET | Freq: Every morning | ORAL | 0 refills | Status: DC

## 2016-07-14 MED ORDER — LOVASTATIN 40 MG PO TABS: 40.0000 mg | ORAL_TABLET | Freq: Every day | ORAL | 0 refills | Status: DC

## 2016-07-14 NOTE — Telephone Encounter (Signed)
Requested Prescriptions   Signed Prescriptions Disp Refills  . furosemide (LASIX) 20 MG tablet 90 tablet 0    Sig: Take 1 tablet (20 mg total) by mouth every morning.    Authorizing Provider: Biagio Borg    Ordering User: Juliet Rude  . lovastatin (MEVACOR) 40 MG tablet 90 tablet 0    Sig: Take 1 tablet (40 mg total) by mouth at bedtime.    Authorizing Provider: Biagio Borg    Ordering User: Juliet Rude    Last OV 05/31/2016

## 2016-07-17 ENCOUNTER — Telehealth: Payer: Self-pay | Admitting: *Deleted

## 2016-07-17 NOTE — Telephone Encounter (Signed)
Rec'd call pt wanting to make sure she is taking current BP medication. Went over medication list w/pt, and updated list took off the duplicates she was questioning....Wendy Torres

## 2016-07-22 ENCOUNTER — Other Ambulatory Visit: Payer: Self-pay | Admitting: Internal Medicine

## 2016-08-07 ENCOUNTER — Telehealth: Payer: Self-pay | Admitting: Internal Medicine

## 2016-08-07 NOTE — Telephone Encounter (Signed)
Pharmacists caled wanting to know if pt can take Lotrel instead of both benazepril (LOTENSIN) 40 MG tablet and amLODipine (NORVASC) 10 MG, Lotrel is both of these combined. Please advise.

## 2016-08-08 MED ORDER — AMLODIPINE BESY-BENAZEPRIL HCL 10-40 MG PO CAPS: 1.0000 | ORAL_CAPSULE | Freq: Every day | ORAL | 3 refills | Status: DC

## 2016-08-08 NOTE — Telephone Encounter (Signed)
Ok for this -- done erx  Please let pt know as well of the change, thanks

## 2016-08-08 NOTE — Telephone Encounter (Signed)
Pt notifed.

## 2016-08-16 ENCOUNTER — Other Ambulatory Visit: Payer: Self-pay | Admitting: Internal Medicine

## 2016-08-20 ENCOUNTER — Other Ambulatory Visit: Payer: Self-pay | Admitting: Internal Medicine

## 2016-09-13 ENCOUNTER — Encounter (HOSPITAL_COMMUNITY): Payer: Self-pay | Admitting: Family Medicine

## 2016-09-13 ENCOUNTER — Ambulatory Visit (HOSPITAL_COMMUNITY)
Admission: EM | Admit: 2016-09-13 | Discharge: 2016-09-13 | Disposition: A | Discharge status: Home / Self Care | Payer: Medicare Other | Attending: Family Medicine | Admitting: Family Medicine

## 2016-09-13 ENCOUNTER — Telehealth: Payer: Self-pay | Admitting: Internal Medicine

## 2016-09-13 DIAGNOSIS — R0602 Shortness of breath: Secondary | ICD-10-CM

## 2016-09-13 DIAGNOSIS — J441 Chronic obstructive pulmonary disease with (acute) exacerbation: Secondary | ICD-10-CM

## 2016-09-13 MED ORDER — IPRATROPIUM-ALBUTEROL 0.5-2.5 (3) MG/3ML IN SOLN
RESPIRATORY_TRACT | Status: AC
  Filled 2016-09-13: qty 3

## 2016-09-13 MED ORDER — IPRATROPIUM-ALBUTEROL 0.5-2.5 (3) MG/3ML IN SOLN
3.0000 mL | Freq: Once | RESPIRATORY_TRACT | Status: AC
  Administered 2016-09-13: 3 mL via RESPIRATORY_TRACT

## 2016-09-13 MED ORDER — PREDNISONE 10 MG PO TABS: ORAL_TABLET | ORAL | 0 refills | Status: DC

## 2016-09-13 NOTE — Telephone Encounter (Signed)
Dr. Jenny Reichmann you were in the room with patients but I called her daughter Hassan Rowan back and suggested that she take her mother to urgent care or to the emergency room if she is still having difficulty breathing after the use of inhaler. I believe that was the appropriate response without disturbing you being in patient care.

## 2016-09-13 NOTE — ED Provider Notes (Signed)
CSN: [id number]     Arrival date & time 09/13/16  1051 History   First MD Initiated Contact with Patient 09/13/16 1241     Chief Complaint  Patient presents with  . Cough  . Shortness of Breath   (Consider location/radiation/quality/duration/timing/severity/associated sxs/prior Treatment) 76 year old female presents with non-productive cough and shortness of breath that has become worse over the past week. She denies any fever, nasal congestion, chest pain or GI symptoms. She has a history of allergic rhinitis and the high pollen count has been worsening her symptoms. She also has a history of COPD and asthma- was just started on a Albuterol in January 2018 during an exacerbation. Was also given IM and oral Prednisone which did help. Has been using her Albuterol inhaler 2 to 3 times a day in the past week as well as OTC cough medication with minimal success. She has multiple chronic health issues including HTN, GERD, arthritis, insomnia, constipation and anxiety and takes over 15 medications daily.    The history is provided by the patient and a caregiver.    Past Medical History:  Diagnosis Date  . ABNORMAL CHEST XRAY 11/05/2008  . ACUTE URIS OF UNSPECIFIED SITE 10/22/2007  . ALLERGIC RHINITIS 12/09/2006  . ANEMIA 01/14/2009  . Anxiety   . Arthritis   . ASTHMA 12/09/2006  . Blood transfusion without reported diagnosis   . Cataract    bilateral -removed  . Chronic pain syndrome 02/16/2009  . COPD 12/09/2006  . DEGENERATIVE JOINT DISEASE 12/09/2006  . DIZZINESS 03/30/2010  . DYSPNEA 01/27/2010   with heavy exertion  . FREQUENCY, URINARY 03/30/2010  . GERD 12/09/2006  . Glossitis 01/14/2009  . HELICOBACTER PYLORI GASTRITIS, HX OF 12/09/2006  . HIATAL HERNIA 03/01/2010  . HYPERLIPIDEMIA 04/18/2007  . HYPERSOMNIA 08/31/2008  . HYPERTENSION 12/09/2006  . INSOMNIA-SLEEP DISORDER-UNSPEC 11/05/2008  . LOW BACK PAIN 12/09/2006  . Morbid obesity ([place]) 12/09/2006  . Nonspecific (abnormal) findings on  radiological and other examination of body structure 11/05/2008  . OSTEOPOROSIS 12/09/2006  . OVERACTIVE BLADDER 04/14/2008  . PALPITATIONS, RECURRENT 01/27/2010  . Stroke [facility])    pt. stated light one years ago  . SYNCOPE 11/05/2008  . TRANSIENT ISCHEMIC ATTACK, HX OF 12/09/2006   Past Surgical History:  Procedure Laterality Date  . COLONOSCOPY    . LUMBAR DISC SURGERY    . TONSILLECTOMY     as a child  . TOTAL HIP ARTHROPLASTY Right 11/06/2012   Procedure: RIGHT TOTAL HIP ARTHROPLASTY WITH ACETABULAR AUTOGRAFT;  Surgeon: Gearlean Alf, MD;  Location: [facility];  Service: Orthopedics;  Laterality: Right;  . TOTAL HIP ARTHROPLASTY Left 02/18/2014   Procedure: LEFT TOTAL HIP ARTHROPLASTY;  Surgeon: Gearlean Alf, MD;  Location: [facility];  Service: Orthopedics;  Laterality: Left;  . TUBAL LIGATION     Family History  Problem Relation Age of Onset  . Heart disease Mother        Had pacemaker  . Lung cancer Father   . Hypertension Brother   . Diabetes Neg Hx   . Colon cancer Neg Hx   . Colon polyps Neg Hx   . Kidney disease Neg Hx   . Gallbladder disease Neg Hx   . Esophageal cancer Neg Hx    Social History  Substance Use Topics  . Smoking status: Former Smoker    Packs/day: 0.50    Years: 25.00    Types: Cigarettes    Quit date: 10/29/1972  . Smokeless tobacco: Never Used  .  Alcohol use No   OB History    No data available     Review of Systems  Constitutional: Positive for fatigue. Negative for activity change, appetite change, chills, diaphoresis and fever.  HENT: Positive for congestion (chest). Negative for ear pain, postnasal drip, rhinorrhea, sinus pain, sinus pressure, sore throat and trouble swallowing.   Eyes: Negative for discharge and visual disturbance.  Respiratory: Positive for cough, chest tightness, shortness of breath and wheezing.   Cardiovascular: Negative for chest pain, palpitations and leg swelling.  Gastrointestinal: Negative for abdominal pain, diarrhea,  nausea and vomiting.  Musculoskeletal: Positive for arthralgias and back pain (chronic). Negative for joint swelling, neck pain and neck stiffness.  Skin: Negative for rash and wound.  Neurological: Negative for dizziness, syncope, numbness and headaches.  Hematological: Negative for adenopathy.    Allergies  Patient has no known allergies.  Home Medications   Prior to Admission medications   Medication Sig Start Date End Date Taking? Authorizing Provider  acetaminophen (TYLENOL) 325 MG tablet Take 2 tablets (650 mg total) by mouth every 6 (six) hours as needed for mild pain (or Fever >/= 101). 02/19/14   Perkins, Alexzandrew L, PA-C  albuterol (PROVENTIL HFA;VENTOLIN HFA) 108 (90 Base) MCG/ACT inhaler Inhale 2 puffs into the lungs every 6 (six) hours as needed for wheezing or shortness of breath. 05/31/16   Biagio Borg, MD  ALPRAZolam Duanne Moron) 0.25 MG tablet TAKE 1 TABLET TWICE DAILY AS NEEDED FOR ANXIETY 04/04/16   Biagio Borg, MD  amLODipine-benazepril (LOTREL) 10-40 MG capsule Take 1 capsule by mouth daily. 08/08/16   Biagio Borg, MD  bisacodyl (DULCOLAX) 10 MG suppository Place 1 suppository (10 mg total) rectally daily as needed for moderate constipation. 02/19/14   Perkins, Alexzandrew L, PA-C  cetirizine (ZYRTEC) 10 MG tablet Take 1 tablet (10 mg total) by mouth daily. 08/06/15   Biagio Borg, MD  cloNIDine (CATAPRES) 0.3 MG tablet TAKE 1 TABLET BY MOUTH TWO  TIMES DAILY 07/24/16   Biagio Borg, MD  docusate sodium 100 MG CAPS Take 100 mg by mouth 2 (two) times daily. 02/19/14   Perkins, Alexzandrew L, PA-C  furosemide (LASIX) 20 MG tablet Take 1 tablet (20 mg total) by mouth every morning. 07/14/16   Biagio Borg, MD  gabapentin (NEURONTIN) 300 MG capsule TAKE 1 CAPSULE BY MOUTH 3  TIMES DAILY 07/24/16   Biagio Borg, MD  hydrALAZINE (APRESOLINE) 25 MG tablet TAKE 1 TABLET BY MOUTH 3  TIMES DAILY 08/21/16   Biagio Borg, MD  hydrocortisone-pramoxine [facility]) 2.5-1 % rectal  cream Apply rectally twice daily. 10/19/14   Hvozdovic, Lori P, PA-C  irbesartan (AVAPRO) 300 MG tablet Take 1 tablet (300 mg total) by mouth daily. 12/22/14   Biagio Borg, MD  lovastatin (MEVACOR) 40 MG tablet Take 1 tablet (40 mg total) by mouth at bedtime. 07/14/16   Biagio Borg, MD  nystatin (MYCOSTATIN) 100000 UNIT/ML suspension TAKE 1 TEASPOONFUL IN THE MOUTH OR THROAT 4 TIMES A DAY AS NEEDED 08/27/15   Biagio Borg, MD  ondansetron (ZOFRAN) 4 MG tablet Take 1 tablet (4 mg total) by mouth every 6 (six) hours as needed for nausea. 02/19/14   Perkins, Alexzandrew L, PA-C  oxybutynin (DITROPAN) 5 MG tablet TAKE 1 TABLET BY MOUTH 3  TIMES DAILY 07/03/16   Biagio Borg, MD  pantoprazole (PROTONIX) 40 MG tablet TAKE 1 TABLET BY MOUTH  DAILY 07/03/16   Biagio Borg,  MD  Polyethyl Glycol-Propyl Glycol (SYSTANE OP) Place 1 drop into both eyes. Three to four times daily.    [provider]  polyethylene glycol (MIRALAX / GLYCOLAX) packet Take 17 g by mouth daily as needed for mild constipation. 02/19/14   Perkins, Alexzandrew L, PA-C  predniSONE (DELTASONE) 10 MG tablet Take 4 tablets (40mg ) by mouth daily for first 2 days, then 3 tablets for 2 days, then 2 tablets for 2 days then 1 tablet until finished. 09/13/16   Katy Apo, NP  tiZANidine (ZANAFLEX) 4 MG tablet TAKE 1 TABLET (4 MG TOTAL) BY MOUTH EVERY 6 (SIX) HOURS AS NEEDED FOR MUSCLE SPASMS. 04/28/16   Biagio Borg, MD  traMADol (ULTRAM) 50 MG tablet TAKE 1 TO 2 TABLETS BY MOUTH EVERY 6 HOURS AS NEEDED FOR SEVERE PAIN 05/31/16   Biagio Borg, MD  traZODone (DESYREL) 50 MG tablet TAKE 1/2 TO 1 TABLET BY MOUTH AT BEDTIME AS NEEDED FOR SLEEP 02/07/16   Biagio Borg, MD   Meds Ordered and Administered this Visit   Medications  ipratropium-albuterol (DUONEB) 0.5-2.5 (3) MG/3ML nebulizer solution 3 mL (3 mLs Nebulization Given 09/13/16 1300)    BP (!) 165/85   Pulse 82   Temp 98.4 F (36.9 C) (Oral)   Resp 18   SpO2 95%  No data  found.   Physical Exam  Constitutional: She is oriented to person, place, and time. She appears well-developed and well-nourished. She does not appear ill. No distress.  Patient resting comfortably in wheelchair in no distress.   HENT:  Head: Normocephalic and atraumatic.  Right Ear: Hearing, tympanic membrane, external ear and ear canal normal.  Left Ear: Hearing, tympanic membrane, external ear and ear canal normal.  Nose: Mucosal edema present. Right sinus exhibits maxillary sinus tenderness and frontal sinus tenderness. Left sinus exhibits no maxillary sinus tenderness and no frontal sinus tenderness.  Mouth/Throat: Uvula is midline, oropharynx is clear and moist and mucous membranes are normal.  Neck: Normal range of motion. Neck supple.  Cardiovascular: Normal rate.  An irregular rhythm present.  No murmur heard. Pulmonary/Chest: Effort normal. No accessory muscle usage. No respiratory distress. She has decreased breath sounds in the right upper field, the right middle field, the right lower field, the left upper field and the left lower field. She has wheezes in the right upper field and the left upper field. She has no rhonchi. She has no rales.  Lymphadenopathy:    She has no cervical adenopathy.  Neurological: She is alert and oriented to person, place, and time.  Skin: Skin is warm and dry. Capillary refill takes less than 2 seconds.  Psychiatric: She has a normal mood and affect. Her behavior is normal. Judgment and thought content normal.    Urgent Care Course     Procedures (including critical care time)  Labs Review Labs Reviewed - No data to display  Imaging Review No results found.   Visual Acuity Review  Right Eye Distance:   Left Eye Distance:   Bilateral Distance:    Right Eye Near:   Left Eye Near:    Bilateral Near:         MDM   1. COPD exacerbation ([place])   2. Shortness of breath    Gave DuoNeb treatment now. Patient indicated that she  feels better and can breathe easier after treatment. Rechecked pulse Ox which was 96%. Improved breath sounds with still slight wheezing in upper lobes. No rhonchi or rales  heard. Discussed with patient and caregiver that since she does not have a fever, chest pain, or productive cough, will forego chest x-ray today. Recommend start Prednisone 40mg  (4 tablets daily) for 1st 2 days then decrease by 1 tablet every 2 days until finished. Continue to use Albuterol 2 puffs every 6 hours as needed. Monitor symptoms and if any increase in shortness of breath, wheezing or any chest pain occurs, go to ER. Otherwise, follow-up with your primary care provider in 2 days for recheck.    Katy Apo, NP 09/13/16 2015

## 2016-09-13 NOTE — Telephone Encounter (Signed)
Sounds good, thanks.

## 2016-09-13 NOTE — Discharge Instructions (Addendum)
Recommend start Prednisone 40mg  (4 tablets daily) for 1st 2 days then decrease by 1 tablet every 2 days until finished. Continue to use Albuterol 2 puffs every 6 hours as needed. Monitor symptoms and if any increase in shortness of breath, wheezing or any chest pain occurs, go to ER. Otherwise, follow-up with your primary care provider in 2 days for recheck.

## 2016-09-13 NOTE — ED Triage Notes (Signed)
Pt here for cough, SOB for a few days. sts using cough medication and inhaler without relief.

## 2016-09-13 NOTE — Telephone Encounter (Signed)
Pts daughter called and said that the pt is having a hard time breathing and has shortness of breath She has tried her inhaler and it helped a little bit but not a lot. She did not know if there was something else she could do or if she would need to be seen. The office is full today. Please advise.

## 2016-09-26 ENCOUNTER — Encounter: Payer: Self-pay | Admitting: Internal Medicine

## 2016-09-26 ENCOUNTER — Ambulatory Visit (INDEPENDENT_AMBULATORY_CARE_PROVIDER_SITE_OTHER)
Admission: RE | Admit: 2016-09-26 | Discharge: 2016-09-26 | Disposition: A | Discharge status: Home / Self Care | Payer: Medicare Other | Source: Ambulatory Visit | Attending: Internal Medicine | Admitting: Internal Medicine

## 2016-09-26 ENCOUNTER — Other Ambulatory Visit (INDEPENDENT_AMBULATORY_CARE_PROVIDER_SITE_OTHER): Payer: Medicare Other

## 2016-09-26 ENCOUNTER — Ambulatory Visit (INDEPENDENT_AMBULATORY_CARE_PROVIDER_SITE_OTHER): Payer: Medicare Other | Admitting: Internal Medicine

## 2016-09-26 VITALS — BP 170/98 | HR 91 | Ht 64.0 in | Wt 215.0 lb

## 2016-09-26 DIAGNOSIS — D509 Iron deficiency anemia, unspecified: Secondary | ICD-10-CM

## 2016-09-26 DIAGNOSIS — Z0000 Encounter for general adult medical examination without abnormal findings: Secondary | ICD-10-CM | POA: Diagnosis not present

## 2016-09-26 DIAGNOSIS — J441 Chronic obstructive pulmonary disease with (acute) exacerbation: Secondary | ICD-10-CM | POA: Diagnosis not present

## 2016-09-26 DIAGNOSIS — I10 Essential (primary) hypertension: Secondary | ICD-10-CM | POA: Diagnosis not present

## 2016-09-26 DIAGNOSIS — R0602 Shortness of breath: Secondary | ICD-10-CM | POA: Diagnosis not present

## 2016-09-26 DIAGNOSIS — R05 Cough: Secondary | ICD-10-CM | POA: Diagnosis not present

## 2016-09-26 LAB — CBC WITH DIFFERENTIAL/PLATELET
BASOS ABS: 0.1 10*3/uL (ref 0.0–0.1)
Basophils Relative: 1.2 % (ref 0.0–3.0)
EOS PCT: 4.9 % (ref 0.0–5.0)
Eosinophils Absolute: 0.5 10*3/uL (ref 0.0–0.7)
HCT: 32.9 % — ABNORMAL LOW (ref 36.0–46.0)
Hemoglobin: 10.5 g/dL — ABNORMAL LOW (ref 12.0–15.0)
LYMPHS ABS: 3.4 10*3/uL (ref 0.7–4.0)
Lymphocytes Relative: 31.6 % (ref 12.0–46.0)
MCHC: 31.8 g/dL (ref 30.0–36.0)
MCV: 76.6 fl — ABNORMAL LOW (ref 78.0–100.0)
MONO ABS: 1.3 10*3/uL — AB (ref 0.1–1.0)
Monocytes Relative: 11.6 % (ref 3.0–12.0)
NEUTROS PCT: 50.7 % (ref 43.0–77.0)
Neutro Abs: 5.5 10*3/uL (ref 1.4–7.7)
Platelets: 405 10*3/uL — ABNORMAL HIGH (ref 150.0–400.0)
RBC: 4.29 Mil/uL (ref 3.87–5.11)
RDW: 15.8 % — ABNORMAL HIGH (ref 11.5–15.5)
WBC: 10.9 10*3/uL — ABNORMAL HIGH (ref 4.0–10.5)

## 2016-09-26 LAB — FERRITIN: FERRITIN: 10.4 ng/mL (ref 10.0–291.0)

## 2016-09-26 LAB — IBC PANEL
IRON: 22 ug/dL — AB (ref 42–145)
SATURATION RATIOS: 4.8 % — AB (ref 20.0–50.0)
TRANSFERRIN: 325 mg/dL (ref 212.0–360.0)

## 2016-09-26 MED ORDER — HYDRALAZINE HCL 25 MG PO TABS: 25.0000 mg | ORAL_TABLET | Freq: Three times a day (TID) | ORAL | 3 refills | Status: DC

## 2016-09-26 MED ORDER — PREDNISONE 10 MG PO TABS: ORAL_TABLET | ORAL | 0 refills | Status: DC

## 2016-09-26 MED ORDER — BUDESONIDE-FORMOTEROL FUMARATE 160-4.5 MCG/ACT IN AERO: 2.0000 | INHALATION_SPRAY | Freq: Two times a day (BID) | RESPIRATORY_TRACT | 3 refills | Status: DC

## 2016-09-26 MED ORDER — BUDESONIDE-FORMOTEROL FUMARATE 160-4.5 MCG/ACT IN AERO: 2.0000 | INHALATION_SPRAY | Freq: Two times a day (BID) | RESPIRATORY_TRACT | 11 refills | Status: DC

## 2016-09-26 MED ORDER — METHYLPREDNISOLONE ACETATE 80 MG/ML IJ SUSP
80.0000 mg | Freq: Once | INTRAMUSCULAR | Status: AC
  Administered 2016-09-26: 80 mg via INTRAMUSCULAR

## 2016-09-26 NOTE — Progress Notes (Signed)
Subjective:    Patient ID: Wendy Torres, female    DOB: 03-Nov-1940, 76 y.o.   MRN: [id number]  HPI   Here to f/u recent UC visit with COPD exacerbation tx with neb in the office and prednisone for home; pt did feel improved for a short time, but now in past few days worse again with incresaed tightness, sob/doe without prod cough, CP, fever or overt wheezing.  Pt denies orthopnea, PND, increased LE swelling, palpitations, dizziness or syncope.   Pt denies fever, wt loss, night sweats, loss of appetite, or other constitutional symptoms  No overt bleeding, has hx of iron def anemia, and non compliance with iron OTC.  BP remains elevated here as well as at home per pt - just wont seem to come down, and not really worse with her current illness. Pt denies new neurological symptoms such as new headache, or facial or extremity weakness or numbness   Pt denies polydipsia, polyuria, Past Medical History:  Diagnosis Date  . ABNORMAL CHEST XRAY 11/05/2008  . ACUTE URIS OF UNSPECIFIED SITE 10/22/2007  . ALLERGIC RHINITIS 12/09/2006  . ANEMIA 01/14/2009  . Anxiety   . Arthritis   . ASTHMA 12/09/2006  . Blood transfusion without reported diagnosis   . Cataract    bilateral -removed  . Chronic pain syndrome 02/16/2009  . COPD 12/09/2006  . DEGENERATIVE JOINT DISEASE 12/09/2006  . DIZZINESS 03/30/2010  . DYSPNEA 01/27/2010   with heavy exertion  . FREQUENCY, URINARY 03/30/2010  . GERD 12/09/2006  . Glossitis 01/14/2009  . HELICOBACTER PYLORI GASTRITIS, HX OF 12/09/2006  . HIATAL HERNIA 03/01/2010  . HYPERLIPIDEMIA 04/18/2007  . HYPERSOMNIA 08/31/2008  . HYPERTENSION 12/09/2006  . INSOMNIA-SLEEP DISORDER-UNSPEC 11/05/2008  . LOW BACK PAIN 12/09/2006  . Morbid obesity ([place]) 12/09/2006  . Nonspecific (abnormal) findings on radiological and other examination of body structure 11/05/2008  . OSTEOPOROSIS 12/09/2006  . OVERACTIVE BLADDER 04/14/2008  . PALPITATIONS, RECURRENT 01/27/2010  . Stroke [facility])    pt. stated  light one years ago  . SYNCOPE 11/05/2008  . TRANSIENT ISCHEMIC ATTACK, HX OF 12/09/2006   Past Surgical History:  Procedure Laterality Date  . COLONOSCOPY    . LUMBAR DISC SURGERY    . TONSILLECTOMY     as a child  . TOTAL HIP ARTHROPLASTY Right 11/06/2012   Procedure: RIGHT TOTAL HIP ARTHROPLASTY WITH ACETABULAR AUTOGRAFT;  Surgeon: Gearlean Alf, MD;  Location: [facility];  Service: Orthopedics;  Laterality: Right;  . TOTAL HIP ARTHROPLASTY Left 02/18/2014   Procedure: LEFT TOTAL HIP ARTHROPLASTY;  Surgeon: Gearlean Alf, MD;  Location: [facility];  Service: Orthopedics;  Laterality: Left;  . TUBAL LIGATION      reports that she quit smoking about 43 years ago. Her smoking use included Cigarettes. She has a 12.50 pack-year smoking history. She has never used smokeless tobacco. She reports that she does not drink alcohol or use drugs. family history includes Heart disease in her mother; Hypertension in her brother; Lung cancer in her father. No Known Allergies Current Outpatient Prescriptions on File Prior to Visit  Medication Sig Dispense Refill  . acetaminophen (TYLENOL) 325 MG tablet Take 2 tablets (650 mg total) by mouth every 6 (six) hours as needed for mild pain (or Fever >/= 101). 40 tablet 0  . albuterol (PROVENTIL HFA;VENTOLIN HFA) 108 (90 Base) MCG/ACT inhaler Inhale 2 puffs into the lungs every 6 (six) hours as needed for wheezing or shortness of breath. 1 Inhaler 11  .  ALPRAZolam (XANAX) 0.25 MG tablet TAKE 1 TABLET TWICE DAILY AS NEEDED FOR ANXIETY 60 tablet 2  . amLODipine-benazepril (LOTREL) 10-40 MG capsule Take 1 capsule by mouth daily. 90 capsule 3  . bisacodyl (DULCOLAX) 10 MG suppository Place 1 suppository (10 mg total) rectally daily as needed for moderate constipation. 12 suppository 0  . cetirizine (ZYRTEC) 10 MG tablet Take 1 tablet (10 mg total) by mouth daily. 30 tablet 11  . cloNIDine (CATAPRES) 0.3 MG tablet TAKE 1 TABLET BY MOUTH TWO  TIMES DAILY 180 tablet 3  .  docusate sodium 100 MG CAPS Take 100 mg by mouth 2 (two) times daily. 10 capsule 0  . furosemide (LASIX) 20 MG tablet Take 1 tablet (20 mg total) by mouth every morning. 90 tablet 0  . gabapentin (NEURONTIN) 300 MG capsule TAKE 1 CAPSULE BY MOUTH 3  TIMES DAILY 270 capsule 3  . hydrALAZINE (APRESOLINE) 25 MG tablet TAKE 1 TABLET BY MOUTH 3  TIMES DAILY 270 tablet 2  . hydrocortisone-pramoxine (ANALPRAM HC) 2.5-1 % rectal cream Apply rectally twice daily. 30 g 1  . irbesartan (AVAPRO) 300 MG tablet Take 1 tablet (300 mg total) by mouth daily. 90 tablet 3  . lovastatin (MEVACOR) 40 MG tablet Take 1 tablet (40 mg total) by mouth at bedtime. 90 tablet 0  . nystatin (MYCOSTATIN) 100000 UNIT/ML suspension TAKE 1 TEASPOONFUL IN THE MOUTH OR THROAT 4 TIMES A DAY AS NEEDED 60 mL 1  . oxybutynin (DITROPAN) 5 MG tablet TAKE 1 TABLET BY MOUTH 3  TIMES DAILY 270 tablet 2  . pantoprazole (PROTONIX) 40 MG tablet TAKE 1 TABLET BY MOUTH  DAILY 90 tablet 2  . Polyethyl Glycol-Propyl Glycol (SYSTANE OP) Place 1 drop into both eyes. Three to four times daily.    . polyethylene glycol (MIRALAX / GLYCOLAX) packet Take 17 g by mouth daily as needed for mild constipation. 14 each 0  . tiZANidine (ZANAFLEX) 4 MG tablet TAKE 1 TABLET (4 MG TOTAL) BY MOUTH EVERY 6 (SIX) HOURS AS NEEDED FOR MUSCLE SPASMS. 50 tablet 1  . traMADol (ULTRAM) 50 MG tablet TAKE 1 TO 2 TABLETS BY MOUTH EVERY 6 HOURS AS NEEDED FOR SEVERE PAIN 60 tablet 5  . traZODone (DESYREL) 50 MG tablet TAKE 1/2 TO 1 TABLET BY MOUTH AT BEDTIME AS NEEDED FOR SLEEP 60 tablet 5  . [DISCONTINUED] lansoprazole (PREVACID) 30 MG capsule Take 1 capsule (30 mg total) by mouth daily. 90 capsule 3   Current Facility-Administered Medications on File Prior to Visit  Medication Dose Route Frequency Provider Last Rate Last Dose  . tranexamic acid (CYKLOKAPRON) 2,000 mg in sodium chloride 0.9 % 50 mL Topical Application  2,951 mg Topical Once Constable, [employer], PA-C        Review of Systems  Constitutional: Negative for other unusual diaphoresis or sweats HENT: Negative for ear discharge or swelling Eyes: Negative for other worsening visual disturbances Respiratory: Negative for stridor or other swelling  Gastrointestinal: Negative for worsening distension or other blood Genitourinary: Negative for retention or other urinary change Musculoskeletal: Negative for other MSK pain or swelling Skin: Negative for color change or other new lesions Neurological: Negative for worsening tremors and other numbness  Psychiatric/Behavioral: Negative for worsening agitation or other fatigue All other system neg per pt    Objective:   Physical Exam BP (!) 170/98   Pulse 91   Ht 5\' 4"  (1.626 m)   Wt 215 lb (97.5 kg)   SpO2 97%  BMI 36.90 kg/m  VS noted,  Constitutional: Pt appears in NAD HENT: Head: NCAT.  Right Ear: External ear normal.  Left Ear: External ear normal.  Eyes: . Pupils are equal, round, and reactive to light. Conjunctivae and EOM are normal Nose: without d/c or deformity Neck: Neck supple. Gross normal ROM Cardiovascular: Normal rate and regular rhythm.   Pulmonary/Chest: Effort normal and breath sounds marked decreased without rales or wheezing.  Abd:  Soft, NT, ND, + BS, no organomegaly Neurological: Pt is alert. At baseline orientation, motor grossly intact Skin: Skin is warm. No rashes, other new lesions, no LE edema Psychiatric: Pt behavior is normal without agitation   IBC panel  Order: [id number]  Status:  Final result Visible to patient:  No (Not Released) Next appt:  None Dx:  Relative anemia    Ref Range & Units 77mo ago (06/02/16) 29yr ago (08/03/14) 76yr ago (04/30/14) 48yr ago (08/12/10) 17yr ago (03/01/10)   Iron 42 - 145 ug/dL 17   39   17   34   35R     Transferrin 212.0 - 360.0 mg/dL 303.0   307.0  226.6  269.0    Saturation Ratios 20.0 - 50.0 % 4.0    4.0   10.7   9.3          Lab Results  Component Value Date   WBC  7.2 05/31/2016   HGB 11.0 (L) 05/31/2016   HCT 33.6 (L) 05/31/2016   PLT 336.0 05/31/2016   GLUCOSE 90 05/31/2016   CHOL 162 05/31/2016   TRIG 90.0 05/31/2016   HDL 55.90 05/31/2016   LDLCALC 88 05/31/2016   ALT 10 05/31/2016   AST 16 05/31/2016   NA 138 05/31/2016   K 3.9 05/31/2016   CL 104 05/31/2016   CREATININE 1.06 05/31/2016   BUN 12 05/31/2016   CO2 28 05/31/2016   TSH 0.76 05/31/2016   INR 1.02 02/10/2014        Assessment & Plan:

## 2016-09-26 NOTE — Patient Instructions (Addendum)
You had the steroid shot today  Please take all new medication as prescribed - the prednisone (sent to local pharmacy - [facility])  Please take all new medication as prescribed - the Symbicort and hydralazine for blood pressure ([facility]  Please continue all other medications as before, and refills have been done if requested.  Please have the pharmacy call with any other refills you may need.  Please keep your appointments with your specialists as you may have planned  Please go to the XRAY Department in the Basement (go straight as you get off the elevator) for the x-ray testing  Please go to the LAB in the Basement (turn left off the elevator) for the tests to be done today  \You will be contacted by phone if any changes need to be made immediately.  Otherwise, you will receive a letter about your results with an explanation, but please check with MyChart first.  Please return in 7 months, or sooner if needed, with Lab testing done 3-5 days before

## 2016-09-26 NOTE — Assessment & Plan Note (Signed)
Mild to mod persitent, for depomedrol IM 80, predpac asd, cont albut prn, also add symbicort asd,  to f/u any worsening symptoms or concerns

## 2016-09-26 NOTE — Assessment & Plan Note (Signed)
For f/u iron labs today

## 2016-09-26 NOTE — Assessment & Plan Note (Signed)
Uncontrolled, pt states taking all meds, to add hydralazine 25 tid

## 2016-09-26 NOTE — Addendum Note (Signed)
Addended by: Biagio Borg on: 09/26/2016 08:37 PM   Modules accepted: Orders

## 2016-09-27 ENCOUNTER — Telehealth: Payer: Self-pay

## 2016-09-27 NOTE — Telephone Encounter (Signed)
-----   Message from Biagio Borg, MD sent at 09/26/2016  8:21 PM EDT ----- Left message on MyChart, pt to cont same tx except  The test results show that your current treatment is OK, except the xray shows possible fluid building up in the lungs.  Please increase the lasix to 20 mg twice per day for 5 days, then back to once in the AM after that.  Please make return office visit in 1 week.    Wendy Torres to please inform pt, and help make ROV in 1 week

## 2016-09-27 NOTE — Telephone Encounter (Signed)
Pt has been informed results and expressed understanding, she stated that her daughter brings her to appts so she will have to talk with her to see what a good time would work for a follow up next week.

## 2016-09-28 ENCOUNTER — Encounter: Payer: Self-pay | Admitting: Internal Medicine

## 2016-09-28 ENCOUNTER — Other Ambulatory Visit: Payer: Self-pay | Admitting: Internal Medicine

## 2016-09-28 ENCOUNTER — Telehealth: Payer: Self-pay

## 2016-09-28 DIAGNOSIS — D509 Iron deficiency anemia, unspecified: Secondary | ICD-10-CM

## 2016-09-28 NOTE — Telephone Encounter (Signed)
-----   Message from Biagio Borg, MD sent at 09/28/2016  1:08 PM EDT ----- Letter sent, cont same tx except  The test results show that your current treatment is OK, except the iron level is still low, and the Hemoglobin (red blood cells) are mildly worse.  Please take you iron supplement every day, and follow up with LAB only to do in 3 months.    Shirron to please inform pt, I will do lab orders

## 2016-09-28 NOTE — Telephone Encounter (Signed)
Called Hassan Rowan (daughter). LVM.

## 2016-09-28 NOTE — Telephone Encounter (Deleted)
-----   Message from Biagio Borg, MD sent at 09/28/2016  1:08 PM EDT ----- Letter sent, cont same tx except  The test results show that your current treatment is OK, except the iron level is still low, and the Hemoglobin (red blood cells) are mildly worse.  Please take you iron supplement every day, and follow up with LAB only to do in 3 months.    Wendy Torres to please inform pt, I will do lab orders

## 2016-09-29 NOTE — Telephone Encounter (Signed)
Daughter called back.  Gave MD response.

## 2016-09-30 ENCOUNTER — Other Ambulatory Visit: Payer: Self-pay | Admitting: Internal Medicine

## 2016-10-05 ENCOUNTER — Other Ambulatory Visit (INDEPENDENT_AMBULATORY_CARE_PROVIDER_SITE_OTHER): Payer: Medicare Other

## 2016-10-05 ENCOUNTER — Ambulatory Visit (INDEPENDENT_AMBULATORY_CARE_PROVIDER_SITE_OTHER): Payer: Medicare Other | Admitting: Internal Medicine

## 2016-10-05 DIAGNOSIS — I5033 Acute on chronic diastolic (congestive) heart failure: Secondary | ICD-10-CM

## 2016-10-05 DIAGNOSIS — I509 Heart failure, unspecified: Secondary | ICD-10-CM | POA: Insufficient documentation

## 2016-10-05 DIAGNOSIS — D509 Iron deficiency anemia, unspecified: Secondary | ICD-10-CM

## 2016-10-05 DIAGNOSIS — I10 Essential (primary) hypertension: Secondary | ICD-10-CM

## 2016-10-05 LAB — BASIC METABOLIC PANEL WITH GFR
BUN: 13 mg/dL (ref 6–23)
CO2: 30 meq/L (ref 19–32)
Calcium: 10.8 mg/dL — ABNORMAL HIGH (ref 8.4–10.5)
Chloride: 99 meq/L (ref 96–112)
Creatinine, Ser: 1.12 mg/dL (ref 0.40–1.20)
GFR: 60.56 mL/min
Glucose, Bld: 112 mg/dL — ABNORMAL HIGH (ref 70–99)
Potassium: 4.7 meq/L (ref 3.5–5.1)
Sodium: 136 meq/L (ref 135–145)

## 2016-10-05 LAB — FERRITIN: FERRITIN: 8.9 ng/mL — AB (ref 10.0–291.0)

## 2016-10-05 LAB — IBC PANEL
Iron: 112 ug/dL (ref 42–145)
Saturation Ratios: 28 % (ref 20.0–50.0)
Transferrin: 286 mg/dL (ref 212.0–360.0)

## 2016-10-05 LAB — BRAIN NATRIURETIC PEPTIDE: PRO B NATRI PEPTIDE: 18 pg/mL (ref 0.0–100.0)

## 2016-10-05 MED ORDER — BUDESONIDE-FORMOTEROL FUMARATE 160-4.5 MCG/ACT IN AERO: 2.0000 | INHALATION_SPRAY | Freq: Two times a day (BID) | RESPIRATORY_TRACT | 3 refills | Status: DC

## 2016-10-05 MED ORDER — HYDRALAZINE HCL 25 MG PO TABS: 25.0000 mg | ORAL_TABLET | Freq: Three times a day (TID) | ORAL | 3 refills | Status: DC

## 2016-10-05 NOTE — Assessment & Plan Note (Addendum)
Chronic persistent with mild worsening Hgb , suspect chronic slow GI blood loss, urged pt compliance with daily iron sulfate

## 2016-10-05 NOTE — Assessment & Plan Note (Signed)
Improved, ok to cont current meds as is, o/w stable overall by history and exam, recent data reviewed with pt, and pt to continue medical treatment as before,  to f/u any worsening symptoms or concerns BP Readings from Last 3 Encounters:  10/05/16 (!) 144/90  09/26/16 (!) 170/98  09/13/16 (!) 165/85

## 2016-10-05 NOTE — Patient Instructions (Signed)
OK to continue the lasix (fluid pill) at 20 mg in the AM, AND a second dose in the afternoon for worsening Shortness of breath, or wt gain up over 5 lbs  Please continue all other medications as before, and refills have been done if requested.  Please have the pharmacy call with any other refills you may need.  Please keep your appointments with your specialists as you may have planned  Please go to the LAB in the Basement (turn left off the elevator) for the tests to be done today  You will be contacted by phone if any changes need to be made immediately.  Otherwise, you will receive a letter about your results with an explanation, but please check with MyChart first.  Please remember to sign up for MyChart if you have not done so, as this will be important to you in the future with finding out test results, communicating by private email, and scheduling acute appointments online when needed.  Please return in 1 month, or sooner if needed

## 2016-10-05 NOTE — Assessment & Plan Note (Addendum)
C/w dCHF; ok to cont the lasix at 20 mg qam, with BID prn worsening sob or wt gain > 5 lbs, for BMP and BNP today

## 2016-10-05 NOTE — Progress Notes (Signed)
Subjective:    Patient ID: Wendy Torres, female    DOB: Oct 21, 1940, 76 y.o.   MRN: [id number]  HPI  Here to f/u acute on chronic diast CHF at last visit; has been taking the lasix bid, with effective diuresis and wt loss: Wt Readings from Last 3 Encounters:  10/05/16 208 lb (94.3 kg)  09/26/16 215 lb (97.5 kg)  05/31/16 206 lb (93.4 kg)  Pt denies chest pain, increased sob or doe, wheezing, orthopnea, PND, increased LE swelling, palpitations, dizziness or syncope.  Pt has no new complaints.   Pt denies fever, night sweats, loss of appetite, or other constitutional symptoms   Pt denies polydipsia, polyuria,   No overt bleeding Past Medical History:  Diagnosis Date  . ABNORMAL CHEST XRAY 11/05/2008  . ACUTE URIS OF UNSPECIFIED SITE 10/22/2007  . ALLERGIC RHINITIS 12/09/2006  . ANEMIA 01/14/2009  . Anxiety   . Arthritis   . ASTHMA 12/09/2006  . Blood transfusion without reported diagnosis   . Cataract    bilateral -removed  . Chronic pain syndrome 02/16/2009  . COPD 12/09/2006  . DEGENERATIVE JOINT DISEASE 12/09/2006  . DIZZINESS 03/30/2010  . DYSPNEA 01/27/2010   with heavy exertion  . FREQUENCY, URINARY 03/30/2010  . GERD 12/09/2006  . Glossitis 01/14/2009  . HELICOBACTER PYLORI GASTRITIS, HX OF 12/09/2006  . HIATAL HERNIA 03/01/2010  . HYPERLIPIDEMIA 04/18/2007  . HYPERSOMNIA 08/31/2008  . HYPERTENSION 12/09/2006  . INSOMNIA-SLEEP DISORDER-UNSPEC 11/05/2008  . LOW BACK PAIN 12/09/2006  . Morbid obesity ([place]) 12/09/2006  . Nonspecific (abnormal) findings on radiological and other examination of body structure 11/05/2008  . OSTEOPOROSIS 12/09/2006  . OVERACTIVE BLADDER 04/14/2008  . PALPITATIONS, RECURRENT 01/27/2010  . Stroke [facility])    pt. stated light one years ago  . SYNCOPE 11/05/2008  . TRANSIENT ISCHEMIC ATTACK, HX OF 12/09/2006   Past Surgical History:  Procedure Laterality Date  . COLONOSCOPY    . LUMBAR DISC SURGERY    . TONSILLECTOMY     as a child  . TOTAL HIP ARTHROPLASTY  Right 11/06/2012   Procedure: RIGHT TOTAL HIP ARTHROPLASTY WITH ACETABULAR AUTOGRAFT;  Surgeon: Gearlean Alf, MD;  Location: [facility];  Service: Orthopedics;  Laterality: Right;  . TOTAL HIP ARTHROPLASTY Left 02/18/2014   Procedure: LEFT TOTAL HIP ARTHROPLASTY;  Surgeon: Gearlean Alf, MD;  Location: [facility];  Service: Orthopedics;  Laterality: Left;  . TUBAL LIGATION      reports that she quit smoking about 43 years ago. Her smoking use included Cigarettes. She has a 12.50 pack-year smoking history. She has never used smokeless tobacco. She reports that she does not drink alcohol or use drugs. family history includes Heart disease in her mother; Hypertension in her brother; Lung cancer in her father. No Known Allergies  Review of Systems  Constitutional: Negative for other unusual diaphoresis or sweats HENT: Negative for ear discharge or swelling Eyes: Negative for other worsening visual disturbances Respiratory: Negative for stridor or other swelling  Gastrointestinal: Negative for worsening distension or other blood Genitourinary: Negative for retention or other urinary change Musculoskeletal: Negative for other MSK pain or swelling Skin: Negative for color change or other new lesions Neurological: Negative for worsening tremors and other numbness  Psychiatric/Behavioral: Negative for worsening agitation or other fatigue All other system newg per pt    Objective:   Physical Exam BP (!) 144/90   Pulse 88   Ht 5\' 4"  (1.626 m)   Wt 208 lb (94.3 kg)  SpO2 99%   BMI 35.70 kg/m  VS noted,  Constitutional: Pt appears in NAD HENT: Head: NCAT.  Right Ear: External ear normal.  Left Ear: External ear normal.  Eyes: . Pupils are equal, round, and reactive to light. Conjunctivae and EOM are normal Nose: without d/c or deformity Neck: Neck supple. Gross normal ROM Cardiovascular: Normal rate and regular rhythm.   Pulmonary/Chest: Effort normal and breath sounds decreased without rales  or wheezing.  Abd:  Soft, NT, ND, + BS, no organomegaly Neurological: Pt is alert. At baseline orientation, motor grossly intact Skin: Skin is warm. No rashes, other new lesions, no LE edema Psychiatric: Pt behavior is normal without agitation  No other exam findings Lab Results  Component Value Date   WBC 10.9 (H) 09/26/2016   HGB 10.5 (L) 09/26/2016   HCT 32.9 (L) 09/26/2016   PLT 405.0 (H) 09/26/2016   GLUCOSE 90 05/31/2016   CHOL 162 05/31/2016   TRIG 90.0 05/31/2016   HDL 55.90 05/31/2016   LDLCALC 88 05/31/2016   ALT 10 05/31/2016   AST 16 05/31/2016   NA 138 05/31/2016   K 3.9 05/31/2016   CL 104 05/31/2016   CREATININE 1.06 05/31/2016   BUN 12 05/31/2016   CO2 28 05/31/2016   TSH 0.76 05/31/2016   INR 1.02 02/10/2014   Transthoracic Echocardiography - summary Study Date: 06/02/2015 Study Conclusions - Left ventricle: The cavity size was normal. Wall thickness was   increased in a pattern of moderate LVH. Systolic function was   normal. The estimated ejection fraction was in the range of 60%   to 65%. Wall motion was normal; there were no regional wall   motion abnormalities. Doppler parameters are consistent with   elevated ventricular end-diastolic filling pressure. - Aortic valve: There was trivial regurgitation. - Atrial septum: A patent foramen ovale cannot be excluded. - Pulmonary arteries: PA peak pressure: 46 mm Hg (S).  Lab Results  Component Value Date   WBC 10.9 (H) 09/26/2016   HGB 10.5 (L) 09/26/2016   HCT 32.9 (L) 09/26/2016   PLT 405.0 (H) 09/26/2016   GLUCOSE 90 05/31/2016   CHOL 162 05/31/2016   TRIG 90.0 05/31/2016   HDL 55.90 05/31/2016   LDLCALC 88 05/31/2016   ALT 10 05/31/2016   AST 16 05/31/2016   NA 138 05/31/2016   K 3.9 05/31/2016   CL 104 05/31/2016   CREATININE 1.06 05/31/2016   BUN 12 05/31/2016   CO2 28 05/31/2016   TSH 0.76 05/31/2016   INR 1.02 02/10/2014       Assessment & Plan:

## 2016-10-06 ENCOUNTER — Encounter: Payer: Self-pay | Admitting: Internal Medicine

## 2016-10-11 ENCOUNTER — Other Ambulatory Visit: Payer: Self-pay | Admitting: Internal Medicine

## 2016-10-11 NOTE — Telephone Encounter (Signed)
faxed

## 2016-10-11 NOTE — Telephone Encounter (Signed)
Done hardcopy to Shirron  

## 2016-10-26 ENCOUNTER — Telehealth: Payer: Self-pay | Admitting: Internal Medicine

## 2016-10-26 NOTE — Telephone Encounter (Signed)
Please advise 

## 2016-10-26 NOTE — Telephone Encounter (Signed)
The pt's daughter called stating that the pt contacted her saying that her BP was reading 186/106 ("or something like that") on Tuesday so she increased her Hydralazine to 50mg  TID. She was not sure if her BP was still hight yesterday or today but she wanted to check with Dr Jenny Reichmann to see if she should still continue to take the 50mg  or take what was originally prescribed (25mg  TID) The pt can be reached at [phone].

## 2016-10-26 NOTE — Telephone Encounter (Signed)
Ok to continue at the 50 mg tid, but please come for Nurse Visit for BP check on Mon or tues next week

## 2016-10-26 NOTE — Telephone Encounter (Signed)
Returned pt's daugther, Hassan Rowan, call and informed of on the notes below and scheduled a BP check on 10/30/16 at 2pm.

## 2016-11-23 ENCOUNTER — Other Ambulatory Visit: Payer: Self-pay | Admitting: Internal Medicine

## 2016-11-23 NOTE — Telephone Encounter (Signed)
Faxed

## 2016-11-23 NOTE — Telephone Encounter (Signed)
Done hardcopy to Shirron  

## 2016-12-04 ENCOUNTER — Other Ambulatory Visit: Payer: Self-pay | Admitting: Internal Medicine

## 2017-02-05 DIAGNOSIS — H04123 Dry eye syndrome of bilateral lacrimal glands: Secondary | ICD-10-CM | POA: Diagnosis not present

## 2017-02-05 DIAGNOSIS — H16222 Keratoconjunctivitis sicca, not specified as Sjogren's, left eye: Secondary | ICD-10-CM | POA: Diagnosis not present

## 2017-02-13 ENCOUNTER — Other Ambulatory Visit: Payer: Self-pay | Admitting: Internal Medicine

## 2017-02-13 NOTE — Telephone Encounter (Signed)
Done erx 

## 2017-03-28 ENCOUNTER — Telehealth: Payer: Self-pay | Admitting: Internal Medicine

## 2017-03-28 NOTE — Telephone Encounter (Signed)
All I can say is that options would include dulera and advair, but I am not sure what the insurance would cover.  Please ask pt to inquire at pharmacy

## 2017-03-28 NOTE — Telephone Encounter (Signed)
Copied from [place] [id number]. Topic: General - Other >> Mar 28, 2017  9:44 AM Darl Householder, RMA wrote: Reason for CRM: patient would like to know if there are any samples of Symbicort 160-45 in the office that she can have due to she has met her deductible for insurance and the pharmacy states she will have to pay $ 145 and she cannot afford it and insurance will pay again until January, please return to patient's daughter Mertie Clause at [phone]

## 2017-03-28 NOTE — Telephone Encounter (Signed)
Notified pt daughter we have no samples available. Daughter is wanting to know is there anything that she can take that is cheaper, but equivalent to the Symbicort bcz she is completely out...Johny Chess

## 2017-03-29 MED ORDER — MOMETASONE FURO-FORMOTEROL FUM 100-5 MCG/ACT IN AERO: 2.0000 | INHALATION_SPRAY | Freq: Two times a day (BID) | RESPIRATORY_TRACT | 0 refills | Status: DC

## 2017-03-29 NOTE — Telephone Encounter (Signed)
Notified daughter w/MD response. She is asking if we have sample of inhaler. Inform we did have sample of dulera, and will leave up front for pick-up...Johny Chess

## 2017-04-09 ENCOUNTER — Other Ambulatory Visit: Payer: Self-pay | Admitting: Internal Medicine

## 2017-04-10 NOTE — Telephone Encounter (Signed)
Refill done.  

## 2017-05-02 ENCOUNTER — Other Ambulatory Visit: Payer: Self-pay | Admitting: Internal Medicine

## 2017-05-02 NOTE — Telephone Encounter (Signed)
Done erx 

## 2017-05-10 ENCOUNTER — Other Ambulatory Visit: Payer: Self-pay | Admitting: Internal Medicine

## 2017-05-10 NOTE — Telephone Encounter (Signed)
Done erx 

## 2017-05-14 ENCOUNTER — Other Ambulatory Visit: Payer: Self-pay | Admitting: Internal Medicine

## 2017-05-17 ENCOUNTER — Other Ambulatory Visit: Payer: Self-pay | Admitting: Internal Medicine

## 2017-06-04 ENCOUNTER — Other Ambulatory Visit: Payer: Self-pay | Admitting: Internal Medicine

## 2017-06-04 NOTE — Telephone Encounter (Signed)
No, ok to hold on zpack refill, thanks

## 2017-06-22 ENCOUNTER — Other Ambulatory Visit: Payer: Self-pay | Admitting: *Deleted

## 2017-06-22 ENCOUNTER — Telehealth: Payer: Self-pay | Admitting: Internal Medicine

## 2017-06-22 MED ORDER — ALBUTEROL SULFATE HFA 108 (90 BASE) MCG/ACT IN AERS: 2.0000 | INHALATION_SPRAY | Freq: Four times a day (QID) | RESPIRATORY_TRACT | 0 refills | Status: DC | PRN

## 2017-06-22 NOTE — Telephone Encounter (Signed)
Rx refilled per protocol- patient needs appointment before other refills.

## 2017-06-22 NOTE — Telephone Encounter (Signed)
Copied from [place] [id number]. Topic: Quick Communication - See Telephone Encounter >> Jun 22, 2017 11:28 AM Conception Chancy, NT wrote: CRM for notification. See Telephone encounter for:  06/22/17.  Patient is trying to get inhaler refilled. Pharmacy is saying they need a new script for pro air inhaler. Please advise.  [id number] - [place], [place] - [address]

## 2017-07-10 ENCOUNTER — Other Ambulatory Visit: Payer: Self-pay | Admitting: Internal Medicine

## 2017-07-11 ENCOUNTER — Ambulatory Visit: Payer: Self-pay | Admitting: *Deleted

## 2017-07-11 NOTE — Telephone Encounter (Signed)
Pt   Also reports  Back pain    And   Needs   Order for a  Brace    - Breathing  Has  Improved  Today   After taking her fluid  Pills . Pt is  Speaking in  Complete  sentances  And  Is  In no  Distress . Spoke  To  Daughter   And  She  Wants  Appointment with  Dr  Jenny Reichmann   Rescheduled . New  Appointment  Made   Daughter  Advised  To call  Back if  Symptoms   Worse .   Reason for Disposition . [1] MILD longstanding difficulty breathing AND [2]  SAME as normal  Answer Assessment - Initial Assessment Questions 1. RESPIRATORY STATUS: "Describe your breathing?" (e.g., wheezing, shortness of breath, unable to speak, severe coughing)       Shortness  Of  Breath  On  Exertion   -  Wheezing  At times - worse  At  Night   2. ONSET: "When did this breathing problem begin?"        Pt  Reports  Symptoms  For  About  1  Month  Worse  Last  Week    3. PATTERN "Does the difficult breathing come and go, or has it been constant since it started?"         4. SEVERITY: "How bad is your breathing?" (e.g., mild, moderate, severe)    - MILD: No SOB at rest, mild SOB with walking, speaks normally in sentences, can lay down, no retractions, pulse < 100.    - MODERATE: SOB at rest, SOB with minimal exertion and prefers to sit, cannot lie down flat, speaks in phrases, mild retractions, audible wheezing, pulse 100-120.    - SEVERE: Very SOB at rest, speaks in single words, struggling to breathe, sitting hunched forward, retractions, pulse > 120      Mild   5. RECURRENT SYMPTOM: "Have you had difficulty breathing before?" If so, ask: "When was the last time?" and "What happened that time?"        Yes  - Has panic    Attacks  That  Are  releived  By  Xanax   Pt takes  Fluid  Pills     6. CARDIAC HISTORY: "Do you have any history of heart disease?" (e.g., heart attack, angina, bypass surgery, angioplasty)       Fluid   7. LUNG HISTORY: "Do you have any history of lung disease?"  (e.g., pulmonary embolus, asthma, emphysema)  Fluid  In lungs  In past   Takes  Fluid   PILLS  COPD   8. CAUSE: "What do you think is causing the breathing problem?"        Not  Sure     9. OTHER SYMPTOMS: "Do you have any other symptoms? (e.g., dizziness, runny nose, cough, chest pain, fever)     Back   Pain  -  Lower back    r  Knee  Pain   Denies  Injury   10. PREGNANCY: "Is there any chance you are pregnant?" "When was your last menstrual period?"       N/A  11. TRAVEL: "Have you traveled out of the country in the last month?" (e.g., travel history, exposures)       No  Protocols used: BREATHING DIFFICULTY-A-AH

## 2017-07-18 ENCOUNTER — Ambulatory Visit (INDEPENDENT_AMBULATORY_CARE_PROVIDER_SITE_OTHER): Payer: Medicare Other | Admitting: Internal Medicine

## 2017-07-18 ENCOUNTER — Other Ambulatory Visit (INDEPENDENT_AMBULATORY_CARE_PROVIDER_SITE_OTHER): Payer: Medicare Other

## 2017-07-18 ENCOUNTER — Encounter: Payer: Self-pay | Admitting: Internal Medicine

## 2017-07-18 ENCOUNTER — Telehealth: Payer: Self-pay | Admitting: Internal Medicine

## 2017-07-18 VITALS — BP 136/86 | HR 102 | Temp 98.1°F | Ht 64.0 in | Wt 214.0 lb

## 2017-07-18 DIAGNOSIS — F411 Generalized anxiety disorder: Secondary | ICD-10-CM | POA: Diagnosis not present

## 2017-07-18 DIAGNOSIS — M545 Low back pain: Secondary | ICD-10-CM

## 2017-07-18 DIAGNOSIS — G8929 Other chronic pain: Secondary | ICD-10-CM

## 2017-07-18 DIAGNOSIS — Z0001 Encounter for general adult medical examination with abnormal findings: Secondary | ICD-10-CM

## 2017-07-18 DIAGNOSIS — I10 Essential (primary) hypertension: Secondary | ICD-10-CM | POA: Diagnosis not present

## 2017-07-18 LAB — LIPID PANEL
CHOLESTEROL: 181 mg/dL (ref 0–200)
HDL: 67.5 mg/dL (ref >39.00)
LDL Cholesterol: 97 mg/dL (ref 0–99)
NonHDL: 113.56
Total CHOL/HDL Ratio: 3
Triglycerides: 85 mg/dL (ref 0.0–149.0)
VLDL: 17 mg/dL (ref 0.0–40.0)

## 2017-07-18 LAB — CBC WITH DIFFERENTIAL/PLATELET
BASOS PCT: 1.4 % (ref 0.0–3.0)
Basophils Absolute: 0.1 10*3/uL (ref 0.0–0.1)
EOS ABS: 0.2 10*3/uL (ref 0.0–0.7)
Eosinophils Relative: 2.7 % (ref 0.0–5.0)
HEMATOCRIT: 35.4 % — AB (ref 36.0–46.0)
HEMOGLOBIN: 11.2 g/dL — AB (ref 12.0–15.0)
LYMPHS PCT: 30.4 % (ref 12.0–46.0)
Lymphs Abs: 2.3 10*3/uL (ref 0.7–4.0)
MCHC: 31.5 g/dL (ref 30.0–36.0)
MCV: 77.3 fl — AB (ref 78.0–100.0)
MONOS PCT: 10.8 % (ref 3.0–12.0)
Monocytes Absolute: 0.8 10*3/uL (ref 0.1–1.0)
Neutro Abs: 4.1 10*3/uL (ref 1.4–7.7)
Neutrophils Relative %: 54.7 % (ref 43.0–77.0)
Platelets: 386 10*3/uL (ref 150.0–400.0)
RBC: 4.58 Mil/uL (ref 3.87–5.11)
RDW: 15.6 % — ABNORMAL HIGH (ref 11.5–15.5)
WBC: 7.5 10*3/uL (ref 4.0–10.5)

## 2017-07-18 LAB — URINALYSIS, ROUTINE W REFLEX MICROSCOPIC
BILIRUBIN URINE: NEGATIVE
Hgb urine dipstick: NEGATIVE
Ketones, ur: NEGATIVE
LEUKOCYTES UA: NEGATIVE
Nitrite: NEGATIVE
TOTAL PROTEIN, URINE-UPE24: NEGATIVE
Urine Glucose: NEGATIVE
Urobilinogen, UA: 0.2 (ref 0.0–1.0)
pH: 7 (ref 5.0–8.0)

## 2017-07-18 LAB — HEPATIC FUNCTION PANEL
ALBUMIN: 4.7 g/dL (ref 3.5–5.2)
ALK PHOS: 88 U/L (ref 39–117)
ALT: 11 U/L (ref 0–35)
AST: 19 U/L (ref 0–37)
BILIRUBIN DIRECT: 0 mg/dL (ref 0.0–0.3)
TOTAL PROTEIN: 8.9 g/dL — AB (ref 6.0–8.3)
Total Bilirubin: 0.1 mg/dL — ABNORMAL LOW (ref 0.2–1.2)

## 2017-07-18 LAB — BASIC METABOLIC PANEL
BUN: 12 mg/dL (ref 6–23)
CO2: 24 mEq/L (ref 19–32)
CREATININE: 1.05 mg/dL (ref 0.40–1.20)
Calcium: 11.4 mg/dL — ABNORMAL HIGH (ref 8.4–10.5)
Chloride: 101 mEq/L (ref 96–112)
GFR: 65.11 mL/min (ref >60.00)
Glucose, Bld: 99 mg/dL (ref 70–99)
POTASSIUM: 4.3 meq/L (ref 3.5–5.1)
Sodium: 141 mEq/L (ref 135–145)

## 2017-07-18 LAB — TSH: TSH: 0.94 u[IU]/mL (ref 0.35–4.50)

## 2017-07-18 MED ORDER — TRAMADOL HCL 50 MG PO TABS: 50.0000 mg | ORAL_TABLET | Freq: Four times a day (QID) | ORAL | 5 refills | Status: DC | PRN

## 2017-07-18 MED ORDER — PREDNISONE 10 MG PO TABS: ORAL_TABLET | ORAL | 0 refills | Status: DC

## 2017-07-18 MED ORDER — ZOSTER VAC RECOMB ADJUVANTED 50 MCG/0.5ML IM SUSR: 0.5000 mL | Freq: Once | INTRAMUSCULAR | 1 refills | Status: AC

## 2017-07-18 NOTE — Patient Instructions (Addendum)
Your shingles shot was sent to the pharmacy  Please take all new medication as prescribed - the prednisone  Please continue all other medications as before, and refills have been done if requested.  Please have the pharmacy call with any other refills you may need.  Please continue your efforts at being more active, low cholesterol diet, and weight control.  You are otherwise up to date with prevention measures today.  Please keep your appointments with your specialists as you may have planned  Please go to the LAB in the Basement (turn left off the elevator) for the tests to be done today  You will be contacted by phone if any changes need to be made immediately.  Otherwise, you will receive a letter about your results with an explanation, but please check with MyChart first.  Please remember to sign up for MyChart if you have not done so, as this will be important to you in the future with finding out test results, communicating by private email, and scheduling acute appointments online when needed.  Please return in 6 months, or sooner if needed

## 2017-07-18 NOTE — Telephone Encounter (Signed)
Spoke with pt, she would like it mailed to her.

## 2017-07-18 NOTE — Telephone Encounter (Signed)
Patient was here in the office to see Dr Jenny Reichmann today and forgot to ask about a back brace. Could a prescription be entered for her to get a back brace?

## 2017-07-18 NOTE — Progress Notes (Signed)
Subjective:    Patient ID: Wendy Torres, female    DOB: 1941-04-28, 77 y.o.   MRN: [id number]  HPI  Here for wellness and f/u;  Overall doing ok;  Pt denies Chest pain, worsening SOB, DOE, wheezing, orthopnea, PND, worsening LE edema, palpitations, dizziness or syncope.  Pt denies neurological change such as new headache, facial or extremity weakness.  Pt denies polydipsia, polyuria, or low sugar symptoms. Pt states overall good compliance with treatment and medications, good tolerability, and has been trying to follow appropriate diet.  Pt denies worsening depressive symptoms, suicidal ideation or panic. No fever, night sweats, wt loss, loss of appetite, or other constitutional symptoms.  Pt states good ability with ADL's, has low fall risk, home safety reviewed and adequate, no other significant changes in hearing or vision, and not active with exercise. Also with acute on chronic 1-2 wks midline low back , mod to severe, sharp, constant, better to sit and lie down, but stadning up and walking are worse. Pt denies bowel or bladder change, fever, wt loss,  worsening LE pain/numbness/weakness, gait change or falls.  Has ongoing severe right knee pain on ambuation, still putting off surgury.  No giveaways but wary of it.  No other interval hx or new complaints Past Medical History:  Diagnosis Date  . ABNORMAL CHEST XRAY 11/05/2008  . ACUTE URIS OF UNSPECIFIED SITE 10/22/2007  . ALLERGIC RHINITIS 12/09/2006  . ANEMIA 01/14/2009  . Anxiety   . Arthritis   . ASTHMA 12/09/2006  . Blood transfusion without reported diagnosis   . Cataract    bilateral -removed  . Chronic pain syndrome 02/16/2009  . COPD 12/09/2006  . DEGENERATIVE JOINT DISEASE 12/09/2006  . DIZZINESS 03/30/2010  . DYSPNEA 01/27/2010   with heavy exertion  . FREQUENCY, URINARY 03/30/2010  . GERD 12/09/2006  . Glossitis 01/14/2009  . HELICOBACTER PYLORI GASTRITIS, HX OF 12/09/2006  . HIATAL HERNIA 03/01/2010  . HYPERLIPIDEMIA 04/18/2007    . HYPERSOMNIA 08/31/2008  . HYPERTENSION 12/09/2006  . INSOMNIA-SLEEP DISORDER-UNSPEC 11/05/2008  . LOW BACK PAIN 12/09/2006  . Morbid obesity ([place]) 12/09/2006  . Nonspecific (abnormal) findings on radiological and other examination of body structure 11/05/2008  . OSTEOPOROSIS 12/09/2006  . OVERACTIVE BLADDER 04/14/2008  . PALPITATIONS, RECURRENT 01/27/2010  . Stroke [facility])    pt. stated light one years ago  . SYNCOPE 11/05/2008  . TRANSIENT ISCHEMIC ATTACK, HX OF 12/09/2006   Past Surgical History:  Procedure Laterality Date  . COLONOSCOPY    . LUMBAR DISC SURGERY    . TONSILLECTOMY     as a child  . TOTAL HIP ARTHROPLASTY Right 11/06/2012   Procedure: RIGHT TOTAL HIP ARTHROPLASTY WITH ACETABULAR AUTOGRAFT;  Surgeon: Gearlean Alf, MD;  Location: [facility];  Service: Orthopedics;  Laterality: Right;  . TOTAL HIP ARTHROPLASTY Left 02/18/2014   Procedure: LEFT TOTAL HIP ARTHROPLASTY;  Surgeon: Gearlean Alf, MD;  Location: [facility];  Service: Orthopedics;  Laterality: Left;  . TUBAL LIGATION      reports that she quit smoking about 44 years ago. Her smoking use included cigarettes. She has a 12.50 pack-year smoking history. She has never used smokeless tobacco. She reports that she does not drink alcohol or use drugs. family history includes Heart disease in her mother; Hypertension in her brother; Lung cancer in her father. No Known Allergies Current Outpatient Medications on File Prior to Visit  Medication Sig Dispense Refill  . acetaminophen (TYLENOL) 325 MG tablet Take 2 tablets (650  mg total) by mouth every 6 (six) hours as needed for mild pain (or Fever >/= 101). 40 tablet 0  . ALPRAZolam (XANAX) 0.25 MG tablet TAKE 1 TABLET BY MOUTH TWICE A DAY FOR ANXIETY 60 tablet 5  . amLODipine-benazepril (LOTREL) 10-40 MG capsule TAKE 1 CAPSULE BY MOUTH  DAILY 90 capsule 0  . bisacodyl (DULCOLAX) 10 MG suppository Place 1 suppository (10 mg total) rectally daily as needed for moderate constipation. 12  suppository 0  . budesonide-formoterol (SYMBICORT) 160-4.5 MCG/ACT inhaler Inhale 2 puffs into the lungs 2 (two) times daily. 3 Inhaler 3  . cetirizine (ZYRTEC) 10 MG tablet TAKE 1 TABLET BY MOUTH EVERY DAY 30 tablet 4  . cloNIDine (CATAPRES) 0.3 MG tablet TAKE 1 TABLET BY MOUTH TWO  TIMES DAILY 180 tablet 0  . docusate sodium 100 MG CAPS Take 100 mg by mouth 2 (two) times daily. 10 capsule 0  . furosemide (LASIX) 20 MG tablet TAKE 1 TABLET BY MOUTH  EVERY MORNING 90 tablet 0  . gabapentin (NEURONTIN) 300 MG capsule TAKE 1 CAPSULE BY MOUTH 3  TIMES DAILY 270 capsule 0  . hydrALAZINE (APRESOLINE) 25 MG tablet Take 1 tablet (25 mg total) by mouth 3 (three) times daily. 270 tablet 3  . hydrALAZINE (APRESOLINE) 25 MG tablet TAKE 1 TABLET BY MOUTH 3  TIMES DAILY 270 tablet 0  . hydrocortisone-pramoxine (ANALPRAM HC) 2.5-1 % rectal cream Apply rectally twice daily. 30 g 1  . irbesartan (AVAPRO) 300 MG tablet Take 1 tablet (300 mg total) by mouth daily. 90 tablet 3  . lovastatin (MEVACOR) 40 MG tablet TAKE 1 TABLET BY MOUTH AT  BEDTIME 90 tablet 0  . mometasone-formoterol (DULERA) 100-5 MCG/ACT AERO Inhale 2 puffs into the lungs 2 (two) times daily. 13 g 0  . nystatin (MYCOSTATIN) 100000 UNIT/ML suspension TAKE 1 TEASPOONFUL IN THE MOUTH OR THROAT 4 TIMES A DAY AS NEEDED 60 mL 1  . oxybutynin (DITROPAN) 5 MG tablet TAKE 1 TABLET BY MOUTH 3  TIMES DAILY 270 tablet 0  . pantoprazole (PROTONIX) 40 MG tablet TAKE 1 TABLET BY MOUTH  DAILY 90 tablet 0  . Polyethyl Glycol-Propyl Glycol (SYSTANE OP) Place 1 drop into both eyes. Three to four times daily.    . polyethylene glycol (MIRALAX / GLYCOLAX) packet Take 17 g by mouth daily as needed for mild constipation. 14 each 0  . tiZANidine (ZANAFLEX) 4 MG tablet TAKE 1 TABLET (4 MG TOTAL) BY MOUTH EVERY 6 (SIX) HOURS AS NEEDED FOR MUSCLE SPASMS. 50 tablet 1  . traZODone (DESYREL) 50 MG tablet TAKE 1/2 TO 1 TABLET BY MOUTH AT BEDTIME AS NEEDED FOR SLEEP 60 tablet 5    . [DISCONTINUED] lansoprazole (PREVACID) 30 MG capsule Take 1 capsule (30 mg total) by mouth daily. 90 capsule 3   Current Facility-Administered Medications on File Prior to Visit  Medication Dose Route Frequency Provider Last Rate Last Dose  . tranexamic acid (CYKLOKAPRON) 2,000 mg in sodium chloride 0.9 % 50 mL Topical Application  0,272 mg Topical Once Cecilio Asper, [employer], PA-C       Review of Systems Constitutional: Negative for other unusual diaphoresis, sweats, appetite or weight changes HENT: Negative for other worsening hearing loss, ear pain, facial swelling, mouth sores or neck stiffness.   Eyes: Negative for other worsening pain, redness or other visual disturbance.  Respiratory: Negative for other stridor or swelling Cardiovascular: Negative for other palpitations or other chest pain  Gastrointestinal: Negative for worsening diarrhea or loose stools,  blood in stool, distention or other pain Genitourinary: Negative for hematuria, flank pain or other change in urine volume.  Musculoskeletal: Negative for myalgias or other joint swelling.  Skin: Negative for other color change, or other wound or worsening drainage.  Neurological: Negative for other syncope or numbness. Hematological: Negative for other adenopathy or swelling Psychiatric/Behavioral: Negative for hallucinations, other worsening agitation, SI, self-injury, or new decreased concentration All other system neg per pt    Objective:   Physical Exam BP 136/86   Pulse (!) 102   Temp 98.1 F (36.7 C) (Oral)   Ht 5\' 4"  (1.626 m)   Wt 214 lb (97.1 kg)   SpO2 98%   BMI 36.73 kg/m  VS noted,  Constitutional: Pt is oriented to person, place, and time. Appears well-developed and well-nourished, in no significant distress and comfortable Head: Normocephalic and atraumatic  Eyes: Conjunctivae and EOM are normal. Pupils are equal, round, and reactive to light Right Ear: External ear normal without discharge Left Ear:  External ear normal without discharge Nose: Nose without discharge or deformity Mouth/Throat: Oropharynx is without other ulcerations and moist  Neck: Normal range of motion. Neck supple. No JVD present. No tracheal deviation present or significant neck LA or mass Cardiovascular: Normal rate, regular rhythm, normal heart sounds and intact distal pulses.   Pulmonary/Chest: WOB normal and breath sounds without rales or wheezing  Abdominal: Soft. Bowel sounds are normal. NT. No HSM  Musculoskeletal: Normal range of motion. Exhibits no edema, spine with diffuse lower lumbar tender, right knee with marked degenerative change and effusion Lymphadenopathy: Has no other cervical adenopathy.  Neurological: Pt is alert and oriented to person, place, and time. Pt has normal reflexes. No cranial nerve deficit. Motor grossly intact, Gait intact Skin: Skin is warm and dry. No rash noted or new ulcerations Psychiatric:  Has mild nervous mood and affect. Behavior is normal without agitation No other exam findings     Assessment & Plan:

## 2017-07-18 NOTE — Telephone Encounter (Signed)
This is not normally availabe as DME equipment, but I can do a prescription to send her in mail, to then take to a local medical supply store - done hardcopy to shirron

## 2017-07-19 ENCOUNTER — Ambulatory Visit: Payer: Medicare Other | Admitting: Internal Medicine

## 2017-07-19 NOTE — Telephone Encounter (Signed)
-----   Message from Biagio Borg, MD sent at 07/18/2017  6:07 PM EDT ----- Left message on MyChart, pt to cont same tx except  The test results show that your current treatment is OK, except the calcium level is very mildly elevated.  We will add a order for Parathyroid hormone to be done as well if possible, to make sure this is not a hormonal problem.      shirron for addon PTH with calcium test - hypercalcemia

## 2017-07-19 NOTE — Telephone Encounter (Signed)
Informed pt of results. She expressed understanding.

## 2017-07-20 ENCOUNTER — Telehealth: Payer: Self-pay

## 2017-07-20 ENCOUNTER — Other Ambulatory Visit: Payer: Self-pay | Admitting: Internal Medicine

## 2017-07-20 NOTE — Telephone Encounter (Signed)
-----   Message from Biagio Borg, MD sent at 07/19/2017  8:01 PM EDT ----- Yes please, and let me know if I need to order this, thanks ----- Message ----- From: Juliet Rude, CMA Sent: 07/19/2017   3:23 PM To: Biagio Borg, MD  Lab stated add on could not be done since it is a frozen test. Please advise.  Would you like for her to come in to have it drawn?

## 2017-07-20 NOTE — Telephone Encounter (Signed)
Spoke with pt's daughter Hassan Rowan, informed her of below info. She will bring her early next week or have one of her other sisters bring the patient to the lab to have lab drawn.

## 2017-07-21 ENCOUNTER — Encounter: Payer: Self-pay | Admitting: Internal Medicine

## 2017-07-21 NOTE — Assessment & Plan Note (Signed)
stable overall by history and exam, recent data reviewed with pt, and pt to continue medical treatment as before,  to f/u any worsening symptoms or concerns  

## 2017-07-21 NOTE — Assessment & Plan Note (Signed)

## 2017-07-21 NOTE — Assessment & Plan Note (Addendum)
With mild acute on chronic flare, no neuro changes, for predpac asd, and tramadol prn refill  In addition to the time spent performing CPE, I spent an additional 15 minutes face to face,in which greater than 50% of this time was spent in counseling and coordination of care for patient's illness as documented, including the differential dx, treatment, further evaluation and other management of acute on chronic LBP, HTN and anxiety

## 2017-07-23 ENCOUNTER — Other Ambulatory Visit (INDEPENDENT_AMBULATORY_CARE_PROVIDER_SITE_OTHER): Payer: Medicare Other

## 2017-07-23 DIAGNOSIS — D509 Iron deficiency anemia, unspecified: Secondary | ICD-10-CM | POA: Diagnosis not present

## 2017-07-23 LAB — IBC PANEL
IRON: 18 ug/dL — AB (ref 42–145)
SATURATION RATIOS: 4.4 % — AB (ref 20.0–50.0)
Transferrin: 294 mg/dL (ref 212.0–360.0)

## 2017-07-24 ENCOUNTER — Encounter: Payer: Self-pay | Admitting: Internal Medicine

## 2017-07-24 ENCOUNTER — Other Ambulatory Visit: Payer: Self-pay | Admitting: Internal Medicine

## 2017-07-24 DIAGNOSIS — D509 Iron deficiency anemia, unspecified: Secondary | ICD-10-CM

## 2017-07-25 ENCOUNTER — Ambulatory Visit: Payer: Medicare Other | Admitting: Internal Medicine

## 2017-07-26 ENCOUNTER — Telehealth: Payer: Self-pay

## 2017-07-26 NOTE — Telephone Encounter (Signed)
Called pt's daughter, Hassan Rowan, LVM   CRM created

## 2017-07-26 NOTE — Telephone Encounter (Signed)
-----   Message from Biagio Borg, MD sent at 07/24/2017  7:32 PM EDT ----- Letter sent, cont same tx except  The test results show that your current treatment is OK, except the iron is low again, and you are not taking an anticoagulant.  You were last evaluated in 2016, and 2012 before that.  Again, we should refer you to Dr Ardis Hughs, Gastroenterology, for further consideration of any other evaluation.  Please also continue the iron as you are.    Shirron to please inform pt, I will do referral

## 2017-07-30 ENCOUNTER — Telehealth: Payer: Self-pay | Admitting: Internal Medicine

## 2017-07-30 NOTE — Telephone Encounter (Signed)
Spoke to patient and her daughter- see lab note

## 2017-07-30 NOTE — Telephone Encounter (Unsigned)
Copied from [place] [id number]. Topic: Quick Communication - Lab Results >> Jul 30, 2017  9:25 AM Carolyn Stare wrote:  Pt returning call for lab results [phone]

## 2017-08-15 ENCOUNTER — Telehealth: Payer: Self-pay | Admitting: Internal Medicine

## 2017-08-15 MED ORDER — NYSTATIN 100000 UNIT/ML MT SUSP: OROMUCOSAL | 1 refills | Status: DC

## 2017-08-15 NOTE — Telephone Encounter (Signed)
This could be thrush related to symbicort  OK for nystatin soln - done erx  Please rinse mouth after each symbicort use

## 2017-08-15 NOTE — Telephone Encounter (Signed)
Copied from [place] [id number]. Topic: Inquiry >> Aug 15, 2017  9:48 AM Margot Ables wrote: Reason for CRM: pt mouth is sore and white coating on her tongue. She is thinking she has thrush from the Symbicort inhaler. Please advise pt or Pam with questions/concerns.  [id number] - [place], [place] [address] [phone] (Phone) [phone] (Fax)

## 2017-08-15 NOTE — Telephone Encounter (Signed)
Called Pam, left msg with details below.

## 2017-08-15 NOTE — Telephone Encounter (Signed)
Does pt need a medication? Would she need to rinse with water after using symbicort?

## 2017-08-25 ENCOUNTER — Other Ambulatory Visit: Payer: Self-pay

## 2017-08-25 ENCOUNTER — Inpatient Hospital Stay (HOSPITAL_COMMUNITY)
Admission: EM | Admit: 2017-08-25 | Discharge: 2017-08-27 | DRG: 190 | Disposition: A | Discharge status: Home Health | Payer: Medicare Other | Attending: Family Medicine | Admitting: Family Medicine

## 2017-08-25 ENCOUNTER — Emergency Department (HOSPITAL_COMMUNITY): Payer: Medicare Other

## 2017-08-25 ENCOUNTER — Encounter (HOSPITAL_COMMUNITY): Payer: Self-pay | Admitting: Emergency Medicine

## 2017-08-25 DIAGNOSIS — I5030 Unspecified diastolic (congestive) heart failure: Secondary | ICD-10-CM | POA: Diagnosis present

## 2017-08-25 DIAGNOSIS — R0602 Shortness of breath: Secondary | ICD-10-CM | POA: Diagnosis not present

## 2017-08-25 DIAGNOSIS — R030 Elevated blood-pressure reading, without diagnosis of hypertension: Secondary | ICD-10-CM | POA: Diagnosis not present

## 2017-08-25 DIAGNOSIS — N3281 Overactive bladder: Secondary | ICD-10-CM | POA: Diagnosis present

## 2017-08-25 DIAGNOSIS — J209 Acute bronchitis, unspecified: Secondary | ICD-10-CM | POA: Diagnosis present

## 2017-08-25 DIAGNOSIS — J441 Chronic obstructive pulmonary disease with (acute) exacerbation: Principal | ICD-10-CM | POA: Diagnosis present

## 2017-08-25 DIAGNOSIS — J449 Chronic obstructive pulmonary disease, unspecified: Secondary | ICD-10-CM | POA: Diagnosis present

## 2017-08-25 DIAGNOSIS — K219 Gastro-esophageal reflux disease without esophagitis: Secondary | ICD-10-CM | POA: Diagnosis not present

## 2017-08-25 DIAGNOSIS — R531 Weakness: Secondary | ICD-10-CM | POA: Diagnosis present

## 2017-08-25 DIAGNOSIS — J9621 Acute and chronic respiratory failure with hypoxia: Secondary | ICD-10-CM | POA: Diagnosis present

## 2017-08-25 DIAGNOSIS — F419 Anxiety disorder, unspecified: Secondary | ICD-10-CM | POA: Diagnosis present

## 2017-08-25 DIAGNOSIS — M810 Age-related osteoporosis without current pathological fracture: Secondary | ICD-10-CM | POA: Diagnosis present

## 2017-08-25 DIAGNOSIS — J45909 Unspecified asthma, uncomplicated: Secondary | ICD-10-CM | POA: Diagnosis present

## 2017-08-25 DIAGNOSIS — E785 Hyperlipidemia, unspecified: Secondary | ICD-10-CM | POA: Diagnosis not present

## 2017-08-25 DIAGNOSIS — Z96643 Presence of artificial hip joint, bilateral: Secondary | ICD-10-CM | POA: Diagnosis not present

## 2017-08-25 DIAGNOSIS — G629 Polyneuropathy, unspecified: Secondary | ICD-10-CM | POA: Diagnosis not present

## 2017-08-25 DIAGNOSIS — D509 Iron deficiency anemia, unspecified: Secondary | ICD-10-CM | POA: Diagnosis present

## 2017-08-25 DIAGNOSIS — T380X5A Adverse effect of glucocorticoids and synthetic analogues, initial encounter: Secondary | ICD-10-CM | POA: Diagnosis not present

## 2017-08-25 DIAGNOSIS — Z7951 Long term (current) use of inhaled steroids: Secondary | ICD-10-CM

## 2017-08-25 DIAGNOSIS — Z8719 Personal history of other diseases of the digestive system: Secondary | ICD-10-CM

## 2017-08-25 DIAGNOSIS — I10 Essential (primary) hypertension: Secondary | ICD-10-CM | POA: Diagnosis present

## 2017-08-25 DIAGNOSIS — B963 Hemophilus influenzae [H. influenzae] as the cause of diseases classified elsewhere: Secondary | ICD-10-CM | POA: Diagnosis present

## 2017-08-25 DIAGNOSIS — M791 Myalgia, unspecified site: Secondary | ICD-10-CM | POA: Diagnosis not present

## 2017-08-25 DIAGNOSIS — M545 Low back pain, unspecified: Secondary | ICD-10-CM | POA: Diagnosis present

## 2017-08-25 DIAGNOSIS — I5032 Chronic diastolic (congestive) heart failure: Secondary | ICD-10-CM | POA: Diagnosis not present

## 2017-08-25 DIAGNOSIS — R5383 Other fatigue: Secondary | ICD-10-CM | POA: Diagnosis not present

## 2017-08-25 DIAGNOSIS — M5412 Radiculopathy, cervical region: Secondary | ICD-10-CM | POA: Diagnosis present

## 2017-08-25 DIAGNOSIS — M1990 Unspecified osteoarthritis, unspecified site: Secondary | ICD-10-CM | POA: Diagnosis present

## 2017-08-25 DIAGNOSIS — G4700 Insomnia, unspecified: Secondary | ICD-10-CM | POA: Diagnosis not present

## 2017-08-25 DIAGNOSIS — J440 Chronic obstructive pulmonary disease with acute lower respiratory infection: Secondary | ICD-10-CM | POA: Diagnosis not present

## 2017-08-25 DIAGNOSIS — G894 Chronic pain syndrome: Secondary | ICD-10-CM | POA: Diagnosis not present

## 2017-08-25 DIAGNOSIS — N318 Other neuromuscular dysfunction of bladder: Secondary | ICD-10-CM | POA: Diagnosis present

## 2017-08-25 DIAGNOSIS — I110 Hypertensive heart disease with heart failure: Secondary | ICD-10-CM | POA: Diagnosis not present

## 2017-08-25 DIAGNOSIS — J9601 Acute respiratory failure with hypoxia: Secondary | ICD-10-CM | POA: Diagnosis not present

## 2017-08-25 DIAGNOSIS — Z6836 Body mass index (BMI) 36.0-36.9, adult: Secondary | ICD-10-CM

## 2017-08-25 DIAGNOSIS — R630 Anorexia: Secondary | ICD-10-CM | POA: Diagnosis present

## 2017-08-25 DIAGNOSIS — Z8673 Personal history of transient ischemic attack (TIA), and cerebral infarction without residual deficits: Secondary | ICD-10-CM

## 2017-08-25 DIAGNOSIS — Z79899 Other long term (current) drug therapy: Secondary | ICD-10-CM

## 2017-08-25 DIAGNOSIS — J4489 Other specified chronic obstructive pulmonary disease: Secondary | ICD-10-CM | POA: Diagnosis present

## 2017-08-25 DIAGNOSIS — G4489 Other headache syndrome: Secondary | ICD-10-CM | POA: Diagnosis not present

## 2017-08-25 DIAGNOSIS — Z87891 Personal history of nicotine dependence: Secondary | ICD-10-CM

## 2017-08-25 DIAGNOSIS — I509 Heart failure, unspecified: Secondary | ICD-10-CM

## 2017-08-25 HISTORY — DX: Personal history of other diseases of the digestive system: Z87.19

## 2017-08-25 HISTORY — DX: Heart failure, unspecified: I50.9

## 2017-08-25 LAB — BASIC METABOLIC PANEL
ANION GAP: 10 (ref 5–15)
BUN: 7 mg/dL (ref 6–20)
CALCIUM: 10 mg/dL (ref 8.9–10.3)
CO2: 26 mmol/L (ref 22–32)
Chloride: 101 mmol/L (ref 101–111)
Creatinine, Ser: 0.98 mg/dL (ref 0.44–1.00)
GFR calc Af Amer: >60 mL/min (ref >60)
GFR, EST NON AFRICAN AMERICAN: 54 mL/min — AB (ref >60)
GLUCOSE: 114 mg/dL — AB (ref 65–99)
Potassium: 4 mmol/L (ref 3.5–5.1)
SODIUM: 137 mmol/L (ref 135–145)

## 2017-08-25 LAB — BRAIN NATRIURETIC PEPTIDE: B Natriuretic Peptide: 30.3 pg/mL (ref 0.0–100.0)

## 2017-08-25 LAB — CBC
HCT: 38 % (ref 36.0–46.0)
Hemoglobin: 12.1 g/dL (ref 12.0–15.0)
MCH: 25.3 pg — ABNORMAL LOW (ref 26.0–34.0)
MCHC: 31.8 g/dL (ref 30.0–36.0)
MCV: 79.3 fL (ref 78.0–100.0)
PLATELETS: 406 10*3/uL — AB (ref 150–400)
RBC: 4.79 MIL/uL (ref 3.87–5.11)
RDW: 18.2 % — AB (ref 11.5–15.5)
WBC: 11.7 10*3/uL — AB (ref 4.0–10.5)

## 2017-08-25 LAB — URINALYSIS, ROUTINE W REFLEX MICROSCOPIC
Bilirubin Urine: NEGATIVE
Glucose, UA: NEGATIVE mg/dL
HGB URINE DIPSTICK: NEGATIVE
KETONES UR: NEGATIVE mg/dL
LEUKOCYTES UA: NEGATIVE
Nitrite: NEGATIVE
Protein, ur: NEGATIVE mg/dL
Specific Gravity, Urine: 1.005 (ref 1.005–1.030)
pH: 8 (ref 5.0–8.0)

## 2017-08-25 LAB — TROPONIN I: Troponin I: <0.03 ng/mL (ref <0.03)

## 2017-08-25 LAB — I-STAT CG4 LACTIC ACID, ED: Lactic Acid, Venous: 1.68 mmol/L (ref 0.5–1.9)

## 2017-08-25 LAB — INFLUENZA PANEL BY PCR (TYPE A & B)
INFLAPCR: NEGATIVE
Influenza B By PCR: NEGATIVE

## 2017-08-25 MED ORDER — AMLODIPINE BESY-BENAZEPRIL HCL 10-40 MG PO CAPS: 1.0000 | ORAL_CAPSULE | Freq: Every day | ORAL | Status: DC

## 2017-08-25 MED ORDER — HYDRALAZINE HCL 20 MG/ML IJ SOLN
10.0000 mg | Freq: Four times a day (QID) | INTRAMUSCULAR | Status: DC | PRN
  Administered 2017-08-25: 10 mg via INTRAVENOUS
  Filled 2017-08-25: qty 1

## 2017-08-25 MED ORDER — OXYBUTYNIN CHLORIDE 5 MG PO TABS
5.0000 mg | ORAL_TABLET | Freq: Three times a day (TID) | ORAL | Status: DC
  Administered 2017-08-25 – 2017-08-27 (×7): 5 mg via ORAL
  Filled 2017-08-25 (×7): qty 1

## 2017-08-25 MED ORDER — METHYLPREDNISOLONE SODIUM SUCC 125 MG IJ SOLR
125.0000 mg | Freq: Once | INTRAMUSCULAR | Status: AC
  Administered 2017-08-25: 125 mg via INTRAVENOUS
  Filled 2017-08-25: qty 2

## 2017-08-25 MED ORDER — IPRATROPIUM-ALBUTEROL 0.5-2.5 (3) MG/3ML IN SOLN: 3.0000 mL | RESPIRATORY_TRACT | Status: DC | PRN

## 2017-08-25 MED ORDER — ALBUTEROL SULFATE (2.5 MG/3ML) 0.083% IN NEBU
5.0000 mg | INHALATION_SOLUTION | Freq: Once | RESPIRATORY_TRACT | Status: AC
  Administered 2017-08-25: 5 mg via RESPIRATORY_TRACT
  Filled 2017-08-25: qty 6

## 2017-08-25 MED ORDER — BISACODYL 10 MG RE SUPP: 10.0000 mg | Freq: Every day | RECTAL | Status: DC | PRN

## 2017-08-25 MED ORDER — POLYETHYLENE GLYCOL 3350 17 G PO PACK: 17.0000 g | PACK | Freq: Every day | ORAL | Status: DC | PRN

## 2017-08-25 MED ORDER — SODIUM CHLORIDE 0.9 % IV SOLN
100.0000 mg | Freq: Once | INTRAVENOUS | Status: AC
  Administered 2017-08-25: 100 mg via INTRAVENOUS
  Filled 2017-08-25: qty 100

## 2017-08-25 MED ORDER — TRAMADOL HCL 50 MG PO TABS
50.0000 mg | ORAL_TABLET | Freq: Four times a day (QID) | ORAL | Status: DC | PRN
  Administered 2017-08-26 (×2): 50 mg via ORAL
  Administered 2017-08-27: 100 mg via ORAL
  Administered 2017-08-27: 50 mg via ORAL
  Administered 2017-08-27: 100 mg via ORAL
  Filled 2017-08-25 (×2): qty 1
  Filled 2017-08-25 (×2): qty 2
  Filled 2017-08-25: qty 1

## 2017-08-25 MED ORDER — ACETAMINOPHEN 325 MG PO TABS
650.0000 mg | ORAL_TABLET | Freq: Four times a day (QID) | ORAL | Status: DC | PRN
  Administered 2017-08-26: 650 mg via ORAL
  Filled 2017-08-25: qty 2

## 2017-08-25 MED ORDER — TIZANIDINE HCL 4 MG PO TABS
4.0000 mg | ORAL_TABLET | Freq: Four times a day (QID) | ORAL | Status: DC | PRN
  Administered 2017-08-26 – 2017-08-27 (×4): 4 mg via ORAL
  Filled 2017-08-25 (×5): qty 1

## 2017-08-25 MED ORDER — SODIUM CHLORIDE 0.9 % IV BOLUS
1000.0000 mL | Freq: Once | INTRAVENOUS | Status: AC
  Administered 2017-08-25: 1000 mL via INTRAVENOUS

## 2017-08-25 MED ORDER — HYDRALAZINE HCL 25 MG PO TABS
25.0000 mg | ORAL_TABLET | Freq: Three times a day (TID) | ORAL | Status: DC
  Administered 2017-08-25 – 2017-08-26 (×2): 25 mg via ORAL
  Filled 2017-08-25 (×3): qty 1

## 2017-08-25 MED ORDER — POLYVINYL ALCOHOL 1.4 % OP SOLN
1.0000 [drp] | Freq: Four times a day (QID) | OPHTHALMIC | Status: DC | PRN
  Filled 2017-08-25: qty 15

## 2017-08-25 MED ORDER — GABAPENTIN 300 MG PO CAPS
300.0000 mg | ORAL_CAPSULE | Freq: Three times a day (TID) | ORAL | Status: DC
  Administered 2017-08-25 – 2017-08-27 (×7): 300 mg via ORAL
  Filled 2017-08-25 (×7): qty 1

## 2017-08-25 MED ORDER — ACETAMINOPHEN 650 MG RE SUPP: 650.0000 mg | Freq: Four times a day (QID) | RECTAL | Status: DC | PRN

## 2017-08-25 MED ORDER — SENNOSIDES-DOCUSATE SODIUM 8.6-50 MG PO TABS: 1.0000 | ORAL_TABLET | Freq: Every evening | ORAL | Status: DC | PRN

## 2017-08-25 MED ORDER — ALPRAZOLAM 0.25 MG PO TABS
0.2500 mg | ORAL_TABLET | Freq: Two times a day (BID) | ORAL | Status: DC | PRN
  Administered 2017-08-25 – 2017-08-27 (×4): 0.25 mg via ORAL
  Filled 2017-08-25 (×4): qty 1

## 2017-08-25 MED ORDER — ONDANSETRON HCL 4 MG/2ML IJ SOLN: 4.0000 mg | Freq: Four times a day (QID) | INTRAMUSCULAR | Status: DC | PRN

## 2017-08-25 MED ORDER — IPRATROPIUM BROMIDE 0.02 % IN SOLN
0.5000 mg | Freq: Once | RESPIRATORY_TRACT | Status: AC
  Administered 2017-08-25: 0.5 mg via RESPIRATORY_TRACT
  Filled 2017-08-25: qty 2.5

## 2017-08-25 MED ORDER — PANTOPRAZOLE SODIUM 40 MG PO TBEC
40.0000 mg | DELAYED_RELEASE_TABLET | Freq: Every day | ORAL | Status: DC
  Administered 2017-08-25 – 2017-08-27 (×3): 40 mg via ORAL
  Filled 2017-08-25 (×3): qty 1

## 2017-08-25 MED ORDER — METHYLPREDNISOLONE SODIUM SUCC 40 MG IJ SOLR
40.0000 mg | Freq: Two times a day (BID) | INTRAMUSCULAR | Status: DC
  Administered 2017-08-26 – 2017-08-27 (×4): 40 mg via INTRAVENOUS
  Filled 2017-08-25 (×4): qty 1

## 2017-08-25 MED ORDER — IPRATROPIUM-ALBUTEROL 0.5-2.5 (3) MG/3ML IN SOLN
3.0000 mL | Freq: Once | RESPIRATORY_TRACT | Status: AC
  Administered 2017-08-25: 3 mL via RESPIRATORY_TRACT
  Filled 2017-08-25: qty 3

## 2017-08-25 MED ORDER — CLONIDINE HCL 0.3 MG PO TABS
0.3000 mg | ORAL_TABLET | Freq: Two times a day (BID) | ORAL | Status: DC
  Administered 2017-08-25 – 2017-08-27 (×5): 0.3 mg via ORAL
  Filled 2017-08-25 (×5): qty 1

## 2017-08-25 MED ORDER — METHYLPREDNISOLONE SODIUM SUCC 125 MG IJ SOLR: 60.0000 mg | Freq: Two times a day (BID) | INTRAMUSCULAR | Status: DC

## 2017-08-25 MED ORDER — BENAZEPRIL HCL 40 MG PO TABS
40.0000 mg | ORAL_TABLET | Freq: Every day | ORAL | Status: DC
  Administered 2017-08-25 – 2017-08-27 (×3): 40 mg via ORAL
  Filled 2017-08-25 (×4): qty 1

## 2017-08-25 MED ORDER — ONDANSETRON HCL 4 MG PO TABS: 4.0000 mg | ORAL_TABLET | Freq: Four times a day (QID) | ORAL | Status: DC | PRN

## 2017-08-25 MED ORDER — AMLODIPINE BESYLATE 10 MG PO TABS
10.0000 mg | ORAL_TABLET | Freq: Every day | ORAL | Status: DC
  Administered 2017-08-25 – 2017-08-27 (×3): 10 mg via ORAL
  Filled 2017-08-25: qty 2
  Filled 2017-08-25 (×2): qty 1

## 2017-08-25 MED ORDER — HYDROCODONE-ACETAMINOPHEN 5-325 MG PO TABS: 1.0000 | ORAL_TABLET | ORAL | Status: DC | PRN

## 2017-08-25 MED ORDER — ENOXAPARIN SODIUM 40 MG/0.4ML ~~LOC~~ SOLN
40.0000 mg | SUBCUTANEOUS | Status: DC
  Administered 2017-08-26: 40 mg via SUBCUTANEOUS
  Filled 2017-08-25 (×2): qty 0.4

## 2017-08-25 MED ORDER — GUAIFENESIN ER 600 MG PO TB12
600.0000 mg | ORAL_TABLET | Freq: Two times a day (BID) | ORAL | Status: DC | PRN
  Administered 2017-08-26: 600 mg via ORAL
  Filled 2017-08-25: qty 1

## 2017-08-25 MED ORDER — FUROSEMIDE 20 MG PO TABS
20.0000 mg | ORAL_TABLET | Freq: Every morning | ORAL | Status: DC
  Administered 2017-08-25 – 2017-08-27 (×3): 20 mg via ORAL
  Filled 2017-08-25 (×4): qty 1

## 2017-08-25 MED ORDER — ALBUTEROL SULFATE (2.5 MG/3ML) 0.083% IN NEBU: 5.0000 mg | INHALATION_SOLUTION | RESPIRATORY_TRACT | Status: DC | PRN

## 2017-08-25 MED ORDER — PRAVASTATIN SODIUM 40 MG PO TABS
40.0000 mg | ORAL_TABLET | Freq: Every day | ORAL | Status: DC
  Administered 2017-08-25 – 2017-08-26 (×2): 40 mg via ORAL
  Filled 2017-08-25 (×2): qty 1

## 2017-08-25 MED ORDER — SODIUM CHLORIDE 0.9 % IV SOLN
100.0000 mg | INTRAVENOUS | Status: DC
  Administered 2017-08-26: 100 mg via INTRAVENOUS
  Filled 2017-08-25 (×2): qty 100

## 2017-08-25 NOTE — ED Provider Notes (Addendum)
[facility] EMERGENCY DEPARTMENT Provider Note   CSN: [id number] Arrival date & time: 08/25/17  7829     History   Chief Complaint Chief Complaint  Patient presents with  . Headache  . Back Pain  . Hypertension    HPI Wendy Torres is a 77 y.o. female.  HPI   77 year old female with past medical history as below including COPD here with generalized weakness.  The patient states that 2 days ago, she began to develop diffuse body aches, cough, and increasing shortness of breath.  She is been weak and had a poor appetite as well.  She endorses worsening myalgias throughout the body, particularly her knees, which is where she has chronic arthritis.  Throughout the day today, she said progressive worsening cough and shortness of breath.  She lives with her husband and family noticed that she has been having a difficult time getting around the house.  She subsequent presents for evaluation.  She is had a cough productive of occasional yellow-green sputum.  No known fevers.  Denies any chest pain.  Denies any abdominal pain, but has had decreased appetite.  No vomiting or diarrhea.  No rash.  No known recent sick contacts.  Past Medical History:  Diagnosis Date  . ABNORMAL CHEST XRAY 11/05/2008  . ACUTE URIS OF UNSPECIFIED SITE 10/22/2007  . ALLERGIC RHINITIS 12/09/2006  . ANEMIA 01/14/2009  . Anxiety   . Arthritis   . ASTHMA 12/09/2006  . Blood transfusion without reported diagnosis   . Cataract    bilateral -removed  . CHF (congestive heart failure) ([place])   . Chronic pain syndrome 02/16/2009  . COPD 12/09/2006  . DEGENERATIVE JOINT DISEASE 12/09/2006  . DIZZINESS 03/30/2010  . DYSPNEA 01/27/2010   with heavy exertion  . FREQUENCY, URINARY 03/30/2010  . GERD 12/09/2006  . Glossitis 01/14/2009  . HELICOBACTER PYLORI GASTRITIS, HX OF 12/09/2006  . HIATAL HERNIA 03/01/2010  . History of hiatal hernia   . HYPERLIPIDEMIA 04/18/2007  . HYPERSOMNIA 08/31/2008  .  HYPERTENSION 12/09/2006  . INSOMNIA-SLEEP DISORDER-UNSPEC 11/05/2008  . LOW BACK PAIN 12/09/2006  . Morbid obesity ([place]) 12/09/2006  . Nonspecific (abnormal) findings on radiological and other examination of body structure 11/05/2008  . OSTEOPOROSIS 12/09/2006  . OVERACTIVE BLADDER 04/14/2008  . PALPITATIONS, RECURRENT 01/27/2010  . Stroke [facility])    pt. stated light one years ago  . SYNCOPE 11/05/2008  . TRANSIENT ISCHEMIC ATTACK, HX OF 12/09/2006    Patient Active Problem List   Diagnosis Date Noted  . History of stroke 08/25/2017  . Acute respiratory failure with hypoxia ([place]) 08/25/2017  . Congestive heart failure (CHF) ([place]) 10/05/2016  . COPD exacerbation ([place]) 05/31/2016  . Elevated total protein 05/31/2016  . Chalazion of right upper eyelid 09/16/2015  . Glossitis 08/06/2015  . Tinnitus of right ear 05/05/2015  . Heart murmur 05/04/2015  . Tongue pain 03/25/2014  . Cough 03/02/2014  . Anemia 03/02/2014  . S/P total hip arthroplasty 02/27/2014  . Anxiety state 12/03/2013  . Acute posthemorrhagic anemia 12/12/2012  . Cellulitis 11/12/2012  . Constipation 11/12/2012  . Edema 11/12/2012  . Overactive bladder 11/12/2012  . Candida infection of mouth 11/12/2012  . Hypokalemia 11/07/2012  . OA (osteoarthritis) of hip 11/06/2012  . Dysphagia, unspecified(787.20) 10/23/2012  . Chest pain 10/23/2012  . Dizziness 05/15/2012  . Osteoporosis 05/15/2012  . Peripheral edema 05/15/2012  . DJD (degenerative joint disease) of knee 02/15/2012  . Olecranon bursitis of right elbow 11/23/2011  .  Cervical radiculopathy 09/30/2011  . Gait disorder 09/04/2011  . Preop exam for internal medicine 04/07/2011  . Encounter for preventative adult health care exam with abnormal findings 08/12/2010  . HIATAL HERNIA 03/01/2010  . PALPITATIONS, RECURRENT 01/27/2010  . Chronic pain syndrome 02/16/2009  . Iron deficiency anemia 01/14/2009  . SYNCOPE 11/05/2008  . Insomnia 11/05/2008  . HYPERSOMNIA  08/31/2008  . OVERACTIVE BLADDER 04/14/2008  . Hyperlipidemia 04/18/2007  . Morbid obesity ([place]) 12/09/2006  . Essential hypertension 12/09/2006  . Allergic rhinitis 12/09/2006  . Asthma 12/09/2006  . COPD (chronic obstructive pulmonary disease) ([place]) 12/09/2006  . GERD 12/09/2006  . Osteoarthritis 12/09/2006  . LOW BACK PAIN 12/09/2006  . TRANSIENT ISCHEMIC ATTACK, HX OF 12/09/2006  . HELICOBACTER PYLORI GASTRITIS, HX OF 12/09/2006    Past Surgical History:  Procedure Laterality Date  . COLONOSCOPY    . LUMBAR DISC SURGERY    . TONSILLECTOMY     as a child  . TOTAL HIP ARTHROPLASTY Right 11/06/2012   Procedure: RIGHT TOTAL HIP ARTHROPLASTY WITH ACETABULAR AUTOGRAFT;  Surgeon: Gearlean Alf, MD;  Location: [facility];  Service: Orthopedics;  Laterality: Right;  . TOTAL HIP ARTHROPLASTY Left 02/18/2014   Procedure: LEFT TOTAL HIP ARTHROPLASTY;  Surgeon: Gearlean Alf, MD;  Location: [facility];  Service: Orthopedics;  Laterality: Left;  . TUBAL LIGATION       OB History   None      Home Medications    Prior to Admission medications   Medication Sig Start Date End Date Taking? Authorizing Provider  acetaminophen (TYLENOL) 325 MG tablet Take 2 tablets (650 mg total) by mouth every 6 (six) hours as needed for mild pain (or Fever >/= 101). 02/19/14  Yes Perkins, Alexzandrew L, PA-C  ALPRAZolam (XANAX) 0.25 MG tablet TAKE 1 TABLET BY MOUTH TWICE A DAY FOR ANXIETY 05/02/17  Yes Biagio Borg, MD  amLODipine-benazepril (LOTREL) 10-40 MG capsule TAKE 1 CAPSULE BY MOUTH  DAILY 06/04/17  Yes Biagio Borg, MD  bisacodyl (DULCOLAX) 10 MG suppository Place 1 suppository (10 mg total) rectally daily as needed for moderate constipation. 02/19/14  Yes Perkins, Alexzandrew L, PA-C  budesonide-formoterol (SYMBICORT) 160-4.5 MCG/ACT inhaler Inhale 2 puffs into the lungs 2 (two) times daily. 10/05/16  Yes Biagio Borg, MD  cloNIDine (CATAPRES) 0.3 MG tablet TAKE 1 TABLET BY MOUTH TWO  TIMES DAILY 06/04/17   Yes Biagio Borg, MD  docusate sodium 100 MG CAPS Take 100 mg by mouth 2 (two) times daily. 02/19/14  Yes Perkins, Alexzandrew L, PA-C  furosemide (LASIX) 20 MG tablet TAKE 1 TABLET BY MOUTH  EVERY MORNING 06/04/17  Yes Biagio Borg, MD  gabapentin (NEURONTIN) 300 MG capsule TAKE 1 CAPSULE BY MOUTH 3  TIMES DAILY 06/04/17  Yes Biagio Borg, MD  hydrALAZINE (APRESOLINE) 25 MG tablet Take 1 tablet (25 mg total) by mouth 3 (three) times daily. 10/05/16  Yes Biagio Borg, MD  lovastatin (MEVACOR) 40 MG tablet TAKE 1 TABLET BY MOUTH AT  BEDTIME 05/14/17  Yes Biagio Borg, MD  nystatin (MYCOSTATIN) 100000 UNIT/ML suspension TAKE 1 TEASPOONFUL IN THE MOUTH OR THROAT 4 TIMES A DAY AS NEEDED 08/15/17  Yes Biagio Borg, MD  oxybutynin (DITROPAN) 5 MG tablet TAKE 1 TABLET BY MOUTH 3  TIMES DAILY 06/04/17  Yes Biagio Borg, MD  pantoprazole (PROTONIX) 40 MG tablet TAKE 1 TABLET BY MOUTH  DAILY 06/04/17  Yes Biagio Borg, MD  Polyethyl Glycol-Propyl Glycol (  SYSTANE OP) Place 1 drop into both eyes. Three to four times daily.   Yes [provider]  polyethylene glycol (MIRALAX / GLYCOLAX) packet Take 17 g by mouth daily as needed for mild constipation. 02/19/14  Yes Perkins, Alexzandrew L, PA-C  PROAIR HFA 108 (90 Base) MCG/ACT inhaler TAKE 2 PUFFS BY MOUTH EVERY 6 HOURS AS NEEDED FOR WHEEZE OR SHORTNESS OF BREATH 07/20/17  Yes Biagio Borg, MD  tiZANidine (ZANAFLEX) 4 MG tablet TAKE 1 TABLET (4 MG TOTAL) BY MOUTH EVERY 6 (SIX) HOURS AS NEEDED FOR MUSCLE SPASMS. 05/17/17  Yes Biagio Borg, MD  traMADol (ULTRAM) 50 MG tablet Take 1-2 tablets (50-100 mg total) by mouth every 6 (six) hours as needed. for pain 07/18/17  Yes Biagio Borg, MD  traZODone (DESYREL) 50 MG tablet TAKE 1/2 TO 1 TABLET BY MOUTH AT BEDTIME AS NEEDED FOR SLEEP 02/13/17  Yes Biagio Borg, MD  cetirizine (ZYRTEC) 10 MG tablet TAKE 1 TABLET BY MOUTH EVERY DAY Patient not taking: Reported on 08/25/2017 07/10/17   Biagio Borg, MD    hydrocortisone-pramoxine [facility]) 2.5-1 % rectal cream Apply rectally twice daily. Patient not taking: Reported on 08/25/2017 10/19/14   Hvozdovic, Cecille Rubin P, PA-C  irbesartan (AVAPRO) 300 MG tablet Take 1 tablet (300 mg total) by mouth daily. Patient not taking: Reported on 08/25/2017 12/22/14   Biagio Borg, MD  mometasone-formoterol [facility]) 100-5 MCG/ACT AERO Inhale 2 puffs into the lungs 2 (two) times daily. Patient not taking: Reported on 08/25/2017 03/29/17   Biagio Borg, MD  predniSONE (DELTASONE) 10 MG tablet 3 tabs by mouth per day for 3 days,2tabs per day for 3 days,1tab per day for 3 days Patient not taking: Reported on 08/25/2017 07/18/17   Biagio Borg, MD  lansoprazole (PREVACID) 30 MG capsule Take 1 capsule (30 mg total) by mouth daily. 04/18/11 07/13/11  Biagio Borg, MD    Family History Family History  Problem Relation Age of Onset  . Heart disease Mother        Had pacemaker  . Lung cancer Father   . Hypertension Brother   . Diabetes Neg Hx   . Colon cancer Neg Hx   . Colon polyps Neg Hx   . Kidney disease Neg Hx   . Gallbladder disease Neg Hx   . Esophageal cancer Neg Hx     Social History Social History   Tobacco Use  . Smoking status: Former Smoker    Packs/day: 0.50    Years: 25.00    Pack years: 12.50    Types: Cigarettes    Last attempt to quit: 10/29/1972    Years since quitting: 44.8  . Smokeless tobacco: Never Used  Substance Use Topics  . Alcohol use: No    Alcohol/week: 0.0 oz  . Drug use: No     Allergies   Patient has no known allergies.   Review of Systems Review of Systems  Constitutional: Positive for fatigue. Negative for chills and fever.  HENT: Negative for congestion, rhinorrhea and sore throat.   Eyes: Negative for visual disturbance.  Respiratory: Positive for cough, shortness of breath and wheezing.   Cardiovascular: Negative for chest pain and leg swelling.  Gastrointestinal: Negative for abdominal pain, diarrhea,  nausea and vomiting.  Genitourinary: Negative for dysuria, flank pain, vaginal bleeding and vaginal discharge.  Musculoskeletal: Negative for neck pain.  Skin: Negative for rash.  Allergic/Immunologic: Negative for immunocompromised state.  Neurological: Positive for weakness. Negative  for syncope and headaches.  Hematological: Does not bruise/bleed easily.  All other systems reviewed and are negative.    Physical Exam Updated Vital Signs BP (!) 166/82   Pulse 98   Temp 100.2 F (37.9 C) (Rectal)   Resp (!) 21   Ht 5\' 4"  (1.626 m)   Wt 95.7 kg (211 lb)   SpO2 (!) 89%   BMI 36.22 kg/m   Physical Exam  Constitutional: She is oriented to person, place, and time. She appears well-developed and well-nourished. She appears distressed (Mild respiratory distress with any kind of movement, comfortable at rest however).  HENT:  Head: Normocephalic and atraumatic.  Eyes: Conjunctivae are normal.  Neck: Neck supple.  Cardiovascular: Normal rate, regular rhythm and normal heart sounds. Exam reveals no friction rub.  No murmur heard. Pulmonary/Chest: Effort normal. Tachypnea noted. No respiratory distress. She has decreased breath sounds. She has no wheezes. She has rales in the right lower field and the left lower field.  Abdominal: She exhibits no distension.  Musculoskeletal: She exhibits no edema.  Neurological: She is alert and oriented to person, place, and time. She exhibits normal muscle tone.  Skin: Skin is warm. Capillary refill takes less than 2 seconds.  Psychiatric: She has a normal mood and affect.  Nursing note and vitals reviewed.    ED Treatments / Results  Labs (all labs ordered are listed, but only abnormal results are displayed) Labs Reviewed  CBC - Abnormal; Notable for the following components:      Result Value   WBC 11.7 (*)    MCH 25.3 (*)    RDW 18.2 (*)    Platelets 406 (*)    All other components within normal limits  BASIC METABOLIC PANEL -  Abnormal; Notable for the following components:   Glucose, Bld 114 (*)    GFR calc non Af Amer 54 (*)    All other components within normal limits  URINALYSIS, ROUTINE W REFLEX MICROSCOPIC - Abnormal; Notable for the following components:   Color, Urine STRAW (*)    All other components within normal limits  CULTURE, RESPIRATORY (NON-EXPECTORATED)  CULTURE, BLOOD (ROUTINE X 2)  CULTURE, BLOOD (ROUTINE X 2)  BRAIN NATRIURETIC PEPTIDE  TROPONIN I  INFLUENZA PANEL BY PCR (TYPE A & B)  BASIC METABOLIC PANEL  CBC  I-STAT CG4 LACTIC ACID, ED    EKG EKG Interpretation  Date/Time:  Saturday August 25 2017 12:52:05 EDT Ventricular Rate:  89 PR Interval:    QRS Duration: 86 QT Interval:  353 QTC Calculation: 430 R Axis:   45 Text Interpretation:  Sinus rhythm No significant change since last tracing Confirmed by Duffy Bruce [phone]) on 08/25/2017 1:51:08 PM   Radiology Dg Chest 2 View  Result Date: 08/25/2017 CLINICAL DATA:  Patient reports SOB that has been going on for awhile but has recently gotten worse X a couple of days ago. EXAM: CHEST - 2 VIEW COMPARISON:  09/26/2016 FINDINGS: Cardiac silhouette is mildly enlarged. No mediastinal or hilar masses. No convincing adenopathy. There are prominent lung markings similar to the prior study. No convincing pneumonia. No pulmonary edema. No pleural effusion or pneumothorax. Skeletal structures are demineralized. There old healed rib fractures. IMPRESSION: No acute cardiopulmonary disease. Electronically Signed   By: Lajean Manes M.D.   On: 08/25/2017 14:11    Procedures .Critical Care Performed by: Duffy Bruce, MD Authorized by: Duffy Bruce, MD   Critical care provider statement:    Critical care time (minutes):  35  Critical care time was exclusive of:  Separately billable procedures and treating other patients and teaching time   Critical care was necessary to treat or prevent imminent or life-threatening deterioration of  the following conditions:  Respiratory failure   Critical care was time spent personally by me on the following activities:  Development of treatment plan with patient or surrogate, discussions with consultants, evaluation of patient's response to treatment, examination of patient, obtaining history from patient or surrogate, ordering and performing treatments and interventions, ordering and review of laboratory studies, ordering and review of radiographic studies, pulse oximetry, re-evaluation of patient's condition and review of old charts   I assumed direction of critical care for this patient from another provider in my specialty: no     (including critical care time)  Medications Ordered in ED Medications  ALPRAZolam (XANAX) tablet 0.25 mg (has no administration in time range)  cloNIDine (CATAPRES) tablet 0.3 mg (0.3 mg Oral Given 08/25/17 1707)  furosemide (LASIX) tablet 20 mg (20 mg Oral Given 08/25/17 1707)  gabapentin (NEURONTIN) capsule 300 mg (300 mg Oral Given 08/25/17 1700)  hydrALAZINE (APRESOLINE) tablet 25 mg (25 mg Oral Given 08/25/17 1700)  pravastatin (PRAVACHOL) tablet 40 mg (40 mg Oral Given 08/25/17 1712)  oxybutynin (DITROPAN) tablet 5 mg (5 mg Oral Given 08/25/17 1700)  pantoprazole (PROTONIX) EC tablet 40 mg (40 mg Oral Given 08/25/17 1707)  polyvinyl alcohol (LIQUIFILM TEARS) 1.4 % ophthalmic solution 1 drop (has no administration in time range)  polyethylene glycol (MIRALAX / GLYCOLAX) packet 17 g (has no administration in time range)  tiZANidine (ZANAFLEX) tablet 4 mg (has no administration in time range)  traMADol (ULTRAM) tablet 50-100 mg (has no administration in time range)  acetaminophen (TYLENOL) tablet 650 mg (has no administration in time range)    Or  acetaminophen (TYLENOL) suppository 650 mg (has no administration in time range)  senna-docusate (Senokot-S) tablet 1 tablet (has no administration in time range)  ondansetron (ZOFRAN) tablet 4 mg (has no  administration in time range)    Or  ondansetron (ZOFRAN) injection 4 mg (has no administration in time range)  enoxaparin (LOVENOX) injection 40 mg (has no administration in time range)  albuterol (PROVENTIL) (2.5 MG/3ML) 0.083% nebulizer solution 5 mg (has no administration in time range)  ipratropium-albuterol (DUONEB) 0.5-2.5 (3) MG/3ML nebulizer solution 3 mL (has no administration in time range)  methylPREDNISolone sodium succinate (SOLU-MEDROL) 40 mg/mL injection 40 mg (has no administration in time range)  guaiFENesin (MUCINEX) 12 hr tablet 600 mg (has no administration in time range)  amLODipine (NORVASC) tablet 10 mg (10 mg Oral Given 08/25/17 1707)  benazepril (LOTENSIN) tablet 40 mg (has no administration in time range)  doxycycline (VIBRAMYCIN) 100 mg in sodium chloride 0.9 % 250 mL IVPB (has no administration in time range)  hydrALAZINE (APRESOLINE) injection 10 mg (has no administration in time range)  sodium chloride 0.9 % bolus 1,000 mL (0 mLs Intravenous Stopped 08/25/17 1351)  methylPREDNISolone sodium succinate (SOLU-MEDROL) 125 mg/2 mL injection 125 mg (125 mg Intravenous Given 08/25/17 1256)  albuterol (PROVENTIL) (2.5 MG/3ML) 0.083% nebulizer solution 5 mg (5 mg Nebulization Given 08/25/17 1255)  ipratropium (ATROVENT) nebulizer solution 0.5 mg (0.5 mg Nebulization Given 08/25/17 1255)  doxycycline (VIBRAMYCIN) 100 mg in sodium chloride 0.9 % 250 mL IVPB (0 mg Intravenous Stopped 08/25/17 1657)  ipratropium-albuterol (DUONEB) 0.5-2.5 (3) MG/3ML nebulizer solution 3 mL (3 mLs Nebulization Given 08/25/17 1442)     Initial Impression / Assessment and Plan / ED Course  I have reviewed the triage vital signs and the nursing notes.  Pertinent labs & imaging results that were available during my care of the patient were reviewed by me and considered in my medical decision making (see chart for details).     77 yo F here with cough, SOB, hypoxia. CXR without focal PNA. Lab work  shows moderate leukocytosis, is o/w unremarkable. LA normal. No signs of sepsis. Suspect COPD exacerbation/bronchitis. Pt given steroids, nebs with improvement. Doxy given 2/2 increased yellow-green sputum production. She is persistently hypoxic. Will admit to medicine.  Final Clinical Impressions(s) / ED Diagnoses   Final diagnoses:  COPD exacerbation [facility])    ED Discharge Orders    None       Duffy Bruce, MD 08/25/17 Jeb Levering    Duffy Bruce, MD 08/25/17 Nicholos Johns, MD 09/03/17 [phone]

## 2017-08-25 NOTE — ED Notes (Signed)
RN took report but will not come get patient until next shift.

## 2017-08-25 NOTE — H&P (Signed)
History and Physical    Wendy Torres QHU:[id number] DOB: 08-19-1940 DOA: 08/25/2017   PCP: Biagio Borg, MD   Patient coming from:  Home    Chief Complaint: Shortness of breath and cough  HPI: Wendy Torres is a 77 y.o. female with medical history significant for COPD not on home oxygen, hypertension, hyperlipidemia, anxiety, arthritis, peripheral neuropathy, overactive bladder, presenting today with generalized weakness, myalgia, increasing shortness of breath, as well as yellow-green sputum with cough.  Patient states that he has been symptomatic over the last 2 days, after sick contact by a family member who was experiencing similar symptoms. Denies rhinorrhea or hemoptysis. Denies subjective fevers, chills, night sweats or mucositis. Denies any chest pain, chest wall pain or palpitations.Denies any  long distant travels. Denies any abdominal pain, nausea or vomiting. Denies nausea without vomiting. Denies dizziness or vertigo. Denies lower extremity swelling. No confusion was reported. Denies any vision changes, double vision or headaches.  Denies any tobacco, alcohol or recreational drug use.  She is compliant with her medications.  ED Course:  BP (!) 198/92   Pulse 100   Temp 100.2 F (37.9 C) (Rectal)   Resp (!) 26   Ht 5\' 4"  (1.626 m)   Wt 95.7 kg (211 lb)   SpO2 96%   BMI 36.22 kg/m   White count 11.7. Glucose 114 Urine negative Lactic acid 1.68 Chest x-ray NAD, prominent lung markings similar to the prior study, no pneumonia.  No pulmonary edema. Sputum culture is pending. Last 2D echo in February 2017, EF 60 to 46%, normal systolic. Patient was started on doxycycline IV, as well as Solu-Medrol 125 mg IV x1, and given nebulizer treatment with DuoNeb and albuterol and ipratropium.  She responded well to the medication.  In addition, because her O2 sats were in the 80s, she was placed on 2 L of oxygen, with improvement of her respiratory status.  She already reports feeling  better. Influenza panel is pending. BNP normal 30.3  EKG SR no acute findings   Review of Systems:  As per HPI otherwise all other systems reviewed and are negative  Past Medical History:  Diagnosis Date  . ABNORMAL CHEST XRAY 11/05/2008  . ACUTE URIS OF UNSPECIFIED SITE 10/22/2007  . ALLERGIC RHINITIS 12/09/2006  . ANEMIA 01/14/2009  . Anxiety   . Arthritis   . ASTHMA 12/09/2006  . Blood transfusion without reported diagnosis   . Cataract    bilateral -removed  . CHF (congestive heart failure) ([place])   . Chronic pain syndrome 02/16/2009  . COPD 12/09/2006  . DEGENERATIVE JOINT DISEASE 12/09/2006  . DIZZINESS 03/30/2010  . DYSPNEA 01/27/2010   with heavy exertion  . FREQUENCY, URINARY 03/30/2010  . GERD 12/09/2006  . Glossitis 01/14/2009  . HELICOBACTER PYLORI GASTRITIS, HX OF 12/09/2006  . HIATAL HERNIA 03/01/2010  . History of hiatal hernia   . HYPERLIPIDEMIA 04/18/2007  . HYPERSOMNIA 08/31/2008  . HYPERTENSION 12/09/2006  . INSOMNIA-SLEEP DISORDER-UNSPEC 11/05/2008  . LOW BACK PAIN 12/09/2006  . Morbid obesity ([place]) 12/09/2006  . Nonspecific (abnormal) findings on radiological and other examination of body structure 11/05/2008  . OSTEOPOROSIS 12/09/2006  . OVERACTIVE BLADDER 04/14/2008  . PALPITATIONS, RECURRENT 01/27/2010  . Stroke [facility])    pt. stated light one years ago  . SYNCOPE 11/05/2008  . TRANSIENT ISCHEMIC ATTACK, HX OF 12/09/2006    Past Surgical History:  Procedure Laterality Date  . COLONOSCOPY    . LUMBAR DISC SURGERY    .  TONSILLECTOMY     as a child  . TOTAL HIP ARTHROPLASTY Right 11/06/2012   Procedure: RIGHT TOTAL HIP ARTHROPLASTY WITH ACETABULAR AUTOGRAFT;  Surgeon: Gearlean Alf, MD;  Location: [facility];  Service: Orthopedics;  Laterality: Right;  . TOTAL HIP ARTHROPLASTY Left 02/18/2014   Procedure: LEFT TOTAL HIP ARTHROPLASTY;  Surgeon: Gearlean Alf, MD;  Location: [facility];  Service: Orthopedics;  Laterality: Left;  . TUBAL LIGATION      Social  History Social History   Socioeconomic History  . Marital status: Married    Spouse name: Not on file  . Number of children: 10  . Years of education: Not on file  . Highest education level: Not on file  Occupational History  . Occupation: Retired  Scientific laboratory technician  . Financial resource strain: Not on file  . Food insecurity:    Worry: Not on file    Inability: Not on file  . Transportation needs:    Medical: Not on file    Non-medical: Not on file  Tobacco Use  . Smoking status: Former Smoker    Packs/day: 0.50    Years: 25.00    Pack years: 12.50    Types: Cigarettes    Last attempt to quit: 10/29/1972    Years since quitting: 44.8  . Smokeless tobacco: Never Used  Substance and Sexual Activity  . Alcohol use: No    Alcohol/week: 0.0 oz  . Drug use: No  . Sexual activity: Not on file  Lifestyle  . Physical activity:    Days per week: Not on file    Minutes per session: Not on file  . Stress: Not on file  Relationships  . Social connections:    Talks on phone: Not on file    Gets together: Not on file    Attends religious service: Not on file    Active member of club or organization: Not on file    Attends meetings of clubs or organizations: Not on file    Relationship status: Not on file  . Intimate partner violence:    Fear of current or ex partner: Not on file    Emotionally abused: Not on file    Physically abused: Not on file    Forced sexual activity: Not on file  Other Topics Concern  . Not on file  Social History Narrative  . Not on file     No Known Allergies  Family History  Problem Relation Age of Onset  . Heart disease Mother        Had pacemaker  . Lung cancer Father   . Hypertension Brother   . Diabetes Neg Hx   . Colon cancer Neg Hx   . Colon polyps Neg Hx   . Kidney disease Neg Hx   . Gallbladder disease Neg Hx   . Esophageal cancer Neg Hx       Prior to Admission medications   Medication Sig Start Date End Date Taking?  Authorizing Provider  acetaminophen (TYLENOL) 325 MG tablet Take 2 tablets (650 mg total) by mouth every 6 (six) hours as needed for mild pain (or Fever >/= 101). 02/19/14  Yes Perkins, Alexzandrew L, PA-C  ALPRAZolam (XANAX) 0.25 MG tablet TAKE 1 TABLET BY MOUTH TWICE A DAY FOR ANXIETY 05/02/17  Yes Biagio Borg, MD  amLODipine-benazepril (LOTREL) 10-40 MG capsule TAKE 1 CAPSULE BY MOUTH  DAILY 06/04/17  Yes Biagio Borg, MD  bisacodyl (DULCOLAX) 10 MG suppository Place 1 suppository (  10 mg total) rectally daily as needed for moderate constipation. 02/19/14  Yes Perkins, Alexzandrew L, PA-C  budesonide-formoterol (SYMBICORT) 160-4.5 MCG/ACT inhaler Inhale 2 puffs into the lungs 2 (two) times daily. 10/05/16  Yes Biagio Borg, MD  cloNIDine (CATAPRES) 0.3 MG tablet TAKE 1 TABLET BY MOUTH TWO  TIMES DAILY 06/04/17  Yes Biagio Borg, MD  docusate sodium 100 MG CAPS Take 100 mg by mouth 2 (two) times daily. 02/19/14  Yes Perkins, Alexzandrew L, PA-C  furosemide (LASIX) 20 MG tablet TAKE 1 TABLET BY MOUTH  EVERY MORNING 06/04/17  Yes Biagio Borg, MD  gabapentin (NEURONTIN) 300 MG capsule TAKE 1 CAPSULE BY MOUTH 3  TIMES DAILY 06/04/17  Yes Biagio Borg, MD  hydrALAZINE (APRESOLINE) 25 MG tablet Take 1 tablet (25 mg total) by mouth 3 (three) times daily. 10/05/16  Yes Biagio Borg, MD  lovastatin (MEVACOR) 40 MG tablet TAKE 1 TABLET BY MOUTH AT  BEDTIME 05/14/17  Yes Biagio Borg, MD  nystatin (MYCOSTATIN) 100000 UNIT/ML suspension TAKE 1 TEASPOONFUL IN THE MOUTH OR THROAT 4 TIMES A DAY AS NEEDED 08/15/17  Yes Biagio Borg, MD  oxybutynin (DITROPAN) 5 MG tablet TAKE 1 TABLET BY MOUTH 3  TIMES DAILY 06/04/17  Yes Biagio Borg, MD  pantoprazole (PROTONIX) 40 MG tablet TAKE 1 TABLET BY MOUTH  DAILY 06/04/17  Yes Biagio Borg, MD  Polyethyl Glycol-Propyl Glycol (SYSTANE OP) Place 1 drop into both eyes. Three to four times daily.   Yes [provider]  polyethylene glycol (MIRALAX / GLYCOLAX) packet Take 17 g  by mouth daily as needed for mild constipation. 02/19/14  Yes Perkins, Alexzandrew L, PA-C  PROAIR HFA 108 (90 Base) MCG/ACT inhaler TAKE 2 PUFFS BY MOUTH EVERY 6 HOURS AS NEEDED FOR WHEEZE OR SHORTNESS OF BREATH 07/20/17  Yes Biagio Borg, MD  tiZANidine (ZANAFLEX) 4 MG tablet TAKE 1 TABLET (4 MG TOTAL) BY MOUTH EVERY 6 (SIX) HOURS AS NEEDED FOR MUSCLE SPASMS. 05/17/17  Yes Biagio Borg, MD  traMADol (ULTRAM) 50 MG tablet Take 1-2 tablets (50-100 mg total) by mouth every 6 (six) hours as needed. for pain 07/18/17  Yes Biagio Borg, MD  traZODone (DESYREL) 50 MG tablet TAKE 1/2 TO 1 TABLET BY MOUTH AT BEDTIME AS NEEDED FOR SLEEP 02/13/17  Yes Biagio Borg, MD  cetirizine (ZYRTEC) 10 MG tablet TAKE 1 TABLET BY MOUTH EVERY DAY Patient not taking: Reported on 08/25/2017 07/10/17   Biagio Borg, MD  hydrocortisone-pramoxine [facility]) 2.5-1 % rectal cream Apply rectally twice daily. Patient not taking: Reported on 08/25/2017 10/19/14   Hvozdovic, Cecille Rubin P, PA-C  irbesartan (AVAPRO) 300 MG tablet Take 1 tablet (300 mg total) by mouth daily. Patient not taking: Reported on 08/25/2017 12/22/14   Biagio Borg, MD  mometasone-formoterol [facility]) 100-5 MCG/ACT AERO Inhale 2 puffs into the lungs 2 (two) times daily. Patient not taking: Reported on 08/25/2017 03/29/17   Biagio Borg, MD  predniSONE (DELTASONE) 10 MG tablet 3 tabs by mouth per day for 3 days,2tabs per day for 3 days,1tab per day for 3 days Patient not taking: Reported on 08/25/2017 07/18/17   Biagio Borg, MD  lansoprazole (PREVACID) 30 MG capsule Take 1 capsule (30 mg total) by mouth daily. 04/18/11 07/13/11  Biagio Borg, MD    Physical Exam:  Vitals:   08/25/17 1215 08/25/17 1230 08/25/17 1233 08/25/17 1330  BP: (!) 174/80 (!) 182/95  Marland Kitchen)  198/92  Pulse: 91 92  100  Resp: (!) 23 20  (!) 26  Temp:   100.2 F (37.9 C)   TempSrc:   Rectal   SpO2: 98% 98%  96%  Weight:      Height:       Constitutional: NAD, calm, comfortable Eyes:  PERRL, lids and conjunctivae normal ENMT: Mucous membranes are moist, without exudate or lesions  Neck: normal, supple, no masses, no thyromegaly Respiratory: Essentially clear to auscultation bilaterally, minimal wheezing, no crackles. Normal respiratory effort  Cardiovascular: Regular rate and rhythm, 2 out of 6 murmur, rubs or gallops. No extremity edema. 2+ pedal pulses. No carotid bruits.  Abdomen: Soft, non tender, No hepatosplenomegaly. Bowel sounds positive.  Musculoskeletal: no clubbing / cyanosis. Moves all extremities Skin: no jaundice, No lesions.  Neurologic: Sensation intact  Strength equal in all extremities Psychiatric:   Alert and oriented x 3. Normal mood.    Labs on Admission: I have personally reviewed following labs and imaging studies  CBC: Recent Labs  Lab 08/25/17 0835  WBC 11.7*  HGB 12.1  HCT 38.0  MCV 79.3  PLT 406*    Basic Metabolic Panel: Recent Labs  Lab 08/25/17 0835  NA 137  K 4.0  CL 101  CO2 26  GLUCOSE 114*  BUN 7  CREATININE 0.98  CALCIUM 10.0    GFR: Estimated Creatinine Clearance: 53.1 mL/min (by C-G formula based on SCr of 0.98 mg/dL).  Liver Function Tests: No results for input(s): AST, ALT, ALKPHOS, BILITOT, PROT, ALBUMIN in the last 168 hours. No results for input(s): LIPASE, AMYLASE in the last 168 hours. No results for input(s): AMMONIA in the last 168 hours.  Coagulation Profile: No results for input(s): INR, PROTIME in the last 168 hours.  Cardiac Enzymes: Recent Labs  Lab 08/25/17 1247  TROPONINI <0.03    BNP (last 3 results) Recent Labs    10/05/16 1443  PROBNP 18.0    HbA1C: No results for input(s): HGBA1C in the last 72 hours.  CBG: No results for input(s): GLUCAP in the last 168 hours.  Lipid Profile: No results for input(s): CHOL, HDL, LDLCALC, TRIG, CHOLHDL, LDLDIRECT in the last 72 hours.  Thyroid Function Tests: No results for input(s): TSH, T4TOTAL, FREET4, T3FREE, THYROIDAB in the last  72 hours.  Anemia Panel: No results for input(s): VITAMINB12, FOLATE, FERRITIN, TIBC, IRON, RETICCTPCT in the last 72 hours.  Urine analysis:    Component Value Date/Time   COLORURINE STRAW (A) 08/25/2017 1518   APPEARANCEUR CLEAR 08/25/2017 1518   LABSPEC 1.005 08/25/2017 1518   PHURINE 8.0 08/25/2017 1518   GLUCOSEU NEGATIVE 08/25/2017 1518   GLUCOSEU NEGATIVE 07/18/2017 1212   HGBUR NEGATIVE 08/25/2017 1518   BILIRUBINUR NEGATIVE 08/25/2017 1518   KETONESUR NEGATIVE 08/25/2017 1518   PROTEINUR NEGATIVE 08/25/2017 1518   UROBILINOGEN 0.2 07/18/2017 1212   NITRITE NEGATIVE 08/25/2017 1518   LEUKOCYTESUR NEGATIVE 08/25/2017 1518    Sepsis Labs: @LABRCNTIP (procalcitonin:4,lacticidven:4) )No results found for this or any previous visit (from the past 240 hour(s)).   Radiological Exams on Admission: Dg Chest 2 View  Result Date: 08/25/2017 CLINICAL DATA:  Patient reports SOB that has been going on for awhile but has recently gotten worse X a couple of days ago. EXAM: CHEST - 2 VIEW COMPARISON:  09/26/2016 FINDINGS: Cardiac silhouette is mildly enlarged. No mediastinal or hilar masses. No convincing adenopathy. There are prominent lung markings similar to the prior study. No convincing pneumonia. No pulmonary edema. No pleural  effusion or pneumothorax. Skeletal structures are demineralized. There old healed rib fractures. IMPRESSION: No acute cardiopulmonary disease. Electronically Signed   By: Lajean Manes M.D.   On: 08/25/2017 14:11    EKG: Independently reviewed.  Assessment/Plan Active Problems:   Hyperlipidemia   Morbid obesity (HCC)   Iron deficiency anemia   Chronic pain syndrome   Essential hypertension   Asthma   COPD (chronic obstructive pulmonary disease) (HCC)   GERD   OVERACTIVE BLADDER   Osteoarthritis   LOW BACK PAIN   Cervical radiculopathy   COPD exacerbation (HCC)   Congestive heart failure (CHF) (HCC)   History of stroke   Acute respiratory failure  with hypoxia (HCC)    Acute hypoxic respiratory failure likely secondary to acute COPD exacerbation with possible bronchitis. Tmax 100.2 rectal  White count 11.7.Chest x-ray NAD, prominent lung markings similar to the prior study, no pneumonia.  No pulmonary edema Sputum culture and influenza panel  is pending.Lactic acid 1.68. Patient was started on doxycycline IV, as well as Solu-Medrol 125 mg IV x1, and given nebulizer treatment with DuoNeb and albuterol and ipratropium.  She responded well to the medication.  In addition, because her O2 sats were in the 80s, she was placed on 2 L of oxygen, with improvement of her respiratory status.  She already reports feeling better.  Admit to Medsurg obs  Doxycycline IV  Duonebs and Albuterol    Steroids with Solumedrol 40 mg IV bid  Protonix  due to steroids  Mucinex prn to loosen mucus  Oxygen CBC in am Incentive spirometry  Diastolic CHF: No acute decompensation weight 211 lbs  Last 2D echo in February 2017, EF 60 to 20%, normal systolic. EKG SR no acute findings. Tn neg  BNP normal 30.3  Continue meds in am  Obtain daily weights Monitor intake and output   Hypertension BP  198/92   Pulse 100   Continue home anti-hypertensive medications    Hyperlipidemia Continue home statins  GERD, no acute symptoms Continue PPI  History of overactive bladder Continue Ditropan   Anxiety Continue home Xanax   DVT prophylaxis: Lovenox Code Status:    Full Family Communication:  Discussed with patient Disposition Plan: Expect patient to be discharged to home after condition improves Consults called:    None Admission status: MedSurg observation   Sharene Butters, PA-C [facility]   Amion text  [phone]   08/25/2017, 4:19 PM

## 2017-08-25 NOTE — ED Triage Notes (Signed)
IV 20 g. Left hand

## 2017-08-25 NOTE — ED Notes (Signed)
Ordered pt dinner tray.

## 2017-08-25 NOTE — ED Triage Notes (Signed)
EMS stated, they were called out for knee, back and headache. Pt. Is HTN

## 2017-08-25 NOTE — ED Notes (Signed)
Pt states she does not usually use oxygen at home.

## 2017-08-25 NOTE — ED Triage Notes (Signed)
Pt. Stated, My symptoms started yesterday and I decided to come in today. I go to Dr. Jenny Reichmann

## 2017-08-26 ENCOUNTER — Other Ambulatory Visit: Payer: Self-pay

## 2017-08-26 DIAGNOSIS — I10 Essential (primary) hypertension: Secondary | ICD-10-CM | POA: Diagnosis not present

## 2017-08-26 DIAGNOSIS — J441 Chronic obstructive pulmonary disease with (acute) exacerbation: Secondary | ICD-10-CM | POA: Diagnosis not present

## 2017-08-26 DIAGNOSIS — I5032 Chronic diastolic (congestive) heart failure: Secondary | ICD-10-CM

## 2017-08-26 DIAGNOSIS — J9601 Acute respiratory failure with hypoxia: Secondary | ICD-10-CM | POA: Diagnosis not present

## 2017-08-26 LAB — BASIC METABOLIC PANEL
Anion gap: 10 (ref 5–15)
BUN: 11 mg/dL (ref 6–20)
CHLORIDE: 104 mmol/L (ref 101–111)
CO2: 24 mmol/L (ref 22–32)
Calcium: 9.7 mg/dL (ref 8.9–10.3)
Creatinine, Ser: 0.87 mg/dL (ref 0.44–1.00)
GFR calc Af Amer: >60 mL/min (ref >60)
GFR calc non Af Amer: >60 mL/min (ref >60)
Glucose, Bld: 132 mg/dL — ABNORMAL HIGH (ref 65–99)
POTASSIUM: 4 mmol/L (ref 3.5–5.1)
Sodium: 138 mmol/L (ref 135–145)

## 2017-08-26 LAB — CBC
HEMATOCRIT: 34.6 % — AB (ref 36.0–46.0)
Hemoglobin: 10.8 g/dL — ABNORMAL LOW (ref 12.0–15.0)
MCH: 24.4 pg — AB (ref 26.0–34.0)
MCHC: 31.2 g/dL (ref 30.0–36.0)
MCV: 78.1 fL (ref 78.0–100.0)
PLATELETS: 399 10*3/uL (ref 150–400)
RBC: 4.43 MIL/uL (ref 3.87–5.11)
RDW: 17.9 % — AB (ref 11.5–15.5)
WBC: 10.3 10*3/uL (ref 4.0–10.5)

## 2017-08-26 LAB — CULTURE, RESPIRATORY

## 2017-08-26 LAB — CULTURE, RESPIRATORY W GRAM STAIN

## 2017-08-26 MED ORDER — HYDRALAZINE HCL 50 MG PO TABS
50.0000 mg | ORAL_TABLET | Freq: Three times a day (TID) | ORAL | Status: DC
  Administered 2017-08-26 – 2017-08-27 (×4): 50 mg via ORAL
  Filled 2017-08-26 (×4): qty 1

## 2017-08-26 NOTE — Plan of Care (Signed)
  Problem: Clinical Measurements: Goal: Diagnostic test results will improve Outcome: Progressing   Problem: Clinical Measurements: Goal: Respiratory complications will improve Outcome: Progressing   Problem: Activity: Goal: Risk for activity intolerance will decrease Outcome: Progressing   Problem: Activity: Goal: Risk for activity intolerance will decrease Outcome: Progressing

## 2017-08-26 NOTE — Progress Notes (Signed)
Patient ID: Wendy Torres, female   DOB: [birth year], 77 y.o.   MRN: [id number]                                                                PROGRESS NOTE                                                                                                                                                                                                             Patient Demographics:    Wendy Torres, is a 77 y.o. female, DOB - [birth year], CBJ:[id number]  Admit date - 08/25/2017   Admitting Physician Jani Gravel, MD  Outpatient Primary MD for the patient is Biagio Borg, MD  LOS - 1  Outpatient Specialists:     Chief Complaint  Patient presents with  . Headache  . Back Pain  . Hypertension       Brief Narrative    77 y.o. female with medical history significant for COPD not on home oxygen, hypertension, hyperlipidemia, anxiety, arthritis, peripheral neuropathy, overactive bladder, presenting today with generalized weakness, myalgia, increasing shortness of breath, as well as yellow-green sputum with cough.  Patient states that he has been symptomatic over the last 2 days, after sick contact by a family member who was experiencing similar symptoms. Denies rhinorrhea or hemoptysis. Denies subjective fevers, chills, night sweats or mucositis. Denies any chest pain, chest wall pain or palpitations.Denies any  long distant travels. Denies any abdominal pain, nausea or vomiting. Denies nausea without vomiting. Denies dizziness or vertigo. Denies lower extremity swelling. No confusion was reported. Denies any vision changes, double vision or headaches.  Denies any tobacco, alcohol or recreational drug use.  She is compliant with her medications.  ED Course:  BP (!) 198/92   Pulse 100   Temp 100.2 F (37.9 C) (Rectal)   Resp (!) 26   Ht 5\' 4"  (1.626 m)   Wt 95.7 kg (211 lb)   SpO2 96%   BMI 36.22 kg/m   White count 11.7. Glucose 114 Urine negative Lactic acid 1.68 Chest x-ray NAD, prominent  lung markings similar to the prior study, no pneumonia.  No pulmonary edema. Sputum culture is pending. Last 2D echo in February 2017, EF 60 to 16%, normal systolic. Patient was started on doxycycline IV, as well  as Solu-Medrol 125 mg IV x1, and given nebulizer treatment with DuoNeb and albuterol and ipratropium.  She responded well to the medication.  In addition, because her O2 sats were in the 80s, she was placed on 2 L of oxygen, with improvement of her respiratory status.  She already reports feeling better. Influenza panel is pending. BNP normal 30.3  EKG SR no acute findings       Subjective:    Wendy Torres today is feeling better.  Breathing has improved on solumedrol.  Pt is tolerating doxycycline.  Still slight cough but not productive.  Afebrile overnite.      No headache, No chest pain, No abdominal pain - No Nausea, No new weakness tingling or numbness.     Assessment  & Plan :    Active Problems:   Hyperlipidemia   Morbid obesity (HCC)   Iron deficiency anemia   Chronic pain syndrome   Essential hypertension   Asthma   COPD (chronic obstructive pulmonary disease) (HCC)   GERD   OVERACTIVE BLADDER   Osteoarthritis   LOW BACK PAIN   Cervical radiculopathy   COPD exacerbation (HCC)   Congestive heart failure (CHF) (HCC)   History of stroke   Acute respiratory failure with hypoxia (HCC)    Acute hypoxic respiratory failure likely secondary to acute COPD exacerbation with possible bronchitis.  Cont Doxycycline IV  Cont Duonebs and Albuterol   Cont Solumedrol 40 mg IV bid  Cont Protonix  due to steroids Cont Mucinex prn to loosen mucus  Oxygen   Diastolic CHF: No acute decompensation weight 211 lbs  Last 2D echo in February 2017, EF 60 to 91%, normal systolic. EKG SR no acute findings. Tn neg  BNP normal 30.3  Continue home medications Obtain daily weights Monitor intake and output  Hypertension BP  198/92   Pulse 100  , improved Continue home  anti-hypertensive medications  Will try to increase hydralazine to 50mg  po tid for better bp control   Hyperlipidemia Continue home statins  GERD, no acute symptoms Continue PPI  History of overactive bladder Continue Ditropan   Anxiety Continue home Xanax   DVT prophylaxis: Lovenox Code Status:    Full Family Communication:  Discussed with patient Disposition Plan: Expect patient to be discharged to home after condition improves Consults called:    None Admission status: MedSurg  inpatient     Lab Results  Component Value Date   PLT 399 08/26/2017    Antibiotics  :  Doxycycline 4/27=>  Anti-infectives (From admission, onward)   Start     Dose/Rate Route Frequency Ordered Stop   08/26/17 1600  doxycycline (VIBRAMYCIN) 100 mg in sodium chloride 0.9 % 250 mL IVPB     100 mg 125 mL/hr over 120 Minutes Intravenous Every 24 hours 08/25/17 1614     08/25/17 1445  doxycycline (VIBRAMYCIN) 100 mg in sodium chloride 0.9 % 250 mL IVPB     100 mg 125 mL/hr over 120 Minutes Intravenous  Once 08/25/17 1419 08/25/17 1657        Objective:   Vitals:   08/25/17 1900 08/25/17 2044 08/25/17 2104 08/25/17 2230  BP: (!) 196/88 (!) 183/84  (!) 154/81  Pulse: 97 (!) 107  (!) 104  Resp:  (!) 23    Temp:  98.7 F (37.1 C)    TempSrc:  Oral    SpO2: (!) 89% 98%    Weight:   95.8 kg (211 lb 1.6 oz)   Height:  5\' 4"  (1.626 m)     Wt Readings from Last 3 Encounters:  08/25/17 95.8 kg (211 lb 1.6 oz)  07/18/17 97.1 kg (214 lb)  10/05/16 94.3 kg (208 lb)     Intake/Output Summary (Last 24 hours) at 08/26/2017 0938 Last data filed at 08/26/2017 0719 Gross per 24 hour  Intake 1490 ml  Output 1150 ml  Net 340 ml     Physical Exam  Awake Alert, Oriented X 3, No new F.N deficits, Normal affect [place].AT,PERRAL Supple Neck,No JVD, No cervical lymphadenopathy appriciated.  Symmetrical Chest wall movement, , slight wheezing, slightly tight, no crackles.  RRR,No  Gallops,Rubs or new Murmurs, No Parasternal Heave +ve B.Sounds, Abd Soft, No tenderness, No organomegaly appriciated, No rebound - guarding or rigidity. No Cyanosis, Clubbing or edema, No new Rash or bruise     Data Review:    CBC Recent Labs  Lab 08/25/17 0835 08/26/17 0518  WBC 11.7* 10.3  HGB 12.1 10.8*  HCT 38.0 34.6*  PLT 406* 399  MCV 79.3 78.1  MCH 25.3* 24.4*  MCHC 31.8 31.2  RDW 18.2* 17.9*    Chemistries  Recent Labs  Lab 08/25/17 0835 08/26/17 0518  NA 137 138  K 4.0 4.0  CL 101 104  CO2 26 24  GLUCOSE 114* 132*  BUN 7 11  CREATININE 0.98 0.87  CALCIUM 10.0 9.7   ------------------------------------------------------------------------------------------------------------------ No results for input(s): CHOL, HDL, LDLCALC, TRIG, CHOLHDL, LDLDIRECT in the last 72 hours.  No results found for: HGBA1C ------------------------------------------------------------------------------------------------------------------ No results for input(s): TSH, T4TOTAL, T3FREE, THYROIDAB in the last 72 hours.  Invalid input(s): FREET3 ------------------------------------------------------------------------------------------------------------------ No results for input(s): VITAMINB12, FOLATE, FERRITIN, TIBC, IRON, RETICCTPCT in the last 72 hours.  Coagulation profile No results for input(s): INR, PROTIME in the last 168 hours.  No results for input(s): DDIMER in the last 72 hours.  Cardiac Enzymes Recent Labs  Lab 08/25/17 1247  TROPONINI <0.03   ------------------------------------------------------------------------------------------------------------------    Component Value Date/Time   BNP 30.3 08/25/2017 1250    Inpatient Medications  Scheduled Meds: . amLODipine  10 mg Oral Daily  . benazepril  40 mg Oral Daily  . cloNIDine  0.3 mg Oral BID  . enoxaparin (LOVENOX) injection  40 mg Subcutaneous Q24H  . furosemide  20 mg Oral q morning - 10a  .  gabapentin  300 mg Oral TID  . hydrALAZINE  25 mg Oral TID  . methylPREDNISolone (SOLU-MEDROL) injection  40 mg Intravenous Q12H  . oxybutynin  5 mg Oral TID  . pantoprazole  40 mg Oral Daily  . pravastatin  40 mg Oral q1800   Continuous Infusions: . doxycycline (VIBRAMYCIN) IV     PRN Meds:.acetaminophen **OR** acetaminophen, albuterol, ALPRAZolam, guaiFENesin, hydrALAZINE, ipratropium-albuterol, ondansetron **OR** ondansetron (ZOFRAN) IV, polyethylene glycol, polyvinyl alcohol, senna-docusate, tiZANidine, traMADol  Micro Results Recent Results (from the past 240 hour(s))  Culture, respiratory (NON-Expectorated)     Status: None (Preliminary result)   Collection Time: 08/25/17  1:51 PM  Result Value Ref Range Status   Specimen Description EXPECTORATED SPUTUM  Final   Special Requests NONE  Final   Gram Stain   Final    MODERATE WBC PRESENT, PREDOMINANTLY PMN RARE SQUAMOUS EPITHELIAL CELLS PRESENT ABUNDANT GRAM NEGATIVE COCCOBACILLI RARE GRAM POSITIVE COCCI IN PAIRS Performed at [facility], [place] [address], [place], [place] [address]    Culture PENDING  Incomplete   Report Status PENDING  Incomplete    Radiology Reports Dg Chest 2 View  Result Date: 08/25/2017 CLINICAL DATA:  Patient reports SOB that has been going on for awhile but has recently gotten worse X a couple of days ago. EXAM: CHEST - 2 VIEW COMPARISON:  09/26/2016 FINDINGS: Cardiac silhouette is mildly enlarged. No mediastinal or hilar masses. No convincing adenopathy. There are prominent lung markings similar to the prior study. No convincing pneumonia. No pulmonary edema. No pleural effusion or pneumothorax. Skeletal structures are demineralized. There old healed rib fractures. IMPRESSION: No acute cardiopulmonary disease. Electronically Signed   By: Lajean Manes M.D.   On: 08/25/2017 14:11    Time Spent in minutes  30   Jani Gravel M.D on 08/26/2017 at 9:38 AM  Between 7am to 7pm - Pager -  [phone]  After 7pm go to [email or web address] - password [email or web address]  [facility] -  Office  [phone]

## 2017-08-27 DIAGNOSIS — J9601 Acute respiratory failure with hypoxia: Secondary | ICD-10-CM

## 2017-08-27 MED ORDER — DOXYCYCLINE HYCLATE 50 MG PO CAPS: 100.0000 mg | ORAL_CAPSULE | Freq: Two times a day (BID) | ORAL | 0 refills | Status: AC

## 2017-08-27 MED ORDER — PREDNISONE 10 MG PO TABS: ORAL_TABLET | ORAL | 0 refills | Status: DC

## 2017-08-27 NOTE — Care Management Note (Signed)
Case Management Note  Patient Details  Name: Wendy Torres MRN: [id number] Date of Birth: July 08, 1940  Subjective/Objective:  COPD/ Bronchitis                 Action/Plan: Patient lives at home with spouse, PCP is Dr Cathlean Cower with [facility]; has private insurance with [facility] with prescription drug coverage; pharmacy of choice [facility]; [place] choice offered pt/ daughter chose [place]; Dan with [facility] called for arrangements; home oxygen ordered and to be delivered to the room today prior to discharging home; DME - cane and rollater at home.  Expected Discharge Date:  08/27/17               Expected Discharge Plan:  [place]  In-House Referral:   [facility]  Discharge planning Services  CM Consult  Choice offered to:  Patient, Adult Children  DME Arranged:  Oxygen DME Agency:  [place] Arranged:  RN, Disease Management, PT, Nurse's Aide [place] Agency:  [place]  Status of Service:  In process, will continue to follow  Sherrilyn Rist [phone] 08/27/2017, 2:49 PM

## 2017-08-27 NOTE — Progress Notes (Signed)
[facility] delivered home 02 set and educated pt/family, dc instructions given, pt family aware to call for appt and xhest xray in 1 monthj, all questions entertained and answered, dc via wheelchair and np ortable o2 stable

## 2017-08-27 NOTE — Progress Notes (Signed)
SATURATION QUALIFICATIONS: (This note is used to comply with regulatory documentation for home oxygen)  Patient Saturations on Room Air at Rest = 93%  Patient Saturations on Room Air while Ambulating = 87 Patient Saturations on 1 Liters of oxygen while Ambulating = 100%  Please briefly explain why patient needs home oxygen:pt only able to walk about 30 ft, ra sat down to 84%, only up to 86% with rest and pursed lip bresathing, pt notable doe, pt reovered after several min of rest in chair and 02 at 2l/[place]

## 2017-08-27 NOTE — Discharge Summary (Signed)
Physician Discharge Summary  Wendy Torres NLZ:[id number] DOB: 02/03/1941 DOA: 08/25/2017  PCP: Biagio Borg, MD  Admit date: 08/25/2017 Discharge date: 08/27/2017  Time spent: 35 minutes  Recommendations for Outpatient Follow-up:  1. Steroids and doxy on d/c 2. Need sOP therapy and qualifies for home 02 3. Follow up pcp 1 week and need scxr 1 mo   Discharge Diagnoses:  Active Problems:   Hyperlipidemia   Morbid obesity (HCC)   Iron deficiency anemia   Chronic pain syndrome   Essential hypertension   Asthma   COPD (chronic obstructive pulmonary disease) (HCC)   GERD   OVERACTIVE BLADDER   Osteoarthritis   LOW BACK PAIN   Cervical radiculopathy   COPD exacerbation (HCC)   Congestive heart failure (CHF) (HCC)   History of stroke   Acute respiratory failure with hypoxia [facility])   Discharge Condition: improved  Diet recommendation: diabetic hh  Filed Weights   08/25/17 0837 08/25/17 2104 08/27/17 0539  Weight: 95.7 kg (211 lb) 95.8 kg (211 lb 1.6 oz) 95.6 kg (210 lb 11.2 oz)    History of present illness:  Per Admitting MD 77 y.o.femaleCOPD not on home oxygen, hypertension, hyperlipidemia, anxiety, arthritis, peripheral neuropathy, overactive bladder,  admit with generalized weakness, myalgia, increasing shortness of breath, as well as yellow-green sputum with cough.  Patient states that he has been symptomatic over the last 2 days, after sick contact by a family member who was experiencing similar symptoms. Denies rhinorrhea or hemoptysis  . Deniessubjectivefevers, chills, night sweats or mucositis. Denies any chest pain, chest wall pain or palpitations.Denies any long distant travels. Denies any abdominal pain, nausea or vomiting.Deniesnausea without vomiting. Denies dizziness or vertigo. Denies lower extremity swelling. No confusion was reported. Denies any vision changes, double vision or headaches. Denies any tobacco, alcohol or recreational drug use. She is  compliant with her medications.    Hospital Course:  Acute hypoxic respiratory failure likely secondary to acute COPD exacerbationwith possible bronchitis.  Cont DoxycyclineIV--change to PO on d/c Cont Duonebs and Albuterol  Cont Solumedrol 40 mg IV bid--->prednsione daily to complete Cont Protonix due to steroids Cont Mucinex prn to loosen mucus Oxygen desat screen done-qualifies for home O2   Diastolic CHF: No acute decompensation [id number] lbsLast 2D echo in February 2017, EF 60 to 32%, normal systolic.EKG SR no acute findings. Tn neg BNP normal 30.3 Cont home meds  HypertensionBP 198/92  Pulse 100 , improved Continue home anti-hypertensive medications Cont clonidine, combo amlodipine and ace and OP titraton  Hyperlipidemia Continue home statins  GERD,no acute symptoms Continue PPI  History of overactive bladder Continue Ditropan  Anxiety Continue homeXanax     Discharge Exam: Vitals:   08/27/17 0631 08/27/17 1214  BP: 137/63 135/64  Pulse: 66 61  Resp:  18  Temp: 97.8 F (36.6 C) 97.9 F (36.6 C)  SpO2: 95% 98%    General: awake alert pleasant in nad no distress eating and drinking without issue Cardiovascular: s1 s2 no m/r/g, no arrythmia on monitor Respiratory: clear no added sound  Discharge Instructions   Discharge Instructions    Diet - low sodium heart healthy   Complete by:  As directed    Diet - low sodium heart healthy   Complete by:  As directed    Increase activity slowly   Complete by:  As directed    Increase activity slowly   Complete by:  As directed      Allergies as of 08/27/2017   No Known  Allergies     Medication List    STOP taking these medications   irbesartan 300 MG tablet Commonly known as:  AVAPRO   nystatin 100000 UNIT/ML suspension Commonly known as:  MYCOSTATIN     TAKE these medications   acetaminophen 325 MG tablet Commonly known as:  TYLENOL Take 2 tablets (650 mg  total) by mouth every 6 (six) hours as needed for mild pain (or Fever >/= 101).   ALPRAZolam 0.25 MG tablet Commonly known as:  XANAX TAKE 1 TABLET BY MOUTH TWICE A DAY FOR ANXIETY   amLODipine-benazepril 10-40 MG capsule Commonly known as:  LOTREL TAKE 1 CAPSULE BY MOUTH  DAILY   bisacodyl 10 MG suppository Commonly known as:  DULCOLAX Place 1 suppository (10 mg total) rectally daily as needed for moderate constipation.   budesonide-formoterol 160-4.5 MCG/ACT inhaler Commonly known as:  SYMBICORT Inhale 2 puffs into the lungs 2 (two) times daily.   cetirizine 10 MG tablet Commonly known as:  ZYRTEC TAKE 1 TABLET BY MOUTH EVERY DAY   cloNIDine 0.3 MG tablet Commonly known as:  CATAPRES TAKE 1 TABLET BY MOUTH TWO  TIMES DAILY   doxycycline 50 MG capsule Commonly known as:  VIBRAMYCIN Take 2 capsules (100 mg total) by mouth 2 (two) times daily for 4 days.   DSS 100 MG Caps Take 100 mg by mouth 2 (two) times daily.   furosemide 20 MG tablet Commonly known as:  LASIX TAKE 1 TABLET BY MOUTH  EVERY MORNING   gabapentin 300 MG capsule Commonly known as:  NEURONTIN TAKE 1 CAPSULE BY MOUTH 3  TIMES DAILY   hydrALAZINE 25 MG tablet Commonly known as:  APRESOLINE Take 1 tablet (25 mg total) by mouth 3 (three) times daily.   hydrocortisone-pramoxine 2.5-1 % rectal cream Commonly known as:  ANALPRAM HC Apply rectally twice daily.   lovastatin 40 MG tablet Commonly known as:  MEVACOR TAKE 1 TABLET BY MOUTH AT  BEDTIME   mometasone-formoterol 100-5 MCG/ACT Aero Commonly known as:  DULERA Inhale 2 puffs into the lungs 2 (two) times daily.   oxybutynin 5 MG tablet Commonly known as:  DITROPAN TAKE 1 TABLET BY MOUTH 3  TIMES DAILY   pantoprazole 40 MG tablet Commonly known as:  PROTONIX TAKE 1 TABLET BY MOUTH  DAILY   polyethylene glycol packet Commonly known as:  MIRALAX / GLYCOLAX Take 17 g by mouth daily as needed for mild constipation.   predniSONE 10 MG  tablet Commonly known as:  DELTASONE 3 tabs by mouth per day for 3 days,2tabs per day for 3 days,1tab per day for 3 days   PROAIR HFA 108 (90 Base) MCG/ACT inhaler Generic drug:  albuterol TAKE 2 PUFFS BY MOUTH EVERY 6 HOURS AS NEEDED FOR WHEEZE OR SHORTNESS OF BREATH   SYSTANE OP Place 1 drop into both eyes. Three to four times daily.   tiZANidine 4 MG tablet Commonly known as:  ZANAFLEX TAKE 1 TABLET (4 MG TOTAL) BY MOUTH EVERY 6 (SIX) HOURS AS NEEDED FOR MUSCLE SPASMS.   traMADol 50 MG tablet Commonly known as:  ULTRAM Take 1-2 tablets (50-100 mg total) by mouth every 6 (six) hours as needed. for pain   traZODone 50 MG tablet Commonly known as:  DESYREL TAKE 1/2 TO 1 TABLET BY MOUTH AT BEDTIME AS NEEDED FOR SLEEP            Durable Medical Equipment  (From admission, onward)        Start  Ordered   08/27/17 1350  DME Oxygen  Once    Question Answer Comment  Mode or (Route) Nasal cannula   Liters per Minute 2   Frequency Continuous (stationary and portable oxygen unit needed)   Oxygen conserving device No   Oxygen delivery system Gas      08/27/17 1354     No Known Allergies Follow-up Information    Biagio Borg, MD.   Specialties:  Internal Medicine, Radiology Contact information: [place] [address] [phone]            The results of significant diagnostics from this hospitalization (including imaging, microbiology, ancillary and laboratory) are listed below for reference.    Significant Diagnostic Studies: Dg Chest 2 View  Result Date: 08/25/2017 CLINICAL DATA:  Patient reports SOB that has been going on for awhile but has recently gotten worse X a couple of days ago. EXAM: CHEST - 2 VIEW COMPARISON:  09/26/2016 FINDINGS: Cardiac silhouette is mildly enlarged. No mediastinal or hilar masses. No convincing adenopathy. There are prominent lung markings similar to the prior study. No convincing pneumonia. No pulmonary  edema. No pleural effusion or pneumothorax. Skeletal structures are demineralized. There old healed rib fractures. IMPRESSION: No acute cardiopulmonary disease. Electronically Signed   By: Lajean Manes M.D.   On: 08/25/2017 14:11    Microbiology: Recent Results (from the past 240 hour(s))  Blood culture (routine x 2)     Status: None (Preliminary result)   Collection Time: 08/25/17 12:50 PM  Result Value Ref Range Status   Specimen Description BLOOD RIGHT HAND  Final   Special Requests   Final    BOTTLES DRAWN AEROBIC AND ANAEROBIC Blood Culture adequate volume   Culture   Final    NO GROWTH < 24 HOURS Performed at [facility], [address] [place], [place] [address]    Report Status PENDING  Incomplete  Blood culture (routine x 2)     Status: None (Preliminary result)   Collection Time: 08/25/17 12:52 PM  Result Value Ref Range Status   Specimen Description BLOOD RIGHT FOREARM  Final   Special Requests   Final    BOTTLES DRAWN AEROBIC AND ANAEROBIC Blood Culture adequate volume   Culture   Final    NO GROWTH < 24 HOURS Performed at [facility], [place] [address], [place], [place] [address]    Report Status PENDING  Incomplete  Culture, respiratory (NON-Expectorated)     Status: None   Collection Time: 08/25/17  1:51 PM  Result Value Ref Range Status   Specimen Description EXPECTORATED SPUTUM  Final   Special Requests NONE  Final   Gram Stain   Final    MODERATE WBC PRESENT, PREDOMINANTLY PMN RARE SQUAMOUS EPITHELIAL CELLS PRESENT ABUNDANT GRAM NEGATIVE COCCOBACILLI RARE GRAM POSITIVE COCCI IN PAIRS    Culture   Final    ABUNDANT HAEMOPHILUS INFLUENZAE BETA LACTAMASE POSITIVE Performed at [facility], [place] [address], [place], [place] [address]    Report Status 08/26/2017 FINAL  Final     Labs: Basic Metabolic Panel: Recent Labs  Lab 08/25/17 0835 08/26/17 0518  NA 137 138  K 4.0 4.0  CL 101 104  CO2 26 24  GLUCOSE 114* 132*  BUN 7 11   CREATININE 0.98 0.87  CALCIUM 10.0 9.7   Liver Function Tests: No results for input(s): AST, ALT, ALKPHOS, BILITOT, PROT, ALBUMIN in the last 168 hours. No  results for input(s): LIPASE, AMYLASE in the last 168 hours. No results for input(s): AMMONIA in the last 168 hours. CBC: Recent Labs  Lab 08/25/17 0835 08/26/17 0518  WBC 11.7* 10.3  HGB 12.1 10.8*  HCT 38.0 34.6*  MCV 79.3 78.1  PLT 406* 399   Cardiac Enzymes: Recent Labs  Lab 08/25/17 1247  TROPONINI <0.03   BNP: BNP (last 3 results) Recent Labs    08/25/17 1250  BNP 30.3    ProBNP (last 3 results) Recent Labs    10/05/16 1443  PROBNP 18.0    CBG: No results for input(s): GLUCAP in the last 168 hours.     Signed:  Nita Sells MD   [facility] 08/27/2017, 1:54 PM

## 2017-08-27 NOTE — Consult Note (Signed)
   [facility] CM Inpatient Consult   08/27/2017  Wendy Torres 05-09-1940 [id number]   Patient screened for potential [place] Management services. Patient is in the [facility] the [place] Management services under patient's [employer] plan. Patient is to transition home today with Home health and she has been assigned to [facility] COPD.  Patient's primary care provider is Dr. Cathlean Cower, this office is listed to  provide the transition of care follow up. For questions contact:   Natividad Brood, RN BSN [facility] Liaison  [phone] business mobile phone Toll free office [phone]

## 2017-08-28 ENCOUNTER — Telehealth: Payer: Self-pay | Admitting: *Deleted

## 2017-08-28 NOTE — Telephone Encounter (Signed)
Transition Care Management Follow-up Telephone Call  Date discharged? 08/27/17   How have you been since you were released from the hospital? Pt states she is doing ok   Do you understand why you were in the hospital? YES   Do you understand the discharge instructions? YES   Where were you discharged to? Home   Items Reviewed:  Medications reviewed: YES  Allergies reviewed: YES  Dietary changes reviewed: YES, diabetic   Referrals reviewed: No referral needed   Functional Questionnaire:   Activities of Daily Living (ADLs):   She states she are independent in the following: ambulation, bathing and hygiene, feeding, continence, grooming, toileting and dressing States she doesn't require assistance gets around pretty good   Any transportation issues/concerns?: NO   Any patient concerns? NO   Confirmed importance and date/time of follow-up visits scheduled YES, appt 09/03/17  Provider Appointment booked with Dr. Jenny Reichmann  Confirmed with patient if condition begins to worsen call PCP or go to the ER.  Patient was given the office number and encouraged to call back with question or concerns.  : YES

## 2017-08-29 DIAGNOSIS — J441 Chronic obstructive pulmonary disease with (acute) exacerbation: Secondary | ICD-10-CM | POA: Diagnosis not present

## 2017-08-29 DIAGNOSIS — Z471 Aftercare following joint replacement surgery: Secondary | ICD-10-CM | POA: Diagnosis not present

## 2017-08-30 DIAGNOSIS — Z96643 Presence of artificial hip joint, bilateral: Secondary | ICD-10-CM | POA: Diagnosis not present

## 2017-08-30 DIAGNOSIS — J449 Chronic obstructive pulmonary disease, unspecified: Secondary | ICD-10-CM | POA: Diagnosis not present

## 2017-08-30 DIAGNOSIS — Z7952 Long term (current) use of systemic steroids: Secondary | ICD-10-CM | POA: Diagnosis not present

## 2017-08-30 DIAGNOSIS — Z8673 Personal history of transient ischemic attack (TIA), and cerebral infarction without residual deficits: Secondary | ICD-10-CM | POA: Diagnosis not present

## 2017-08-30 DIAGNOSIS — G894 Chronic pain syndrome: Secondary | ICD-10-CM | POA: Diagnosis not present

## 2017-08-30 DIAGNOSIS — M810 Age-related osteoporosis without current pathological fracture: Secondary | ICD-10-CM | POA: Diagnosis not present

## 2017-08-30 DIAGNOSIS — E1142 Type 2 diabetes mellitus with diabetic polyneuropathy: Secondary | ICD-10-CM | POA: Diagnosis not present

## 2017-08-30 DIAGNOSIS — M1990 Unspecified osteoarthritis, unspecified site: Secondary | ICD-10-CM | POA: Diagnosis not present

## 2017-08-30 DIAGNOSIS — D509 Iron deficiency anemia, unspecified: Secondary | ICD-10-CM | POA: Diagnosis not present

## 2017-08-30 DIAGNOSIS — J9601 Acute respiratory failure with hypoxia: Secondary | ICD-10-CM | POA: Diagnosis not present

## 2017-08-30 DIAGNOSIS — I110 Hypertensive heart disease with heart failure: Secondary | ICD-10-CM | POA: Diagnosis not present

## 2017-08-30 DIAGNOSIS — M5412 Radiculopathy, cervical region: Secondary | ICD-10-CM | POA: Diagnosis not present

## 2017-08-30 DIAGNOSIS — E785 Hyperlipidemia, unspecified: Secondary | ICD-10-CM | POA: Diagnosis not present

## 2017-08-30 DIAGNOSIS — I5030 Unspecified diastolic (congestive) heart failure: Secondary | ICD-10-CM | POA: Diagnosis not present

## 2017-08-30 DIAGNOSIS — K219 Gastro-esophageal reflux disease without esophagitis: Secondary | ICD-10-CM | POA: Diagnosis not present

## 2017-08-30 DIAGNOSIS — M545 Low back pain: Secondary | ICD-10-CM | POA: Diagnosis not present

## 2017-08-30 DIAGNOSIS — Z87891 Personal history of nicotine dependence: Secondary | ICD-10-CM | POA: Diagnosis not present

## 2017-08-30 LAB — CULTURE, BLOOD (ROUTINE X 2)
Culture: NO GROWTH
Culture: NO GROWTH
Special Requests: ADEQUATE
Special Requests: ADEQUATE

## 2017-08-31 ENCOUNTER — Telehealth: Payer: Self-pay | Admitting: Internal Medicine

## 2017-08-31 DIAGNOSIS — E1142 Type 2 diabetes mellitus with diabetic polyneuropathy: Secondary | ICD-10-CM | POA: Diagnosis not present

## 2017-08-31 DIAGNOSIS — Z96643 Presence of artificial hip joint, bilateral: Secondary | ICD-10-CM | POA: Diagnosis not present

## 2017-08-31 DIAGNOSIS — G894 Chronic pain syndrome: Secondary | ICD-10-CM | POA: Diagnosis not present

## 2017-08-31 DIAGNOSIS — M810 Age-related osteoporosis without current pathological fracture: Secondary | ICD-10-CM | POA: Diagnosis not present

## 2017-08-31 DIAGNOSIS — J449 Chronic obstructive pulmonary disease, unspecified: Secondary | ICD-10-CM | POA: Diagnosis not present

## 2017-08-31 DIAGNOSIS — M5412 Radiculopathy, cervical region: Secondary | ICD-10-CM | POA: Diagnosis not present

## 2017-08-31 DIAGNOSIS — Z87891 Personal history of nicotine dependence: Secondary | ICD-10-CM | POA: Diagnosis not present

## 2017-08-31 DIAGNOSIS — E785 Hyperlipidemia, unspecified: Secondary | ICD-10-CM | POA: Diagnosis not present

## 2017-08-31 DIAGNOSIS — M545 Low back pain: Secondary | ICD-10-CM | POA: Diagnosis not present

## 2017-08-31 DIAGNOSIS — Z7952 Long term (current) use of systemic steroids: Secondary | ICD-10-CM | POA: Diagnosis not present

## 2017-08-31 DIAGNOSIS — K219 Gastro-esophageal reflux disease without esophagitis: Secondary | ICD-10-CM | POA: Diagnosis not present

## 2017-08-31 DIAGNOSIS — M1990 Unspecified osteoarthritis, unspecified site: Secondary | ICD-10-CM | POA: Diagnosis not present

## 2017-08-31 DIAGNOSIS — D509 Iron deficiency anemia, unspecified: Secondary | ICD-10-CM | POA: Diagnosis not present

## 2017-08-31 DIAGNOSIS — Z8673 Personal history of transient ischemic attack (TIA), and cerebral infarction without residual deficits: Secondary | ICD-10-CM | POA: Diagnosis not present

## 2017-08-31 DIAGNOSIS — I110 Hypertensive heart disease with heart failure: Secondary | ICD-10-CM | POA: Diagnosis not present

## 2017-08-31 DIAGNOSIS — J9601 Acute respiratory failure with hypoxia: Secondary | ICD-10-CM | POA: Diagnosis not present

## 2017-08-31 DIAGNOSIS — I5030 Unspecified diastolic (congestive) heart failure: Secondary | ICD-10-CM | POA: Diagnosis not present

## 2017-08-31 NOTE — Telephone Encounter (Signed)
Copied from [place] [phone]. Topic: General - Other >> Aug 31, 2017  9:43 AM Carolyn Stare wrote:  Tharon Aquas a PT  with [facility] call to report thrush is back in pt mouth.She has the med for this but was told when she was int he hosp not to use it.Tharon Aquas would like to know if it is ok for her to use it   [phone]

## 2017-08-31 NOTE — Telephone Encounter (Signed)
Ok to restart med

## 2017-09-03 ENCOUNTER — Ambulatory Visit (INDEPENDENT_AMBULATORY_CARE_PROVIDER_SITE_OTHER): Payer: Medicare Other | Admitting: Internal Medicine

## 2017-09-03 ENCOUNTER — Encounter: Payer: Self-pay | Admitting: Internal Medicine

## 2017-09-03 VITALS — BP 124/76 | HR 60 | Temp 98.5°F | Ht 64.0 in | Wt 209.0 lb

## 2017-09-03 DIAGNOSIS — J9601 Acute respiratory failure with hypoxia: Secondary | ICD-10-CM | POA: Diagnosis not present

## 2017-09-03 DIAGNOSIS — I10 Essential (primary) hypertension: Secondary | ICD-10-CM

## 2017-09-03 DIAGNOSIS — J441 Chronic obstructive pulmonary disease with (acute) exacerbation: Secondary | ICD-10-CM | POA: Diagnosis not present

## 2017-09-03 NOTE — Patient Instructions (Addendum)
Your oxygen at rest off oxygen was: 91%  Your oxygen with walking off oxygen was: 80%  Please continue all other medications as before, and refills have been done if requested.  Please have the pharmacy call with any other refills you may need.  Please continue your efforts at being more active, low cholesterol diet, and weight control.  Please keep your appointments with your specialists as you may have planned  Please return in 3 weeks, or sooner if needed

## 2017-09-03 NOTE — Progress Notes (Signed)
Subjective:    Patient ID: Wendy Torres, female    DOB: [birth year], 77 y.o.   MRN: [id number]  HPI  Here after recent hospn: Admit date: 08/25/2017, Discharge date: 08/27/2017; 77 y.o.femaleCOPD not on home oxygen, hypertension, hyperlipidemia, anxiety, arthritis, peripheral neuropathy, overactive bladder, who was admitted with generalized weakness, myalgia, increasing shortness of breath, as well as yellow-green sputum with cough and acute hypoxic resp failure, improved bt with persistent exertional hypoxia and d/c home with new home o2 2L and recommendation for f/u cxr at 4 wks.  Pt has complied with finishing doxycycline, prednisone, PPI, mucinex.  Since home has been doing well  -  Pt denies fever, wt loss, night sweats, loss of appetite, or other constitutional symptoms Pt denies chest pain, increased sob or doe, wheezing, orthopnea, PND, increased LE swelling, palpitations, dizziness or syncope.   Past Medical History:  Diagnosis Date  . ABNORMAL CHEST XRAY 11/05/2008  . ACUTE URIS OF UNSPECIFIED SITE 10/22/2007  . ALLERGIC RHINITIS 12/09/2006  . ANEMIA 01/14/2009  . Anxiety   . Arthritis   . ASTHMA 12/09/2006  . Blood transfusion without reported diagnosis   . Cataract    bilateral -removed  . CHF (congestive heart failure) ([place])   . Chronic pain syndrome 02/16/2009  . COPD 12/09/2006  . DEGENERATIVE JOINT DISEASE 12/09/2006  . DIZZINESS 03/30/2010  . DYSPNEA 01/27/2010   with heavy exertion  . FREQUENCY, URINARY 03/30/2010  . GERD 12/09/2006  . Glossitis 01/14/2009  . HELICOBACTER PYLORI GASTRITIS, HX OF 12/09/2006  . HIATAL HERNIA 03/01/2010  . History of hiatal hernia   . HYPERLIPIDEMIA 04/18/2007  . HYPERSOMNIA 08/31/2008  . HYPERTENSION 12/09/2006  . INSOMNIA-SLEEP DISORDER-UNSPEC 11/05/2008  . LOW BACK PAIN 12/09/2006  . Morbid obesity ([place]) 12/09/2006  . Nonspecific (abnormal) findings on radiological and other examination of body structure 11/05/2008  . OSTEOPOROSIS 12/09/2006  .  OVERACTIVE BLADDER 04/14/2008  . PALPITATIONS, RECURRENT 01/27/2010  . Stroke [facility])    pt. stated light one years ago  . SYNCOPE 11/05/2008  . TRANSIENT ISCHEMIC ATTACK, HX OF 12/09/2006   Past Surgical History:  Procedure Laterality Date  . COLONOSCOPY    . LUMBAR DISC SURGERY    . TONSILLECTOMY     as a child  . TOTAL HIP ARTHROPLASTY Right 11/06/2012   Procedure: RIGHT TOTAL HIP ARTHROPLASTY WITH ACETABULAR AUTOGRAFT;  Surgeon: Gearlean Alf, MD;  Location: [facility];  Service: Orthopedics;  Laterality: Right;  . TOTAL HIP ARTHROPLASTY Left 02/18/2014   Procedure: LEFT TOTAL HIP ARTHROPLASTY;  Surgeon: Gearlean Alf, MD;  Location: [facility];  Service: Orthopedics;  Laterality: Left;  . TUBAL LIGATION      reports that she quit smoking about 44 years ago. Her smoking use included cigarettes. She has a 12.50 pack-year smoking history. She has never used smokeless tobacco. She reports that she does not drink alcohol or use drugs. family history includes Heart disease in her mother; Hypertension in her brother; Lung cancer in her father. No Known Allergies Current Outpatient Medications on File Prior to Visit  Medication Sig Dispense Refill  . acetaminophen (TYLENOL) 325 MG tablet Take 2 tablets (650 mg total) by mouth every 6 (six) hours as needed for mild pain (or Fever >/= 101). 40 tablet 0  . ALPRAZolam (XANAX) 0.25 MG tablet TAKE 1 TABLET BY MOUTH TWICE A DAY FOR ANXIETY 60 tablet 5  . amLODipine-benazepril (LOTREL) 10-40 MG capsule TAKE 1 CAPSULE BY MOUTH  DAILY 90 capsule 0  .  bisacodyl (DULCOLAX) 10 MG suppository Place 1 suppository (10 mg total) rectally daily as needed for moderate constipation. 12 suppository 0  . budesonide-formoterol (SYMBICORT) 160-4.5 MCG/ACT inhaler Inhale 2 puffs into the lungs 2 (two) times daily. 3 Inhaler 3  . cetirizine (ZYRTEC) 10 MG tablet TAKE 1 TABLET BY MOUTH EVERY DAY 30 tablet 4  . cloNIDine (CATAPRES) 0.3 MG tablet TAKE 1 TABLET BY MOUTH TWO  TIMES  DAILY 180 tablet 0  . docusate sodium 100 MG CAPS Take 100 mg by mouth 2 (two) times daily. 10 capsule 0  . furosemide (LASIX) 20 MG tablet TAKE 1 TABLET BY MOUTH  EVERY MORNING 90 tablet 0  . gabapentin (NEURONTIN) 300 MG capsule TAKE 1 CAPSULE BY MOUTH 3  TIMES DAILY 270 capsule 0  . hydrALAZINE (APRESOLINE) 25 MG tablet Take 1 tablet (25 mg total) by mouth 3 (three) times daily. 270 tablet 3  . hydrocortisone-pramoxine (ANALPRAM HC) 2.5-1 % rectal cream Apply rectally twice daily. 30 g 1  . lovastatin (MEVACOR) 40 MG tablet TAKE 1 TABLET BY MOUTH AT  BEDTIME 90 tablet 0  . mometasone-formoterol (DULERA) 100-5 MCG/ACT AERO Inhale 2 puffs into the lungs 2 (two) times daily. 13 g 0  . oxybutynin (DITROPAN) 5 MG tablet TAKE 1 TABLET BY MOUTH 3  TIMES DAILY 270 tablet 0  . pantoprazole (PROTONIX) 40 MG tablet TAKE 1 TABLET BY MOUTH  DAILY 90 tablet 0  . Polyethyl Glycol-Propyl Glycol (SYSTANE OP) Place 1 drop into both eyes. Three to four times daily.    . polyethylene glycol (MIRALAX / GLYCOLAX) packet Take 17 g by mouth daily as needed for mild constipation. 14 each 0  . predniSONE (DELTASONE) 10 MG tablet 3 tabs by mouth per day for 3 days,2tabs per day for 3 days,1tab per day for 3 days 18 tablet 0  . PROAIR HFA 108 (90 Base) MCG/ACT inhaler TAKE 2 PUFFS BY MOUTH EVERY 6 HOURS AS NEEDED FOR WHEEZE OR SHORTNESS OF BREATH 8.5 Inhaler 0  . tiZANidine (ZANAFLEX) 4 MG tablet TAKE 1 TABLET (4 MG TOTAL) BY MOUTH EVERY 6 (SIX) HOURS AS NEEDED FOR MUSCLE SPASMS. 50 tablet 1  . traMADol (ULTRAM) 50 MG tablet Take 1-2 tablets (50-100 mg total) by mouth every 6 (six) hours as needed. for pain 120 tablet 5  . traZODone (DESYREL) 50 MG tablet TAKE 1/2 TO 1 TABLET BY MOUTH AT BEDTIME AS NEEDED FOR SLEEP 60 tablet 5  . [DISCONTINUED] lansoprazole (PREVACID) 30 MG capsule Take 1 capsule (30 mg total) by mouth daily. 90 capsule 3   Current Facility-Administered Medications on File Prior to Visit  Medication Dose  Route Frequency Provider Last Rate Last Dose  . tranexamic acid (CYKLOKAPRON) 2,000 mg in sodium chloride 0.9 % 50 mL Topical Application  2,458 mg Topical Once Constable, [employer], PA-C       Review of Systems  Constitutional: Negative for other unusual diaphoresis or sweats HENT: Negative for ear discharge or swelling Eyes: Negative for other worsening visual disturbances Respiratory: Negative for stridor or other swelling  Gastrointestinal: Negative for worsening distension or other blood Genitourinary: Negative for retention or other urinary change Musculoskeletal: Negative for other MSK pain or swelling Skin: Negative for color change or other new lesions Neurological: Negative for worsening tremors and other numbness  Psychiatric/Behavioral: Negative for worsening agitation or other fatigue All other system neg per pt    Objective:   Physical Exam BP 124/76   Pulse 60   Temp 98.5  F (36.9 C) (Oral)   Ht 5\' 4"  (1.626 m)   Wt 209 lb (94.8 kg)   SpO2 96%   BMI 35.87 kg/m  Home o2 2L  Your oxygen at rest off oxygen was: 91%; and with walking off oxygen was: 80% VS noted, not ill appearing Constitutional: Pt appears in NAD HENT: Head: NCAT.  Right Ear: External ear normal.  Left Ear: External ear normal.  Eyes: . Pupils are equal, round, and reactive to light. Conjunctivae and EOM are normal Nose: without d/c or deformity Neck: Neck supple. Gross normal ROM Cardiovascular: Normal rate and regular rhythm.   Pulmonary/Chest: Effort normal and breath sounds decreased without rales or wheezing.  Neurological: Pt is alert. At baseline orientation, motor grossly intact Skin: Skin is warm. No rashes, other new lesions, no LE edema Psychiatric: Pt behavior is normal without agitation  No other exam findings    Assessment & Plan:

## 2017-09-03 NOTE — Telephone Encounter (Signed)
Called Frankie, LVM with instructions from PCP below.

## 2017-09-03 NOTE — Assessment & Plan Note (Signed)
stable overall by history and exam, recent data reviewed with pt, and pt to continue medical treatment as before,  to f/u any worsening symptoms or concerns BP Readings from Last 3 Encounters:  09/03/17 124/76  08/27/17 135/64  07/18/17 136/86

## 2017-09-03 NOTE — Assessment & Plan Note (Signed)
Improved, no further tx needed, cont neb/inhalers,  to f/u any worsening symptoms or concerns

## 2017-09-03 NOTE — Assessment & Plan Note (Signed)
Persistent, to cont home o2, f/u next visit at 3 wks

## 2017-09-04 ENCOUNTER — Other Ambulatory Visit: Payer: Self-pay | Admitting: Internal Medicine

## 2017-09-05 DIAGNOSIS — Z87891 Personal history of nicotine dependence: Secondary | ICD-10-CM | POA: Diagnosis not present

## 2017-09-05 DIAGNOSIS — E1142 Type 2 diabetes mellitus with diabetic polyneuropathy: Secondary | ICD-10-CM | POA: Diagnosis not present

## 2017-09-05 DIAGNOSIS — D509 Iron deficiency anemia, unspecified: Secondary | ICD-10-CM | POA: Diagnosis not present

## 2017-09-05 DIAGNOSIS — Z8673 Personal history of transient ischemic attack (TIA), and cerebral infarction without residual deficits: Secondary | ICD-10-CM | POA: Diagnosis not present

## 2017-09-05 DIAGNOSIS — M1990 Unspecified osteoarthritis, unspecified site: Secondary | ICD-10-CM | POA: Diagnosis not present

## 2017-09-05 DIAGNOSIS — I5030 Unspecified diastolic (congestive) heart failure: Secondary | ICD-10-CM | POA: Diagnosis not present

## 2017-09-05 DIAGNOSIS — Z96643 Presence of artificial hip joint, bilateral: Secondary | ICD-10-CM | POA: Diagnosis not present

## 2017-09-05 DIAGNOSIS — J9601 Acute respiratory failure with hypoxia: Secondary | ICD-10-CM | POA: Diagnosis not present

## 2017-09-05 DIAGNOSIS — J449 Chronic obstructive pulmonary disease, unspecified: Secondary | ICD-10-CM | POA: Diagnosis not present

## 2017-09-05 DIAGNOSIS — M5412 Radiculopathy, cervical region: Secondary | ICD-10-CM | POA: Diagnosis not present

## 2017-09-05 DIAGNOSIS — K219 Gastro-esophageal reflux disease without esophagitis: Secondary | ICD-10-CM | POA: Diagnosis not present

## 2017-09-05 DIAGNOSIS — M810 Age-related osteoporosis without current pathological fracture: Secondary | ICD-10-CM | POA: Diagnosis not present

## 2017-09-05 DIAGNOSIS — M545 Low back pain: Secondary | ICD-10-CM | POA: Diagnosis not present

## 2017-09-05 DIAGNOSIS — G894 Chronic pain syndrome: Secondary | ICD-10-CM | POA: Diagnosis not present

## 2017-09-05 DIAGNOSIS — E785 Hyperlipidemia, unspecified: Secondary | ICD-10-CM | POA: Diagnosis not present

## 2017-09-05 DIAGNOSIS — Z7952 Long term (current) use of systemic steroids: Secondary | ICD-10-CM | POA: Diagnosis not present

## 2017-09-05 DIAGNOSIS — I110 Hypertensive heart disease with heart failure: Secondary | ICD-10-CM | POA: Diagnosis not present

## 2017-09-07 ENCOUNTER — Telehealth: Payer: Self-pay | Admitting: Internal Medicine

## 2017-09-07 DIAGNOSIS — Z7952 Long term (current) use of systemic steroids: Secondary | ICD-10-CM | POA: Diagnosis not present

## 2017-09-07 DIAGNOSIS — I5030 Unspecified diastolic (congestive) heart failure: Secondary | ICD-10-CM | POA: Diagnosis not present

## 2017-09-07 DIAGNOSIS — Z8673 Personal history of transient ischemic attack (TIA), and cerebral infarction without residual deficits: Secondary | ICD-10-CM | POA: Diagnosis not present

## 2017-09-07 DIAGNOSIS — M1990 Unspecified osteoarthritis, unspecified site: Secondary | ICD-10-CM | POA: Diagnosis not present

## 2017-09-07 DIAGNOSIS — D509 Iron deficiency anemia, unspecified: Secondary | ICD-10-CM | POA: Diagnosis not present

## 2017-09-07 DIAGNOSIS — G894 Chronic pain syndrome: Secondary | ICD-10-CM | POA: Diagnosis not present

## 2017-09-07 DIAGNOSIS — Z87891 Personal history of nicotine dependence: Secondary | ICD-10-CM | POA: Diagnosis not present

## 2017-09-07 DIAGNOSIS — I110 Hypertensive heart disease with heart failure: Secondary | ICD-10-CM | POA: Diagnosis not present

## 2017-09-07 DIAGNOSIS — M545 Low back pain: Secondary | ICD-10-CM | POA: Diagnosis not present

## 2017-09-07 DIAGNOSIS — J449 Chronic obstructive pulmonary disease, unspecified: Secondary | ICD-10-CM | POA: Diagnosis not present

## 2017-09-07 DIAGNOSIS — Z96643 Presence of artificial hip joint, bilateral: Secondary | ICD-10-CM | POA: Diagnosis not present

## 2017-09-07 DIAGNOSIS — M810 Age-related osteoporosis without current pathological fracture: Secondary | ICD-10-CM | POA: Diagnosis not present

## 2017-09-07 DIAGNOSIS — K219 Gastro-esophageal reflux disease without esophagitis: Secondary | ICD-10-CM | POA: Diagnosis not present

## 2017-09-07 DIAGNOSIS — J9601 Acute respiratory failure with hypoxia: Secondary | ICD-10-CM | POA: Diagnosis not present

## 2017-09-07 DIAGNOSIS — E1142 Type 2 diabetes mellitus with diabetic polyneuropathy: Secondary | ICD-10-CM | POA: Diagnosis not present

## 2017-09-07 DIAGNOSIS — M5412 Radiculopathy, cervical region: Secondary | ICD-10-CM | POA: Diagnosis not present

## 2017-09-07 DIAGNOSIS — E785 Hyperlipidemia, unspecified: Secondary | ICD-10-CM | POA: Diagnosis not present

## 2017-09-07 NOTE — Telephone Encounter (Signed)
Called pt to clarify her need for meloxicam. Was not on her med list. She stated that another provider had given her this prescription. She was out of it and wanted to get a refill. But does not know why she was taking it. She will discuss this with her provider at her next office appointment. Provider Dr. Delon [place] 09/25/17

## 2017-09-07 NOTE — Telephone Encounter (Signed)
Copied from [place] [phone]. Topic: Quick Communication - Rx Refill/Question >> Sep 07, 2017 11:47 AM Synthia Innocent wrote: Medication: meloxicam (MOBIC) 15 MG tablet  Has the patient contacted their pharmacy? Yes.  Another dr prescribed (Agent: If no, request that the patient contact the pharmacy for the refill.) Preferred Pharmacy (with phone number [facility] street name): [facility] [address] Agent: Please be advised that RX refills may take up to 3 business days. We ask that you follow-up with your pharmacy.

## 2017-09-11 DIAGNOSIS — Z7952 Long term (current) use of systemic steroids: Secondary | ICD-10-CM | POA: Diagnosis not present

## 2017-09-11 DIAGNOSIS — Z96643 Presence of artificial hip joint, bilateral: Secondary | ICD-10-CM | POA: Diagnosis not present

## 2017-09-11 DIAGNOSIS — I5030 Unspecified diastolic (congestive) heart failure: Secondary | ICD-10-CM | POA: Diagnosis not present

## 2017-09-11 DIAGNOSIS — M1990 Unspecified osteoarthritis, unspecified site: Secondary | ICD-10-CM | POA: Diagnosis not present

## 2017-09-11 DIAGNOSIS — E1142 Type 2 diabetes mellitus with diabetic polyneuropathy: Secondary | ICD-10-CM | POA: Diagnosis not present

## 2017-09-11 DIAGNOSIS — K219 Gastro-esophageal reflux disease without esophagitis: Secondary | ICD-10-CM | POA: Diagnosis not present

## 2017-09-11 DIAGNOSIS — M5412 Radiculopathy, cervical region: Secondary | ICD-10-CM | POA: Diagnosis not present

## 2017-09-11 DIAGNOSIS — D509 Iron deficiency anemia, unspecified: Secondary | ICD-10-CM | POA: Diagnosis not present

## 2017-09-11 DIAGNOSIS — J449 Chronic obstructive pulmonary disease, unspecified: Secondary | ICD-10-CM | POA: Diagnosis not present

## 2017-09-11 DIAGNOSIS — M545 Low back pain: Secondary | ICD-10-CM | POA: Diagnosis not present

## 2017-09-11 DIAGNOSIS — E785 Hyperlipidemia, unspecified: Secondary | ICD-10-CM | POA: Diagnosis not present

## 2017-09-11 DIAGNOSIS — G894 Chronic pain syndrome: Secondary | ICD-10-CM | POA: Diagnosis not present

## 2017-09-11 DIAGNOSIS — J9601 Acute respiratory failure with hypoxia: Secondary | ICD-10-CM | POA: Diagnosis not present

## 2017-09-11 DIAGNOSIS — M810 Age-related osteoporosis without current pathological fracture: Secondary | ICD-10-CM | POA: Diagnosis not present

## 2017-09-11 DIAGNOSIS — Z87891 Personal history of nicotine dependence: Secondary | ICD-10-CM | POA: Diagnosis not present

## 2017-09-11 DIAGNOSIS — I110 Hypertensive heart disease with heart failure: Secondary | ICD-10-CM | POA: Diagnosis not present

## 2017-09-11 DIAGNOSIS — Z8673 Personal history of transient ischemic attack (TIA), and cerebral infarction without residual deficits: Secondary | ICD-10-CM | POA: Diagnosis not present

## 2017-09-12 ENCOUNTER — Other Ambulatory Visit: Payer: Self-pay | Admitting: Internal Medicine

## 2017-09-12 NOTE — Telephone Encounter (Signed)
Patient needs enough sent in to a local pharmacy until mail order arrives, she is out of the medication.  Gabapentin refill Last OV:07/18/17 Last refill:06/04/17 270 cap/0 refill [id number] [facility]acy[id number] [place], [place] [phone] (Phone) [phone] (Fax)

## 2017-09-12 NOTE — Telephone Encounter (Signed)
Copied from [place] [id number]. Topic: Quick Communication - Rx Refill/Question >> Sep 12, 2017  3:58 PM Margot Ables wrote: Medication: gabapentin - pt needing emergency doses of gabapentin sent to pharmacy locally - mail order shipment will not arrive until 5/22 or 5/23 and pt is out. Has the patient contacted their pharmacy? Yes - advised to call MD for emergency RX Preferred Pharmacy (with phone number or street name): [id number] [place], [place] [phone] (Phone) [phone] (Fax)

## 2017-09-13 ENCOUNTER — Telehealth: Payer: Self-pay | Admitting: Internal Medicine

## 2017-09-13 DIAGNOSIS — M545 Low back pain: Secondary | ICD-10-CM | POA: Diagnosis not present

## 2017-09-13 DIAGNOSIS — E1142 Type 2 diabetes mellitus with diabetic polyneuropathy: Secondary | ICD-10-CM | POA: Diagnosis not present

## 2017-09-13 DIAGNOSIS — E785 Hyperlipidemia, unspecified: Secondary | ICD-10-CM | POA: Diagnosis not present

## 2017-09-13 DIAGNOSIS — M1990 Unspecified osteoarthritis, unspecified site: Secondary | ICD-10-CM | POA: Diagnosis not present

## 2017-09-13 DIAGNOSIS — Z87891 Personal history of nicotine dependence: Secondary | ICD-10-CM | POA: Diagnosis not present

## 2017-09-13 DIAGNOSIS — G894 Chronic pain syndrome: Secondary | ICD-10-CM | POA: Diagnosis not present

## 2017-09-13 DIAGNOSIS — Z7952 Long term (current) use of systemic steroids: Secondary | ICD-10-CM | POA: Diagnosis not present

## 2017-09-13 DIAGNOSIS — D509 Iron deficiency anemia, unspecified: Secondary | ICD-10-CM | POA: Diagnosis not present

## 2017-09-13 DIAGNOSIS — K219 Gastro-esophageal reflux disease without esophagitis: Secondary | ICD-10-CM | POA: Diagnosis not present

## 2017-09-13 DIAGNOSIS — J9601 Acute respiratory failure with hypoxia: Secondary | ICD-10-CM | POA: Diagnosis not present

## 2017-09-13 DIAGNOSIS — Z96643 Presence of artificial hip joint, bilateral: Secondary | ICD-10-CM | POA: Diagnosis not present

## 2017-09-13 DIAGNOSIS — M810 Age-related osteoporosis without current pathological fracture: Secondary | ICD-10-CM | POA: Diagnosis not present

## 2017-09-13 DIAGNOSIS — Z8673 Personal history of transient ischemic attack (TIA), and cerebral infarction without residual deficits: Secondary | ICD-10-CM | POA: Diagnosis not present

## 2017-09-13 DIAGNOSIS — J449 Chronic obstructive pulmonary disease, unspecified: Secondary | ICD-10-CM | POA: Diagnosis not present

## 2017-09-13 DIAGNOSIS — I110 Hypertensive heart disease with heart failure: Secondary | ICD-10-CM | POA: Diagnosis not present

## 2017-09-13 DIAGNOSIS — M5412 Radiculopathy, cervical region: Secondary | ICD-10-CM | POA: Diagnosis not present

## 2017-09-13 DIAGNOSIS — I5030 Unspecified diastolic (congestive) heart failure: Secondary | ICD-10-CM | POA: Diagnosis not present

## 2017-09-13 MED ORDER — GABAPENTIN 300 MG PO CAPS: 300.0000 mg | ORAL_CAPSULE | Freq: Three times a day (TID) | ORAL | 1 refills | Status: DC

## 2017-09-13 MED ORDER — GABAPENTIN 300 MG PO CAPS: 300.0000 mg | ORAL_CAPSULE | Freq: Three times a day (TID) | ORAL | 0 refills | Status: DC

## 2017-09-13 NOTE — Telephone Encounter (Signed)
Reviewed chart pt is up-to-date sent refills to local & mail service.Marland KitchenJohny Torres

## 2017-09-13 NOTE — Telephone Encounter (Signed)
Not sure how pt got this impression, as I dont remember saying this, and I find no documentation to that effect as well  I would continue same tx, and she may likely need Home o2 continuous for an extended time  I think we did ask her to f/u at 4 wks from last visit may 6

## 2017-09-13 NOTE — Telephone Encounter (Signed)
Stanton Kidney - PT - with [place] reports pt. Ambulated 2 days ago 300 feel with O2 on and maintained O2 sat >90. Today ambulated 150 feet  with O2 on and O2 sat dipped to 86%. After resting, pt. Was fine. Pt. States "Dr.Johns told me if I was getting short of breath I could take an extra Lasix." Need order placed if she can take an extra pill prn.

## 2017-09-14 NOTE — Telephone Encounter (Signed)
No Number was left to call PT back w/ MD instructions.Marland KitchenJohny Torres

## 2017-09-14 NOTE — Telephone Encounter (Signed)
Called and spoke with pt. Reports her breathing is fine today. States she is not taking any extra Lasix, just what is prescribed. Does report she is using her O2 continuously. Has no complaints. Will keep her appointment for May 28.

## 2017-09-17 ENCOUNTER — Ambulatory Visit: Payer: Self-pay

## 2017-09-17 NOTE — Telephone Encounter (Signed)
Pt wanted to know if she can apply Voltaren cream for her knee and hip pain- she is on Oxygen - OK per NT  Reason for Disposition . General information question, no triage required and triager able to answer question  Answer Assessment - Initial Assessment Questions 1. REASON FOR CALL or QUESTION: "What is your reason for calling today?" or "How can I best help you?" or "What question do you have that I can help answer?"     Pt wanted to know if she can apply Voltaren cream for her knee and hip pain- Pt is using her daughters Voltaren  Protocols used: INFORMATION ONLY CALL-A-AH

## 2017-09-19 ENCOUNTER — Telehealth: Payer: Self-pay | Admitting: Internal Medicine

## 2017-09-19 DIAGNOSIS — J9611 Chronic respiratory failure with hypoxia: Secondary | ICD-10-CM

## 2017-09-19 DIAGNOSIS — M545 Low back pain: Secondary | ICD-10-CM | POA: Diagnosis not present

## 2017-09-19 DIAGNOSIS — J9601 Acute respiratory failure with hypoxia: Secondary | ICD-10-CM | POA: Diagnosis not present

## 2017-09-19 DIAGNOSIS — Z96643 Presence of artificial hip joint, bilateral: Secondary | ICD-10-CM | POA: Diagnosis not present

## 2017-09-19 DIAGNOSIS — G894 Chronic pain syndrome: Secondary | ICD-10-CM | POA: Diagnosis not present

## 2017-09-19 DIAGNOSIS — E785 Hyperlipidemia, unspecified: Secondary | ICD-10-CM | POA: Diagnosis not present

## 2017-09-19 DIAGNOSIS — Z7952 Long term (current) use of systemic steroids: Secondary | ICD-10-CM | POA: Diagnosis not present

## 2017-09-19 DIAGNOSIS — D509 Iron deficiency anemia, unspecified: Secondary | ICD-10-CM | POA: Diagnosis not present

## 2017-09-19 DIAGNOSIS — Z87891 Personal history of nicotine dependence: Secondary | ICD-10-CM | POA: Diagnosis not present

## 2017-09-19 DIAGNOSIS — E1142 Type 2 diabetes mellitus with diabetic polyneuropathy: Secondary | ICD-10-CM | POA: Diagnosis not present

## 2017-09-19 DIAGNOSIS — M1990 Unspecified osteoarthritis, unspecified site: Secondary | ICD-10-CM | POA: Diagnosis not present

## 2017-09-19 DIAGNOSIS — M5412 Radiculopathy, cervical region: Secondary | ICD-10-CM | POA: Diagnosis not present

## 2017-09-19 DIAGNOSIS — Z8673 Personal history of transient ischemic attack (TIA), and cerebral infarction without residual deficits: Secondary | ICD-10-CM | POA: Diagnosis not present

## 2017-09-19 DIAGNOSIS — J449 Chronic obstructive pulmonary disease, unspecified: Secondary | ICD-10-CM | POA: Diagnosis not present

## 2017-09-19 DIAGNOSIS — K219 Gastro-esophageal reflux disease without esophagitis: Secondary | ICD-10-CM | POA: Diagnosis not present

## 2017-09-19 DIAGNOSIS — I110 Hypertensive heart disease with heart failure: Secondary | ICD-10-CM | POA: Diagnosis not present

## 2017-09-19 DIAGNOSIS — I5030 Unspecified diastolic (congestive) heart failure: Secondary | ICD-10-CM | POA: Diagnosis not present

## 2017-09-19 DIAGNOSIS — M810 Age-related osteoporosis without current pathological fracture: Secondary | ICD-10-CM | POA: Diagnosis not present

## 2017-09-19 NOTE — Telephone Encounter (Signed)
Copied from [place] [phone]. Topic: Quick Communication - Rx Refill/Question >> Sep 19, 2017  5:10 PM Marin Olp L wrote: Medication:portable oxygen  Has the patient contacted their pharmacy? Yes.   (Agent: If no, request that the patient contact the pharmacy for the refill.) (Agent: If yes, when and what did the pharmacy advise?)  Preferred Pharmacy (with phone number or street name): [facility] (they told her daughter Hassan Rowan she needs an order for portable oxygen)  Agent: Please be advised that RX refills may take up to 3 business days. We ask that you follow-up with your pharmacy.

## 2017-09-19 NOTE — Telephone Encounter (Deleted)
Copied from [place] [phone]. Topic: Quick Communication - Rx Refill/Question >> Sep 19, 2017  5:10 PM Marin Olp L wrote: Medication: ***  Has the patient contacted their pharmacy? {yes [id number]} (Agent: If no, request that the patient contact the pharmacy for the refill.) (Agent: If yes, when and what did the pharmacy advise?)  Preferred Pharmacy (with phone number or street name): ***  Agent: Please be advised that RX refills may take up to 3 business days. We ask that you follow-up with your pharmacy.

## 2017-09-20 ENCOUNTER — Telehealth: Payer: Self-pay | Admitting: Internal Medicine

## 2017-09-20 NOTE — Telephone Encounter (Signed)
Oxygen order pended for your approval

## 2017-09-20 NOTE — Telephone Encounter (Signed)
Copied from [place] [phone]. Topic: General - Other >> Sep 20, 2017 10:20 AM Oneta Rack wrote: [place] Human name: Georgina Snell Relation to pt: RN from [facility]  Call back number: [phone]    Reason for call:  Requesting verbal orders for nursing education 1x 3, please advise >> Sep 20, 2017 10:24 AM Oneta Rack wrote: [place] Human name: Georgina Snell Relation to pt: RN from [facility]  Call back number: [phone]    Reason for call:  Requesting verbal orders for nursing education 1x 3, please advise

## 2017-09-20 NOTE — Telephone Encounter (Signed)
Done hardcopy to shirron 

## 2017-09-20 NOTE — Telephone Encounter (Signed)
Ok for verbals 

## 2017-09-20 NOTE — Telephone Encounter (Signed)
Faxed

## 2017-09-21 NOTE — Telephone Encounter (Signed)
Called Wendy Torres no answer LMOM w/MD response.Marland KitchenJohny Torres

## 2017-09-25 ENCOUNTER — Ambulatory Visit: Payer: Medicare Other | Admitting: Internal Medicine

## 2017-09-27 DIAGNOSIS — J449 Chronic obstructive pulmonary disease, unspecified: Secondary | ICD-10-CM | POA: Diagnosis not present

## 2017-09-27 DIAGNOSIS — E1142 Type 2 diabetes mellitus with diabetic polyneuropathy: Secondary | ICD-10-CM | POA: Diagnosis not present

## 2017-09-27 DIAGNOSIS — M545 Low back pain: Secondary | ICD-10-CM | POA: Diagnosis not present

## 2017-09-27 DIAGNOSIS — M5412 Radiculopathy, cervical region: Secondary | ICD-10-CM | POA: Diagnosis not present

## 2017-09-27 DIAGNOSIS — E785 Hyperlipidemia, unspecified: Secondary | ICD-10-CM | POA: Diagnosis not present

## 2017-09-27 DIAGNOSIS — D509 Iron deficiency anemia, unspecified: Secondary | ICD-10-CM | POA: Diagnosis not present

## 2017-09-27 DIAGNOSIS — M1990 Unspecified osteoarthritis, unspecified site: Secondary | ICD-10-CM | POA: Diagnosis not present

## 2017-09-27 DIAGNOSIS — Z7952 Long term (current) use of systemic steroids: Secondary | ICD-10-CM | POA: Diagnosis not present

## 2017-09-27 DIAGNOSIS — Z8673 Personal history of transient ischemic attack (TIA), and cerebral infarction without residual deficits: Secondary | ICD-10-CM | POA: Diagnosis not present

## 2017-09-27 DIAGNOSIS — I110 Hypertensive heart disease with heart failure: Secondary | ICD-10-CM | POA: Diagnosis not present

## 2017-09-27 DIAGNOSIS — K219 Gastro-esophageal reflux disease without esophagitis: Secondary | ICD-10-CM | POA: Diagnosis not present

## 2017-09-27 DIAGNOSIS — G894 Chronic pain syndrome: Secondary | ICD-10-CM | POA: Diagnosis not present

## 2017-09-27 DIAGNOSIS — M810 Age-related osteoporosis without current pathological fracture: Secondary | ICD-10-CM | POA: Diagnosis not present

## 2017-09-27 DIAGNOSIS — Z96643 Presence of artificial hip joint, bilateral: Secondary | ICD-10-CM | POA: Diagnosis not present

## 2017-09-27 DIAGNOSIS — J9601 Acute respiratory failure with hypoxia: Secondary | ICD-10-CM | POA: Diagnosis not present

## 2017-09-27 DIAGNOSIS — Z87891 Personal history of nicotine dependence: Secondary | ICD-10-CM | POA: Diagnosis not present

## 2017-09-27 DIAGNOSIS — I5030 Unspecified diastolic (congestive) heart failure: Secondary | ICD-10-CM | POA: Diagnosis not present

## 2017-09-29 DIAGNOSIS — Z471 Aftercare following joint replacement surgery: Secondary | ICD-10-CM | POA: Diagnosis not present

## 2017-09-29 DIAGNOSIS — J441 Chronic obstructive pulmonary disease with (acute) exacerbation: Secondary | ICD-10-CM | POA: Diagnosis not present

## 2017-10-02 DIAGNOSIS — D509 Iron deficiency anemia, unspecified: Secondary | ICD-10-CM | POA: Diagnosis not present

## 2017-10-02 DIAGNOSIS — J449 Chronic obstructive pulmonary disease, unspecified: Secondary | ICD-10-CM | POA: Diagnosis not present

## 2017-10-02 DIAGNOSIS — I110 Hypertensive heart disease with heart failure: Secondary | ICD-10-CM | POA: Diagnosis not present

## 2017-10-02 DIAGNOSIS — K219 Gastro-esophageal reflux disease without esophagitis: Secondary | ICD-10-CM | POA: Diagnosis not present

## 2017-10-02 DIAGNOSIS — J9601 Acute respiratory failure with hypoxia: Secondary | ICD-10-CM | POA: Diagnosis not present

## 2017-10-02 DIAGNOSIS — Z8673 Personal history of transient ischemic attack (TIA), and cerebral infarction without residual deficits: Secondary | ICD-10-CM | POA: Diagnosis not present

## 2017-10-02 DIAGNOSIS — M1990 Unspecified osteoarthritis, unspecified site: Secondary | ICD-10-CM | POA: Diagnosis not present

## 2017-10-02 DIAGNOSIS — M545 Low back pain: Secondary | ICD-10-CM | POA: Diagnosis not present

## 2017-10-02 DIAGNOSIS — M5412 Radiculopathy, cervical region: Secondary | ICD-10-CM | POA: Diagnosis not present

## 2017-10-02 DIAGNOSIS — M810 Age-related osteoporosis without current pathological fracture: Secondary | ICD-10-CM | POA: Diagnosis not present

## 2017-10-02 DIAGNOSIS — E1142 Type 2 diabetes mellitus with diabetic polyneuropathy: Secondary | ICD-10-CM | POA: Diagnosis not present

## 2017-10-02 DIAGNOSIS — I5030 Unspecified diastolic (congestive) heart failure: Secondary | ICD-10-CM | POA: Diagnosis not present

## 2017-10-02 DIAGNOSIS — Z7952 Long term (current) use of systemic steroids: Secondary | ICD-10-CM | POA: Diagnosis not present

## 2017-10-02 DIAGNOSIS — Z87891 Personal history of nicotine dependence: Secondary | ICD-10-CM | POA: Diagnosis not present

## 2017-10-02 DIAGNOSIS — G894 Chronic pain syndrome: Secondary | ICD-10-CM | POA: Diagnosis not present

## 2017-10-02 DIAGNOSIS — Z96643 Presence of artificial hip joint, bilateral: Secondary | ICD-10-CM | POA: Diagnosis not present

## 2017-10-02 DIAGNOSIS — E785 Hyperlipidemia, unspecified: Secondary | ICD-10-CM | POA: Diagnosis not present

## 2017-10-03 ENCOUNTER — Ambulatory Visit (INDEPENDENT_AMBULATORY_CARE_PROVIDER_SITE_OTHER): Payer: Medicare Other | Admitting: Internal Medicine

## 2017-10-03 ENCOUNTER — Other Ambulatory Visit (INDEPENDENT_AMBULATORY_CARE_PROVIDER_SITE_OTHER): Payer: Medicare Other

## 2017-10-03 ENCOUNTER — Encounter: Payer: Self-pay | Admitting: Internal Medicine

## 2017-10-03 VITALS — BP 132/86 | HR 120 | Temp 97.9°F | Ht 64.0 in | Wt 211.0 lb

## 2017-10-03 DIAGNOSIS — J438 Other emphysema: Secondary | ICD-10-CM

## 2017-10-03 DIAGNOSIS — I5032 Chronic diastolic (congestive) heart failure: Secondary | ICD-10-CM | POA: Diagnosis not present

## 2017-10-03 DIAGNOSIS — I10 Essential (primary) hypertension: Secondary | ICD-10-CM | POA: Diagnosis not present

## 2017-10-03 DIAGNOSIS — Z9981 Dependence on supplemental oxygen: Secondary | ICD-10-CM | POA: Diagnosis not present

## 2017-10-03 DIAGNOSIS — R739 Hyperglycemia, unspecified: Secondary | ICD-10-CM

## 2017-10-03 DIAGNOSIS — R7303 Prediabetes: Secondary | ICD-10-CM | POA: Insufficient documentation

## 2017-10-03 LAB — HEPATIC FUNCTION PANEL
ALK PHOS: 93 U/L (ref 39–117)
ALT: 8 U/L (ref 0–35)
AST: 14 U/L (ref 0–37)
Albumin: 4.2 g/dL (ref 3.5–5.2)
Bilirubin, Direct: 0.1 mg/dL (ref 0.0–0.3)
TOTAL PROTEIN: 8.6 g/dL — AB (ref 6.0–8.3)
Total Bilirubin: 0.3 mg/dL (ref 0.2–1.2)

## 2017-10-03 LAB — HEMOGLOBIN A1C: Hgb A1c MFr Bld: 6.3 % (ref 4.6–6.5)

## 2017-10-03 LAB — BASIC METABOLIC PANEL
BUN: 8 mg/dL (ref 6–23)
CHLORIDE: 102 meq/L (ref 96–112)
CO2: 29 meq/L (ref 19–32)
CREATININE: 0.86 mg/dL (ref 0.40–1.20)
Calcium: 10.9 mg/dL — ABNORMAL HIGH (ref 8.4–10.5)
GFR: 81.93 mL/min (ref >60.00)
Glucose, Bld: 100 mg/dL — ABNORMAL HIGH (ref 70–99)
POTASSIUM: 3.9 meq/L (ref 3.5–5.1)
Sodium: 139 mEq/L (ref 135–145)

## 2017-10-03 LAB — CBC WITH DIFFERENTIAL/PLATELET
BASOS PCT: 0.8 % (ref 0.0–3.0)
Basophils Absolute: 0.1 10*3/uL (ref 0.0–0.1)
Eosinophils Absolute: 0.1 10*3/uL (ref 0.0–0.7)
Eosinophils Relative: 1.2 % (ref 0.0–5.0)
HEMATOCRIT: 34.7 % — AB (ref 36.0–46.0)
Hemoglobin: 11.4 g/dL — ABNORMAL LOW (ref 12.0–15.0)
LYMPHS ABS: 2.3 10*3/uL (ref 0.7–4.0)
LYMPHS PCT: 24.7 % (ref 12.0–46.0)
MCHC: 32.8 g/dL (ref 30.0–36.0)
MCV: 77.2 fl — AB (ref 78.0–100.0)
MONOS PCT: 8 % (ref 3.0–12.0)
Monocytes Absolute: 0.7 10*3/uL (ref 0.1–1.0)
NEUTROS ABS: 6 10*3/uL (ref 1.4–7.7)
NEUTROS PCT: 65.3 % (ref 43.0–77.0)
PLATELETS: 485 10*3/uL — AB (ref 150.0–400.0)
RBC: 4.5 Mil/uL (ref 3.87–5.11)
RDW: 18.5 % — ABNORMAL HIGH (ref 11.5–15.5)
WBC: 9.2 10*3/uL (ref 4.0–10.5)

## 2017-10-03 MED ORDER — CLOBETASOL PROPIONATE 0.05 % EX CREA: 1.0000 "application " | TOPICAL_CREAM | Freq: Two times a day (BID) | CUTANEOUS | 0 refills | Status: DC

## 2017-10-03 NOTE — Assessment & Plan Note (Signed)
stable overall by history and exam, recent data reviewed with pt, and pt to continue medical treatment as before,  to f/u any worsening symptoms or concerns, for f/u a1c with labs today 

## 2017-10-03 NOTE — Assessment & Plan Note (Addendum)
O/w stable overall by history and exam,  and pt to continue medical treatment as before,  to f/u any worsening symptoms or concerns  Note:  Total time for pt hx, exam, review of record with pt in the room, determination of diagnoses and plan for further eval and tx is > 40 min, with over 50% spent in coordination and counseling of patient including the differential dx, tx, further evaluation and other management of COPD, oxygen dependent, CHF, HTN, hyperglycemia,

## 2017-10-03 NOTE — Assessment & Plan Note (Signed)
Will need to remain on home o2, and will change to 2L at rest but 3L with exertion, also refer pulmonary

## 2017-10-03 NOTE — Progress Notes (Signed)
Subjective:    Patient ID: Wendy Torres, female    DOB: 1940/10/08, 77 y.o.   MRN: [id number]  HPI  Here to f/u COPD with recent home o2 start, and CHF; Pt denies chest pain, increased sob or doe, wheezing, orthopnea, PND, increased LE swelling, palpitations, dizziness or syncope.  It is noted pt had significant abnormal VS on ambulation into the office to initial VS check with HR 135 (regular) later improved to 120 with sitting, then 89 and regular after resting 10 minutes, remains on Home o2 2L continuous, and initial o2 sat 88% with ambulation, then 95% with rest after.  VS changes assoc with sob, doe. but Pt denies chest pain, wheezing, orthopnea, PND, increased LE swelling, palpitations, dizziness or syncope.     Wt Readings from Last 3 Encounters:  10/03/17 211 lb (95.7 kg)  09/03/17 209 lb (94.8 kg)  08/27/17 210 lb 11.2 oz (95.6 kg)  Also has c/o rash to hands getting worse since last seen with itchy scaly areas, no pain, fever, swelling or drainage, similar to prior eczema she has in the past.   Pt denies polydipsia, polyuria Past Medical History:  Diagnosis Date  . ABNORMAL CHEST XRAY 11/05/2008  . ACUTE URIS OF UNSPECIFIED SITE 10/22/2007  . ALLERGIC RHINITIS 12/09/2006  . ANEMIA 01/14/2009  . Anxiety   . Arthritis   . ASTHMA 12/09/2006  . Blood transfusion without reported diagnosis   . Cataract    bilateral -removed  . CHF (congestive heart failure) ([place])   . Chronic pain syndrome 02/16/2009  . COPD 12/09/2006  . DEGENERATIVE JOINT DISEASE 12/09/2006  . DIZZINESS 03/30/2010  . DYSPNEA 01/27/2010   with heavy exertion  . FREQUENCY, URINARY 03/30/2010  . GERD 12/09/2006  . Glossitis 01/14/2009  . HELICOBACTER PYLORI GASTRITIS, HX OF 12/09/2006  . HIATAL HERNIA 03/01/2010  . History of hiatal hernia   . HYPERLIPIDEMIA 04/18/2007  . HYPERSOMNIA 08/31/2008  . HYPERTENSION 12/09/2006  . INSOMNIA-SLEEP DISORDER-UNSPEC 11/05/2008  . LOW BACK PAIN 12/09/2006  . Morbid obesity ([place])  12/09/2006  . Nonspecific (abnormal) findings on radiological and other examination of body structure 11/05/2008  . OSTEOPOROSIS 12/09/2006  . OVERACTIVE BLADDER 04/14/2008  . PALPITATIONS, RECURRENT 01/27/2010  . Stroke [facility])    pt. stated light one years ago  . SYNCOPE 11/05/2008  . TRANSIENT ISCHEMIC ATTACK, HX OF 12/09/2006   Past Surgical History:  Procedure Laterality Date  . COLONOSCOPY    . LUMBAR DISC SURGERY    . TONSILLECTOMY     as a child  . TOTAL HIP ARTHROPLASTY Right 11/06/2012   Procedure: RIGHT TOTAL HIP ARTHROPLASTY WITH ACETABULAR AUTOGRAFT;  Surgeon: Gearlean Alf, MD;  Location: [facility];  Service: Orthopedics;  Laterality: Right;  . TOTAL HIP ARTHROPLASTY Left 02/18/2014   Procedure: LEFT TOTAL HIP ARTHROPLASTY;  Surgeon: Gearlean Alf, MD;  Location: [facility];  Service: Orthopedics;  Laterality: Left;  . TUBAL LIGATION      reports that she quit smoking about 44 years ago. Her smoking use included cigarettes. She has a 12.50 pack-year smoking history. She has never used smokeless tobacco. She reports that she does not drink alcohol or use drugs. family history includes Heart disease in her mother; Hypertension in her brother; Lung cancer in her father. No Known Allergies Current Outpatient Medications on File Prior to Visit  Medication Sig Dispense Refill  . acetaminophen (TYLENOL) 325 MG tablet Take 2 tablets (650 mg total) by mouth every 6 (six) hours  as needed for mild pain (or Fever >/= 101). 40 tablet 0  . ALPRAZolam (XANAX) 0.25 MG tablet TAKE 1 TABLET BY MOUTH TWICE A DAY FOR ANXIETY 60 tablet 5  . amLODipine-benazepril (LOTREL) 10-40 MG capsule TAKE 1 CAPSULE BY MOUTH  DAILY 90 capsule 0  . bisacodyl (DULCOLAX) 10 MG suppository Place 1 suppository (10 mg total) rectally daily as needed for moderate constipation. 12 suppository 0  . cetirizine (ZYRTEC) 10 MG tablet TAKE 1 TABLET BY MOUTH EVERY DAY 30 tablet 4  . cloNIDine (CATAPRES) 0.3 MG tablet TAKE 1 TABLET BY  MOUTH TWO  TIMES DAILY 180 tablet 0  . docusate sodium 100 MG CAPS Take 100 mg by mouth 2 (two) times daily. 10 capsule 0  . furosemide (LASIX) 20 MG tablet TAKE 1 TABLET BY MOUTH  EVERY MORNING 90 tablet 0  . gabapentin (NEURONTIN) 300 MG capsule Take 1 capsule (300 mg total) by mouth 3 (three) times daily. 42 capsule 0  . hydrALAZINE (APRESOLINE) 25 MG tablet TAKE 1 TABLET BY MOUTH 3  TIMES DAILY 270 tablet 3  . hydrocortisone-pramoxine (ANALPRAM HC) 2.5-1 % rectal cream Apply rectally twice daily. 30 g 1  . lovastatin (MEVACOR) 40 MG tablet TAKE 1 TABLET BY MOUTH AT  BEDTIME 90 tablet 0  . mometasone-formoterol (DULERA) 100-5 MCG/ACT AERO Inhale 2 puffs into the lungs 2 (two) times daily. 13 g 0  . oxybutynin (DITROPAN) 5 MG tablet TAKE 1 TABLET BY MOUTH 3  TIMES DAILY 270 tablet 0  . pantoprazole (PROTONIX) 40 MG tablet TAKE 1 TABLET BY MOUTH  DAILY 90 tablet 0  . Polyethyl Glycol-Propyl Glycol (SYSTANE OP) Place 1 drop into both eyes. Three to four times daily.    . polyethylene glycol (MIRALAX / GLYCOLAX) packet Take 17 g by mouth daily as needed for mild constipation. 14 each 0  . PROAIR HFA 108 (90 Base) MCG/ACT inhaler TAKE 2 PUFFS BY MOUTH EVERY 6 HOURS AS NEEDED FOR WHEEZE OR SHORTNESS OF BREATH 8.5 Inhaler 0  . SYMBICORT 160-4.5 MCG/ACT inhaler TAKE 2 PUFFS BY MOUTH TWICE A DAY 30.6 Inhaler 0  . tiZANidine (ZANAFLEX) 4 MG tablet TAKE 1 TABLET (4 MG TOTAL) BY MOUTH EVERY 6 (SIX) HOURS AS NEEDED FOR MUSCLE SPASMS. 50 tablet 1  . traMADol (ULTRAM) 50 MG tablet Take 1-2 tablets (50-100 mg total) by mouth every 6 (six) hours as needed. for pain 120 tablet 5  . traZODone (DESYREL) 50 MG tablet TAKE 1/2 TO 1 TABLET BY MOUTH AT BEDTIME AS NEEDED FOR SLEEP 60 tablet 5  . [DISCONTINUED] lansoprazole (PREVACID) 30 MG capsule Take 1 capsule (30 mg total) by mouth daily. 90 capsule 3   Current Facility-Administered Medications on File Prior to Visit  Medication Dose Route Frequency Provider Last  Rate Last Dose  . tranexamic acid (CYKLOKAPRON) 2,000 mg in sodium chloride 0.9 % 50 mL Topical Application  5,621 mg Topical Once Constable, [employer], PA-C       Review of Systems  Constitutional: Negative for other unusual diaphoresis or sweats HENT: Negative for ear discharge or swelling Eyes: Negative for other worsening visual disturbances Respiratory: Negative for stridor or other swelling  Gastrointestinal: Negative for worsening distension or other blood Genitourinary: Negative for retention or other urinary change Musculoskeletal: Negative for other MSK pain or swelling Skin: Negative for color change or other new lesions Neurological: Negative for worsening tremors and other numbness  Psychiatric/Behavioral: Negative for worsening agitation or other fatigue All other system neg per  pt    Objective:   Physical Exam BP 132/86   Pulse (!) 120   Temp 97.9 F (36.6 C) (Oral)   Ht 5\' 4"  (1.626 m)   Wt 211 lb (95.7 kg)   SpO2 95%   BMI 36.22 kg/m  VS noted,  Constitutional: Pt appears in NAD HENT: Head: NCAT.  Right Ear: External ear normal.  Left Ear: External ear normal.  Eyes: . Pupils are equal, round, and reactive to light. Conjunctivae and EOM are normal Nose: without d/c or deformity Neck: Neck supple. Gross normal ROM Cardiovascular: Normal rate and regular rhythm.   Pulmonary/Chest: Effort normal and breath sounds decreased without rales or wheezing.  Abd:  Soft, NT, ND, + BS, no organomegaly Neurological: Pt is alert. At baseline orientation, motor grossly intact Skin: Skin is warm. + eczema like rash to hands, other new lesions, no LE edema Psychiatric: Pt behavior is normal without agitation  No other exam findings  Lab Results  Component Value Date   WBC 10.3 08/26/2017   HGB 10.8 (L) 08/26/2017   HCT 34.6 (L) 08/26/2017   PLT 399 08/26/2017   GLUCOSE 132 (H) 08/26/2017   CHOL 181 07/18/2017   TRIG 85.0 07/18/2017   HDL 67.50 07/18/2017   LDLCALC 97  07/18/2017   ALT 11 07/18/2017   AST 19 07/18/2017   NA 138 08/26/2017   K 4.0 08/26/2017   CL 104 08/26/2017   CREATININE 0.87 08/26/2017   BUN 11 08/26/2017   CO2 24 08/26/2017   TSH 0.94 07/18/2017   INR 1.02 02/10/2014        Assessment & Plan:

## 2017-10-03 NOTE — Assessment & Plan Note (Signed)
stable overall by history and exam, recent data reviewed with pt, and pt to continue medical treatment as before,  to f/u any worsening symptoms or concerns BP Readings from Last 3 Encounters:  10/03/17 132/86  09/03/17 124/76  08/27/17 135/64

## 2017-10-03 NOTE — Patient Instructions (Addendum)
Ok to continue the oxygen at 2L at rest, but you will need to increase to 3L with exertion (then back to 2L with resting)  You will be contacted regarding the referral for: pulmonary  Please take all new medication as prescribed - the steroid cream  Please continue all other medications as before, and refills have been done if requested.  Please have the pharmacy call with any other refills you may need.  Please keep your appointments with your specialists as you may have planned  Please go to the LAB in the Basement (turn left off the elevator) for the tests to be done today  You will be contacted by phone if any changes need to be made immediately.  Otherwise, you will receive a letter about your results with an explanation, but please check with MyChart first.  Please remember to sign up for MyChart if you have not done so, as this will be important to you in the future with finding out test results, communicating by private email, and scheduling acute appointments online when needed.  Please return in 4 months, or sooner if needed, with Lab testing done 3-5 days before

## 2017-10-03 NOTE — Assessment & Plan Note (Addendum)
Volume stable, cont same tx, for BMP today 

## 2017-10-07 ENCOUNTER — Other Ambulatory Visit: Payer: Self-pay | Admitting: Internal Medicine

## 2017-10-10 DIAGNOSIS — Z96643 Presence of artificial hip joint, bilateral: Secondary | ICD-10-CM | POA: Diagnosis not present

## 2017-10-10 DIAGNOSIS — Z87891 Personal history of nicotine dependence: Secondary | ICD-10-CM | POA: Diagnosis not present

## 2017-10-10 DIAGNOSIS — E785 Hyperlipidemia, unspecified: Secondary | ICD-10-CM | POA: Diagnosis not present

## 2017-10-10 DIAGNOSIS — M545 Low back pain: Secondary | ICD-10-CM | POA: Diagnosis not present

## 2017-10-10 DIAGNOSIS — I110 Hypertensive heart disease with heart failure: Secondary | ICD-10-CM | POA: Diagnosis not present

## 2017-10-10 DIAGNOSIS — D509 Iron deficiency anemia, unspecified: Secondary | ICD-10-CM | POA: Diagnosis not present

## 2017-10-10 DIAGNOSIS — Z8673 Personal history of transient ischemic attack (TIA), and cerebral infarction without residual deficits: Secondary | ICD-10-CM | POA: Diagnosis not present

## 2017-10-10 DIAGNOSIS — J9601 Acute respiratory failure with hypoxia: Secondary | ICD-10-CM | POA: Diagnosis not present

## 2017-10-10 DIAGNOSIS — M1990 Unspecified osteoarthritis, unspecified site: Secondary | ICD-10-CM | POA: Diagnosis not present

## 2017-10-10 DIAGNOSIS — K219 Gastro-esophageal reflux disease without esophagitis: Secondary | ICD-10-CM | POA: Diagnosis not present

## 2017-10-10 DIAGNOSIS — I5030 Unspecified diastolic (congestive) heart failure: Secondary | ICD-10-CM | POA: Diagnosis not present

## 2017-10-10 DIAGNOSIS — J449 Chronic obstructive pulmonary disease, unspecified: Secondary | ICD-10-CM | POA: Diagnosis not present

## 2017-10-10 DIAGNOSIS — M810 Age-related osteoporosis without current pathological fracture: Secondary | ICD-10-CM | POA: Diagnosis not present

## 2017-10-10 DIAGNOSIS — Z7952 Long term (current) use of systemic steroids: Secondary | ICD-10-CM | POA: Diagnosis not present

## 2017-10-10 DIAGNOSIS — E1142 Type 2 diabetes mellitus with diabetic polyneuropathy: Secondary | ICD-10-CM | POA: Diagnosis not present

## 2017-10-10 DIAGNOSIS — M5412 Radiculopathy, cervical region: Secondary | ICD-10-CM | POA: Diagnosis not present

## 2017-10-10 DIAGNOSIS — G894 Chronic pain syndrome: Secondary | ICD-10-CM | POA: Diagnosis not present

## 2017-10-29 ENCOUNTER — Telehealth: Payer: Self-pay | Admitting: Internal Medicine

## 2017-10-29 DIAGNOSIS — J441 Chronic obstructive pulmonary disease with (acute) exacerbation: Secondary | ICD-10-CM | POA: Diagnosis not present

## 2017-10-29 DIAGNOSIS — Z471 Aftercare following joint replacement surgery: Secondary | ICD-10-CM | POA: Diagnosis not present

## 2017-10-29 NOTE — Telephone Encounter (Signed)
Copied from [place] [phone]. Topic: Quick Communication - Rx Refill/Question >> Oct 29, 2017  2:53 PM Synthia Innocent wrote: Medication: oxybutynin (DITROPAN) 5 MG tablet, pantoprazole (PROTONIX) 40 MG tablet, amLODipine-benazepril (LOTREL) 10-40 MG capsule, furosemide (LASIX) 20 MG tablet   Has the patient contacted their pharmacy? Yes.   (Agent: If no, request that the patient contact the pharmacy for the refill.) (Agent: If yes, when and what did the pharmacy advise?)  Preferred Pharmacy (with phone number or street name): [facility]  Agent: Please be advised that RX refills may take up to 3 business days. We ask that you follow-up with your pharmacy.  Ref #[id number]

## 2017-10-30 MED ORDER — AMLODIPINE BESY-BENAZEPRIL HCL 10-40 MG PO CAPS: 1.0000 | ORAL_CAPSULE | Freq: Every day | ORAL | 0 refills | Status: DC

## 2017-10-30 MED ORDER — PANTOPRAZOLE SODIUM 40 MG PO TBEC: 40.0000 mg | DELAYED_RELEASE_TABLET | Freq: Every day | ORAL | 0 refills | Status: DC

## 2017-10-30 MED ORDER — FUROSEMIDE 20 MG PO TABS: 20.0000 mg | ORAL_TABLET | Freq: Every morning | ORAL | 0 refills | Status: DC

## 2017-10-30 MED ORDER — OXYBUTYNIN CHLORIDE 5 MG PO TABS: 5.0000 mg | ORAL_TABLET | Freq: Three times a day (TID) | ORAL | 0 refills | Status: DC

## 2017-11-07 ENCOUNTER — Other Ambulatory Visit: Payer: Self-pay | Admitting: Internal Medicine

## 2017-11-10 DIAGNOSIS — M25562 Pain in left knee: Secondary | ICD-10-CM | POA: Diagnosis not present

## 2017-11-10 DIAGNOSIS — M25561 Pain in right knee: Secondary | ICD-10-CM | POA: Diagnosis not present

## 2017-11-10 DIAGNOSIS — M170 Bilateral primary osteoarthritis of knee: Secondary | ICD-10-CM | POA: Diagnosis not present

## 2017-11-14 ENCOUNTER — Other Ambulatory Visit: Payer: Self-pay | Admitting: Internal Medicine

## 2017-11-21 ENCOUNTER — Other Ambulatory Visit: Payer: Self-pay | Admitting: Internal Medicine

## 2017-11-21 NOTE — Telephone Encounter (Signed)
Done erx 

## 2017-11-21 NOTE — Telephone Encounter (Signed)
10/22/2017 60# 

## 2017-11-23 ENCOUNTER — Other Ambulatory Visit: Payer: Self-pay | Admitting: Internal Medicine

## 2017-11-29 DIAGNOSIS — Z471 Aftercare following joint replacement surgery: Secondary | ICD-10-CM | POA: Diagnosis not present

## 2017-11-29 DIAGNOSIS — J441 Chronic obstructive pulmonary disease with (acute) exacerbation: Secondary | ICD-10-CM | POA: Diagnosis not present

## 2017-12-05 ENCOUNTER — Other Ambulatory Visit (INDEPENDENT_AMBULATORY_CARE_PROVIDER_SITE_OTHER): Payer: Medicare Other

## 2017-12-05 ENCOUNTER — Ambulatory Visit: Payer: Medicare Other | Admitting: Pulmonary Disease

## 2017-12-05 ENCOUNTER — Encounter: Payer: Self-pay | Admitting: Pulmonary Disease

## 2017-12-05 VITALS — BP 138/70 | HR 50 | Ht 64.0 in | Wt 207.0 lb

## 2017-12-05 DIAGNOSIS — J438 Other emphysema: Secondary | ICD-10-CM | POA: Diagnosis not present

## 2017-12-05 LAB — CBC WITH DIFFERENTIAL/PLATELET
BASOS PCT: 0.9 % (ref 0.0–3.0)
Basophils Absolute: 0.1 10*3/uL (ref 0.0–0.1)
Eosinophils Absolute: 0.2 10*3/uL (ref 0.0–0.7)
Eosinophils Relative: 2.5 % (ref 0.0–5.0)
HEMATOCRIT: 34.6 % — AB (ref 36.0–46.0)
HEMOGLOBIN: 11.2 g/dL — AB (ref 12.0–15.0)
LYMPHS PCT: 26.4 % (ref 12.0–46.0)
Lymphs Abs: 1.9 10*3/uL (ref 0.7–4.0)
MCHC: 32.3 g/dL (ref 30.0–36.0)
MCV: 79.3 fl (ref 78.0–100.0)
MONOS PCT: 9.3 % (ref 3.0–12.0)
Monocytes Absolute: 0.7 10*3/uL (ref 0.1–1.0)
NEUTROS ABS: 4.5 10*3/uL (ref 1.4–7.7)
Neutrophils Relative %: 60.9 % (ref 43.0–77.0)
PLATELETS: 308 10*3/uL (ref 150.0–400.0)
RBC: 4.37 Mil/uL (ref 3.87–5.11)
RDW: 16.6 % — AB (ref 11.5–15.5)
WBC: 7.4 10*3/uL (ref 4.0–10.5)

## 2017-12-05 NOTE — Patient Instructions (Signed)
We will check CBC differential, IgE, alpha-1 antitrypsin levels and phenotype Schedule you for pulmonary function test We will check your oxygen levels on exertion today before you leave  Continue Symbicort for now. Return back to clinic in 1 to 2 months for review of test results and plan for further steps

## 2017-12-05 NOTE — Progress Notes (Signed)
Wendy Torres    [id number]    02/22/1941  Primary Care Physician:John, Hunt Oris, MD  Referring Physician: Biagio Borg, MD [address], [place] [address]  Chief complaint: Consult for emphysema  HPI: 77 year old with history of emphysema, hypertension, diastolic heart failure.  Referred here for evaluation of emphysema Complaints of dyspnea with activity, no symptoms at rest.  Denies any cough, sputum production, wheezing She was hospitalized in April 2018 for COPD exacerbation, CHF exacerbation and has been on supplemental oxygen since then Maintained on Symbicort which she uses twice daily.  States that this helps with the breathing.  She has an albuterol rescue inhaler  Pets: No pets  Occupation: Homemaker Exposures: No known exposures.  Reports a mildew problem in the basement but she hardly goes there Smoking history: 25-pack-year smoker.  Quit in 1974 Travel history: No significant travel Relevant family history: Father had emphysema  Outpatient Encounter Medications as of 12/05/2017  Medication Sig  . acetaminophen (TYLENOL) 325 MG tablet Take 2 tablets (650 mg total) by mouth every 6 (six) hours as needed for mild pain (or Fever >/= 101).  Marland Kitchen ALPRAZolam (XANAX) 0.25 MG tablet TAKE 1 TABLET BY MOUTH TWICE A DAY FOR ANXIETY  . amLODipine-benazepril (LOTREL) 10-40 MG capsule Take 1 capsule by mouth daily.  . bisacodyl (DULCOLAX) 10 MG suppository Place 1 suppository (10 mg total) rectally daily as needed for moderate constipation.  . cetirizine (ZYRTEC) 10 MG tablet TAKE 1 TABLET BY MOUTH EVERY DAY  . clobetasol cream (TEMOVATE) 3.26 % Apply 1 application topically 2 (two) times daily.  . cloNIDine (CATAPRES) 0.3 MG tablet TAKE 1 TABLET BY MOUTH TWO  TIMES DAILY  . docusate sodium 100 MG CAPS Take 100 mg by mouth 2 (two) times daily.  . furosemide (LASIX) 20 MG tablet Take 1 tablet (20 mg total) by mouth every morning.  . gabapentin (NEURONTIN) 300 MG  capsule Take 1 capsule (300 mg total) by mouth 3 (three) times daily.  . hydrALAZINE (APRESOLINE) 25 MG tablet TAKE 1 TABLET BY MOUTH 3  TIMES DAILY  . hydrocortisone-pramoxine (ANALPRAM HC) 2.5-1 % rectal cream Apply rectally twice daily.  Marland Kitchen lovastatin (MEVACOR) 40 MG tablet TAKE 1 TABLET BY MOUTH AT  BEDTIME  . nystatin (MYCOSTATIN) 100000 UNIT/ML suspension TAKE 1 TEASPOONFUL IN THE MOUTH OR THROAT 4 TIMES A DAY AS NEEDED  . oxybutynin (DITROPAN) 5 MG tablet Take 1 tablet (5 mg total) by mouth 3 (three) times daily.  . pantoprazole (PROTONIX) 40 MG tablet Take 1 tablet (40 mg total) by mouth daily.  Vladimir Faster Glycol-Propyl Glycol (SYSTANE OP) Place 1 drop into both eyes. Three to four times daily.  . polyethylene glycol (MIRALAX / GLYCOLAX) packet Take 17 g by mouth daily as needed for mild constipation.  Marland Kitchen PROAIR HFA 108 (90 Base) MCG/ACT inhaler TAKE 2 PUFFS BY MOUTH EVERY 6 HOURS AS NEEDED FOR WHEEZE OR SHORTNESS OF BREATH  . SYMBICORT 160-4.5 MCG/ACT inhaler TAKE 2 PUFFS BY MOUTH TWICE A DAY  . SYMBICORT 160-4.5 MCG/ACT inhaler TAKE 2 PUFFS BY MOUTH TWICE A DAY  . tiZANidine (ZANAFLEX) 4 MG tablet TAKE 1 TABLET (4 MG TOTAL) BY MOUTH EVERY 6 (SIX) HOURS AS NEEDED FOR MUSCLE SPASMS.  Marland Kitchen traMADol (ULTRAM) 50 MG tablet Take 1-2 tablets (50-100 mg total) by mouth every 6 (six) hours as needed. for pain  . traZODone (DESYREL) 50 MG tablet TAKE 1/2 TO 1  TABLET BY MOUTH AT BEDTIME AS NEEDED FOR SLEEP  . [DISCONTINUED] mometasone-formoterol (DULERA) 100-5 MCG/ACT AERO Inhale 2 puffs into the lungs 2 (two) times daily.  . [DISCONTINUED] lansoprazole (PREVACID) 30 MG capsule Take 1 capsule (30 mg total) by mouth daily.   Facility-Administered Encounter Medications as of 12/05/2017  Medication  . tranexamic acid (CYKLOKAPRON) 2,000 mg in sodium chloride 0.9 % 50 mL Topical Application    Allergies as of 12/05/2017  . (No Known Allergies)    Past Medical History:  Diagnosis Date  . ABNORMAL  CHEST XRAY 11/05/2008  . ACUTE URIS OF UNSPECIFIED SITE 10/22/2007  . ALLERGIC RHINITIS 12/09/2006  . ANEMIA 01/14/2009  . Anxiety   . Arthritis   . ASTHMA 12/09/2006  . Blood transfusion without reported diagnosis   . Cataract    bilateral -removed  . CHF (congestive heart failure) ([place])   . Chronic pain syndrome 02/16/2009  . COPD 12/09/2006  . DEGENERATIVE JOINT DISEASE 12/09/2006  . DIZZINESS 03/30/2010  . DYSPNEA 01/27/2010   with heavy exertion  . FREQUENCY, URINARY 03/30/2010  . GERD 12/09/2006  . Glossitis 01/14/2009  . HELICOBACTER PYLORI GASTRITIS, HX OF 12/09/2006  . HIATAL HERNIA 03/01/2010  . History of hiatal hernia   . HYPERLIPIDEMIA 04/18/2007  . HYPERSOMNIA 08/31/2008  . HYPERTENSION 12/09/2006  . INSOMNIA-SLEEP DISORDER-UNSPEC 11/05/2008  . LOW BACK PAIN 12/09/2006  . Morbid obesity ([place]) 12/09/2006  . Nonspecific (abnormal) findings on radiological and other examination of body structure 11/05/2008  . OSTEOPOROSIS 12/09/2006  . OVERACTIVE BLADDER 04/14/2008  . PALPITATIONS, RECURRENT 01/27/2010  . Stroke [facility])    pt. stated light one years ago  . SYNCOPE 11/05/2008  . TRANSIENT ISCHEMIC ATTACK, HX OF 12/09/2006    Past Surgical History:  Procedure Laterality Date  . COLONOSCOPY    . LUMBAR DISC SURGERY    . TONSILLECTOMY     as a child  . TOTAL HIP ARTHROPLASTY Right 11/06/2012   Procedure: RIGHT TOTAL HIP ARTHROPLASTY WITH ACETABULAR AUTOGRAFT;  Surgeon: Gearlean Alf, MD;  Location[facility];  Service: Orthopedics;  Laterality: Right;  . TOTAL HIP ARTHROPLASTY Left 02/18/2014   Procedure: LEFT TOTAL HIP ARTHROPLASTY;  Surgeon: Gearlean Alf, MD;  Location: [facility];  Service: Orthopedics;  Laterality: Left;  . TUBAL LIGATION      Family History  Problem Relation Age of Onset  . Heart disease Mother        Had pacemaker  . Lung cancer Father   . Hypertension Brother   . Diabetes Neg Hx   . Colon cancer Neg Hx   . Colon polyps Neg Hx   . Kidney disease Neg Hx   .  Gallbladder disease Neg Hx   . Esophageal cancer Neg Hx     Social History   Socioeconomic History  . Marital status: Married    Spouse name: Not on file  . Number of children: 10  . Years of education: Not on file  . Highest education level: Not on file  Occupational History  . Occupation: Retired  Scientific laboratory technician  . Financial resource strain: Not on file  . Food insecurity:    Worry: Not on file    Inability: Not on file  . Transportation needs:    Medical: Not on file    Non-medical: Not on file  Tobacco Use  . Smoking status: Former Smoker    Packs/day: 0.50    Years: 25.00    Pack years: 12.50  Types: Cigarettes    Last attempt to quit: 10/29/1972    Years since quitting: 45.1  . Smokeless tobacco: Never Used  Substance and Sexual Activity  . Alcohol use: No    Alcohol/week: 0.0 oz  . Drug use: No  . Sexual activity: Not on file  Lifestyle  . Physical activity:    Days per week: Not on file    Minutes per session: Not on file  . Stress: Not on file  Relationships  . Social connections:    Talks on phone: Not on file    Gets together: Not on file    Attends religious service: Not on file    Active member of club or organization: Not on file    Attends meetings of clubs or organizations: Not on file    Relationship status: Not on file  . Intimate partner violence:    Fear of current or ex partner: Not on file    Emotionally abused: Not on file    Physically abused: Not on file    Forced sexual activity: Not on file  Other Topics Concern  . Not on file  Social History Narrative  . Not on file   Review of systems: Review of Systems  Constitutional: Negative for fever and chills.  HENT: Negative.   Eyes: Negative for blurred vision.  Respiratory: as per HPI  Cardiovascular: Negative for chest pain and palpitations.  Gastrointestinal: Negative for vomiting, diarrhea, blood per rectum. Genitourinary: Negative for dysuria, urgency, frequency and hematuria.   Musculoskeletal: Negative for myalgias, back pain and joint pain.  Skin: Negative for itching and rash.  Neurological: Negative for dizziness, tremors, focal weakness, seizures and loss of consciousness.  Endo/Heme/Allergies: Negative for environmental allergies.  Psychiatric/Behavioral: Negative for depression, suicidal ideas and hallucinations.  All other systems reviewed and are negative.  Physical Exam: Blood pressure 138/70, pulse (!) 50, height 5\' 4"  (1.626 m), weight 207 lb (93.9 kg), SpO2 97 %. Gen:      No acute distress HEENT:  EOMI, sclera anicteric Neck:     No masses; no thyromegaly Lungs:    Clear to auscultation bilaterally; normal respiratory effort CV:         Regular rate and rhythm; no murmurs Abd:      + bowel sounds; soft, non-tender; no palpable masses, no distension Ext:    No edema; adequate peripheral perfusion Skin:      Warm and dry; no rash Neuro: alert and oriented x 3 Psych: normal mood and affect  Data Reviewed: CT chest 10/17/2008- centrilobular emphysema in the upper lobes, 2 mm nodule in the right lung.  Chest x-ray 08/25/2017- no acute cardiopulmonary abnormality I have reviewed the images personally  Assessment:  Emphysema CT scan from 2010 reviewed which shows emphysematous changes in the upper lobes.  Schedule pulmonary function test, CBC differential, IgE alpha-1 antitrypsin levels and phenotype  She is currently on Symbicort which we will continue for now.  Suspicion for asthma is low.  Based on the work-up we may change her to [facility] inhaler.  Currently on supplemental oxygen.  Her oxygen levels remained stable on exertion Instructed to stop supplemental oxygen during the daytime.  Continue oxygen at night Check 6-minute walk test.  Health maintenance 12/03/2013-Prevnar 09/29/2008-Pneumovax  Plan/Recommendations: - Continue Symbicort - Schedule pulmonary function test, 6 MW - CBC, Alpha-1 antitrypsin, IgE - Continue supplemental  oxygen at night.  Okay to stop oxygen during the daytime.  Marshell Garfinkel MD [facility] 12/05/2017, 10:05 AM  CC: Biagio Borg, MD

## 2017-12-10 LAB — ALPHA-1 ANTITRYPSIN PHENOTYPE: A-1 Antitrypsin, Ser: 159 mg/dL (ref 83–199)

## 2017-12-10 LAB — IGE: IgE (Immunoglobulin E), Serum: 224 kU/L — ABNORMAL HIGH (ref <=114)

## 2017-12-21 ENCOUNTER — Ambulatory Visit (INDEPENDENT_AMBULATORY_CARE_PROVIDER_SITE_OTHER): Payer: Medicare Other | Admitting: Pulmonary Disease

## 2017-12-21 DIAGNOSIS — J438 Other emphysema: Secondary | ICD-10-CM | POA: Diagnosis not present

## 2017-12-21 LAB — PULMONARY FUNCTION TEST
DL/VA % pred: 69 %
DL/VA: 3.25 ml/min/mmHg/L
DLCO COR % PRED: 44 %
DLCO UNC: 9.43 ml/min/mmHg
DLCO cor: 10.2 ml/min/mmHg
DLCO unc % pred: 41 %
FEF 25-75 Post: 0.65 L/sec
FEF 25-75 Pre: 0.46 L/sec
FEF2575-%Change-Post: 39 %
FEF2575-%Pred-Post: 50 %
FEF2575-%Pred-Pre: 35 %
FEV1-%Change-Post: 13 %
FEV1-%PRED-POST: 73 %
FEV1-%Pred-Pre: 64 %
FEV1-POST: 1.1 L
FEV1-PRE: 0.97 L
FEV1FVC-%Change-Post: 4 %
FEV1FVC-%PRED-PRE: 78 %
FEV6-%Change-Post: 7 %
FEV6-%PRED-POST: 93 %
FEV6-%PRED-PRE: 86 %
FEV6-POST: 1.73 L
FEV6-PRE: 1.61 L
FEV6FVC-%CHANGE-POST: 0 %
FEV6FVC-%PRED-POST: 104 %
FEV6FVC-%PRED-PRE: 104 %
FVC-%CHANGE-POST: 8 %
FVC-%PRED-POST: 89 %
FVC-%PRED-PRE: 82 %
FVC-POST: 1.75 L
FVC-PRE: 1.61 L
POST FEV6/FVC RATIO: 99 %
Post FEV1/FVC ratio: 63 %
Pre FEV1/FVC ratio: 60 %
Pre FEV6/FVC Ratio: 100 %
RV % PRED: 131 %
RV: 3.04 L
TLC % pred: 94 %
TLC: 4.64 L

## 2017-12-21 NOTE — Progress Notes (Signed)
PFT completed today 12/21/17.

## 2017-12-30 DIAGNOSIS — J441 Chronic obstructive pulmonary disease with (acute) exacerbation: Secondary | ICD-10-CM | POA: Diagnosis not present

## 2017-12-30 DIAGNOSIS — Z471 Aftercare following joint replacement surgery: Secondary | ICD-10-CM | POA: Diagnosis not present

## 2018-01-17 ENCOUNTER — Other Ambulatory Visit: Payer: Self-pay | Admitting: Internal Medicine

## 2018-01-22 ENCOUNTER — Other Ambulatory Visit: Payer: Self-pay | Admitting: Internal Medicine

## 2018-01-29 DIAGNOSIS — Z471 Aftercare following joint replacement surgery: Secondary | ICD-10-CM | POA: Diagnosis not present

## 2018-01-29 DIAGNOSIS — J441 Chronic obstructive pulmonary disease with (acute) exacerbation: Secondary | ICD-10-CM | POA: Diagnosis not present

## 2018-02-01 ENCOUNTER — Other Ambulatory Visit: Payer: Self-pay | Admitting: Internal Medicine

## 2018-02-01 NOTE — Telephone Encounter (Signed)
   LOV:10/03/17 NextOV: 02/06/18 Last Filled/Quantity:01/03/18 120#

## 2018-02-04 ENCOUNTER — Encounter: Payer: Self-pay | Admitting: Pulmonary Disease

## 2018-02-04 ENCOUNTER — Ambulatory Visit: Payer: Medicare Other | Admitting: Pulmonary Disease

## 2018-02-04 ENCOUNTER — Ambulatory Visit (INDEPENDENT_AMBULATORY_CARE_PROVIDER_SITE_OTHER): Payer: Medicare Other | Admitting: *Deleted

## 2018-02-04 VITALS — BP 140/78 | HR 77 | Ht 64.0 in | Wt 208.0 lb

## 2018-02-04 DIAGNOSIS — J449 Chronic obstructive pulmonary disease, unspecified: Secondary | ICD-10-CM

## 2018-02-04 DIAGNOSIS — Z23 Encounter for immunization: Secondary | ICD-10-CM | POA: Diagnosis not present

## 2018-02-04 DIAGNOSIS — J438 Other emphysema: Secondary | ICD-10-CM

## 2018-02-04 MED ORDER — TIOTROPIUM BROMIDE-OLODATEROL 2.5-2.5 MCG/ACT IN AERS: 2.0000 | INHALATION_SPRAY | Freq: Every day | RESPIRATORY_TRACT | 0 refills | Status: AC

## 2018-02-04 NOTE — Progress Notes (Signed)
Wendy Torres    [id number]    1940/10/31  Primary Care Physician:John, Hunt Oris, MD  Referring Physician: Biagio Borg, MD [place],  [id number]  Chief complaint: Follow-up for COPD  HPI: 77 year old with history of emphysema, hypertension, diastolic heart failure.  Referred here for evaluation of emphysema Complaints of dyspnea with activity, no symptoms at rest.  Denies any cough, sputum production, wheezing She was hospitalized in April 2018 for COPD exacerbation, CHF exacerbation and has been on supplemental oxygen since then Maintained on Symbicort which she uses twice daily.  States that this helps with the breathing.  She has an albuterol rescue inhaler  Pets: No pets  Occupation: Homemaker Exposures: No known exposures.  Reports a mildew problem in the basement but she hardly goes there Smoking history: 25-pack-year smoker.  Quit in 1974 Travel history: No significant travel Relevant family history: Father had emphysema . Interim history: Continues on Symbicort.  States that her breathing is doing fine.  She hardly needs to use her rescue inhaler  Outpatient Encounter Medications as of 02/04/2018  Medication Sig  . acetaminophen (TYLENOL) 325 MG tablet Take 2 tablets (650 mg total) by mouth every 6 (six) hours as needed for mild pain (or Fever >/= 101).  Marland Kitchen ALPRAZolam (XANAX) 0.25 MG tablet TAKE 1 TABLET BY MOUTH TWICE A DAY FOR ANXIETY  . amLODipine-benazepril (LOTREL) 10-40 MG capsule Take 1 capsule by mouth daily.  . bisacodyl (DULCOLAX) 10 MG suppository Place 1 suppository (10 mg total) rectally daily as needed for moderate constipation.  . cetirizine (ZYRTEC) 10 MG tablet TAKE 1 TABLET BY MOUTH EVERY DAY  . clobetasol cream (TEMOVATE) 7.26 % Apply 1 application topically 2 (two) times daily.  . cloNIDine (CATAPRES) 0.3 MG tablet TAKE 1 TABLET BY MOUTH TWO  TIMES DAILY  . docusate sodium 100 MG CAPS Take 100 mg by mouth 2 (two)  times daily.  . furosemide (LASIX) 20 MG tablet Take 1 tablet (20 mg total) by mouth every morning.  . gabapentin (NEURONTIN) 300 MG capsule Take 1 capsule (300 mg total) by mouth 3 (three) times daily.  . hydrALAZINE (APRESOLINE) 25 MG tablet TAKE 1 TABLET BY MOUTH 3  TIMES DAILY  . hydrocortisone-pramoxine (ANALPRAM HC) 2.5-1 % rectal cream Apply rectally twice daily.  Marland Kitchen lovastatin (MEVACOR) 40 MG tablet TAKE 1 TABLET BY MOUTH AT  BEDTIME  . nystatin (MYCOSTATIN) 100000 UNIT/ML suspension TAKE 5MLS IN THE MOUTH OR THROAT 4 TIMES A DAY AS NEEDED  . oxybutynin (DITROPAN) 5 MG tablet Take 1 tablet (5 mg total) by mouth 3 (three) times daily.  . pantoprazole (PROTONIX) 40 MG tablet Take 1 tablet (40 mg total) by mouth daily.  Vladimir Faster Glycol-Propyl Glycol (SYSTANE OP) Place 1 drop into both eyes. Three to four times daily.  . polyethylene glycol (MIRALAX / GLYCOLAX) packet Take 17 g by mouth daily as needed for mild constipation.  Marland Kitchen PROAIR HFA 108 (90 Base) MCG/ACT inhaler TAKE 2 PUFFS BY MOUTH EVERY 6 HOURS AS NEEDED FOR WHEEZE OR SHORTNESS OF BREATH  . SYMBICORT 160-4.5 MCG/ACT inhaler TAKE 2 PUFFS BY MOUTH TWICE A DAY  . tiZANidine (ZANAFLEX) 4 MG tablet TAKE 1 TABLET (4 MG TOTAL) BY MOUTH EVERY 6 (SIX) HOURS AS NEEDED FOR MUSCLE SPASMS.  Marland Kitchen traMADol (ULTRAM) 50 MG tablet TAKE 1-2 TABLETS (50-100 MG TOTAL) BY MOUTH EVERY 6 (SIX) HOURS AS NEEDED. FOR PAIN  . traZODone (  DESYREL) 50 MG tablet TAKE 1/2 TO 1 TABLET BY MOUTH AT BEDTIME AS NEEDED FOR SLEEP  . [DISCONTINUED] SYMBICORT 160-4.5 MCG/ACT inhaler TAKE 2 PUFFS BY MOUTH TWICE A DAY  . [DISCONTINUED] lansoprazole (PREVACID) 30 MG capsule Take 1 capsule (30 mg total) by mouth daily.   Facility-Administered Encounter Medications as of 02/04/2018  Medication  . tranexamic acid (CYKLOKAPRON) 2,000 mg in sodium chloride 0.9 % 50 mL Topical Application   Physical Exam: Blood pressure 140/78, pulse 77, height 5\' 4"  (1.626 m), weight 208 lb (94.3  kg), SpO2 92 %. Gen:      No acute distress HEENT:  EOMI, sclera anicteric Neck:     No masses; no thyromegaly Lungs:    Clear to auscultation bilaterally; normal respiratory effort CV:         Regular rate and rhythm; no murmurs Abd:      + bowel sounds; soft, non-tender; no palpable masses, no distension Ext:    No edema; adequate peripheral perfusion Skin:      Warm and dry; no rash Neuro: alert and oriented x 3 Psych: normal mood and affect  Data Reviewed: Imaging CT chest 10/17/2008- centrilobular emphysema in the upper lobes, 2 mm nodule in the right lung.  Chest x-ray 08/25/2017- no acute cardiopulmonary abnormality I have reviewed the images personally  PFTs  12/21/2017 FVC 1.75 [9%), FEV1 1.10 [73%], F/F 63, TLC 94%, DLCO 41% Moderate obstruction, severe diffusion defect.  6-minute walk test 02/04/2018 96 m, Patient had to stop for desats.  Titrated to 3 L oxygen  Labs CBC 02/04/2018-WBC 7.4, eos 2.5%, absolute eosinophil count 185 Alpha-1 antitrypsin 12/05/2017-159, PI MM IgE 12/05/2017-224  Assessment:  COPD CT scan from 2010 reviewed which shows emphysematous changes in the upper lobes.   PFTs reviewed which show moderate obstruction with no significant bronchodilator response.  She is currently on Symbicort.  We will give her a sample of Stiolto, LABA/LAMA inhaler.  If she likes this better than we can call in a prescription I do not believe she will need inhaled corticosteroid as suspicion for asthma is low, peripheral eosinophils are low.  Continue supplemental oxygen with exercise and at night.  Health maintenance Flu vaccine today. 12/03/2013-Prevnar 09/29/2008-Pneumovax  Plan/Recommendations: - Sample of Stiolto - Continue supplemental oxygen - Flu vaccine.  Marshell Garfinkel MD [facility] 02/04/2018, 10:10 AM  CC: Biagio Borg, MD

## 2018-02-04 NOTE — Progress Notes (Signed)
Inhaler training given. In-check peak flow #:65 

## 2018-02-04 NOTE — Patient Instructions (Signed)
We will give you a sample of Stiolto Use this for 2 weeks instead of the Symbicort.  If it works better for you then we can call in a prescription Continue supplemental oxygen at night and during daytime We will give you a flu vaccination today Follow-up in 6 months.

## 2018-02-04 NOTE — Progress Notes (Signed)
     SIX MIN WALK 02/04/2018 12/05/2017  Medications Gabapentin,ASA,hydralazine,symbicort,catapres -  Supplimental Oxygen during Test? (L/min) Yes No  O2 Flow Rate 2 -  Type Continuous -  Laps 2 -  Partial Lap (in Meters) 0 -  Baseline BP (sitting) 140/72 -  Baseline Heartrate 77 -  Baseline Dyspnea (Borg Scale) 3 -  Baseline Fatigue (Borg Scale) 0.5 -  Baseline SPO2 94 -  BP (sitting) 180/94 -  Heartrate 124 -  Dyspnea (Borg Scale) 1 -  Fatigue (Borg Scale) 2 -  SPO2 90 -  BP (sitting) 164/92 -  Heartrate 85 -  SPO2 97 -  Stopped or Paused before Six Minutes Yes -  Other Symptoms at end of Exercise stopped at the end of first 1/2 lap to place her on 2LNC, sats dropped to 85% on RA -  Distance Completed 96 -  Tech Comments: - steady walk, pt did not desat, only did 1.5 laps due to back pain TA/CMA

## 2018-02-06 ENCOUNTER — Ambulatory Visit: Payer: Medicare Other | Admitting: Internal Medicine

## 2018-02-12 ENCOUNTER — Other Ambulatory Visit: Payer: Self-pay | Admitting: Internal Medicine

## 2018-02-25 ENCOUNTER — Telehealth: Payer: Self-pay | Admitting: Pulmonary Disease

## 2018-02-25 NOTE — Telephone Encounter (Signed)
Atc call but mail box was full I was unable to leave a message at this time.

## 2018-02-26 NOTE — Telephone Encounter (Signed)
Attempted to call pt's daughter Olin Hauser but line went straight to VM and unable to leave a message due to VM being full.  Will try to call back later.

## 2018-02-27 NOTE — Telephone Encounter (Signed)
Attempted to contact pt's daughter, Olin Hauser. I did not receive an answer. There was no option for me to leave a message due to her voicemail being full. Will try back.

## 2018-02-28 ENCOUNTER — Telehealth: Payer: Self-pay | Admitting: Pulmonary Disease

## 2018-02-28 MED ORDER — TIOTROPIUM BROMIDE-OLODATEROL 2.5-2.5 MCG/ACT IN AERS: 2.0000 | INHALATION_SPRAY | Freq: Every day | RESPIRATORY_TRACT | 5 refills | Status: DC

## 2018-02-28 NOTE — Telephone Encounter (Signed)
We have attempted to contact pt's daughter, Olin Hauser several times with no success. Per triage protocol, message will be closed.

## 2018-02-28 NOTE — Telephone Encounter (Signed)
Spoke with patient's daughter. She stated that the patient was given a sample of Stiolto during her last visit and it has been working well. She has requested that [facility] [address]. Advised patient that I would send the RX in. She verbalized understanding. Nothing further needed at time of call.

## 2018-03-01 DIAGNOSIS — J441 Chronic obstructive pulmonary disease with (acute) exacerbation: Secondary | ICD-10-CM | POA: Diagnosis not present

## 2018-03-01 DIAGNOSIS — Z471 Aftercare following joint replacement surgery: Secondary | ICD-10-CM | POA: Diagnosis not present

## 2018-03-11 ENCOUNTER — Other Ambulatory Visit: Payer: Self-pay | Admitting: Internal Medicine

## 2018-03-11 NOTE — Telephone Encounter (Signed)
Copied from [place] [id number]. Topic: Quick Communication - See Telephone Encounter >> Mar 11, 2018 12:08 PM Ivar Drape wrote: CRM for notification. See Telephone encounter for: 03/11/18. Patient would like a refill on her traMADol (ULTRAM) 50 MG tablet medication and have it sent [facility] [address].  Patient is completely out of the medication.

## 2018-03-11 NOTE — Telephone Encounter (Signed)
Requested medication (s) are due for refill today: yes  Requested medication (s) are on the active medication list: yes    Last refill: 02/01/18  #120  0 refills  Future visit scheduled no  Notes to clinic:not delegated  Requested Prescriptions  Pending Prescriptions Disp Refills   traMADol (ULTRAM) 50 MG tablet 120 tablet 0    Sig: Take 1-2 tablets (50-100 mg total) by mouth every 6 (six) hours as needed. for pain     Not Delegated - Analgesics:  Opioid Agonists Failed - 03/11/2018 12:11 PM      Failed - This refill cannot be delegated      Failed - Urine Drug Screen completed in last 360 days.      Passed - Valid encounter within last 6 months    Recent Outpatient Visits          5 months ago Other emphysema [facility])   [place] John, James W, MD   6 months ago Acute respiratory failure with hypoxia [facility])   [place] John, James W, MD   7 months ago Encounter for preventative adult health care exam with abnormal findings   [place] John, James W, MD   1 year ago Acute on chronic diastolic congestive heart failure [facility])   [facility] -Georges Mouse, MD   1 year ago COPD exacerbation [facility])    [facility] -Georges Mouse, MD

## 2018-03-12 MED ORDER — TRAMADOL HCL 50 MG PO TABS: 50.0000 mg | ORAL_TABLET | Freq: Four times a day (QID) | ORAL | 2 refills | Status: DC | PRN

## 2018-03-12 NOTE — Telephone Encounter (Signed)
MD approved and sent electronically to pof../lmb  

## 2018-03-12 NOTE — Telephone Encounter (Signed)
Done erx 

## 2018-03-13 DIAGNOSIS — H35373 Puckering of macula, bilateral: Secondary | ICD-10-CM | POA: Diagnosis not present

## 2018-03-13 DIAGNOSIS — Z961 Presence of intraocular lens: Secondary | ICD-10-CM | POA: Diagnosis not present

## 2018-03-13 DIAGNOSIS — H353131 Nonexudative age-related macular degeneration, bilateral, early dry stage: Secondary | ICD-10-CM | POA: Diagnosis not present

## 2018-03-13 DIAGNOSIS — H43813 Vitreous degeneration, bilateral: Secondary | ICD-10-CM | POA: Diagnosis not present

## 2018-03-13 DIAGNOSIS — H16223 Keratoconjunctivitis sicca, not specified as Sjogren's, bilateral: Secondary | ICD-10-CM | POA: Diagnosis not present

## 2018-03-31 ENCOUNTER — Other Ambulatory Visit: Payer: Self-pay | Admitting: Internal Medicine

## 2018-03-31 DIAGNOSIS — J441 Chronic obstructive pulmonary disease with (acute) exacerbation: Secondary | ICD-10-CM | POA: Diagnosis not present

## 2018-03-31 DIAGNOSIS — Z471 Aftercare following joint replacement surgery: Secondary | ICD-10-CM | POA: Diagnosis not present

## 2018-04-08 ENCOUNTER — Other Ambulatory Visit: Payer: Self-pay | Admitting: Internal Medicine

## 2018-04-19 ENCOUNTER — Other Ambulatory Visit: Payer: Self-pay | Admitting: Internal Medicine

## 2018-04-30 ENCOUNTER — Telehealth: Payer: Self-pay | Admitting: Pulmonary Disease

## 2018-04-30 NOTE — Telephone Encounter (Signed)
Called and spoke with patients daughter, she stated that they will try the OTC medications first and if this does not help they will make an appointment. Nothing further needed.

## 2018-04-30 NOTE — Telephone Encounter (Signed)
Patient needs appointment. She can start mucinex and delsym until being seen.

## 2018-04-30 NOTE — Telephone Encounter (Signed)
Called and spoke with patient, she stated that she is not feeling well and she is having a productive cough with thick clear mucus. She has hoarseness. Denies fever, no chills or body aches. Patient would like something to be called in. Thank you.

## 2018-05-01 DIAGNOSIS — J441 Chronic obstructive pulmonary disease with (acute) exacerbation: Secondary | ICD-10-CM | POA: Diagnosis not present

## 2018-05-01 DIAGNOSIS — Z471 Aftercare following joint replacement surgery: Secondary | ICD-10-CM | POA: Diagnosis not present

## 2018-05-13 ENCOUNTER — Other Ambulatory Visit: Payer: Self-pay | Admitting: Internal Medicine

## 2018-05-30 ENCOUNTER — Other Ambulatory Visit: Payer: Self-pay | Admitting: Internal Medicine

## 2018-06-01 DIAGNOSIS — Z471 Aftercare following joint replacement surgery: Secondary | ICD-10-CM | POA: Diagnosis not present

## 2018-06-01 DIAGNOSIS — J441 Chronic obstructive pulmonary disease with (acute) exacerbation: Secondary | ICD-10-CM | POA: Diagnosis not present

## 2018-06-05 DIAGNOSIS — M1711 Unilateral primary osteoarthritis, right knee: Secondary | ICD-10-CM | POA: Diagnosis not present

## 2018-06-05 DIAGNOSIS — M170 Bilateral primary osteoarthritis of knee: Secondary | ICD-10-CM | POA: Diagnosis not present

## 2018-06-05 DIAGNOSIS — M1712 Unilateral primary osteoarthritis, left knee: Secondary | ICD-10-CM | POA: Diagnosis not present

## 2018-06-15 ENCOUNTER — Other Ambulatory Visit: Payer: Self-pay | Admitting: Internal Medicine

## 2018-06-17 MED ORDER — ALPRAZOLAM 0.25 MG PO TABS: ORAL_TABLET | ORAL | 5 refills | Status: DC

## 2018-06-17 NOTE — Telephone Encounter (Signed)
Done erx 

## 2018-06-30 DIAGNOSIS — Z96642 Presence of left artificial hip joint: Secondary | ICD-10-CM | POA: Diagnosis not present

## 2018-06-30 DIAGNOSIS — Z471 Aftercare following joint replacement surgery: Secondary | ICD-10-CM | POA: Diagnosis not present

## 2018-06-30 DIAGNOSIS — M6281 Muscle weakness (generalized): Secondary | ICD-10-CM | POA: Diagnosis not present

## 2018-06-30 DIAGNOSIS — R269 Unspecified abnormalities of gait and mobility: Secondary | ICD-10-CM | POA: Diagnosis not present

## 2018-07-12 ENCOUNTER — Other Ambulatory Visit: Payer: Self-pay | Admitting: Internal Medicine

## 2018-07-14 ENCOUNTER — Other Ambulatory Visit: Payer: Self-pay | Admitting: Internal Medicine

## 2018-07-15 ENCOUNTER — Other Ambulatory Visit: Payer: Self-pay | Admitting: Internal Medicine

## 2018-07-15 NOTE — Telephone Encounter (Signed)
Done erx 

## 2018-07-30 ENCOUNTER — Other Ambulatory Visit: Payer: Self-pay | Admitting: Internal Medicine

## 2018-07-31 ENCOUNTER — Ambulatory Visit: Payer: Self-pay | Admitting: *Deleted

## 2018-07-31 DIAGNOSIS — Z471 Aftercare following joint replacement surgery: Secondary | ICD-10-CM | POA: Diagnosis not present

## 2018-07-31 DIAGNOSIS — M6281 Muscle weakness (generalized): Secondary | ICD-10-CM | POA: Diagnosis not present

## 2018-07-31 DIAGNOSIS — Z96642 Presence of left artificial hip joint: Secondary | ICD-10-CM | POA: Diagnosis not present

## 2018-07-31 DIAGNOSIS — R269 Unspecified abnormalities of gait and mobility: Secondary | ICD-10-CM | POA: Diagnosis not present

## 2018-07-31 NOTE — Telephone Encounter (Signed)
Message from Esaw Dace sent at 07/31/2018 3:10 PM EDT   Summary: blood pressure 158/90 / no symptoms at this time   Pt's Daughter Hassan Rowan called back to request NT call the pt, her mother at home. CB#[phone] ----- Message from Rutherford Nail, NT sent at 07/31/2018 12:29 PM EDT ----- Patient's daughter, Hassan Rowan, calling and states that the patient's blood pressure has been a little elevated. States that yesterday it was 154/95 and today it was 158/90. States the she felt light headed yesterday, but is not complaining of any symptoms today. Please advise.  CB#: [phone]         Called patient back regarding her b/p. She is rechecking her b/p now. She is having a lot of pain in her knees and back. She takes her medication for it but does not help much. Her b/p 212/139 and no cardiac symptoms. rechecked again and it was 177/101. Denies shortness of breath, sweating, headache, or difficulty walking or any weakness on either side of the body. She feels some lightheadedness when she stands up. Advised he to keep a check on her b/p and to call 911 if she starts having any symptoms mentioned above. Pt voiced understanding. Per protocol, she needs to see her provider within 24 hours. Pt advised that it would be a virtual visit or a telephone call. Pt voiced understanding and can check with her daughter regarding a virtual visit. Routing to flow at [facility] at [facility] for review.     Reason for Disposition . Systolic BP  >= 491 OR Diastolic >= 791  Answer Assessment - Initial Assessment Questions 1. BLOOD PRESSURE: "What is the blood pressure?" "Did you take at least two measurements 5 minutes apart?"     212/139 now and 177/101  2. ONSET: "When did you take your blood pressure?"     Just now 3. HOW: "How did you obtain the blood pressure?" (e.g., visiting nurse, automatic home BP monitor)     Automatic wrist monitor 4. HISTORY: "Do you have a history of high blood pressure?"     yes 5.  MEDICATIONS: "Are you taking any medications for blood pressure?" "Have you missed any doses recently?"     Have not missed any medications., . OTHER SYMPTOMS: "Do you have any symptoms?" (e.g., headache, chest pain, blurred vision, difficulty breathing, weakness)     Headache, is on oxygen 7. PREGNANCY: "Is there any chance you are pregnant?" "When was your last menstrual period?"     n/a  Protocols used: HIGH BLOOD PRESSURE-A-AH

## 2018-07-31 NOTE — Telephone Encounter (Signed)
Call placed to brenda patients daughter. Left VM to return call to office.

## 2018-08-01 NOTE — Telephone Encounter (Signed)
Pt has hx of difficult to control HTN, currently on 4 medications. Please verify pt is taking the lotrel, catapress, and hydralazine.      If only usually mild elevated and no symptoms and had a transient "spike" that resolved, I think we can follow for now.    Please continue to monitor, as if her BP were to be consistently > 160/90 over the next week or so, we would need to consider increased hydralazine.

## 2018-08-01 NOTE — Telephone Encounter (Signed)
Attempted to call pt'sa daughter, Hassan Rowan, back at the number listed below as well as the number listed for her in the demographics section. Both numbers only ring, no VM comes on.

## 2018-08-30 DIAGNOSIS — Z96642 Presence of left artificial hip joint: Secondary | ICD-10-CM | POA: Diagnosis not present

## 2018-08-30 DIAGNOSIS — M6281 Muscle weakness (generalized): Secondary | ICD-10-CM | POA: Diagnosis not present

## 2018-08-30 DIAGNOSIS — R269 Unspecified abnormalities of gait and mobility: Secondary | ICD-10-CM | POA: Diagnosis not present

## 2018-08-30 DIAGNOSIS — Z471 Aftercare following joint replacement surgery: Secondary | ICD-10-CM | POA: Diagnosis not present

## 2018-09-17 ENCOUNTER — Other Ambulatory Visit: Payer: Self-pay | Admitting: Pulmonary Disease

## 2018-09-30 DIAGNOSIS — Z471 Aftercare following joint replacement surgery: Secondary | ICD-10-CM | POA: Diagnosis not present

## 2018-09-30 DIAGNOSIS — M6281 Muscle weakness (generalized): Secondary | ICD-10-CM | POA: Diagnosis not present

## 2018-09-30 DIAGNOSIS — Z96642 Presence of left artificial hip joint: Secondary | ICD-10-CM | POA: Diagnosis not present

## 2018-09-30 DIAGNOSIS — R269 Unspecified abnormalities of gait and mobility: Secondary | ICD-10-CM | POA: Diagnosis not present

## 2018-10-08 ENCOUNTER — Other Ambulatory Visit: Payer: Self-pay

## 2018-10-08 MED ORDER — FUROSEMIDE 20 MG PO TABS: 20.0000 mg | ORAL_TABLET | Freq: Every morning | ORAL | 1 refills | Status: DC

## 2018-10-08 MED ORDER — PANTOPRAZOLE SODIUM 40 MG PO TBEC: 40.0000 mg | DELAYED_RELEASE_TABLET | Freq: Every day | ORAL | 1 refills | Status: DC

## 2018-10-08 MED ORDER — OXYBUTYNIN CHLORIDE 5 MG PO TABS: 5.0000 mg | ORAL_TABLET | Freq: Three times a day (TID) | ORAL | 1 refills | Status: DC

## 2018-10-08 MED ORDER — GABAPENTIN 300 MG PO CAPS: ORAL_CAPSULE | ORAL | 1 refills | Status: DC

## 2018-10-27 ENCOUNTER — Other Ambulatory Visit: Payer: Self-pay | Admitting: Internal Medicine

## 2018-10-28 ENCOUNTER — Other Ambulatory Visit: Payer: Self-pay | Admitting: Internal Medicine

## 2018-10-30 DIAGNOSIS — M6281 Muscle weakness (generalized): Secondary | ICD-10-CM | POA: Diagnosis not present

## 2018-10-30 DIAGNOSIS — Z471 Aftercare following joint replacement surgery: Secondary | ICD-10-CM | POA: Diagnosis not present

## 2018-10-30 DIAGNOSIS — Z96642 Presence of left artificial hip joint: Secondary | ICD-10-CM | POA: Diagnosis not present

## 2018-10-30 DIAGNOSIS — R269 Unspecified abnormalities of gait and mobility: Secondary | ICD-10-CM | POA: Diagnosis not present

## 2018-11-11 ENCOUNTER — Telehealth: Payer: Self-pay | Admitting: Internal Medicine

## 2018-11-11 MED ORDER — AMLODIPINE BESY-BENAZEPRIL HCL 10-40 MG PO CAPS: 1.0000 | ORAL_CAPSULE | Freq: Every day | ORAL | 0 refills | Status: DC

## 2018-11-11 NOTE — Telephone Encounter (Signed)
Medication Refill - Medication: amLODipine-benazepril (LOTREL) 10-40 MG capsule  Preferred Pharmacy:   [place], [place] [employer] [phone] (Phone) [phone] (Fax)    Pt was advised that RX refills may take up to 3 business days. We ask that you follow-up with your pharmacy.

## 2018-11-20 ENCOUNTER — Telehealth: Payer: Self-pay | Admitting: Pulmonary Disease

## 2018-11-20 NOTE — Telephone Encounter (Signed)
Left message for patient's daughter to call back. 

## 2018-11-21 MED ORDER — STIOLTO RESPIMAT 2.5-2.5 MCG/ACT IN AERS: 2.0000 | INHALATION_SPRAY | Freq: Every day | RESPIRATORY_TRACT | 0 refills | Status: DC

## 2018-11-21 NOTE — Telephone Encounter (Signed)
I spoke with pt's daughter and advised her that I would leave samples of Stiolto downstairs for her to pick up. She states the Stiolto is now over 100 dollars and she is now in the doughnut hole. I advised her to make an appt with Dr. Vaughan Browner to discuss other formulary options. She made an appt for 12/10/2018 at 10:30 with Dr. Vaughan Browner. Nothing further is needed.

## 2018-11-30 DIAGNOSIS — M6281 Muscle weakness (generalized): Secondary | ICD-10-CM | POA: Diagnosis not present

## 2018-11-30 DIAGNOSIS — Z96642 Presence of left artificial hip joint: Secondary | ICD-10-CM | POA: Diagnosis not present

## 2018-11-30 DIAGNOSIS — Z471 Aftercare following joint replacement surgery: Secondary | ICD-10-CM | POA: Diagnosis not present

## 2018-11-30 DIAGNOSIS — R269 Unspecified abnormalities of gait and mobility: Secondary | ICD-10-CM | POA: Diagnosis not present

## 2018-12-06 ENCOUNTER — Telehealth: Payer: Self-pay

## 2018-12-06 MED ORDER — HYDRALAZINE HCL 25 MG PO TABS: 25.0000 mg | ORAL_TABLET | Freq: Three times a day (TID) | ORAL | 0 refills | Status: DC

## 2018-12-06 MED ORDER — HYDRALAZINE HCL 25 MG PO TABS: 25.0000 mg | ORAL_TABLET | Freq: Three times a day (TID) | ORAL | 3 refills | Status: DC

## 2018-12-06 NOTE — Telephone Encounter (Signed)
Copied from [place] [id number]. Topic: General - Other >> Dec 06, 2018  9:31 AM Leward Quan A wrote: Reason for CRM: Patient daughter called to request an Rx for a 30 day supply of hydrALAZINE (APRESOLINE) 25 MG tablet [id number] [place], [address] [phone] (Phone) [phone] (Fax)  and the balance of the medication sent to [place], [place] [phone] (Phone) [phone] (Fax)  Per daughter patient have only enough until Monday 12/09/2018 and ask that someone contact patient to inform her that Rx was sent Ph# [phone]

## 2018-12-10 ENCOUNTER — Ambulatory Visit: Payer: Medicare Other | Admitting: Pulmonary Disease

## 2018-12-10 ENCOUNTER — Encounter: Payer: Self-pay | Admitting: Pulmonary Disease

## 2018-12-10 ENCOUNTER — Other Ambulatory Visit: Payer: Self-pay

## 2018-12-10 VITALS — BP 128/74 | HR 77 | Temp 98.1°F | Ht 64.0 in | Wt 211.8 lb

## 2018-12-10 DIAGNOSIS — Z23 Encounter for immunization: Secondary | ICD-10-CM

## 2018-12-10 DIAGNOSIS — J449 Chronic obstructive pulmonary disease, unspecified: Secondary | ICD-10-CM

## 2018-12-10 NOTE — Addendum Note (Signed)
Addended by: Hildred Alamin I on: 12/10/2018 12:30 PM   Modules accepted: Orders

## 2018-12-10 NOTE — Patient Instructions (Signed)
I am glad you are doing well with regard to the breathing Continue the Stiolto inhaler We will give you Pneumovax today Follow-up in 6 months

## 2018-12-10 NOTE — Progress Notes (Signed)
Wendy Torres    [id number]    13-Aug-1940  Primary Care Physician:John, Hunt Oris, MD  Referring Physician: Biagio Borg, MD [place],  [place] [id number]  Chief complaint: Follow-up for COPD  HPI: 78 year old with history of emphysema, hypertension, diastolic heart failure.  Referred here for evaluation of emphysema Complaints of dyspnea with activity, no symptoms at rest.  Denies any cough, sputum production, wheezing She was hospitalized in April 2018 for COPD exacerbation, CHF exacerbation and has been on supplemental oxygen since then Maintained on Symbicort which she uses twice daily.  States that this helps with the breathing.  She has an albuterol rescue inhaler  Pets: No pets  Occupation: Homemaker Exposures: No known exposures.  Reports a mildew problem in the basement but she hardly goes there Smoking history: 25-pack-year smoker.  Quit in 1974 Travel history: No significant travel Relevant family history: Father had emphysema  Interim history: Symbicort changed to [employer] at last visit.  She is stable on this with no issues  Outpatient Encounter Medications as of 12/10/2018  Medication Sig  . acetaminophen (TYLENOL) 325 MG tablet Take 2 tablets (650 mg total) by mouth every 6 (six) hours as needed for mild pain (or Fever >/= 101).  Marland Kitchen ALPRAZolam (XANAX) 0.25 MG tablet TAKE 1 TABLET BY MOUTH TWICE A DAY as needed  . amLODipine-benazepril (LOTREL) 10-40 MG capsule Take 1 capsule by mouth daily.  . bisacodyl (DULCOLAX) 10 MG suppository Place 1 suppository (10 mg total) rectally daily as needed for moderate constipation.  . cetirizine (ZYRTEC) 10 MG tablet TAKE 1 TABLET BY MOUTH EVERY DAY  . clobetasol cream (TEMOVATE) 7.20 % Apply 1 application topically 2 (two) times daily.  . cloNIDine (CATAPRES) 0.3 MG tablet TAKE 1 TABLET BY MOUTH TWO  TIMES DAILY  . docusate sodium 100 MG CAPS Take 100 mg by mouth 2 (two) times daily.  . furosemide  (LASIX) 20 MG tablet Take 1 tablet (20 mg total) by mouth every morning.  . gabapentin (NEURONTIN) 300 MG capsule TAKE 1 CAPSULE BY MOUTH THREE TIMES A DAY  . hydrALAZINE (APRESOLINE) 25 MG tablet Take 1 tablet (25 mg total) by mouth 3 (three) times daily.  . hydrocortisone-pramoxine (ANALPRAM HC) 2.5-1 % rectal cream Apply rectally twice daily.  Marland Kitchen lovastatin (MEVACOR) 40 MG tablet TAKE 1 TABLET BY MOUTH AT  BEDTIME  . nystatin (MYCOSTATIN) 100000 UNIT/ML suspension TAKE 5MLS IN THE MOUTH OR THROAT 4 TIMES A DAY AS NEEDED  . oxybutynin (DITROPAN) 5 MG tablet Take 1 tablet (5 mg total) by mouth 3 (three) times daily.  . pantoprazole (PROTONIX) 40 MG tablet Take 1 tablet (40 mg total) by mouth daily.  Vladimir Faster Glycol-Propyl Glycol (SYSTANE OP) Place 1 drop into both eyes. Three to four times daily.  . polyethylene glycol (MIRALAX / GLYCOLAX) packet Take 17 g by mouth daily as needed for mild constipation.  Marland Kitchen PROAIR HFA 108 (90 Base) MCG/ACT inhaler TAKE 2 PUFFS BY MOUTH EVERY 6 HOURS AS NEEDED FOR WHEEZE OR SHORTNESS OF BREATH  . Tiotropium Bromide-Olodaterol (STIOLTO RESPIMAT) 2.5-2.5 MCG/ACT AERS Inhale 2 puffs into the lungs daily.  Marland Kitchen tiZANidine (ZANAFLEX) 4 MG tablet TAKE 1 TABLET (4 MG TOTAL) BY MOUTH EVERY 6 (SIX) HOURS AS NEEDED FOR MUSCLE SPASMS.  Marland Kitchen traMADol (ULTRAM) 50 MG tablet TAKE 1-2 TABLETS (50-100 MG TOTAL) BY MOUTH EVERY 6 (SIX) HOURS AS NEEDED. FOR PAIN  . traZODone (DESYREL)  50 MG tablet TAKE 1/2 TO 1 TABLET BY MOUTH AT BEDTIME AS NEEDED FOR SLEEP  . SYMBICORT 160-4.5 MCG/ACT inhaler TAKE 2 PUFFS BY MOUTH TWICE A DAY (Patient not taking: Reported on 12/10/2018)  . [DISCONTINUED] lansoprazole (PREVACID) 30 MG capsule Take 1 capsule (30 mg total) by mouth daily.   Facility-Administered Encounter Medications as of 12/10/2018  Medication  . tranexamic acid (CYKLOKAPRON) 2,000 mg in sodium chloride 0.9 % 50 mL Topical Application   Physical Exam: Blood pressure 128/74, pulse 77,  temperature 98.1 F (36.7 C), temperature source Oral, height 5\' 4"  (1.626 m), weight 211 lb 12.8 oz (96.1 kg), SpO2 96 %. Gen:      No acute distress HEENT:  EOMI, sclera anicteric Neck:     No masses; no thyromegaly Lungs:    Clear to auscultation bilaterally; normal respiratory effort CV:         Regular rate and rhythm; no murmurs Abd:      + bowel sounds; soft, non-tender; no palpable masses, no distension Ext:    No edema; adequate peripheral perfusion Skin:      Warm and dry; no rash Neuro: alert and oriented x 3 Psych: normal mood and affect  Data Reviewed: Imaging CT chest 10/17/2008- centrilobular emphysema in the upper lobes, 2 mm nodule in the right lung.  Chest x-ray 08/25/2017- no acute cardiopulmonary abnormality I have reviewed the images personally  PFTs  12/21/2017 FVC 1.75 [9%), FEV1 1.10 [73%], F/F 63, TLC 94%, DLCO 41% Moderate obstruction, severe diffusion defect.  6-minute walk test 02/04/2018 96 m, Patient had to stop for desats.  Titrated to 3 L oxygen  Labs CBC 02/04/2018-WBC 7.4, eos 2.5%, absolute eosinophil count 185 Alpha-1 antitrypsin 12/05/2017-159, PI MM IgE 12/05/2017-224  Assessment:  COPD CT scan from 2010 reviewed which shows emphysematous changes in the upper lobes.   PFTs reviewed which show moderate obstruction with no significant bronchodilator response.  Currently on Stiolto with stable symptoms. I do not believe she will need inhaled corticosteroid as suspicion for asthma is low, peripheral eosinophils are low.  Continue supplemental oxygen with exercise and at night.  Health maintenance 12/03/2013-Prevnar 09/29/2008-Pneumovax.  Repeat Pneumovax test today  Plan/Recommendations: - Stiolto - Continue supplemental oxygen - Pneumovax vaccine.  Marshell Garfinkel MD [facility] 12/10/2018, 11:03 AM  CC: Biagio Borg, MD

## 2018-12-10 NOTE — Progress Notes (Signed)
Wendy Torres    [id number]    [birth year]  Primary Care Physician:John, Hunt Oris, MD  Referring Physician: Biagio Borg, MD [place],  [place] [address]  Chief complaint: Follow-up for COPD  HPI: 78 year old with history of emphysema, hypertension, diastolic heart failure.  Referred here for evaluation of emphysema Complaints of dyspnea with activity, no symptoms at rest.  Denies any cough, sputum production, wheezing She was hospitalized in April 2018 for COPD exacerbation, CHF exacerbation and has been on supplemental oxygen since then Maintained on Symbicort which she uses twice daily.  States that this helps with the breathing.  She has an albuterol rescue inhaler  Pets: No pets  Occupation: Homemaker Exposures: No known exposures.  Reports a mildew problem in the basement but she hardly goes there Smoking history: 25-pack-year smoker.  Quit in 1974 Travel history: No significant travel Relevant family history: Father had emphysema . Interim history: Continues on Symbicort.  States that her breathing is doing fine.  She hardly needs to use her rescue inhaler  Outpatient Encounter Medications as of 12/10/2018  Medication Sig   acetaminophen (TYLENOL) 325 MG tablet Take 2 tablets (650 mg total) by mouth every 6 (six) hours as needed for mild pain (or Fever >/= 101).   ALPRAZolam (XANAX) 0.25 MG tablet TAKE 1 TABLET BY MOUTH TWICE A DAY as needed   amLODipine-benazepril (LOTREL) 10-40 MG capsule Take 1 capsule by mouth daily.   bisacodyl (DULCOLAX) 10 MG suppository Place 1 suppository (10 mg total) rectally daily as needed for moderate constipation.   cetirizine (ZYRTEC) 10 MG tablet TAKE 1 TABLET BY MOUTH EVERY DAY   clobetasol cream (TEMOVATE) 8.11 % Apply 1 application topically 2 (two) times daily.   cloNIDine (CATAPRES) 0.3 MG tablet TAKE 1 TABLET BY MOUTH TWO  TIMES DAILY   docusate sodium 100 MG CAPS Take 100 mg by mouth 2 (two) times  daily.   furosemide (LASIX) 20 MG tablet Take 1 tablet (20 mg total) by mouth every morning.   gabapentin (NEURONTIN) 300 MG capsule TAKE 1 CAPSULE BY MOUTH THREE TIMES A DAY   hydrALAZINE (APRESOLINE) 25 MG tablet Take 1 tablet (25 mg total) by mouth 3 (three) times daily.   hydrocortisone-pramoxine (ANALPRAM HC) 2.5-1 % rectal cream Apply rectally twice daily.   lovastatin (MEVACOR) 40 MG tablet TAKE 1 TABLET BY MOUTH AT  BEDTIME   nystatin (MYCOSTATIN) 100000 UNIT/ML suspension TAKE 5MLS IN THE MOUTH OR THROAT 4 TIMES A DAY AS NEEDED   oxybutynin (DITROPAN) 5 MG tablet Take 1 tablet (5 mg total) by mouth 3 (three) times daily.   pantoprazole (PROTONIX) 40 MG tablet Take 1 tablet (40 mg total) by mouth daily.   Polyethyl Glycol-Propyl Glycol (SYSTANE OP) Place 1 drop into both eyes. Three to four times daily.   polyethylene glycol (MIRALAX / GLYCOLAX) packet Take 17 g by mouth daily as needed for mild constipation.   PROAIR HFA 108 (90 Base) MCG/ACT inhaler TAKE 2 PUFFS BY MOUTH EVERY 6 HOURS AS NEEDED FOR WHEEZE OR SHORTNESS OF BREATH   Tiotropium Bromide-Olodaterol (STIOLTO RESPIMAT) 2.5-2.5 MCG/ACT AERS Inhale 2 puffs into the lungs daily.   tiZANidine (ZANAFLEX) 4 MG tablet TAKE 1 TABLET (4 MG TOTAL) BY MOUTH EVERY 6 (SIX) HOURS AS NEEDED FOR MUSCLE SPASMS.   traMADol (ULTRAM) 50 MG tablet TAKE 1-2 TABLETS (50-100 MG TOTAL) BY MOUTH EVERY 6 (SIX) HOURS AS NEEDED. FOR PAIN  traZODone (DESYREL) 50 MG tablet TAKE 1/2 TO 1 TABLET BY MOUTH AT BEDTIME AS NEEDED FOR SLEEP   SYMBICORT 160-4.5 MCG/ACT inhaler TAKE 2 PUFFS BY MOUTH TWICE A DAY (Patient not taking: Reported on 12/10/2018)   [DISCONTINUED] lansoprazole (PREVACID) 30 MG capsule Take 1 capsule (30 mg total) by mouth daily.   Facility-Administered Encounter Medications as of 12/10/2018  Medication   tranexamic acid (CYKLOKAPRON) 2,000 mg in sodium chloride 0.9 % 50 mL Topical Application   Physical Exam: Blood  pressure 140/78, pulse 77, height 5\' 4"  (1.626 m), weight 208 lb (94.3 kg), SpO2 92 %. Gen:      No acute distress HEENT:  EOMI, sclera anicteric Neck:     No masses; no thyromegaly Lungs:    Clear to auscultation bilaterally; normal respiratory effort CV:         Regular rate and rhythm; no murmurs Abd:      + bowel sounds; soft, non-tender; no palpable masses, no distension Ext:    No edema; adequate peripheral perfusion Skin:      Warm and dry; no rash Neuro: alert and oriented x 3 Psych: normal mood and affect  Data Reviewed: Imaging CT chest 10/17/2008- centrilobular emphysema in the upper lobes, 2 mm nodule in the right lung.  Chest x-ray 08/25/2017- no acute cardiopulmonary abnormality I have reviewed the images personally  PFTs  12/21/2017 FVC 1.75 [9%), FEV1 1.10 [73%], F/F 63, TLC 94%, DLCO 41% Moderate obstruction, severe diffusion defect.  6-minute walk test 02/04/2018 96 m, Patient had to stop for desats.  Titrated to 3 L oxygen  Labs CBC 02/04/2018-WBC 7.4, eos 2.5%, absolute eosinophil count 185 Alpha-1 antitrypsin 12/05/2017-159, PI MM IgE 12/05/2017-224  Assessment:  COPD CT scan from 2010 reviewed which shows emphysematous changes in the upper lobes.   PFTs reviewed which show moderate obstruction with no significant bronchodilator response.  She is currently on Symbicort.  We will give her a sample of Stiolto, LABA/LAMA inhaler.  If she likes this better than we can call in a prescription I do not believe she will need inhaled corticosteroid as suspicion for asthma is low, peripheral eosinophils are low.  Continue supplemental oxygen with exercise and at night.  Health maintenance 12/03/2013-Prevnar 09/29/2008-Pneumovax  Plan/Recommendations: - Sample of Stiolto - Continue supplemental oxygen - Flu vaccine.  Marshell Garfinkel MD [facility] 12/10/2018, 11:07 AM  CC: Biagio Borg, MD

## 2018-12-12 ENCOUNTER — Other Ambulatory Visit: Payer: Self-pay | Admitting: Internal Medicine

## 2018-12-12 NOTE — Telephone Encounter (Signed)
Done erx - 1 mo only  Please let pt know, needs ROV for further refills

## 2018-12-17 ENCOUNTER — Telehealth: Payer: Self-pay | Admitting: Pulmonary Disease

## 2018-12-17 ENCOUNTER — Telehealth: Payer: Self-pay | Admitting: Internal Medicine

## 2018-12-17 ENCOUNTER — Other Ambulatory Visit: Payer: Self-pay | Admitting: Internal Medicine

## 2018-12-17 MED ORDER — HYDRALAZINE HCL 25 MG PO TABS: 25.0000 mg | ORAL_TABLET | Freq: Three times a day (TID) | ORAL | 2 refills | Status: DC

## 2018-12-17 NOTE — Telephone Encounter (Signed)
hydrALAZINE (APRESOLINE) 25 MG tablet  pt's meds are being shipped from Mail Order maybe today for pt. Pt has no more bp meds and would like for Dr Jenny Reichmann to call some in to her [facility] until she get her meds. Please call pt's daughter Hassan Rowan to let her know the status

## 2018-12-17 NOTE — Telephone Encounter (Signed)
Called and spoke with pt's daughter Hassan Rowan. Asked Hassan Rowan if an Rx had ever been called in to pharmacy for [employer] and Hassan Rowan said that it had not. Stated to [place] that we really needed to send Rx to pharmacy and she said that pt told her that she would not be able to afford the Stiolto with either mail order pharmacy or local pharmacy. I stated to Hassan Rowan to contact insurance company to have them provide her with a formulary list so we can see if there is any inhaler that is cheaper for pt than the Stiolto and she verbalized understanding. Will await a return call from pt's daughter Hassan Rowan.

## 2018-12-31 DIAGNOSIS — M6281 Muscle weakness (generalized): Secondary | ICD-10-CM | POA: Diagnosis not present

## 2018-12-31 DIAGNOSIS — Z471 Aftercare following joint replacement surgery: Secondary | ICD-10-CM | POA: Diagnosis not present

## 2018-12-31 DIAGNOSIS — Z96642 Presence of left artificial hip joint: Secondary | ICD-10-CM | POA: Diagnosis not present

## 2018-12-31 DIAGNOSIS — R269 Unspecified abnormalities of gait and mobility: Secondary | ICD-10-CM | POA: Diagnosis not present

## 2019-01-02 ENCOUNTER — Telehealth: Payer: Self-pay | Admitting: Pulmonary Disease

## 2019-01-02 NOTE — Telephone Encounter (Signed)
Spoke with daughter, tomorrow they are supposed to have an exterminator come to their house and they are concerned that the chemicals/odor will effect the pt. They would like to know if they should cancel their appt since the pt is on oxygen and woried about it effecting her breathing. They will be spraying in the house so they wanted to make sure it was ok. Aaron Edelman please advise, Dr. Vaughan Browner has left for the day.

## 2019-01-02 NOTE — Telephone Encounter (Signed)
This is difficult to fully say.  If patient does have a history of having bronchospasms based off of smells, odors then yes this definitely could affect patient's breathing.  Another option could be that if the exterminator does come out to the house that the patient can be relocated to another home/living environment for 24 to 48 hours.  This would obviously require the patient's oxygen concentrator to also be moved.  This is a classic benefits versus risk case scenario.  If the patient has a an active infestation requiring exterminators to come then likely she probably still needs to proceed forward with this and needs to avoid being around the sprain the best that she can.  If this is just a standard extermination visit with no signs of acute infestation and patient has no other living area to go to for 24 to 48 hours then they may need to consider delaying this.  Ultimately, this decision lies with them and whether or not they are able to coordinate the patient's care.  The simple answer is yes an odor or chemical can definitely cause of bronchospasm and affects perfect breathing.  Wyn Quaker, FNP

## 2019-01-02 NOTE — Telephone Encounter (Signed)
Spoke with Wendy Torres and notified of recs per Aaron Edelman  She verbalized understanding  Will worl out a plan to relocate pt while the extermination happens  Nothing further needed

## 2019-01-17 ENCOUNTER — Telehealth: Payer: Self-pay | Admitting: Pulmonary Disease

## 2019-01-17 MED ORDER — STIOLTO RESPIMAT 2.5-2.5 MCG/ACT IN AERS: 2.0000 | INHALATION_SPRAY | Freq: Every day | RESPIRATORY_TRACT | 0 refills | Status: DC

## 2019-01-17 NOTE — Telephone Encounter (Signed)
Patient' mom is in donut hole. This is month number 2. I told patient I could give her a 2 week sample and send a message to Dr. Vaughan Browner to see if there is any other inhaler to chose from that when patient hits the donut hole might not be as expensive.   Sample placed up front for daughter to pick up today   Dr. Vaughan Browner please advise.

## 2019-01-23 ENCOUNTER — Other Ambulatory Visit: Payer: Self-pay | Admitting: Internal Medicine

## 2019-01-23 NOTE — Telephone Encounter (Signed)
Done erx  Please let pt know that I cannot do further refills after this without a ROV, thanks

## 2019-01-30 DIAGNOSIS — Z96642 Presence of left artificial hip joint: Secondary | ICD-10-CM | POA: Diagnosis not present

## 2019-01-30 DIAGNOSIS — R269 Unspecified abnormalities of gait and mobility: Secondary | ICD-10-CM | POA: Diagnosis not present

## 2019-01-30 DIAGNOSIS — Z471 Aftercare following joint replacement surgery: Secondary | ICD-10-CM | POA: Diagnosis not present

## 2019-01-30 DIAGNOSIS — M6281 Muscle weakness (generalized): Secondary | ICD-10-CM | POA: Diagnosis not present

## 2019-02-05 ENCOUNTER — Ambulatory Visit: Payer: Medicare Other | Admitting: Internal Medicine

## 2019-02-06 ENCOUNTER — Other Ambulatory Visit: Payer: Self-pay

## 2019-02-06 ENCOUNTER — Ambulatory Visit (INDEPENDENT_AMBULATORY_CARE_PROVIDER_SITE_OTHER): Payer: Medicare Other | Admitting: Internal Medicine

## 2019-02-06 ENCOUNTER — Other Ambulatory Visit (INDEPENDENT_AMBULATORY_CARE_PROVIDER_SITE_OTHER): Payer: Medicare Other

## 2019-02-06 ENCOUNTER — Encounter: Payer: Self-pay | Admitting: Internal Medicine

## 2019-02-06 VITALS — BP 158/78 | HR 78 | Temp 98.4°F | Resp 20 | Ht 64.0 in | Wt 216.0 lb

## 2019-02-06 DIAGNOSIS — I5032 Chronic diastolic (congestive) heart failure: Secondary | ICD-10-CM

## 2019-02-06 DIAGNOSIS — D539 Nutritional anemia, unspecified: Secondary | ICD-10-CM

## 2019-02-06 DIAGNOSIS — I10 Essential (primary) hypertension: Secondary | ICD-10-CM

## 2019-02-06 DIAGNOSIS — R072 Precordial pain: Secondary | ICD-10-CM

## 2019-02-06 DIAGNOSIS — M159 Polyosteoarthritis, unspecified: Secondary | ICD-10-CM

## 2019-02-06 DIAGNOSIS — K2100 Gastro-esophageal reflux disease with esophagitis, without bleeding: Secondary | ICD-10-CM | POA: Diagnosis not present

## 2019-02-06 DIAGNOSIS — R7303 Prediabetes: Secondary | ICD-10-CM

## 2019-02-06 DIAGNOSIS — M8949 Other hypertrophic osteoarthropathy, multiple sites: Secondary | ICD-10-CM

## 2019-02-06 DIAGNOSIS — Z0000 Encounter for general adult medical examination without abnormal findings: Secondary | ICD-10-CM | POA: Diagnosis not present

## 2019-02-06 DIAGNOSIS — E785 Hyperlipidemia, unspecified: Secondary | ICD-10-CM | POA: Diagnosis not present

## 2019-02-06 DIAGNOSIS — D509 Iron deficiency anemia, unspecified: Secondary | ICD-10-CM

## 2019-02-06 DIAGNOSIS — R778 Other specified abnormalities of plasma proteins: Secondary | ICD-10-CM

## 2019-02-06 DIAGNOSIS — Z0001 Encounter for general adult medical examination with abnormal findings: Secondary | ICD-10-CM

## 2019-02-06 LAB — IBC PANEL
Iron: 47 ug/dL (ref 42–145)
Saturation Ratios: 13.5 % — ABNORMAL LOW (ref 20.0–50.0)
Transferrin: 249 mg/dL (ref 212.0–360.0)

## 2019-02-06 LAB — CBC WITH DIFFERENTIAL/PLATELET
Basophils Absolute: 0.1 10*3/uL (ref 0.0–0.1)
Basophils Relative: 0.9 % (ref 0.0–3.0)
Eosinophils Absolute: 0.2 10*3/uL (ref 0.0–0.7)
Eosinophils Relative: 2.2 % (ref 0.0–5.0)
HCT: 36.4 % (ref 36.0–46.0)
Hemoglobin: 11.9 g/dL — ABNORMAL LOW (ref 12.0–15.0)
Lymphocytes Relative: 33.3 % (ref 12.0–46.0)
Lymphs Abs: 2.5 10*3/uL (ref 0.7–4.0)
MCHC: 32.8 g/dL (ref 30.0–36.0)
MCV: 82.1 fl (ref 78.0–100.0)
Monocytes Absolute: 0.8 10*3/uL (ref 0.1–1.0)
Monocytes Relative: 9.9 % (ref 3.0–12.0)
Neutro Abs: 4.1 10*3/uL (ref 1.4–7.7)
Neutrophils Relative %: 53.7 % (ref 43.0–77.0)
Platelets: 309 10*3/uL (ref 150.0–400.0)
RBC: 4.44 Mil/uL (ref 3.87–5.11)
RDW: 15.2 % (ref 11.5–15.5)
WBC: 7.6 10*3/uL (ref 4.0–10.5)

## 2019-02-06 LAB — HEPATIC FUNCTION PANEL
ALT: 9 U/L (ref 0–35)
AST: 14 U/L (ref 0–37)
Albumin: 4.3 g/dL (ref 3.5–5.2)
Alkaline Phosphatase: 88 U/L (ref 39–117)
Bilirubin, Direct: 0.1 mg/dL (ref 0.0–0.3)
Total Bilirubin: 0.3 mg/dL (ref 0.2–1.2)
Total Protein: 8.5 g/dL — ABNORMAL HIGH (ref 6.0–8.3)

## 2019-02-06 LAB — URINALYSIS, ROUTINE W REFLEX MICROSCOPIC
Bilirubin Urine: NEGATIVE
Hgb urine dipstick: NEGATIVE
Ketones, ur: NEGATIVE
Nitrite: NEGATIVE
RBC / HPF: NONE SEEN (ref 0–?)
Specific Gravity, Urine: <=1.005 — AB (ref 1.000–1.030)
Total Protein, Urine: NEGATIVE
Urine Glucose: NEGATIVE
Urobilinogen, UA: 0.2 (ref 0.0–1.0)
pH: 7 (ref 5.0–8.0)

## 2019-02-06 LAB — BASIC METABOLIC PANEL
BUN: 10 mg/dL (ref 6–23)
CO2: 27 mEq/L (ref 19–32)
Calcium: 10.9 mg/dL — ABNORMAL HIGH (ref 8.4–10.5)
Chloride: 100 mEq/L (ref 96–112)
Creatinine, Ser: 0.97 mg/dL (ref 0.40–1.20)
GFR: 66.86 mL/min (ref >60.00)
Glucose, Bld: 98 mg/dL (ref 70–99)
Potassium: 4.2 mEq/L (ref 3.5–5.1)
Sodium: 136 mEq/L (ref 135–145)

## 2019-02-06 LAB — LIPID PANEL
Cholesterol: 157 mg/dL (ref 0–200)
HDL: 54.5 mg/dL (ref >39.00)
LDL Cholesterol: 83 mg/dL (ref 0–99)
NonHDL: 102.73
Total CHOL/HDL Ratio: 3
Triglycerides: 97 mg/dL (ref 0.0–149.0)
VLDL: 19.4 mg/dL (ref 0.0–40.0)

## 2019-02-06 LAB — TSH: TSH: 0.89 u[IU]/mL (ref 0.35–4.50)

## 2019-02-06 LAB — FOLATE: Folate: 12.8 ng/mL (ref >5.9)

## 2019-02-06 LAB — FERRITIN: Ferritin: 25 ng/mL (ref 10.0–291.0)

## 2019-02-06 LAB — VITAMIN B12: Vitamin B-12: 448 pg/mL (ref 211–911)

## 2019-02-06 LAB — HEMOGLOBIN A1C: Hgb A1c MFr Bld: 6.3 % (ref 4.6–6.5)

## 2019-02-06 LAB — BRAIN NATRIURETIC PEPTIDE: Pro B Natriuretic peptide (BNP): 33 pg/mL (ref 0.0–100.0)

## 2019-02-06 LAB — TROPONIN I (HIGH SENSITIVITY): High Sens Troponin I: 4 ng/L (ref 2–17)

## 2019-02-06 MED ORDER — TRAMADOL HCL 50 MG PO TABS: 50.0000 mg | ORAL_TABLET | Freq: Four times a day (QID) | ORAL | 0 refills | Status: DC | PRN

## 2019-02-06 NOTE — Telephone Encounter (Signed)
We saw pt today. She requested refill of tramadol. Was to send to PCP however, PCP will be out of the office until Tuesday.   Per database rx last filled on 12/12/2018 for #[phone]

## 2019-02-06 NOTE — Patient Instructions (Addendum)
Preventive Care 38 Years and Older, Female Preventive care refers to lifestyle choices and visits with your health care provider that can promote health and wellness. This includes:  A yearly physical exam. This is also called an annual well check.  Regular dental and eye exams.  Immunizations.  Screening for certain conditions.  Healthy lifestyle choices, such as diet and exercise. What can I expect for my preventive care visit? Physical exam Your health care provider will check:  Height and weight. These may be used to calculate body mass index (BMI), which is a measurement that tells if you are at a healthy weight.  Heart rate and blood pressure.  Your skin for abnormal spots. Counseling Your health care provider may ask you questions about:  Alcohol, tobacco, and drug use.  Emotional well-being.  Home and relationship well-being.  Sexual activity.  Eating habits.  History of falls.  Memory and ability to understand (cognition).  Work and work Statistician.  Pregnancy and menstrual history. What immunizations do I need?  Influenza (flu) vaccine  This is recommended every year. Tetanus, diphtheria, and pertussis (Tdap) vaccine  You may need a Td booster every 10 years. Varicella (chickenpox) vaccine  You may need this vaccine if you have not already been vaccinated. Zoster (shingles) vaccine  You may need this after age 33. Pneumococcal conjugate (PCV13) vaccine  One dose is recommended after age 33. Pneumococcal polysaccharide (PPSV23) vaccine  One dose is recommended after age 72. Measles, mumps, and rubella (MMR) vaccine  You may need at least one dose of MMR if you were born in 1957 or later. You may also need a second dose. Meningococcal conjugate (MenACWY) vaccine  You may need this if you have certain conditions. Hepatitis A vaccine  You may need this if you have certain conditions or if you travel or work in places where you may be exposed  to hepatitis A. Hepatitis B vaccine  You may need this if you have certain conditions or if you travel or work in places where you may be exposed to hepatitis B. Haemophilus influenzae type b (Hib) vaccine  You may need this if you have certain conditions. You may receive vaccines as individual doses or as more than one vaccine together in one shot (combination vaccines). Talk with your health care provider about the risks and benefits of combination vaccines. What tests do I need? Blood tests  Lipid and cholesterol levels. These may be checked every 5 years, or more frequently depending on your overall health.  Hepatitis C test.  Hepatitis B test. Screening  Lung cancer screening. You may have this screening every year starting at age 39 if you have a 30-pack-year history of smoking and currently smoke or have quit within the past 15 years.  Colorectal cancer screening. All adults should have this screening starting at age 36 and continuing until age 15. Your health care provider may recommend screening at age 23 if you are at increased risk. You will have tests every 1-10 years, depending on your results and the type of screening test.  Diabetes screening. This is done by checking your blood sugar (glucose) after you have not eaten for a while (fasting). You may have this done every 1-3 years.  Mammogram. This may be done every 1-2 years. Talk with your health care provider about how often you should have regular mammograms.  BRCA-related cancer screening. This may be done if you have a family history of breast, ovarian, tubal, or peritoneal cancers.  Other tests  Sexually transmitted disease (STD) testing.  Bone density scan. This is done to screen for osteoporosis. You may have this done starting at age 97. Follow these instructions at home: Eating and drinking  Eat a diet that includes fresh fruits and vegetables, whole grains, lean protein, and low-fat dairy products. Limit  your intake of foods with high amounts of sugar, saturated fats, and salt.  Take vitamin and mineral supplements as recommended by your health care provider.  Do not drink alcohol if your health care provider tells you not to drink.  If you drink alcohol: ? Limit how much you have to 0-1 drink a day. ? Be aware of how much alcohol is in your drink. In the [place] one drink equals one 12 oz bottle of beer (355 mL), one 5 oz glass of wine (148 mL), or one 1 oz glass of hard liquor (44 mL). Lifestyle  Take daily care of your teeth and gums.  Stay active. Exercise for at least 30 minutes on 5 or more days each week.  Do not use any products that contain nicotine or tobacco, such as cigarettes, e-cigarettes, and chewing tobacco. If you need help quitting, ask your health care provider.  If you are sexually active, practice safe sex. Use a condom or other form of protection in order to prevent STIs (sexually transmitted infections).  Talk with your health care provider about taking a low-dose aspirin or statin. What's next?  Go to your health care provider once a year for a well check visit.  Ask your health care provider how often you should have your eyes and teeth checked.  Stay up to date on all vaccines. This information is not intended to replace advice given to you by your health care provider. Make sure you discuss any questions you have with your health care provider. Document Released: 05/14/2015 Document Revised: 04/11/2018 Document Reviewed: 04/11/2018 Elsevier Patient Education  2020 [place] 65 Years and Older, Female Preventive care refers to lifestyle choices and visits with your health care provider that can promote health and wellness. This includes:  A yearly physical exam. This is also called an annual well check.  Regular dental and eye exams.  Immunizations.  Screening for certain conditions.  Healthy lifestyle choices, such as diet and  exercise. What can I expect for my preventive care visit? Physical exam Your health care provider will check:  Height and weight. These may be used to calculate body mass index (BMI), which is a measurement that tells if you are at a healthy weight.  Heart rate and blood pressure.  Your skin for abnormal spots. Counseling Your health care provider may ask you questions about:  Alcohol, tobacco, and drug use.  Emotional well-being.  Home and relationship well-being.  Sexual activity.  Eating habits.  History of falls.  Memory and ability to understand (cognition).  Work and work Statistician.  Pregnancy and menstrual history. What immunizations do I need?  Influenza (flu) vaccine  This is recommended every year. Tetanus, diphtheria, and pertussis (Tdap) vaccine  You may need a Td booster every 10 years. Varicella (chickenpox) vaccine  You may need this vaccine if you have not already been vaccinated. Zoster (shingles) vaccine  You may need this after age 47. Pneumococcal conjugate (PCV13) vaccine  One dose is recommended after age 42. Pneumococcal polysaccharide (PPSV23) vaccine  One dose is recommended after age 56. Measles, mumps, and rubella (MMR) vaccine  You may need at  least one dose of MMR if you were born in 1957 or later. You may also need a second dose. Meningococcal conjugate (MenACWY) vaccine  You may need this if you have certain conditions. Hepatitis A vaccine  You may need this if you have certain conditions or if you travel or work in places where you may be exposed to hepatitis A. Hepatitis B vaccine  You may need this if you have certain conditions or if you travel or work in places where you may be exposed to hepatitis B. Haemophilus influenzae type b (Hib) vaccine  You may need this if you have certain conditions. You may receive vaccines as individual doses or as more than one vaccine together in one shot (combination vaccines). Talk  with your health care provider about the risks and benefits of combination vaccines. What tests do I need? Blood tests  Lipid and cholesterol levels. These may be checked every 5 years, or more frequently depending on your overall health.  Hepatitis C test.  Hepatitis B test. Screening  Lung cancer screening. You may have this screening every year starting at age 27 if you have a 30-pack-year history of smoking and currently smoke or have quit within the past 15 years.  Colorectal cancer screening. All adults should have this screening starting at age 20 and continuing until age 67. Your health care provider may recommend screening at age 85 if you are at increased risk. You will have tests every 1-10 years, depending on your results and the type of screening test.  Diabetes screening. This is done by checking your blood sugar (glucose) after you have not eaten for a while (fasting). You may have this done every 1-3 years.  Mammogram. This may be done every 1-2 years. Talk with your health care provider about how often you should have regular mammograms.  BRCA-related cancer screening. This may be done if you have a family history of breast, ovarian, tubal, or peritoneal cancers. Other tests  Sexually transmitted disease (STD) testing.  Bone density scan. This is done to screen for osteoporosis. You may have this done starting at age 25. Follow these instructions at home: Eating and drinking  Eat a diet that includes fresh fruits and vegetables, whole grains, lean protein, and low-fat dairy products. Limit your intake of foods with high amounts of sugar, saturated fats, and salt.  Take vitamin and mineral supplements as recommended by your health care provider.  Do not drink alcohol if your health care provider tells you not to drink.  If you drink alcohol: ? Limit how much you have to 0-1 drink a day. ? Be aware of how much alcohol is in your drink. In the [place] one drink equals  one 12 oz bottle of beer (355 mL), one 5 oz glass of wine (148 mL), or one 1 oz glass of hard liquor (44 mL). Lifestyle  Take daily care of your teeth and gums.  Stay active. Exercise for at least 30 minutes on 5 or more days each week.  Do not use any products that contain nicotine or tobacco, such as cigarettes, e-cigarettes, and chewing tobacco. If you need help quitting, ask your health care provider.  If you are sexually active, practice safe sex. Use a condom or other form of protection in order to prevent STIs (sexually transmitted infections).  Talk with your health care provider about taking a low-dose aspirin or statin. What's next?  Go to your health care provider once a year for a  well check visit.  Ask your health care provider how often you should have your eyes and teeth checked.  Stay up to date on all vaccines. This information is not intended to replace advice given to you by your health care provider. Make sure you discuss any questions you have with your health care provider. Document Released: 05/14/2015 Document Revised: 04/11/2018 Document Reviewed: 04/11/2018 Elsevier Patient Education  2020 [employer].

## 2019-02-06 NOTE — Progress Notes (Signed)
Subjective:  Patient ID: Wendy Torres, female    DOB: 08/02/1940  Age: 78 y.o. MRN: [id number]  CC: Annual Exam, Hypertension, Anemia, and Congestive Heart Failure  NEW  HPI Wendy Torres presents for a CPX.  She had an episode of chest pain a month ago that she describes as heartburn.  She denies shortness of breath, dizziness, lightheadedness, diaphoresis, or lower extremity edema.  She also denies odynophagia or dysphagia.  She complains of pain in her large joints and wants a refill of tramadol.  Outpatient Medications Prior to Visit  Medication Sig Dispense Refill  . acetaminophen (TYLENOL) 325 MG tablet Take 2 tablets (650 mg total) by mouth every 6 (six) hours as needed for mild pain (or Fever >/= 101). 40 tablet 0  . ALPRAZolam (XANAX) 0.25 MG tablet TAKE 1 TABLET BY MOUTH TWICE A DAY AS NEEDED 60 tablet 0  . amLODipine-benazepril (LOTREL) 10-40 MG capsule TAKE 1 CAPSULE BY MOUTH  DAILY 90 capsule 3  . cetirizine (ZYRTEC) 10 MG tablet TAKE 1 TABLET BY MOUTH EVERY DAY 90 tablet 1  . cloNIDine (CATAPRES) 0.3 MG tablet TAKE 1 TABLET BY MOUTH TWO  TIMES DAILY 180 tablet 3  . furosemide (LASIX) 20 MG tablet Take 1 tablet (20 mg total) by mouth every morning. 90 tablet 1  . gabapentin (NEURONTIN) 300 MG capsule TAKE 1 CAPSULE BY MOUTH THREE TIMES A DAY 270 capsule 1  . hydrALAZINE (APRESOLINE) 25 MG tablet Take 1 tablet (25 mg total) by mouth 3 (three) times daily. 30 tablet 2  . lovastatin (MEVACOR) 40 MG tablet TAKE 1 TABLET BY MOUTH AT  BEDTIME 90 tablet 3  . oxybutynin (DITROPAN) 5 MG tablet Take 1 tablet (5 mg total) by mouth 3 (three) times daily. 270 tablet 1  . pantoprazole (PROTONIX) 40 MG tablet Take 1 tablet (40 mg total) by mouth daily. 90 tablet 1  . PROAIR HFA 108 (90 Base) MCG/ACT inhaler TAKE 2 PUFFS BY MOUTH EVERY 6 HOURS AS NEEDED FOR WHEEZE OR SHORTNESS OF BREATH 18 g 2  . SYMBICORT 160-4.5 MCG/ACT inhaler TAKE 2 PUFFS BY MOUTH TWICE A DAY 30.6 Inhaler 2  .  Tiotropium Bromide-Olodaterol (STIOLTO RESPIMAT) 2.5-2.5 MCG/ACT AERS Inhale 2 puffs into the lungs daily. 8 g 0  . tiZANidine (ZANAFLEX) 4 MG tablet TAKE 1 TABLET (4 MG TOTAL) BY MOUTH EVERY 6 (SIX) HOURS AS NEEDED FOR MUSCLE SPASMS. 50 tablet 1  . traMADol (ULTRAM) 50 MG tablet TAKE 1-2 TABLETS (50-100 MG TOTAL) BY MOUTH EVERY 6 (SIX) HOURS AS NEEDED. FOR PAIN 120 tablet 0  . traZODone (DESYREL) 50 MG tablet TAKE 1/2 TO 1 TABLET BY MOUTH AT BEDTIME AS NEEDED FOR SLEEP 90 tablet 1  . bisacodyl (DULCOLAX) 10 MG suppository Place 1 suppository (10 mg total) rectally daily as needed for moderate constipation. 12 suppository 0  . clobetasol cream (TEMOVATE) [id number] % Apply 1 application topically 2 (two) times daily. 30 g 0  . docusate sodium 100 MG CAPS Take 100 mg by mouth 2 (two) times daily. 10 capsule 0  . hydrocortisone-pramoxine (ANALPRAM HC) 2.5-1 % rectal cream Apply rectally twice daily. 30 g 1  . nystatin (MYCOSTATIN) 100000 UNIT/ML suspension TAKE 5MLS IN THE MOUTH OR THROAT 4 TIMES A DAY AS NEEDED 60 mL 1  . Polyethyl Glycol-Propyl Glycol (SYSTANE OP) Place 1 drop into both eyes. Three to four times daily.    . polyethylene glycol (MIRALAX / GLYCOLAX) packet Take 17 g by mouth  daily as needed for mild constipation. 14 each 0  . Tiotropium Bromide-Olodaterol (STIOLTO RESPIMAT) 2.5-2.5 MCG/ACT AERS Inhale 2 puffs into the lungs daily. 4 g 0  . tranexamic acid (CYKLOKAPRON) 2,000 mg in sodium chloride 0.9 % 50 mL Topical Application      No facility-administered medications prior to visit.     ROS Review of Systems  Constitutional: Positive for unexpected weight change (wt gain). Negative for chills, diaphoresis and fatigue.  HENT: Negative for sore throat, trouble swallowing and voice change.   Eyes: Negative.   Respiratory: Negative for cough, choking, chest tightness, shortness of breath and wheezing.   Cardiovascular: Negative for chest pain, palpitations and leg swelling.   Gastrointestinal: Negative for abdominal pain, constipation, diarrhea, nausea and vomiting.  Endocrine: Negative.   Genitourinary: Negative.  Negative for difficulty urinating, dysuria, frequency, hematuria and urgency.  Musculoskeletal: Positive for arthralgias. Negative for myalgias.  Skin: Negative.  Negative for color change.  Neurological: Negative.  Negative for dizziness, weakness and light-headedness.  Hematological: Negative for adenopathy. Does not bruise/bleed easily.  Psychiatric/Behavioral: Negative.     Objective:  BP (!) 158/78 (BP Location: Left Arm, Patient Position: Sitting, Cuff Size: Large)   Pulse 78   Temp 98.4 F (36.9 C) (Oral)   Resp 20   Ht 5\' 4"  (1.626 m)   Wt 216 lb (98 kg)   SpO2 94%   BMI 37.08 kg/m   BP Readings from Last 3 Encounters:  02/06/19 (!) 158/78  12/10/18 128/74  02/04/18 140/78    Wt Readings from Last 3 Encounters:  02/06/19 216 lb (98 kg)  12/10/18 211 lb 12.8 oz (96.1 kg)  02/04/18 208 lb (94.3 kg)    Physical Exam Vitals signs reviewed.  Constitutional:      Appearance: She is obese. She is ill-appearing. She is not diaphoretic.     Comments: O2 dependent  HENT:     Nose: Nose normal.     Mouth/Throat:     Mouth: Mucous membranes are moist.     Pharynx: No oropharyngeal exudate.  Eyes:     General: No scleral icterus.    Conjunctiva/sclera: Conjunctivae normal.  Neck:     Musculoskeletal: Neck supple. No neck rigidity.  Cardiovascular:     Rate and Rhythm: Normal rate and regular rhythm.     Pulses:          Carotid pulses are 1+ on the right side and 1+ on the left side.      Radial pulses are 1+ on the right side and 1+ on the left side.       Femoral pulses are 1+ on the right side and 1+ on the left side.      Popliteal pulses are 1+ on the right side and 1+ on the left side.       Dorsalis pedis pulses are 1+ on the right side and 1+ on the left side.       Posterior tibial pulses are 1+ on the right side  and 1+ on the left side.     Heart sounds: No murmur.     Comments: EKG ---  Atrial  Rhythm  P:QRS - 1:1, Abnormal P axis, H Rate 75 WITHIN NORMAL LIMITS  Pulmonary:     Breath sounds: No stridor. No wheezing, rhonchi or rales.  Abdominal:     General: Abdomen is protuberant. Bowel sounds are normal. There is no distension.     Palpations: There is no hepatomegaly, splenomegaly  or mass.     Tenderness: There is no abdominal tenderness.  Genitourinary:    Comments: Breast, GU, rectal exams were deferred at her request. Musculoskeletal: Normal range of motion.        General: No swelling or tenderness.     Right lower leg: No edema.     Left lower leg: No edema.  Skin:    General: Skin is warm and dry.     Coloration: Skin is not pale.  Neurological:     General: No focal deficit present.     Mental Status: She is alert.  Psychiatric:        Mood and Affect: Mood normal.        Behavior: Behavior normal.     Lab Results  Component Value Date   WBC 7.4 12/05/2017   HGB 11.2 (L) 12/05/2017   HCT 34.6 (L) 12/05/2017   PLT 308.0 12/05/2017   GLUCOSE 100 (H) 10/03/2017   CHOL 181 07/18/2017   TRIG 85.0 07/18/2017   HDL 67.50 07/18/2017   LDLCALC 97 07/18/2017   ALT 8 10/03/2017   AST 14 10/03/2017   NA 139 10/03/2017   K 3.9 10/03/2017   CL 102 10/03/2017   CREATININE 0.86 10/03/2017   BUN 8 10/03/2017   CO2 29 10/03/2017   TSH 0.94 07/18/2017   INR 1.02 02/10/2014   HGBA1C 6.3 10/03/2017    Dg Chest 2 View  Result Date: 08/25/2017 CLINICAL DATA:  Patient reports SOB that has been going on for awhile but has recently gotten worse X a couple of days ago. EXAM: CHEST - 2 VIEW COMPARISON:  09/26/2016 FINDINGS: Cardiac silhouette is mildly enlarged. No mediastinal or hilar masses. No convincing adenopathy. There are prominent lung markings similar to the prior study. No convincing pneumonia. No pulmonary edema. No pleural effusion or pneumothorax. Skeletal structures  are demineralized. There old healed rib fractures. IMPRESSION: No acute cardiopulmonary disease. Electronically Signed   By: Lajean Manes M.D.   On: 08/25/2017 14:11    Assessment & Plan:   Arkeisha was seen today for annual exam, hypertension, anemia and congestive heart failure.  Diagnoses and all orders for this visit:  Chronic diastolic congestive heart failure ([place])- Based on her symptoms, exam, EKG, and labs she has a normal volume status.  We will continue the current dose of furosemide. -     Brain natriuretic peptide; Future -     EKG 12-Lead  Essential hypertension- Her blood pressure is adequately well controlled. -     Basic metabolic panel; Future -     Urinalysis, Routine w reflex microscopic; Future  Gastroesophageal reflux disease with esophagitis without hemorrhage- I think this explains her recent episode of chest pain.  I have asked her to be compliant with the PPI.  Deficiency anemia- I will screen her for vitamin deficiencies. -     CBC with Differential/Platelet; Future -     Vitamin B12; Future -     IBC panel; Future -     Ferritin; Future -     Folate; Future -     Reticulocytes; Future -     Vitamin B1; Future  Precordial pain- Based on her symptoms, exam, EKG, and labs I think that this was an episode of pain caused by GERD. -     Brain natriuretic peptide; Future -     Troponin I (High Sensitivity); Future -     EKG 12-Lead  Elevated total protein- I will check  an SPEP to see if she has developed lymphoproliferative disease. -     Protein electrophoresis, serum; Future -     Hepatic function panel; Future  Prediabetes- Her A1c is at 6.3%.  Medical therapy is not indicated. -     Hemoglobin A1c; Future  Iron deficiency anemia, unspecified iron deficiency anemia type -     IBC panel; Future  Dyslipidemia, goal LDL below 130- She has achieved her LDL goal and is doing well on the statin. -     Lipid panel; Future -     TSH; Future -     Hepatic  function panel; Future  Encounter for preventative adult health care exam with abnormal findings- Exam completed, labs reviewed, vaccines reviewed and updated, colon cancer screening is up-to-date.  Mammogram and Pap are not indicated, patient education was given.   I have discontinued [employer]'s Polyethyl Glycol-Propyl Glycol (SYSTANE OP), bisacodyl, DSS, polyethylene glycol, hydrocortisone-pramoxine, clobetasol cream, and nystatin. I am also having her maintain her acetaminophen, Symbicort, cetirizine, pantoprazole, oxybutynin, gabapentin, furosemide, ProAir HFA, tiZANidine, Stiolto Respimat, traZODone, traMADol, hydrALAZINE, lovastatin, cloNIDine, amLODipine-benazepril, and ALPRAZolam. We will stop administering (tranexamic acid (CYKLOKAPRON) 2,000 mg in sodium chloride 0.9 % 50 mL Topical Application).  No orders of the defined types were placed in this encounter.    Follow-up: No follow-ups on file.  Scarlette Calico, MD

## 2019-02-07 ENCOUNTER — Encounter: Payer: Self-pay | Admitting: Internal Medicine

## 2019-02-07 NOTE — Telephone Encounter (Signed)
Dr. Ronnald Ramp has sent in rx as requested.

## 2019-02-10 ENCOUNTER — Other Ambulatory Visit: Payer: Self-pay | Admitting: Internal Medicine

## 2019-02-11 ENCOUNTER — Other Ambulatory Visit: Payer: Self-pay

## 2019-02-11 NOTE — Patient Outreach (Signed)
[facility]) Care Management  02/11/2019  DENIYAH PATCHIN [birth year] [id number]  Medication Adherence call to Mrs. Anslee Byrnes [employer] spoke with patient she is past due on Amlodipine/Benazepril 10/40 mg patient explain she is taking 1 tablet daily and has already order this medication from [facility] ,[facility] is mail it out patient will received it with a couple of days,Mrs. Mcclernon is showing past due under [place] [employer] Ins.   [place] Management Direct Dial [phone]  Fax [phone] [email or web address]

## 2019-02-13 LAB — PROTEIN ELECTROPHORESIS, SERUM
Albumin ELP: 3.9 g/dL (ref 3.8–4.8)
Alpha 1: 0.3 g/dL (ref 0.2–0.3)
Alpha 2: 1 g/dL — ABNORMAL HIGH (ref 0.5–0.9)
Beta 2: 0.6 g/dL — ABNORMAL HIGH (ref 0.2–0.5)
Beta Globulin: 0.6 g/dL (ref 0.4–0.6)
Gamma Globulin: 1.6 g/dL (ref 0.8–1.7)
Total Protein: 8 g/dL (ref 6.1–8.1)

## 2019-02-13 LAB — RETICULOCYTES
ABS Retic: 49500 cells/uL (ref 20000–8000)
Retic Ct Pct: 1.1 %

## 2019-02-13 LAB — VITAMIN B1: Vitamin B1 (Thiamine): 10 nmol/L (ref 8–30)

## 2019-03-02 DIAGNOSIS — Z96642 Presence of left artificial hip joint: Secondary | ICD-10-CM | POA: Diagnosis not present

## 2019-03-02 DIAGNOSIS — R269 Unspecified abnormalities of gait and mobility: Secondary | ICD-10-CM | POA: Diagnosis not present

## 2019-03-02 DIAGNOSIS — Z471 Aftercare following joint replacement surgery: Secondary | ICD-10-CM | POA: Diagnosis not present

## 2019-03-02 DIAGNOSIS — M6281 Muscle weakness (generalized): Secondary | ICD-10-CM | POA: Diagnosis not present

## 2019-03-19 ENCOUNTER — Other Ambulatory Visit: Payer: Self-pay | Admitting: Internal Medicine

## 2019-03-19 DIAGNOSIS — H16223 Keratoconjunctivitis sicca, not specified as Sjogren's, bilateral: Secondary | ICD-10-CM | POA: Diagnosis not present

## 2019-03-19 DIAGNOSIS — M159 Polyosteoarthritis, unspecified: Secondary | ICD-10-CM

## 2019-03-19 DIAGNOSIS — Z961 Presence of intraocular lens: Secondary | ICD-10-CM | POA: Diagnosis not present

## 2019-03-19 DIAGNOSIS — H43813 Vitreous degeneration, bilateral: Secondary | ICD-10-CM | POA: Diagnosis not present

## 2019-03-19 DIAGNOSIS — H353131 Nonexudative age-related macular degeneration, bilateral, early dry stage: Secondary | ICD-10-CM | POA: Diagnosis not present

## 2019-04-01 DIAGNOSIS — Z471 Aftercare following joint replacement surgery: Secondary | ICD-10-CM | POA: Diagnosis not present

## 2019-04-01 DIAGNOSIS — R269 Unspecified abnormalities of gait and mobility: Secondary | ICD-10-CM | POA: Diagnosis not present

## 2019-04-01 DIAGNOSIS — Z96642 Presence of left artificial hip joint: Secondary | ICD-10-CM | POA: Diagnosis not present

## 2019-04-01 DIAGNOSIS — M6281 Muscle weakness (generalized): Secondary | ICD-10-CM | POA: Diagnosis not present

## 2019-04-07 ENCOUNTER — Telehealth: Payer: Self-pay | Admitting: Pulmonary Disease

## 2019-04-07 NOTE — Telephone Encounter (Signed)
Called and spoke with pt's daughter Adonis Huguenin in regards to the stiolto. Stated to her that we could provide pt with samples but pt is also due for an appt so we should schedule for pt as she is due for an appt and Katrina verbalized understanding.  Appt scheduled for pt with Dr. Vaughan Browner tomorrow 12/8 at 2:15. Nothing further needed.

## 2019-04-08 ENCOUNTER — Ambulatory Visit: Payer: Medicare Other | Admitting: Pulmonary Disease

## 2019-04-08 ENCOUNTER — Other Ambulatory Visit: Payer: Self-pay | Admitting: Internal Medicine

## 2019-04-09 ENCOUNTER — Encounter: Payer: Self-pay | Admitting: Pulmonary Disease

## 2019-04-09 ENCOUNTER — Ambulatory Visit (INDEPENDENT_AMBULATORY_CARE_PROVIDER_SITE_OTHER): Payer: Medicare Other | Admitting: Pulmonary Disease

## 2019-04-09 ENCOUNTER — Other Ambulatory Visit: Payer: Self-pay

## 2019-04-09 VITALS — BP 122/78 | HR 78 | Temp 98.5°F | Wt 200.0 lb

## 2019-04-09 DIAGNOSIS — J449 Chronic obstructive pulmonary disease, unspecified: Secondary | ICD-10-CM | POA: Diagnosis not present

## 2019-04-09 MED ORDER — STIOLTO RESPIMAT 2.5-2.5 MCG/ACT IN AERS: 2.0000 | INHALATION_SPRAY | Freq: Every day | RESPIRATORY_TRACT | 0 refills | Status: DC

## 2019-04-09 NOTE — Telephone Encounter (Signed)
I can no longer refill meds including xanax for this pt due to the office refill policy  Pt need OV

## 2019-04-09 NOTE — Progress Notes (Signed)
Wendy Torres    [id number]    01/09/1941  Primary Care Physician:John, Hunt Oris, MD  Referring Physician: Biagio Borg, MD [place],  [place] [address]  Chief complaint: Follow-up for COPD  HPI: 78 year old with history of emphysema, hypertension, diastolic heart failure.  Referred here for evaluation of emphysema Complaints of dyspnea with activity, no symptoms at rest.  Denies any cough, sputum production, wheezing She was hospitalized in April 2018 for COPD exacerbation, CHF exacerbation and has been on supplemental oxygen since then Symbicort changed to [place] in 2019 and she is doing well with this change  Pets: No pets  Occupation: Homemaker Exposures: No known exposures.  Reports a mildew problem in the basement but she hardly goes there Smoking history: 25-pack-year smoker.  Quit in 1974 Travel history: No significant travel Relevant family history: Father had emphysema  Interim history: She is doing well on Stiolto with no issues.  Outpatient Encounter Medications as of 04/09/2019  Medication Sig  . acetaminophen (TYLENOL) 325 MG tablet Take 2 tablets (650 mg total) by mouth every 6 (six) hours as needed for mild pain (or Fever >/= 101).  Marland Kitchen ALPRAZolam (XANAX) 0.25 MG tablet TAKE 1 TABLET BY MOUTH TWICE A DAY AS NEEDED  . amLODipine-benazepril (LOTREL) 10-40 MG capsule TAKE 1 CAPSULE BY MOUTH  DAILY  . cetirizine (ZYRTEC) 10 MG tablet TAKE 1 TABLET BY MOUTH EVERY DAY  . cloNIDine (CATAPRES) 0.3 MG tablet TAKE 1 TABLET BY MOUTH TWO  TIMES DAILY  . furosemide (LASIX) 20 MG tablet TAKE 1 TABLET BY MOUTH IN  THE MORNING  . gabapentin (NEURONTIN) 300 MG capsule TAKE 1 CAPSULE BY MOUTH 3  TIMES DAILY  . hydrALAZINE (APRESOLINE) 25 MG tablet Take 1 tablet (25 mg total) by mouth 3 (three) times daily.  Marland Kitchen lovastatin (MEVACOR) 40 MG tablet TAKE 1 TABLET BY MOUTH AT  BEDTIME  . oxybutynin (DITROPAN) 5 MG tablet TAKE 1 TABLET BY MOUTH 3  TIMES DAILY   . pantoprazole (PROTONIX) 40 MG tablet TAKE 1 TABLET BY MOUTH  DAILY  . PROAIR HFA 108 (90 Base) MCG/ACT inhaler TAKE 2 PUFFS BY MOUTH EVERY 6 HOURS AS NEEDED FOR WHEEZE OR SHORTNESS OF BREATH  . tiZANidine (ZANAFLEX) 4 MG tablet TAKE 1 TABLET (4 MG TOTAL) BY MOUTH EVERY 6 (SIX) HOURS AS NEEDED FOR MUSCLE SPASMS.  Marland Kitchen traMADol (ULTRAM) 50 MG tablet TAKE 1-2 TABLETS (50-100 MG TOTAL) BY MOUTH EVERY 6 (SIX) HOURS AS NEEDED. FOR PAIN  . traZODone (DESYREL) 50 MG tablet TAKE 1/2 TO 1 TABLET BY MOUTH AT BEDTIME AS NEEDED FOR SLEEP  . Tiotropium Bromide-Olodaterol (STIOLTO RESPIMAT) 2.5-2.5 MCG/ACT AERS Inhale 2 puffs into the lungs daily. (Patient not taking: Reported on 04/09/2019)  . [DISCONTINUED] lansoprazole (PREVACID) 30 MG capsule Take 1 capsule (30 mg total) by mouth daily.  . [DISCONTINUED] SYMBICORT 160-4.5 MCG/ACT inhaler TAKE 2 PUFFS BY MOUTH TWICE A DAY   No facility-administered encounter medications on file as of 04/09/2019.    Physical Exam: Blood pressure 122/78, pulse 78, temperature 98.5 F (36.9 C), temperature source Temporal, weight 200 lb (90.7 kg), SpO2 98 %. Gen:      No acute distress HEENT:  EOMI, sclera anicteric Neck:     No masses; no thyromegaly Lungs:    Clear to auscultation bilaterally; normal respiratory effort CV:         Regular rate and rhythm; no murmurs Abd:      +  bowel sounds; soft, non-tender; no palpable masses, no distension Ext:    No edema; adequate peripheral perfusion Skin:      Warm and dry; no rash Neuro: alert and oriented x 3 Psych: normal mood and affect  Data Reviewed: Imaging CT chest 10/17/2008- centrilobular emphysema in the upper lobes, 2 mm nodule in the right lung.  Chest x-ray 08/25/2017- no acute cardiopulmonary abnormality I have reviewed the images personally  PFTs  12/21/2017 FVC 1.75 [9%), FEV1 1.10 [73%], F/F 63, TLC 94%, DLCO 41% Moderate obstruction, severe diffusion defect.  6-minute walk test 02/04/2018 96 m, Patient had  to stop for desats.  Titrated to 3 L oxygen  Labs CBC 02/04/2018-WBC 7.4, eos 2.5%, absolute eosinophil count 185 Alpha-1 antitrypsin 12/05/2017-159, PI MM IgE 12/05/2017-224  Assessment:  COPD CT scan from 2010 reviewed which shows emphysematous changes in the upper lobes.   PFTs reviewed which show moderate obstruction with no significant bronchodilator response.  Currently on Stiolto with stable symptoms. I do not believe she will need inhaled corticosteroid as suspicion for asthma is low, peripheral eosinophils are low.  Continue supplemental oxygen with exercise and at night.  Health maintenance 12/10/2018-influenza 12/03/2013-Prevnar 12/08/2018-Pneumovax  Plan/Recommendations: - Stiolto - Continue supplemental oxygen  Marshell Garfinkel MD [facility] 04/09/2019, 2:14 PM  CC: Biagio Borg, MD

## 2019-04-09 NOTE — Patient Instructions (Signed)
Glad you are doing well with regard to your breathing Continue the Stiolto inhaler and supplemental oxygen He can check with your insurance company if there is any cheaper alternatives Follow-up in clinic in 1 year

## 2019-04-17 ENCOUNTER — Other Ambulatory Visit: Payer: Self-pay | Admitting: Internal Medicine

## 2019-04-17 MED ORDER — ALPRAZOLAM 0.25 MG PO TABS: 0.2500 mg | ORAL_TABLET | Freq: Two times a day (BID) | ORAL | 2 refills | Status: DC | PRN

## 2019-04-17 NOTE — Telephone Encounter (Signed)
Done erx 

## 2019-05-02 DIAGNOSIS — M6281 Muscle weakness (generalized): Secondary | ICD-10-CM | POA: Diagnosis not present

## 2019-05-02 DIAGNOSIS — Z471 Aftercare following joint replacement surgery: Secondary | ICD-10-CM | POA: Diagnosis not present

## 2019-05-02 DIAGNOSIS — Z96642 Presence of left artificial hip joint: Secondary | ICD-10-CM | POA: Diagnosis not present

## 2019-05-02 DIAGNOSIS — R269 Unspecified abnormalities of gait and mobility: Secondary | ICD-10-CM | POA: Diagnosis not present

## 2019-05-05 ENCOUNTER — Other Ambulatory Visit: Payer: Self-pay | Admitting: Internal Medicine

## 2019-05-05 DIAGNOSIS — M159 Polyosteoarthritis, unspecified: Secondary | ICD-10-CM

## 2019-05-05 NOTE — Telephone Encounter (Signed)
Done erx 

## 2019-05-12 ENCOUNTER — Telehealth: Payer: Self-pay | Admitting: Pulmonary Disease

## 2019-05-12 MED ORDER — STIOLTO RESPIMAT 2.5-2.5 MCG/ACT IN AERS: 2.0000 | INHALATION_SPRAY | Freq: Every day | RESPIRATORY_TRACT | 0 refills | Status: DC

## 2019-05-12 NOTE — Telephone Encounter (Signed)
stiolto sample placed up front for pickup. Pt aware. Nothing further needed at this time- will close encounter.

## 2019-06-02 DIAGNOSIS — R269 Unspecified abnormalities of gait and mobility: Secondary | ICD-10-CM | POA: Diagnosis not present

## 2019-06-02 DIAGNOSIS — Z96642 Presence of left artificial hip joint: Secondary | ICD-10-CM | POA: Diagnosis not present

## 2019-06-02 DIAGNOSIS — M6281 Muscle weakness (generalized): Secondary | ICD-10-CM | POA: Diagnosis not present

## 2019-06-02 DIAGNOSIS — Z471 Aftercare following joint replacement surgery: Secondary | ICD-10-CM | POA: Diagnosis not present

## 2019-06-22 ENCOUNTER — Other Ambulatory Visit: Payer: Self-pay | Admitting: Pulmonary Disease

## 2019-06-25 DIAGNOSIS — H16223 Keratoconjunctivitis sicca, not specified as Sjogren's, bilateral: Secondary | ICD-10-CM | POA: Diagnosis not present

## 2019-06-30 DIAGNOSIS — Z471 Aftercare following joint replacement surgery: Secondary | ICD-10-CM | POA: Diagnosis not present

## 2019-06-30 DIAGNOSIS — Z96642 Presence of left artificial hip joint: Secondary | ICD-10-CM | POA: Diagnosis not present

## 2019-06-30 DIAGNOSIS — R269 Unspecified abnormalities of gait and mobility: Secondary | ICD-10-CM | POA: Diagnosis not present

## 2019-06-30 DIAGNOSIS — M6281 Muscle weakness (generalized): Secondary | ICD-10-CM | POA: Diagnosis not present

## 2019-07-16 ENCOUNTER — Other Ambulatory Visit: Payer: Self-pay | Admitting: Internal Medicine

## 2019-07-22 ENCOUNTER — Other Ambulatory Visit: Payer: Self-pay | Admitting: Internal Medicine

## 2019-07-31 DIAGNOSIS — Z96642 Presence of left artificial hip joint: Secondary | ICD-10-CM | POA: Diagnosis not present

## 2019-07-31 DIAGNOSIS — Z471 Aftercare following joint replacement surgery: Secondary | ICD-10-CM | POA: Diagnosis not present

## 2019-07-31 DIAGNOSIS — R269 Unspecified abnormalities of gait and mobility: Secondary | ICD-10-CM | POA: Diagnosis not present

## 2019-07-31 DIAGNOSIS — M6281 Muscle weakness (generalized): Secondary | ICD-10-CM | POA: Diagnosis not present

## 2019-08-07 ENCOUNTER — Other Ambulatory Visit: Payer: Self-pay | Admitting: Internal Medicine

## 2019-08-07 NOTE — Telephone Encounter (Signed)
Please refill as per office routine med refill policy (all routine meds refilled for 3 mo or monthly per pt preference up to one year from last visit, then month to month grace period for 3 mo, then further med refills will have to be denied)  

## 2019-08-17 ENCOUNTER — Other Ambulatory Visit: Payer: Self-pay | Admitting: Internal Medicine

## 2019-08-27 ENCOUNTER — Other Ambulatory Visit: Payer: Self-pay | Admitting: Internal Medicine

## 2019-08-30 DIAGNOSIS — M6281 Muscle weakness (generalized): Secondary | ICD-10-CM | POA: Diagnosis not present

## 2019-08-30 DIAGNOSIS — Z471 Aftercare following joint replacement surgery: Secondary | ICD-10-CM | POA: Diagnosis not present

## 2019-08-30 DIAGNOSIS — R269 Unspecified abnormalities of gait and mobility: Secondary | ICD-10-CM | POA: Diagnosis not present

## 2019-08-30 DIAGNOSIS — Z96642 Presence of left artificial hip joint: Secondary | ICD-10-CM | POA: Diagnosis not present

## 2019-09-03 ENCOUNTER — Other Ambulatory Visit: Payer: Self-pay

## 2019-09-03 ENCOUNTER — Ambulatory Visit: Payer: Medicare Other | Admitting: Pulmonary Disease

## 2019-09-03 ENCOUNTER — Encounter: Payer: Self-pay | Admitting: Pulmonary Disease

## 2019-09-03 VITALS — BP 124/68 | HR 72 | Temp 98.6°F | Ht 64.0 in | Wt 202.6 lb

## 2019-09-03 DIAGNOSIS — J449 Chronic obstructive pulmonary disease, unspecified: Secondary | ICD-10-CM

## 2019-09-03 NOTE — Progress Notes (Signed)
Wendy Torres    [id number]    [birth year]  Primary Care Physician:John, Hunt Oris, MD  Referring Physician: Biagio Borg, MD [address] [place],  [place] [address]  Chief complaint: Follow-up for COPD  HPI: 79 year old with history of emphysema, hypertension, diastolic heart failure.  Referred here for evaluation of emphysema Complaints of dyspnea with activity, no symptoms at rest.  Denies any cough, sputum production, wheezing She was hospitalized in April 2018 for COPD exacerbation, CHF exacerbation and has been on supplemental oxygen since then Symbicort changed to [place] in 2019 and she is doing well with this change  Pets: No pets  Occupation: Homemaker Exposures: No known exposures.  Reports a mildew problem in the basement but she hardly goes there Smoking history: 25-pack-year smoker.  Quit in 1974 Travel history: No significant travel Relevant family history: Father had emphysema  Interim history: No new complaints today.  Has chronic dyspnea on exertion Continues on Stiolto.  Outpatient Encounter Medications as of 09/03/2019  Medication Sig  . acetaminophen (TYLENOL) 325 MG tablet Take 2 tablets (650 mg total) by mouth every 6 (six) hours as needed for mild pain (or Fever >/= 101).  Marland Kitchen ALPRAZolam (XANAX) 0.25 MG tablet Take 1 tablet (0.25 mg total) by mouth 2 (two) times daily as needed.  Marland Kitchen amLODipine-benazepril (LOTREL) 10-40 MG capsule TAKE 1 CAPSULE BY MOUTH  DAILY  . cetirizine (ZYRTEC) 10 MG tablet TAKE 1 TABLET BY MOUTH EVERY DAY  . cloNIDine (CATAPRES) 0.3 MG tablet TAKE 1 TABLET BY MOUTH TWO  TIMES DAILY  . furosemide (LASIX) 20 MG tablet Take 1 tablet (20 mg total) by mouth every morning.  . gabapentin (NEURONTIN) 300 MG capsule TAKE 1 CAPSULE BY MOUTH 3  TIMES DAILY  . hydrALAZINE (APRESOLINE) 25 MG tablet Take 1 tablet (25 mg total) by mouth 3 (three) times daily.  Marland Kitchen lovastatin (MEVACOR) 40 MG tablet TAKE 1 TABLET BY MOUTH AT  BEDTIME  .  oxybutynin (DITROPAN) 5 MG tablet TAKE 1 TABLET BY MOUTH 3  TIMES DAILY  . pantoprazole (PROTONIX) 40 MG tablet TAKE 1 TABLET BY MOUTH  DAILY  . PROAIR HFA 108 (90 Base) MCG/ACT inhaler TAKE 2 PUFFS BY MOUTH EVERY 6 HOURS AS NEEDED FOR WHEEZE OR SHORTNESS OF BREATH  . STIOLTO RESPIMAT 2.5-2.5 MCG/ACT AERS INHALE 2 PUFFS BY MOUTH INTO THE LUNGS DAILY  . Tiotropium Bromide-Olodaterol (STIOLTO RESPIMAT) 2.5-2.5 MCG/ACT AERS Inhale 2 puffs into the lungs daily.  . Tiotropium Bromide-Olodaterol (STIOLTO RESPIMAT) 2.5-2.5 MCG/ACT AERS Inhale 2 puffs into the lungs daily.  . Tiotropium Bromide-Olodaterol (STIOLTO RESPIMAT) 2.5-2.5 MCG/ACT AERS Inhale 2 puffs into the lungs daily.  Marland Kitchen tiZANidine (ZANAFLEX) 4 MG tablet TAKE 1 TABLET (4 MG TOTAL) BY MOUTH EVERY 6 (SIX) HOURS AS NEEDED FOR MUSCLE SPASMS.  Marland Kitchen traMADol (ULTRAM) 50 MG tablet TAKE 1-2 TABLETS (50-100 MG TOTAL) BY MOUTH EVERY 6 (SIX) HOURS AS NEEDED. FOR PAIN  . traZODone (DESYREL) 50 MG tablet TAKE 1/2 TO 1 TABLET BY MOUTH AT BEDTIME AS NEEDED FOR SLEEP  . [DISCONTINUED] lansoprazole (PREVACID) 30 MG capsule Take 1 capsule (30 mg total) by mouth daily.   No facility-administered encounter medications on file as of 09/03/2019.   Physical Exam: Blood pressure 124/68, pulse 72, temperature 98.6 F (37 C), temperature source Temporal, height 5\' 4"  (1.626 m), weight 202 lb 9.6 oz (91.9 kg), SpO2 98 %. Gen:      No acute distress HEENT:  EOMI, sclera anicteric Neck:     No masses; no thyromegaly Lungs:    Clear to auscultation bilaterally; normal respiratory effort CV:         Regular rate and rhythm; no murmurs Abd:      + bowel sounds; soft, non-tender; no palpable masses, no distension Ext:    No edema; adequate peripheral perfusion Skin:      Warm and dry; no rash Neuro: alert and oriented x 3 Psych: normal mood and affect  Data Reviewed: Imaging CT chest 10/17/2008- centrilobular emphysema in the upper lobes, 2 mm nodule in the right lung.   Chest x-ray 08/25/2017- no acute cardiopulmonary abnormality I have reviewed the images personally  PFTs  12/21/2017 FVC 1.75 [9%), FEV1 1.10 [73%], F/F 63, TLC 94%, DLCO 41% Moderate obstruction, severe diffusion defect.  6-minute walk test 02/04/2018 96 m, Patient had to stop for desats.  Titrated to 3 L oxygen  Labs CBC 02/04/2018-WBC 7.4, eos 2.5%, absolute eosinophil count 185 Alpha-1 antitrypsin 12/05/2017-159, PI MM IgE 12/05/2017-224  Assessment:  COPD CT scan from 2010 reviewed which shows emphysematous changes in the upper lobes.   PFTs reviewed which show moderate obstruction with no significant bronchodilator response.  Currently on Stiolto with stable symptoms. I do not believe she will need inhaled corticosteroid as suspicion for asthma is low, peripheral eosinophils are low.  Continue supplemental oxygen with exercise and at night. Encouraged her to get vaccinated against COVID-19.  Health maintenance 12/03/2013-Prevnar 12/08/2018-Pneumovax  Plan/Recommendations: - Stiolto - Continue supplemental oxygen  Marshell Garfinkel MD [facility] 09/03/2019, 2:13 PM  CC: Biagio Borg, MD

## 2019-09-03 NOTE — Patient Instructions (Signed)
I am glad you are doing well with regard to your breathing Continue the Stiolto inhaler Follow-up in 1 year. 

## 2019-09-15 ENCOUNTER — Other Ambulatory Visit: Payer: Self-pay | Admitting: Internal Medicine

## 2019-09-15 DIAGNOSIS — M159 Polyosteoarthritis, unspecified: Secondary | ICD-10-CM

## 2019-09-15 NOTE — Telephone Encounter (Signed)
Done x 1 mo  Please contact pt - due for rov for further refills

## 2019-09-16 ENCOUNTER — Other Ambulatory Visit: Payer: Self-pay | Admitting: Internal Medicine

## 2019-09-17 NOTE — Telephone Encounter (Signed)
Please refill as per office routine med refill policy (all routine meds refilled for 3 mo or monthly per pt preference up to one year from last visit, then month to month grace period for 3 mo, then further med refills will have to be denied)  

## 2019-09-19 ENCOUNTER — Other Ambulatory Visit: Payer: Self-pay | Admitting: Internal Medicine

## 2019-09-19 NOTE — Telephone Encounter (Signed)
Please refill as per office routine med refill policy (all routine meds refilled for 3 mo or monthly per pt preference up to one year from last visit, then month to month grace period for 3 mo, then further med refills will have to be denied)  

## 2019-09-30 DIAGNOSIS — Z471 Aftercare following joint replacement surgery: Secondary | ICD-10-CM | POA: Diagnosis not present

## 2019-09-30 DIAGNOSIS — M6281 Muscle weakness (generalized): Secondary | ICD-10-CM | POA: Diagnosis not present

## 2019-09-30 DIAGNOSIS — Z96642 Presence of left artificial hip joint: Secondary | ICD-10-CM | POA: Diagnosis not present

## 2019-09-30 DIAGNOSIS — R269 Unspecified abnormalities of gait and mobility: Secondary | ICD-10-CM | POA: Diagnosis not present

## 2019-10-01 ENCOUNTER — Other Ambulatory Visit: Payer: Self-pay | Admitting: Internal Medicine

## 2019-10-01 DIAGNOSIS — H16223 Keratoconjunctivitis sicca, not specified as Sjogren's, bilateral: Secondary | ICD-10-CM | POA: Diagnosis not present

## 2019-10-02 ENCOUNTER — Other Ambulatory Visit: Payer: Self-pay | Admitting: Internal Medicine

## 2019-10-03 NOTE — Telephone Encounter (Signed)
Pt would need to come in for annual visit with PCP for further rx refills

## 2019-10-13 ENCOUNTER — Ambulatory Visit: Payer: Medicare Other | Admitting: Internal Medicine

## 2019-10-15 ENCOUNTER — Other Ambulatory Visit: Payer: Self-pay

## 2019-10-15 ENCOUNTER — Encounter: Payer: Self-pay | Admitting: Internal Medicine

## 2019-10-15 ENCOUNTER — Ambulatory Visit (INDEPENDENT_AMBULATORY_CARE_PROVIDER_SITE_OTHER): Payer: Medicare Other | Admitting: Internal Medicine

## 2019-10-15 VITALS — BP 128/82 | HR 83 | Temp 98.8°F | Ht 64.0 in

## 2019-10-15 DIAGNOSIS — M159 Polyosteoarthritis, unspecified: Secondary | ICD-10-CM

## 2019-10-15 DIAGNOSIS — I10 Essential (primary) hypertension: Secondary | ICD-10-CM | POA: Diagnosis not present

## 2019-10-15 DIAGNOSIS — M25561 Pain in right knee: Secondary | ICD-10-CM | POA: Diagnosis not present

## 2019-10-15 DIAGNOSIS — Z0001 Encounter for general adult medical examination with abnormal findings: Secondary | ICD-10-CM | POA: Diagnosis not present

## 2019-10-15 DIAGNOSIS — M8949 Other hypertrophic osteoarthropathy, multiple sites: Secondary | ICD-10-CM

## 2019-10-15 DIAGNOSIS — M25461 Effusion, right knee: Secondary | ICD-10-CM

## 2019-10-15 DIAGNOSIS — R7303 Prediabetes: Secondary | ICD-10-CM | POA: Diagnosis not present

## 2019-10-15 LAB — BASIC METABOLIC PANEL
BUN: 11 mg/dL (ref 6–23)
CO2: 30 mEq/L (ref 19–32)
Calcium: 10.1 mg/dL (ref 8.4–10.5)
Chloride: 97 mEq/L (ref 96–112)
Creatinine, Ser: 0.98 mg/dL (ref 0.40–1.20)
GFR: 65.95 mL/min (ref >60.00)
Glucose, Bld: 98 mg/dL (ref 70–99)
Potassium: 3.8 mEq/L (ref 3.5–5.1)
Sodium: 131 mEq/L — ABNORMAL LOW (ref 135–145)

## 2019-10-15 LAB — TSH: TSH: 1.2 u[IU]/mL (ref 0.35–4.50)

## 2019-10-15 LAB — CBC WITH DIFFERENTIAL/PLATELET
Basophils Absolute: 0.1 10*3/uL (ref 0.0–0.1)
Basophils Relative: 0.6 % (ref 0.0–3.0)
Eosinophils Absolute: 0.1 10*3/uL (ref 0.0–0.7)
Eosinophils Relative: 0.7 % (ref 0.0–5.0)
HCT: 34.9 % — ABNORMAL LOW (ref 36.0–46.0)
Hemoglobin: 11.4 g/dL — ABNORMAL LOW (ref 12.0–15.0)
Lymphocytes Relative: 24.3 % (ref 12.0–46.0)
Lymphs Abs: 2.2 10*3/uL (ref 0.7–4.0)
MCHC: 32.5 g/dL (ref 30.0–36.0)
MCV: 82.8 fl (ref 78.0–100.0)
Monocytes Absolute: 1.6 10*3/uL — ABNORMAL HIGH (ref 0.1–1.0)
Monocytes Relative: 16.8 % — ABNORMAL HIGH (ref 3.0–12.0)
Neutro Abs: 5.3 10*3/uL (ref 1.4–7.7)
Neutrophils Relative %: 57.6 % (ref 43.0–77.0)
Platelets: 303 10*3/uL (ref 150.0–400.0)
RBC: 4.22 Mil/uL (ref 3.87–5.11)
RDW: 15.5 % (ref 11.5–15.5)
WBC: 9.2 10*3/uL (ref 4.0–10.5)

## 2019-10-15 LAB — LIPID PANEL
Cholesterol: 134 mg/dL (ref 0–200)
HDL: 55.5 mg/dL (ref >39.00)
LDL Cholesterol: 63 mg/dL (ref 0–99)
NonHDL: 78.5
Total CHOL/HDL Ratio: 2
Triglycerides: 78 mg/dL (ref 0.0–149.0)
VLDL: 15.6 mg/dL (ref 0.0–40.0)

## 2019-10-15 LAB — HEPATIC FUNCTION PANEL
ALT: 8 U/L (ref 0–35)
AST: 14 U/L (ref 0–37)
Albumin: 3.9 g/dL (ref 3.5–5.2)
Alkaline Phosphatase: 73 U/L (ref 39–117)
Bilirubin, Direct: 0.1 mg/dL (ref 0.0–0.3)
Total Bilirubin: 0.4 mg/dL (ref 0.2–1.2)
Total Protein: 8.3 g/dL (ref 6.0–8.3)

## 2019-10-15 MED ORDER — HYDROCODONE-ACETAMINOPHEN 5-325 MG PO TABS: 1.0000 | ORAL_TABLET | Freq: Four times a day (QID) | ORAL | 0 refills | Status: DC | PRN

## 2019-10-15 MED ORDER — ALPRAZOLAM 0.25 MG PO TABS: 0.2500 mg | ORAL_TABLET | Freq: Two times a day (BID) | ORAL | 2 refills | Status: DC | PRN

## 2019-10-15 NOTE — Assessment & Plan Note (Signed)
O/w change to tramadol for more chronic pain

## 2019-10-15 NOTE — Assessment & Plan Note (Addendum)
Severe today, for hydrocodone prn, f/u ortho as planned  I spent 31 minutes in addition to time for CPX wellness examination in preparing to see the patient by review of recent labs, imaging and procedures, obtaining and reviewing separately obtained history, communicating with the patient and family or caregiver, ordering medications, tests or procedures, and documenting clinical information in the EHR including the differential Dx, treatment, and any further evaluation and other management of right knee pain, preDM, OA, HTN

## 2019-10-15 NOTE — Progress Notes (Signed)
Subjective:    Patient ID: Wendy Torres, female    DOB: Oct 30, 1940, 79 y.o.   MRN: [id number]  HPI  Here for wellness and f/u;  Overall doing ok;  Pt denies Chest pain, worsening SOB, DOE, wheezing, orthopnea, PND, worsening LE edema, palpitations, dizziness or syncope.  Pt denies neurological change such as new headache, facial or extremity weakness.  Pt denies polydipsia, polyuria, or low sugar symptoms. Pt states overall good compliance with treatment and medications, good tolerability, and has been trying to follow appropriate diet.  Pt denies worsening depressive symptoms, suicidal ideation or panic but has ongoing panic getting worse recently. No fever, night sweats, wt loss, loss of appetite, or other constitutional symptoms.  Pt states good ability with ADL's, has low fall risk, home safety reviewed and adequate, no other significant changes in hearing or vision, and only occasionally active with exercise. Grieving after husband died 2019-09-02 after 85 yrs.   Also c/o 1 mo worsening right knee pain and swelling 9/10, constant, can hardly walk today, has ortho appt in 3 days trying to get by until then, no falls. Tramadol not working Past Medical History:  Diagnosis Date  . ABNORMAL CHEST XRAY 11/05/2008  . ACUTE URIS OF UNSPECIFIED SITE 10/22/2007  . ALLERGIC RHINITIS 12/09/2006  . ANEMIA 01/14/2009  . Anxiety   . Arthritis   . ASTHMA 12/09/2006  . Blood transfusion without reported diagnosis   . Cataract    bilateral -removed  . CHF (congestive heart failure) ([place])   . Chronic pain syndrome 02/16/2009  . COPD 12/09/2006  . DEGENERATIVE JOINT DISEASE 12/09/2006  . DIZZINESS 03/30/2010  . DYSPNEA 01/27/2010   with heavy exertion  . FREQUENCY, URINARY 03/30/2010  . GERD 12/09/2006  . Glossitis 01/14/2009  . HELICOBACTER PYLORI GASTRITIS, HX OF 12/09/2006  . HIATAL HERNIA 03/01/2010  . History of hiatal hernia   . HYPERLIPIDEMIA 04/18/2007  . HYPERSOMNIA 08/31/2008  . HYPERTENSION  12/09/2006  . INSOMNIA-SLEEP DISORDER-UNSPEC 11/05/2008  . LOW BACK PAIN 12/09/2006  . Morbid obesity ([place]) 12/09/2006  . Nonspecific (abnormal) findings on radiological and other examination of body structure 11/05/2008  . OSTEOPOROSIS 12/09/2006  . OVERACTIVE BLADDER 04/14/2008  . PALPITATIONS, RECURRENT 01/27/2010  . Stroke [facility])    pt. stated light one years ago  . SYNCOPE 11/05/2008  . TRANSIENT ISCHEMIC ATTACK, HX OF 12/09/2006   Past Surgical History:  Procedure Laterality Date  . COLONOSCOPY    . LUMBAR DISC SURGERY    . TONSILLECTOMY     as a child  . TOTAL HIP ARTHROPLASTY Right 11/06/2012   Procedure: RIGHT TOTAL HIP ARTHROPLASTY WITH ACETABULAR AUTOGRAFT;  Surgeon: Gearlean Alf, MD;  Location: [facility];  Service: Orthopedics;  Laterality: Right;  . TOTAL HIP ARTHROPLASTY Left 02/18/2014   Procedure: LEFT TOTAL HIP ARTHROPLASTY;  Surgeon: Gearlean Alf, MD;  Location: [facility];  Service: Orthopedics;  Laterality: Left;  . TUBAL LIGATION      reports that she quit smoking about 46 years ago. Her smoking use included cigarettes. She has a 12.50 pack-year smoking history. She has never used smokeless tobacco. She reports that she does not drink alcohol and does not use drugs. family history includes Heart disease in her mother; Hypertension in her brother; Lung cancer in her father. No Known Allergies Current Outpatient Medications on File Prior to Visit  Medication Sig Dispense Refill  . acetaminophen (TYLENOL) 325 MG tablet Take 2 tablets (650 mg total) by mouth every 6 (  six) hours as needed for mild pain (or Fever >/= 101). 40 tablet 0  . amLODipine-benazepril (LOTREL) 10-40 MG capsule TAKE 1 CAPSULE BY MOUTH  DAILY 90 capsule 3  . cetirizine (ZYRTEC) 10 MG tablet TAKE 1 TABLET BY MOUTH EVERY DAY 90 tablet 1  . cloNIDine (CATAPRES) 0.3 MG tablet TAKE 1 TABLET BY MOUTH  TWICE DAILY 180 tablet 3  . furosemide (LASIX) 20 MG tablet Take 1 tablet (20 mg total) by mouth every morning. 90  tablet 3  . gabapentin (NEURONTIN) 300 MG capsule TAKE 1 CAPSULE BY MOUTH 3  TIMES DAILY 270 capsule 3  . hydrALAZINE (APRESOLINE) 25 MG tablet TAKE 1 TABLET BY MOUTH 3  TIMES DAILY 270 tablet 0  . lovastatin (MEVACOR) 40 MG tablet TAKE 1 TABLET BY MOUTH AT  BEDTIME 90 tablet 3  . oxybutynin (DITROPAN) 5 MG tablet TAKE 1 TABLET BY MOUTH 3  TIMES DAILY 270 tablet 3  . pantoprazole (PROTONIX) 40 MG tablet TAKE 1 TABLET BY MOUTH  DAILY 90 tablet 3  . PROAIR HFA 108 (90 Base) MCG/ACT inhaler TAKE 2 PUFFS BY MOUTH EVERY 6 HOURS AS NEEDED FOR WHEEZE OR SHORTNESS OF BREATH 18 g 2  . STIOLTO RESPIMAT 2.5-2.5 MCG/ACT AERS INHALE 2 PUFFS BY MOUTH INTO THE LUNGS DAILY 4 g 6  . Tiotropium Bromide-Olodaterol (STIOLTO RESPIMAT) 2.5-2.5 MCG/ACT AERS Inhale 2 puffs into the lungs daily. 8 g 0  . Tiotropium Bromide-Olodaterol (STIOLTO RESPIMAT) 2.5-2.5 MCG/ACT AERS Inhale 2 puffs into the lungs daily. 4 g 0  . Tiotropium Bromide-Olodaterol (STIOLTO RESPIMAT) 2.5-2.5 MCG/ACT AERS Inhale 2 puffs into the lungs daily. 1 g 0  . tiZANidine (ZANAFLEX) 4 MG tablet TAKE 1 TABLET (4 MG TOTAL) BY MOUTH EVERY 6 (SIX) HOURS AS NEEDED FOR MUSCLE SPASMS. 50 tablet 1  . traMADol (ULTRAM) 50 MG tablet TAKE 1-2 TABLETS (50-100 MG TOTAL) BY MOUTH EVERY 6 (SIX) HOURS AS NEEDED. FOR PAIN 120 tablet 0  . traZODone (DESYREL) 50 MG tablet TAKE 1/2 TO 1 TABLET BY MOUTH AT BEDTIME AS NEEDED FOR SLEEP 90 tablet 1  . [DISCONTINUED] lansoprazole (PREVACID) 30 MG capsule Take 1 capsule (30 mg total) by mouth daily. 90 capsule 3   No current facility-administered medications on file prior to visit.   Review of Systems All otherwise neg per pt    Objective:   Physical Exam BP 128/82   Pulse 83   Temp 98.8 F (37.1 C) (Oral)   Ht 5\' 4"  (1.626 m)   SpO2 93%   BMI 34.78 kg/m  VS noted,  Constitutional: Pt appears in NAD HENT: Head: NCAT.  Right Ear: External ear normal.  Left Ear: External ear normal.  Eyes: . Pupils are equal,  round, and reactive to light. Conjunctivae and EOM are normal Nose: without d/c or deformity Neck: Neck supple. Gross normal ROM Cardiovascular: Normal rate and regular rhythm.   Pulmonary/Chest: Effort normal and breath sounds without rales or wheezing.  Abd:  Soft, NT, ND, + BS, no organomegaly Neurological: Pt is alert. At baseline orientation, motor grossly intact Right knee with marked bony degnerative changes and 2+ warm effusion Skin: Skin is warm. No rashes, other new lesions, no LE edema Psychiatric: Pt behavior is normal without agitation  All otherwise neg per pt Lab Results  Component Value Date   WBC 7.6 02/06/2019   HGB 11.9 (L) 02/06/2019   HCT 36.4 02/06/2019   PLT 309.0 02/06/2019   GLUCOSE 98 02/06/2019   CHOL 157  02/06/2019   TRIG 97.0 02/06/2019   HDL 54.50 02/06/2019   LDLCALC 83 02/06/2019   ALT 9 02/06/2019   AST 14 02/06/2019   NA 136 02/06/2019   K 4.2 02/06/2019   CL 100 02/06/2019   CREATININE 0.97 02/06/2019   BUN 10 02/06/2019   CO2 27 02/06/2019   TSH 0.89 02/06/2019   INR 1.02 02/10/2014   HGBA1C 6.3 02/06/2019      Assessment & Plan:

## 2019-10-15 NOTE — Assessment & Plan Note (Signed)
stable overall by history and exam, recent data reviewed with pt, and pt to continue medical treatment as before,  to f/u any worsening symptoms or concerns  

## 2019-10-15 NOTE — Assessment & Plan Note (Signed)

## 2019-10-15 NOTE — Patient Instructions (Signed)
Please take all new medication as prescribed  - the pain medication  Please continue all other medications as before, and refills have been done if requested - the xanax  Please have the pharmacy call with any other refills you may need.  Please continue your efforts at being more active, low cholesterol diet, and weight control.  You are otherwise up to date with prevention measures today.  Please keep your appointments with your specialists as you may have planned  Please go to the LAB at the blood drawing area for the tests to be done  You will be contacted by phone if any changes need to be made immediately.  Otherwise, you will receive a letter about your results with an explanation, but please check with MyChart first.  Please remember to sign up for MyChart if you have not done so, as this will be important to you in the future with finding out test results, communicating by private email, and scheduling acute appointments online when needed.  Please make an Appointment to return for your 1 year visit, or sooner if needed

## 2019-10-16 ENCOUNTER — Encounter: Payer: Self-pay | Admitting: Internal Medicine

## 2019-10-16 LAB — HEMOGLOBIN A1C: Hgb A1c MFr Bld: 6.1 % (ref 4.6–6.5)

## 2019-10-17 ENCOUNTER — Telehealth: Payer: Self-pay | Admitting: Internal Medicine

## 2019-10-17 LAB — URINALYSIS, ROUTINE W REFLEX MICROSCOPIC
Bilirubin Urine: NEGATIVE
Hgb urine dipstick: NEGATIVE
Ketones, ur: NEGATIVE
Leukocytes,Ua: NEGATIVE
Nitrite: NEGATIVE
RBC / HPF: NONE SEEN (ref 0–?)
Specific Gravity, Urine: <=1.005 — AB (ref 1.000–1.030)
Total Protein, Urine: NEGATIVE
Urine Glucose: NEGATIVE
Urobilinogen, UA: 0.2 (ref 0.0–1.0)
WBC, UA: NONE SEEN (ref 0–?)
pH: 6.5 (ref 5.0–8.0)

## 2019-10-17 NOTE — Addendum Note (Signed)
Addended by: Trenda Moots on: 1/94/7125 04:48 PM   Modules accepted: Orders

## 2019-10-17 NOTE — Telephone Encounter (Signed)
New message:   Pt's daughter is calling and states the pt needs and prescription for a walker. She also states it needs to be one with a seat so she can sit down. Please advise.

## 2019-10-17 NOTE — Telephone Encounter (Signed)
Sent to Dr. John to advise. 

## 2019-10-18 DIAGNOSIS — M25561 Pain in right knee: Secondary | ICD-10-CM | POA: Diagnosis not present

## 2019-10-20 ENCOUNTER — Other Ambulatory Visit: Payer: Self-pay | Admitting: Internal Medicine

## 2019-10-20 DIAGNOSIS — R269 Unspecified abnormalities of gait and mobility: Secondary | ICD-10-CM

## 2019-10-20 NOTE — Telephone Encounter (Signed)
Done hardcopy to Wendy Torres 

## 2019-10-20 NOTE — Telephone Encounter (Signed)
Spoke with pt and informed her that her hardcopy rx for her walker is ready for pick up.  *Pt has given me her daughter [name] number and I was able to LVM letting her know that Ms. Seguin rx for her walker was ready for pick up.

## 2019-10-23 DIAGNOSIS — Z96642 Presence of left artificial hip joint: Secondary | ICD-10-CM | POA: Diagnosis not present

## 2019-10-23 DIAGNOSIS — R269 Unspecified abnormalities of gait and mobility: Secondary | ICD-10-CM | POA: Diagnosis not present

## 2019-10-23 DIAGNOSIS — M6281 Muscle weakness (generalized): Secondary | ICD-10-CM | POA: Diagnosis not present

## 2019-10-23 DIAGNOSIS — Z471 Aftercare following joint replacement surgery: Secondary | ICD-10-CM | POA: Diagnosis not present

## 2019-10-30 DIAGNOSIS — Z96642 Presence of left artificial hip joint: Secondary | ICD-10-CM | POA: Diagnosis not present

## 2019-10-30 DIAGNOSIS — Z471 Aftercare following joint replacement surgery: Secondary | ICD-10-CM | POA: Diagnosis not present

## 2019-10-30 DIAGNOSIS — M6281 Muscle weakness (generalized): Secondary | ICD-10-CM | POA: Diagnosis not present

## 2019-10-30 DIAGNOSIS — R269 Unspecified abnormalities of gait and mobility: Secondary | ICD-10-CM | POA: Diagnosis not present

## 2019-11-10 ENCOUNTER — Other Ambulatory Visit: Payer: Self-pay | Admitting: Internal Medicine

## 2019-11-10 DIAGNOSIS — M159 Polyosteoarthritis, unspecified: Secondary | ICD-10-CM

## 2019-11-10 NOTE — Telephone Encounter (Signed)
Done erx 

## 2019-11-12 ENCOUNTER — Telehealth: Payer: Self-pay | Admitting: Pulmonary Disease

## 2019-11-12 NOTE — Telephone Encounter (Signed)
Patient daughter called, patient has gotten free samples three times. Advised that we are unable to provide free samples at this time. Patient will need to use her pharmacy for refills.

## 2019-11-23 ENCOUNTER — Other Ambulatory Visit: Payer: Self-pay | Admitting: Internal Medicine

## 2019-11-24 NOTE — Telephone Encounter (Signed)
Please refill as per office routine med refill policy (all routine meds refilled for 3 mo or monthly per pt preference up to one year from last visit, then month to month grace period for 3 mo, then further med refills will have to be denied)  

## 2019-11-27 ENCOUNTER — Other Ambulatory Visit: Payer: Self-pay | Admitting: Internal Medicine

## 2019-11-28 NOTE — Telephone Encounter (Signed)
Please refill as per office routine med refill policy (all routine meds refilled for 3 mo or monthly per pt preference up to one year from last visit, then month to month grace period for 3 mo, then further med refills will have to be denied)  

## 2019-11-30 DIAGNOSIS — M6281 Muscle weakness (generalized): Secondary | ICD-10-CM | POA: Diagnosis not present

## 2019-11-30 DIAGNOSIS — Z471 Aftercare following joint replacement surgery: Secondary | ICD-10-CM | POA: Diagnosis not present

## 2019-11-30 DIAGNOSIS — R269 Unspecified abnormalities of gait and mobility: Secondary | ICD-10-CM | POA: Diagnosis not present

## 2019-11-30 DIAGNOSIS — Z96642 Presence of left artificial hip joint: Secondary | ICD-10-CM | POA: Diagnosis not present

## 2019-12-10 ENCOUNTER — Ambulatory Visit (INDEPENDENT_AMBULATORY_CARE_PROVIDER_SITE_OTHER): Payer: Medicare Other | Admitting: Internal Medicine

## 2019-12-10 ENCOUNTER — Encounter: Payer: Self-pay | Admitting: Internal Medicine

## 2019-12-10 ENCOUNTER — Other Ambulatory Visit: Payer: Self-pay

## 2019-12-10 VITALS — BP 130/70 | HR 105 | Temp 99.4°F | Ht 64.0 in

## 2019-12-10 DIAGNOSIS — I5032 Chronic diastolic (congestive) heart failure: Secondary | ICD-10-CM

## 2019-12-10 DIAGNOSIS — B370 Candidal stomatitis: Secondary | ICD-10-CM | POA: Diagnosis not present

## 2019-12-10 DIAGNOSIS — R7303 Prediabetes: Secondary | ICD-10-CM | POA: Diagnosis not present

## 2019-12-10 DIAGNOSIS — H9319 Tinnitus, unspecified ear: Secondary | ICD-10-CM | POA: Diagnosis not present

## 2019-12-10 DIAGNOSIS — J449 Chronic obstructive pulmonary disease, unspecified: Secondary | ICD-10-CM

## 2019-12-10 MED ORDER — NYSTATIN 100000 UNIT/ML MT SUSP
500000.0000 [IU] | Freq: Four times a day (QID) | OROMUCOSAL | 5 refills | Status: DC
Start: 2019-12-10 — End: 2020-02-02

## 2019-12-10 NOTE — Progress Notes (Signed)
Subjective:    Patient ID: Wendy Torres, female    DOB: [birth year], 79 y.o.   MRN: [id number]  HPI  Here to f/u; overall doing ok,  Pt denies chest pain, increasing sob or doe, wheezing, orthopnea, PND, increased LE swelling, palpitations, dizziness or syncope.  Pt denies new neurological symptoms such as new headache, or facial or extremity weakness or numbness.  Pt denies polydipsia, polyuria, or low sugar episode.  Pt states overall good compliance with meds,  Does have several wks ongoing nasal allergy symptoms with clearish congestion, itch and sneezing, without fever, pain, ST, cough, swelling or wheezing.  Also has beeping sound to the right ear and wondering if anything can be done for that.  ALso has a sore red rash to the tongue for several days. Past Medical History:  Diagnosis Date  . ABNORMAL CHEST XRAY 11/05/2008  . ACUTE URIS OF UNSPECIFIED SITE 10/22/2007  . ALLERGIC RHINITIS 12/09/2006  . ANEMIA 01/14/2009  . Anxiety   . Arthritis   . ASTHMA 12/09/2006  . Blood transfusion without reported diagnosis   . Cataract    bilateral -removed  . CHF (congestive heart failure) ([place])   . Chronic pain syndrome 02/16/2009  . COPD 12/09/2006  . DEGENERATIVE JOINT DISEASE 12/09/2006  . DIZZINESS 03/30/2010  . DYSPNEA 01/27/2010   with heavy exertion  . FREQUENCY, URINARY 03/30/2010  . GERD 12/09/2006  . Glossitis 01/14/2009  . HELICOBACTER PYLORI GASTRITIS, HX OF 12/09/2006  . HIATAL HERNIA 03/01/2010  . History of hiatal hernia   . HYPERLIPIDEMIA 04/18/2007  . HYPERSOMNIA 08/31/2008  . HYPERTENSION 12/09/2006  . INSOMNIA-SLEEP DISORDER-UNSPEC 11/05/2008  . LOW BACK PAIN 12/09/2006  . Morbid obesity ([place]) 12/09/2006  . Nonspecific (abnormal) findings on radiological and other examination of body structure 11/05/2008  . OSTEOPOROSIS 12/09/2006  . OVERACTIVE BLADDER 04/14/2008  . PALPITATIONS, RECURRENT 01/27/2010  . Stroke [facility])    pt. stated light one years ago  . SYNCOPE 11/05/2008  .  TRANSIENT ISCHEMIC ATTACK, HX OF 12/09/2006   Past Surgical History:  Procedure Laterality Date  . COLONOSCOPY    . LUMBAR DISC SURGERY    . TONSILLECTOMY     as a child  . TOTAL HIP ARTHROPLASTY Right 11/06/2012   Procedure: RIGHT TOTAL HIP ARTHROPLASTY WITH ACETABULAR AUTOGRAFT;  Surgeon: Gearlean Alf, MD;  Location: [facility];  Service: Orthopedics;  Laterality: Right;  . TOTAL HIP ARTHROPLASTY Left 02/18/2014   Procedure: LEFT TOTAL HIP ARTHROPLASTY;  Surgeon: Gearlean Alf, MD;  Location: [facility];  Service: Orthopedics;  Laterality: Left;  . TUBAL LIGATION      reports that she quit smoking about 47 years ago. Her smoking use included cigarettes. She has a 12.50 pack-year smoking history. She has never used smokeless tobacco. She reports that she does not drink alcohol and does not use drugs. family history includes Heart disease in her mother; Hypertension in her brother; Lung cancer in her father. No Known Allergies Current Outpatient Medications on File Prior to Visit  Medication Sig Dispense Refill  . acetaminophen (TYLENOL) 325 MG tablet Take 2 tablets (650 mg total) by mouth every 6 (six) hours as needed for mild pain (or Fever >/= 101). 40 tablet 0  . ALPRAZolam (XANAX) 0.25 MG tablet Take 1 tablet (0.25 mg total) by mouth 2 (two) times daily as needed. 60 tablet 2  . amLODipine-benazepril (LOTREL) 10-40 MG capsule TAKE 1 CAPSULE BY MOUTH  DAILY 90 capsule 2  . cetirizine (ZYRTEC)  10 MG tablet TAKE 1 TABLET BY MOUTH EVERY DAY 90 tablet 3  . cloNIDine (CATAPRES) 0.3 MG tablet TAKE 1 TABLET BY MOUTH  TWICE DAILY 180 tablet 3  . furosemide (LASIX) 20 MG tablet Take 1 tablet (20 mg total) by mouth every morning. 90 tablet 3  . gabapentin (NEURONTIN) 300 MG capsule TAKE 1 CAPSULE BY MOUTH 3  TIMES DAILY 270 capsule 3  . hydrALAZINE (APRESOLINE) 25 MG tablet TAKE 1 TABLET BY MOUTH 3  TIMES DAILY 270 tablet 3  . HYDROcodone-acetaminophen (NORCO/VICODIN) 5-325 MG tablet Take 1 tablet by  mouth every 6 (six) hours as needed. 30 tablet 0  . lovastatin (MEVACOR) 40 MG tablet TAKE 1 TABLET BY MOUTH AT  BEDTIME 90 tablet 3  . oxybutynin (DITROPAN) 5 MG tablet TAKE 1 TABLET BY MOUTH 3  TIMES DAILY 270 tablet 3  . pantoprazole (PROTONIX) 40 MG tablet TAKE 1 TABLET BY MOUTH  DAILY 90 tablet 3  . PROAIR HFA 108 (90 Base) MCG/ACT inhaler TAKE 2 PUFFS BY MOUTH EVERY 6 HOURS AS NEEDED FOR WHEEZE OR SHORTNESS OF BREATH 18 g 2  . RESTASIS 0.05 % ophthalmic emulsion     . STIOLTO RESPIMAT 2.5-2.5 MCG/ACT AERS INHALE 2 PUFFS BY MOUTH INTO THE LUNGS DAILY 4 g 6  . Tiotropium Bromide-Olodaterol (STIOLTO RESPIMAT) 2.5-2.5 MCG/ACT AERS Inhale 2 puffs into the lungs daily. 8 g 0  . Tiotropium Bromide-Olodaterol (STIOLTO RESPIMAT) 2.5-2.5 MCG/ACT AERS Inhale 2 puffs into the lungs daily. 4 g 0  . Tiotropium Bromide-Olodaterol (STIOLTO RESPIMAT) 2.5-2.5 MCG/ACT AERS Inhale 2 puffs into the lungs daily. 1 g 0  . tiZANidine (ZANAFLEX) 4 MG tablet TAKE 1 TABLET (4 MG TOTAL) BY MOUTH EVERY 6 (SIX) HOURS AS NEEDED FOR MUSCLE SPASMS. 50 tablet 1  . traMADol (ULTRAM) 50 MG tablet TAKE 1-2 TABLETS (50-100 MG TOTAL) BY MOUTH EVERY 6 (SIX) HOURS AS NEEDED. FOR PAIN 120 tablet 2  . traZODone (DESYREL) 50 MG tablet TAKE 1/2 TO 1 TABLET BY MOUTH AT BEDTIME AS NEEDED FOR SLEEP 90 tablet 1  . [DISCONTINUED] lansoprazole (PREVACID) 30 MG capsule Take 1 capsule (30 mg total) by mouth daily. 90 capsule 3   No current facility-administered medications on file prior to visit.   Review of Systems All otherwise neg per pt    Objective:   Physical Exam BP 130/70 (BP Location: Left Arm, Patient Position: Sitting, Cuff Size: Large)   Pulse (!) 105   Temp 99.4 F (37.4 C) (Oral)   Ht 5\' 4"  (1.626 m)   SpO2 91%   BMI 34.78 kg/m  VS noted,  Constitutional: Pt appears in NAD HENT: Head: NCAT.  Right Ear: External ear normal.  Left Ear: External ear normal.  Eyes: . Pupils are equal, round, and reactive to light.  Conjunctivae and EOM are normal Nose: without d/c or deformity Neck: Neck supple. Gross normal ROM Cardiovascular: Normal rate and regular rhythm.   Pulmonary/Chest: Effort normal and breath sounds without rales or wheezing.  Neurological: Pt is alert. At baseline orientation, motor grossly intact Skin: Skin is warm. No rashes, other new lesions, no LE edema Psychiatric: Pt behavior is normal without agitation  All otherwise neg per pt Lab Results  Component Value Date   WBC 9.2 10/15/2019   HGB 11.4 (L) 10/15/2019   HCT 34.9 (L) 10/15/2019   PLT 303.0 10/15/2019   GLUCOSE 98 10/15/2019   CHOL 134 10/15/2019   TRIG 78.0 10/15/2019   HDL 55.50 10/15/2019  LDLCALC 63 10/15/2019   ALT 8 10/15/2019   AST 14 10/15/2019   NA 131 (L) 10/15/2019   K 3.8 10/15/2019   CL 97 10/15/2019   CREATININE 0.98 10/15/2019   BUN 11 10/15/2019   CO2 30 10/15/2019   TSH 1.20 10/15/2019   INR 1.02 02/10/2014   HGBA1C 6.1 10/15/2019         Assessment & Plan:

## 2019-12-10 NOTE — Assessment & Plan Note (Signed)
stable overall by history and exam, recent data reviewed with pt, and pt to continue medical treatment as before,  to f/u any worsening symptoms or concerns  

## 2019-12-10 NOTE — Assessment & Plan Note (Signed)
Mild, pt to focus on allergy tx with antihist, otc nasal steroid, mucinex  I spent 31 minutes in preparing to see the patient by review of recent labs, imaging and procedures, obtaining and reviewing separately obtained history, communicating with the patient and family or caregiver, ordering medications, tests or procedures, and documenting clinical information in the EHR including the differential Dx, treatment, and any further evaluation and other management of tinnitus, thrush, predm, copd, chf

## 2019-12-10 NOTE — Patient Instructions (Signed)
Please take all new medication as prescribed - the nystatin solution for the tongue  Please continue all other medications as before, and refills have been done if requested.  Please have the pharmacy call with any other refills you may need.  Please continue your efforts at being more active, low cholesterol diet, and weight control  Please keep your appointments with your specialists as you may have planned

## 2019-12-10 NOTE — Assessment & Plan Note (Signed)
Mild to mod, for antibx course,  to f/u any worsening symptoms or concerns 

## 2019-12-31 DIAGNOSIS — R269 Unspecified abnormalities of gait and mobility: Secondary | ICD-10-CM | POA: Diagnosis not present

## 2019-12-31 DIAGNOSIS — Z471 Aftercare following joint replacement surgery: Secondary | ICD-10-CM | POA: Diagnosis not present

## 2019-12-31 DIAGNOSIS — Z96642 Presence of left artificial hip joint: Secondary | ICD-10-CM | POA: Diagnosis not present

## 2019-12-31 DIAGNOSIS — M6281 Muscle weakness (generalized): Secondary | ICD-10-CM | POA: Diagnosis not present

## 2020-01-04 ENCOUNTER — Other Ambulatory Visit: Payer: Self-pay | Admitting: Pulmonary Disease

## 2020-01-27 ENCOUNTER — Encounter: Payer: Self-pay | Admitting: Allergy and Immunology

## 2020-01-27 ENCOUNTER — Other Ambulatory Visit: Payer: Self-pay

## 2020-01-27 ENCOUNTER — Ambulatory Visit: Payer: Medicare Other | Admitting: Allergy and Immunology

## 2020-01-27 VITALS — BP 124/82 | HR 83 | Temp 98.7°F | Resp 16 | Ht 64.0 in | Wt 203.0 lb

## 2020-01-27 DIAGNOSIS — K140 Glossitis: Secondary | ICD-10-CM | POA: Diagnosis not present

## 2020-01-27 DIAGNOSIS — M3500 Sicca syndrome, unspecified: Secondary | ICD-10-CM

## 2020-01-27 DIAGNOSIS — J449 Chronic obstructive pulmonary disease, unspecified: Secondary | ICD-10-CM | POA: Diagnosis not present

## 2020-01-27 DIAGNOSIS — J4489 Other specified chronic obstructive pulmonary disease: Secondary | ICD-10-CM

## 2020-01-27 MED ORDER — BUDESONIDE-FORMOTEROL FUMARATE 160-4.5 MCG/ACT IN AERO: 2.0000 | INHALATION_SPRAY | Freq: Two times a day (BID) | RESPIRATORY_TRACT | 5 refills | Status: DC

## 2020-01-27 NOTE — Patient Instructions (Addendum)
  1. Allergen avoidance measures?  2. Discontinue Stiolto.   3. Start Symbicort 160 - 2 inhalations 2 times per day with spacer  3. Discontinue all antihistamines (cetirizine)  4. Continue Biotene mouth rinse daily  5.  Ditropan???,  Hydralazine???,  Clonidine???  6.  Blood - B12, Folate, ANA w/R  7. Return to clinic in 4 weeks or earlier if problem  8. Plan for Fall flu vaccine and [employer]

## 2020-01-27 NOTE — Progress Notes (Signed)
[place] - [place] - [place] - [place] - [place]   Dear Dr. Jenny Reichmann,  Thank you for referring Wendy Torres to the [facility] on 01/27/2020.   Below is a summation of this patient's evaluation and recommendations.  Thank you for your referral. I will keep you informed about this patient's response to treatment.   If you have any questions please do not hesitate to contact me.   Sincerely,  Jiles Prows, MD Allergy / Immunology [place]   ______________________________________________________________________    NEW PATIENT NOTE  Referring Provider: Biagio Borg, MD Primary Provider: Biagio Borg, MD Date of office visit: 01/27/2020    Subjective:   Chief Complaint:  Wendy Torres (DOB: 1940/05/23) is a 79 y.o. female who presents to the clinic on 01/27/2020 with a chief complaint of Oral Swelling (tongue) .     HPI: Wendy Torres presents to this clinic in evaluation of oral cavity issues.  Her big complaint is the fact that she has a sore tongue and a sore mouth and her gums feel sore and she has extremely dry mouth.  Apparently this has been present for greater than a year.  She has been treated for thrush and uses Biotene mouthwash on a regular basis but still continues to have this problem.  She has a history of dry eye and dry mouth syndrome in general.  She takes an eyedrop from her doctor which sounds as though it Restasis.  It should be noted that she is using cetirizine, Ditropan, hydralazine, clonidine, on a long-term basis and was recently started on a triple inhaler with an anticholinergic agent around the same point in time in which she developed her mouth issue.  In addition, she had dentures installed about 2 years ago.  She has many other health issues including what appears to be very severe COPD with baseline hypoxemia requiring oxygen supplementation.  She has  tried to use a bubble reservoir for her oxygen to see if this helps her mouth issue but this does not appear to help.  She has some minimal nasal symptoms with some occasional nasal congestion and rhinorrhea but she can smell and taste without any problem and does not have a history of ugly nasal discharge or recurrent sinusitis.  Past Medical History:  Diagnosis Date  . ABNORMAL CHEST XRAY 11/05/2008  . ACUTE URIS OF UNSPECIFIED SITE 10/22/2007  . ALLERGIC RHINITIS 12/09/2006  . ANEMIA 01/14/2009  . Anxiety   . Arthritis   . ASTHMA 12/09/2006  . Blood transfusion without reported diagnosis   . Cataract    bilateral -removed  . CHF (congestive heart failure) ([place])   . Chronic pain syndrome 02/16/2009  . COPD 12/09/2006  . DEGENERATIVE JOINT DISEASE 12/09/2006  . DIZZINESS 03/30/2010  . DYSPNEA 01/27/2010   with heavy exertion  . FREQUENCY, URINARY 03/30/2010  . GERD 12/09/2006  . Glossitis 01/14/2009  . HELICOBACTER PYLORI GASTRITIS, HX OF 12/09/2006  . HIATAL HERNIA 03/01/2010  . History of hiatal hernia   . HYPERLIPIDEMIA 04/18/2007  . HYPERSOMNIA 08/31/2008  . HYPERTENSION 12/09/2006  . INSOMNIA-SLEEP DISORDER-UNSPEC 11/05/2008  . LOW BACK PAIN 12/09/2006  . Morbid obesity ([place]) 12/09/2006  . Nonspecific (abnormal) findings on radiological and other examination of body structure 11/05/2008  . OSTEOPOROSIS 12/09/2006  . OVERACTIVE BLADDER 04/14/2008  . PALPITATIONS, RECURRENT 01/27/2010  . Stroke [facility])    pt. stated light one  years ago  . SYNCOPE 11/05/2008  . TRANSIENT ISCHEMIC ATTACK, HX OF 12/09/2006    Past Surgical History:  Procedure Laterality Date  . COLONOSCOPY    . LUMBAR DISC SURGERY    . TONSILLECTOMY     as a child  . TOTAL HIP ARTHROPLASTY Right 11/06/2012   Procedure: RIGHT TOTAL HIP ARTHROPLASTY WITH ACETABULAR AUTOGRAFT;  Surgeon: Gearlean Alf, MD;  Location: [facility];  Service: Orthopedics;  Laterality: Right;  . TOTAL HIP ARTHROPLASTY Left 02/18/2014   Procedure: LEFT  TOTAL HIP ARTHROPLASTY;  Surgeon: Gearlean Alf, MD;  Location: [facility];  Service: Orthopedics;  Laterality: Left;  . TUBAL LIGATION      Allergies as of 01/27/2020   No Known Allergies     Medication List      acetaminophen 325 MG tablet Commonly known as: TYLENOL Take 2 tablets (650 mg total) by mouth every 6 (six) hours as needed for mild pain (or Fever >/= 101).   ALPRAZolam 0.25 MG tablet Commonly known as: XANAX Take 1 tablet (0.25 mg total) by mouth 2 (two) times daily as needed.   amLODipine-benazepril 10-40 MG capsule Commonly known as: LOTREL TAKE 1 CAPSULE BY MOUTH  DAILY   budesonide-formoterol 160-4.5 MCG/ACT inhaler Commonly known as: Symbicort Inhale 2 puffs into the lungs 2 (two) times daily. Started by: Jiles Prows, MD   cetirizine 10 MG tablet Commonly known as: ZYRTEC TAKE 1 TABLET BY MOUTH EVERY DAY   cloNIDine 0.3 MG tablet Commonly known as: CATAPRES TAKE 1 TABLET BY MOUTH  TWICE DAILY   furosemide 20 MG tablet Commonly known as: LASIX Take 1 tablet (20 mg total) by mouth every morning.   gabapentin 300 MG capsule Commonly known as: NEURONTIN TAKE 1 CAPSULE BY MOUTH 3  TIMES DAILY   hydrALAZINE 25 MG tablet Commonly known as: APRESOLINE TAKE 1 TABLET BY MOUTH 3  TIMES DAILY   HYDROcodone-acetaminophen 5-325 MG tablet Commonly known as: NORCO/VICODIN Take 1 tablet by mouth every 6 (six) hours as needed.   lovastatin 40 MG tablet Commonly known as: MEVACOR TAKE 1 TABLET BY MOUTH AT  BEDTIME   oxybutynin 5 MG tablet Commonly known as: DITROPAN TAKE 1 TABLET BY MOUTH 3  TIMES DAILY   pantoprazole 40 MG tablet Commonly known as: PROTONIX TAKE 1 TABLET BY MOUTH  DAILY   ProAir HFA 108 (90 Base) MCG/ACT inhaler Generic drug: albuterol TAKE 2 PUFFS BY MOUTH EVERY 6 HOURS AS NEEDED FOR WHEEZE OR SHORTNESS OF BREATH   Restasis 0.05 % ophthalmic emulsion Generic drug: cycloSPORINE   Stiolto Respimat 2.5-2.5 MCG/ACT Aers Generic  drug: Tiotropium Bromide-Olodaterol Inhale 2 puffs into the lungs daily.   tiZANidine 4 MG tablet Commonly known as: ZANAFLEX TAKE 1 TABLET (4 MG TOTAL) BY MOUTH EVERY 6 (SIX) HOURS AS NEEDED FOR MUSCLE SPASMS.   traMADol 50 MG tablet Commonly known as: ULTRAM TAKE 1-2 TABLETS (50-100 MG TOTAL) BY MOUTH EVERY 6 (SIX) HOURS AS NEEDED. FOR PAIN   traZODone 50 MG tablet Commonly known as: DESYREL TAKE 1/2 TO 1 TABLET BY MOUTH AT BEDTIME AS NEEDED FOR SLEEP       Review of systems negative except as noted in HPI / PMHx or noted below:  Review of Systems  Constitutional: Negative.   HENT: Negative.   Eyes: Negative.   Respiratory: Negative.   Cardiovascular: Negative.   Gastrointestinal: Negative.   Genitourinary: Negative.   Musculoskeletal: Negative.   Skin: Negative.   Neurological: Negative.  Endo/Heme/Allergies: Negative.   Psychiatric/Behavioral: Negative.     Family History  Problem Relation Age of Onset  . Heart disease Mother        Had pacemaker  . Lung cancer Father   . Hypertension Brother   . Diabetes Neg Hx   . Colon cancer Neg Hx   . Colon polyps Neg Hx   . Kidney disease Neg Hx   . Gallbladder disease Neg Hx   . Esophageal cancer Neg Hx   . Allergic rhinitis Neg Hx   . Angioedema Neg Hx   . Asthma Neg Hx   . Atopy Neg Hx   . Eczema Neg Hx   . Immunodeficiency Neg Hx   . Urticaria Neg Hx     Social History   Socioeconomic History  . Marital status: Married    Spouse name: Not on file  . Number of children: 10  . Years of education: Not on file  . Highest education level: Not on file  Occupational History  . Occupation: Retired  Tobacco Use  . Smoking status: Former Smoker    Packs/day: 0.50    Years: 25.00    Pack years: 12.50    Types: Cigarettes    Quit date: 10/29/1972    Years since quitting: 47.2  . Smokeless tobacco: Never Used  Substance and Sexual Activity  . Alcohol use: No    Alcohol/week: 0.0 standard drinks  . Drug  use: No  . Sexual activity: Not on file  Other Topics Concern  . Not on file  Social History Narrative  . Not on file    Environmental and Social history  Lives in a house with a dry environment, no animals located inside the household, hardwood in the bedroom, no plastic on the bed, no plastic on the pillow, tobacco smoke exposure inside the household.  Objective:   Vitals:   01/27/20 1004  BP: 124/82  Pulse: 83  Resp: 16  Temp: 98.7 F (37.1 C)  SpO2: 94%   Height: 5\' 4"  (162.6 cm) Weight: 203 lb (92.1 kg)  Physical Exam Constitutional:      Appearance: She is not diaphoretic.  HENT:     Head: Normocephalic.     Right Ear: Tympanic membrane, ear canal and external ear normal.     Left Ear: Tympanic membrane, ear canal and external ear normal.     Nose: Nose normal. No mucosal edema or rhinorrhea.     Mouth/Throat:     Mouth: Mucous membranes are dry.     Tongue: Lesions (Diffusely erythematous with extensive furrows ) present.     Pharynx: Uvula midline. No oropharyngeal exudate.  Eyes:     Conjunctiva/sclera: Conjunctivae normal.  Neck:     Thyroid: No thyromegaly.     Trachea: Trachea normal. No tracheal tenderness or tracheal deviation.  Cardiovascular:     Rate and Rhythm: Normal rate and regular rhythm.     Heart sounds: Normal heart sounds, S1 normal and S2 normal. No murmur heard.   Pulmonary:     Effort: No respiratory distress.     Breath sounds: Normal breath sounds. No stridor. No wheezing or rales.  Lymphadenopathy:     Head:     Right side of head: No tonsillar adenopathy.     Left side of head: No tonsillar adenopathy.     Cervical: No cervical adenopathy.  Skin:    Findings: No erythema or rash.     Nails: There is no clubbing.  Neurological:     Mental Status: She is alert.     Diagnostics: Allergy skin tests were performed.  She did not demonstrate any hypersensitivity against a screening panel of aeroallergens or foods.  Review of  blood test dated 15 October 2019 identifies WBC 9.2, absolute eosinophil 100, absolute lymphocyte 2200, hemoglobin 11.4, platelet 303, creatinine 0.98 NG/DL, AST 14 U/L, ALT 8U/L  Review of blood tests dated 07 December 2018 identifies vitamin B1 10 nmol/L, B12 448 PG/mL  Assessment and Plan:    1. Glossitis   2. Sicca syndrome ([place])   3. COPD with asthma ([place])     1. Allergen avoidance measures?  2. Discontinue Stiolto.   3. Start Symbicort 160 - 2 inhalations 2 times per day with spacer  3. Discontinue all antihistamines (cetirizine)  4. Continue Biotene mouth rinse daily  5.  Ditropan???,  Hydralazine???,  Clonidine???  6.  Blood - B12, Folate, ANA w/R  7. Return to clinic in 4 weeks or earlier if problem  8. Plan for Fall flu vaccine and Covid Booster  Wendy Torres has inflammation of her oral cavity and extremely bad sicca syndrome.  I have eliminated her anticholinergic inhaler component and will have her discontinue all antihistamine use and obtain a few screening blood tests for systemic disease contributing to her issue and I will see her back in his clinic in 4 weeks or earlier if there is a problem.  If she has no favorable response to this approach and we need to consider eliminating some of the other pharmaceutical agents contributing to her dry mouth syndrome and we need to consider the possibility that her glossitis is a manifestation of an infiltrative disease such as amyloidosis.  Jiles Prows, MD Allergy / Immunology [facility]

## 2020-01-28 ENCOUNTER — Encounter: Payer: Self-pay | Admitting: Allergy and Immunology

## 2020-01-28 LAB — ANA W/REFLEX: Anti Nuclear Antibody (ANA): NEGATIVE

## 2020-01-28 LAB — B12 AND FOLATE PANEL
Folate: 5.5 ng/mL (ref >3.0)
Vitamin B-12: 581 pg/mL (ref 232–1245)

## 2020-01-30 DIAGNOSIS — Z471 Aftercare following joint replacement surgery: Secondary | ICD-10-CM | POA: Diagnosis not present

## 2020-01-30 DIAGNOSIS — Z96642 Presence of left artificial hip joint: Secondary | ICD-10-CM | POA: Diagnosis not present

## 2020-01-30 DIAGNOSIS — M6281 Muscle weakness (generalized): Secondary | ICD-10-CM | POA: Diagnosis not present

## 2020-01-30 DIAGNOSIS — R269 Unspecified abnormalities of gait and mobility: Secondary | ICD-10-CM | POA: Diagnosis not present

## 2020-02-02 ENCOUNTER — Other Ambulatory Visit: Payer: Self-pay | Admitting: Internal Medicine

## 2020-02-11 ENCOUNTER — Other Ambulatory Visit: Payer: Self-pay | Admitting: Internal Medicine

## 2020-02-17 DIAGNOSIS — Z20822 Contact with and (suspected) exposure to covid-19: Secondary | ICD-10-CM | POA: Diagnosis not present

## 2020-02-17 DIAGNOSIS — J449 Chronic obstructive pulmonary disease, unspecified: Secondary | ICD-10-CM | POA: Insufficient documentation

## 2020-03-01 DIAGNOSIS — M6281 Muscle weakness (generalized): Secondary | ICD-10-CM | POA: Diagnosis not present

## 2020-03-01 DIAGNOSIS — Z471 Aftercare following joint replacement surgery: Secondary | ICD-10-CM | POA: Diagnosis not present

## 2020-03-01 DIAGNOSIS — Z96642 Presence of left artificial hip joint: Secondary | ICD-10-CM | POA: Diagnosis not present

## 2020-03-01 DIAGNOSIS — R269 Unspecified abnormalities of gait and mobility: Secondary | ICD-10-CM | POA: Diagnosis not present

## 2020-03-10 ENCOUNTER — Other Ambulatory Visit: Payer: Self-pay | Admitting: Internal Medicine

## 2020-03-10 DIAGNOSIS — M8949 Other hypertrophic osteoarthropathy, multiple sites: Secondary | ICD-10-CM

## 2020-03-10 DIAGNOSIS — M159 Polyosteoarthritis, unspecified: Secondary | ICD-10-CM

## 2020-03-11 NOTE — Telephone Encounter (Signed)
Done erx  [email or web address] reviewed  

## 2020-03-16 ENCOUNTER — Ambulatory Visit (INDEPENDENT_AMBULATORY_CARE_PROVIDER_SITE_OTHER): Payer: Medicare Other | Admitting: Allergy and Immunology

## 2020-03-16 ENCOUNTER — Other Ambulatory Visit: Payer: Self-pay

## 2020-03-16 ENCOUNTER — Encounter: Payer: Self-pay | Admitting: Allergy and Immunology

## 2020-03-16 VITALS — BP 138/72 | HR 76 | Temp 98.0°F | Resp 18

## 2020-03-16 DIAGNOSIS — K140 Glossitis: Secondary | ICD-10-CM

## 2020-03-16 DIAGNOSIS — Z23 Encounter for immunization: Secondary | ICD-10-CM

## 2020-03-16 DIAGNOSIS — J449 Chronic obstructive pulmonary disease, unspecified: Secondary | ICD-10-CM | POA: Diagnosis not present

## 2020-03-16 DIAGNOSIS — M3500 Sicca syndrome, unspecified: Secondary | ICD-10-CM | POA: Diagnosis not present

## 2020-03-16 MED ORDER — NYSTATIN 100000 UNIT/ML MT SUSP: OROMUCOSAL | 5 refills | Status: DC

## 2020-03-16 NOTE — Patient Instructions (Addendum)
  1. Continue Symbicort 160 - 2 inhalations 2 times per day with spacer  2. Start nystatin 5 mls swish and spit after 2 times a day after Symbicort  3. Continue Biotene mouth rinse daily  4. Remain away from antihistamine use  5.  Discuss with Dr. Jenny Reichmann about replacing Ditropan,  Hydralazine, Clonidine  6.  Blood -SPEP with immunofixation  7. Return to clinic in 12 weeks or earlier if problem  8. Flu vaccine administered in clinic today  9. Obtain Covid Booster

## 2020-03-16 NOTE — Progress Notes (Signed)
[place] - [place] - [place]   Follow-up Note  Referring Provider: Biagio Borg, MD Primary Provider: Biagio Borg, MD Date of Office Visit: 03/16/2020  Subjective:   Wendy Torres (DOB: [birth year]) is a 79 y.o. female who returns to the [facility] [place] on 03/16/2020 in re-evaluation of the following:  HPI: Makyna presents to this clinic in evaluation of glossitis and COPD with asthma and sicca syndrome addressed during her initial evaluation of 27 January 2020.  Her mouth feels better as it is not as dry since she eliminated her anticholinergic inhaler and is now just using a dual inhaler and she has had very good control of her airway issue unchanged as a result of this medication substitution.  In addition, she has eliminated all antihistamine use.  But, her mouth is still irritated and it still is not very pleasant to eat because of this irritation.  She has received 2 Covid vaccines.  Allergies as of 03/16/2020   No Known Allergies     Medication List    acetaminophen 325 MG tablet Commonly known as: TYLENOL Take 2 tablets (650 mg total) by mouth every 6 (six) hours as needed for mild pain (or Fever >/= 101).   ALPRAZolam 0.25 MG tablet Commonly known as: XANAX Take 1 tablet (0.25 mg total) by mouth 2 (two) times daily as needed.   amLODipine-benazepril 10-40 MG capsule Commonly known as: LOTREL TAKE 1 CAPSULE BY MOUTH  DAILY   budesonide-formoterol 160-4.5 MCG/ACT inhaler Commonly known as: Symbicort Inhale 2 puffs into the lungs 2 (two) times daily.   cloNIDine 0.3 MG tablet Commonly known as: CATAPRES TAKE 1 TABLET BY MOUTH  TWICE DAILY   furosemide 20 MG tablet Commonly known as: LASIX Take 1 tablet (20 mg total) by mouth every morning.   gabapentin 300 MG capsule Commonly known as: NEURONTIN TAKE 1 CAPSULE BY MOUTH 3  TIMES DAILY   hydrALAZINE 25 MG tablet Commonly known as: APRESOLINE TAKE 1 TABLET BY  MOUTH 3  TIMES DAILY   lovastatin 40 MG tablet Commonly known as: MEVACOR TAKE 1 TABLET BY MOUTH AT  BEDTIME   nystatin 100000 UNIT/ML suspension Commonly known as: MYCOSTATIN Swish and spit 21mL 2 times daily after using Symbicort. Started by: Jiles Prows, MD   oxybutynin 5 MG tablet Commonly known as: DITROPAN TAKE 1 TABLET BY MOUTH 3  TIMES DAILY   pantoprazole 40 MG tablet Commonly known as: PROTONIX TAKE 1 TABLET BY MOUTH  DAILY   ProAir HFA 108 (90 Base) MCG/ACT inhaler Generic drug: albuterol TAKE 2 PUFFS BY MOUTH EVERY 6 HOURS AS NEEDED FOR WHEEZE OR SHORTNESS OF BREATH   Restasis 0.05 % ophthalmic emulsion Generic drug: cycloSPORINE   tiZANidine 4 MG tablet Commonly known as: ZANAFLEX TAKE 1 TABLET (4 MG TOTAL) BY MOUTH EVERY 6 (SIX) HOURS AS NEEDED FOR MUSCLE SPASMS.   traMADol 50 MG tablet Commonly known as: ULTRAM TAKE 1-2 TABLETS (50-100 MG TOTAL) BY MOUTH EVERY 6 (SIX) HOURS AS NEEDED. FOR PAIN   traZODone 50 MG tablet Commonly known as: DESYREL TAKE 1/2 TO 1 TABLET BY MOUTH AT BEDTIME AS NEEDED FOR SLEEP       Past Medical History:  Diagnosis Date  . ABNORMAL CHEST XRAY 11/05/2008  . ACUTE URIS OF UNSPECIFIED SITE 10/22/2007  . ALLERGIC RHINITIS 12/09/2006  . ANEMIA 01/14/2009  . Anxiety   . Arthritis   . ASTHMA 12/09/2006  . Asthma   .  Blood transfusion without reported diagnosis   . Cataract    bilateral -removed  . CHF (congestive heart failure) ([place])   . Chronic pain syndrome 02/16/2009  . COPD 12/09/2006  . DEGENERATIVE JOINT DISEASE 12/09/2006  . DIZZINESS 03/30/2010  . DYSPNEA 01/27/2010   with heavy exertion  . FREQUENCY, URINARY 03/30/2010  . GERD 12/09/2006  . Glossitis 01/14/2009  . HELICOBACTER PYLORI GASTRITIS, HX OF 12/09/2006  . HIATAL HERNIA 03/01/2010  . History of hiatal hernia   . HYPERLIPIDEMIA 04/18/2007  . HYPERSOMNIA 08/31/2008  . HYPERTENSION 12/09/2006  . INSOMNIA-SLEEP DISORDER-UNSPEC 11/05/2008  . LOW BACK PAIN 12/09/2006    . Morbid obesity ([place]) 12/09/2006  . Nonspecific (abnormal) findings on radiological and other examination of body structure 11/05/2008  . OSTEOPOROSIS 12/09/2006  . OVERACTIVE BLADDER 04/14/2008  . PALPITATIONS, RECURRENT 01/27/2010  . Stroke [facility])    pt. stated light one years ago  . SYNCOPE 11/05/2008  . TRANSIENT ISCHEMIC ATTACK, HX OF 12/09/2006    Past Surgical History:  Procedure Laterality Date  . COLONOSCOPY    . LUMBAR DISC SURGERY    . TONSILLECTOMY     as a child  . TOTAL HIP ARTHROPLASTY Right 11/06/2012   Procedure: RIGHT TOTAL HIP ARTHROPLASTY WITH ACETABULAR AUTOGRAFT;  Surgeon: Gearlean Alf, MD;  Location: [facility];  Service: Orthopedics;  Laterality: Right;  . TOTAL HIP ARTHROPLASTY Left 02/18/2014   Procedure: LEFT TOTAL HIP ARTHROPLASTY;  Surgeon: Gearlean Alf, MD;  Location: [facility];  Service: Orthopedics;  Laterality: Left;  . TUBAL LIGATION      Review of systems negative except as noted in HPI / PMHx or noted below:  Review of Systems  Constitutional: Negative.   HENT: Negative.   Eyes: Negative.   Respiratory: Negative.   Cardiovascular: Negative.   Gastrointestinal: Negative.   Genitourinary: Negative.   Musculoskeletal: Negative.   Skin: Negative.   Neurological: Negative.   Endo/Heme/Allergies: Negative.   Psychiatric/Behavioral: Negative.      Objective:   Vitals:   03/16/20 0913  BP: 138/72  Pulse: 76  Resp: 18  Temp: 98 F (36.7 C)  SpO2: 96%          Physical Exam HENT:     Mouth/Throat:     Tongue: Lesions (Diffusely erythematous with furrows) present.     Diagnostics:    Results of blood tests obtained 27 January 2020 identified negative ANA.  Assessment and Plan:   1. Glossitis   2. Sicca syndrome ([place])   3. COPD with asthma ([place])   4. Need for immunization against influenza     1. Continue Symbicort 160 - 2 inhalations 2 times per day with spacer  2. Start nystatin 5 mls swish and spit after 2 times a day  after Symbicort  3. Continue Biotene mouth rinse daily  4. Remain away from antihistamine use  5.  Discuss with Dr. Jenny Reichmann about replacing Ditropan,  Hydralazine, Clonidine  6.  Blood -SPEP with immunofixation  7. Return to clinic in 12 weeks or earlier if problem  8. Flu vaccine administered in clinic today  9. Obtain Covid Booster  Mylin will now use nystatin swish and spit after her Symbicort use to address the issue of possible fungal overgrowth and she will remain away from antihistamine use and continue to use her Biotene mouthwash on a regular basis.  To be complete, we will check an SPEP with immunofixation to look for possible amyloidosis contributing to her tongue issue.  I  think she needs to have a discussion with Dr. Jenny Reichmann about possibly replacing her Ditropan or hydralazine or clonidine as this may be producing some degree of oral cavity dryness and irritation.  I will see her back in this clinic in 12 weeks or earlier if there is a problem.  Allena Katz, MD Allergy / Immunology [place]

## 2020-03-17 ENCOUNTER — Encounter: Payer: Self-pay | Admitting: Allergy and Immunology

## 2020-03-17 ENCOUNTER — Telehealth: Payer: Self-pay | Admitting: Internal Medicine

## 2020-03-17 LAB — PROTEIN ELECTROPHORESIS, SERUM
A/G Ratio: 0.9 (ref 0.7–1.7)
Albumin ELP: 3.6 g/dL (ref 2.9–4.4)
Alpha 1: 0.3 g/dL (ref 0.0–0.4)
Alpha 2: 1 g/dL (ref 0.4–1.0)
Beta: 1.2 g/dL (ref 0.7–1.3)
Gamma Globulin: 1.5 g/dL (ref 0.4–1.8)
Globulin, Total: 4 g/dL — ABNORMAL HIGH (ref 2.2–3.9)
Total Protein: 7.6 g/dL (ref 6.0–8.5)

## 2020-03-17 NOTE — Telephone Encounter (Signed)
Patients daughter calling because patient saw Dr. Neldon Mc yesterday and he wanted her to reach out and see if we could change from having these three medications to just one medication because he believes its too much.   oxybutynin (DITROPAN) 5 MG tablet cloNIDine (CATAPRES) 0.3 MG tablet hydrALAZINE (APRESOLINE) 25 MG tablet Hassan Rowan # [phone]

## 2020-03-18 NOTE — Telephone Encounter (Signed)
Sent to Dr. John to advise. 

## 2020-03-18 NOTE — Telephone Encounter (Signed)
Needs OV b/c of the complexity, thanks

## 2020-03-19 NOTE — Telephone Encounter (Signed)
Scheduled appointment for patient on 03/31/20

## 2020-03-24 ENCOUNTER — Other Ambulatory Visit: Payer: Self-pay | Admitting: Internal Medicine

## 2020-03-30 ENCOUNTER — Other Ambulatory Visit: Payer: Self-pay

## 2020-03-31 ENCOUNTER — Ambulatory Visit: Payer: Medicare Other | Admitting: Internal Medicine

## 2020-03-31 DIAGNOSIS — R269 Unspecified abnormalities of gait and mobility: Secondary | ICD-10-CM | POA: Diagnosis not present

## 2020-03-31 DIAGNOSIS — M6281 Muscle weakness (generalized): Secondary | ICD-10-CM | POA: Diagnosis not present

## 2020-03-31 DIAGNOSIS — Z471 Aftercare following joint replacement surgery: Secondary | ICD-10-CM | POA: Diagnosis not present

## 2020-03-31 DIAGNOSIS — Z96642 Presence of left artificial hip joint: Secondary | ICD-10-CM | POA: Diagnosis not present

## 2020-04-07 ENCOUNTER — Encounter: Payer: Self-pay | Admitting: Internal Medicine

## 2020-04-07 ENCOUNTER — Other Ambulatory Visit: Payer: Self-pay

## 2020-04-07 ENCOUNTER — Ambulatory Visit (INDEPENDENT_AMBULATORY_CARE_PROVIDER_SITE_OTHER): Payer: Medicare Other | Admitting: Internal Medicine

## 2020-04-07 VITALS — BP 140/86 | HR 72 | Temp 98.7°F | Ht 64.0 in | Wt 198.0 lb

## 2020-04-07 DIAGNOSIS — I10 Essential (primary) hypertension: Secondary | ICD-10-CM

## 2020-04-07 DIAGNOSIS — J449 Chronic obstructive pulmonary disease, unspecified: Secondary | ICD-10-CM

## 2020-04-07 DIAGNOSIS — E785 Hyperlipidemia, unspecified: Secondary | ICD-10-CM | POA: Diagnosis not present

## 2020-04-07 DIAGNOSIS — R7303 Prediabetes: Secondary | ICD-10-CM

## 2020-04-07 MED ORDER — HYDRALAZINE HCL 50 MG PO TABS: 50.0000 mg | ORAL_TABLET | Freq: Three times a day (TID) | ORAL | 3 refills | Status: DC

## 2020-04-07 MED ORDER — CLONIDINE 0.1 MG/24HR TD PTWK: 0.1000 mg | MEDICATED_PATCH | TRANSDERMAL | 3 refills | Status: DC

## 2020-04-07 NOTE — Progress Notes (Signed)
Subjective:    Patient ID: Wendy Torres, female    DOB: Aug 09, 1940, 79 y.o.   MRN: [id number]  HPI  Here to f/u; overall doing ok,  Pt denies chest pain, increasing sob or doe, wheezing, orthopnea, PND, increased LE swelling, palpitations, dizziness or syncope.  Pt denies new neurological symptoms such as new headache, or facial or extremity weakness or numbness.  Pt denies polydipsia, polyuria, or low sugar episode.  Pt states overall good compliance with meds, mostly trying to follow appropriate diet, with wt overall stable,  but little exercise however. Has not yet had the booster.  No new complaints except has signficiant dry mouth with the high dose clonidine Past Medical History:  Diagnosis Date  . ABNORMAL CHEST XRAY 11/05/2008  . ACUTE URIS OF UNSPECIFIED SITE 10/22/2007  . ALLERGIC RHINITIS 12/09/2006  . ANEMIA 01/14/2009  . Anxiety   . Arthritis   . ASTHMA 12/09/2006  . Asthma   . Blood transfusion without reported diagnosis   . Cataract    bilateral -removed  . CHF (congestive heart failure) ([place])   . Chronic pain syndrome 02/16/2009  . COPD 12/09/2006  . DEGENERATIVE JOINT DISEASE 12/09/2006  . DIZZINESS 03/30/2010  . DYSPNEA 01/27/2010   with heavy exertion  . FREQUENCY, URINARY 03/30/2010  . GERD 12/09/2006  . Glossitis 01/14/2009  . HELICOBACTER PYLORI GASTRITIS, HX OF 12/09/2006  . HIATAL HERNIA 03/01/2010  . History of hiatal hernia   . HYPERLIPIDEMIA 04/18/2007  . HYPERSOMNIA 08/31/2008  . HYPERTENSION 12/09/2006  . INSOMNIA-SLEEP DISORDER-UNSPEC 11/05/2008  . LOW BACK PAIN 12/09/2006  . Morbid obesity ([place]) 12/09/2006  . Nonspecific (abnormal) findings on radiological and other examination of body structure 11/05/2008  . OSTEOPOROSIS 12/09/2006  . OVERACTIVE BLADDER 04/14/2008  . PALPITATIONS, RECURRENT 01/27/2010  . Stroke [facility])    pt. stated light one years ago  . SYNCOPE 11/05/2008  . TRANSIENT ISCHEMIC ATTACK, HX OF 12/09/2006   Past Surgical History:  Procedure  Laterality Date  . COLONOSCOPY    . LUMBAR DISC SURGERY    . TONSILLECTOMY     as a child  . TOTAL HIP ARTHROPLASTY Right 11/06/2012   Procedure: RIGHT TOTAL HIP ARTHROPLASTY WITH ACETABULAR AUTOGRAFT;  Surgeon: Gearlean Alf, MD;  Location: [facility];  Service: Orthopedics;  Laterality: Right;  . TOTAL HIP ARTHROPLASTY Left 02/18/2014   Procedure: LEFT TOTAL HIP ARTHROPLASTY;  Surgeon: Gearlean Alf, MD;  Location: [facility];  Service: Orthopedics;  Laterality: Left;  . TUBAL LIGATION      reports that she quit smoking about 47 years ago. Her smoking use included cigarettes. She has a 12.50 pack-year smoking history. She has never used smokeless tobacco. She reports that she does not drink alcohol and does not use drugs. family history includes Heart disease in her mother; Hypertension in her brother; Lung cancer in her father. No Known Allergies Current Outpatient Medications on File Prior to Visit  Medication Sig Dispense Refill  . acetaminophen (TYLENOL) 325 MG tablet Take 2 tablets (650 mg total) by mouth every 6 (six) hours as needed for mild pain (or Fever >/= 101). 40 tablet 0  . ALPRAZolam (XANAX) 0.25 MG tablet TAKE 1 TABLET BY MOUTH 2 TIMES DAILY AS NEEDED. 60 tablet 2  . amLODipine-benazepril (LOTREL) 10-40 MG capsule TAKE 1 CAPSULE BY MOUTH  DAILY 90 capsule 2  . budesonide-formoterol (SYMBICORT) 160-4.5 MCG/ACT inhaler Inhale 2 puffs into the lungs 2 (two) times daily. 1 each 5  . furosemide (  LASIX) 20 MG tablet Take 1 tablet (20 mg total) by mouth every morning. 90 tablet 3  . gabapentin (NEURONTIN) 300 MG capsule TAKE 1 CAPSULE BY MOUTH 3  TIMES DAILY 270 capsule 1  . lovastatin (MEVACOR) 40 MG tablet TAKE 1 TABLET BY MOUTH AT  BEDTIME 90 tablet 3  . nystatin (MYCOSTATIN) 100000 UNIT/ML suspension Swish and spit 19mL 2 times daily after using Symbicort. 300 mL 5  . oxybutynin (DITROPAN) 5 MG tablet TAKE 1 TABLET BY MOUTH 3  TIMES DAILY 270 tablet 3  . pantoprazole (PROTONIX) 40 MG  tablet TAKE 1 TABLET BY MOUTH  DAILY 90 tablet 3  . PROAIR HFA 108 (90 Base) MCG/ACT inhaler TAKE 2 PUFFS BY MOUTH EVERY 6 HOURS AS NEEDED FOR WHEEZE OR SHORTNESS OF BREATH 18 g 2  . RESTASIS 0.05 % ophthalmic emulsion     . tiZANidine (ZANAFLEX) 4 MG tablet TAKE 1 TABLET (4 MG TOTAL) BY MOUTH EVERY 6 (SIX) HOURS AS NEEDED FOR MUSCLE SPASMS. 50 tablet 1  . traMADol (ULTRAM) 50 MG tablet TAKE 1-2 TABLETS (50-100 MG TOTAL) BY MOUTH EVERY 6 (SIX) HOURS AS NEEDED. FOR PAIN 120 tablet 2  . traZODone (DESYREL) 50 MG tablet TAKE 1/2 TO 1 TABLET BY MOUTH AT BEDTIME AS NEEDED FOR SLEEP 90 tablet 1  . [DISCONTINUED] lansoprazole (PREVACID) 30 MG capsule Take 1 capsule (30 mg total) by mouth daily. 90 capsule 3   No current facility-administered medications on file prior to visit.   Review of Systems All otherwise neg per pt    Objective:   Physical Exam BP 140/86 (BP Location: Left Arm, Patient Position: Sitting, Cuff Size: Large)   Pulse 72   Temp 98.7 F (37.1 C) (Oral)   Ht 5\' 4"  (1.626 m)   Wt 198 lb (89.8 kg)   SpO2 94%   BMI 33.99 kg/m  VS noted,  Constitutional: Pt appears in NAD HENT: Head: NCAT.  Right Ear: External ear normal.  Left Ear: External ear normal.  Eyes: . Pupils are equal, round, and reactive to light. Conjunctivae and EOM are normal Nose: without d/c or deformity Neck: Neck supple. Gross normal ROM Cardiovascular: Normal rate and regular rhythm.   Pulmonary/Chest: Effort normal and breath sounds without rales or wheezing.  Abd:  Soft, NT, ND, + BS, no organomegaly Neurological: Pt is alert. At baseline orientation, motor grossly intact Skin: Skin is warm. No rashes, other new lesions, no LE edema Psychiatric: Pt behavior is normal without agitation  All otherwise neg per pt Lab Results  Component Value Date   WBC 9.2 10/15/2019   HGB 11.4 (L) 10/15/2019   HCT 34.9 (L) 10/15/2019   PLT 303.0 10/15/2019   GLUCOSE 98 10/15/2019   CHOL 134 10/15/2019   TRIG  78.0 10/15/2019   HDL 55.50 10/15/2019   LDLCALC 63 10/15/2019   ALT 8 10/15/2019   AST 14 10/15/2019   NA 131 (L) 10/15/2019   K 3.8 10/15/2019   CL 97 10/15/2019   CREATININE 0.98 10/15/2019   BUN 11 10/15/2019   CO2 30 10/15/2019   TSH 1.20 10/15/2019   INR 1.02 02/10/2014   HGBA1C 6.1 10/15/2019      Assessment & Plan:

## 2020-04-07 NOTE — Patient Instructions (Addendum)
Please consider having the COVID booster soon  Ok to decrease the clonidine to 0.1 mg - 1 PATCH per WEEK to avoid the dry mouth  Ok to increase the hydralazine to 50 mg three times per day since we are decreasing the clonidine  Your A1c was OK today  Please continue all other medications as before  Please have the pharmacy call with any other refills you may need.  Please continue your efforts at being more active, low cholesterol diet, and weight control.  Please keep your appointments with your specialists as you may have planned  Please make an Appointment to return in 6 months, or sooner if needed

## 2020-04-08 ENCOUNTER — Other Ambulatory Visit: Payer: Self-pay

## 2020-04-08 ENCOUNTER — Telehealth: Payer: Self-pay | Admitting: Internal Medicine

## 2020-04-08 MED ORDER — CLONIDINE 0.1 MG/24HR TD PTWK: 0.1000 mg | MEDICATED_PATCH | TRANSDERMAL | 3 refills | Status: DC

## 2020-04-08 MED ORDER — HYDRALAZINE HCL 50 MG PO TABS
50.0000 mg | ORAL_TABLET | Freq: Three times a day (TID) | ORAL | 3 refills | Status: DC
Start: 2020-04-08 — End: 2020-05-03

## 2020-04-08 NOTE — Telephone Encounter (Signed)
Patient's daughter called and said that cloNIDine (CATAPRES - DOSED IN MG/24 HR) 0.1 mg/24hr patch  And hydrALAZINE (APRESOLINE) 50 MG tablet  Needed to be sent to [id number] - [place], [place] [address]

## 2020-04-10 ENCOUNTER — Encounter: Payer: Self-pay | Admitting: Internal Medicine

## 2020-04-10 NOTE — Assessment & Plan Note (Addendum)
With dry mouth will decrease the clonidine to .01 mg patch weekly, also increase the hydrlalazine to 50 tid, f/u bp at home and next visit, o/w stable overall by history and exam, recent data reviewed with pt, and pt to continue medical treatment as before,  to f/u any worsening symptoms or concerns  I spent 31 minutes in preparing to see the patient by review of recent labs, imaging and procedures, obtaining and reviewing separately obtained history, communicating with the patient and family or caregiver, ordering medications, tests or procedures, and documenting clinical information in the EHR including the differential Dx, treatment, and any further evaluation and other management of htn, hyperglycemia, hld, copd

## 2020-04-10 NOTE — Assessment & Plan Note (Signed)
stable overall by history and exam, recent data reviewed with pt, and pt to continue medical treatment as before,  to f/u any worsening symptoms or concerns  

## 2020-05-01 ENCOUNTER — Emergency Department (HOSPITAL_COMMUNITY): Payer: Medicare Other

## 2020-05-01 ENCOUNTER — Emergency Department (HOSPITAL_COMMUNITY)
Admission: EM | Admit: 2020-05-01 | Discharge: 2020-05-02 | Disposition: A | Discharge status: Home / Self Care | Payer: Medicare Other | Attending: Emergency Medicine | Admitting: Emergency Medicine

## 2020-05-01 ENCOUNTER — Other Ambulatory Visit: Payer: Self-pay

## 2020-05-01 DIAGNOSIS — I110 Hypertensive heart disease with heart failure: Secondary | ICD-10-CM | POA: Insufficient documentation

## 2020-05-01 DIAGNOSIS — Z471 Aftercare following joint replacement surgery: Secondary | ICD-10-CM | POA: Diagnosis not present

## 2020-05-01 DIAGNOSIS — Z7951 Long term (current) use of inhaled steroids: Secondary | ICD-10-CM | POA: Diagnosis not present

## 2020-05-01 DIAGNOSIS — Z87891 Personal history of nicotine dependence: Secondary | ICD-10-CM | POA: Insufficient documentation

## 2020-05-01 DIAGNOSIS — R6889 Other general symptoms and signs: Secondary | ICD-10-CM | POA: Diagnosis not present

## 2020-05-01 DIAGNOSIS — N200 Calculus of kidney: Secondary | ICD-10-CM | POA: Diagnosis not present

## 2020-05-01 DIAGNOSIS — I10 Essential (primary) hypertension: Secondary | ICD-10-CM

## 2020-05-01 DIAGNOSIS — Z79899 Other long term (current) drug therapy: Secondary | ICD-10-CM | POA: Insufficient documentation

## 2020-05-01 DIAGNOSIS — Z743 Need for continuous supervision: Secondary | ICD-10-CM | POA: Diagnosis not present

## 2020-05-01 DIAGNOSIS — J45909 Unspecified asthma, uncomplicated: Secondary | ICD-10-CM | POA: Insufficient documentation

## 2020-05-01 DIAGNOSIS — R0902 Hypoxemia: Secondary | ICD-10-CM | POA: Diagnosis not present

## 2020-05-01 DIAGNOSIS — Z96642 Presence of left artificial hip joint: Secondary | ICD-10-CM | POA: Diagnosis not present

## 2020-05-01 DIAGNOSIS — R42 Dizziness and giddiness: Secondary | ICD-10-CM | POA: Diagnosis not present

## 2020-05-01 DIAGNOSIS — N281 Cyst of kidney, acquired: Secondary | ICD-10-CM | POA: Diagnosis not present

## 2020-05-01 DIAGNOSIS — I509 Heart failure, unspecified: Secondary | ICD-10-CM | POA: Diagnosis not present

## 2020-05-01 DIAGNOSIS — Z8673 Personal history of transient ischemic attack (TIA), and cerebral infarction without residual deficits: Secondary | ICD-10-CM | POA: Diagnosis not present

## 2020-05-01 DIAGNOSIS — G4489 Other headache syndrome: Secondary | ICD-10-CM | POA: Diagnosis not present

## 2020-05-01 DIAGNOSIS — Z96643 Presence of artificial hip joint, bilateral: Secondary | ICD-10-CM | POA: Diagnosis not present

## 2020-05-01 DIAGNOSIS — J441 Chronic obstructive pulmonary disease with (acute) exacerbation: Secondary | ICD-10-CM | POA: Diagnosis not present

## 2020-05-01 DIAGNOSIS — G459 Transient cerebral ischemic attack, unspecified: Secondary | ICD-10-CM | POA: Diagnosis not present

## 2020-05-01 DIAGNOSIS — M6281 Muscle weakness (generalized): Secondary | ICD-10-CM | POA: Diagnosis not present

## 2020-05-01 DIAGNOSIS — R519 Headache, unspecified: Secondary | ICD-10-CM | POA: Diagnosis present

## 2020-05-01 DIAGNOSIS — R269 Unspecified abnormalities of gait and mobility: Secondary | ICD-10-CM | POA: Diagnosis not present

## 2020-05-01 LAB — CBC
HCT: 38 % (ref 36.0–46.0)
Hemoglobin: 12.2 g/dL (ref 12.0–15.0)
MCH: 27.4 pg (ref 26.0–34.0)
MCHC: 32.1 g/dL (ref 30.0–36.0)
MCV: 85.4 fL (ref 80.0–100.0)
Platelets: 313 10*3/uL (ref 150–400)
RBC: 4.45 MIL/uL (ref 3.87–5.11)
RDW: 15.1 % (ref 11.5–15.5)
WBC: 6.4 10*3/uL (ref 4.0–10.5)
nRBC: 0 % (ref 0.0–0.2)

## 2020-05-01 LAB — BASIC METABOLIC PANEL
Anion gap: 11 (ref 5–15)
BUN: 12 mg/dL (ref 8–23)
CO2: 27 mmol/L (ref 22–32)
Calcium: 10.6 mg/dL — ABNORMAL HIGH (ref 8.9–10.3)
Chloride: 99 mmol/L (ref 98–111)
Creatinine, Ser: 1.17 mg/dL — ABNORMAL HIGH (ref 0.44–1.00)
GFR, Estimated: 47 mL/min — ABNORMAL LOW (ref >60)
Glucose, Bld: 108 mg/dL — ABNORMAL HIGH (ref 70–99)
Potassium: 3.8 mmol/L (ref 3.5–5.1)
Sodium: 137 mmol/L (ref 135–145)

## 2020-05-01 NOTE — ED Triage Notes (Addendum)
Pt presents to ED BIB [facility]. Pt c/o HTN, HA, dizziness. Pt had recent medication change and has been HTN since EMS  210/90 HR - 90

## 2020-05-02 ENCOUNTER — Other Ambulatory Visit: Payer: Self-pay

## 2020-05-02 ENCOUNTER — Emergency Department (HOSPITAL_COMMUNITY)
Admission: EM | Admit: 2020-05-02 | Discharge: 2020-05-03 | Disposition: A | Discharge status: Home / Self Care | Payer: Medicare Other | Source: Home / Self Care | Attending: Emergency Medicine | Admitting: Emergency Medicine

## 2020-05-02 ENCOUNTER — Other Ambulatory Visit: Payer: Self-pay | Admitting: Internal Medicine

## 2020-05-02 DIAGNOSIS — Z7951 Long term (current) use of inhaled steroids: Secondary | ICD-10-CM | POA: Insufficient documentation

## 2020-05-02 DIAGNOSIS — Z87891 Personal history of nicotine dependence: Secondary | ICD-10-CM | POA: Insufficient documentation

## 2020-05-02 DIAGNOSIS — Z96643 Presence of artificial hip joint, bilateral: Secondary | ICD-10-CM | POA: Insufficient documentation

## 2020-05-02 DIAGNOSIS — I509 Heart failure, unspecified: Secondary | ICD-10-CM | POA: Insufficient documentation

## 2020-05-02 DIAGNOSIS — I10 Essential (primary) hypertension: Secondary | ICD-10-CM

## 2020-05-02 DIAGNOSIS — Z79899 Other long term (current) drug therapy: Secondary | ICD-10-CM | POA: Insufficient documentation

## 2020-05-02 DIAGNOSIS — J45909 Unspecified asthma, uncomplicated: Secondary | ICD-10-CM | POA: Insufficient documentation

## 2020-05-02 DIAGNOSIS — I110 Hypertensive heart disease with heart failure: Secondary | ICD-10-CM | POA: Insufficient documentation

## 2020-05-02 DIAGNOSIS — Z8673 Personal history of transient ischemic attack (TIA), and cerebral infarction without residual deficits: Secondary | ICD-10-CM | POA: Insufficient documentation

## 2020-05-02 DIAGNOSIS — J441 Chronic obstructive pulmonary disease with (acute) exacerbation: Secondary | ICD-10-CM | POA: Insufficient documentation

## 2020-05-02 MED ORDER — ACETAMINOPHEN 325 MG PO TABS
650.0000 mg | ORAL_TABLET | Freq: Once | ORAL | Status: AC
  Administered 2020-05-02: 650 mg via ORAL
  Filled 2020-05-02: qty 2

## 2020-05-02 MED ORDER — LABETALOL HCL 5 MG/ML IV SOLN
10.0000 mg | Freq: Once | INTRAVENOUS | Status: AC
  Administered 2020-05-02: 10 mg via INTRAVENOUS
  Filled 2020-05-02: qty 4

## 2020-05-02 MED ORDER — HYDRALAZINE HCL 25 MG PO TABS
75.0000 mg | ORAL_TABLET | Freq: Once | ORAL | Status: AC
  Administered 2020-05-02: 75 mg via ORAL
  Filled 2020-05-02: qty 3

## 2020-05-02 NOTE — ED Provider Notes (Signed)
[place] EMERGENCY DEPARTMENT Provider Note   CSN: [id number] Arrival date & time: 05/01/20  2247     History Chief Complaint  Patient presents with  . Hypertension  . Headache    Wendy Torres is a 80 y.o. female.  Patient presents to the emergency department for evaluation of headache and elevated blood pressure.  She is accompanied by her daughter.  Patient reports acute diffuse global headache without vision change.  She has had mild dizziness.  Patient denies chest pain, heart palpitations, shortness of breath.  She has recently had some medication changes and blood pressure has been running high ever since.        Past Medical History:  Diagnosis Date  . ABNORMAL CHEST XRAY 11/05/2008  . ACUTE URIS OF UNSPECIFIED SITE 10/22/2007  . ALLERGIC RHINITIS 12/09/2006  . ANEMIA 01/14/2009  . Anxiety   . Arthritis   . ASTHMA 12/09/2006  . Asthma   . Blood transfusion without reported diagnosis   . Cataract    bilateral -removed  . CHF (congestive heart failure) ([place])   . Chronic pain syndrome 02/16/2009  . COPD 12/09/2006  . DEGENERATIVE JOINT DISEASE 12/09/2006  . DIZZINESS 03/30/2010  . DYSPNEA 01/27/2010   with heavy exertion  . FREQUENCY, URINARY 03/30/2010  . GERD 12/09/2006  . Glossitis 01/14/2009  . HELICOBACTER PYLORI GASTRITIS, HX OF 12/09/2006  . HIATAL HERNIA 03/01/2010  . History of hiatal hernia   . HYPERLIPIDEMIA 04/18/2007  . HYPERSOMNIA 08/31/2008  . HYPERTENSION 12/09/2006  . INSOMNIA-SLEEP DISORDER-UNSPEC 11/05/2008  . LOW BACK PAIN 12/09/2006  . Morbid obesity ([place]) 12/09/2006  . Nonspecific (abnormal) findings on radiological and other examination of body structure 11/05/2008  . OSTEOPOROSIS 12/09/2006  . OVERACTIVE BLADDER 04/14/2008  . PALPITATIONS, RECURRENT 01/27/2010  . Stroke [facility])    pt. stated light one years ago  . SYNCOPE 11/05/2008  . TRANSIENT ISCHEMIC ATTACK, HX OF 12/09/2006    Patient Active Problem List   Diagnosis Date  Noted  . Pain and swelling of right knee 10/15/2019  . Prediabetes 10/03/2017  . Congestive heart failure (CHF) ([place]) 10/05/2016  . COPD exacerbation ([place]) 05/31/2016  . Elevated total protein 05/31/2016  . Tinnitus 05/05/2015  . Deficiency anemia 03/02/2014  . Constipation 11/12/2012  . Overactive bladder 11/12/2012  . OA (osteoarthritis) of hip 11/06/2012  . Dysphagia, unspecified(787.20) 10/23/2012  . Chest pain 10/23/2012  . Osteoporosis 05/15/2012  . DJD (degenerative joint disease) of knee 02/15/2012  . Olecranon bursitis of right elbow 11/23/2011  . Cervical radiculopathy 09/30/2011  . Encounter for preventative adult health care exam with abnormal findings 08/12/2010  . Thrush 08/12/2010  . HIATAL HERNIA 03/01/2010  . Chronic pain syndrome 02/16/2009  . Iron deficiency anemia 01/14/2009  . SYNCOPE 11/05/2008  . Insomnia 11/05/2008  . OVERACTIVE BLADDER 04/14/2008  . Dyslipidemia, goal LDL below 130 04/18/2007  . Morbid obesity ([place]) 12/09/2006  . Essential hypertension 12/09/2006  . Allergic rhinitis 12/09/2006  . Asthma 12/09/2006  . COPD with chronic bronchitis and emphysema ([place]) 12/09/2006  . GERD 12/09/2006  . Osteoarthritis 12/09/2006    Past Surgical History:  Procedure Laterality Date  . COLONOSCOPY    . LUMBAR DISC SURGERY    . TONSILLECTOMY     as a child  . TOTAL HIP ARTHROPLASTY Right 11/06/2012   Procedure: RIGHT TOTAL HIP ARTHROPLASTY WITH ACETABULAR AUTOGRAFT;  Surgeon: Gearlean Alf, MD;  Location[facility];  Service: Orthopedics;  Laterality: Right;  . TOTAL  HIP ARTHROPLASTY Left 02/18/2014   Procedure: LEFT TOTAL HIP ARTHROPLASTY;  Surgeon: Gearlean Alf, MD;  Location: [facility];  Service: Orthopedics;  Laterality: Left;  . TUBAL LIGATION       OB History   No obstetric history on file.     Family History  Problem Relation Age of Onset  . Heart disease Mother        Had pacemaker  . Lung cancer Father   . Hypertension Brother   .  Diabetes Neg Hx   . Colon cancer Neg Hx   . Colon polyps Neg Hx   . Kidney disease Neg Hx   . Gallbladder disease Neg Hx   . Esophageal cancer Neg Hx   . Allergic rhinitis Neg Hx   . Angioedema Neg Hx   . Asthma Neg Hx   . Atopy Neg Hx   . Eczema Neg Hx   . Immunodeficiency Neg Hx   . Urticaria Neg Hx     Social History   Tobacco Use  . Smoking status: Former Smoker    Packs/day: 0.50    Years: 25.00    Pack years: 12.50    Types: Cigarettes    Quit date: 10/29/1972    Years since quitting: 47.5  . Smokeless tobacco: Never Used  Vaping Use  . Vaping Use: Never used  Substance Use Topics  . Alcohol use: No    Alcohol/week: 0.0 standard drinks  . Drug use: No    Home Medications Prior to Admission medications   Medication Sig Start Date End Date Taking? Authorizing Provider  acetaminophen (TYLENOL) 325 MG tablet Take 2 tablets (650 mg total) by mouth every 6 (six) hours as needed for mild pain (or Fever >/= 101). 02/19/14   Perkins, Alexzandrew L, PA-C  ALPRAZolam (XANAX) 0.25 MG tablet TAKE 1 TABLET BY MOUTH 2 TIMES DAILY AS NEEDED. 03/24/20   Biagio Borg, MD  amLODipine-benazepril (LOTREL) 10-40 MG capsule TAKE 1 CAPSULE BY MOUTH  DAILY 11/28/19   Biagio Borg, MD  budesonide-formoterol [facility]) 160-4.5 MCG/ACT inhaler Inhale 2 puffs into the lungs 2 (two) times daily. 01/27/20   Kozlow, Donnamarie Poag, MD  cloNIDine (CATAPRES - DOSED IN MG/24 HR) 0.1 mg/24hr patch Place 1 patch (0.1 mg total) onto the skin once a week. 04/08/20   Biagio Borg, MD  furosemide (LASIX) 20 MG tablet Take 1 tablet (20 mg total) by mouth every morning. 08/09/19   Biagio Borg, MD  gabapentin (NEURONTIN) 300 MG capsule TAKE 1 CAPSULE BY MOUTH 3  TIMES DAILY 02/12/20   Biagio Borg, MD  hydrALAZINE (APRESOLINE) 50 MG tablet Take 1 tablet (50 mg total) by mouth 3 (three) times daily. 04/08/20   Biagio Borg, MD  lovastatin (MEVACOR) 40 MG tablet TAKE 1 TABLET BY MOUTH AT  BEDTIME 09/22/19   Biagio Borg, MD  nystatin (MYCOSTATIN) 100000 UNIT/ML suspension Swish and spit 26mL 2 times daily after using Symbicort. 03/16/20   Kozlow, Donnamarie Poag, MD  oxybutynin (DITROPAN) 5 MG tablet TAKE 1 TABLET BY MOUTH 3  TIMES DAILY 02/12/20   Biagio Borg, MD  pantoprazole (PROTONIX) 40 MG tablet TAKE 1 TABLET BY MOUTH  DAILY 02/12/20   Biagio Borg, MD  PROAIR HFA 108 [phone] Base) MCG/ACT inhaler TAKE 2 PUFFS BY MOUTH EVERY 6 HOURS AS NEEDED FOR WHEEZE OR SHORTNESS OF BREATH 10/28/18   Biagio Borg, MD  RESTASIS 0.05 % ophthalmic emulsion  10/01/19  [provider]  tiZANidine (ZANAFLEX) 4 MG tablet TAKE 1 TABLET (4 MG TOTAL) BY MOUTH EVERY 6 (SIX) HOURS AS NEEDED FOR MUSCLE SPASMS. 08/27/19   Corwin Levins, MD  traMADol (ULTRAM) 50 MG tablet TAKE 1-2 TABLETS (50-100 MG TOTAL) BY MOUTH EVERY 6 (SIX) HOURS AS NEEDED. FOR PAIN 03/11/20   Corwin Levins, MD  traZODone (DESYREL) 50 MG tablet TAKE 1/2 TO 1 TABLET BY MOUTH AT BEDTIME AS NEEDED FOR SLEEP 11/10/19   Corwin Levins, MD  lansoprazole (PREVACID) 30 MG capsule Take 1 capsule (30 mg total) by mouth daily. 04/18/11 07/13/11  Corwin Levins, MD    Allergies    Patient has no known allergies.  Review of Systems   Review of Systems  Respiratory: Negative for shortness of breath.   Cardiovascular: Negative for chest pain.  Neurological: Positive for dizziness and headaches.  All other systems reviewed and are negative.   Physical Exam Updated Vital Signs BP (!) 150/80 (BP Location: Right Arm)   Pulse 79   Temp 98.4 F (36.9 C) (Oral)   Resp 16   SpO2 97%   Physical Exam Vitals and nursing note reviewed.  Constitutional:      General: She is not in acute distress.    Appearance: Normal appearance. She is well-developed and well-nourished.  HENT:     Head: Normocephalic and atraumatic.     Right Ear: Hearing normal.     Left Ear: Hearing normal.     Nose: Nose normal.     Mouth/Throat:     Mouth: Oropharynx is clear and moist and mucous  membranes are normal.  Eyes:     Extraocular Movements: EOM normal.     Conjunctiva/sclera: Conjunctivae normal.     Pupils: Pupils are equal, round, and reactive to light.  Cardiovascular:     Rate and Rhythm: Regular rhythm.     Heart sounds: S1 normal and S2 normal. No murmur heard. No friction rub. No gallop.   Pulmonary:     Effort: Pulmonary effort is normal. No respiratory distress.     Breath sounds: Normal breath sounds.  Chest:     Chest wall: No tenderness.  Abdominal:     General: Bowel sounds are normal.     Palpations: Abdomen is soft. There is no hepatosplenomegaly.     Tenderness: There is no abdominal tenderness. There is no guarding or rebound. Negative signs include Murphy's sign and McBurney's sign.     Hernia: No hernia is present.  Musculoskeletal:        General: Normal range of motion.     Cervical back: Normal range of motion and neck supple.  Skin:    General: Skin is warm, dry and intact.     Findings: No rash.     Nails: There is no cyanosis.  Neurological:     Mental Status: She is alert and oriented to person, place, and time.     GCS: GCS eye subscore is 4. GCS verbal subscore is 5. GCS motor subscore is 6.     Cranial Nerves: No cranial nerve deficit.     Sensory: No sensory deficit.     Coordination: Coordination normal.     Deep Tendon Reflexes: Strength normal.  Psychiatric:        Mood and Affect: Mood and affect normal.        Speech: Speech normal.        Behavior: Behavior normal.  Thought Content: Thought content normal.     ED Results / Procedures / Treatments   Labs (all labs ordered are listed, but only abnormal results are displayed) Labs Reviewed  BASIC METABOLIC PANEL - Abnormal; Notable for the following components:      Result Value   Glucose, Bld 108 (*)    Creatinine, Ser 1.17 (*)    Calcium 10.6 (*)    GFR, Estimated 47 (*)    All other components within normal limits  CBC    EKG None  Radiology CT  Head Wo Contrast  Result Date: 05/02/2020 CLINICAL DATA:  Dizziness EXAM: CT HEAD WITHOUT CONTRAST TECHNIQUE: Contiguous axial images were obtained from the base of the skull through the vertex without intravenous contrast. COMPARISON:  None. FINDINGS: Brain: No evidence of acute territorial infarction, hemorrhage, hydrocephalus,extra-axial collection or mass lesion/mass effect. There is dilatation the ventricles and sulci consistent with age-related atrophy. Low-attenuation changes in the deep white matter consistent with small vessel ischemia. Vascular: No hyperdense vessel or unexpected calcification. Skull: The skull is intact. No fracture or focal lesion identified. Sinuses/Orbits: The visualized paranasal sinuses and mastoid air cells are clear. The orbits and globes intact. Other: None IMPRESSION: No acute intracranial abnormality. Findings consistent with age related atrophy and chronic small vessel ischemia Electronically Signed   By: Prudencio Pair M.D.   On: 05/02/2020 00:01    Procedures Procedures (including critical care time)  Medications Ordered in ED Medications  hydrALAZINE (APRESOLINE) tablet 75 mg (75 mg Oral Given 05/02/20 0452)  acetaminophen (TYLENOL) tablet 650 mg (650 mg Oral Given 05/02/20 0452)  labetalol (NORMODYNE) injection 10 mg (10 mg Intravenous Given 05/02/20 0620)    ED Course  I have reviewed the triage vital signs and the nursing notes.  Pertinent labs & imaging results that were available during my care of the patient were reviewed by me and considered in my medical decision making (see chart for details).    MDM Rules/Calculators/A&P                          Patient presents to the emergency department for evaluation of headache, dizziness in the setting of accelerated hypertension.  Basic labs are unremarkable.  Head CT is negative.  Patient's neurologic exam is normal.  She appears well.  Blood pressure treated with additional hydralazine, will follow up with  primary care for repeat blood pressure check and further instructions.  Patient and daughter instructed that she can take an additional hydralazine tablet as needed if blood pressure runs high again in the future.  Final Clinical Impression(s) / ED Diagnoses Final diagnoses:  Primary hypertension    Rx / DC Orders ED Discharge Orders    None       Bashar Milam, Gwenyth Allegra, MD 05/02/20 [phone]

## 2020-05-02 NOTE — ED Notes (Signed)
Patient verbalizes understanding of discharge instructions. Opportunity for questioning and answers were provided. Armband removed by staff, pt discharged from ED via wheelchair to lobby to return home with family.  

## 2020-05-02 NOTE — ED Triage Notes (Signed)
Pt presents to ED POV. Pt c/o HTN pt seen last night for same. Pt reports taking her bp was 188/99 . Pt took clonidine patch off d/t thinking it was increasing pressure

## 2020-05-02 NOTE — Discharge Instructions (Addendum)
Take your prescribed high blood pressure medications and follow-up with your doctor this week for recheck of blood pressure

## 2020-05-03 ENCOUNTER — Telehealth: Payer: Self-pay | Admitting: Internal Medicine

## 2020-05-03 ENCOUNTER — Emergency Department (HOSPITAL_COMMUNITY): Payer: Medicare Other

## 2020-05-03 DIAGNOSIS — N281 Cyst of kidney, acquired: Secondary | ICD-10-CM | POA: Diagnosis not present

## 2020-05-03 LAB — BASIC METABOLIC PANEL
Anion gap: 10 (ref 5–15)
BUN: 10 mg/dL (ref 8–23)
CO2: 30 mmol/L (ref 22–32)
Calcium: 10.2 mg/dL (ref 8.9–10.3)
Chloride: 96 mmol/L — ABNORMAL LOW (ref 98–111)
Creatinine, Ser: 0.97 mg/dL (ref 0.44–1.00)
GFR, Estimated: 59 mL/min — ABNORMAL LOW (ref >60)
Glucose, Bld: 104 mg/dL — ABNORMAL HIGH (ref 70–99)
Potassium: 4.1 mmol/L (ref 3.5–5.1)
Sodium: 136 mmol/L (ref 135–145)

## 2020-05-03 MED ORDER — HYDRALAZINE HCL 50 MG PO TABS: 100.0000 mg | ORAL_TABLET | Freq: Three times a day (TID) | ORAL | 3 refills | Status: DC

## 2020-05-03 MED ORDER — HYDRALAZINE HCL 25 MG PO TABS: 25.0000 mg | ORAL_TABLET | Freq: Once | ORAL | Status: DC

## 2020-05-03 MED ORDER — HYDRALAZINE HCL 25 MG PO TABS
50.0000 mg | ORAL_TABLET | Freq: Once | ORAL | Status: AC
  Administered 2020-05-03: 50 mg via ORAL
  Filled 2020-05-03: qty 2

## 2020-05-03 NOTE — ED Notes (Signed)
Called pt name for VS recheck. No response from pt.

## 2020-05-03 NOTE — Telephone Encounter (Signed)
Patient's daughter Yezenia Fredrick calling to report the patient is still at the ED waiting to be seen.  Her most recent BP 193/83. Daughter requesting a call for advice

## 2020-05-03 NOTE — Telephone Encounter (Signed)
[facility] FYI   Caller states that her mother's blood pressure is 204/100. Caller states patient's hands are red. Caller states patient is feeling dizzy.  [facility] advised: Go to ED now  Patient went to ED and was DC. Patient's daughter called back insisting that her mother needs to be admitted to the hosp and she is insisting to speak to the OC.  [facility] advised that they will reach out to the [facility] and that someone will be contacting her afterward.  Spoke to [facility] and situation relayed. OC stated that the ED provider will make the decision on whether the pt is admitted to the hosp, per standard procedure

## 2020-05-03 NOTE — ED Provider Notes (Signed)
Patient CARE signed out to reassess and follow-up ultrasound results.  Patient presented with uncontrolled blood pressure greater than 200 systolic With nonspecific symptoms.  On exam patient is overall well-appearing, blood pressure 220 systolic.  Patient received oral hydralazine. Plan of care was previously arranged to adjust home medications and follow-up closely in the office tomorrow.   Ultrasound results reviewed with possible signs of medical renal disease and cysts. Called nephrology to confirm outpatient follow-up.  CT Head Wo Contrast  Result Date: 05/02/2020 CLINICAL DATA:  Dizziness EXAM: CT HEAD WITHOUT CONTRAST TECHNIQUE: Contiguous axial images were obtained from the base of the skull through the vertex without intravenous contrast. COMPARISON:  None. FINDINGS: Brain: No evidence of acute territorial infarction, hemorrhage, hydrocephalus,extra-axial collection or mass lesion/mass effect. There is dilatation the ventricles and sulci consistent with age-related atrophy. Low-attenuation changes in the deep white matter consistent with small vessel ischemia. Vascular: No hyperdense vessel or unexpected calcification. Skull: The skull is intact. No fracture or focal lesion identified. Sinuses/Orbits: The visualized paranasal sinuses and mastoid air cells are clear. The orbits and globes intact. Other: None IMPRESSION: No acute intracranial abnormality. Findings consistent with age related atrophy and chronic small vessel ischemia Electronically Signed   By: Jonna Clark M.D.   On: 05/02/2020 00:01   US Renal  Result Date: 05/03/2020 CLINICAL DATA:  uncontrolled HTN EXAM: RENAL / URINARY TRACT ULTRASOUND COMPLETE COMPARISON:  02/01/2010 and prior. FINDINGS: Right Kidney: Renal measurements: 8.7 x 4.1 x 4.1 cm = volume: 75.1 mL. Increased parenchymal echogenicity. No hydronephrosis visualized. 1.7 cm interpole cyst. Left Kidney: Renal measurements: 9.3 x 4.4 x 3.8 cm = volume: 80.7 mL. Increased  parenchymal echogenicity. No hydronephrosis visualized. 6 mm echogenic interpole focus demonstrating twinkle artifact. 1.1 cm interpole cyst. Bladder: Appears normal for degree of bladder distention. Bilateral ureteral jets visualized. Other: None. IMPRESSION: Echogenic renal parenchyma may reflect sequela of medical renal disease. Bilateral renal cysts.  Nonobstructive left nephrolithiasis. Electronically Signed   By: Stana Bunting M.D.   On: 05/03/2020 15:09   Results and differential diagnosis were discussed with the patient/parent/guardian. Close follow up outpatient was discussed, comfortable with the plan.   Medications  hydrALAZINE (APRESOLINE) tablet 50 mg (50 mg Oral Given 05/03/20 1327)    Vitals:   05/03/20 0905 05/03/20 1238 05/03/20 1424 05/03/20 1600  BP: (!) 191/83 (!) 230/79 (!) 220/90 (!) 226/60  Pulse: 78 90 91   Resp: 18 16 16    Temp:  98.7 F (37.1 C)    TempSrc:  Oral    SpO2: 95% 100% 98%   Weight:      Height:        Final diagnoses:  Uncontrolled hypertension      , MD 05/03/20 1610

## 2020-05-03 NOTE — ED Notes (Addendum)
Patient transported to Ultrasound 

## 2020-05-03 NOTE — ED Provider Notes (Signed)
[facility] EMERGENCY DEPARTMENT Provider Note   CSN: [id number] Arrival date & time: 05/02/20  1756     History Chief Complaint  Patient presents with  . Hypertension    Wendy Torres is a 80 y.o. female.   Hypertension This is a chronic problem. The problem occurs constantly. The problem has not changed since onset.Associated symptoms include headaches (when hydralazine patch was on). Pertinent negatives include no chest pain, no abdominal pain and no shortness of breath. Exacerbated by: clonidine patch. Nothing relieves the symptoms. Treatments tried: increasing hydralazine. The treatment provided no relief.       Past Medical History:  Diagnosis Date  . ABNORMAL CHEST XRAY 11/05/2008  . ACUTE URIS OF UNSPECIFIED SITE 10/22/2007  . ALLERGIC RHINITIS 12/09/2006  . ANEMIA 01/14/2009  . Anxiety   . Arthritis   . ASTHMA 12/09/2006  . Asthma   . Blood transfusion without reported diagnosis   . Cataract    bilateral -removed  . CHF (congestive heart failure) (HCC)   . Chronic pain syndrome 02/16/2009  . COPD 12/09/2006  . DEGENERATIVE JOINT DISEASE 12/09/2006  . DIZZINESS 03/30/2010  . DYSPNEA 01/27/2010   with heavy exertion  . FREQUENCY, URINARY 03/30/2010  . GERD 12/09/2006  . Glossitis 01/14/2009  . HELICOBACTER PYLORI GASTRITIS, HX OF 12/09/2006  . HIATAL HERNIA 03/01/2010  . History of hiatal hernia   . HYPERLIPIDEMIA 04/18/2007  . HYPERSOMNIA 08/31/2008  . HYPERTENSION 12/09/2006  . INSOMNIA-SLEEP DISORDER-UNSPEC 11/05/2008  . LOW BACK PAIN 12/09/2006  . Morbid obesity (HCC) 12/09/2006  . Nonspecific (abnormal) findings on radiological and other examination of body structure 11/05/2008  . OSTEOPOROSIS 12/09/2006  . OVERACTIVE BLADDER 04/14/2008  . PALPITATIONS, RECURRENT 01/27/2010  . Stroke [facility])    pt. stated light one years ago  . SYNCOPE 11/05/2008  . TRANSIENT ISCHEMIC ATTACK, HX OF 12/09/2006    Patient Active Problem List   Diagnosis Date Noted  .  Pain and swelling of right knee 10/15/2019  . Prediabetes 10/03/2017  . Congestive heart failure (CHF) (HCC) 10/05/2016  . COPD exacerbation (HCC) 05/31/2016  . Elevated total protein 05/31/2016  . Tinnitus 05/05/2015  . Deficiency anemia 03/02/2014  . Constipation 11/12/2012  . Overactive bladder 11/12/2012  . OA (osteoarthritis) of hip 11/06/2012  . Dysphagia, unspecified(787.20) 10/23/2012  . Chest pain 10/23/2012  . Osteoporosis 05/15/2012  . DJD (degenerative joint disease) of knee 02/15/2012  . Olecranon bursitis of right elbow 11/23/2011  . Cervical radiculopathy 09/30/2011  . Encounter for preventative adult health care exam with abnormal findings 08/12/2010  . Thrush 08/12/2010  . HIATAL HERNIA 03/01/2010  . Chronic pain syndrome 02/16/2009  . Iron deficiency anemia 01/14/2009  . SYNCOPE 11/05/2008  . Insomnia 11/05/2008  . OVERACTIVE BLADDER 04/14/2008  . Dyslipidemia, goal LDL below 130 04/18/2007  . Morbid obesity (HCC) 12/09/2006  . Essential hypertension 12/09/2006  . Allergic rhinitis 12/09/2006  . Asthma 12/09/2006  . COPD with chronic bronchitis and emphysema (HCC) 12/09/2006  . GERD 12/09/2006  . Osteoarthritis 12/09/2006    Past Surgical History:  Procedure Laterality Date  . COLONOSCOPY    . LUMBAR DISC SURGERY    . TONSILLECTOMY     as a child  . TOTAL HIP ARTHROPLASTY Right 11/06/2012   Procedure: RIGHT TOTAL HIP ARTHROPLASTY WITH ACETABULAR AUTOGRAFT;  Surgeon: Loanne Drilling, MD;  Location: [facility];  Service: Orthopedics;  Laterality: Right;  . TOTAL HIP ARTHROPLASTY Left 02/18/2014   Procedure: LEFT TOTAL HIP ARTHROPLASTY;  Surgeon: Gearlean Alf, MD;  Location: [facility];  Service: Orthopedics;  Laterality: Left;  . TUBAL LIGATION       OB History   No obstetric history on file.     Family History  Problem Relation Age of Onset  . Heart disease Mother        Had pacemaker  . Lung cancer Father   . Hypertension Brother   . Diabetes Neg  Hx   . Colon cancer Neg Hx   . Colon polyps Neg Hx   . Kidney disease Neg Hx   . Gallbladder disease Neg Hx   . Esophageal cancer Neg Hx   . Allergic rhinitis Neg Hx   . Angioedema Neg Hx   . Asthma Neg Hx   . Atopy Neg Hx   . Eczema Neg Hx   . Immunodeficiency Neg Hx   . Urticaria Neg Hx     Social History   Tobacco Use  . Smoking status: Former Smoker    Packs/day: 0.50    Years: 25.00    Pack years: 12.50    Types: Cigarettes    Quit date: 10/29/1972    Years since quitting: 47.5  . Smokeless tobacco: Never Used  Vaping Use  . Vaping Use: Never used  Substance Use Topics  . Alcohol use: No    Alcohol/week: 0.0 standard drinks  . Drug use: No    Home Medications Prior to Admission medications   Medication Sig Start Date End Date Taking? Authorizing Provider  acetaminophen (TYLENOL) 325 MG tablet Take 2 tablets (650 mg total) by mouth every 6 (six) hours as needed for mild pain (or Fever >/= 101). 02/19/14   Perkins, Alexzandrew L, PA-C  ALPRAZolam (XANAX) 0.25 MG tablet TAKE 1 TABLET BY MOUTH 2 TIMES DAILY AS NEEDED. 03/24/20   Biagio Borg, MD  amLODipine-benazepril (LOTREL) 10-40 MG capsule TAKE 1 CAPSULE BY MOUTH  DAILY 11/28/19   Biagio Borg, MD  budesonide-formoterol [facility]) 160-4.5 MCG/ACT inhaler Inhale 2 puffs into the lungs 2 (two) times daily. 01/27/20   Kozlow, Donnamarie Poag, MD  cloNIDine (CATAPRES - DOSED IN MG/24 HR) 0.1 mg/24hr patch Place 1 patch (0.1 mg total) onto the skin once a week. 04/08/20   Biagio Borg, MD  furosemide (LASIX) 20 MG tablet Take 1 tablet (20 mg total) by mouth every morning. 08/09/19   Biagio Borg, MD  gabapentin (NEURONTIN) 300 MG capsule TAKE 1 CAPSULE BY MOUTH 3  TIMES DAILY 02/12/20   Biagio Borg, MD  hydrALAZINE (APRESOLINE) 50 MG tablet Take 2 tablets (100 mg total) by mouth 3 (three) times daily. 05/03/20   Breck Coons, MD  lovastatin (MEVACOR) 40 MG tablet TAKE 1 TABLET BY MOUTH AT  BEDTIME 09/22/19   Biagio Borg, MD   nystatin (MYCOSTATIN) 100000 UNIT/ML suspension Swish and spit 38mL 2 times daily after using Symbicort. 03/16/20   Kozlow, Donnamarie Poag, MD  oxybutynin (DITROPAN) 5 MG tablet TAKE 1 TABLET BY MOUTH 3  TIMES DAILY 02/12/20   Biagio Borg, MD  pantoprazole (PROTONIX) 40 MG tablet TAKE 1 TABLET BY MOUTH  DAILY 02/12/20   Biagio Borg, MD  PROAIR HFA 108 [phone] Base) MCG/ACT inhaler TAKE 2 PUFFS BY MOUTH EVERY 6 HOURS AS NEEDED FOR WHEEZE OR SHORTNESS OF BREATH 10/28/18   Biagio Borg, MD  RESTASIS 0.05 % ophthalmic emulsion  10/01/19   [provider]  tiZANidine (ZANAFLEX) 4 MG tablet TAKE 1  TABLET (4 MG TOTAL) BY MOUTH EVERY 6 (SIX) HOURS AS NEEDED FOR MUSCLE SPASMS. 08/27/19   Biagio Borg, MD  traMADol (ULTRAM) 50 MG tablet TAKE 1-2 TABLETS (50-100 MG TOTAL) BY MOUTH EVERY 6 (SIX) HOURS AS NEEDED. FOR PAIN 03/11/20   Biagio Borg, MD  traZODone (DESYREL) 50 MG tablet TAKE 1/2 TO 1 TABLET BY MOUTH AT BEDTIME AS NEEDED FOR SLEEP 05/02/20   Biagio Borg, MD  lansoprazole (PREVACID) 30 MG capsule Take 1 capsule (30 mg total) by mouth daily. 04/18/11 07/13/11  Biagio Borg, MD    Allergies    Patient has no known allergies.  Review of Systems   Review of Systems  Constitutional: Negative for chills and fever.  HENT: Negative for congestion and rhinorrhea.   Respiratory: Negative for cough and shortness of breath.   Cardiovascular: Negative for chest pain and palpitations.  Gastrointestinal: Negative for abdominal pain, diarrhea, nausea and vomiting.  Genitourinary: Negative for decreased urine volume, difficulty urinating, dysuria and urgency.  Musculoskeletal: Negative for arthralgias and back pain.  Skin: Positive for color change (red hands). Negative for rash and wound.  Neurological: Positive for headaches (when hydralazine patch was on). Negative for light-headedness.    Physical Exam Updated Vital Signs BP (!) 182/77 (BP Location: Left Arm)   Pulse 90   Temp 98.9 F (37.2 C)  (Oral)   Resp 13   Ht 5\' 4"  (1.626 m)   Wt 89.8 kg   SpO2 98%   BMI 33.99 kg/m   Physical Exam Vitals and nursing note reviewed. Exam conducted with a chaperone present.  Constitutional:      General: She is not in acute distress.    Appearance: Normal appearance.  HENT:     Head: Normocephalic and atraumatic.     Nose: No rhinorrhea.  Eyes:     General:        Right eye: No discharge.        Left eye: No discharge.     Conjunctiva/sclera: Conjunctivae normal.  Cardiovascular:     Rate and Rhythm: Normal rate and regular rhythm.  Pulmonary:     Effort: Pulmonary effort is normal. No respiratory distress.     Breath sounds: No stridor.  Abdominal:     General: Abdomen is flat. There is no distension.     Palpations: Abdomen is soft.  Musculoskeletal:        General: No tenderness or signs of injury.     Right lower leg: Edema present.     Left lower leg: Edema present.  Skin:    General: Skin is warm and dry.  Neurological:     General: No focal deficit present.     Mental Status: She is alert. Mental status is at baseline.     Motor: No weakness.     Comments: Equal grip strength bilateral upper, equal flexion extension of the arms, sensation intact.  And lower extremities 5 out of 5 motor strength and sensation. No facial droop no slurred speech alert and oriented.  Psychiatric:        Mood and Affect: Mood normal.        Behavior: Behavior normal.     ED Results / Procedures / Treatments   Labs (all labs ordered are listed, but only abnormal results are displayed) Labs Reviewed  BASIC METABOLIC PANEL - Abnormal; Notable for the following components:      Result Value   Chloride 96 (*)  Glucose, Bld 104 (*)    GFR, Estimated 59 (*)    All other components within normal limits  ALDOSTERONE + RENIN ACTIVITY W/ RATIO    EKG None  Radiology US Renal  Result Date: 05/03/2020 CLINICAL DATA:  uncontrolled HTN EXAM: RENAL / URINARY TRACT ULTRASOUND COMPLETE  COMPARISON:  02/01/2010 and prior. FINDINGS: Right Kidney: Renal measurements: 8.7 x 4.1 x 4.1 cm = volume: 75.1 mL. Increased parenchymal echogenicity. No hydronephrosis visualized. 1.7 cm interpole cyst. Left Kidney: Renal measurements: 9.3 x 4.4 x 3.8 cm = volume: 80.7 mL. Increased parenchymal echogenicity. No hydronephrosis visualized. 6 mm echogenic interpole focus demonstrating twinkle artifact. 1.1 cm interpole cyst. Bladder: Appears normal for degree of bladder distention. Bilateral ureteral jets visualized. Other: None. IMPRESSION: Echogenic renal parenchyma may reflect sequela of medical renal disease. Bilateral renal cysts.  Nonobstructive left nephrolithiasis. Electronically Signed   By: Primitivo Gauze M.D.   On: 05/03/2020 15:09    Procedures Procedures (including critical care time)  Medications Ordered in ED Medications  hydrALAZINE (APRESOLINE) tablet 50 mg (50 mg Oral Given 05/03/20 1327)    ED Course  I have reviewed the triage vital signs and the nursing notes.  Pertinent labs & imaging results that were available during my care of the patient were reviewed by me and considered in my medical decision making (see chart for details).    MDM Rules/Calculators/A&P                          Patient with chronic hypertension poorly controlled on multiple medications comes back today because numbers are still elevated.  She feels much better symptomatic wise now that she is remove the clonidine patch.  She feels that has made her symptoms worse.  Her vital signs show hypertension 190s over 80s here.  She is resting comfortably no increased work of breathing clear lungs.  Normal heart sounds.  Normal mental status no focal neurologic deficits.  Patient has CT scan done yesterday that was unremarkable.  She had labs done as well.  We will repeat labs.  If labs are unremarkable we will consult with nephrology to come up with an outpatient plan to help manage her hypertension  better.  Kidney function unchanged from prior.  I spoke nephrology on-call.  They recommend increasing hydralazine, to 100 mg 3 times daily.  Recommend renal ultrasound and further blood testing to help them as an outpatient they will set follow-up.  They agree with discharge if there is no significant abnormalities outpatient blood pressure control.  Strict return precautions discussed.  I discussed this with the family they agree.  Pt care was handed off to on coming provider at 1500.  Complete history and physical and current plan have been communicated.  Please refer to their note for the remainder of ED care and ultimate disposition.  Pt seen in conjunction with Dr. Reather Converse    Final Clinical Impression(s) / ED Diagnoses Final diagnoses:  Uncontrolled hypertension    Rx / DC Orders ED Discharge Orders         Ordered    hydrALAZINE (APRESOLINE) 50 MG tablet  3 times daily        05/03/20 1436           Breck Coons, MD 05/04/20 [phone]

## 2020-05-03 NOTE — Telephone Encounter (Signed)
Tried calling pts daughter no answer or vm to leave Dr. Raphael Gibney advice.

## 2020-05-03 NOTE — Telephone Encounter (Signed)
Sent to Dr. John. 

## 2020-05-03 NOTE — Discharge Instructions (Signed)
Start taking her hydralazine 100 mg 3 times daily instead of 50.  A new prescription is sent in case you do not have enough medication for this.  The [place] [facility] should call you tomorrow morning if they do not phone numbers provided you can call them.

## 2020-05-03 NOTE — Telephone Encounter (Signed)
Ok to take an extra 50 mg hydralazine pill now while waiting, and let the provider know this when they are seen

## 2020-05-04 ENCOUNTER — Telehealth: Payer: Self-pay | Admitting: Internal Medicine

## 2020-05-04 ENCOUNTER — Ambulatory Visit: Payer: Medicare Other | Admitting: Family

## 2020-05-04 DIAGNOSIS — I10 Essential (primary) hypertension: Secondary | ICD-10-CM

## 2020-05-04 DIAGNOSIS — N1831 Chronic kidney disease, stage 3a: Secondary | ICD-10-CM

## 2020-05-04 DIAGNOSIS — Z0289 Encounter for other administrative examinations: Secondary | ICD-10-CM

## 2020-05-04 NOTE — Telephone Encounter (Signed)
Patients daughter calling, states patient was seen at the hospital yesterday and they referred her to a kidney specialist but they referred her to the wrong place ( [facility]) and they are wondering if Dr. Jonny Ruiz can refer the patient to [facility] in [place]. Daughter would like a call with whatever the outcome is [phone]

## 2020-05-05 ENCOUNTER — Telehealth: Payer: Self-pay | Admitting: *Deleted

## 2020-05-05 NOTE — Telephone Encounter (Signed)
Ok this is done 

## 2020-05-05 NOTE — Telephone Encounter (Signed)
Sent to Dr. John to advise. 

## 2020-05-05 NOTE — Telephone Encounter (Signed)
TOC CM received call from pt's dtr Durward Mallard, # [phone] stating she called [facility] in [place] and they do not follow Avis pt's. She wants referral sent to [facility] in [place]. Explained pt's PCP has sent referral to [facility], provided her with number to follow up and schedule appt. Isidoro Donning RN CCM, [facility] ED TOC CM [phone]

## 2020-05-06 ENCOUNTER — Encounter: Payer: Self-pay | Admitting: Internal Medicine

## 2020-05-09 LAB — ALDOSTERONE + RENIN ACTIVITY W/ RATIO
ALDO / PRA Ratio: 14.6 (ref 0.0–30.0)
Aldosterone: 4.9 ng/dL (ref 0.0–30.0)
PRA LC/MS/MS: 0.335 ng/mL/hr (ref 0.167–5.380)

## 2020-05-10 ENCOUNTER — Telehealth: Payer: Self-pay | Admitting: Internal Medicine

## 2020-05-10 NOTE — Telephone Encounter (Signed)
[facility]   Caller states she was seen in the hospital for elevated blood pressure recently . It is reading 200/79 today. They did detect kidney abnormalities that she is being referred out for  [facility] advised: See PCP within 4 hours  Patient is already scheduled for 1.11.2022 to discuss her BP.   Please advise whether she can keep this appointment or if she needs to come in sooner.

## 2020-05-11 ENCOUNTER — Other Ambulatory Visit: Payer: Self-pay

## 2020-05-11 ENCOUNTER — Ambulatory Visit (INDEPENDENT_AMBULATORY_CARE_PROVIDER_SITE_OTHER): Payer: Medicare Other | Admitting: Internal Medicine

## 2020-05-11 ENCOUNTER — Encounter: Payer: Self-pay | Admitting: Internal Medicine

## 2020-05-11 VITALS — BP 178/76 | HR 95 | Temp 99.1°F | Ht 64.0 in | Wt 196.6 lb

## 2020-05-11 DIAGNOSIS — R350 Frequency of micturition: Secondary | ICD-10-CM

## 2020-05-11 DIAGNOSIS — I5032 Chronic diastolic (congestive) heart failure: Secondary | ICD-10-CM | POA: Diagnosis not present

## 2020-05-11 DIAGNOSIS — E785 Hyperlipidemia, unspecified: Secondary | ICD-10-CM | POA: Diagnosis not present

## 2020-05-11 DIAGNOSIS — J449 Chronic obstructive pulmonary disease, unspecified: Secondary | ICD-10-CM | POA: Diagnosis not present

## 2020-05-11 DIAGNOSIS — F419 Anxiety disorder, unspecified: Secondary | ICD-10-CM

## 2020-05-11 DIAGNOSIS — I10 Essential (primary) hypertension: Secondary | ICD-10-CM

## 2020-05-11 LAB — URINALYSIS, ROUTINE W REFLEX MICROSCOPIC
Bilirubin Urine: NEGATIVE
Hgb urine dipstick: NEGATIVE
Ketones, ur: NEGATIVE
Nitrite: NEGATIVE
RBC / HPF: NONE SEEN (ref 0–?)
Specific Gravity, Urine: <=1.005 — AB (ref 1.000–1.030)
Total Protein, Urine: NEGATIVE
Urine Glucose: NEGATIVE
Urobilinogen, UA: 0.2 (ref 0.0–1.0)
pH: 7 (ref 5.0–8.0)

## 2020-05-11 MED ORDER — AMLODIPINE BESY-BENAZEPRIL HCL 10-40 MG PO CAPS: 1.0000 | ORAL_CAPSULE | Freq: Every day | ORAL | 3 refills | Status: DC

## 2020-05-11 MED ORDER — CITALOPRAM HYDROBROMIDE 10 MG PO TABS: 10.0000 mg | ORAL_TABLET | Freq: Every day | ORAL | 3 refills | Status: DC

## 2020-05-11 NOTE — Progress Notes (Signed)
Established Patient Office Visit  Subjective:  Patient ID: Wendy Torres, female    DOB: June 10, 1940  Age: 80 y.o. MRN: [id number]  CC:  Chief Complaint  Patient presents with  . Hypertension    Pt states her BP has been running high.. up in 200's. Went to ER twice for blood pressure             HPI:  Wendy Torres is a 80 y.o. female here for wellness exam and f/u BP, has been in ED multiple times and all have listed lotrel, but is NOT taking this due to some confusion over meds.  Here with family and have now realized this.  Pt denies chest pain, increased sob or doe, wheezing, orthopnea, PND, increased LE swelling, palpitations, dizziness or syncope.  Pt denies new neurological symptoms such as new headache, or facial or extremity weakness or numbness   Pt denies polydipsia, polyuria,  Denies worsening depressive symptoms, suicidal ideation, or panic; has ongoing anxiety, increased recently and seems to make her cognitive ability worse.  Also c/o 2-3 days onset incidental urinary frequency but Denies urinary symptoms such as dysuria,  urgency, flank pain, hematuria or n/v, fever, chills.  Wt Readings from Last 3 Encounters:  05/11/20 196 lb 9.6 oz (89.2 kg)  05/02/20 198 lb (89.8 kg)  04/07/20 198 lb (89.8 kg)   BP Readings from Last 3 Encounters:  05/11/20 (!) 178/76  05/03/20 (!) 182/77  05/02/20 (!) 150/80         Past Medical History:  Diagnosis Date  . ABNORMAL CHEST XRAY 11/05/2008  . ACUTE URIS OF UNSPECIFIED SITE 10/22/2007  . ALLERGIC RHINITIS 12/09/2006  . ANEMIA 01/14/2009  . Anxiety   . Arthritis   . ASTHMA 12/09/2006  . Asthma   . Blood transfusion without reported diagnosis   . Cataract    bilateral -removed  . CHF (congestive heart failure) ([place])   . Chronic pain syndrome 02/16/2009  . COPD 12/09/2006  . DEGENERATIVE JOINT DISEASE 12/09/2006  . DIZZINESS 03/30/2010  . DYSPNEA 01/27/2010   with heavy exertion  . FREQUENCY, URINARY 03/30/2010  . GERD 12/09/2006   . Glossitis 01/14/2009  . HELICOBACTER PYLORI GASTRITIS, HX OF 12/09/2006  . HIATAL HERNIA 03/01/2010  . History of hiatal hernia   . HYPERLIPIDEMIA 04/18/2007  . HYPERSOMNIA 08/31/2008  . HYPERTENSION 12/09/2006  . INSOMNIA-SLEEP DISORDER-UNSPEC 11/05/2008  . LOW BACK PAIN 12/09/2006  . Morbid obesity ([place]) 12/09/2006  . Nonspecific (abnormal) findings on radiological and other examination of body structure 11/05/2008  . OSTEOPOROSIS 12/09/2006  . OVERACTIVE BLADDER 04/14/2008  . PALPITATIONS, RECURRENT 01/27/2010  . Stroke [facility])    pt. stated light one years ago  . SYNCOPE 11/05/2008  . TRANSIENT ISCHEMIC ATTACK, HX OF 12/09/2006   Past Surgical History:  Procedure Laterality Date  . COLONOSCOPY    . LUMBAR DISC SURGERY    . TONSILLECTOMY     as a child  . TOTAL HIP ARTHROPLASTY Right 11/06/2012   Procedure: RIGHT TOTAL HIP ARTHROPLASTY WITH ACETABULAR AUTOGRAFT;  Surgeon: Gearlean Alf, MD;  Location: [facility] ORS;  Service: Orthopedics;  Laterality: Right;  . TOTAL HIP ARTHROPLASTY Left 02/18/2014   Procedure: LEFT TOTAL HIP ARTHROPLASTY;  Surgeon: Gearlean Alf, MD;  Location: [facility] OR[facility];  Service: Orthopedics;  Laterality: Left;  . TUBAL LIGATION      reports that she quit smoking about 47 years ago. Her smoking use included cigarettes. She has a 12.50 pack-year smoking history.  She has never used smokeless tobacco. She reports that she does not drink alcohol and does not use drugs. family history includes Heart disease in her mother; Hypertension in her brother; Lung cancer in her father. No Known Allergies Current Outpatient Medications on File Prior to Visit  Medication Sig Dispense Refill  . acetaminophen (TYLENOL) 325 MG tablet Take 2 tablets (650 mg total) by mouth every 6 (six) hours as needed for mild pain (or Fever >/= 101). 40 tablet 0  . ALPRAZolam (XANAX) 0.25 MG tablet TAKE 1 TABLET BY MOUTH 2 TIMES DAILY AS NEEDED. 60 tablet 2  . budesonide-formoterol (SYMBICORT) 160-4.5 MCG/ACT  inhaler Inhale 2 puffs into the lungs 2 (two) times daily. 1 each 5  . cloNIDine (CATAPRES - DOSED IN MG/24 HR) 0.1 mg/24hr patch Place 1 patch (0.1 mg total) onto the skin once a week. 12 patch 3  . furosemide (LASIX) 20 MG tablet Take 1 tablet (20 mg total) by mouth every morning. 90 tablet 3  . gabapentin (NEURONTIN) 300 MG capsule TAKE 1 CAPSULE BY MOUTH 3  TIMES DAILY 270 capsule 1  . hydrALAZINE (APRESOLINE) 50 MG tablet Take 2 tablets (100 mg total) by mouth 3 (three) times daily. 270 tablet 3  . lovastatin (MEVACOR) 40 MG tablet TAKE 1 TABLET BY MOUTH AT  BEDTIME 90 tablet 3  . nystatin (MYCOSTATIN) 100000 UNIT/ML suspension Swish and spit 19mL 2 times daily after using Symbicort. 300 mL 5  . oxybutynin (DITROPAN) 5 MG tablet TAKE 1 TABLET BY MOUTH 3  TIMES DAILY 270 tablet 3  . pantoprazole (PROTONIX) 40 MG tablet TAKE 1 TABLET BY MOUTH  DAILY 90 tablet 3  . PROAIR HFA 108 (90 Base) MCG/ACT inhaler TAKE 2 PUFFS BY MOUTH EVERY 6 HOURS AS NEEDED FOR WHEEZE OR SHORTNESS OF BREATH 18 g 2  . tiZANidine (ZANAFLEX) 4 MG tablet TAKE 1 TABLET (4 MG TOTAL) BY MOUTH EVERY 6 (SIX) HOURS AS NEEDED FOR MUSCLE SPASMS. 50 tablet 1  . traMADol (ULTRAM) 50 MG tablet TAKE 1-2 TABLETS (50-100 MG TOTAL) BY MOUTH EVERY 6 (SIX) HOURS AS NEEDED. FOR PAIN 120 tablet 2  . traZODone (DESYREL) 50 MG tablet TAKE 1/2 TO 1 TABLET BY MOUTH AT BEDTIME AS NEEDED FOR SLEEP 90 tablet 1  . [DISCONTINUED] lansoprazole (PREVACID) 30 MG capsule Take 1 capsule (30 mg total) by mouth daily. 90 capsule 3   No current facility-administered medications on file prior to visit.        ROS:  All others reviewed and negative.  Objective        PE:  BP (!) 178/76 (BP Location: Left Arm)   Pulse 95   Temp 99.1 F (37.3 C) (Oral)   Ht 5\' 4"  (1.626 m)   Wt 196 lb 9.6 oz (89.2 kg)   SpO2 94% Comment: 2 liter oxygen  BMI 33.75 kg/m                 Constitutional: Pt appears in NAD               HENT: Head: NCAT.                 Right Ear: External ear normal.                 Left Ear: External ear normal.                Eyes: . Pupils are equal, round, and reactive to light. Conjunctivae and  EOM are normal               Nose: without d/c or deformity               Neck: Neck supple. Gross normal ROM               Cardiovascular: Normal rate and regular rhythm.                 Pulmonary/Chest: Effort normal and breath sounds without rales or wheezing.                Abd:  Soft, NT, ND, + BS, no organomegaly               Neurological: Pt is alert. At baseline orientation, motor grossly intact               Skin: Skin is warm. No rashes, no other new lesions, LE edema - none               Psychiatric: Pt behavior is normal without agitation   Assessment/Plan:  LTANYA BAYLEY is a 80 y.o. Black or African American [2] female with  has a past medical history of ABNORMAL CHEST XRAY (11/05/2008), ACUTE URIS OF UNSPECIFIED SITE (10/22/2007), ALLERGIC RHINITIS (12/09/2006), ANEMIA (01/14/2009), Anxiety, Arthritis, ASTHMA (12/09/2006), Asthma, Blood transfusion without reported diagnosis, Cataract, CHF (congestive heart failure) ([place]), Chronic pain syndrome (02/16/2009), COPD (12/09/2006), DEGENERATIVE JOINT DISEASE (12/09/2006), DIZZINESS (03/30/2010), DYSPNEA (01/27/2010), FREQUENCY, URINARY (03/30/2010), GERD (12/09/2006), Glossitis (0/93/2355), HELICOBACTER PYLORI GASTRITIS, HX OF (12/09/2006), HIATAL HERNIA (03/01/2010), History of hiatal hernia, HYPERLIPIDEMIA (04/18/2007), HYPERSOMNIA (08/31/2008), HYPERTENSION (12/09/2006), INSOMNIA-SLEEP DISORDER-UNSPEC (11/05/2008), LOW BACK PAIN (12/09/2006), Morbid obesity ([place]) (12/09/2006), Nonspecific (abnormal) findings on radiological and other examination of body structure (11/05/2008), OSTEOPOROSIS (12/09/2006), OVERACTIVE BLADDER (04/14/2008), PALPITATIONS, RECURRENT (01/27/2010), Stroke ([place]), SYNCOPE (11/05/2008), and TRANSIENT ISCHEMIC ATTACK, HX OF (12/09/2006).    Assessment Plan  See notes Labs  reviewed for each problem: Lab Results  Component Value Date   WBC 6.4 05/01/2020   HGB 12.2 05/01/2020   HCT 38.0 05/01/2020   PLT 313 05/01/2020   GLUCOSE 104 (H) 05/03/2020   CHOL 134 10/15/2019   TRIG 78.0 10/15/2019   HDL 55.50 10/15/2019   LDLCALC 63 10/15/2019   ALT 8 10/15/2019   AST 14 10/15/2019   NA 136 05/03/2020   K 4.1 05/03/2020   CL 96 (L) 05/03/2020   CREATININE 0.97 05/03/2020   BUN 10 05/03/2020   CO2 30 05/03/2020   TSH 1.20 10/15/2019   INR 1.02 02/10/2014   HGBA1C 6.1 10/15/2019    Micro: none  Cardiac tracings I have personally interpreted today:  none  Pertinent Radiological findings (summarize): none    Health Maintenance Due  Topic Date Due  . COVID-19 Vaccine (3 - Booster for Pfizer series) 04/12/2020    There are no preventive care reminders to display for this patient.  Lab Results  Component Value Date   TSH 1.20 10/15/2019   Lab Results  Component Value Date   WBC 6.4 05/01/2020   HGB 12.2 05/01/2020   HCT 38.0 05/01/2020   MCV 85.4 05/01/2020   PLT 313 05/01/2020   Lab Results  Component Value Date   NA 136 05/03/2020   K 4.1 05/03/2020   CO2 30 05/03/2020   GLUCOSE 104 (H) 05/03/2020   BUN 10 05/03/2020   CREATININE 0.97 05/03/2020   BILITOT 0.4 10/15/2019   ALKPHOS 73 10/15/2019   AST 14 10/15/2019  ALT 8 10/15/2019   PROT 7.6 03/16/2020   ALBUMIN 3.9 10/15/2019   CALCIUM 10.2 05/03/2020   ANIONGAP 10 05/03/2020   GFR 65.95 10/15/2019   Lab Results  Component Value Date   CHOL 134 10/15/2019   Lab Results  Component Value Date   HDL 55.50 10/15/2019   Lab Results  Component Value Date   LDLCALC 63 10/15/2019   Lab Results  Component Value Date   TRIG 78.0 10/15/2019   Lab Results  Component Value Date   CHOLHDL 2 10/15/2019   Lab Results  Component Value Date   HGBA1C 6.1 10/15/2019      Assessment & Plan:   Problem List Items Addressed This Visit      High   COPD with chronic  bronchitis and emphysema ([place])    Stable, cont med tx - albuterol hfa prn       Congestive heart failure (CHF) ([facility])    Volume stable today,  to f/u any worsening symptoms or concerns      Relevant Medications   amLODipine-benazepril (LOTREL) 10-40 MG capsule   Anxiety    Also for celexa 10 qd      Relevant Medications   citalopram (CELEXA) 10 MG tablet     Medium   Urinary frequency    Mild, for urine studies, r/o uti      Relevant Orders   Urinalysis, Routine w reflex microscopic (Completed)   Urine Culture (Completed)   Essential hypertension - Primary    BP Readings from Last 3 Encounters:  05/11/20 (!) 178/76  05/03/20 (!) 182/77  05/02/20 (!) 150/80  uncontrolled, but not taking the lotrel; ok to restart and decrease the hydralazine to 50 mg, check BP at home and next visit    Current Outpatient Medications (Cardiovascular):  .  cloNIDine (CATAPRES - DOSED IN MG/24 HR) 0.1 mg/24hr patch, Place 1 patch (0.1 mg total) onto the skin once a week. .  furosemide (LASIX) 20 MG tablet, Take 1 tablet (20 mg total) by mouth every morning. .  hydrALAZINE (APRESOLINE) 50 MG tablet, Take 2 tablets (100 mg total) by mouth 3 (three) times daily. Marland Kitchen  lovastatin (MEVACOR) 40 MG tablet, TAKE 1 TABLET BY MOUTH AT  BEDTIME .  amLODipine-benazepril (LOTREL) 10-40 MG capsule, Take 1 capsule by mouth daily.  Current Outpatient Medications (Respiratory):  .  budesonide-formoterol (SYMBICORT) 160-4.5 MCG/ACT inhaler, Inhale 2 puffs into the lungs 2 (two) times daily. Marland Kitchen  PROAIR HFA 108 (90 Base) MCG/ACT inhaler, TAKE 2 PUFFS BY MOUTH EVERY 6 HOURS AS NEEDED FOR WHEEZE OR SHORTNESS OF BREATH  Current Outpatient Medications (Analgesics):  .  acetaminophen (TYLENOL) 325 MG tablet, Take 2 tablets (650 mg total) by mouth every 6 (six) hours as needed for mild pain (or Fever >/= 101). .  traMADol (ULTRAM) 50 MG tablet, TAKE 1-2 TABLETS (50-100 MG TOTAL) BY MOUTH EVERY 6 (SIX) HOURS AS NEEDED.  FOR PAIN   Current Outpatient Medications (Other):  Marland Kitchen  ALPRAZolam (XANAX) 0.25 MG tablet, TAKE 1 TABLET BY MOUTH 2 TIMES DAILY AS NEEDED. .  citalopram (CELEXA) 10 MG tablet, Take 1 tablet (10 mg total) by mouth daily. Marland Kitchen  gabapentin (NEURONTIN) 300 MG capsule, TAKE 1 CAPSULE BY MOUTH 3  TIMES DAILY .  nystatin (MYCOSTATIN) 100000 UNIT/ML suspension, Swish and spit 11mL 2 times daily after using Symbicort. Marland Kitchen  oxybutynin (DITROPAN) 5 MG tablet, TAKE 1 TABLET BY MOUTH 3  TIMES DAILY .  pantoprazole (PROTONIX) 40 MG tablet,  TAKE 1 TABLET BY MOUTH  DAILY .  tiZANidine (ZANAFLEX) 4 MG tablet, TAKE 1 TABLET (4 MG TOTAL) BY MOUTH EVERY 6 (SIX) HOURS AS NEEDED FOR MUSCLE SPASMS. Marland Kitchen  traZODone (DESYREL) 50 MG tablet, TAKE 1/2 TO 1 TABLET BY MOUTH AT BEDTIME AS NEEDED FOR SLEEP       Relevant Medications   amLODipine-benazepril (LOTREL) 10-40 MG capsule   Dyslipidemia, goal LDL below 130    Lab Results  Component Value Date   LDLCALC 63 10/15/2019   Stable, pt to continue current statin  lipitor      Relevant Medications   amLODipine-benazepril (LOTREL) 10-40 MG capsule      Meds ordered this encounter  Medications  . amLODipine-benazepril (LOTREL) 10-40 MG capsule    Sig: Take 1 capsule by mouth daily.    Dispense:  90 capsule    Refill:  3    Requesting 1 year supply  . citalopram (CELEXA) 10 MG tablet    Sig: Take 1 tablet (10 mg total) by mouth daily.    Dispense:  90 tablet    Refill:  3    Follow-up: Return in about 4 weeks (around 06/08/2020).    Cathlean Cower, MD 05/11/2020 10:58 AM [facility]

## 2020-05-11 NOTE — Patient Instructions (Signed)
Ok to ReStart the lotrel medication  OK to decrease the hydralazine again to 50 mg three times per day  Please continue all other medications as before, and refills have been done if requested.  Please have the pharmacy call with any other refills you may need.  Please continue your efforts at being more active, low cholesterol diet, and weight control.  Please keep your appointments with your specialists as you may have planned - the kidney doctor referral  Please go to the LAB at the blood drawing area for the tests to be done - JUST the urine testing today  You will be contacted by phone if any changes need to be made immediately.  Otherwise, you will receive a letter about your results with an explanation, but please check with MyChart first.  Please remember to sign up for MyChart if you have not done so, as this will be important to you in the future with finding out test results, communicating by private email, and scheduling acute appointments online when needed.  Please make an Appointment to return in 1 month

## 2020-05-11 NOTE — Telephone Encounter (Signed)
Patient seen today.  Dm/cma

## 2020-05-12 ENCOUNTER — Encounter: Payer: Self-pay | Admitting: Internal Medicine

## 2020-05-12 LAB — URINE CULTURE

## 2020-05-16 ENCOUNTER — Encounter: Payer: Self-pay | Admitting: Internal Medicine

## 2020-05-16 DIAGNOSIS — F419 Anxiety disorder, unspecified: Secondary | ICD-10-CM | POA: Insufficient documentation

## 2020-05-16 DIAGNOSIS — R350 Frequency of micturition: Secondary | ICD-10-CM | POA: Insufficient documentation

## 2020-05-16 NOTE — Assessment & Plan Note (Signed)
Volume stable today,  to f/u any worsening symptoms or concerns

## 2020-05-16 NOTE — Assessment & Plan Note (Signed)
Also for celexa 10 qd 

## 2020-05-16 NOTE — Assessment & Plan Note (Signed)
BP Readings from Last 3 Encounters:  05/11/20 (!) 178/76  05/03/20 (!) 182/77  05/02/20 (!) 150/80  uncontrolled, but not taking the lotrel; ok to restart and decrease the hydralazine to 50 mg, check BP at home and next visit    Current Outpatient Medications (Cardiovascular):  .  cloNIDine (CATAPRES - DOSED IN MG/24 HR) 0.1 mg/24hr patch, Place 1 patch (0.1 mg total) onto the skin once a week. .  furosemide (LASIX) 20 MG tablet, Take 1 tablet (20 mg total) by mouth every morning. .  hydrALAZINE (APRESOLINE) 50 MG tablet, Take 2 tablets (100 mg total) by mouth 3 (three) times daily. Marland Kitchen  lovastatin (MEVACOR) 40 MG tablet, TAKE 1 TABLET BY MOUTH AT  BEDTIME .  amLODipine-benazepril (LOTREL) 10-40 MG capsule, Take 1 capsule by mouth daily.  Current Outpatient Medications (Respiratory):  .  budesonide-formoterol (SYMBICORT) 160-4.5 MCG/ACT inhaler, Inhale 2 puffs into the lungs 2 (two) times daily. Marland Kitchen  PROAIR HFA 108 (90 Base) MCG/ACT inhaler, TAKE 2 PUFFS BY MOUTH EVERY 6 HOURS AS NEEDED FOR WHEEZE OR SHORTNESS OF BREATH  Current Outpatient Medications (Analgesics):  .  acetaminophen (TYLENOL) 325 MG tablet, Take 2 tablets (650 mg total) by mouth every 6 (six) hours as needed for mild pain (or Fever >/= 101). .  traMADol (ULTRAM) 50 MG tablet, TAKE 1-2 TABLETS (50-100 MG TOTAL) BY MOUTH EVERY 6 (SIX) HOURS AS NEEDED. FOR PAIN   Current Outpatient Medications (Other):  Marland Kitchen  ALPRAZolam (XANAX) 0.25 MG tablet, TAKE 1 TABLET BY MOUTH 2 TIMES DAILY AS NEEDED. .  citalopram (CELEXA) 10 MG tablet, Take 1 tablet (10 mg total) by mouth daily. Marland Kitchen  gabapentin (NEURONTIN) 300 MG capsule, TAKE 1 CAPSULE BY MOUTH 3  TIMES DAILY .  nystatin (MYCOSTATIN) 100000 UNIT/ML suspension, Swish and spit 79mL 2 times daily after using Symbicort. Marland Kitchen  oxybutynin (DITROPAN) 5 MG tablet, TAKE 1 TABLET BY MOUTH 3  TIMES DAILY .  pantoprazole (PROTONIX) 40 MG tablet, TAKE 1 TABLET BY MOUTH  DAILY .  tiZANidine (ZANAFLEX) 4 MG  tablet, TAKE 1 TABLET (4 MG TOTAL) BY MOUTH EVERY 6 (SIX) HOURS AS NEEDED FOR MUSCLE SPASMS. Marland Kitchen  traZODone (DESYREL) 50 MG tablet, TAKE 1/2 TO 1 TABLET BY MOUTH AT BEDTIME AS NEEDED FOR SLEEP

## 2020-05-16 NOTE — Assessment & Plan Note (Signed)
Stable, cont med tx - albuterol hfa prn

## 2020-05-16 NOTE — Assessment & Plan Note (Signed)
Mild, for urine studies, r/o uti

## 2020-05-16 NOTE — Assessment & Plan Note (Signed)
Lab Results  Component Value Date   LDLCALC 63 10/15/2019   Stable, pt to continue current statin  lipitor

## 2020-05-25 DIAGNOSIS — I10 Essential (primary) hypertension: Secondary | ICD-10-CM | POA: Diagnosis not present

## 2020-05-25 DIAGNOSIS — E785 Hyperlipidemia, unspecified: Secondary | ICD-10-CM | POA: Diagnosis not present

## 2020-05-26 ENCOUNTER — Other Ambulatory Visit: Payer: Self-pay | Admitting: Nephrology

## 2020-05-26 DIAGNOSIS — I10 Essential (primary) hypertension: Secondary | ICD-10-CM

## 2020-05-26 DIAGNOSIS — E785 Hyperlipidemia, unspecified: Secondary | ICD-10-CM

## 2020-05-30 ENCOUNTER — Other Ambulatory Visit: Payer: Self-pay | Admitting: Internal Medicine

## 2020-06-01 DIAGNOSIS — Z96642 Presence of left artificial hip joint: Secondary | ICD-10-CM | POA: Diagnosis not present

## 2020-06-01 DIAGNOSIS — M6281 Muscle weakness (generalized): Secondary | ICD-10-CM | POA: Diagnosis not present

## 2020-06-01 DIAGNOSIS — Z471 Aftercare following joint replacement surgery: Secondary | ICD-10-CM | POA: Diagnosis not present

## 2020-06-01 DIAGNOSIS — R269 Unspecified abnormalities of gait and mobility: Secondary | ICD-10-CM | POA: Diagnosis not present

## 2020-06-07 ENCOUNTER — Other Ambulatory Visit: Payer: Self-pay | Admitting: Nephrology

## 2020-06-07 ENCOUNTER — Ambulatory Visit
Admission: RE | Admit: 2020-06-07 | Discharge: 2020-06-07 | Disposition: A | Discharge status: Home / Self Care | Payer: Medicare Other | Source: Ambulatory Visit | Attending: Nephrology | Admitting: Nephrology

## 2020-06-07 DIAGNOSIS — I10 Essential (primary) hypertension: Secondary | ICD-10-CM | POA: Diagnosis not present

## 2020-06-07 DIAGNOSIS — E785 Hyperlipidemia, unspecified: Secondary | ICD-10-CM

## 2020-06-07 DIAGNOSIS — N200 Calculus of kidney: Secondary | ICD-10-CM | POA: Diagnosis not present

## 2020-06-10 DIAGNOSIS — I10 Essential (primary) hypertension: Secondary | ICD-10-CM | POA: Diagnosis not present

## 2020-06-11 ENCOUNTER — Ambulatory Visit: Payer: Medicare Other | Admitting: Internal Medicine

## 2020-06-15 ENCOUNTER — Encounter: Payer: Self-pay | Admitting: Allergy and Immunology

## 2020-06-15 ENCOUNTER — Telehealth: Payer: Self-pay | Admitting: *Deleted

## 2020-06-15 ENCOUNTER — Ambulatory Visit (INDEPENDENT_AMBULATORY_CARE_PROVIDER_SITE_OTHER): Payer: Medicare Other | Admitting: Allergy and Immunology

## 2020-06-15 ENCOUNTER — Other Ambulatory Visit: Payer: Self-pay

## 2020-06-15 VITALS — BP 148/64 | HR 73 | Temp 98.3°F | Resp 18

## 2020-06-15 DIAGNOSIS — M3500 Sicca syndrome, unspecified: Secondary | ICD-10-CM | POA: Diagnosis not present

## 2020-06-15 DIAGNOSIS — J449 Chronic obstructive pulmonary disease, unspecified: Secondary | ICD-10-CM

## 2020-06-15 DIAGNOSIS — K140 Glossitis: Secondary | ICD-10-CM

## 2020-06-15 DIAGNOSIS — R040 Epistaxis: Secondary | ICD-10-CM | POA: Diagnosis not present

## 2020-06-15 NOTE — Progress Notes (Signed)
[facility] [place] [facility] [place]   Follow-up Note  Referring Provider: Biagio Borg, MD Primary Provider: Biagio Borg, MD Date of Office Visit: 06/15/2020  Subjective:   Wendy Torres (DOB: Sep 12, 1940) is a 80 y.o. female who returns to the [facility] on 06/15/2020 in re-evaluation of the following:  HPI: Kaavya returns to this clinic in evaluation of glossitis and sicca syndrome and a history of COPD with asthma.  Her last visit to this clinic was 16 March 2020.  During her last visit we started her on nystatin swish and swallow to be used after Symbicort use.  She believes that this has helped her mouth significantly.  She now does not have an unpleasant sensation when she eats.  She also relates a history of developing some problems with left nasal bleeding.  She does have a bubble reservoir to use with her nasal cannula oxygen but she does not use this device.  She had no problems with her airway while using her Symbicort on a consistent basis.  It does not sound as though she has required a systemic steroid or an antibiotic for any type of airway issue and rarely uses a short acting bronchodilator.  Allergies as of 06/15/2020   No Known Allergies     Medication List      acetaminophen 325 MG tablet Commonly known as: TYLENOL Take 2 tablets (650 mg total) by mouth every 6 (six) hours as needed for mild pain (or Fever >/= 101).   ALPRAZolam 0.25 MG tablet Commonly known as: XANAX TAKE 1 TABLET BY MOUTH 2 TIMES DAILY AS NEEDED.   amLODipine-benazepril 10-40 MG capsule Commonly known as: LOTREL Take 1 capsule by mouth daily.   budesonide-formoterol 160-4.5 MCG/ACT inhaler Commonly known as: Symbicort Inhale 2 puffs into the lungs 2 (two) times daily.   CHLORTHALIDONE PO Take 0.5 tablets by mouth daily.   citalopram 10 MG tablet Commonly known as: CeleXA Take 1 tablet (10 mg total) by mouth daily.   cloNIDine 0.1  mg/24hr patch Commonly known as: CATAPRES - Dosed in mg/24 hr Place 1 patch (0.1 mg total) onto the skin once a week.   furosemide 20 MG tablet Commonly known as: LASIX Take 1 tablet (20 mg total) by mouth every morning.   gabapentin 300 MG capsule Commonly known as: NEURONTIN TAKE 1 CAPSULE BY MOUTH 3  TIMES DAILY   hydrALAZINE 50 MG tablet Commonly known as: APRESOLINE Take 2 tablets (100 mg total) by mouth 3 (three) times daily.   lovastatin 40 MG tablet Commonly known as: MEVACOR TAKE 1 TABLET BY MOUTH AT  BEDTIME   nystatin 100000 UNIT/ML suspension Commonly known as: MYCOSTATIN Swish and spit 27mL 2 times daily after using Symbicort.   oxybutynin 5 MG tablet Commonly known as: DITROPAN TAKE 1 TABLET BY MOUTH 3  TIMES DAILY   pantoprazole 40 MG tablet Commonly known as: PROTONIX TAKE 1 TABLET BY MOUTH  DAILY   ProAir HFA 108 (90 Base) MCG/ACT inhaler Generic drug: albuterol TAKE 2 PUFFS BY MOUTH EVERY 6 HOURS AS NEEDED FOR WHEEZE OR SHORTNESS OF BREATH   tiZANidine 4 MG tablet Commonly known as: ZANAFLEX TAKE 1 TABLET (4 MG TOTAL) BY MOUTH EVERY 6 (SIX) HOURS AS NEEDED FOR MUSCLE SPASMS.   traMADol 50 MG tablet Commonly known as: ULTRAM TAKE 1-2 TABLETS (50-100 MG TOTAL) BY MOUTH EVERY 6 (SIX) HOURS AS NEEDED. FOR PAIN   traZODone 50 MG tablet  Commonly known as: DESYREL TAKE 1/2 TO 1 TABLET BY MOUTH AT BEDTIME AS NEEDED FOR SLEEP       Past Medical History:  Diagnosis Date  . ABNORMAL CHEST XRAY 11/05/2008  . ACUTE URIS OF UNSPECIFIED SITE 10/22/2007  . ALLERGIC RHINITIS 12/09/2006  . ANEMIA 01/14/2009  . Anxiety   . Arthritis   . ASTHMA 12/09/2006  . Asthma   . Blood transfusion without reported diagnosis   . Cataract    bilateral -removed  . CHF (congestive heart failure) ([place])   . Chronic pain syndrome 02/16/2009  . COPD 12/09/2006  . DEGENERATIVE JOINT DISEASE 12/09/2006  . DIZZINESS 03/30/2010  . DYSPNEA 01/27/2010   with heavy exertion  .  FREQUENCY, URINARY 03/30/2010  . GERD 12/09/2006  . Glossitis 01/14/2009  . HELICOBACTER PYLORI GASTRITIS, HX OF 12/09/2006  . HIATAL HERNIA 03/01/2010  . History of hiatal hernia   . HYPERLIPIDEMIA 04/18/2007  . HYPERSOMNIA 08/31/2008  . HYPERTENSION 12/09/2006  . INSOMNIA-SLEEP DISORDER-UNSPEC 11/05/2008  . LOW BACK PAIN 12/09/2006  . Morbid obesity ([place]) 12/09/2006  . Nonspecific (abnormal) findings on radiological and other examination of body structure 11/05/2008  . OSTEOPOROSIS 12/09/2006  . OVERACTIVE BLADDER 04/14/2008  . PALPITATIONS, RECURRENT 01/27/2010  . Stroke [facility])    pt. stated light one years ago  . SYNCOPE 11/05/2008  . TRANSIENT ISCHEMIC ATTACK, HX OF 12/09/2006    Past Surgical History:  Procedure Laterality Date  . COLONOSCOPY    . LUMBAR DISC SURGERY    . TONSILLECTOMY     as a child  . TOTAL HIP ARTHROPLASTY Right 11/06/2012   Procedure: RIGHT TOTAL HIP ARTHROPLASTY WITH ACETABULAR AUTOGRAFT;  Surgeon: Gearlean Alf, MD;  Location: [facility];  Service: Orthopedics;  Laterality: Right;  . TOTAL HIP ARTHROPLASTY Left 02/18/2014   Procedure: LEFT TOTAL HIP ARTHROPLASTY;  Surgeon: Gearlean Alf, MD;  Location: [facility];  Service: Orthopedics;  Laterality: Left;  . TUBAL LIGATION      Review of systems negative except as noted in HPI / PMHx or noted below:  Review of Systems  Constitutional: Negative.   HENT: Negative.   Eyes: Negative.   Respiratory: Negative.   Cardiovascular: Negative.   Gastrointestinal: Negative.   Genitourinary: Negative.   Musculoskeletal: Negative.   Skin: Negative.   Neurological: Negative.   Endo/Heme/Allergies: Negative.   Psychiatric/Behavioral: Negative.      Objective:   Vitals:   06/15/20 1142  BP: (!) 148/64  Pulse: 73  Resp: 18  Temp: 98.3 F (36.8 C)  SpO2: 96%          Physical Exam HENT:     Nose: No mucosal edema (No obvious sites of bleeding or crusting or excoriation).     Mouth/Throat:     Tongue: Lesions  (Diffuse erythema and furrows.  Approximately 50% less from last exam) present.     Diagnostics:   The patient had an Asthma Control Test with the following results: ACT Total Score: 24.    Results of blood tests obtained 16 March 2020 identified a normal SPEP without evidence of an M spike.  Assessment and Plan:   1. Glossitis   2. Sicca syndrome ([place])   3. COPD with asthma ([place])   4. Epistaxis     1. Continue Symbicort 160 - 2 inhalations 2 times per day with spacer  2. Continue nystatin 5 mls swish and spit after 2 times a day after Symbicort  3. Continue Biotene mouth rinse daily  4. Remain away from antihistamine use  5.  Discuss with Dr. Jenny Reichmann about replacing Ditropan,  Hydralazine, Clonidine  6. Use distilled water or saline in bubble oxygen reservoir  7. Return to clinic in 1 year or earlier if needed  I think Vear is doing better on her current plan of using nystatin after Symbicort and using Biotene mouth rinses.  Her sicca syndrome is probably being somewhat exacerbated by the use of her Ditropan and hydralazine and clonidine and she can discuss this issue with Dr. Jenny Reichmann in more detail.  She has had some problems with irritation of her upper airway and she can try some moisturization in her bubble reservoir.  If she still continues to have some problems with bleeding then she needs to see an ENT doctor.  I will be happy to see her back in this clinic in 1 year but she can certainly have her medications refilled by her primary care doctor instead.  She will contact me should she have significant problems in the face of the therapy noted above.  Allena Katz, MD Allergy / Immunology [place]

## 2020-06-15 NOTE — Patient Instructions (Addendum)
  1. Continue Symbicort 160 - 2 inhalations 2 times per day with spacer  2. Continue nystatin 5 mls swish and spit after 2 times a day after Symbicort  3. Continue Biotene mouth rinse daily  4. Remain away from antihistamine use  5.  Discuss with Dr. Jenny Reichmann about replacing Ditropan,  Hydralazine, Clonidine  6. Use distilled water or saline in bubble oxygen reservoir  7. Return to clinic in 1 year or earlier if needed

## 2020-06-15 NOTE — Telephone Encounter (Signed)
Error

## 2020-06-16 ENCOUNTER — Encounter: Payer: Self-pay | Admitting: Allergy and Immunology

## 2020-06-16 DIAGNOSIS — H35373 Puckering of macula, bilateral: Secondary | ICD-10-CM | POA: Diagnosis not present

## 2020-06-16 DIAGNOSIS — H43813 Vitreous degeneration, bilateral: Secondary | ICD-10-CM | POA: Diagnosis not present

## 2020-06-16 DIAGNOSIS — H16223 Keratoconjunctivitis sicca, not specified as Sjogren's, bilateral: Secondary | ICD-10-CM | POA: Diagnosis not present

## 2020-06-16 DIAGNOSIS — H353131 Nonexudative age-related macular degeneration, bilateral, early dry stage: Secondary | ICD-10-CM | POA: Diagnosis not present

## 2020-06-21 ENCOUNTER — Encounter: Payer: Self-pay | Admitting: Internal Medicine

## 2020-06-21 ENCOUNTER — Ambulatory Visit (INDEPENDENT_AMBULATORY_CARE_PROVIDER_SITE_OTHER): Payer: Medicare Other | Admitting: Internal Medicine

## 2020-06-21 ENCOUNTER — Other Ambulatory Visit: Payer: Self-pay

## 2020-06-21 VITALS — BP 140/68 | HR 74 | Temp 98.5°F | Ht 64.0 in | Wt 195.0 lb

## 2020-06-21 DIAGNOSIS — R7303 Prediabetes: Secondary | ICD-10-CM

## 2020-06-21 DIAGNOSIS — M8949 Other hypertrophic osteoarthropathy, multiple sites: Secondary | ICD-10-CM | POA: Diagnosis not present

## 2020-06-21 DIAGNOSIS — E559 Vitamin D deficiency, unspecified: Secondary | ICD-10-CM

## 2020-06-21 DIAGNOSIS — Z0000 Encounter for general adult medical examination without abnormal findings: Secondary | ICD-10-CM | POA: Diagnosis not present

## 2020-06-21 DIAGNOSIS — M159 Polyosteoarthritis, unspecified: Secondary | ICD-10-CM

## 2020-06-21 LAB — CBC WITH DIFFERENTIAL/PLATELET
Basophils Absolute: 0.1 10*3/uL (ref 0.0–0.1)
Basophils Relative: 0.9 % (ref 0.0–3.0)
Eosinophils Absolute: 0.2 10*3/uL (ref 0.0–0.7)
Eosinophils Relative: 3 % (ref 0.0–5.0)
HCT: 36 % (ref 36.0–46.0)
Hemoglobin: 11.9 g/dL — ABNORMAL LOW (ref 12.0–15.0)
Lymphocytes Relative: 27.3 % (ref 12.0–46.0)
Lymphs Abs: 1.9 10*3/uL (ref 0.7–4.0)
MCHC: 33 g/dL (ref 30.0–36.0)
MCV: 83.3 fl (ref 78.0–100.0)
Monocytes Absolute: 0.7 10*3/uL (ref 0.1–1.0)
Monocytes Relative: 9.9 % (ref 3.0–12.0)
Neutro Abs: 4 10*3/uL (ref 1.4–7.7)
Neutrophils Relative %: 58.9 % (ref 43.0–77.0)
Platelets: 334 10*3/uL (ref 150.0–400.0)
RBC: 4.32 Mil/uL (ref 3.87–5.11)
RDW: 14.7 % (ref 11.5–15.5)
WBC: 6.8 10*3/uL (ref 4.0–10.5)

## 2020-06-21 LAB — BASIC METABOLIC PANEL
BUN: 10 mg/dL (ref 6–23)
CO2: 32 mEq/L (ref 19–32)
Calcium: 11.2 mg/dL — ABNORMAL HIGH (ref 8.4–10.5)
Chloride: 94 mEq/L — ABNORMAL LOW (ref 96–112)
Creatinine, Ser: 1.02 mg/dL (ref 0.40–1.20)
GFR: 51.63 mL/min — ABNORMAL LOW (ref >60.00)
Glucose, Bld: 85 mg/dL (ref 70–99)
Potassium: 4.2 mEq/L (ref 3.5–5.1)
Sodium: 134 mEq/L — ABNORMAL LOW (ref 135–145)

## 2020-06-21 LAB — URINALYSIS, ROUTINE W REFLEX MICROSCOPIC
Bilirubin Urine: NEGATIVE
Hgb urine dipstick: NEGATIVE
Ketones, ur: NEGATIVE
Leukocytes,Ua: NEGATIVE
Nitrite: NEGATIVE
RBC / HPF: NONE SEEN (ref 0–?)
Specific Gravity, Urine: <=1.005 — AB (ref 1.000–1.030)
Total Protein, Urine: NEGATIVE
Urine Glucose: NEGATIVE
Urobilinogen, UA: 0.2 (ref 0.0–1.0)
WBC, UA: NONE SEEN (ref 0–?)
pH: 6 (ref 5.0–8.0)

## 2020-06-21 LAB — LIPID PANEL
Cholesterol: 184 mg/dL (ref 0–200)
HDL: 73.8 mg/dL (ref >39.00)
LDL Cholesterol: 96 mg/dL (ref 0–99)
NonHDL: 109.93
Total CHOL/HDL Ratio: 2
Triglycerides: 70 mg/dL (ref 0.0–149.0)
VLDL: 14 mg/dL (ref 0.0–40.0)

## 2020-06-21 LAB — HEPATIC FUNCTION PANEL
ALT: 11 U/L (ref 0–35)
AST: 17 U/L (ref 0–37)
Albumin: 4.2 g/dL (ref 3.5–5.2)
Alkaline Phosphatase: 71 U/L (ref 39–117)
Bilirubin, Direct: 0.1 mg/dL (ref 0.0–0.3)
Total Bilirubin: 0.3 mg/dL (ref 0.2–1.2)
Total Protein: 8 g/dL (ref 6.0–8.3)

## 2020-06-21 LAB — TSH: TSH: 0.59 u[IU]/mL (ref 0.35–4.50)

## 2020-06-21 LAB — HEMOGLOBIN A1C: Hgb A1c MFr Bld: 5.7 % (ref 4.6–6.5)

## 2020-06-21 LAB — VITAMIN D 25 HYDROXY (VIT D DEFICIENCY, FRACTURES): VITD: 48.17 ng/mL (ref 30.00–100.00)

## 2020-06-21 MED ORDER — AMLODIPINE BESY-BENAZEPRIL HCL 10-40 MG PO CAPS: 1.0000 | ORAL_CAPSULE | Freq: Every day | ORAL | 3 refills | Status: DC

## 2020-06-21 MED ORDER — TRAMADOL HCL 50 MG PO TABS: 50.0000 mg | ORAL_TABLET | Freq: Four times a day (QID) | ORAL | 2 refills | Status: DC | PRN

## 2020-06-21 NOTE — Assessment & Plan Note (Signed)
Lab Results  Component Value Date   HGBA1C 5.7 06/21/2020   Stable, pt to continue current medical treatment  - diet

## 2020-06-21 NOTE — Assessment & Plan Note (Signed)

## 2020-06-21 NOTE — Assessment & Plan Note (Signed)
For pain med refill

## 2020-06-21 NOTE — Patient Instructions (Signed)

## 2020-06-21 NOTE — Progress Notes (Signed)
Patient ID: Wendy Torres, female   DOB: Jul 24, 1940, 80 y.o.   MRN: [id number]         Chief Complaint:: wellness exam and hyperglycemia       HPI:  Wendy Torres is a 80 y.o. female here for wellness exam; currently up to date with preventive referrals and immunizations                         Also on 2L home o2; Pt denies chest pain, increased sob or doe, wheezing, orthopnea, PND, increased LE swelling, palpitations, dizziness or syncope.   Pt denies polydipsia, polyuria  Denies neuro focal ss.  No new complaints   Wt Readings from Last 3 Encounters:  06/21/20 195 lb (88.5 kg)  05/11/20 196 lb 9.6 oz (89.2 kg)  05/02/20 198 lb (89.8 kg)   BP Readings from Last 3 Encounters:  06/21/20 140/68  06/15/20 (!) 148/64  05/11/20 (!) 178/76   Immunization History  Administered Date(s) Administered  . Influenza Split 04/07/2011, 02/28/2012  . Influenza Whole 04/15/2004, 04/18/2007, 01/27/2010  . Influenza, High Dose Seasonal PF 02/04/2018, 12/10/2018  . Influenza,inj,Quad PF,6+ Mos 03/16/2020  . Influenza,inj,quad, With Preservative 01/29/2017  . PFIZER(Purple Top)SARS-COV-2 Vaccination 09/14/2019, 10/12/2019  . Pneumococcal Conjugate-13 12/03/2013  . Pneumococcal Polysaccharide-23 05/02/2003, 09/29/2008, 12/10/2018  . Tdap 04/07/2011  There are no preventive care reminders to display for this patient.    Past Medical History:  Diagnosis Date  . ABNORMAL CHEST XRAY 11/05/2008  . ACUTE URIS OF UNSPECIFIED SITE 10/22/2007  . ALLERGIC RHINITIS 12/09/2006  . ANEMIA 01/14/2009  . Anxiety   . Arthritis   . ASTHMA 12/09/2006  . Asthma   . Blood transfusion without reported diagnosis   . Cataract    bilateral -removed  . CHF (congestive heart failure) ([place])   . Chronic pain syndrome 02/16/2009  . COPD 12/09/2006  . DEGENERATIVE JOINT DISEASE 12/09/2006  . DIZZINESS 03/30/2010  . DYSPNEA 01/27/2010   with heavy exertion  . FREQUENCY, URINARY 03/30/2010  . GERD 12/09/2006  . Glossitis  01/14/2009  . HELICOBACTER PYLORI GASTRITIS, HX OF 12/09/2006  . HIATAL HERNIA 03/01/2010  . History of hiatal hernia   . HYPERLIPIDEMIA 04/18/2007  . HYPERSOMNIA 08/31/2008  . HYPERTENSION 12/09/2006  . INSOMNIA-SLEEP DISORDER-UNSPEC 11/05/2008  . LOW BACK PAIN 12/09/2006  . Morbid obesity ([place]) 12/09/2006  . Nonspecific (abnormal) findings on radiological and other examination of body structure 11/05/2008  . OSTEOPOROSIS 12/09/2006  . OVERACTIVE BLADDER 04/14/2008  . PALPITATIONS, RECURRENT 01/27/2010  . Stroke [facility])    pt. stated light one years ago  . SYNCOPE 11/05/2008  . TRANSIENT ISCHEMIC ATTACK, HX OF 12/09/2006   Past Surgical History:  Procedure Laterality Date  . COLONOSCOPY    . LUMBAR DISC SURGERY    . TONSILLECTOMY     as a child  . TOTAL HIP ARTHROPLASTY Right 11/06/2012   Procedure: RIGHT TOTAL HIP ARTHROPLASTY WITH ACETABULAR AUTOGRAFT;  Surgeon: Gearlean Alf, MD;  [facility];  Service: Orthopedics;  Laterality: Right;  . TOTAL HIP ARTHROPLASTY Left 02/18/2014   Procedure: LEFT TOTAL HIP ARTHROPLASTY;  Surgeon: Gearlean Alf, MD;  Location: [facility];  Service: Orthopedics;  Laterality: Left;  . TUBAL LIGATION      reports that she quit smoking about 47 years ago. Her smoking use included cigarettes. She has a 12.50 pack-year smoking history. She has never used smokeless tobacco. She reports that she does not drink alcohol  and does not use drugs. family history includes Heart disease in her mother; Hypertension in her brother; Lung cancer in her father. No Known Allergies Current Outpatient Medications on File Prior to Visit  Medication Sig Dispense Refill  . acetaminophen (TYLENOL) 325 MG tablet Take 2 tablets (650 mg total) by mouth every 6 (six) hours as needed for mild pain (or Fever >/= 101). 40 tablet 0  . ALPRAZolam (XANAX) 0.25 MG tablet TAKE 1 TABLET BY MOUTH 2 TIMES DAILY AS NEEDED. 60 tablet 2  . budesonide-formoterol (SYMBICORT) 160-4.5 MCG/ACT inhaler Inhale 2  puffs into the lungs 2 (two) times daily. 1 each 5  . CHLORTHALIDONE PO Take 0.5 tablets by mouth daily.    . citalopram (CELEXA) 10 MG tablet Take 1 tablet (10 mg total) by mouth daily. 90 tablet 3  . cloNIDine (CATAPRES - DOSED IN MG/24 HR) 0.1 mg/24hr patch Place 1 patch (0.1 mg total) onto the skin once a week. 12 patch 3  . furosemide (LASIX) 20 MG tablet Take 1 tablet (20 mg total) by mouth every morning. 90 tablet 3  . gabapentin (NEURONTIN) 300 MG capsule TAKE 1 CAPSULE BY MOUTH 3  TIMES DAILY 270 capsule 1  . hydrALAZINE (APRESOLINE) 50 MG tablet Take 2 tablets (100 mg total) by mouth 3 (three) times daily. 270 tablet 3  . lovastatin (MEVACOR) 40 MG tablet TAKE 1 TABLET BY MOUTH AT  BEDTIME 90 tablet 3  . nystatin (MYCOSTATIN) 100000 UNIT/ML suspension Swish and spit 1mL 2 times daily after using Symbicort. 300 mL 5  . oxybutynin (DITROPAN) 5 MG tablet TAKE 1 TABLET BY MOUTH 3  TIMES DAILY 270 tablet 3  . pantoprazole (PROTONIX) 40 MG tablet TAKE 1 TABLET BY MOUTH  DAILY 90 tablet 3  . PROAIR HFA 108 (90 Base) MCG/ACT inhaler TAKE 2 PUFFS BY MOUTH EVERY 6 HOURS AS NEEDED FOR WHEEZE OR SHORTNESS OF BREATH 8.5 each 2  . tiZANidine (ZANAFLEX) 4 MG tablet TAKE 1 TABLET (4 MG TOTAL) BY MOUTH EVERY 6 (SIX) HOURS AS NEEDED FOR MUSCLE SPASMS. 50 tablet 1  . traZODone (DESYREL) 50 MG tablet TAKE 1/2 TO 1 TABLET BY MOUTH AT BEDTIME AS NEEDED FOR SLEEP 90 tablet 1  . [DISCONTINUED] lansoprazole (PREVACID) 30 MG capsule Take 1 capsule (30 mg total) by mouth daily. 90 capsule 3   No current facility-administered medications on file prior to visit.        ROS:  All others reviewed and negative.  Objective        PE:  BP 140/68   Pulse 74   Temp 98.5 F (36.9 C) (Oral)   Ht 5\' 4"  (1.626 m)   Wt 195 lb (88.5 kg)   SpO2 97%   BMI 33.47 kg/m                 Constitutional: Pt appears in NAD               HENT: Head: NCAT.                Right Ear: External ear normal.                 Left  Ear: External ear normal.                Eyes: . Pupils are equal, round, and reactive to light. Conjunctivae and EOM are normal               Nose: without  d/c or deformity               Neck: Neck supple. Gross normal ROM               Cardiovascular: Normal rate and regular rhythm.                 Pulmonary/Chest: Effort normal and breath sounds without rales or wheezing.                Abd:  Soft, NT, ND, + BS, no organomegaly               Neurological: Pt is alert. At baseline orientation, motor grossly intact               Skin: Skin is warm. No rashes, no other new lesions, LE edema - none               Psychiatric: Pt behavior is normal without agitation   Micro: none  Cardiac tracings I have personally interpreted today:  none  Pertinent Radiological findings (summarize): none   Lab Results  Component Value Date   WBC 6.8 06/21/2020   HGB 11.9 (L) 06/21/2020   HCT 36.0 06/21/2020   PLT 334.0 06/21/2020   GLUCOSE 85 06/21/2020   CHOL 184 06/21/2020   TRIG 70.0 06/21/2020   HDL 73.80 06/21/2020   LDLCALC 96 06/21/2020   ALT 11 06/21/2020   AST 17 06/21/2020   NA 134 (L) 06/21/2020   K 4.2 06/21/2020   CL 94 (L) 06/21/2020   CREATININE 1.02 06/21/2020   BUN 10 06/21/2020   CO2 32 06/21/2020   TSH 0.59 06/21/2020   INR 1.02 02/10/2014   HGBA1C 5.7 06/21/2020   Assessment/Plan:  Wendy Torres is a 80 y.o. Black or African American [2] female with  has a past medical history of ABNORMAL CHEST XRAY (11/05/2008), ACUTE URIS OF UNSPECIFIED SITE (10/22/2007), ALLERGIC RHINITIS (12/09/2006), ANEMIA (01/14/2009), Anxiety, Arthritis, ASTHMA (12/09/2006), Asthma, Blood transfusion without reported diagnosis, Cataract, CHF (congestive heart failure) ([place]), Chronic pain syndrome (02/16/2009), COPD (12/09/2006), DEGENERATIVE JOINT DISEASE (12/09/2006), DIZZINESS (03/30/2010), DYSPNEA (01/27/2010), FREQUENCY, URINARY (03/30/2010), GERD (12/09/2006), Glossitis (7/34/2876), HELICOBACTER PYLORI  GASTRITIS, HX OF (12/09/2006), HIATAL HERNIA (03/01/2010), History of hiatal hernia, HYPERLIPIDEMIA (04/18/2007), HYPERSOMNIA (08/31/2008), HYPERTENSION (12/09/2006), INSOMNIA-SLEEP DISORDER-UNSPEC (11/05/2008), LOW BACK PAIN (12/09/2006), Morbid obesity ([place]) (12/09/2006), Nonspecific (abnormal) findings on radiological and other examination of body structure (11/05/2008), OSTEOPOROSIS (12/09/2006), OVERACTIVE BLADDER (04/14/2008), PALPITATIONS, RECURRENT (01/27/2010), Stroke ([place]), SYNCOPE (11/05/2008), and TRANSIENT ISCHEMIC ATTACK, HX OF (12/09/2006).  Preventative health care Age and sex appropriate education and counseling updated with regular exercise and diet Referrals for preventative services - none needed Immunizations addressed - none needed Smoking counseling  - none needed Evidence for depression or other mood disorder - none significant Most recent labs reviewed. I have personally reviewed and have noted: 1) the patient's medical and social history 2) The patient's current medications and supplements 3) The patient's height, weight, and BMI have been recorded in the chart   Prediabetes Lab Results  Component Value Date   HGBA1C 5.7 06/21/2020   Stable, pt to continue current medical treatment  - diet   Osteoarthritis For pain med refill  Followup: Return in about 6 months (around 12/19/2020).  Cathlean Cower, MD 06/21/2020 8:11 PM [facility]

## 2020-06-22 ENCOUNTER — Ambulatory Visit: Payer: Medicare Other | Admitting: Internal Medicine

## 2020-06-29 DIAGNOSIS — M6281 Muscle weakness (generalized): Secondary | ICD-10-CM | POA: Diagnosis not present

## 2020-06-29 DIAGNOSIS — Z96642 Presence of left artificial hip joint: Secondary | ICD-10-CM | POA: Diagnosis not present

## 2020-06-29 DIAGNOSIS — R269 Unspecified abnormalities of gait and mobility: Secondary | ICD-10-CM | POA: Diagnosis not present

## 2020-06-29 DIAGNOSIS — Z471 Aftercare following joint replacement surgery: Secondary | ICD-10-CM | POA: Diagnosis not present

## 2020-07-12 DIAGNOSIS — I10 Essential (primary) hypertension: Secondary | ICD-10-CM | POA: Diagnosis not present

## 2020-07-12 DIAGNOSIS — I129 Hypertensive chronic kidney disease with stage 1 through stage 4 chronic kidney disease, or unspecified chronic kidney disease: Secondary | ICD-10-CM | POA: Diagnosis not present

## 2020-07-12 DIAGNOSIS — E785 Hyperlipidemia, unspecified: Secondary | ICD-10-CM | POA: Diagnosis not present

## 2020-07-12 DIAGNOSIS — N1832 Chronic kidney disease, stage 3b: Secondary | ICD-10-CM | POA: Diagnosis not present

## 2020-07-15 ENCOUNTER — Other Ambulatory Visit: Payer: Self-pay | Admitting: Allergy and Immunology

## 2020-07-15 ENCOUNTER — Encounter: Payer: Self-pay | Admitting: Internal Medicine

## 2020-07-15 MED ORDER — CLONIDINE HCL 0.1 MG PO TABS: 0.1000 mg | ORAL_TABLET | Freq: Two times a day (BID) | ORAL | 3 refills | Status: DC

## 2020-07-27 ENCOUNTER — Other Ambulatory Visit: Payer: Self-pay | Admitting: Internal Medicine

## 2020-07-30 DIAGNOSIS — Z96642 Presence of left artificial hip joint: Secondary | ICD-10-CM | POA: Diagnosis not present

## 2020-07-30 DIAGNOSIS — Z471 Aftercare following joint replacement surgery: Secondary | ICD-10-CM | POA: Diagnosis not present

## 2020-07-30 DIAGNOSIS — R269 Unspecified abnormalities of gait and mobility: Secondary | ICD-10-CM | POA: Diagnosis not present

## 2020-07-30 DIAGNOSIS — M6281 Muscle weakness (generalized): Secondary | ICD-10-CM | POA: Diagnosis not present

## 2020-08-05 ENCOUNTER — Other Ambulatory Visit: Payer: Self-pay | Admitting: Internal Medicine

## 2020-08-17 ENCOUNTER — Encounter: Payer: Self-pay | Admitting: Internal Medicine

## 2020-08-20 ENCOUNTER — Other Ambulatory Visit: Payer: Self-pay | Admitting: Internal Medicine

## 2020-08-20 NOTE — Telephone Encounter (Signed)
Please refill as per office routine med refill policy (all routine meds refilled for 3 mo or monthly per pt preference up to one year from last visit, then month to month grace period for 3 mo, then further med refills will have to be denied)  

## 2020-08-25 ENCOUNTER — Other Ambulatory Visit: Payer: Self-pay | Admitting: Internal Medicine

## 2020-08-25 NOTE — Telephone Encounter (Signed)
Please refill as per office routine med refill policy (all routine meds refilled for 3 mo or monthly per pt preference up to one year from last visit, then month to month grace period for 3 mo, then further med refills will have to be denied)  

## 2020-08-26 ENCOUNTER — Telehealth: Payer: Self-pay

## 2020-08-26 NOTE — Telephone Encounter (Signed)
Pt was seen on 06/21/20 with HP "here for welIness exam".  Please confirm if this OV can be used for AWV & if CPT code can be changed from 99397 to V2023.  If not, pt will be scheduled for AWV.

## 2020-08-26 NOTE — Telephone Encounter (Signed)
Not sure about the G code   But we would want to see her back for Hyperglycemia at least in 6 mo   And OK for Health Coach wellness exam as well if she likes, thanks

## 2020-08-26 NOTE — Telephone Encounter (Signed)
Pt appt time with PCP changed; AWV scheduled for same day.  Daughter verb understanding.

## 2020-08-29 DIAGNOSIS — R269 Unspecified abnormalities of gait and mobility: Secondary | ICD-10-CM | POA: Diagnosis not present

## 2020-08-29 DIAGNOSIS — M6281 Muscle weakness (generalized): Secondary | ICD-10-CM | POA: Diagnosis not present

## 2020-08-29 DIAGNOSIS — Z96642 Presence of left artificial hip joint: Secondary | ICD-10-CM | POA: Diagnosis not present

## 2020-08-29 DIAGNOSIS — Z471 Aftercare following joint replacement surgery: Secondary | ICD-10-CM | POA: Diagnosis not present

## 2020-09-01 ENCOUNTER — Other Ambulatory Visit: Payer: Self-pay | Admitting: Allergy and Immunology

## 2020-09-13 ENCOUNTER — Other Ambulatory Visit: Payer: Self-pay

## 2020-09-13 ENCOUNTER — Ambulatory Visit: Payer: Medicare Other | Admitting: Pulmonary Disease

## 2020-09-13 ENCOUNTER — Encounter: Payer: Self-pay | Admitting: Pulmonary Disease

## 2020-09-13 ENCOUNTER — Ambulatory Visit (INDEPENDENT_AMBULATORY_CARE_PROVIDER_SITE_OTHER): Payer: Medicare Other

## 2020-09-13 VITALS — BP 142/72 | HR 72 | Temp 97.6°F | Ht 64.0 in | Wt 200.2 lb

## 2020-09-13 DIAGNOSIS — J449 Chronic obstructive pulmonary disease, unspecified: Secondary | ICD-10-CM

## 2020-09-13 DIAGNOSIS — J439 Emphysema, unspecified: Secondary | ICD-10-CM | POA: Diagnosis not present

## 2020-09-13 DIAGNOSIS — I517 Cardiomegaly: Secondary | ICD-10-CM | POA: Diagnosis not present

## 2020-09-13 NOTE — Addendum Note (Signed)
Addended by: Elton Sin on: 09/13/2020 11:08 AM   Modules accepted: Orders

## 2020-09-13 NOTE — Progress Notes (Signed)
Wendy Torres    [id number]    05-25-1940  Primary Care Physician:John, Hunt Oris, MD  Referring Physician: Biagio Borg, MD [address] [place],  [place] [address]  Chief complaint: Follow-up for COPD  HPI: 80 year old with history of emphysema, hypertension, diastolic heart failure.  Referred here for evaluation of emphysema Complaints of dyspnea with activity, no symptoms at rest.  Denies any cough, sputum production, wheezing She was hospitalized in April 2018 for COPD exacerbation, CHF exacerbation and has been on supplemental oxygen since then Symbicort changed to [place] in 2019 and she is doing well with this change  Pets: No pets  Occupation: Homemaker Exposures: No known exposures.  Reports a mildew problem in the basement but she hardly goes there Smoking history: 25-pack-year smoker.  Quit in 1974 Travel history: No significant travel Relevant family history: Father had emphysema  Interim history: No new complaints.  This is her annual follow-up Has chronic dyspnea on exertion  Stiolto has been changed to Advair at some point by primary care due to insurance cost She feels that the Advair is working as well as for her as stiolto  Outpatient Encounter Medications as of 09/13/2020  Medication Sig  . acetaminophen (TYLENOL) 325 MG tablet Take 2 tablets (650 mg total) by mouth every 6 (six) hours as needed for mild pain (or Fever >/= 101).  Marland Kitchen ALPRAZolam (XANAX) 0.25 MG tablet TAKE 1 TABLET BY MOUTH 2 TIMES DAILY AS NEEDED.  Marland Kitchen amLODipine-benazepril (LOTREL) 10-40 MG capsule Take 1 capsule by mouth daily.  . CHLORTHALIDONE PO Take 0.5 tablets by mouth daily.  . cloNIDine (CATAPRES) 0.1 MG tablet Take 1 tablet (0.1 mg total) by mouth 2 (two) times daily.  . furosemide (LASIX) 20 MG tablet TAKE 1 TABLET BY MOUTH IN  THE MORNING  . gabapentin (NEURONTIN) 300 MG capsule TAKE 1 CAPSULE BY MOUTH 3  TIMES DAILY  . hydrALAZINE (APRESOLINE) 50 MG tablet Take 2  tablets (100 mg total) by mouth 3 (three) times daily.  . lansoprazole (PREVACID) 30 MG capsule Take 30 mg by mouth daily at 12 noon.  . lovastatin (MEVACOR) 40 MG tablet TAKE 1 TABLET BY MOUTH AT  BEDTIME  . nystatin (MYCOSTATIN) 100000 UNIT/ML suspension SWISH AND SPIT 5MLS 2 TIMES DAILY AFTER USING SYMBICORT.  Marland Kitchen oxybutynin (DITROPAN) 5 MG tablet TAKE 1 TABLET BY MOUTH 3  TIMES DAILY  . pantoprazole (PROTONIX) 40 MG tablet TAKE 1 TABLET BY MOUTH  DAILY  . PROAIR HFA 108 (90 Base) MCG/ACT inhaler TAKE 2 PUFFS BY MOUTH EVERY 6 HOURS AS NEEDED FOR WHEEZE OR SHORTNESS OF BREATH  . SYMBICORT 160-4.5 MCG/ACT inhaler TAKE 2 PUFFS BY MOUTH TWICE A DAY  . tiZANidine (ZANAFLEX) 4 MG tablet TAKE 1 TABLET BY MOUTH EVERY 6 HOURS AS NEEDED FOR MUSCLE SPASMS  . traMADol (ULTRAM) 50 MG tablet Take 1-2 tablets (50-100 mg total) by mouth every 6 (six) hours as needed. for pain  . traZODone (DESYREL) 50 MG tablet TAKE 1/2 TO 1 TABLET BY MOUTH AT BEDTIME AS NEEDED FOR SLEEP  . [DISCONTINUED] lansoprazole (PREVACID) 30 MG capsule Take 30 mg by mouth daily.  . [DISCONTINUED] citalopram (CELEXA) 10 MG tablet Take 1 tablet (10 mg total) by mouth daily. (Patient not taking: Reported on 09/13/2020)  . [DISCONTINUED] lansoprazole (PREVACID) 30 MG capsule Take 1 capsule (30 mg total) by mouth daily.   No facility-administered encounter medications on file as of 09/13/2020.  Physical Exam: Blood pressure (!) 142/72, pulse 72, temperature 97.6 F (36.4 C), temperature source Temporal, height 5\' 4"  (1.626 m), weight 200 lb 3.2 oz (90.8 kg), SpO2 97 %. Gen:      No acute distress HEENT:  EOMI, sclera anicteric Neck:     No masses; no thyromegaly Lungs:    Clear to auscultation bilaterally; normal respiratory effort CV:         Regular rate and rhythm; no murmurs Abd:      + bowel sounds; soft, non-tender; no palpable masses, no distension Ext:    No edema; adequate peripheral perfusion Skin:      Warm and dry; no  rash Neuro: alert and oriented x 3 Psych: normal mood and affect  Data Reviewed: Imaging CT chest 10/17/2008- centrilobular emphysema in the upper lobes, 2 mm nodule in the right lung.  Chest x-ray 08/25/2017- no acute cardiopulmonary abnormality I have reviewed the images personally  PFTs  12/21/2017 FVC 1.75 [9%), FEV1 1.10 [73%], F/F 63, TLC 94%, DLCO 41% Moderate obstruction, severe diffusion defect.  6-minute walk test 02/04/2018 96 m, Patient had to stop for desats.  Titrated to 3 L oxygen  Labs CBC 02/04/2018-WBC 7.4, eos 2.5%, absolute eosinophil count 185 Alpha-1 antitrypsin 12/05/2017-159, PI MM IgE 12/05/2017-224  Assessment:  COPD CT scan from 2010 reviewed which shows emphysematous changes in the upper lobes.   PFTs reviewed which show moderate obstruction with no significant bronchodilator response.  Currently on Advair with stable symptoms  Continue supplemental oxygen with exercise and at night. Chest x-ray today Follow-up in 1 year  Health maintenance 12/03/2013-Prevnar 12/08/2018-Pneumovax  Plan/Recommendations: - Continue advair - Chest x-ray  From follow-up in 1 year  Marshell Garfinkel MD [facility] 09/13/2020, 10:56 AM  CC: Biagio Borg, MD

## 2020-09-13 NOTE — Patient Instructions (Signed)
Glad your breathing is stable Continue the advair inhaler  Get a chest x-ray today  Follow-up in 1 year

## 2020-09-15 ENCOUNTER — Other Ambulatory Visit: Payer: Self-pay | Admitting: Internal Medicine

## 2020-09-15 NOTE — Telephone Encounter (Signed)
Patient is requesting a refill of the following medications: Requested Prescriptions   Pending Prescriptions Disp Refills  . ALPRAZolam (XANAX) 0.25 MG tablet [Pharmacy Med Name: ALPRAZOLAM 0.25 MG TABLET] 60 tablet     Sig: TAKE 1 TABLET BY MOUTH TWICE A DAY AS NEEDED    Date of patient request: 09/15/20  Last office visit: 06/21/20  Date of last refill: 03/24/20  Last refill amount: 60,2   Follow up time period per chart: 10/15/20

## 2020-09-29 DIAGNOSIS — Z471 Aftercare following joint replacement surgery: Secondary | ICD-10-CM | POA: Diagnosis not present

## 2020-09-29 DIAGNOSIS — M6281 Muscle weakness (generalized): Secondary | ICD-10-CM | POA: Diagnosis not present

## 2020-09-29 DIAGNOSIS — Z96642 Presence of left artificial hip joint: Secondary | ICD-10-CM | POA: Diagnosis not present

## 2020-09-29 DIAGNOSIS — R269 Unspecified abnormalities of gait and mobility: Secondary | ICD-10-CM | POA: Diagnosis not present

## 2020-10-04 ENCOUNTER — Other Ambulatory Visit: Payer: Self-pay | Admitting: Internal Medicine

## 2020-10-04 DIAGNOSIS — M159 Polyosteoarthritis, unspecified: Secondary | ICD-10-CM

## 2020-10-04 DIAGNOSIS — M8949 Other hypertrophic osteoarthropathy, multiple sites: Secondary | ICD-10-CM

## 2020-10-13 DIAGNOSIS — E785 Hyperlipidemia, unspecified: Secondary | ICD-10-CM | POA: Diagnosis not present

## 2020-10-13 DIAGNOSIS — I10 Essential (primary) hypertension: Secondary | ICD-10-CM | POA: Diagnosis not present

## 2020-10-13 DIAGNOSIS — D631 Anemia in chronic kidney disease: Secondary | ICD-10-CM | POA: Diagnosis not present

## 2020-10-13 DIAGNOSIS — N2581 Secondary hyperparathyroidism of renal origin: Secondary | ICD-10-CM | POA: Diagnosis not present

## 2020-10-13 DIAGNOSIS — I129 Hypertensive chronic kidney disease with stage 1 through stage 4 chronic kidney disease, or unspecified chronic kidney disease: Secondary | ICD-10-CM | POA: Diagnosis not present

## 2020-10-13 DIAGNOSIS — N1832 Chronic kidney disease, stage 3b: Secondary | ICD-10-CM | POA: Diagnosis not present

## 2020-10-14 ENCOUNTER — Other Ambulatory Visit: Payer: Self-pay

## 2020-10-15 ENCOUNTER — Encounter: Payer: Medicare Other | Admitting: Internal Medicine

## 2020-10-15 ENCOUNTER — Other Ambulatory Visit: Payer: Self-pay

## 2020-10-15 ENCOUNTER — Ambulatory Visit (INDEPENDENT_AMBULATORY_CARE_PROVIDER_SITE_OTHER): Payer: Medicare Other

## 2020-10-15 ENCOUNTER — Ambulatory Visit (INDEPENDENT_AMBULATORY_CARE_PROVIDER_SITE_OTHER): Payer: Medicare Other | Admitting: Internal Medicine

## 2020-10-15 ENCOUNTER — Encounter: Payer: Self-pay | Admitting: Internal Medicine

## 2020-10-15 VITALS — BP 152/78 | HR 69 | Temp 98.1°F | Ht 64.0 in | Wt 195.8 lb

## 2020-10-15 DIAGNOSIS — E785 Hyperlipidemia, unspecified: Secondary | ICD-10-CM | POA: Diagnosis not present

## 2020-10-15 DIAGNOSIS — R7303 Prediabetes: Secondary | ICD-10-CM | POA: Diagnosis not present

## 2020-10-15 DIAGNOSIS — I10 Essential (primary) hypertension: Secondary | ICD-10-CM

## 2020-10-15 DIAGNOSIS — Z0000 Encounter for general adult medical examination without abnormal findings: Secondary | ICD-10-CM | POA: Diagnosis not present

## 2020-10-15 DIAGNOSIS — J9611 Chronic respiratory failure with hypoxia: Secondary | ICD-10-CM

## 2020-10-15 DIAGNOSIS — M1710 Unilateral primary osteoarthritis, unspecified knee: Secondary | ICD-10-CM

## 2020-10-15 NOTE — Patient Instructions (Signed)
Wendy Torres , Thank you for taking time to come for your Medicare Wellness Visit. I appreciate your ongoing commitment to your health goals. Please review the following plan we discussed and let me know if I can assist you in the future.   Screening recommendations/referrals: Colonoscopy: no repeat due to age 80: no repeat due to age Bone Density: 06/19/2012; no longer recommended Recommended yearly ophthalmology/optometry visit for glaucoma screening and checkup Recommended yearly dental visit for hygiene and checkup  Vaccinations: Influenza vaccine: 03/16/2020 Pneumococcal vaccine: 12/03/2013, 12/10/2018 Tdap vaccine: 04/07/2011; due every 10 years Shingles vaccine: never done   Covid-19: 09/14/2019, 10/12/2019  Advanced directives: Advance directive discussed with you today. I have provided a copy for you to complete at home and have notarized. Once this is complete please bring a copy in to our office so we can scan it into your chart.  Conditions/risks identified: Patient stated that she has no healthcare goals for this year.  Next appointment: Please schedule your next Medicare Wellness Visit with your Nurse Health Advisor in 1 year by calling [phone].   Preventive Care 26 Years and Older, Female Preventive care refers to lifestyle choices and visits with your health care provider that can promote health and wellness. What does preventive care include? A yearly physical exam. This is also called an annual well check. Dental exams once or twice a year. Routine eye exams. Ask your health care provider how often you should have your eyes checked. Personal lifestyle choices, including: Daily care of your teeth and gums. Regular physical activity. Eating a healthy diet. Avoiding tobacco and drug use. Limiting alcohol use. Practicing safe sex. Taking low-dose aspirin every day. Taking vitamin and mineral supplements as recommended by your health care provider. What happens  during an annual well check? The services and screenings done by your health care provider during your annual well check will depend on your age, overall health, lifestyle risk factors, and family history of disease. Counseling  Your health care provider may ask you questions about your: Alcohol use. Tobacco use. Drug use. Emotional well-being. Home and relationship well-being. Sexual activity. Eating habits. History of falls. Memory and ability to understand (cognition). Work and work Statistician. Reproductive health. Screening  You may have the following tests or measurements: Height, weight, and BMI. Blood pressure. Lipid and cholesterol levels. These may be checked every 5 years, or more frequently if you are over 69 years old. Skin check. Lung cancer screening. You may have this screening every year starting at age 73 if you have a 30-pack-year history of smoking and currently smoke or have quit within the past 15 years. Fecal occult blood test (FOBT) of the stool. You may have this test every year starting at age 34. Flexible sigmoidoscopy or colonoscopy. You may have a sigmoidoscopy every 5 years or a colonoscopy every 10 years starting at age 44. Hepatitis C blood test. Hepatitis B blood test. Sexually transmitted disease (STD) testing. Diabetes screening. This is done by checking your blood sugar (glucose) after you have not eaten for a while (fasting). You may have this done every 1-3 years. Bone density scan. This is done to screen for osteoporosis. You may have this done starting at age 31. Mammogram. This may be done every 1-2 years. Talk to your health care provider about how often you should have regular mammograms. Talk with your health care provider about your test results, treatment options, and if necessary, the need for more tests. Vaccines  Your health care  provider may recommend certain vaccines, such as: Influenza vaccine. This is recommended every  year. Tetanus, diphtheria, and acellular pertussis (Tdap, Td) vaccine. You may need a Td booster every 10 years. Zoster vaccine. You may need this after age 48. Pneumococcal 13-valent conjugate (PCV13) vaccine. One dose is recommended after age 23. Pneumococcal polysaccharide (PPSV23) vaccine. One dose is recommended after age 65. Talk to your health care provider about which screenings and vaccines you need and how often you need them. This information is not intended to replace advice given to you by your health care provider. Make sure you discuss any questions you have with your health care provider. Document Released: 05/14/2015 Document Revised: 01/05/2016 Document Reviewed: 02/16/2015 Elsevier Interactive Patient Education  2017 [place] Prevention in the Home Falls can cause injuries. They can happen to people of all ages. There are many things you can do to make your home safe and to help prevent falls. What can I do on the outside of my home? Regularly fix the edges of walkways and driveways and fix any cracks. Remove anything that might make you trip as you walk through a door, such as a raised step or threshold. Trim any bushes or trees on the path to your home. Use bright outdoor lighting. Clear any walking paths of anything that might make someone trip, such as rocks or tools. Regularly check to see if handrails are loose or broken. Make sure that both sides of any steps have handrails. Any raised decks and porches should have guardrails on the edges. Have any leaves, snow, or ice cleared regularly. Use sand or salt on walking paths during winter. Clean up any spills in your garage right away. This includes oil or grease spills. What can I do in the bathroom? Use night lights. Install grab bars by the toilet and in the tub and shower. Do not use towel bars as grab bars. Use non-skid mats or decals in the tub or shower. If you need to sit down in the shower, use a  plastic, non-slip stool. Keep the floor dry. Clean up any water that spills on the floor as soon as it happens. Remove soap buildup in the tub or shower regularly. Attach bath mats securely with double-sided non-slip rug tape. Do not have throw rugs and other things on the floor that can make you trip. What can I do in the bedroom? Use night lights. Make sure that you have a light by your bed that is easy to reach. Do not use any sheets or blankets that are too big for your bed. They should not hang down onto the floor. Have a firm chair that has side arms. You can use this for support while you get dressed. Do not have throw rugs and other things on the floor that can make you trip. What can I do in the kitchen? Clean up any spills right away. Avoid walking on wet floors. Keep items that you use a lot in easy-to-reach places. If you need to reach something above you, use a strong step stool that has a grab bar. Keep electrical cords out of the way. Do not use floor polish or wax that makes floors slippery. If you must use wax, use non-skid floor wax. Do not have throw rugs and other things on the floor that can make you trip. What can I do with my stairs? Do not leave any items on the stairs. Make sure that there are handrails on both  sides of the stairs and use them. Fix handrails that are broken or loose. Make sure that handrails are as long as the stairways. Check any carpeting to make sure that it is firmly attached to the stairs. Fix any carpet that is loose or worn. Avoid having throw rugs at the top or bottom of the stairs. If you do have throw rugs, attach them to the floor with carpet tape. Make sure that you have a light switch at the top of the stairs and the bottom of the stairs. If you do not have them, ask someone to add them for you. What else can I do to help prevent falls? Wear shoes that: Do not have high heels. Have rubber bottoms. Are comfortable and fit you  well. Are closed at the toe. Do not wear sandals. If you use a stepladder: Make sure that it is fully opened. Do not climb a closed stepladder. Make sure that both sides of the stepladder are locked into place. Ask someone to hold it for you, if possible. Clearly mark and make sure that you can see: Any grab bars or handrails. First and last steps. Where the edge of each step is. Use tools that help you move around (mobility aids) if they are needed. These include: Canes. Walkers. Scooters. Crutches. Turn on the lights when you go into a dark area. Replace any light bulbs as soon as they burn out. Set up your furniture so you have a clear path. Avoid moving your furniture around. If any of your floors are uneven, fix them. If there are any pets around you, be aware of where they are. Review your medicines with your doctor. Some medicines can make you feel dizzy. This can increase your chance of falling. Ask your doctor what other things that you can do to help prevent falls. This information is not intended to replace advice given to you by your health care provider. Make sure you discuss any questions you have with your health care provider. Document Released: 02/11/2009 Document Revised: 09/23/2015 Document Reviewed: 05/22/2014 Elsevier Interactive Patient Education  2017 [employer].

## 2020-10-15 NOTE — Progress Notes (Signed)
Patient ID: Wendy Torres, female   DOB: 1940-09-04, 80 y.o.   MRN: [id number]        Chief Complaint: follow up HTN, HLD and hyperglycemia. Djd, chronic respiratory failure       HPI:  Wendy Torres is a 80 y.o. female here to f/u with family support, currently on home o2 2L, Pt denies chest pain, increased sob or doe, wheezing, orthopnea, PND, increased LE swelling, palpitations, dizziness or syncope.   Pt denies polydipsia, polyuria, or new focal neuro s/s.  Trying to follow lower chol diet, goal ldl < 70  Also c/o mild arthritic pains intermittent but daily .  Also hydralazine increased to 100 tid per renal (just started after seen very recently, this is after chlorthalidone stopped due to hypernatremia, with plan to f/u lab at 2 mo.   Pt denies fever, wt loss, night sweats, loss of appetite, or other constitutional symptoms            Wt Readings from Last 3 Encounters:  10/15/20 195 lb 12.3 oz (88.8 kg)  10/15/20 195 lb 12.8 oz (88.8 kg)  09/13/20 200 lb 3.2 oz (90.8 kg)   BP Readings from Last 3 Encounters:  10/15/20 (!) 152/78  10/15/20 (!) 152/78  09/13/20 (!) 142/72         Past Medical History:  Diagnosis Date   ABNORMAL CHEST XRAY 11/05/2008   ACUTE URIS OF UNSPECIFIED SITE 10/22/2007   ALLERGIC RHINITIS 12/09/2006   ANEMIA 01/14/2009   Anxiety    Arthritis    ASTHMA 12/09/2006   Asthma    Blood transfusion without reported diagnosis    Cataract    bilateral -removed   CHF (congestive heart failure) ([place])    Chronic pain syndrome 02/16/2009   COPD 12/09/2006   DEGENERATIVE JOINT DISEASE 12/09/2006   DIZZINESS 03/30/2010   DYSPNEA 01/27/2010   with heavy exertion   FREQUENCY, URINARY 03/30/2010   GERD 12/09/2006   Glossitis 2/84/1324   HELICOBACTER PYLORI GASTRITIS, HX OF 12/09/2006   HIATAL HERNIA 03/01/2010   History of hiatal hernia    HYPERLIPIDEMIA 04/18/2007   HYPERSOMNIA 08/31/2008   HYPERTENSION 12/09/2006   INSOMNIA-SLEEP DISORDER-UNSPEC 11/05/2008   LOW BACK PAIN  12/09/2006   Morbid obesity ([place]) 12/09/2006   Nonspecific (abnormal) findings on radiological and other examination of body structure 11/05/2008   OSTEOPOROSIS 12/09/2006   OVERACTIVE BLADDER 04/14/2008   PALPITATIONS, RECURRENT 01/27/2010   Stroke ([place])    pt. stated light one years ago   SYNCOPE 11/05/2008   TRANSIENT ISCHEMIC ATTACK, HX OF 12/09/2006   Past Surgical History:  Procedure Laterality Date   COLONOSCOPY     LUMBAR DISC SURGERY     TONSILLECTOMY     as a child   TOTAL HIP ARTHROPLASTY Right 11/06/2012   Procedure: RIGHT TOTAL HIP ARTHROPLASTY WITH ACETABULAR AUTOGRAFT;  Surgeon: Gearlean Alf, MD;  Location: [facility];  Service: Orthopedics;  Laterality: Right;   TOTAL HIP ARTHROPLASTY Left 02/18/2014   Procedure: LEFT TOTAL HIP ARTHROPLASTY;  Surgeon: Gearlean Alf, MD;  Location: [facility];  Service: Orthopedics;  Laterality: Left;   TUBAL LIGATION      reports that she quit smoking about 48 years ago. Her smoking use included cigarettes. She has a 12.50 pack-year smoking history. She has never used smokeless tobacco. She reports that she does not drink alcohol and does not use drugs. family history includes Heart disease in her mother; Hypertension in her brother; Lung cancer in  her father. Allergies  Allergen Reactions   Chlorthalidone    Current Outpatient Medications on File Prior to Visit  Medication Sig Dispense Refill   acetaminophen (TYLENOL) 325 MG tablet Take 2 tablets (650 mg total) by mouth every 6 (six) hours as needed for mild pain (or Fever >/= 101). 40 tablet 0   ALPRAZolam (XANAX) 0.25 MG tablet TAKE 1 TABLET BY MOUTH TWICE A DAY AS NEEDED 60 tablet 2   amLODipine-benazepril (LOTREL) 10-40 MG capsule Take 1 capsule by mouth daily. 90 capsule 3   cloNIDine (CATAPRES) 0.1 MG tablet Take 1 tablet (0.1 mg total) by mouth 2 (two) times daily. 180 tablet 3   furosemide (LASIX) 20 MG tablet TAKE 1 TABLET BY MOUTH IN  THE MORNING 90 tablet 3   gabapentin (NEURONTIN)  300 MG capsule TAKE 1 CAPSULE BY MOUTH 3  TIMES DAILY 270 capsule 1   hydrALAZINE (APRESOLINE) 100 MG tablet Take 100 mg by mouth 3 (three) times daily.     lansoprazole (PREVACID) 30 MG capsule Take 30 mg by mouth daily at 12 noon.     lovastatin (MEVACOR) 40 MG tablet TAKE 1 TABLET BY MOUTH AT  BEDTIME 90 tablet 3   nystatin (MYCOSTATIN) 100000 UNIT/ML suspension SWISH AND SPIT 5MLS 2 TIMES DAILY AFTER USING SYMBICORT. 300 mL 5   oxybutynin (DITROPAN) 5 MG tablet TAKE 1 TABLET BY MOUTH 3  TIMES DAILY 270 tablet 3   pantoprazole (PROTONIX) 40 MG tablet TAKE 1 TABLET BY MOUTH  DAILY 90 tablet 3   PROAIR HFA 108 (90 Base) MCG/ACT inhaler TAKE 2 PUFFS BY MOUTH EVERY 6 HOURS AS NEEDED FOR WHEEZE OR SHORTNESS OF BREATH 8.5 each 2   SYMBICORT 160-4.5 MCG/ACT inhaler TAKE 2 PUFFS BY MOUTH TWICE A DAY 10.2 each 5   tiZANidine (ZANAFLEX) 4 MG tablet TAKE 1 TABLET BY MOUTH EVERY 6 HOURS AS NEEDED FOR MUSCLE SPASMS 50 tablet 1   traMADol (ULTRAM) 50 MG tablet TAKE 1-2 TABLETS (50-100 MG TOTAL) BY MOUTH EVERY 6 (SIX) HOURS AS NEEDED. FOR PAIN 120 tablet 2   traZODone (DESYREL) 50 MG tablet TAKE 1/2 TO 1 TABLET BY MOUTH AT BEDTIME AS NEEDED FOR SLEEP 90 tablet 1   No current facility-administered medications on file prior to visit.        ROS:  All others reviewed and negative.  Objective        PE:  BP (!) 152/78 (BP Location: Right Arm, Patient Position: Sitting, Cuff Size: Large)   Pulse 69   Temp 98.1 F (36.7 C) (Oral)   Ht 5\' 4"  (1.626 m)   Wt 195 lb 12.8 oz (88.8 kg)   SpO2 95%   BMI 33.61 kg/m                 Constitutional: Pt appears in NAD               HENT: Head: NCAT.                Right Ear: External ear normal.                 Left Ear: External ear normal.                Eyes: . Pupils are equal, round, and reactive to light. Conjunctivae and EOM are normal               Nose: without d/c or deformity  Neck: Neck supple. Gross normal ROM                Cardiovascular: Normal rate and regular rhythm.                 Pulmonary/Chest: Effort normal and breath sounds without rales or wheezing.                Abd:  Soft, NT, ND, + BS, no organomegaly               Neurological: Pt is alert. At baseline orientation, motor grossly intact               Skin: Skin is warm. No rashes, no other new lesions, LE edema  none               Psychiatric: Pt behavior is normal without agitation   Micro: none  Cardiac tracings I have personally interpreted today:  none  Pertinent Radiological findings (summarize): none   Lab Results  Component Value Date   WBC 6.8 06/21/2020   HGB 11.9 (L) 06/21/2020   HCT 36.0 06/21/2020   PLT 334.0 06/21/2020   GLUCOSE 85 06/21/2020   CHOL 184 06/21/2020   TRIG 70.0 06/21/2020   HDL 73.80 06/21/2020   LDLCALC 96 06/21/2020   ALT 11 06/21/2020   AST 17 06/21/2020   NA 134 (L) 06/21/2020   K 4.2 06/21/2020   CL 94 (L) 06/21/2020   CREATININE 1.02 06/21/2020   BUN 10 06/21/2020   CO2 32 06/21/2020   TSH 0.59 06/21/2020   INR 1.02 02/10/2014   HGBA1C 5.7 06/21/2020   Assessment/Plan:  SHELLYE ZANDI is a 80 y.o. Black or African American [2] female with  has a past medical history of ABNORMAL CHEST XRAY (11/05/2008), ACUTE URIS OF UNSPECIFIED SITE (10/22/2007), ALLERGIC RHINITIS (12/09/2006), ANEMIA (01/14/2009), Anxiety, Arthritis, ASTHMA (12/09/2006), Asthma, Blood transfusion without reported diagnosis, Cataract, CHF (congestive heart failure) ([place]), Chronic pain syndrome (02/16/2009), COPD (12/09/2006), DEGENERATIVE JOINT DISEASE (12/09/2006), DIZZINESS (03/30/2010), DYSPNEA (01/27/2010), FREQUENCY, URINARY (03/30/2010), GERD (12/09/2006), Glossitis (4/43/1540), HELICOBACTER PYLORI GASTRITIS, HX OF (12/09/2006), HIATAL HERNIA (03/01/2010), History of hiatal hernia, HYPERLIPIDEMIA (04/18/2007), HYPERSOMNIA (08/31/2008), HYPERTENSION (12/09/2006), INSOMNIA-SLEEP DISORDER-UNSPEC (11/05/2008), LOW BACK PAIN (12/09/2006), Morbid  obesity ([place]) (12/09/2006), Nonspecific (abnormal) findings on radiological and other examination of body structure (11/05/2008), OSTEOPOROSIS (12/09/2006), OVERACTIVE BLADDER (04/14/2008), PALPITATIONS, RECURRENT (01/27/2010), Stroke ([place]), SYNCOPE (11/05/2008), and TRANSIENT ISCHEMIC ATTACK, HX OF (12/09/2006).  Chronic respiratory failure with hypoxia ([facility]) Stable, cont home o2 2L  DJD (degenerative joint disease) of knee Mild uncontrolled, for topical Volt gel prn,  to f/u any worsening symptoms or concerns  Dyslipidemia, goal LDL below 130 Lab Results  Component Value Date   LDLCALC 96 06/21/2020   Mild uncontrolled, goal ldl < 70,, pt declines change of statin lovastatin 40   Current Outpatient Medications (Cardiovascular):    amLODipine-benazepril (LOTREL) 10-40 MG capsule, Take 1 capsule by mouth daily.   cloNIDine (CATAPRES) 0.1 MG tablet, Take 1 tablet (0.1 mg total) by mouth 2 (two) times daily.   furosemide (LASIX) 20 MG tablet, TAKE 1 TABLET BY MOUTH IN  THE MORNING   hydrALAZINE (APRESOLINE) 100 MG tablet, Take 100 mg by mouth 3 (three) times daily.   lovastatin (MEVACOR) 40 MG tablet, TAKE 1 TABLET BY MOUTH AT  BEDTIME  Current Outpatient Medications (Respiratory):    PROAIR HFA 108 (90 Base) MCG/ACT inhaler, TAKE 2 PUFFS BY MOUTH EVERY 6 HOURS AS NEEDED FOR WHEEZE OR  SHORTNESS OF BREATH   SYMBICORT 160-4.5 MCG/ACT inhaler, TAKE 2 PUFFS BY MOUTH TWICE A DAY  Current Outpatient Medications (Analgesics):    acetaminophen (TYLENOL) 325 MG tablet, Take 2 tablets (650 mg total) by mouth every 6 (six) hours as needed for mild pain (or Fever >/= 101).   traMADol (ULTRAM) 50 MG tablet, TAKE 1-2 TABLETS (50-100 MG TOTAL) BY MOUTH EVERY 6 (SIX) HOURS AS NEEDED. FOR PAIN   Current Outpatient Medications (Other):    ALPRAZolam (XANAX) 0.25 MG tablet, TAKE 1 TABLET BY MOUTH TWICE A DAY AS NEEDED   gabapentin (NEURONTIN) 300 MG capsule, TAKE 1 CAPSULE BY MOUTH 3  TIMES DAILY    lansoprazole (PREVACID) 30 MG capsule, Take 30 mg by mouth daily at 12 noon.   nystatin (MYCOSTATIN) 100000 UNIT/ML suspension, SWISH AND SPIT 5MLS 2 TIMES DAILY AFTER USING SYMBICORT.   oxybutynin (DITROPAN) 5 MG tablet, TAKE 1 TABLET BY MOUTH 3  TIMES DAILY   pantoprazole (PROTONIX) 40 MG tablet, TAKE 1 TABLET BY MOUTH  DAILY   tiZANidine (ZANAFLEX) 4 MG tablet, TAKE 1 TABLET BY MOUTH EVERY 6 HOURS AS NEEDED FOR MUSCLE SPASMS   traZODone (DESYREL) 50 MG tablet, TAKE 1/2 TO 1 TABLET BY MOUTH AT BEDTIME AS NEEDED FOR SLEEP   Essential hypertension BP Readings from Last 3 Encounters:  10/15/20 (!) 152/78  10/15/20 (!) 152/78  09/13/20 (!) 142/72   uncontrolled, pt to continue medical treatment including increased hydralazine 100 tid to start soon   Current Outpatient Medications (Cardiovascular):    amLODipine-benazepril (LOTREL) 10-40 MG capsule, Take 1 capsule by mouth daily.   cloNIDine (CATAPRES) 0.1 MG tablet, Take 1 tablet (0.1 mg total) by mouth 2 (two) times daily.   furosemide (LASIX) 20 MG tablet, TAKE 1 TABLET BY MOUTH IN  THE MORNING   hydrALAZINE (APRESOLINE) 100 MG tablet, Take 100 mg by mouth 3 (three) times daily.   lovastatin (MEVACOR) 40 MG tablet, TAKE 1 TABLET BY MOUTH AT  BEDTIME  Current Outpatient Medications (Respiratory):    PROAIR HFA 108 (90 Base) MCG/ACT inhaler, TAKE 2 PUFFS BY MOUTH EVERY 6 HOURS AS NEEDED FOR WHEEZE OR SHORTNESS OF BREATH   SYMBICORT 160-4.5 MCG/ACT inhaler, TAKE 2 PUFFS BY MOUTH TWICE A DAY  Current Outpatient Medications (Analgesics):    acetaminophen (TYLENOL) 325 MG tablet, Take 2 tablets (650 mg total) by mouth every 6 (six) hours as needed for mild pain (or Fever >/= 101).   traMADol (ULTRAM) 50 MG tablet, TAKE 1-2 TABLETS (50-100 MG TOTAL) BY MOUTH EVERY 6 (SIX) HOURS AS NEEDED. FOR PAIN   Current Outpatient Medications (Other):    ALPRAZolam (XANAX) 0.25 MG tablet, TAKE 1 TABLET BY MOUTH TWICE A DAY AS NEEDED   gabapentin  (NEURONTIN) 300 MG capsule, TAKE 1 CAPSULE BY MOUTH 3  TIMES DAILY   lansoprazole (PREVACID) 30 MG capsule, Take 30 mg by mouth daily at 12 noon.   nystatin (MYCOSTATIN) 100000 UNIT/ML suspension, SWISH AND SPIT 5MLS 2 TIMES DAILY AFTER USING SYMBICORT.   oxybutynin (DITROPAN) 5 MG tablet, TAKE 1 TABLET BY MOUTH 3  TIMES DAILY   pantoprazole (PROTONIX) 40 MG tablet, TAKE 1 TABLET BY MOUTH  DAILY   tiZANidine (ZANAFLEX) 4 MG tablet, TAKE 1 TABLET BY MOUTH EVERY 6 HOURS AS NEEDED FOR MUSCLE SPASMS   traZODone (DESYREL) 50 MG tablet, TAKE 1/2 TO 1 TABLET BY MOUTH AT BEDTIME AS NEEDED FOR SLEEP   Prediabetes Lab Results  Component Value Date   HGBA1C  5.7 06/21/2020   Stable, pt to continue current medical treatment  - diet  Followup: Return in about 6 months (around 04/16/2021).  Cathlean Cower, MD 10/18/2020 9:27 PM [facility]

## 2020-10-15 NOTE — Progress Notes (Signed)
Subjective:   Wendy Torres is a 80 y.o. female who presents for Medicare Annual (Subsequent) preventive examination.  Review of Systems     Cardiac Risk Factors include: advanced age (>19men, >24 women);dyslipidemia;family history of premature cardiovascular disease;hypertension;obesity (BMI >30kg/m2)     Objective:    Today's Vitals   10/15/20 1031  BP: (!) 152/78  Pulse: 69  Temp: 98.1 F (36.7 C)  TempSrc: Oral  SpO2: 95%  Weight: 195 lb 12.3 oz (88.8 kg)  Height: 5\' 4"  (1.626 m)   Body mass index is 33.6 kg/m.  Advanced Directives 10/15/2020 08/26/2017 02/18/2014 02/10/2014 11/06/2012 10/29/2012  Does Patient Have a Medical Advance Directive? No No No No Patient does not have advance directive;Patient would not like information Patient does not have advance directive;Patient would not like information  Would patient like information on creating a medical advance directive? Yes (MAU/Ambulatory/Procedural Areas - Information given) No - Patient declined Yes - Educational materials given;No - patient declined information Yes Higher education careers adviser given - -  Pre-existing out of facility DNR order (yellow form or pink MOST form) - - - - No No    Current Medications (verified) Outpatient Encounter Medications as of 10/15/2020  Medication Sig   acetaminophen (TYLENOL) 325 MG tablet Take 2 tablets (650 mg total) by mouth every 6 (six) hours as needed for mild pain (or Fever >/= 101).   ALPRAZolam (XANAX) 0.25 MG tablet TAKE 1 TABLET BY MOUTH TWICE A DAY AS NEEDED   amLODipine-benazepril (LOTREL) 10-40 MG capsule Take 1 capsule by mouth daily.   cloNIDine (CATAPRES) 0.1 MG tablet Take 1 tablet (0.1 mg total) by mouth 2 (two) times daily.   furosemide (LASIX) 20 MG tablet TAKE 1 TABLET BY MOUTH IN  THE MORNING   gabapentin (NEURONTIN) 300 MG capsule TAKE 1 CAPSULE BY MOUTH 3  TIMES DAILY   hydrALAZINE (APRESOLINE) 100 MG tablet Take 100 mg by mouth 3 (three) times daily.    lansoprazole (PREVACID) 30 MG capsule Take 30 mg by mouth daily at 12 noon.   lovastatin (MEVACOR) 40 MG tablet TAKE 1 TABLET BY MOUTH AT  BEDTIME   nystatin (MYCOSTATIN) 100000 UNIT/ML suspension SWISH AND SPIT 5MLS 2 TIMES DAILY AFTER USING SYMBICORT.   oxybutynin (DITROPAN) 5 MG tablet TAKE 1 TABLET BY MOUTH 3  TIMES DAILY   pantoprazole (PROTONIX) 40 MG tablet TAKE 1 TABLET BY MOUTH  DAILY   PROAIR HFA 108 (90 Base) MCG/ACT inhaler TAKE 2 PUFFS BY MOUTH EVERY 6 HOURS AS NEEDED FOR WHEEZE OR SHORTNESS OF BREATH   SYMBICORT 160-4.5 MCG/ACT inhaler TAKE 2 PUFFS BY MOUTH TWICE A DAY   tiZANidine (ZANAFLEX) 4 MG tablet TAKE 1 TABLET BY MOUTH EVERY 6 HOURS AS NEEDED FOR MUSCLE SPASMS   traMADol (ULTRAM) 50 MG tablet TAKE 1-2 TABLETS (50-100 MG TOTAL) BY MOUTH EVERY 6 (SIX) HOURS AS NEEDED. FOR PAIN   traZODone (DESYREL) 50 MG tablet TAKE 1/2 TO 1 TABLET BY MOUTH AT BEDTIME AS NEEDED FOR SLEEP   No facility-administered encounter medications on file as of 10/15/2020.    Allergies (verified) Chlorthalidone   History: Past Medical History:  Diagnosis Date   ABNORMAL CHEST XRAY 11/05/2008   ACUTE URIS OF UNSPECIFIED SITE 10/22/2007   ALLERGIC RHINITIS 12/09/2006   ANEMIA 01/14/2009   Anxiety    Arthritis    ASTHMA 12/09/2006   Asthma    Blood transfusion without reported diagnosis    Cataract    bilateral -removed  CHF (congestive heart failure) (HCC)    Chronic pain syndrome 02/16/2009   COPD 12/09/2006   DEGENERATIVE JOINT DISEASE 12/09/2006   DIZZINESS 03/30/2010   DYSPNEA 01/27/2010   with heavy exertion   FREQUENCY, URINARY 03/30/2010   GERD 12/09/2006   Glossitis 6/81/1572   HELICOBACTER PYLORI GASTRITIS, HX OF 12/09/2006   HIATAL HERNIA 03/01/2010   History of hiatal hernia    HYPERLIPIDEMIA 04/18/2007   HYPERSOMNIA 08/31/2008   HYPERTENSION 12/09/2006   INSOMNIA-SLEEP DISORDER-UNSPEC 11/05/2008   LOW BACK PAIN 12/09/2006   Morbid obesity ([place]) 12/09/2006   Nonspecific (abnormal)  findings on radiological and other examination of body structure 11/05/2008   OSTEOPOROSIS 12/09/2006   OVERACTIVE BLADDER 04/14/2008   PALPITATIONS, RECURRENT 01/27/2010   Stroke ([place])    pt. stated light one years ago   SYNCOPE 11/05/2008   TRANSIENT ISCHEMIC ATTACK, HX OF 12/09/2006   Past Surgical History:  Procedure Laterality Date   COLONOSCOPY     LUMBAR DISC SURGERY     TONSILLECTOMY     as a child   TOTAL HIP ARTHROPLASTY Right 11/06/2012   Procedure: RIGHT TOTAL HIP ARTHROPLASTY WITH ACETABULAR AUTOGRAFT;  Surgeon: Gearlean Alf, MD;  Location: [facility];  Service: Orthopedics;  Laterality: Right;   TOTAL HIP ARTHROPLASTY Left 02/18/2014   Procedure: LEFT TOTAL HIP ARTHROPLASTY;  Surgeon: Gearlean Alf, MD;  Location: [facility];  Service: Orthopedics;  Laterality: Left;   TUBAL LIGATION     Family History  Problem Relation Age of Onset   Heart disease Mother        Had pacemaker   Lung cancer Father    Hypertension Brother    Diabetes Neg Hx    Colon cancer Neg Hx    Colon polyps Neg Hx    Kidney disease Neg Hx    Gallbladder disease Neg Hx    Esophageal cancer Neg Hx    Allergic rhinitis Neg Hx    Angioedema Neg Hx    Asthma Neg Hx    Atopy Neg Hx    Eczema Neg Hx    Immunodeficiency Neg Hx    Urticaria Neg Hx    Social History   Socioeconomic History   Marital status: Married    Spouse name: Not on file   Number of children: 10   Years of education: Not on file   Highest education level: Not on file  Occupational History   Occupation: Retired  Tobacco Use   Smoking status: Former    Packs/day: 0.50    Years: 25.00    Pack years: 12.50    Types: Cigarettes    Quit date: 10/29/1972    Years since quitting: 47.9   Smokeless tobacco: Never  Vaping Use   Vaping Use: Never used  Substance and Sexual Activity   Alcohol use: No    Alcohol/week: 0.0 standard drinks   Drug use: No   Sexual activity: Not on file  Other Topics Concern   Not on file  Social  History Narrative   Not on file   Social Determinants of Health   Financial Resource Strain: Low Risk    Difficulty of Paying Living Expenses: Not hard at all  Food Insecurity: No Food Insecurity   Worried About Charity fundraiser in the Last Year: Never true   Ran Out of Food in the Last Year: Never true  Transportation Needs: No Transportation Needs   Lack of Transportation (Medical): No   Lack of Transportation (Non-Medical):  No  Physical Activity: Inactive   Days of Exercise per Week: 0 days   Minutes of Exercise per Session: 0 min  Stress: No Stress Concern Present   Feeling of Stress : Not at all  Social Connections: Moderately Integrated   Frequency of Communication with Friends and Family: More than three times a week   Frequency of Social Gatherings with Friends and Family: More than three times a week   Attends Religious Services: More than 4 times per year   Active Member of Clubs or Organizations: No   Attends Music therapist: More than 4 times per year   Marital Status: Widowed    Tobacco Counseling Counseling given: Not Answered   Clinical Intake:  Pre-visit preparation completed: Yes  Pain : No/denies pain     BMI - recorded: 33.6 Nutritional Status: BMI > 30  Obese Nutritional Risks: None Diabetes: No  How often do you need to have someone help you when you read instructions, pamphlets, or other written materials from your doctor or pharmacy?: 1 - Never What is the last grade level you completed in school?: 10th grade  Diabetic? no  Interpreter Needed?: No  Information entered by :: Lisette Abu, LPN   Activities of Daily Living In your present state of health, do you have any difficulty performing the following activities: 10/15/2020 06/21/2020  Hearing? N N  Vision? N N  Difficulty concentrating or making decisions? N N  Walking or climbing stairs? Y Y  Dressing or bathing? N N  Doing errands, shopping? Tempie Donning  Preparing  Food and eating ? N -  Using the Toilet? N -  In the past six months, have you accidently leaked urine? N -  Do you have problems with loss of bowel control? N -  Managing your Medications? N -  Managing your Finances? N -  Housekeeping or managing your Housekeeping? N -  Some recent data might be hidden    Patient Care Team: Biagio Borg, MD as PCP - General  Indicate any recent Medical Services you may have received from other than [facility] providers in the past year (date may be approximate).     Assessment:   This is a routine wellness examination for De[place]ae.  Hearing/Vision screen No results found.  Dietary issues and exercise activities discussed: Current Exercise Habits: The patient does not participate in regular exercise at present, Exercise limited by: respiratory conditions(s);cardiac condition(s);orthopedic condition(s)   Goals Addressed             This Visit's Progress    Patient Stated       No healthcare goals for this year.       Depression Screen PHQ 2/9 Scores 10/15/2020 06/21/2020 10/15/2019 02/06/2019 07/18/2017 05/31/2016 08/06/2015  PHQ - 2 Score 0 0 1 0 0 0 0    Fall Risk Fall Risk  10/15/2020 06/21/2020 10/15/2019 02/06/2019 07/18/2017  Falls in the past year? 0 0 0 0 No  Number falls in past yr: 0 - - 0 -  Injury with Fall? 0 - - 0 -  Risk for fall due to : Impaired balance/gait - - Impaired balance/gait -  Follow up - - - Falls evaluation completed -    FALL RISK PREVENTION PERTAINING TO THE HOME:  Any stairs in or around the home? No  If so, are there any without handrails? No  Home free of loose throw rugs in walkways, pet beds, electrical cords, etc? Yes  Adequate lighting  in your home to reduce risk of falls? Yes   ASSISTIVE DEVICES UTILIZED TO PREVENT FALLS:  Life alert? No  Use of a cane, walker or w/c? Yes  Grab bars in the bathroom? No  Shower chair or bench in shower? No  Elevated toilet seat or a handicapped toilet? No   TIMED UP  AND GO:  Was the test performed? Yes .  Length of time to ambulate 10 feet: 10 sec.   Gait slow and steady with assistive device  Cognitive Function:        Immunizations Immunization History  Administered Date(s) Administered   Influenza Split 04/07/2011, 02/28/2012   Influenza Whole 04/15/2004, 04/18/2007, 01/27/2010   Influenza, High Dose Seasonal PF 02/04/2018, 12/10/2018   Influenza,inj,Quad PF,6+ Mos 03/16/2020   Influenza,inj,quad, With Preservative 01/29/2017   PFIZER(Purple Top)SARS-COV-2 Vaccination 09/14/2019, 10/12/2019   Pneumococcal Conjugate-13 12/03/2013   Pneumococcal Polysaccharide-23 05/02/2003, 09/29/2008, 12/10/2018   Tdap 04/07/2011    TDAP status: Up to date  Flu Vaccine status: Up to date  Pneumococcal vaccine status: Up to date  Covid-19 vaccine status: Completed vaccines  Qualifies for Shingles Vaccine? Yes   Zostavax completed No   Shingrix Completed?: No.    Education has been provided regarding the importance of this vaccine. Patient has been advised to call insurance company to determine out of pocket expense if they have not yet received this vaccine. Advised may also receive vaccine at local pharmacy or Health Dept. Verbalized acceptance and understanding.  Screening Tests Health Maintenance  Topic Date Due   Zoster Vaccines- Shingrix (1 of 2) Never done   INFLUENZA VACCINE  11/29/2020   TETANUS/TDAP  04/06/2021   DEXA SCAN  Completed   PNA vac Low Risk Adult  Completed   HPV VACCINES  Aged Out   COVID-19 Vaccine  Discontinued    Health Maintenance  Health Maintenance Due  Topic Date Due   Zoster Vaccines- Shingrix (1 of 2) Never done    Colorectal cancer screening: No longer required.   Mammogram status: No longer required due to patient refusal.  Bone Density status: Completed 06/19/2012. Results reflect: Bone density results: OSTEOPENIA. Repeat every 2 years.  Lung Cancer Screening: (Low Dose CT Chest recommended if Age  79-80 years, 30 pack-year currently smoking OR have quit w/in 15years.) does not qualify.   Lung Cancer Screening Referral: no  Additional Screening:  Hepatitis C Screening: does not qualify; Completed no  Vision Screening: Recommended annual ophthalmology exams for early detection of glaucoma and other disorders of the eye. Is the patient up to date with their annual eye exam?  Yes  Who is the provider or what is the name of the office in which the patient attends annual eye exams? [place] [facility] If pt is not established with a provider, would they like to be referred to a provider to establish care? No .   Dental Screening: Recommended annual dental exams for proper oral hygiene  Community Resource Referral / Chronic Care Management: CRR required this visit?  No   CCM required this visit?  No      Plan:     I have personally reviewed and noted the following in the patient's chart:   Medical and social history Use of alcohol, tobacco or illicit drugs  Current medications and supplements including opioid prescriptions.  Functional ability and status Nutritional status Physical activity Advanced directives List of other physicians Hospitalizations, surgeries, and ER visits in previous 12 months Vitals Screenings  to include cognitive, depression, and falls Referrals and appointments  In addition, I have reviewed and discussed with patient certain preventive protocols, quality metrics, and best practice recommendations. A written personalized care plan for preventive services as well as general preventive health recommendations were provided to patient.     Sheral Flow, LPN   2/48/1859   Nurse Notes: n/a

## 2020-10-15 NOTE — Patient Instructions (Signed)
Ok to use the Voltaren gel OTC for joint pain  Please continue all other medications as before, and refills have been done if requested.  Please have the pharmacy call with any other refills you may need.  Please continue your efforts at being more active, low cholesterol diet, and weight control.  Please keep your appointments with your specialists as you may have planned - lab work and kidney doctor  Please make an Appointment to return in 6 months, or sooner if needed

## 2020-10-18 ENCOUNTER — Encounter: Payer: Self-pay | Admitting: Internal Medicine

## 2020-10-18 DIAGNOSIS — J9611 Chronic respiratory failure with hypoxia: Secondary | ICD-10-CM | POA: Insufficient documentation

## 2020-10-18 NOTE — Assessment & Plan Note (Signed)
Stable, cont home o2 2L

## 2020-10-18 NOTE — Assessment & Plan Note (Signed)
Lab Results  Component Value Date   LDLCALC 96 06/21/2020   Mild uncontrolled, goal ldl < 70,, pt declines change of statin lovastatin 40   Current Outpatient Medications (Cardiovascular):  .  amLODipine-benazepril (LOTREL) 10-40 MG capsule, Take 1 capsule by mouth daily. .  cloNIDine (CATAPRES) 0.1 MG tablet, Take 1 tablet (0.1 mg total) by mouth 2 (two) times daily. .  furosemide (LASIX) 20 MG tablet, TAKE 1 TABLET BY MOUTH IN  THE MORNING .  hydrALAZINE (APRESOLINE) 100 MG tablet, Take 100 mg by mouth 3 (three) times daily. Marland Kitchen  lovastatin (MEVACOR) 40 MG tablet, TAKE 1 TABLET BY MOUTH AT  BEDTIME  Current Outpatient Medications (Respiratory):  .  PROAIR HFA 108 (90 Base) MCG/ACT inhaler, TAKE 2 PUFFS BY MOUTH EVERY 6 HOURS AS NEEDED FOR WHEEZE OR SHORTNESS OF BREATH .  SYMBICORT 160-4.5 MCG/ACT inhaler, TAKE 2 PUFFS BY MOUTH TWICE A DAY  Current Outpatient Medications (Analgesics):  .  acetaminophen (TYLENOL) 325 MG tablet, Take 2 tablets (650 mg total) by mouth every 6 (six) hours as needed for mild pain (or Fever >/= 101). .  traMADol (ULTRAM) 50 MG tablet, TAKE 1-2 TABLETS (50-100 MG TOTAL) BY MOUTH EVERY 6 (SIX) HOURS AS NEEDED. FOR PAIN   Current Outpatient Medications (Other):  Marland Kitchen  ALPRAZolam (XANAX) 0.25 MG tablet, TAKE 1 TABLET BY MOUTH TWICE A DAY AS NEEDED .  gabapentin (NEURONTIN) 300 MG capsule, TAKE 1 CAPSULE BY MOUTH 3  TIMES DAILY .  lansoprazole (PREVACID) 30 MG capsule, Take 30 mg by mouth daily at 12 noon. .  nystatin (MYCOSTATIN) 100000 UNIT/ML suspension, SWISH AND SPIT 5MLS 2 TIMES DAILY AFTER USING SYMBICORT. Marland Kitchen  oxybutynin (DITROPAN) 5 MG tablet, TAKE 1 TABLET BY MOUTH 3  TIMES DAILY .  pantoprazole (PROTONIX) 40 MG tablet, TAKE 1 TABLET BY MOUTH  DAILY .  tiZANidine (ZANAFLEX) 4 MG tablet, TAKE 1 TABLET BY MOUTH EVERY 6 HOURS AS NEEDED FOR MUSCLE SPASMS .  traZODone (DESYREL) 50 MG tablet, TAKE 1/2 TO 1 TABLET BY MOUTH AT BEDTIME AS NEEDED FOR SLEEP

## 2020-10-18 NOTE — Assessment & Plan Note (Signed)
Lab Results  Component Value Date   HGBA1C 5.7 06/21/2020   Stable, pt to continue current medical treatment  - diet

## 2020-10-18 NOTE — Assessment & Plan Note (Signed)
Mild uncontrolled, for topical Volt gel prn,  to f/u any worsening symptoms or concerns

## 2020-10-18 NOTE — Assessment & Plan Note (Signed)
BP Readings from Last 3 Encounters:  10/15/20 (!) 152/78  10/15/20 (!) 152/78  09/13/20 (!) 142/72   uncontrolled, pt to continue medical treatment including increased hydralazine 100 tid to start soon   Current Outpatient Medications (Cardiovascular):  .  amLODipine-benazepril (LOTREL) 10-40 MG capsule, Take 1 capsule by mouth daily. .  cloNIDine (CATAPRES) 0.1 MG tablet, Take 1 tablet (0.1 mg total) by mouth 2 (two) times daily. .  furosemide (LASIX) 20 MG tablet, TAKE 1 TABLET BY MOUTH IN  THE MORNING .  hydrALAZINE (APRESOLINE) 100 MG tablet, Take 100 mg by mouth 3 (three) times daily. Marland Kitchen  lovastatin (MEVACOR) 40 MG tablet, TAKE 1 TABLET BY MOUTH AT  BEDTIME  Current Outpatient Medications (Respiratory):  .  PROAIR HFA 108 (90 Base) MCG/ACT inhaler, TAKE 2 PUFFS BY MOUTH EVERY 6 HOURS AS NEEDED FOR WHEEZE OR SHORTNESS OF BREATH .  SYMBICORT 160-4.5 MCG/ACT inhaler, TAKE 2 PUFFS BY MOUTH TWICE A DAY  Current Outpatient Medications (Analgesics):  .  acetaminophen (TYLENOL) 325 MG tablet, Take 2 tablets (650 mg total) by mouth every 6 (six) hours as needed for mild pain (or Fever >/= 101). .  traMADol (ULTRAM) 50 MG tablet, TAKE 1-2 TABLETS (50-100 MG TOTAL) BY MOUTH EVERY 6 (SIX) HOURS AS NEEDED. FOR PAIN   Current Outpatient Medications (Other):  Marland Kitchen  ALPRAZolam (XANAX) 0.25 MG tablet, TAKE 1 TABLET BY MOUTH TWICE A DAY AS NEEDED .  gabapentin (NEURONTIN) 300 MG capsule, TAKE 1 CAPSULE BY MOUTH 3  TIMES DAILY .  lansoprazole (PREVACID) 30 MG capsule, Take 30 mg by mouth daily at 12 noon. .  nystatin (MYCOSTATIN) 100000 UNIT/ML suspension, SWISH AND SPIT 5MLS 2 TIMES DAILY AFTER USING SYMBICORT. Marland Kitchen  oxybutynin (DITROPAN) 5 MG tablet, TAKE 1 TABLET BY MOUTH 3  TIMES DAILY .  pantoprazole (PROTONIX) 40 MG tablet, TAKE 1 TABLET BY MOUTH  DAILY .  tiZANidine (ZANAFLEX) 4 MG tablet, TAKE 1 TABLET BY MOUTH EVERY 6 HOURS AS NEEDED FOR MUSCLE SPASMS .  traZODone (DESYREL) 50 MG tablet, TAKE 1/2  TO 1 TABLET BY MOUTH AT BEDTIME AS NEEDED FOR SLEEP

## 2020-10-27 DIAGNOSIS — N1832 Chronic kidney disease, stage 3b: Secondary | ICD-10-CM | POA: Diagnosis not present

## 2020-10-29 DIAGNOSIS — R269 Unspecified abnormalities of gait and mobility: Secondary | ICD-10-CM | POA: Diagnosis not present

## 2020-10-29 DIAGNOSIS — Z471 Aftercare following joint replacement surgery: Secondary | ICD-10-CM | POA: Diagnosis not present

## 2020-10-29 DIAGNOSIS — M6281 Muscle weakness (generalized): Secondary | ICD-10-CM | POA: Diagnosis not present

## 2020-10-29 DIAGNOSIS — Z96642 Presence of left artificial hip joint: Secondary | ICD-10-CM | POA: Diagnosis not present

## 2020-11-15 ENCOUNTER — Other Ambulatory Visit: Payer: Self-pay | Admitting: Internal Medicine

## 2020-11-24 ENCOUNTER — Encounter: Payer: Self-pay | Admitting: Internal Medicine

## 2020-11-25 MED ORDER — CLONIDINE HCL 0.1 MG PO TABS: 0.1000 mg | ORAL_TABLET | Freq: Two times a day (BID) | ORAL | 3 refills | Status: DC

## 2020-11-29 DIAGNOSIS — M6281 Muscle weakness (generalized): Secondary | ICD-10-CM | POA: Diagnosis not present

## 2020-11-29 DIAGNOSIS — Z96642 Presence of left artificial hip joint: Secondary | ICD-10-CM | POA: Diagnosis not present

## 2020-11-29 DIAGNOSIS — R269 Unspecified abnormalities of gait and mobility: Secondary | ICD-10-CM | POA: Diagnosis not present

## 2020-11-29 DIAGNOSIS — Z471 Aftercare following joint replacement surgery: Secondary | ICD-10-CM | POA: Diagnosis not present

## 2020-12-01 ENCOUNTER — Ambulatory Visit (INDEPENDENT_AMBULATORY_CARE_PROVIDER_SITE_OTHER): Payer: Medicare Other | Admitting: Internal Medicine

## 2020-12-01 ENCOUNTER — Encounter: Payer: Self-pay | Admitting: Internal Medicine

## 2020-12-01 ENCOUNTER — Other Ambulatory Visit: Payer: Self-pay

## 2020-12-01 VITALS — BP 142/78 | HR 87 | Ht 64.0 in | Wt 195.0 lb

## 2020-12-01 DIAGNOSIS — E785 Hyperlipidemia, unspecified: Secondary | ICD-10-CM | POA: Diagnosis not present

## 2020-12-01 DIAGNOSIS — E559 Vitamin D deficiency, unspecified: Secondary | ICD-10-CM

## 2020-12-01 DIAGNOSIS — K2100 Gastro-esophageal reflux disease with esophagitis, without bleeding: Secondary | ICD-10-CM

## 2020-12-01 DIAGNOSIS — H9311 Tinnitus, right ear: Secondary | ICD-10-CM

## 2020-12-01 DIAGNOSIS — E538 Deficiency of other specified B group vitamins: Secondary | ICD-10-CM | POA: Diagnosis not present

## 2020-12-01 DIAGNOSIS — K649 Unspecified hemorrhoids: Secondary | ICD-10-CM | POA: Insufficient documentation

## 2020-12-01 DIAGNOSIS — R7303 Prediabetes: Secondary | ICD-10-CM | POA: Diagnosis not present

## 2020-12-01 DIAGNOSIS — R42 Dizziness and giddiness: Secondary | ICD-10-CM

## 2020-12-01 DIAGNOSIS — I10 Essential (primary) hypertension: Secondary | ICD-10-CM | POA: Diagnosis not present

## 2020-12-01 DIAGNOSIS — K2970 Gastritis, unspecified, without bleeding: Secondary | ICD-10-CM | POA: Diagnosis not present

## 2020-12-01 LAB — BASIC METABOLIC PANEL
BUN: 11 mg/dL (ref 6–23)
CO2: 28 mEq/L (ref 19–32)
Calcium: 10.9 mg/dL — ABNORMAL HIGH (ref 8.4–10.5)
Chloride: 98 mEq/L (ref 96–112)
Creatinine, Ser: 0.96 mg/dL (ref 0.40–1.20)
GFR: 55.36 mL/min — ABNORMAL LOW (ref >60.00)
Glucose, Bld: 92 mg/dL (ref 70–99)
Potassium: 4.1 mEq/L (ref 3.5–5.1)
Sodium: 135 mEq/L (ref 135–145)

## 2020-12-01 LAB — CBC WITH DIFFERENTIAL/PLATELET
Basophils Absolute: 0 10*3/uL (ref 0.0–0.1)
Basophils Relative: 0.4 % (ref 0.0–3.0)
Eosinophils Absolute: 0.1 10*3/uL (ref 0.0–0.7)
Eosinophils Relative: 2.1 % (ref 0.0–5.0)
HCT: 37 % (ref 36.0–46.0)
Hemoglobin: 11.9 g/dL — ABNORMAL LOW (ref 12.0–15.0)
Lymphocytes Relative: 31.1 % (ref 12.0–46.0)
Lymphs Abs: 1.9 10*3/uL (ref 0.7–4.0)
MCHC: 32.2 g/dL (ref 30.0–36.0)
MCV: 83.6 fl (ref 78.0–100.0)
Monocytes Absolute: 0.9 10*3/uL (ref 0.1–1.0)
Monocytes Relative: 13.9 % — ABNORMAL HIGH (ref 3.0–12.0)
Neutro Abs: 3.2 10*3/uL (ref 1.4–7.7)
Neutrophils Relative %: 52.5 % (ref 43.0–77.0)
Platelets: 319 10*3/uL (ref 150.0–400.0)
RBC: 4.43 Mil/uL (ref 3.87–5.11)
RDW: 15 % (ref 11.5–15.5)
WBC: 6.2 10*3/uL (ref 4.0–10.5)

## 2020-12-01 LAB — LIPID PANEL
Cholesterol: 164 mg/dL (ref 0–200)
HDL: 71.3 mg/dL (ref >39.00)
LDL Cholesterol: 78 mg/dL (ref 0–99)
NonHDL: 92.37
Total CHOL/HDL Ratio: 2
Triglycerides: 70 mg/dL (ref 0.0–149.0)
VLDL: 14 mg/dL (ref 0.0–40.0)

## 2020-12-01 LAB — VITAMIN B12: Vitamin B-12: 255 pg/mL (ref 211–911)

## 2020-12-01 LAB — HEPATIC FUNCTION PANEL
ALT: 11 U/L (ref 0–35)
AST: 16 U/L (ref 0–37)
Albumin: 4.3 g/dL (ref 3.5–5.2)
Alkaline Phosphatase: 67 U/L (ref 39–117)
Bilirubin, Direct: 0.1 mg/dL (ref 0.0–0.3)
Total Bilirubin: 0.3 mg/dL (ref 0.2–1.2)
Total Protein: 8.6 g/dL — ABNORMAL HIGH (ref 6.0–8.3)

## 2020-12-01 LAB — VITAMIN D 25 HYDROXY (VIT D DEFICIENCY, FRACTURES): VITD: 41.53 ng/mL (ref 30.00–100.00)

## 2020-12-01 LAB — HEMOGLOBIN A1C: Hgb A1c MFr Bld: 5.6 % (ref 4.6–6.5)

## 2020-12-01 LAB — H. PYLORI ANTIBODY, IGG: H Pylori IgG: NEGATIVE

## 2020-12-01 LAB — TSH: TSH: 1.38 u[IU]/mL (ref 0.35–5.50)

## 2020-12-01 MED ORDER — SUCRALFATE 1 G PO TABS: 1.0000 g | ORAL_TABLET | Freq: Three times a day (TID) | ORAL | 0 refills | Status: DC

## 2020-12-01 MED ORDER — HYDROCORTISONE ACETATE 25 MG RE SUPP: 25.0000 mg | Freq: Two times a day (BID) | RECTAL | 1 refills | Status: AC

## 2020-12-01 MED ORDER — MECLIZINE HCL 12.5 MG PO TABS: 12.5000 mg | ORAL_TABLET | Freq: Three times a day (TID) | ORAL | 1 refills | Status: DC | PRN

## 2020-12-01 MED ORDER — PANTOPRAZOLE SODIUM 40 MG PO TBEC: 40.0000 mg | DELAYED_RELEASE_TABLET | Freq: Two times a day (BID) | ORAL | 1 refills | Status: DC

## 2020-12-01 NOTE — Patient Instructions (Signed)
Please take all new medication as prescribed  - the meclizine for dizziness, and the suppository for hemorrhoids  Please take all new medication as prescribed - the carafate to coat the stomach for 1 month  Ok to increase the protonix to twice per day for 1 month, then go back to once per day again  Please continue all other medications as before, and refills have been done if requested.  Please have the pharmacy call with any other refills you may need.  Please continue your efforts at being more active, low cholesterol diet, and weight control.  Please keep your appointments with your specialists as you may have planned  Please go to the LAB at the blood drawing area for the tests to be done  You will be contacted by phone if any changes need to be made immediately.  Otherwise, you will receive a letter about your results with an explanation, but please check with MyChart first.  Please remember to sign up for MyChart if you have not done so, as this will be important to you in the future with finding out test results, communicating by private email, and scheduling acute appointments online when needed.  Please make an Appointment to return in 6 months, or sooner if needed

## 2020-12-01 NOTE — Assessment & Plan Note (Signed)
Lab Results  Component Value Date   LDLCALC 96 06/21/2020   Stable, pt to continue current statin lovastatin

## 2020-12-01 NOTE — Assessment & Plan Note (Signed)
Lab Results  Component Value Date   HGBA1C 5.7 06/21/2020   Stable, pt to continue current medical treatment  - diet

## 2020-12-01 NOTE — Assessment & Plan Note (Signed)
BP Readings from Last 3 Encounters:  12/01/20 (!) 142/78  10/15/20 (!) 152/78  10/15/20 (!) 152/78   Mild uncontrolled here, pt to continue medical treatment lotrel, hydralazine, catapres as declines change in tx today

## 2020-12-01 NOTE — Assessment & Plan Note (Signed)
Also for carafate 1 gm qid for 1 mo, consider GI if not improved

## 2020-12-01 NOTE — Assessment & Plan Note (Signed)
Uncontrolled, D/w pt, unfortunately no specific etiology or tx available today

## 2020-12-01 NOTE — Assessment & Plan Note (Signed)
For increased protonix 40 bid for 1 mo, then 40 qd

## 2020-12-01 NOTE — Assessment & Plan Note (Signed)
Ok for [employer] asd,  to f/u any worsening symptoms or concerns

## 2020-12-01 NOTE — Assessment & Plan Note (Signed)
Mild uncontrolled, suspected peripheral vertigo such as BPPV, for meclizine prn

## 2020-12-01 NOTE — Progress Notes (Signed)
Patient ID: Wendy Torres, female   DOB: [birth year], 80 y.o.   MRN: [id number]        Chief Complaint: follow up HTN,tinnitus, vertigo, hemorrhoid and epigastric pain       HPI:  GUENEVERE Torres is a 80 y.o. female here with daughter, c/o 1 mo worsening epigastric pain with some bloating and worsening reflux; but  Pt denies fever, wt loss, night sweats, loss of appetite, or other constitutional symptoms  Denies worsening dysphagia, n/v, bowel change.  Has hx of similar, and EGD with gastritis in 2016.  Also has positional dizziness and room spinning sometimes for several days not sure if related, Pt denies new neurological symptoms such as new headache, or facial or extremity weakness or numbness  BP has been < 140/90 at home.  Also has hemorrhoid discomfort acting up without bleeding in the past 2 wks off and on, mild  Also has mild intermittent right ear ringing but no worsening hearing loss.         Wt Readings from Last 3 Encounters:  12/01/20 195 lb (88.5 kg)  10/15/20 195 lb 12.3 oz (88.8 kg)  10/15/20 195 lb 12.8 oz (88.8 kg)   BP Readings from Last 3 Encounters:  12/01/20 (!) 142/78  10/15/20 (!) 152/78  10/15/20 (!) 152/78         Past Medical History:  Diagnosis Date   ABNORMAL CHEST XRAY 11/05/2008   ACUTE URIS OF UNSPECIFIED SITE 10/22/2007   ALLERGIC RHINITIS 12/09/2006   ANEMIA 01/14/2009   Anxiety    Arthritis    ASTHMA 12/09/2006   Asthma    Blood transfusion without reported diagnosis    Cataract    bilateral -removed   CHF (congestive heart failure) ([place])    Chronic pain syndrome 02/16/2009   COPD 12/09/2006   DEGENERATIVE JOINT DISEASE 12/09/2006   DIZZINESS 03/30/2010   DYSPNEA 01/27/2010   with heavy exertion   FREQUENCY, URINARY 03/30/2010   GERD 12/09/2006   Glossitis [id number]   HELICOBACTER PYLORI GASTRITIS, HX OF 12/09/2006   HIATAL HERNIA 03/01/2010   History of hiatal hernia    HYPERLIPIDEMIA 04/18/2007   HYPERSOMNIA 08/31/2008   HYPERTENSION 12/09/2006    INSOMNIA-SLEEP DISORDER-UNSPEC 11/05/2008   LOW BACK PAIN 12/09/2006   Morbid obesity ([place]) 12/09/2006   Nonspecific (abnormal) findings on radiological and other examination of body structure 11/05/2008   OSTEOPOROSIS 12/09/2006   OVERACTIVE BLADDER 04/14/2008   PALPITATIONS, RECURRENT 01/27/2010   Stroke ([place])    pt. stated light one years ago   SYNCOPE 11/05/2008   TRANSIENT ISCHEMIC ATTACK, HX OF 12/09/2006   Past Surgical History:  Procedure Laterality Date   COLONOSCOPY     LUMBAR DISC SURGERY     TONSILLECTOMY     as a child   TOTAL HIP ARTHROPLASTY Right 11/06/2012   Procedure: RIGHT TOTAL HIP ARTHROPLASTY WITH ACETABULAR AUTOGRAFT;  Surgeon: Gearlean Alf, MD;  Location: [facility];  Service: Orthopedics;  Laterality: Right;   TOTAL HIP ARTHROPLASTY Left 02/18/2014   Procedure: LEFT TOTAL HIP ARTHROPLASTY;  Surgeon: Gearlean Alf, MD;  Location: [facility];  Service: Orthopedics;  Laterality: Left;   TUBAL LIGATION      reports that she quit smoking about 48 years ago. Her smoking use included cigarettes. She has a 12.50 pack-year smoking history. She has never used smokeless tobacco. She reports that she does not drink alcohol and does not use drugs. family history includes Heart disease in her mother; Hypertension in  her brother; Lung cancer in her father. Allergies  Allergen Reactions   Chlorthalidone    Current Outpatient Medications on File Prior to Visit  Medication Sig Dispense Refill   acetaminophen (TYLENOL) 325 MG tablet Take 2 tablets (650 mg total) by mouth every 6 (six) hours as needed for mild pain (or Fever >/= 101). 40 tablet 0   ALPRAZolam (XANAX) 0.25 MG tablet TAKE 1 TABLET BY MOUTH TWICE A DAY AS NEEDED 60 tablet 2   amLODipine-benazepril (LOTREL) 10-40 MG capsule Take 1 capsule by mouth daily. 90 capsule 3   cloNIDine (CATAPRES) 0.1 MG tablet Take 1 tablet (0.1 mg total) by mouth 2 (two) times daily. 180 tablet 3   furosemide (LASIX) 20 MG tablet TAKE 1 TABLET BY  MOUTH IN  THE MORNING 90 tablet 3   gabapentin (NEURONTIN) 300 MG capsule TAKE 1 CAPSULE BY MOUTH 3  TIMES DAILY 270 capsule 1   hydrALAZINE (APRESOLINE) 100 MG tablet Take 100 mg by mouth 3 (three) times daily.     lovastatin (MEVACOR) 40 MG tablet TAKE 1 TABLET BY MOUTH AT  BEDTIME 90 tablet 3   nystatin (MYCOSTATIN) 100000 UNIT/ML suspension SWISH AND SPIT 5MLS 2 TIMES DAILY AFTER USING SYMBICORT. 300 mL 5   oxybutynin (DITROPAN) 5 MG tablet TAKE 1 TABLET BY MOUTH 3  TIMES DAILY 270 tablet 3   PROAIR HFA 108 (90 Base) MCG/ACT inhaler TAKE 2 PUFFS BY MOUTH EVERY 6 HOURS AS NEEDED FOR WHEEZE OR SHORTNESS OF BREATH 8.5 each 2   SYMBICORT 160-4.5 MCG/ACT inhaler TAKE 2 PUFFS BY MOUTH TWICE A DAY 10.2 each 5   tiZANidine (ZANAFLEX) 4 MG tablet TAKE 1 TABLET BY MOUTH EVERY 6 HOURS AS NEEDED FOR MUSCLE SPASMS 50 tablet 1   traMADol (ULTRAM) 50 MG tablet TAKE 1-2 TABLETS (50-100 MG TOTAL) BY MOUTH EVERY 6 (SIX) HOURS AS NEEDED. FOR PAIN 120 tablet 2   traZODone (DESYREL) 50 MG tablet TAKE 1/2 TO 1 TABLET BY MOUTH AT BEDTIME AS NEEDED FOR SLEEP 90 tablet 1   hydrALAZINE (APRESOLINE) 25 MG tablet Take 25 mg by mouth 3 (three) times daily.     RESTASIS 0.05 % ophthalmic emulsion      No current facility-administered medications on file prior to visit.        ROS:  All others reviewed and negative.  Objective        PE:  BP (!) 142/78 (BP Location: Left Arm, Patient Position: Sitting, Cuff Size: Large)   Pulse 87   Ht '5\' 4"'$  (1.626 m)   Wt 195 lb (88.5 kg)   SpO2 90%   BMI 33.47 kg/m                 Constitutional: Pt appears in NAD               HENT: Head: NCAT.                Right Ear: External ear normal.                 Left Ear: External ear normal.                Eyes: . Pupils are equal, round, and reactive to light. Conjunctivae and EOM are normal               Nose: without d/c or deformity               Neck: Neck supple. Wendy Torres  normal ROM               Cardiovascular: Normal rate  and regular rhythm.                 Pulmonary/Chest: Effort normal and breath sounds without rales or wheezing.                Abd:  Soft, ND, + BS, no organomegaly, mild epigsatric tender, no guarding or rebound               Neurological: Pt is alert. At baseline orientation, motor grossly intact               Skin: Skin is warm. No rashes, no other new lesions, LE edema - none               Psychiatric: Pt behavior is normal without agitation   Micro: none  Cardiac tracings I have personally interpreted today:  none  Pertinent Radiological findings (summarize): none   Lab Results  Component Value Date   WBC 6.8 06/21/2020   HGB 11.9 (L) 06/21/2020   HCT 36.0 06/21/2020   PLT 334.0 06/21/2020   GLUCOSE 85 06/21/2020   CHOL 184 06/21/2020   TRIG 70.0 06/21/2020   HDL 73.80 06/21/2020   LDLCALC 96 06/21/2020   ALT 11 06/21/2020   AST 17 06/21/2020   NA 134 (L) 06/21/2020   K 4.2 06/21/2020   CL 94 (L) 06/21/2020   CREATININE 1.02 06/21/2020   BUN 10 06/21/2020   CO2 32 06/21/2020   TSH 0.59 06/21/2020   INR 1.02 02/10/2014   HGBA1C 5.7 06/21/2020   Assessment/Plan:  ANNALINA MONTEZUMA is a 80 y.o. Black or African American [2] female with  has a past medical history of ABNORMAL CHEST XRAY (11/05/2008), ACUTE URIS OF UNSPECIFIED SITE (10/22/2007), ALLERGIC RHINITIS (12/09/2006), ANEMIA (01/14/2009), Anxiety, Arthritis, ASTHMA (12/09/2006), Asthma, Blood transfusion without reported diagnosis, Cataract, CHF (congestive heart failure) ([place]), Chronic pain syndrome (02/16/2009), COPD (12/09/2006), DEGENERATIVE JOINT DISEASE (12/09/2006), DIZZINESS (03/30/2010), DYSPNEA (01/27/2010), FREQUENCY, URINARY (03/30/2010), GERD (12/09/2006), Glossitis ([id number]), HELICOBACTER PYLORI GASTRITIS, HX OF (12/09/2006), HIATAL HERNIA (03/01/2010), History of hiatal hernia, HYPERLIPIDEMIA (04/18/2007), HYPERSOMNIA (08/31/2008), HYPERTENSION (12/09/2006), INSOMNIA-SLEEP DISORDER-UNSPEC (11/05/2008), LOW BACK PAIN  (12/09/2006), Morbid obesity ([place]) (12/09/2006), Nonspecific (abnormal) findings on radiological and other examination of body structure (11/05/2008), OSTEOPOROSIS (12/09/2006), OVERACTIVE BLADDER (04/14/2008), PALPITATIONS, RECURRENT (01/27/2010), Stroke ([place]), SYNCOPE (11/05/2008), and TRANSIENT ISCHEMIC ATTACK, HX OF (12/09/2006).  Prediabetes Lab Results  Component Value Date   HGBA1C 5.7 06/21/2020   Stable, pt to continue current medical treatment  - diet   GERD For increased protonix 40 bid for 1 mo, then 40 qd  Gastritis Also for carafate 1 gm qid for 1 mo, consider GI if not improved  Essential hypertension BP Readings from Last 3 Encounters:  12/01/20 (!) 142/78  10/15/20 (!) 152/78  10/15/20 (!) 152/78   Mild uncontrolled here, pt to continue medical treatment lotrel, hydralazine, catapres as declines change in tx today   Dyslipidemia, goal LDL below 130 Lab Results  Component Value Date   LDLCALC 96 06/21/2020   Stable, pt to continue current statin lovastatin   Tinnitus Uncontrolled, D/w pt, unfortunately no specific etiology or tx available today  Vertigo Mild uncontrolled, suspected peripheral vertigo such as BPPV, for meclizine prn  Hemorrhoid Ok for anusol supp asd,  to f/u any worsening symptoms or concerns  Followup: No follow-ups on file.  Cathlean Cower, MD 12/01/2020 8:42 PM [facility]

## 2020-12-02 ENCOUNTER — Encounter: Payer: Self-pay | Admitting: Internal Medicine

## 2020-12-02 DIAGNOSIS — J449 Chronic obstructive pulmonary disease, unspecified: Secondary | ICD-10-CM

## 2020-12-02 DIAGNOSIS — K2970 Gastritis, unspecified, without bleeding: Secondary | ICD-10-CM

## 2020-12-02 DIAGNOSIS — I5032 Chronic diastolic (congestive) heart failure: Secondary | ICD-10-CM

## 2020-12-02 LAB — URINALYSIS, ROUTINE W REFLEX MICROSCOPIC
Bilirubin Urine: NEGATIVE
Hgb urine dipstick: NEGATIVE
Ketones, ur: NEGATIVE
Leukocytes,Ua: NEGATIVE
Nitrite: NEGATIVE
RBC / HPF: NONE SEEN (ref 0–?)
Specific Gravity, Urine: <=1.005 — AB (ref 1.000–1.030)
Total Protein, Urine: NEGATIVE
Urine Glucose: NEGATIVE
Urobilinogen, UA: 0.2 (ref 0.0–1.0)
pH: 6 (ref 5.0–8.0)

## 2020-12-07 ENCOUNTER — Telehealth: Payer: Self-pay | Admitting: Pulmonary Disease

## 2020-12-07 NOTE — Telephone Encounter (Signed)
Continue 2L continuously day and night. Maintain O2 over 88%. If needing more than 2L please let us know.  Recommend CXR if patient can be transported. Given lack of cough, wheeze, fever, low suspicion for COPD exacerbation. Possible fluid overload.

## 2020-12-07 NOTE — Telephone Encounter (Signed)
Pt daughter,Pamela(not on DPR,but is with the pt) calling concerned about pt's oxygen level. Pt is supposed to just wear the oxygen while she's out and about or doing activity. Pt called Olin Hauser and stated she wasn't feeling well, and her breathing felt "off". Olin Hauser checked oxygen sat this am,with oxygen on and it was 85%,sat a few minutes, climbed to 88%. Pt ate breakfast with oxygen off, dropped to 78%, had pt put oxygen on, slowly climbed up to 90%. Please advise [phone]

## 2020-12-07 NOTE — Telephone Encounter (Signed)
Called and spoke with patient's daughter. She verbalized. She asked the patient if she wanted to come in for CXR, patient replied that she wanted to hold off on the CXR for now. I advised Wendy Torres that if she wants the CXR to call our office and we can place the order. She verbalized understanding.   Nothing further needed at time of call.

## 2020-12-07 NOTE — Telephone Encounter (Signed)
Called and spoke with patient's daughter Wendy Torres. I explained to her that since she was not on the DPR, I would need to get a verbal from the patient that it was ok to speak with Wendy Torres. Patient provided with the verbal.   I spoke with Wendy Torres. She stated that the patient has been mostly wearing her O2 with exertion or when she leaves the house, never 24/7. For the past few days, she has been complaining of not feeling like herself, increased fatigue. Wendy Torres checked her O2 levels this morning when she first woke up and noticed that it was at 85% on room air.  She placed her on 2L of O2 and her O2 slowly recovered to 88%. The patient then walked to the den to eat her breakfast, she removed the O2 and her O2 dropped to 78%. She had her to put the O2 back on during breakfast and she has been wearing it ever since.   She denied any wheezing, fevers or coughing. Also denied being around who has been sick recently. She last used her albuterol inhaler yesterday afternoon.   I advised her to keep her on 2L of O2 until she hears back from our office. She verbalized understanding.   MH, can you please advise? Thanks!

## 2020-12-09 ENCOUNTER — Telehealth: Payer: Self-pay | Admitting: Pulmonary Disease

## 2020-12-09 ENCOUNTER — Ambulatory Visit (INDEPENDENT_AMBULATORY_CARE_PROVIDER_SITE_OTHER): Payer: Medicare Other

## 2020-12-09 ENCOUNTER — Ambulatory Visit: Payer: Medicare Other

## 2020-12-09 DIAGNOSIS — S2231XA Fracture of one rib, right side, initial encounter for closed fracture: Secondary | ICD-10-CM | POA: Diagnosis not present

## 2020-12-09 DIAGNOSIS — J9611 Chronic respiratory failure with hypoxia: Secondary | ICD-10-CM

## 2020-12-09 DIAGNOSIS — J9690 Respiratory failure, unspecified, unspecified whether with hypoxia or hypercapnia: Secondary | ICD-10-CM | POA: Diagnosis not present

## 2020-12-09 DIAGNOSIS — R0602 Shortness of breath: Secondary | ICD-10-CM | POA: Diagnosis not present

## 2020-12-09 DIAGNOSIS — I517 Cardiomegaly: Secondary | ICD-10-CM | POA: Diagnosis not present

## 2020-12-09 DIAGNOSIS — R0902 Hypoxemia: Secondary | ICD-10-CM | POA: Diagnosis not present

## 2020-12-09 NOTE — Telephone Encounter (Signed)
I called and spoke with pts daughter and she is aware of cxr order that has been placed.   She will bring her in tomorrow for the cxr.  Nothing further is needed.

## 2020-12-09 NOTE — Telephone Encounter (Signed)
Pts daughter stated that they would like her mother to go through with the CXR. Pls regard; [phone].

## 2020-12-18 DIAGNOSIS — I5032 Chronic diastolic (congestive) heart failure: Secondary | ICD-10-CM

## 2020-12-19 NOTE — Telephone Encounter (Signed)
PM please advise. Thanks   Hello, Could you explain the findings in our mom's recent chest X-ray results? Are there any items we should be concerned about?

## 2020-12-20 NOTE — Telephone Encounter (Signed)
Chest x-ray shows some chronic changes of COPD.  There is no acute process such as pneumonia There is a notation that the heart may be slightly enlarged consistent with history of heart failure  Please order echocardiogram of the heart for further evaluation

## 2020-12-22 DIAGNOSIS — H16222 Keratoconjunctivitis sicca, not specified as Sjogren's, left eye: Secondary | ICD-10-CM | POA: Diagnosis not present

## 2020-12-30 DIAGNOSIS — M6281 Muscle weakness (generalized): Secondary | ICD-10-CM | POA: Diagnosis not present

## 2020-12-30 DIAGNOSIS — Z471 Aftercare following joint replacement surgery: Secondary | ICD-10-CM | POA: Diagnosis not present

## 2020-12-30 DIAGNOSIS — Z96642 Presence of left artificial hip joint: Secondary | ICD-10-CM | POA: Diagnosis not present

## 2020-12-30 DIAGNOSIS — R269 Unspecified abnormalities of gait and mobility: Secondary | ICD-10-CM | POA: Diagnosis not present

## 2021-01-14 ENCOUNTER — Other Ambulatory Visit: Payer: Self-pay | Admitting: Internal Medicine

## 2021-01-21 ENCOUNTER — Other Ambulatory Visit: Payer: Self-pay | Admitting: Internal Medicine

## 2021-01-21 DIAGNOSIS — M8949 Other hypertrophic osteoarthropathy, multiple sites: Secondary | ICD-10-CM

## 2021-01-21 DIAGNOSIS — M159 Polyosteoarthritis, unspecified: Secondary | ICD-10-CM

## 2021-01-24 ENCOUNTER — Ambulatory Visit (HOSPITAL_COMMUNITY): Payer: Medicare Other | Attending: Cardiology

## 2021-01-24 ENCOUNTER — Other Ambulatory Visit: Payer: Self-pay

## 2021-01-24 DIAGNOSIS — I5032 Chronic diastolic (congestive) heart failure: Secondary | ICD-10-CM | POA: Insufficient documentation

## 2021-01-24 LAB — ECHOCARDIOGRAM COMPLETE
Area-P 1/2: 3.29 cm2
S' Lateral: 2.7 cm

## 2021-01-26 DIAGNOSIS — R0683 Snoring: Secondary | ICD-10-CM

## 2021-01-27 ENCOUNTER — Other Ambulatory Visit: Payer: Self-pay | Admitting: Allergy and Immunology

## 2021-01-27 NOTE — Telephone Encounter (Signed)
Hello Dr. Vaughan Browner, please advise on mychart message, I did inform them of the 4 day rule for reading results. Thanks!  Hi, Have you reviewed the Echo scanning my mom Wendy Torres  completed on 9/26?

## 2021-01-28 NOTE — Telephone Encounter (Signed)
Order has been placed for home sleep test.

## 2021-01-28 NOTE — Telephone Encounter (Signed)
I called and discussed results with patient's daughter Echo shows normal LVEF, mild LVH and pulmonary HTN  Please order home sleep study to eval for OSA and nocturnal hypoxia

## 2021-01-29 DIAGNOSIS — Z96642 Presence of left artificial hip joint: Secondary | ICD-10-CM | POA: Diagnosis not present

## 2021-01-29 DIAGNOSIS — Z471 Aftercare following joint replacement surgery: Secondary | ICD-10-CM | POA: Diagnosis not present

## 2021-01-29 DIAGNOSIS — R269 Unspecified abnormalities of gait and mobility: Secondary | ICD-10-CM | POA: Diagnosis not present

## 2021-01-29 DIAGNOSIS — M6281 Muscle weakness (generalized): Secondary | ICD-10-CM | POA: Diagnosis not present

## 2021-02-01 DIAGNOSIS — N2581 Secondary hyperparathyroidism of renal origin: Secondary | ICD-10-CM | POA: Diagnosis not present

## 2021-02-01 DIAGNOSIS — N1832 Chronic kidney disease, stage 3b: Secondary | ICD-10-CM | POA: Diagnosis not present

## 2021-02-01 DIAGNOSIS — I129 Hypertensive chronic kidney disease with stage 1 through stage 4 chronic kidney disease, or unspecified chronic kidney disease: Secondary | ICD-10-CM | POA: Diagnosis not present

## 2021-02-01 DIAGNOSIS — D631 Anemia in chronic kidney disease: Secondary | ICD-10-CM | POA: Diagnosis not present

## 2021-02-10 NOTE — Telephone Encounter (Signed)
We are about 8 weeks out now on scheduling the home sleep studies.  Will route this back to triage so they can let patient now thru [facility].

## 2021-02-10 NOTE — Telephone Encounter (Signed)
Home sleep study was ordered on 9/30. It will take 2-4 weeks to set up.  Please follow with [facility] to see where are are with this. Thanks

## 2021-02-10 NOTE — Telephone Encounter (Signed)
PCCs could you please advise on the scheduling of sleep study? Thank you

## 2021-02-10 NOTE — Telephone Encounter (Signed)
Dr. Vaughan Browner please advise on following My Chart message. Thank you   Hey, This is Wendy Torres, Wendy Torres daughter. I spoke with you on 9/30 regarding the results of my mom echocardiogram. You mentioned you wanted her to have a sleep study but we haven't heard anything else about it. Please let us know the next steps regarding completing the sleep study.

## 2021-02-26 ENCOUNTER — Other Ambulatory Visit: Payer: Self-pay | Admitting: Internal Medicine

## 2021-03-01 DIAGNOSIS — Z96642 Presence of left artificial hip joint: Secondary | ICD-10-CM | POA: Diagnosis not present

## 2021-03-01 DIAGNOSIS — R269 Unspecified abnormalities of gait and mobility: Secondary | ICD-10-CM | POA: Diagnosis not present

## 2021-03-01 DIAGNOSIS — M6281 Muscle weakness (generalized): Secondary | ICD-10-CM | POA: Diagnosis not present

## 2021-03-01 DIAGNOSIS — Z471 Aftercare following joint replacement surgery: Secondary | ICD-10-CM | POA: Diagnosis not present

## 2021-03-08 ENCOUNTER — Other Ambulatory Visit: Payer: Self-pay | Admitting: Internal Medicine

## 2021-03-14 ENCOUNTER — Other Ambulatory Visit: Payer: Self-pay | Admitting: Internal Medicine

## 2021-03-31 DIAGNOSIS — R269 Unspecified abnormalities of gait and mobility: Secondary | ICD-10-CM | POA: Diagnosis not present

## 2021-03-31 DIAGNOSIS — M6281 Muscle weakness (generalized): Secondary | ICD-10-CM | POA: Diagnosis not present

## 2021-03-31 DIAGNOSIS — Z96642 Presence of left artificial hip joint: Secondary | ICD-10-CM | POA: Diagnosis not present

## 2021-03-31 DIAGNOSIS — Z471 Aftercare following joint replacement surgery: Secondary | ICD-10-CM | POA: Diagnosis not present

## 2021-04-13 DIAGNOSIS — N2581 Secondary hyperparathyroidism of renal origin: Secondary | ICD-10-CM | POA: Diagnosis not present

## 2021-04-13 DIAGNOSIS — N183 Chronic kidney disease, stage 3 unspecified: Secondary | ICD-10-CM | POA: Diagnosis not present

## 2021-04-13 DIAGNOSIS — N1832 Chronic kidney disease, stage 3b: Secondary | ICD-10-CM | POA: Diagnosis not present

## 2021-04-13 DIAGNOSIS — D631 Anemia in chronic kidney disease: Secondary | ICD-10-CM | POA: Diagnosis not present

## 2021-04-13 DIAGNOSIS — I129 Hypertensive chronic kidney disease with stage 1 through stage 4 chronic kidney disease, or unspecified chronic kidney disease: Secondary | ICD-10-CM | POA: Diagnosis not present

## 2021-04-18 ENCOUNTER — Ambulatory Visit: Payer: Medicare Other | Admitting: Internal Medicine

## 2021-04-21 ENCOUNTER — Other Ambulatory Visit: Payer: Self-pay

## 2021-04-21 ENCOUNTER — Telehealth: Payer: Self-pay | Admitting: Pulmonary Disease

## 2021-04-21 ENCOUNTER — Ambulatory Visit: Payer: Medicare Other

## 2021-04-21 DIAGNOSIS — R0683 Snoring: Secondary | ICD-10-CM

## 2021-04-21 NOTE — Telephone Encounter (Signed)
Order was placed on 01/28/21 for home sleep study.  Pt came today to pick up machine and she is on 3 liters of O2 at night.  Pt can not do hst on O2.  Will need inlab study if study is needed.  Will route to triage.

## 2021-04-21 NOTE — Telephone Encounter (Signed)
Per Judeen Hammans this patient is not able to complete a home sleep study since she is on Oxygen. Please advise if ok to order in lab sleep study?

## 2021-04-26 NOTE — Telephone Encounter (Signed)
Okay to order in lab sleep study

## 2021-04-26 NOTE — Telephone Encounter (Signed)
Order placed for in lab sleep study on 3 liter O2.

## 2021-04-28 ENCOUNTER — Ambulatory Visit (HOSPITAL_BASED_OUTPATIENT_CLINIC_OR_DEPARTMENT_OTHER): Payer: Medicare Other | Attending: Pulmonary Disease | Admitting: Internal Medicine

## 2021-04-28 ENCOUNTER — Ambulatory Visit (INDEPENDENT_AMBULATORY_CARE_PROVIDER_SITE_OTHER): Payer: Medicare Other | Admitting: Internal Medicine

## 2021-04-28 ENCOUNTER — Other Ambulatory Visit: Payer: Self-pay

## 2021-04-28 VITALS — BP 156/70 | HR 83 | Ht 64.0 in | Wt 195.0 lb

## 2021-04-28 DIAGNOSIS — K5900 Constipation, unspecified: Secondary | ICD-10-CM | POA: Diagnosis not present

## 2021-04-28 DIAGNOSIS — I10 Essential (primary) hypertension: Secondary | ICD-10-CM | POA: Diagnosis not present

## 2021-04-28 DIAGNOSIS — R0683 Snoring: Secondary | ICD-10-CM | POA: Insufficient documentation

## 2021-04-28 DIAGNOSIS — Z79899 Other long term (current) drug therapy: Secondary | ICD-10-CM | POA: Diagnosis not present

## 2021-04-28 DIAGNOSIS — K2970 Gastritis, unspecified, without bleeding: Secondary | ICD-10-CM | POA: Diagnosis not present

## 2021-04-28 DIAGNOSIS — J449 Chronic obstructive pulmonary disease, unspecified: Secondary | ICD-10-CM

## 2021-04-28 NOTE — Patient Instructions (Signed)
Please continue all other medications as before, and refills have been done if requested. ° °Please have the pharmacy call with any other refills you may need. ° °Please continue your efforts at being more active, low cholesterol diet, and weight control. ° °You are otherwise up to date with prevention measures today. ° °Please keep your appointments with your specialists as you may have planned ° °Please make an Appointment to return in 6 months, or sooner if needed °

## 2021-04-28 NOTE — Progress Notes (Signed)
Patient ID: Wendy Torres, female   DOB: 11-27-1940, 80 y.o.   MRN: [id number]        Chief Complaint: follow up HTN, gastritis, constipation, copd       HPI:  Wendy Torres is a 80 y.o. female here overall doing ok here with family; Pt denies chest pain, increased sob or doe, wheezing, orthopnea, PND, increased LE swelling, palpitations, dizziness or syncope.   Pt denies polydipsia, polyuria, or new focal neuro s/s.  Denies worsening reflux, abd pain, dysphagia, n/v, or blood, but has worsening constipation not well managed with miralax daily.  Denies urinary symptoms such as dysuria, frequency, urgency, flank pain, hematuria or n/v, fever, chills.           Wt Readings from Last 3 Encounters:  04/28/21 195 lb (88.5 kg)  04/28/21 195 lb (88.5 kg)  12/01/20 195 lb (88.5 kg)   BP Readings from Last 3 Encounters:  04/28/21 (!) 156/70  12/01/20 (!) 142/78  10/15/20 (!) 152/78         Past Medical History:  Diagnosis Date   ABNORMAL CHEST XRAY 11/05/2008   ACUTE URIS OF UNSPECIFIED SITE 10/22/2007   ALLERGIC RHINITIS 12/09/2006   ANEMIA 01/14/2009   Anxiety    Arthritis    ASTHMA 12/09/2006   Asthma    Blood transfusion without reported diagnosis    Cataract    bilateral -removed   CHF (congestive heart failure) ([place])    Chronic pain syndrome 02/16/2009   COPD 12/09/2006   DEGENERATIVE JOINT DISEASE 12/09/2006   DIZZINESS 03/30/2010   DYSPNEA 01/27/2010   with heavy exertion   FREQUENCY, URINARY 03/30/2010   GERD 12/09/2006   Glossitis 7/90/2409   HELICOBACTER PYLORI GASTRITIS, HX OF 12/09/2006   HIATAL HERNIA 03/01/2010   History of hiatal hernia    HYPERLIPIDEMIA 04/18/2007   HYPERSOMNIA 08/31/2008   HYPERTENSION 12/09/2006   INSOMNIA-SLEEP DISORDER-UNSPEC 11/05/2008   LOW BACK PAIN 12/09/2006   Morbid obesity ([place]) 12/09/2006   Nonspecific (abnormal) findings on radiological and other examination of body structure 11/05/2008   OSTEOPOROSIS 12/09/2006   OVERACTIVE BLADDER 04/14/2008    PALPITATIONS, RECURRENT 01/27/2010   Stroke ([place])    pt. stated light one years ago   SYNCOPE 11/05/2008   TRANSIENT ISCHEMIC ATTACK, HX OF 12/09/2006   Past Surgical History:  Procedure Laterality Date   COLONOSCOPY     LUMBAR DISC SURGERY     TONSILLECTOMY     as a child   TOTAL HIP ARTHROPLASTY Right 11/06/2012   Procedure: RIGHT TOTAL HIP ARTHROPLASTY WITH ACETABULAR AUTOGRAFT;  Surgeon: Gearlean Alf, MD;  Location[facility];  Service: Orthopedics;  Laterality: Right;   TOTAL HIP ARTHROPLASTY Left 02/18/2014   Procedure: LEFT TOTAL HIP ARTHROPLASTY;  Surgeon: Gearlean Alf, MD;  Location: [facility];  Service: Orthopedics;  Laterality: Left;   TUBAL LIGATION      reports that she quit smoking about 48 years ago. Her smoking use included cigarettes. She has a 12.50 pack-year smoking history. She has never used smokeless tobacco. She reports that she does not drink alcohol and does not use drugs. family history includes Heart disease in her mother; Hypertension in her brother; Lung cancer in her father. Allergies  Allergen Reactions   Chlorthalidone    Current Outpatient Medications on File Prior to Visit  Medication Sig Dispense Refill   acetaminophen (TYLENOL) 325 MG tablet Take 2 tablets (650 mg total) by mouth every 6 (six) hours as needed for  mild pain (or Fever >/= 101). 40 tablet 0   ALPRAZolam (XANAX) 0.25 MG tablet TAKE 1 TABLET BY MOUTH TWICE A DAY AS NEEDED 60 tablet 2   amLODipine-benazepril (LOTREL) 10-40 MG capsule Take 1 capsule by mouth daily. 90 capsule 3   cloNIDine (CATAPRES) 0.1 MG tablet Take 1 tablet (0.1 mg total) by mouth 2 (two) times daily. 180 tablet 3   furosemide (LASIX) 20 MG tablet TAKE 1 TABLET BY MOUTH IN  THE MORNING 90 tablet 3   gabapentin (NEURONTIN) 300 MG capsule TAKE 1 CAPSULE BY MOUTH 3  TIMES DAILY 270 capsule 1   hydrALAZINE (APRESOLINE) 100 MG tablet Take 100 mg by mouth 3 (three) times daily.     hydrocortisone (ANUSOL-HC) 25 MG suppository  Place 1 suppository (25 mg total) rectally every 12 (twelve) hours. 12 suppository 1   lovastatin (MEVACOR) 40 MG tablet TAKE 1 TABLET BY MOUTH AT  BEDTIME 90 tablet 3   meclizine (ANTIVERT) 12.5 MG tablet TAKE 1 TABLET BY MOUTH 3 TIMES DAILY AS NEEDED FOR DIZZINESS. 30 tablet 1   nystatin (MYCOSTATIN) 100000 UNIT/ML suspension SWISH AND SPIT 5MLS 2 TIMES DAILY AFTER USING SYMBICORT. 300 mL 5   oxybutynin (DITROPAN) 5 MG tablet TAKE 1 TABLET BY MOUTH 3  TIMES DAILY 270 tablet 3   pantoprazole (PROTONIX) 40 MG tablet TAKE 1 TABLET BY MOUTH  DAILY 90 tablet 3   PROAIR HFA 108 (90 Base) MCG/ACT inhaler TAKE 2 PUFFS BY MOUTH EVERY 6 HOURS AS NEEDED FOR WHEEZE OR SHORTNESS OF BREATH 8.5 each 2   sucralfate (CARAFATE) 1 g tablet Take 1 tablet (1 g total) by mouth 4 (four) times daily -  with meals and at bedtime. 120 tablet 0   SYMBICORT 160-4.5 MCG/ACT inhaler TAKE 2 PUFFS BY MOUTH TWICE A DAY 10.2 each 5   tiZANidine (ZANAFLEX) 4 MG tablet TAKE 1 TABLET BY MOUTH EVERY 6 HOURS AS NEEDED FOR MUSCLE SPASMS 50 tablet 1   traMADol (ULTRAM) 50 MG tablet TAKE 1-2 TABLETS (50-100 MG TOTAL) BY MOUTH EVERY 6 (SIX) HOURS AS NEEDED. FOR PAIN 120 tablet 2   traZODone (DESYREL) 50 MG tablet TAKE 1/2 TO 1 TABLET BY MOUTH AT BEDTIME AS NEEDED FOR SLEEP 90 tablet 1   RESTASIS 0.05 % ophthalmic emulsion      No current facility-administered medications on file prior to visit.        ROS:  All others reviewed and negative.  Objective        PE:  BP (!) 156/70    Pulse 83    Ht 5\' 4"  (1.626 m)    Wt 195 lb (88.5 kg)    SpO2 92%    BMI 33.47 kg/m                 Constitutional: Pt appears in NAD               HENT: Head: NCAT.                Right Ear: External ear normal.                 Left Ear: External ear normal.                Eyes: . Pupils are equal, round, and reactive to light. Conjunctivae and EOM are normal               Nose: without d/c or deformity  Neck: Neck supple. Gross normal  ROM               Cardiovascular: Normal rate and regular rhythm.                 Pulmonary/Chest: Effort normal and breath sounds without rales or wheezing.                Abd:  Soft, NT, ND, + BS, no organomegaly               Neurological: Pt is alert. At baseline orientation, motor grossly intact               Skin: Skin is warm. No rashes, no other new lesions, LE edema - chronic trace bilateral               Psychiatric: Pt behavior is normal without agitation   Micro: none  Cardiac tracings I have personally interpreted today:  none  Pertinent Radiological findings (summarize): none   Lab Results  Component Value Date   WBC 6.2 12/01/2020   HGB 11.9 (L) 12/01/2020   HCT 37.0 12/01/2020   PLT 319.0 12/01/2020   GLUCOSE 92 12/01/2020   CHOL 164 12/01/2020   TRIG 70.0 12/01/2020   HDL 71.30 12/01/2020   LDLCALC 78 12/01/2020   ALT 11 12/01/2020   AST 16 12/01/2020   NA 135 12/01/2020   K 4.1 12/01/2020   CL 98 12/01/2020   CREATININE 0.96 12/01/2020   BUN 11 12/01/2020   CO2 28 12/01/2020   TSH 1.38 12/01/2020   INR 1.02 02/10/2014   HGBA1C 5.6 12/01/2020   Assessment/Plan:  Wendy Torres is a 80 y.o. Black or African American [2] female with  has a past medical history of ABNORMAL CHEST XRAY (11/05/2008), ACUTE URIS OF UNSPECIFIED SITE (10/22/2007), ALLERGIC RHINITIS (12/09/2006), ANEMIA (01/14/2009), Anxiety, Arthritis, ASTHMA (12/09/2006), Asthma, Blood transfusion without reported diagnosis, Cataract, CHF (congestive heart failure) ([place]), Chronic pain syndrome (02/16/2009), COPD (12/09/2006), DEGENERATIVE JOINT DISEASE (12/09/2006), DIZZINESS (03/30/2010), DYSPNEA (01/27/2010), FREQUENCY, URINARY (03/30/2010), GERD (12/09/2006), Glossitis (7/82/9562), HELICOBACTER PYLORI GASTRITIS, HX OF (12/09/2006), HIATAL HERNIA (03/01/2010), History of hiatal hernia, HYPERLIPIDEMIA (04/18/2007), HYPERSOMNIA (08/31/2008), HYPERTENSION (12/09/2006), INSOMNIA-SLEEP DISORDER-UNSPEC (11/05/2008), LOW  BACK PAIN (12/09/2006), Morbid obesity ([place]) (12/09/2006), Nonspecific (abnormal) findings on radiological and other examination of body structure (11/05/2008), OSTEOPOROSIS (12/09/2006), OVERACTIVE BLADDER (04/14/2008), PALPITATIONS, RECURRENT (01/27/2010), Stroke ([place]), SYNCOPE (11/05/2008), and TRANSIENT ISCHEMIC ATTACK, HX OF (12/09/2006).  COPD with chronic bronchitis and emphysema (HCC) Stable overall, continue inhaler prn,  to f/u any worsening symptoms or concerns   Essential hypertension BP Readings from Last 3 Encounters:  04/28/21 (!) 156/70  12/01/20 (!) 142/78  10/15/20 (!) 152/78   Chronic mild uncontrolled, pt state better controlled at home,, pt to continue medical treatment lotrel, clonidine, hydralazine as declines cahnge   Gastritis Resolved,  to f/u any worsening symptoms or concerns  Constipation Ok for dulcolox prn addition,  to f/u any worsening symptoms or concerns  Followup: Return in about 6 months (around 10/27/2021).  Cathlean Cower, MD 04/29/2021 7:00 PM [facility]

## 2021-04-29 ENCOUNTER — Encounter: Payer: Self-pay | Admitting: Internal Medicine

## 2021-04-29 NOTE — Assessment & Plan Note (Signed)
Resolved,  to f/u any worsening symptoms or concerns  

## 2021-04-29 NOTE — Assessment & Plan Note (Signed)
BP Readings from Last 3 Encounters:  04/28/21 (!) 156/70  12/01/20 (!) 142/78  10/15/20 (!) 152/78   Chronic mild uncontrolled, pt state better controlled at home,, pt to continue medical treatment lotrel, clonidine, hydralazine as declines cahnge

## 2021-04-29 NOTE — Assessment & Plan Note (Signed)
Ok for dulcolox prn addition,  to f/u any worsening symptoms or concerns

## 2021-04-29 NOTE — Assessment & Plan Note (Signed)
Stable overall, continue inhaler prn,  to f/u any worsening symptoms or concerns

## 2021-05-01 DIAGNOSIS — R269 Unspecified abnormalities of gait and mobility: Secondary | ICD-10-CM | POA: Diagnosis not present

## 2021-05-01 DIAGNOSIS — Z471 Aftercare following joint replacement surgery: Secondary | ICD-10-CM | POA: Diagnosis not present

## 2021-05-01 DIAGNOSIS — R0683 Snoring: Secondary | ICD-10-CM | POA: Diagnosis not present

## 2021-05-01 DIAGNOSIS — M6281 Muscle weakness (generalized): Secondary | ICD-10-CM | POA: Diagnosis not present

## 2021-05-01 DIAGNOSIS — Z96642 Presence of left artificial hip joint: Secondary | ICD-10-CM | POA: Diagnosis not present

## 2021-05-01 NOTE — Procedures (Signed)
° ° °  Patient Name: Wendy Torres, Gunkel Date: 04/28/2021 Gender: Female D.O.B: [birth year] Age (years): 49 Referring Provider: Marshell Garfinkel Height (inches): 64 Interpreting Physician: Baird Lyons MD, ABSM Weight (lbs): 195 RPSGT: Zadie Rhine BMI: 33 MRN: [id number] Neck Size: 15.00  CLINICAL INFORMATION Sleep Study Type: NPSG Indication for sleep study: Non-refreshing Sleep, Snoring Epworth Sleepiness Score: 1  SLEEP STUDY TECHNIQUE As per the AASM Manual for the Scoring of Sleep and Associated Events v2.3 (April 2016) with a hypopnea requiring 4% desaturations.  The channels recorded and monitored were frontal, central and occipital EEG, electrooculogram (EOG), submentalis EMG (chin), nasal and oral airflow, thoracic and abdominal wall motion, anterior tibialis EMG, snore microphone, electrocardiogram, and pulse oximetry.  MEDICATIONS Medications self-administered by patient taken the night of the study : LOVASTATIN, TRAZODONE, chrolesterol  SLEEP ARCHITECTURE The study was initiated at 10:48:16 PM and ended at 4:57:14 AM.  Sleep onset time was 10.8 minutes and the sleep efficiency was 78.0%%. The total sleep time was 287.7 minutes.  Stage REM latency was 68.5 minutes.  The patient spent 4.5%% of the night in stage N1 sleep, 34.5%% in stage N2 sleep, 53.2%% in stage N3 and 7.8% in REM.  Alpha intrusion was absent.  Supine sleep was 67.71%.  RESPIRATORY PARAMETERS The overall apnea/hypopnea index (AHI) was 0.0 per hour. There were 0 total apneas, including 0 obstructive, 0 central and 0 mixed apneas. There were 0 hypopneas and 0 RERAs.  The AHI during Stage REM sleep was 0.0 per hour.  AHI while supine was 0.0 per hour.  The mean oxygen saturation was 94.7%. The minimum SpO2 during sleep was 90.0%.  soft snoring was noted during this study.  CARDIAC DATA The 2 lead EKG demonstrated sinus rhythm and very frequent PACs with occasional PVC. Some intervals of Atrial  Fibrillation or Junctional rhythm not excluded on limited EKG.. The mean heart rate was 63.2 beats per minute. Other EKG findings include: PVCs.  LEG MOVEMENT DATA The total PLMS were 0 with a resulting PLMS index of 0.0. Associated arousal with leg movement index was 0.0 .  IMPRESSIONS - No significant obstructive sleep apnea occurred during this study (AHI = 0.0/h). - The patient had minimal or no oxygen desaturation during the study (Min O2 = 90.0%) - The patient snored with soft snoring volume. - EKG findings include PACs and PVCs. Suggest 12 lead EKG. - Clinically significant periodic limb movements did not occur during sleep. No significant associated arousals.  DIAGNOSIS - Primary Snoring  RECOMMENDATIONS - Sleep hygiene should be reviewed to assess factors that may improve sleep quality. - Weight management and regular exercise should be initiated or continued if appropriate.  [Electronically signed] 05/01/2021 01:09 PM  Baird Lyons MD, [place], American Board of Sleep Medicine   NPI: [id number]                         [place], [place] [facility]  ELECTRONICALLY SIGNED ON:  05/01/2021, 12:59 PM [place] PH: [phone]   FX: [phone] [place]

## 2021-05-07 ENCOUNTER — Other Ambulatory Visit: Payer: Self-pay | Admitting: Internal Medicine

## 2021-05-11 ENCOUNTER — Other Ambulatory Visit: Payer: Self-pay | Admitting: Internal Medicine

## 2021-05-11 DIAGNOSIS — M159 Polyosteoarthritis, unspecified: Secondary | ICD-10-CM

## 2021-05-14 ENCOUNTER — Encounter: Payer: Self-pay | Admitting: Pulmonary Disease

## 2021-05-16 NOTE — Telephone Encounter (Signed)
Received a message from patient asking about the results from her sleep study that was completed on 12/29.   Dr. Vaughan Browner, can you please advise? Thanks!

## 2021-05-17 NOTE — Telephone Encounter (Signed)
Apologize for the delay Please let her know that sleep study does not show significant sleep apnea or drop in oxygen saturation at night.  No treatment needed at present.

## 2021-05-25 DIAGNOSIS — N183 Chronic kidney disease, stage 3 unspecified: Secondary | ICD-10-CM | POA: Diagnosis not present

## 2021-05-30 ENCOUNTER — Other Ambulatory Visit: Payer: Self-pay | Admitting: Internal Medicine

## 2021-05-31 NOTE — Telephone Encounter (Signed)
Please refill as per office routine med refill policy (all routine meds to be refilled for 3 mo or monthly (per pt preference) up to one year from last visit, then month to month grace period for 3 mo, then further med refills will have to be denied) ? ?

## 2021-06-01 DIAGNOSIS — M6281 Muscle weakness (generalized): Secondary | ICD-10-CM | POA: Diagnosis not present

## 2021-06-01 DIAGNOSIS — Z471 Aftercare following joint replacement surgery: Secondary | ICD-10-CM | POA: Diagnosis not present

## 2021-06-01 DIAGNOSIS — R269 Unspecified abnormalities of gait and mobility: Secondary | ICD-10-CM | POA: Diagnosis not present

## 2021-06-01 DIAGNOSIS — Z96642 Presence of left artificial hip joint: Secondary | ICD-10-CM | POA: Diagnosis not present

## 2021-06-08 ENCOUNTER — Ambulatory Visit: Payer: Medicare Other | Admitting: Internal Medicine

## 2021-06-12 ENCOUNTER — Ambulatory Visit (HOSPITAL_COMMUNITY)
Admission: EM | Admit: 2021-06-12 | Discharge: 2021-06-12 | Disposition: A | Discharge status: Home / Self Care | Payer: Medicare Other

## 2021-06-12 ENCOUNTER — Emergency Department (HOSPITAL_COMMUNITY)
Admission: EM | Admit: 2021-06-12 | Discharge: 2021-06-12 | Disposition: A | Discharge status: Home / Self Care | Payer: Medicare Other | Attending: Emergency Medicine | Admitting: Emergency Medicine

## 2021-06-12 ENCOUNTER — Emergency Department (HOSPITAL_COMMUNITY): Payer: Medicare Other

## 2021-06-12 ENCOUNTER — Encounter (HOSPITAL_COMMUNITY): Payer: Self-pay

## 2021-06-12 ENCOUNTER — Other Ambulatory Visit: Payer: Self-pay

## 2021-06-12 DIAGNOSIS — Z96643 Presence of artificial hip joint, bilateral: Secondary | ICD-10-CM | POA: Diagnosis not present

## 2021-06-12 DIAGNOSIS — J449 Chronic obstructive pulmonary disease, unspecified: Secondary | ICD-10-CM

## 2021-06-12 DIAGNOSIS — I110 Hypertensive heart disease with heart failure: Secondary | ICD-10-CM | POA: Diagnosis not present

## 2021-06-12 DIAGNOSIS — Z87891 Personal history of nicotine dependence: Secondary | ICD-10-CM | POA: Diagnosis not present

## 2021-06-12 DIAGNOSIS — U071 COVID-19: Secondary | ICD-10-CM | POA: Insufficient documentation

## 2021-06-12 DIAGNOSIS — R0902 Hypoxemia: Secondary | ICD-10-CM | POA: Insufficient documentation

## 2021-06-12 DIAGNOSIS — J9611 Chronic respiratory failure with hypoxia: Secondary | ICD-10-CM

## 2021-06-12 DIAGNOSIS — I509 Heart failure, unspecified: Secondary | ICD-10-CM

## 2021-06-12 DIAGNOSIS — J45909 Unspecified asthma, uncomplicated: Secondary | ICD-10-CM | POA: Insufficient documentation

## 2021-06-12 DIAGNOSIS — R0602 Shortness of breath: Secondary | ICD-10-CM | POA: Diagnosis not present

## 2021-06-12 DIAGNOSIS — I517 Cardiomegaly: Secondary | ICD-10-CM | POA: Diagnosis not present

## 2021-06-12 LAB — CBC WITH DIFFERENTIAL/PLATELET
Abs Immature Granulocytes: 0.01 10*3/uL (ref 0.00–0.07)
Basophils Absolute: 0.1 10*3/uL (ref 0.0–0.1)
Basophils Relative: 1 %
Eosinophils Absolute: 0.1 10*3/uL (ref 0.0–0.5)
Eosinophils Relative: 1 %
HCT: 34.7 % — ABNORMAL LOW (ref 36.0–46.0)
Hemoglobin: 11.1 g/dL — ABNORMAL LOW (ref 12.0–15.0)
Immature Granulocytes: 0 %
Lymphocytes Relative: 20 %
Lymphs Abs: 1.2 10*3/uL (ref 0.7–4.0)
MCH: 27.3 pg (ref 26.0–34.0)
MCHC: 32 g/dL (ref 30.0–36.0)
MCV: 85.5 fL (ref 80.0–100.0)
Monocytes Absolute: 1.7 10*3/uL — ABNORMAL HIGH (ref 0.1–1.0)
Monocytes Relative: 28 %
Neutro Abs: 2.9 10*3/uL (ref 1.7–7.7)
Neutrophils Relative %: 50 %
Platelets: 267 10*3/uL (ref 150–400)
RBC: 4.06 MIL/uL (ref 3.87–5.11)
RDW: 13.8 % (ref 11.5–15.5)
WBC: 5.9 10*3/uL (ref 4.0–10.5)
nRBC: 0 % (ref 0.0–0.2)

## 2021-06-12 LAB — COMPREHENSIVE METABOLIC PANEL
ALT: 12 U/L (ref 0–44)
AST: 21 U/L (ref 15–41)
Albumin: 3.9 g/dL (ref 3.5–5.0)
Alkaline Phosphatase: 63 U/L (ref 38–126)
Anion gap: 9 (ref 5–15)
BUN: 11 mg/dL (ref 8–23)
CO2: 27 mmol/L (ref 22–32)
Calcium: 10.2 mg/dL (ref 8.9–10.3)
Chloride: 99 mmol/L (ref 98–111)
Creatinine, Ser: 1.05 mg/dL — ABNORMAL HIGH (ref 0.44–1.00)
GFR, Estimated: 53 mL/min — ABNORMAL LOW (ref >60)
Glucose, Bld: 97 mg/dL (ref 70–99)
Potassium: 4 mmol/L (ref 3.5–5.1)
Sodium: 135 mmol/L (ref 135–145)
Total Bilirubin: 0.3 mg/dL (ref 0.3–1.2)
Total Protein: 8 g/dL (ref 6.5–8.1)

## 2021-06-12 LAB — RESP PANEL BY RT-PCR (FLU A&B, COVID) ARPGX2
Influenza A by PCR: NEGATIVE
Influenza B by PCR: NEGATIVE
SARS Coronavirus 2 by RT PCR: POSITIVE — AB

## 2021-06-12 MED ORDER — ACETAMINOPHEN 325 MG PO TABS
650.0000 mg | ORAL_TABLET | Freq: Once | ORAL | Status: AC
  Administered 2021-06-12: 650 mg via ORAL
  Filled 2021-06-12: qty 2

## 2021-06-12 MED ORDER — MOLNUPIRAVIR EUA 200MG CAPSULE: 4.0000 | ORAL_CAPSULE | Freq: Two times a day (BID) | ORAL | 0 refills | Status: AC

## 2021-06-12 NOTE — ED Triage Notes (Signed)
Pt here POV d/t testing positive for covid at home 06/12/21. Pt endorses cold symptoms began yesterday, fever, chills, SOB. Denies N/V. Uses 3 liters O2 at baseline. COPD history. Pt feeling weak. O2 dropped into 80s when she was coughing at home.

## 2021-06-12 NOTE — ED Notes (Signed)
PT ambulated in room with walker - pt O2 stayed 93% and above while standing on 3L [place] - pt does not report any weakness or dizziness while standing - pt laid back in bed - O2 did drop to 90% with good pleth and pt reported some SOB

## 2021-06-12 NOTE — ED Provider Notes (Signed)
Patient is a pleasant 81 year old African-American female with a history of COPD, hypertension not well controlled, insomnia sleep disorder, morbid obesity, CVA in 2011, congestive heart failure who presents to urgent care today with chief complaint of to positive COVID test this morning.  Patient is here with her daughter today who states that she began to feel unwell last night.  On arrival, patient was receiving 3 L of O2 via nasal cannula which she states she uses at home.  Initial O2 saturation after sitting on the exam table was 80%.  After 2 to 3 minutes, oxygen saturation with continue 3 L of O2 via nasal cannula rose to 91%.  Auscultation of lungs revealed significantly decreased breath sounds in all lung fields.  Patient and daughter were advised that I recommend that she be taken to the emergency room now for respiratory support and possible treatment options for COVID-19.  Daughter agrees to take her now.   Lynden Oxford Scales, PA-C 06/12/21 1807

## 2021-06-12 NOTE — ED Notes (Signed)
Pt reports having cramps in her legs - says this is not abnormal for her and that she usually takes mustard at home for them - pt given packet of mustard at this time

## 2021-06-12 NOTE — ED Notes (Signed)
Patient is being discharged from the Urgent Care and sent to the Emergency Department via POV . Per [place] Plan, patient is in need of higher level of care due to hypoxia. Patient is aware and verbalizes understanding of plan of care.  Vitals:   06/12/21 1803  BP: (!) 196/73  Pulse: 80  Resp: 15  Temp: 99.4 F (37.4 C)  SpO2: (!) 89%

## 2021-06-12 NOTE — ED Provider Notes (Signed)
[facility] Emergency Department Provider Note MRN:  [id number]  Arrival date & time: 06/12/21     Chief Complaint   covid positve   History of Present Illness   Wendy Torres is a 81 y.o. year-old female with a history of COPD not on home oxygen, CHF, asthma, hyperlipidemia, hypertension, and prior TIA presenting to the ED with chief complaint of shortness of breath.  Patient initially presented to urgent care earlier today after feeling unwell last night.  On arrival to the urgent care the patient was noted to be hypoxic while sitting at exam table at 80% on 3 L of oxygen.  Patient was then placed on 3 L of nasal cannula O2 and transferred to our emergency department.  Patient reports that she has had cough, congestion, chills without fevers and associated mild shortness of breath.  For this reason the patient took an at home COVID test which was positive.  Patient then presented to an urgent care.  Urgent care instructed the patient to present to the emergency department for further evaluation.  Patient denies having chest pain, abdominal pain, vomiting, diarrhea, or new rashes.  Patient has been compliant with her prescribed medication regimen.  Review of Systems  A thorough review of systems was obtained and all systems are negative except as noted in the HPI and PMH.   Patient's Health History    Past Medical History:  Diagnosis Date   ABNORMAL CHEST XRAY 11/05/2008   ACUTE URIS OF UNSPECIFIED SITE 10/22/2007   ALLERGIC RHINITIS 12/09/2006   ANEMIA 01/14/2009   Anxiety    Arthritis    ASTHMA 12/09/2006   Asthma    Blood transfusion without reported diagnosis    Cataract    bilateral -removed   CHF (congestive heart failure) ([place])    Chronic pain syndrome 02/16/2009   COPD 12/09/2006   DEGENERATIVE JOINT DISEASE 12/09/2006   DIZZINESS 03/30/2010   DYSPNEA 01/27/2010   with heavy exertion   FREQUENCY, URINARY 03/30/2010   GERD 12/09/2006   Glossitis  4/85/4627   HELICOBACTER PYLORI GASTRITIS, HX OF 12/09/2006   HIATAL HERNIA 03/01/2010   History of hiatal hernia    HYPERLIPIDEMIA 04/18/2007   HYPERSOMNIA 08/31/2008   HYPERTENSION 12/09/2006   INSOMNIA-SLEEP DISORDER-UNSPEC 11/05/2008   LOW BACK PAIN 12/09/2006   Morbid obesity ([place]) 12/09/2006   Nonspecific (abnormal) findings on radiological and other examination of body structure 11/05/2008   OSTEOPOROSIS 12/09/2006   OVERACTIVE BLADDER 04/14/2008   PALPITATIONS, RECURRENT 01/27/2010   Stroke ([place])    pt. stated light one years ago   SYNCOPE 11/05/2008   TRANSIENT ISCHEMIC ATTACK, HX OF 12/09/2006    Past Surgical History:  Procedure Laterality Date   COLONOSCOPY     LUMBAR DISC SURGERY     TONSILLECTOMY     as a child   TOTAL HIP ARTHROPLASTY Right 11/06/2012   Procedure: RIGHT TOTAL HIP ARTHROPLASTY WITH ACETABULAR AUTOGRAFT;  Surgeon: Gearlean Alf, MD;  Location: [facility];  Service: Orthopedics;  Laterality: Right;   TOTAL HIP ARTHROPLASTY Left 02/18/2014   Procedure: LEFT TOTAL HIP ARTHROPLASTY;  Surgeon: Gearlean Alf, MD;  Location: [facility];  Service: Orthopedics;  Laterality: Left;   TUBAL LIGATION      Family History  Problem Relation Age of Onset   Heart disease Mother        Had pacemaker   Lung cancer Father    Hypertension Brother    Diabetes Neg Hx  Colon cancer Neg Hx    Colon polyps Neg Hx    Kidney disease Neg Hx    Gallbladder disease Neg Hx    Esophageal cancer Neg Hx    Allergic rhinitis Neg Hx    Angioedema Neg Hx    Asthma Neg Hx    Atopy Neg Hx    Eczema Neg Hx    Immunodeficiency Neg Hx    Urticaria Neg Hx     Social History   Socioeconomic History   Marital status: Married    Spouse name: Not on file   Number of children: 10   Years of education: Not on file   Highest education level: Not on file  Occupational History   Occupation: Retired  Tobacco Use   Smoking status: Former    Packs/day: 0.50    Years: 25.00    Pack years: 12.50     Types: Cigarettes    Quit date: 10/29/1972    Years since quitting: 48.6   Smokeless tobacco: Never  Vaping Use   Vaping Use: Never used  Substance and Sexual Activity   Alcohol use: No    Alcohol/week: 0.0 standard drinks   Drug use: No   Sexual activity: Not on file  Other Topics Concern   Not on file  Social History Narrative   Not on file   Social Determinants of Health   Financial Resource Strain: Low Risk    Difficulty of Paying Living Expenses: Not hard at all  Food Insecurity: No Food Insecurity   Worried About Charity fundraiser in the Last Year: Never true   Ran Out of Food in the Last Year: Never true  Transportation Needs: No Transportation Needs   Lack of Transportation (Medical): No   Lack of Transportation (Non-Medical): No  Physical Activity: Inactive   Days of Exercise per Week: 0 days   Minutes of Exercise per Session: 0 min  Stress: No Stress Concern Present   Feeling of Stress : Not at all  Social Connections: Moderately Integrated   Frequency of Communication with Friends and Family: More than three times a week   Frequency of Social Gatherings with Friends and Family: More than three times a week   Attends Religious Services: More than 4 times per year   Active Member of Clubs or Organizations: No   Attends Music therapist: More than 4 times per year   Marital Status: Widowed  Human resources officer Violence: Not At Risk   Fear of Current or Ex-Partner: No   Emotionally Abused: No   Physically Abused: No   Sexually Abused: No     Physical Exam   Physical Exam Constitutional:      Appearance: She is well-developed. She is not ill-appearing.  HENT:     Head: Normocephalic and atraumatic.     Right Ear: External ear normal.     Left Ear: External ear normal.     Nose: Nose normal.  Cardiovascular:     Rate and Rhythm: Normal rate and regular rhythm.     Pulses: Normal pulses.     Heart sounds: Normal heart sounds.  Pulmonary:      Effort: Pulmonary effort is normal. No respiratory distress.     Breath sounds: Normal breath sounds. No stridor. No wheezing or rales.  Abdominal:     General: Abdomen is flat. There is no distension.     Palpations: Abdomen is soft.     Tenderness: There is no abdominal tenderness.  Musculoskeletal:     Cervical back: Neck supple.     Right lower leg: No edema.     Left lower leg: No edema.  Skin:    General: Skin is warm and dry.     Capillary Refill: Capillary refill takes less than 2 seconds.  Neurological:     General: No focal deficit present.     Mental Status: She is alert and oriented to person, place, and time. Mental status is at baseline.      Diagnostic and Interventional Summary    EKG Interpretation  Date/Time:  Sunday June 12 2021 20:03:53 EST Ventricular Rate:  80 PR Interval:  163 QRS Duration: 91 QT Interval:  373 QTC Calculation: 431 R Axis:   23 Text Interpretation: Sinus rhythm Abnormal R-wave progression, early transition No acute changes No significant change since last tracing Confirmed by Varney Biles [phone]) on 06/12/2021 8:50:39 PM       Labs Reviewed  RESP PANEL BY RT-PCR (FLU A&B, COVID) ARPGX2 - Abnormal; Notable for the following components:      Result Value   SARS Coronavirus 2 by RT PCR POSITIVE (*)    All other components within normal limits  CBC WITH DIFFERENTIAL/PLATELET - Abnormal; Notable for the following components:   Hemoglobin 11.1 (*)    HCT 34.7 (*)    Monocytes Absolute 1.7 (*)    All other components within normal limits  COMPREHENSIVE METABOLIC PANEL - Abnormal; Notable for the following components:   Creatinine, Ser 1.05 (*)    GFR, Estimated 53 (*)    All other components within normal limits    DG Chest 2 View  Final Result      Medications  acetaminophen (TYLENOL) tablet 650 mg (650 mg Oral Given 06/12/21 2004)     Procedures  /  Critical Care Procedures  ED Course and Medical Decision Making   Initial Impression and Ddx This is a 81 year old female presents to emergency department for evaluation of shortness of breath and COVID-19.  My differential for this patient includes COVID-19 related shortness of breath, pneumonia, electrolyte abnormality, anemia, pneumothorax.  Will evaluate with chest x-ray, EKG, and basic labs.  Past medical/surgical history that increases complexity of ED encounter: As noted in HPI  Interpretation of Diagnostics I personally reviewed the EKG, Chest Xray, and Cardiac Monitor and my interpretation is as follows: Normal sinus rhythm on EKG without acute ST elevations.  Remains in normal sinus rhythm on cardiac monitor.  Chest x-ray without acute consolidation to suggest pneumonia    Lab work-up without significant abnormality.  Medication list was reviewed with pharmacy and they felt as if Paxlovid was a poor choice in this patient.  We will discharge on molnupiravir  Patient Reassessment and Ultimate Disposition/Management Return precautions were provided to the patient and she was instructed to follow-up with her primary care physician for monitoring of resolution of symptoms.  Patient was discharged home after being able to ambulate on her baseline oxygen saturation  Patient management required discussion with the following services or consulting groups:  None  Complexity of Problems Addressed Acute illness or injury that poses threat of life of bodily function  Additional Data Reviewed and Analyzed Further history obtained from: Prior ED visit notes  Factors Impacting ED Encounter Risk Prescriptions and Consideration of hospitalization    Final Clinical Impressions(s) / ED Diagnoses     ICD-10-CM   1. COVID-19  U07.1       ED Discharge Orders  Ordered    molnupiravir EUA (LAGEVRIO) 200 mg CAPS capsule  2 times daily        06/12/21 2114             Discharge Instructions Discussed with and Provided to Patient:      Discharge Instructions       You are seen and evaluated in our emergency department for your COVID-19 infection with associated shortness of breath.  Chest x-ray was negative for any pneumonias.  Your blood work and electrolyte panel was negative acute abnormalities.  A prescription for Malnupiravir was sent to the [facility] pharmacy on [address] in [place].  Please take this medication as prescribed and follow-up with your primary care physician.  Please feel free to return to our emergency department if you are to experience any worsening shortness of breath or no chest pain.        Zachery Dakins, MD 06/12/21 [place], Ankit, MD 06/13/21 [phone]

## 2021-06-12 NOTE — Discharge Instructions (Signed)
Please go to the emergency room now for further evaluation of your breathing, positive COVID-19 diagnosis and treatment options.

## 2021-06-12 NOTE — ED Triage Notes (Signed)
Presents with c/o SOB, generalized body aches and is positive for COVID.   Pt O2 level is under 89%.

## 2021-06-12 NOTE — Discharge Instructions (Addendum)
°  You are seen and evaluated in our emergency department for your COVID-19 infection with associated shortness of breath.  Chest x-ray was negative for any pneumonias.  Your blood work and electrolyte panel was negative acute abnormalities.  A prescription for Malnupiravir was sent to the [facility] on [address] in [place].  Please take this medication as prescribed and follow-up with your primary care physician.  Please feel free to return to our emergency department if you are to experience any worsening shortness of breath or no chest pain.

## 2021-06-12 NOTE — ED Notes (Signed)
Patient verbalizes understanding of discharge instructions. Opportunity for questioning and answers were provided. Armband removed by staff, pt discharged from ED via wheelchair.  

## 2021-06-13 ENCOUNTER — Telehealth: Payer: Self-pay | Admitting: Internal Medicine

## 2021-06-13 NOTE — Telephone Encounter (Signed)
Connected to [facility] 2.12.2023.   Caller states her mother tested positive for COVID today. She has COPD and is on 3L oxygen. She has a fever of 99.4 orally, headache, chills, and a cough. Her O2 level is 94 currently. States nonproductive cough. Symptoms started yesterday.  Advised to see HCP within 4 hours.

## 2021-06-14 ENCOUNTER — Ambulatory Visit: Payer: Medicare Other | Admitting: Allergy and Immunology

## 2021-06-15 ENCOUNTER — Ambulatory Visit: Payer: Medicare Other | Admitting: Internal Medicine

## 2021-06-15 ENCOUNTER — Other Ambulatory Visit: Payer: Self-pay | Admitting: Internal Medicine

## 2021-06-22 ENCOUNTER — Other Ambulatory Visit: Payer: Self-pay | Admitting: Internal Medicine

## 2021-06-23 ENCOUNTER — Encounter: Payer: Self-pay | Admitting: Internal Medicine

## 2021-06-23 MED ORDER — GABAPENTIN 300 MG PO CAPS: 300.0000 mg | ORAL_CAPSULE | Freq: Three times a day (TID) | ORAL | 1 refills | Status: DC

## 2021-06-23 MED ORDER — FUROSEMIDE 20 MG PO TABS: 20.0000 mg | ORAL_TABLET | Freq: Every morning | ORAL | 3 refills | Status: DC

## 2021-06-29 ENCOUNTER — Encounter: Payer: Self-pay | Admitting: Internal Medicine

## 2021-06-29 DIAGNOSIS — Z471 Aftercare following joint replacement surgery: Secondary | ICD-10-CM | POA: Diagnosis not present

## 2021-06-29 DIAGNOSIS — R269 Unspecified abnormalities of gait and mobility: Secondary | ICD-10-CM | POA: Diagnosis not present

## 2021-06-29 DIAGNOSIS — M6281 Muscle weakness (generalized): Secondary | ICD-10-CM | POA: Diagnosis not present

## 2021-06-29 DIAGNOSIS — Z96642 Presence of left artificial hip joint: Secondary | ICD-10-CM | POA: Diagnosis not present

## 2021-06-29 NOTE — Telephone Encounter (Signed)
Patient scheduled with Dr. Sharlet Salina 07/01/21 at [place] ?

## 2021-07-01 ENCOUNTER — Other Ambulatory Visit: Payer: Self-pay

## 2021-07-01 ENCOUNTER — Ambulatory Visit (INDEPENDENT_AMBULATORY_CARE_PROVIDER_SITE_OTHER): Payer: Medicare Other | Admitting: Internal Medicine

## 2021-07-01 ENCOUNTER — Encounter: Payer: Self-pay | Admitting: Internal Medicine

## 2021-07-01 DIAGNOSIS — J449 Chronic obstructive pulmonary disease, unspecified: Secondary | ICD-10-CM | POA: Diagnosis not present

## 2021-07-01 MED ORDER — PREDNISONE 20 MG PO TABS: 40.0000 mg | ORAL_TABLET | Freq: Every day | ORAL | 0 refills | Status: DC

## 2021-07-01 NOTE — Patient Instructions (Signed)
We have sent in prednisone to take 2 pills daily for 4 days to help clear up the sinuses. ? ? ?

## 2021-07-01 NOTE — Progress Notes (Signed)
? ?  Subjective:  ? ?Patient ID: Wendy Torres, female    DOB: Apr 03, 1941, 81 y.o.   MRN: [id number] ? ?HPI ?The patient is an 81 YO female coming in for residual covid-19 symptoms. ? ?Review of Systems  ?Constitutional:  Negative for activity change, appetite change, chills, fatigue, fever and unexpected weight change.  ?HENT:  Positive for congestion, postnasal drip, rhinorrhea and sinus pressure. Negative for ear discharge, ear pain, sinus pain, sneezing, sore throat, tinnitus, trouble swallowing and voice change.   ?Eyes: Negative.   ?Respiratory:  Negative for cough, chest tightness, shortness of breath and wheezing.   ?Cardiovascular: Negative.   ?Gastrointestinal: Negative.   ?Musculoskeletal:  Positive for arthralgias. Negative for myalgias.  ?Neurological: Negative.   ? ?Objective:  ?Physical Exam ?Constitutional:   ?   Appearance: She is well-developed.  ?HENT:  ?   Head: Normocephalic and atraumatic.  ?   Comments: Oropharynx with redness and clear drainage, nose with swollen turbinates, TMs normal bilaterally.  ?Neck:  ?   Thyroid: No thyromegaly.  ?Cardiovascular:  ?   Rate and Rhythm: Normal rate and regular rhythm.  ?Pulmonary:  ?   Effort: Pulmonary effort is normal. No respiratory distress.  ?   Breath sounds: Normal breath sounds. No wheezing or rales.  ?   Comments: Oxygen in place 3L ?Abdominal:  ?   Palpations: Abdomen is soft.  ?Musculoskeletal:     ?   General: No tenderness.  ?   Cervical back: Normal range of motion.  ?Lymphadenopathy:  ?   Cervical: No cervical adenopathy.  ?Skin: ?   General: Skin is warm and dry.  ?Neurological:  ?   Mental Status: She is alert and oriented to person, place, and time.  ? ? ?Vitals:  ? 07/01/21 0957  ?BP: 126/70  ?Pulse: 88  ?Resp: 18  ?SpO2: 97%  ?Weight: 194 lb 6.4 oz (88.2 kg)  ?Height: 5\' 4"  (1.626 m)  ? ? ?This visit occurred during the SARS-CoV-2 public health emergency.  Safety protocols were in place, including screening questions prior to the visit,  additional usage of staff PPE, and extensive cleaning of exam room while observing appropriate contact time as indicated for disinfecting solutions.  ? ?Assessment & Plan:  ? ?

## 2021-07-01 NOTE — Assessment & Plan Note (Signed)
No clear flare today. Rx prednisone for significant sinus congestion and drainage to prevent flare up. She will continue her 3L O2 as well as albuterol prn and symbicort scheduled BID. ?

## 2021-07-03 ENCOUNTER — Other Ambulatory Visit: Payer: Self-pay | Admitting: Internal Medicine

## 2021-07-04 NOTE — Telephone Encounter (Signed)
Please refill as per office routine med refill policy (all routine meds to be refilled for 3 mo or monthly (per pt preference) up to one year from last visit, then month to month grace period for 3 mo, then further med refills will have to be denied) ? ?

## 2021-07-05 NOTE — Progress Notes (Signed)
? ?FOLLOW UP ?Date of Service/Encounter:  07/06/21 ? ? ?Subjective:  ?Wendy Torres (DOB: 1940/11/03) is a 81 y.o. female who returns to the [facility] on 07/06/2021 in re-evaluation of the following: COPD with asthma, glossitis and sicca syndrome ?History obtained from: chart review and patient and daughter . ? ?For Review, LV was on 06/15/2020 with Dr. Neldon Mc.  She was continued on Symbicort 160, 2 puffs twice a day, nystatin 5 mL swish and spit with Symbicort, Biotene mouth rinse ? ?Today presents for follow-up.  ?She does report having Covid infection a few weeks ago requiring antibiotics but outside of that has been doing well.  ?She did well from a respiratory standpoint.   ?She has not needed antibiotics or steroids outside of that episode. ?Continues on oxygen, not using bubble reservoir.  ?She does continue on biotene and nystatin swish for her dry mouth.  This helps some but she continues to be dry especially at night.  ? ? ?Allergies as of 07/06/2021   ? ?   Reactions  ? Chlorthalidone   ? ?  ? ?  ?Medication List  ?  ? ?  ? Accurate as of July 06, 2021  4:47 PM. If you have any questions, ask your nurse or doctor.  ?  ?  ? ?  ? ?acetaminophen 325 MG tablet ?Commonly known as: TYLENOL ?Take 2 tablets (650 mg total) by mouth every 6 (six) hours as needed for mild pain (or Fever >/= 101). ?  ?ALPRAZolam 0.25 MG tablet ?Commonly known as: Duanne Moron ?TAKE 1 TABLET BY MOUTH TWICE A DAY AS NEEDED ?  ?amLODipine-benazepril 10-40 MG capsule ?Commonly known as: LOTREL ?TAKE 1 CAPSULE BY MOUTH  DAILY ?  ?cloNIDine 0.1 MG tablet ?Commonly known as: CATAPRES ?TAKE 1 TABLET BY MOUTH 2 TIMES DAILY. ?  ?furosemide 20 MG tablet ?Commonly known as: LASIX ?Take 1 tablet (20 mg total) by mouth every morning. ?  ?gabapentin 300 MG capsule ?Commonly known as: NEURONTIN ?Take 1 capsule (300 mg total) by mouth 3 (three) times daily. ?  ?hydrALAZINE 100 MG tablet ?Commonly known as: APRESOLINE ?Take 100 mg by mouth 3  (three) times daily. ?  ?hydrocortisone 25 MG suppository ?Commonly known as: ANUSOL-HC ?Place 1 suppository (25 mg total) rectally every 12 (twelve) hours. ?  ?lovastatin 40 MG tablet ?Commonly known as: MEVACOR ?TAKE 1 TABLET BY MOUTH AT  BEDTIME ?  ?meclizine 12.5 MG tablet ?Commonly known as: ANTIVERT ?TAKE 1 TABLET BY MOUTH 3 TIMES A DAY AS NEEDED FOR DIZZINESS ?  ?MUCINEX ALLERGY PO ?Take by mouth as needed. ?  ?nystatin 100000 UNIT/ML suspension ?Commonly known as: MYCOSTATIN ?SWISH AND SPIT 5MLS 2 TIMES DAILY AFTER USING SYMBICORT. ?  ?oxybutynin 5 MG tablet ?Commonly known as: DITROPAN ?TAKE 1 TABLET BY MOUTH 3  TIMES DAILY ?  ?pantoprazole 40 MG tablet ?Commonly known as: PROTONIX ?TAKE 1 TABLET BY MOUTH  DAILY ?  ?predniSONE 20 MG tablet ?Commonly known as: DELTASONE ?Take 2 tablets (40 mg total) by mouth daily with breakfast. ?  ?spironolactone 25 MG tablet ?Commonly known as: ALDACTONE ?Take 25 mg by mouth daily. ?  ?sucralfate 1 g tablet ?Commonly known as: Carafate ?Take 1 tablet (1 g total) by mouth 4 (four) times daily -  with meals and at bedtime. ?  ?Symbicort 160-4.5 MCG/ACT inhaler ?Generic drug: budesonide-formoterol ?TAKE 2 PUFFS BY MOUTH TWICE A DAY ?  ?tiZANidine 4 MG tablet ?Commonly known as: ZANAFLEX ?TAKE 1 TABLET BY  MOUTH EVERY 6 HOURS AS NEEDED FOR MUSCLE SPASMS. ?  ?traMADol 50 MG tablet ?Commonly known as: ULTRAM ?TAKE 1-2 TABLETS (50-100 MG TOTAL) BY MOUTH EVERY 6 (SIX) HOURS AS NEEDED. FOR PAIN ?  ?traZODone 50 MG tablet ?Commonly known as: DESYREL ?TAKE 1/2 TO 1 TABLET BY MOUTH AT BEDTIME AS NEEDED FOR SLEEP ?  ?Ventolin HFA 108 (90 Base) MCG/ACT inhaler ?Generic drug: albuterol ?Inhale 2 puffs into the lungs every 6 (six) hours as needed for wheezing or shortness of breath. ?What changed: Another medication with the same name was removed. Continue taking this medication, and follow the directions you see here. ?Changed by: Sigurd Sos, MD ?  ? ?  ? ?Past Medical History:   ?Diagnosis Date  ? ABNORMAL CHEST XRAY 11/05/2008  ? ACUTE URIS OF UNSPECIFIED SITE 10/22/2007  ? ALLERGIC RHINITIS 12/09/2006  ? ANEMIA 01/14/2009  ? Anxiety   ? Arthritis   ? ASTHMA 12/09/2006  ? Asthma   ? Blood transfusion without reported diagnosis   ? Cataract   ? bilateral -removed  ? CHF (congestive heart failure) ([place])   ? Chronic pain syndrome 02/16/2009  ? COPD 12/09/2006  ? DEGENERATIVE JOINT DISEASE 12/09/2006  ? DIZZINESS 03/30/2010  ? DYSPNEA 01/27/2010  ? with heavy exertion  ? FREQUENCY, URINARY 03/30/2010  ? GERD 12/09/2006  ? Glossitis 01/14/2009  ? HELICOBACTER PYLORI GASTRITIS, HX OF 12/09/2006  ? HIATAL HERNIA 03/01/2010  ? History of hiatal hernia   ? HYPERLIPIDEMIA 04/18/2007  ? HYPERSOMNIA 08/31/2008  ? HYPERTENSION 12/09/2006  ? INSOMNIA-SLEEP DISORDER-UNSPEC 11/05/2008  ? LOW BACK PAIN 12/09/2006  ? Morbid obesity ([place]) 12/09/2006  ? Nonspecific (abnormal) findings on radiological and other examination of body structure 11/05/2008  ? OSTEOPOROSIS 12/09/2006  ? OVERACTIVE BLADDER 04/14/2008  ? PALPITATIONS, RECURRENT 01/27/2010  ? Stroke [facility])   ? pt. stated light one years ago  ? SYNCOPE 11/05/2008  ? TRANSIENT ISCHEMIC ATTACK, HX OF 12/09/2006  ? ?Past Surgical History:  ?Procedure Laterality Date  ? COLONOSCOPY    ? LUMBAR DISC SURGERY    ? TONSILLECTOMY    ? as a child  ? TOTAL HIP ARTHROPLASTY Right 11/06/2012  ? Procedure: RIGHT TOTAL HIP ARTHROPLASTY WITH ACETABULAR AUTOGRAFT;  Surgeon: Gearlean Alf, MD;  Location: [facility];  Service: Orthopedics;  Laterality: Right;  ? TOTAL HIP ARTHROPLASTY Left 02/18/2014  ? Procedure: LEFT TOTAL HIP ARTHROPLASTY;  Surgeon: Gearlean Alf, MD;  Location: [facility];  Service: Orthopedics;  Laterality: Left;  ? TUBAL LIGATION    ? ?Otherwise, there have been no changes to her past medical history, surgical history, family history, or social history. ? ?ROS: All others negative except as noted per HPI.  ? ?Objective:  ?BP (!) 120/50   Pulse 99   Temp 98 ?F (36.7 ?C) (Temporal)    Resp 16   Ht '5\' 4"'$  (1.626 m)   Wt 195 lb 12.8 oz (88.8 kg)   SpO2 96%   BMI 33.61 kg/m?  ?Body mass index is 33.61 kg/m?Marland Kitchen ?Physical Exam: ?General Appearance:  Alert, cooperative, no distress, appears stated age  ?Head:  Normocephalic, without obvious abnormality, atraumatic  ?Eyes:  Conjunctiva clear, EOM's intact  ?Nose: Nares normal,  nasal cannula in place  ?Throat: Lips, tongue normal; teeth and gums normal,  mucus membranes hydrated, tongue with minimal furrows and normal posterior oropharynx  ?Neck: Supple, symmetrical  ?Lungs:   clear to auscultation bilaterally, Respirations unlabored, no coughing  ?Heart:  regular rate and rhythm  and no murmur, Appears well perfused  ?Extremities: No edema  ?Skin: Skin color, texture, turgor normal, no rashes or lesions on visualized portions of skin  ?Neurologic: No gross deficits  ? ? ? ?Assessment/Plan  ?Doing excellent on current plan.  She continues to have sicca syndrome, but feels her current symptoms are tolerable.  We discussed some ways to try and help with this, but otherwise made no changes to her plan. ? ?1. Continue Symbicort 160 - 2 inhalations 2 times per day with spacer ? ?2. Continue nystatin 5 mls swish and spit after 2 times a day after Symbicort ? ?3. Continue Biotene mouth rinse daily ? ?4. Remain away from antihistamine use ? ?5. Use distilled water or saline in bubble oxygen reservoir ? ?6. Return to clinic in 1 year or earlier if needed ? ?Sigurd Sos, MD  ?[facility] ? ? ? ? ? ? ?

## 2021-07-06 ENCOUNTER — Other Ambulatory Visit: Payer: Self-pay

## 2021-07-06 ENCOUNTER — Ambulatory Visit: Payer: Medicare Other | Admitting: Internal Medicine

## 2021-07-06 ENCOUNTER — Encounter: Payer: Self-pay | Admitting: Internal Medicine

## 2021-07-06 VITALS — BP 120/50 | HR 99 | Temp 98.0°F | Resp 16 | Ht 64.0 in | Wt 195.8 lb

## 2021-07-06 DIAGNOSIS — J449 Chronic obstructive pulmonary disease, unspecified: Secondary | ICD-10-CM | POA: Diagnosis not present

## 2021-07-06 DIAGNOSIS — M3500 Sicca syndrome, unspecified: Secondary | ICD-10-CM

## 2021-07-06 NOTE — Patient Instructions (Signed)
?  1. Continue Symbicort 160 - 2 inhalations 2 times per day with spacer ? ?2. Continue nystatin 5 mls swish and spit after 2 times a day after Symbicort ? ?3. Continue Biotene mouth rinse daily ? ?4. Remain away from antihistamine use ? ?5. Use distilled water or saline in bubble oxygen reservoir ? ?6. Return to clinic in 1 year or earlier if needed ?

## 2021-07-13 ENCOUNTER — Other Ambulatory Visit: Payer: Self-pay | Admitting: *Deleted

## 2021-07-13 ENCOUNTER — Ambulatory Visit (INDEPENDENT_AMBULATORY_CARE_PROVIDER_SITE_OTHER): Payer: Medicare Other | Admitting: Internal Medicine

## 2021-07-13 ENCOUNTER — Other Ambulatory Visit: Payer: Self-pay

## 2021-07-13 ENCOUNTER — Encounter: Payer: Self-pay | Admitting: Internal Medicine

## 2021-07-13 ENCOUNTER — Telehealth: Payer: Self-pay | Admitting: Internal Medicine

## 2021-07-13 VITALS — BP 126/80 | HR 93 | Resp 18 | Ht 64.0 in | Wt 199.0 lb

## 2021-07-13 DIAGNOSIS — G5601 Carpal tunnel syndrome, right upper limb: Secondary | ICD-10-CM

## 2021-07-13 DIAGNOSIS — E559 Vitamin D deficiency, unspecified: Secondary | ICD-10-CM

## 2021-07-13 DIAGNOSIS — I10 Essential (primary) hypertension: Secondary | ICD-10-CM

## 2021-07-13 DIAGNOSIS — E785 Hyperlipidemia, unspecified: Secondary | ICD-10-CM

## 2021-07-13 DIAGNOSIS — M5412 Radiculopathy, cervical region: Secondary | ICD-10-CM | POA: Diagnosis not present

## 2021-07-13 DIAGNOSIS — R7303 Prediabetes: Secondary | ICD-10-CM

## 2021-07-13 DIAGNOSIS — E538 Deficiency of other specified B group vitamins: Secondary | ICD-10-CM

## 2021-07-13 DIAGNOSIS — Z0001 Encounter for general adult medical examination with abnormal findings: Secondary | ICD-10-CM

## 2021-07-13 MED ORDER — ALBUTEROL SULFATE HFA 108 (90 BASE) MCG/ACT IN AERS: 2.0000 | INHALATION_SPRAY | RESPIRATORY_TRACT | 1 refills | Status: DC | PRN

## 2021-07-13 MED ORDER — NYSTATIN 100000 UNIT/ML MT SUSP: OROMUCOSAL | 5 refills | Status: DC

## 2021-07-13 MED ORDER — GABAPENTIN 300 MG PO CAPS: ORAL_CAPSULE | ORAL | 1 refills | Status: DC

## 2021-07-13 NOTE — Telephone Encounter (Signed)
Refills have been sent in to patients pharmacy. Called and advised patients daughter, patients daughter verbalized understanding.  ?

## 2021-07-13 NOTE — Telephone Encounter (Signed)
Daughter called in and states that [facility] had an appointment with Dr. Simona Huh on 07/06/2021 and her medication was never called in to the pharmacy.  Wendy Torres would like [facility] Ventolin [facility] [address].  ?

## 2021-07-13 NOTE — Patient Instructions (Signed)
Ok to increase the gabapentin to 600 mg three times per day as long as you dont get knocked out ? ?Please continue all other medications as before, and refills have been done if requested. ? ?Please have the pharmacy call with any other refills you may need. ? ?Please continue your efforts at being more active, low cholesterol diet, and weight control. ? ?You are otherwise up to date with prevention measures today. ? ?Please keep your appointments with your specialists as you may have planned ? ?Please go to the LAB at the blood drawing area for the tests to be done  - at the [facility] when you can ? ?You will be contacted by phone if any changes need to be made immediately.  Otherwise, you will receive a letter about your results with an explanation, but please check with MyChart first. ? ?Please remember to sign up for MyChart if you have not done so, as this will be important to you in the future with finding out test results, communicating by private email, and scheduling acute appointments online when needed. ? ?Please make an Appointment to return in 6 months, or sooner if needed ?

## 2021-07-13 NOTE — Progress Notes (Signed)
Patient ID: Wendy Torres, female   DOB: 1940/11/30, 81 y.o.   MRN: [id number] ? ? ? ?     Chief Complaint:: wellness exam and 6 month f/u (Numbness to her fingers on her right hand. Concerned that it may be arthritis. ) ?, left neck and shoulder pain,  ? ?     HPI:  Wendy Torres is a 81 y.o. female here for wellness exam, decliens shingrix, tdap, o/w up to date ?         ?              Also c/o 2 wks sudden worseing intermittetn mod to occasinally severe left neck pain burning with radiation to the left upper back, shoulder and left upper arm, with some tingling to the left fingertips but no worsening weakness or loss of grip strength  Nothing seems to make better or worse   Also has incidentally mild to mod numbness and tingling to most of fingers of right hand, intermittent but more on than off, 1 mo, not really a pain and not weak as well or loss of right grip  Worse in the  ?AM to wake up, worse to use the hand during the day as well.  Pt denies chest pain, increased sob or doe, wheezing, orthopnea, PND, increased LE swelling, palpitations, dizziness or syncope.  Pt denies polydipsia, polyuria, or new focal neuro s/s.   Pt denies fever, wt loss, night sweats, loss of appetite, or other constitutional symptoms  Remains on home o2.   ?  ?Wt Readings from Last 3 Encounters:  ?07/13/21 199 lb (90.3 kg)  ?07/06/21 195 lb 12.8 oz (88.8 kg)  ?07/01/21 194 lb 6.4 oz (88.2 kg)  ? ?BP Readings from Last 3 Encounters:  ?07/13/21 126/80  ?07/06/21 (!) 120/50  ?07/01/21 126/70  ? ?Immunization History  ?Administered Date(s) Administered  ? Fluad Quad(high Dose 65+) 01/29/2021  ? Influenza Split 04/07/2011, 02/28/2012  ? Influenza Whole 04/15/2004, 04/18/2007, 01/27/2010  ? Influenza, High Dose Seasonal PF 02/04/2018, 12/10/2018  ? Influenza,inj,Quad PF,6+ Mos 03/16/2020  ? Influenza,inj,quad, With Preservative 01/29/2017  ? PFIZER(Purple Top)SARS-COV-2 Vaccination 09/14/2019, 10/12/2019  ? Pneumococcal Conjugate-13  12/03/2013  ? Pneumococcal Polysaccharide-23 05/02/2003, 09/29/2008, 12/10/2018  ? Tdap 04/07/2011  ? ?There are no preventive care reminders to display for this patient. ? ?  ? ?Past Medical History:  ?Diagnosis Date  ? ABNORMAL CHEST XRAY 11/05/2008  ? ACUTE URIS OF UNSPECIFIED SITE 10/22/2007  ? ALLERGIC RHINITIS 12/09/2006  ? ANEMIA 01/14/2009  ? Anxiety   ? Arthritis   ? ASTHMA 12/09/2006  ? Asthma   ? Blood transfusion without reported diagnosis   ? Cataract   ? bilateral -removed  ? CHF (congestive heart failure) ([place])   ? Chronic pain syndrome 02/16/2009  ? COPD 12/09/2006  ? DEGENERATIVE JOINT DISEASE 12/09/2006  ? DIZZINESS 03/30/2010  ? DYSPNEA 01/27/2010  ? with heavy exertion  ? FREQUENCY, URINARY 03/30/2010  ? GERD 12/09/2006  ? Glossitis 01/14/2009  ? HELICOBACTER PYLORI GASTRITIS, HX OF 12/09/2006  ? HIATAL HERNIA 03/01/2010  ? History of hiatal hernia   ? HYPERLIPIDEMIA 04/18/2007  ? HYPERSOMNIA 08/31/2008  ? HYPERTENSION 12/09/2006  ? INSOMNIA-SLEEP DISORDER-UNSPEC 11/05/2008  ? LOW BACK PAIN 12/09/2006  ? Morbid obesity ([place]) 12/09/2006  ? Nonspecific (abnormal) findings on radiological and other examination of body structure 11/05/2008  ? OSTEOPOROSIS 12/09/2006  ? OVERACTIVE BLADDER 04/14/2008  ? PALPITATIONS, RECURRENT 01/27/2010  ? Stroke [facility])   ? pt.  stated light one years ago  ? SYNCOPE 11/05/2008  ? TRANSIENT ISCHEMIC ATTACK, HX OF 12/09/2006  ? ?Past Surgical History:  ?Procedure Laterality Date  ? COLONOSCOPY    ? LUMBAR DISC SURGERY    ? TONSILLECTOMY    ? as a child  ? TOTAL HIP ARTHROPLASTY Right 11/06/2012  ? Procedure: RIGHT TOTAL HIP ARTHROPLASTY WITH ACETABULAR AUTOGRAFT;  Surgeon: Gearlean Alf, MD;  Location: [facility];  Service: Orthopedics;  Laterality: Right;  ? TOTAL HIP ARTHROPLASTY Left 02/18/2014  ? Procedure: LEFT TOTAL HIP ARTHROPLASTY;  Surgeon: Gearlean Alf, MD;  Location: [facility];  Service: Orthopedics;  Laterality: Left;  ? TUBAL LIGATION    ? ? reports that she quit smoking about 48 years  ago. Her smoking use included cigarettes. She has a 12.50 pack-year smoking history. She has never used smokeless tobacco. She reports that she does not drink alcohol and does not use drugs. ?family history includes Heart disease in her mother; Hypertension in her brother; Lung cancer in her father. ?Allergies  ?Allergen Reactions  ? Chlorthalidone   ? ?Current Outpatient Medications on File Prior to Visit  ?Medication Sig Dispense Refill  ? acetaminophen (TYLENOL) 325 MG tablet Take 2 tablets (650 mg total) by mouth every 6 (six) hours as needed for mild pain (or Fever >/= 101). 40 tablet 0  ? albuterol (VENTOLIN HFA) 108 (90 Base) MCG/ACT inhaler Inhale 2 puffs into the lungs every 6 (six) hours as needed for wheezing or shortness of breath.    ? ALPRAZolam (XANAX) 0.25 MG tablet TAKE 1 TABLET BY MOUTH TWICE A DAY AS NEEDED 60 tablet 2  ? amLODipine-benazepril (LOTREL) 10-40 MG capsule TAKE 1 CAPSULE BY MOUTH  DAILY 90 capsule 3  ? cloNIDine (CATAPRES) 0.1 MG tablet TAKE 1 TABLET BY MOUTH 2 TIMES DAILY. 180 tablet 3  ? Fexofenadine HCl (MUCINEX ALLERGY PO) Take by mouth as needed.    ? furosemide (LASIX) 20 MG tablet Take 1 tablet (20 mg total) by mouth every morning. 90 tablet 3  ? hydrALAZINE (APRESOLINE) 100 MG tablet Take 100 mg by mouth 3 (three) times daily.    ? hydrocortisone (ANUSOL-HC) 25 MG suppository Place 1 suppository (25 mg total) rectally every 12 (twelve) hours. 12 suppository 1  ? lovastatin (MEVACOR) 40 MG tablet TAKE 1 TABLET BY MOUTH AT  BEDTIME 90 tablet 3  ? meclizine (ANTIVERT) 12.5 MG tablet TAKE 1 TABLET BY MOUTH 3 TIMES A DAY AS NEEDED FOR DIZZINESS 30 tablet 2  ? nystatin (MYCOSTATIN) 100000 UNIT/ML suspension SWISH AND SPIT 5MLS 2 TIMES DAILY AFTER USING SYMBICORT. 300 mL 5  ? oxybutynin (DITROPAN) 5 MG tablet TAKE 1 TABLET BY MOUTH 3  TIMES DAILY 270 tablet 3  ? pantoprazole (PROTONIX) 40 MG tablet TAKE 1 TABLET BY MOUTH  DAILY 90 tablet 3  ? predniSONE (DELTASONE) 20 MG tablet Take  2 tablets (40 mg total) by mouth daily with breakfast. 8 tablet 0  ? spironolactone (ALDACTONE) 25 MG tablet Take 25 mg by mouth daily.    ? sucralfate (CARAFATE) 1 g tablet Take 1 tablet (1 g total) by mouth 4 (four) times daily -  with meals and at bedtime. 120 tablet 0  ? SYMBICORT 160-4.5 MCG/ACT inhaler TAKE 2 PUFFS BY MOUTH TWICE A DAY 10.2 each 5  ? tiZANidine (ZANAFLEX) 4 MG tablet TAKE 1 TABLET BY MOUTH EVERY 6 HOURS AS NEEDED FOR MUSCLE SPASMS. 50 tablet 1  ? traMADol (ULTRAM) 50 MG tablet  TAKE 1-2 TABLETS (50-100 MG TOTAL) BY MOUTH EVERY 6 (SIX) HOURS AS NEEDED. FOR PAIN 120 tablet 2  ? traZODone (DESYREL) 50 MG tablet TAKE 1/2 TO 1 TABLET BY MOUTH AT BEDTIME AS NEEDED FOR SLEEP 90 tablet 1  ? ?No current facility-administered medications on file prior to visit.  ? ?     ROS:  All others reviewed and negative. ? ?Objective  ? ?     PE:  BP 126/80   Pulse 93   Resp 18   Ht '5\' 4"'$  (1.626 m)   Wt 199 lb (90.3 kg)   SpO2 93%   BMI 34.16 kg/m?  ? ?              Constitutional: Pt appears in NAD ?              HENT: Head: NCAT.  ?              Right Ear: External ear normal.   ?              Left Ear: External ear normal.  ?              Eyes: . Pupils are equal, round, and reactive to light. Conjunctivae and EOM are normal ?              Nose: without d/c or deformity ?              Neck: Neck supple. Gross normal ROM ?              Cardiovascular: Normal rate and regular rhythm.   ?              Pulmonary/Chest: Effort normal and breath sounds without rales or wheezing.  ?              Abd:  Soft, NT, ND, + BS, no organomegaly ?              Neurological: Pt is alert. At baseline orientation, motor grossly intact ?              Skin: Skin is warm. No rashes, no other new lesions, LE edema - none ?              Psychiatric: Pt behavior is normal without agitation  ? ?Micro: none ? ?Cardiac tracings I have personally interpreted today:  none ? ?Pertinent Radiological findings (summarize): none  ? ?Lab  Results  ?Component Value Date  ? WBC 5.9 06/12/2021  ? HGB 11.1 (L) 06/12/2021  ? HCT 34.7 (L) 06/12/2021  ? PLT 267 06/12/2021  ? GLUCOSE 97 06/12/2021  ? CHOL 164 12/01/2020  ? TRIG 70.0 12/01/2020  ? HDL 71.30 08/03/

## 2021-07-14 ENCOUNTER — Other Ambulatory Visit (INDEPENDENT_AMBULATORY_CARE_PROVIDER_SITE_OTHER): Payer: Medicare Other

## 2021-07-14 ENCOUNTER — Encounter: Payer: Self-pay | Admitting: Internal Medicine

## 2021-07-14 DIAGNOSIS — E559 Vitamin D deficiency, unspecified: Secondary | ICD-10-CM | POA: Diagnosis not present

## 2021-07-14 DIAGNOSIS — E785 Hyperlipidemia, unspecified: Secondary | ICD-10-CM | POA: Diagnosis not present

## 2021-07-14 DIAGNOSIS — R7303 Prediabetes: Secondary | ICD-10-CM | POA: Diagnosis not present

## 2021-07-14 DIAGNOSIS — E538 Deficiency of other specified B group vitamins: Secondary | ICD-10-CM | POA: Diagnosis not present

## 2021-07-14 LAB — BASIC METABOLIC PANEL
BUN: 12 mg/dL (ref 6–23)
CO2: 29 mEq/L (ref 19–32)
Calcium: 10.9 mg/dL — ABNORMAL HIGH (ref 8.4–10.5)
Chloride: 95 mEq/L — ABNORMAL LOW (ref 96–112)
Creatinine, Ser: 1.08 mg/dL (ref 0.40–1.20)
GFR: 47.85 mL/min — ABNORMAL LOW (ref >60.00)
Glucose, Bld: 94 mg/dL (ref 70–99)
Potassium: 4.6 mEq/L (ref 3.5–5.1)
Sodium: 131 mEq/L — ABNORMAL LOW (ref 135–145)

## 2021-07-14 LAB — URINALYSIS, ROUTINE W REFLEX MICROSCOPIC
Bilirubin Urine: NEGATIVE
Hgb urine dipstick: NEGATIVE
Ketones, ur: NEGATIVE
Leukocytes,Ua: NEGATIVE
Nitrite: NEGATIVE
RBC / HPF: NONE SEEN (ref 0–?)
Specific Gravity, Urine: <=1.005 — AB (ref 1.000–1.030)
Total Protein, Urine: NEGATIVE
Urine Glucose: NEGATIVE
Urobilinogen, UA: 0.2 (ref 0.0–1.0)
WBC, UA: NONE SEEN (ref 0–?)
pH: 6.5 (ref 5.0–8.0)

## 2021-07-14 LAB — VITAMIN D 25 HYDROXY (VIT D DEFICIENCY, FRACTURES): VITD: 35.06 ng/mL (ref 30.00–100.00)

## 2021-07-14 LAB — LIPID PANEL
Cholesterol: 155 mg/dL (ref 0–200)
HDL: 69 mg/dL (ref >39.00)
LDL Cholesterol: 75 mg/dL (ref 0–99)
NonHDL: 86.42
Total CHOL/HDL Ratio: 2
Triglycerides: 59 mg/dL (ref 0.0–149.0)
VLDL: 11.8 mg/dL (ref 0.0–40.0)

## 2021-07-14 LAB — CBC WITH DIFFERENTIAL/PLATELET
Basophils Absolute: 0.1 10*3/uL (ref 0.0–0.1)
Basophils Relative: 1.3 % (ref 0.0–3.0)
Eosinophils Absolute: 0.4 10*3/uL (ref 0.0–0.7)
Eosinophils Relative: 5.8 % — ABNORMAL HIGH (ref 0.0–5.0)
HCT: 31 % — ABNORMAL LOW (ref 36.0–46.0)
Hemoglobin: 10.1 g/dL — ABNORMAL LOW (ref 12.0–15.0)
Lymphocytes Relative: 27.5 % (ref 12.0–46.0)
Lymphs Abs: 1.8 10*3/uL (ref 0.7–4.0)
MCHC: 32.7 g/dL (ref 30.0–36.0)
MCV: 83.1 fl (ref 78.0–100.0)
Monocytes Absolute: 1 10*3/uL (ref 0.1–1.0)
Monocytes Relative: 14.8 % — ABNORMAL HIGH (ref 3.0–12.0)
Neutro Abs: 3.3 10*3/uL (ref 1.4–7.7)
Neutrophils Relative %: 50.6 % (ref 43.0–77.0)
Platelets: 266 10*3/uL (ref 150.0–400.0)
RBC: 3.73 Mil/uL — ABNORMAL LOW (ref 3.87–5.11)
RDW: 15.1 % (ref 11.5–15.5)
WBC: 6.5 10*3/uL (ref 4.0–10.5)

## 2021-07-14 LAB — VITAMIN B12: Vitamin B-12: 342 pg/mL (ref 211–911)

## 2021-07-14 LAB — HEPATIC FUNCTION PANEL
ALT: 9 U/L (ref 0–35)
AST: 13 U/L (ref 0–37)
Albumin: 4.1 g/dL (ref 3.5–5.2)
Alkaline Phosphatase: 58 U/L (ref 39–117)
Bilirubin, Direct: 0 mg/dL (ref 0.0–0.3)
Total Bilirubin: 0.3 mg/dL (ref 0.2–1.2)
Total Protein: 7.8 g/dL (ref 6.0–8.3)

## 2021-07-14 LAB — HEMOGLOBIN A1C: Hgb A1c MFr Bld: 6.1 % (ref 4.6–6.5)

## 2021-07-14 LAB — TSH: TSH: 0.62 u[IU]/mL (ref 0.35–5.50)

## 2021-07-14 NOTE — Assessment & Plan Note (Signed)
BP Readings from Last 3 Encounters:  ?07/13/21 126/80  ?07/06/21 (!) 120/50  ?07/01/21 126/70  ? ?Stable, pt to continue medical treatment lotrel ? ?

## 2021-07-14 NOTE — Assessment & Plan Note (Addendum)
Mild, for right wrist splint at night, increased gabapentin may help, consider hand surgury referral if persistent or worsening but declines for now ?

## 2021-07-14 NOTE — Assessment & Plan Note (Signed)
Age and sex appropriate education and counseling updated with regular exercise and diet ?Referrals for preventative services - none needed ?Immunizations addressed - declines shingrix, covid booster ?Smoking counseling  - none needed ?Evidence for depression or other mood disorder - none significant ?Most recent labs reviewed. ?I have personally reviewed and have noted: ?1) the patient's medical and social history ?2) The patient's current medications and supplements ?3) The patient's height, weight, and BMI have been recorded in the chart ? ?

## 2021-07-14 NOTE — Assessment & Plan Note (Signed)
Lab Results  ?Component Value Date  ? HGBA1C 5.6 12/01/2020  ? ?Stable, pt to continue current medical treatment  - diet ? ?

## 2021-07-14 NOTE — Assessment & Plan Note (Signed)
Lab Results  ?Component Value Date  ? [place] 78 12/01/2020  ? ?Mild uncontrolled, goal ldl < 70,, pt to continue current statin lovastatin 40 as declines change today ? ?

## 2021-07-14 NOTE — Assessment & Plan Note (Addendum)
Today with recent worsening pain but no neuro change, for increased gaabapentin 600 tid; consdier mri c spine but declines for now ?

## 2021-07-14 NOTE — Addendum Note (Signed)
Addended by: Biagio Borg on: 07/14/2021 01:51 PM ? ? Modules accepted: Orders ? ?

## 2021-07-18 ENCOUNTER — Other Ambulatory Visit: Payer: Self-pay | Admitting: Internal Medicine

## 2021-07-18 NOTE — Telephone Encounter (Signed)
Please refill as per office routine med refill policy (all routine meds to be refilled for 3 mo or monthly (per pt preference) up to one year from last visit, then month to month grace period for 3 mo, then further med refills will have to be denied) ? ?

## 2021-07-27 ENCOUNTER — Other Ambulatory Visit: Payer: Self-pay | Admitting: Allergy and Immunology

## 2021-08-06 ENCOUNTER — Other Ambulatory Visit: Payer: Self-pay | Admitting: Internal Medicine

## 2021-08-15 ENCOUNTER — Encounter: Payer: Self-pay | Admitting: Internal Medicine

## 2021-08-15 MED ORDER — BENZONATATE 100 MG PO CAPS: 100.0000 mg | ORAL_CAPSULE | Freq: Three times a day (TID) | ORAL | 1 refills | Status: AC | PRN

## 2021-09-01 ENCOUNTER — Telehealth: Payer: Self-pay | Admitting: Internal Medicine

## 2021-09-01 MED ORDER — TRIAMCINOLONE ACETONIDE 0.1 % EX CREA: 1.0000 "application " | TOPICAL_CREAM | Freq: Two times a day (BID) | CUTANEOUS | 0 refills | Status: DC

## 2021-09-01 NOTE — Telephone Encounter (Signed)
Ok I [facility] ?

## 2021-09-01 NOTE — Telephone Encounter (Signed)
Pt daughter called in and states pt has eczema all over and some places are worse than others.  ? ?Wants to know if there is a cream that can help with relief.  ? ?Please call with recommendations.  ?

## 2021-09-01 NOTE — Telephone Encounter (Signed)
Patient's daughter notified.

## 2021-09-07 ENCOUNTER — Other Ambulatory Visit: Payer: Self-pay | Admitting: Internal Medicine

## 2021-09-07 DIAGNOSIS — M150 Primary generalized (osteo)arthritis: Secondary | ICD-10-CM

## 2021-09-07 DIAGNOSIS — M159 Polyosteoarthritis, unspecified: Secondary | ICD-10-CM

## 2021-09-13 ENCOUNTER — Other Ambulatory Visit: Payer: Self-pay

## 2021-09-13 ENCOUNTER — Telehealth: Payer: Self-pay | Admitting: Allergy and Immunology

## 2021-09-13 MED ORDER — SPACER/AERO-HOLDING CHAMBERS DEVI: 0 refills | Status: DC

## 2021-09-13 NOTE — Telephone Encounter (Signed)
Sent in an order for the spacer that the daughter requested to the pharmacy requested. Called and let the daughter know. ?

## 2021-09-13 NOTE — Telephone Encounter (Signed)
Patient daughter called and needs to have a spacer [facility] [address].[phone] ?

## 2021-09-13 NOTE — Progress Notes (Signed)
Per patient's daughter's request. [facility] [address]. Called patient's daughter to let her know of this.   ?

## 2021-10-14 ENCOUNTER — Other Ambulatory Visit: Payer: Self-pay | Admitting: Internal Medicine

## 2021-10-14 NOTE — Telephone Encounter (Signed)
Please refill as per office routine med refill policy (all routine meds to be refilled for 3 mo or monthly (per pt preference) up to one year from last visit, then month to month grace period for 3 mo, then further med refills will have to be denied) ? ?

## 2021-10-17 ENCOUNTER — Telehealth: Payer: Self-pay | Admitting: Internal Medicine

## 2021-10-17 NOTE — Telephone Encounter (Signed)
N/A unable to leave a message for patient to call back to schedule Medicare Annual Wellness Visit   Last AWV  10/15/20  Please schedule at anytime with [facility] if patient calls the office back.     Any questions, please call me at [phone]

## 2021-10-19 ENCOUNTER — Other Ambulatory Visit: Payer: Self-pay | Admitting: Internal Medicine

## 2021-10-27 ENCOUNTER — Ambulatory Visit: Payer: Medicare Other | Admitting: Internal Medicine

## 2021-10-30 ENCOUNTER — Other Ambulatory Visit: Payer: Self-pay | Admitting: Internal Medicine

## 2021-11-02 ENCOUNTER — Encounter: Payer: Self-pay | Admitting: Internal Medicine

## 2021-11-02 ENCOUNTER — Ambulatory Visit (INDEPENDENT_AMBULATORY_CARE_PROVIDER_SITE_OTHER): Payer: Medicare Other | Admitting: Internal Medicine

## 2021-11-02 VITALS — BP 148/50 | HR 86 | Temp 98.7°F | Ht 64.0 in | Wt 201.0 lb

## 2021-11-02 DIAGNOSIS — G5601 Carpal tunnel syndrome, right upper limb: Secondary | ICD-10-CM

## 2021-11-02 DIAGNOSIS — I10 Essential (primary) hypertension: Secondary | ICD-10-CM

## 2021-11-02 DIAGNOSIS — E559 Vitamin D deficiency, unspecified: Secondary | ICD-10-CM

## 2021-11-02 DIAGNOSIS — K2100 Gastro-esophageal reflux disease with esophagitis, without bleeding: Secondary | ICD-10-CM

## 2021-11-02 MED ORDER — PANTOPRAZOLE SODIUM 40 MG PO TBEC: 40.0000 mg | DELAYED_RELEASE_TABLET | Freq: Two times a day (BID) | ORAL | 3 refills | Status: DC

## 2021-11-02 MED ORDER — ALPRAZOLAM 0.25 MG PO TABS: 0.2500 mg | ORAL_TABLET | Freq: Two times a day (BID) | ORAL | 5 refills | Status: DC | PRN

## 2021-11-02 NOTE — Assessment & Plan Note (Signed)
Last vitamin D Lab Results  Component Value Date   VD25OH 35.06 07/14/2021   Low, to increase to 2000 u qd oral replacement

## 2021-11-02 NOTE — Progress Notes (Signed)
Patient ID: Wendy Torres, female   DOB: 02/23/1941, 81 y.o.   MRN: [id number]        Chief Complaint: follow up GERD, htn, right CTS and right elbow contracture, low vit d       HPI:  Wendy Torres is a 81 y.o. female here with daughter; Has had mild worsening reflux, but no abd pain, dysphagia, n/v, bowel change or blood.  Did not realize her PPI had been increased to bid last visit.  BP has been < 140/90 at home.  Taking Vit D 1000 u qd.  Stil has persistent maybe worsening right hand numbness and pain, as well as chronic right elbow contracture, unable to completely extend.  Pt denies chest pain, increased sob or doe, wheezing, orthopnea, PND, increased LE swelling, palpitations, dizziness or syncope.   Pt denies polydipsia, polyuria, or new focal neuro s/s.          Wt Readings from Last 3 Encounters:  11/02/21 201 lb (91.2 kg)  07/13/21 199 lb (90.3 kg)  07/06/21 195 lb 12.8 oz (88.8 kg)   BP Readings from Last 3 Encounters:  11/02/21 (!) 148/50  07/13/21 126/80  07/06/21 (!) 120/50         Past Medical History:  Diagnosis Date   ABNORMAL CHEST XRAY 11/05/2008   ACUTE URIS OF UNSPECIFIED SITE 10/22/2007   ALLERGIC RHINITIS 12/09/2006   ANEMIA 01/14/2009   Anxiety    Arthritis    ASTHMA 12/09/2006   Asthma    Blood transfusion without reported diagnosis    Cataract    bilateral -removed   CHF (congestive heart failure) ([place])    Chronic pain syndrome 02/16/2009   COPD 12/09/2006   DEGENERATIVE JOINT DISEASE 12/09/2006   DIZZINESS 03/30/2010   DYSPNEA 01/27/2010   with heavy exertion   FREQUENCY, URINARY 03/30/2010   GERD 12/09/2006   Glossitis 9/37/1696   HELICOBACTER PYLORI GASTRITIS, HX OF 12/09/2006   HIATAL HERNIA 03/01/2010   History of hiatal hernia    HYPERLIPIDEMIA 04/18/2007   HYPERSOMNIA 08/31/2008   HYPERTENSION 12/09/2006   INSOMNIA-SLEEP DISORDER-UNSPEC 11/05/2008   LOW BACK PAIN 12/09/2006   Morbid obesity ([place]) 12/09/2006   Nonspecific (abnormal) findings on  radiological and other examination of body structure 11/05/2008   OSTEOPOROSIS 12/09/2006   OVERACTIVE BLADDER 04/14/2008   PALPITATIONS, RECURRENT 01/27/2010   Stroke ([place])    pt. stated light one years ago   SYNCOPE 11/05/2008   TRANSIENT ISCHEMIC ATTACK, HX OF 12/09/2006   Past Surgical History:  Procedure Laterality Date   COLONOSCOPY     LUMBAR DISC SURGERY     TONSILLECTOMY     as a child   TOTAL HIP ARTHROPLASTY Right 11/06/2012   Procedure: RIGHT TOTAL HIP ARTHROPLASTY WITH ACETABULAR AUTOGRAFT;  Surgeon: Gearlean Alf, MD;  Location: [facility];  Service: Orthopedics;  Laterality: Right;   TOTAL HIP ARTHROPLASTY Left 02/18/2014   Procedure: LEFT TOTAL HIP ARTHROPLASTY;  Surgeon: Gearlean Alf, MD;  Location: [facility];  Service: Orthopedics;  Laterality: Left;   TUBAL LIGATION      reports that she quit smoking about 49 years ago. Her smoking use included cigarettes. She has a 12.50 pack-year smoking history. She has never used smokeless tobacco. She reports that she does not drink alcohol and does not use drugs. family history includes Heart disease in her mother; Hypertension in her brother; Lung cancer in her father. Allergies  Allergen Reactions   Chlorthalidone    Current Outpatient  Medications on File Prior to Visit  Medication Sig Dispense Refill   acetaminophen (TYLENOL) 325 MG tablet Take 2 tablets (650 mg total) by mouth every 6 (six) hours as needed for mild pain (or Fever >/= 101). 40 tablet 0   albuterol (VENTOLIN HFA) 108 (90 Base) MCG/ACT inhaler Inhale 2 puffs into the lungs every 6 (six) hours as needed for wheezing or shortness of breath.     albuterol (VENTOLIN HFA) 108 (90 Base) MCG/ACT inhaler INHALE 2 PUFFS INTO THE LUNGS EVERY 4 HOURS AS NEEDED FOR WHEEZING OR SHORTNESS OF BREATH. 8.5 each 1   amLODipine-benazepril (LOTREL) 10-40 MG capsule TAKE 1 CAPSULE BY MOUTH  DAILY 90 capsule 3   benzonatate (TESSALON PERLES) 100 MG capsule Take 1 capsule (100 mg total) by  mouth 3 (three) times daily as needed for cough. 60 capsule 1   budesonide-formoterol (SYMBICORT) 160-4.5 MCG/ACT inhaler Inhale 2 puffs into the lungs 2 (two) times daily. 10.2 g 6   cloNIDine (CATAPRES) 0.1 MG tablet TAKE 1 TABLET BY MOUTH 2 TIMES DAILY. 180 tablet 3   Fexofenadine HCl (MUCINEX ALLERGY PO) Take by mouth as needed.     furosemide (LASIX) 20 MG tablet Take 1 tablet (20 mg total) by mouth every morning. 90 tablet 3   gabapentin (NEURONTIN) 300 MG capsule 1-2 tab by mouth three times per day 540 capsule 1   hydrALAZINE (APRESOLINE) 100 MG tablet Take 100 mg by mouth 3 (three) times daily.     hydrocortisone (ANUSOL-HC) 25 MG suppository Place 1 suppository (25 mg total) rectally every 12 (twelve) hours. 12 suppository 1   lovastatin (MEVACOR) 40 MG tablet TAKE 1 TABLET BY MOUTH AT  BEDTIME 90 tablet 3   meclizine (ANTIVERT) 12.5 MG tablet TAKE 1 TABLET BY MOUTH 3 TIMES A DAY AS NEEDED FOR DIZZINESS 30 tablet 2   nystatin (MYCOSTATIN) 100000 UNIT/ML suspension SWISH AND SPIT 5MLS 2 TIMES DAILY AFTER USING SYMBICORT. 300 mL 5   nystatin (MYCOSTATIN) 100000 UNIT/ML suspension Swish and spit 14m two times daily after using Symbicort. 473 mL 5   oxybutynin (DITROPAN) 5 MG tablet TAKE 1 TABLET BY MOUTH 3  TIMES DAILY 270 tablet 3   predniSONE (DELTASONE) 20 MG tablet Take 2 tablets (40 mg total) by mouth daily with breakfast. 8 tablet 0   Spacer/[facility]olding [facility]Use as directed with inhaler. 1 each 0   spironolactone (ALDACTONE) 25 MG tablet Take 25 mg by mouth daily.     sucralfate (CARAFATE) 1 g tablet Take 1 tablet (1 g total) by mouth 4 (four) times daily -  with meals and at bedtime. 120 tablet 0   tiZANidine (ZANAFLEX) 4 MG tablet TAKE 1 TABLET BY MOUTH EVERY 6 HOURS AS NEEDED FOR MUSCLE SPASMS. 50 tablet 1   traMADol (ULTRAM) 50 MG tablet TAKE 1-2 TABLETS (50-100 MG TOTAL) BY MOUTH EVERY 6 (SIX) HOURS AS NEEDED. FOR PAIN 120 tablet 2   traZODone (DESYREL) 50 MG tablet  TAKE 1/2 TO 1 TABLET BY MOUTH AT BEDTIME AS NEEDED FOR SLEEP 90 tablet 1   triamcinolone cream (KENALOG) 0.1 % APPLY 1 APPLICATION. TOPICALLY 2 (TWO) TIMES DAILY. 30 g 0   No current facility-administered medications on file prior to visit.        ROS:  All others reviewed and negative.  Objective        PE:  BP (!) 148/50 (BP Location: Right Arm, Patient Position: Sitting, Cuff Size: Large)   Pulse 86  Temp 98.7 F (37.1 C) (Oral)   Ht '5\' 4"'$  (1.626 m)   Wt 201 lb (91.2 kg)   SpO2 95%   BMI 34.50 kg/m                 Constitutional: Pt appears in NAD               HENT: Head: NCAT.                Right Ear: External ear normal.                 Left Ear: External ear normal.                Eyes: . Pupils are equal, round, and reactive to light. Conjunctivae and EOM are normal               Nose: without d/c or deformity               Neck: Neck supple. Gross normal ROM               Cardiovascular: Normal rate and regular rhythm.                 Pulmonary/Chest: Effort normal and breath sounds without rales or wheezing.                Abd:  Soft, NT, ND, + BS, no organomegaly               Neurological: Pt is alert. At baseline orientation, motor grossly intact               Skin: Skin is warm. No rashes, no other new lesions, LE edema - none               Psychiatric: Pt behavior is normal without agitation   Micro: none  Cardiac tracings I have personally interpreted today:  none  Pertinent Radiological findings (summarize): none   Lab Results  Component Value Date   WBC 6.5 07/14/2021   HGB 10.1 (L) 07/14/2021   HCT 31.0 (L) 07/14/2021   PLT 266.0 07/14/2021   GLUCOSE 94 07/14/2021   CHOL 155 07/14/2021   TRIG 59.0 07/14/2021   HDL 69.00 07/14/2021   LDLCALC 75 07/14/2021   ALT 9 07/14/2021   AST 13 07/14/2021   NA 131 (L) 07/14/2021   K 4.6 07/14/2021   CL 95 (L) 07/14/2021   CREATININE 1.08 07/14/2021   BUN 12 07/14/2021   CO2 29 07/14/2021   TSH 0.62  07/14/2021   INR 1.02 02/10/2014   HGBA1C 6.1 07/14/2021   Assessment/Plan:  SHERRILYN NAIRN is a 81 y.o. Black or African American [2] female with  has a past medical history of ABNORMAL CHEST XRAY (11/05/2008), ACUTE URIS OF UNSPECIFIED SITE (10/22/2007), ALLERGIC RHINITIS (12/09/2006), ANEMIA (01/14/2009), Anxiety, Arthritis, ASTHMA (12/09/2006), Asthma, Blood transfusion without reported diagnosis, Cataract, CHF (congestive heart failure) ([place]), Chronic pain syndrome (02/16/2009), COPD (12/09/2006), DEGENERATIVE JOINT DISEASE (12/09/2006), DIZZINESS (03/30/2010), DYSPNEA (01/27/2010), FREQUENCY, URINARY (03/30/2010), GERD (12/09/2006), Glossitis (5/46/5681), HELICOBACTER PYLORI GASTRITIS, HX OF (12/09/2006), HIATAL HERNIA (03/01/2010), History of hiatal hernia, HYPERLIPIDEMIA (04/18/2007), HYPERSOMNIA (08/31/2008), HYPERTENSION (12/09/2006), INSOMNIA-SLEEP DISORDER-UNSPEC (11/05/2008), LOW BACK PAIN (12/09/2006), Morbid obesity ([place]) (12/09/2006), Nonspecific (abnormal) findings on radiological and other examination of body structure (11/05/2008), OSTEOPOROSIS (12/09/2006), OVERACTIVE BLADDER (04/14/2008), PALPITATIONS, RECURRENT (01/27/2010), Stroke ([place]), SYNCOPE (11/05/2008), and TRANSIENT ISCHEMIC ATTACK, HX OF (12/09/2006).  Carpal tunnel syndrome of right wrist Chronic persistent  maybe worsening, for hand surgury referral, maybe able to address the right elbow contracture as well  Vitamin D deficiency Last vitamin D Lab Results  Component Value Date   VD25OH 35.06 07/14/2021   Low, to increase to 2000 u qd oral replacement   Essential hypertension BP Readings from Last 3 Encounters:  11/02/21 (!) 148/50  07/13/21 126/80  07/06/21 (!) 120/50   Uncontrolled here,, pt to continue medical treatment as is for now, as BP at home controlled per duaghter , continue lotrel 10-40 mg qd, hydralzine 100 tid   GERD Mild uncontrolled, for increase PPI to bid,  to f/u any worsening symptoms or concerns  Followup:  Return in about 4 months (around 03/05/2022).  Cathlean Cower, MD 11/02/2021 7:32 PM [facility]

## 2021-11-02 NOTE — Patient Instructions (Signed)
Ok to increase the protonix to 40 mg twice per day  Please continue all other medications as before, and refills have been done if requested.  Please have the pharmacy call with any other refills you may need.  Please keep your appointments with your specialists as you may have planned  You will be contacted regarding the referral for: hand surgury for the right hand and elbow  We can hold on further labs today  Please make an Appointment to return in 4 months, or sooner if needed

## 2021-11-02 NOTE — Assessment & Plan Note (Signed)
Chronic persistent maybe worsening, for hand surgury referral, maybe able to address the right elbow contracture as well

## 2021-11-02 NOTE — Assessment & Plan Note (Signed)
Mild uncontrolled, for increase PPI to bid,  to f/u any worsening symptoms or concerns

## 2021-11-02 NOTE — Assessment & Plan Note (Signed)
BP Readings from Last 3 Encounters:  11/02/21 (!) 148/50  07/13/21 126/80  07/06/21 (!) 120/50   Uncontrolled here,, pt to continue medical treatment as is for now, as BP at home controlled per duaghter , continue lotrel 10-40 mg qd, hydralzine 100 tid

## 2021-11-03 ENCOUNTER — Other Ambulatory Visit: Payer: Self-pay | Admitting: Internal Medicine

## 2021-11-15 ENCOUNTER — Ambulatory Visit: Payer: Medicare Other | Admitting: Orthopedic Surgery

## 2021-11-24 ENCOUNTER — Ambulatory Visit: Payer: Medicare Other | Admitting: Orthopedic Surgery

## 2021-11-24 DIAGNOSIS — N183 Chronic kidney disease, stage 3 unspecified: Secondary | ICD-10-CM | POA: Diagnosis not present

## 2021-11-29 ENCOUNTER — Other Ambulatory Visit: Payer: Self-pay | Admitting: Internal Medicine

## 2021-11-29 NOTE — Telephone Encounter (Signed)
Please refill as per office routine med refill policy (all routine meds to be refilled for 3 mo or monthly (per pt preference) up to one year from last visit, then month to month grace period for 3 mo, then further med refills will have to be denied) ? ?

## 2021-11-30 DIAGNOSIS — E871 Hypo-osmolality and hyponatremia: Secondary | ICD-10-CM | POA: Diagnosis not present

## 2021-11-30 DIAGNOSIS — N2581 Secondary hyperparathyroidism of renal origin: Secondary | ICD-10-CM | POA: Diagnosis not present

## 2021-11-30 DIAGNOSIS — N183 Chronic kidney disease, stage 3 unspecified: Secondary | ICD-10-CM | POA: Diagnosis not present

## 2021-11-30 DIAGNOSIS — D631 Anemia in chronic kidney disease: Secondary | ICD-10-CM | POA: Diagnosis not present

## 2021-11-30 DIAGNOSIS — I129 Hypertensive chronic kidney disease with stage 1 through stage 4 chronic kidney disease, or unspecified chronic kidney disease: Secondary | ICD-10-CM | POA: Diagnosis not present

## 2021-12-05 DIAGNOSIS — N189 Chronic kidney disease, unspecified: Secondary | ICD-10-CM | POA: Diagnosis not present

## 2021-12-20 DIAGNOSIS — N183 Chronic kidney disease, stage 3 unspecified: Secondary | ICD-10-CM | POA: Diagnosis not present

## 2021-12-22 ENCOUNTER — Telehealth: Payer: Self-pay | Admitting: Internal Medicine

## 2021-12-22 NOTE — Telephone Encounter (Signed)
error 

## 2021-12-23 ENCOUNTER — Ambulatory Visit (INDEPENDENT_AMBULATORY_CARE_PROVIDER_SITE_OTHER): Payer: Medicare Other | Admitting: Internal Medicine

## 2021-12-23 ENCOUNTER — Encounter: Payer: Self-pay | Admitting: Internal Medicine

## 2021-12-23 VITALS — BP 120/60 | HR 86 | Temp 98.1°F | Ht 64.0 in | Wt 203.4 lb

## 2021-12-23 DIAGNOSIS — I10 Essential (primary) hypertension: Secondary | ICD-10-CM | POA: Diagnosis not present

## 2021-12-23 DIAGNOSIS — M159 Polyosteoarthritis, unspecified: Secondary | ICD-10-CM

## 2021-12-23 DIAGNOSIS — E559 Vitamin D deficiency, unspecified: Secondary | ICD-10-CM

## 2021-12-23 DIAGNOSIS — H9012 Conductive hearing loss, unilateral, left ear, with unrestricted hearing on the contralateral side: Secondary | ICD-10-CM | POA: Diagnosis not present

## 2021-12-23 DIAGNOSIS — R1310 Dysphagia, unspecified: Secondary | ICD-10-CM | POA: Diagnosis not present

## 2021-12-23 MED ORDER — TRAMADOL HCL 50 MG PO TABS: 50.0000 mg | ORAL_TABLET | Freq: Four times a day (QID) | ORAL | 5 refills | Status: DC | PRN

## 2021-12-23 NOTE — Progress Notes (Unsigned)
Patient ID: Wendy Torres, female   DOB: 1940/11/09, 81 y.o.   MRN: [id number]        Chief Complaint: follow up dysphagia, left hearing loss, chronic pain, htn, low vit d       HPI:  Wendy Torres is a 81 y.o. female here with c/o new onset mild dysphagia to solids at the lower mid chest level, better with water chaser, Pt denies chest pain, increased sob or doe, wheezing, orthopnea, PND, increased LE swelling, palpitations, dizziness or syncope.   Pt denies polydipsia, polyuria, or new focal neuro s/s.   Pt denies fever, wt loss, night sweats, loss of appetite, or other constitutional symptoms Has chronic pain with multiple joints ongoing, asks for tramadol prn.  Also has reduced hearing left ear in the past wk without HA, fever, ear dc, tinnitus or vertigo.  Not taking Vit d.        Wt Readings from Last 3 Encounters:  12/23/21 203 lb 6.4 oz (92.3 kg)  11/02/21 201 lb (91.2 kg)  07/13/21 199 lb (90.3 kg)   BP Readings from Last 3 Encounters:  12/23/21 120/60  11/02/21 (!) 148/50  07/13/21 126/80         Past Medical History:  Diagnosis Date   ABNORMAL CHEST XRAY 11/05/2008   ACUTE URIS OF UNSPECIFIED SITE 10/22/2007   ALLERGIC RHINITIS 12/09/2006   ANEMIA 01/14/2009   Anxiety    Arthritis    ASTHMA 12/09/2006   Asthma    Blood transfusion without reported diagnosis    Cataract    bilateral -removed   CHF (congestive heart failure) ([place])    Chronic pain syndrome 02/16/2009   COPD 12/09/2006   DEGENERATIVE JOINT DISEASE 12/09/2006   DIZZINESS 03/30/2010   DYSPNEA 01/27/2010   with heavy exertion   FREQUENCY, URINARY 03/30/2010   GERD 12/09/2006   Glossitis 2/84/1324   HELICOBACTER PYLORI GASTRITIS, HX OF 12/09/2006   HIATAL HERNIA 03/01/2010   History of hiatal hernia    HYPERLIPIDEMIA 04/18/2007   HYPERSOMNIA 08/31/2008   HYPERTENSION 12/09/2006   INSOMNIA-SLEEP DISORDER-UNSPEC 11/05/2008   LOW BACK PAIN 12/09/2006   Morbid obesity ([place]) 12/09/2006   Nonspecific (abnormal) findings  on radiological and other examination of body structure 11/05/2008   OSTEOPOROSIS 12/09/2006   OVERACTIVE BLADDER 04/14/2008   PALPITATIONS, RECURRENT 01/27/2010   Stroke ([place])    pt. stated light one years ago   SYNCOPE 11/05/2008   TRANSIENT ISCHEMIC ATTACK, HX OF 12/09/2006   Past Surgical History:  Procedure Laterality Date   COLONOSCOPY     LUMBAR DISC SURGERY     TONSILLECTOMY     as a child   TOTAL HIP ARTHROPLASTY Right 11/06/2012   Procedure: RIGHT TOTAL HIP ARTHROPLASTY WITH ACETABULAR AUTOGRAFT;  Surgeon: Gearlean Alf, MD;  Location: [facility];  Service: Orthopedics;  Laterality: Right;   TOTAL HIP ARTHROPLASTY Left 02/18/2014   Procedure: LEFT TOTAL HIP ARTHROPLASTY;  Surgeon: Gearlean Alf, MD;  Location: [facility];  Service: Orthopedics;  Laterality: Left;   TUBAL LIGATION      reports that she quit smoking about 49 years ago. Her smoking use included cigarettes. She has a 12.50 pack-year smoking history. She has never used smokeless tobacco. She reports that she does not drink alcohol and does not use drugs. family history includes Heart disease in her mother; Hypertension in her brother; Lung cancer in her father. Allergies  Allergen Reactions   Chlorthalidone    Current Outpatient Medications on File  Prior to Visit  Medication Sig Dispense Refill   acetaminophen (TYLENOL) 325 MG tablet Take 2 tablets (650 mg total) by mouth every 6 (six) hours as needed for mild pain (or Fever >/= 101). 40 tablet 0   albuterol (VENTOLIN HFA) 108 (90 Base) MCG/ACT inhaler Inhale 2 puffs into the lungs every 6 (six) hours as needed for wheezing or shortness of breath.     albuterol (VENTOLIN HFA) 108 (90 Base) MCG/ACT inhaler INHALE 2 PUFFS INTO THE LUNGS EVERY 4 HOURS AS NEEDED FOR WHEEZING OR SHORTNESS OF BREATH. 8.5 each 1   ALPRAZolam (XANAX) 0.25 MG tablet Take 1 tablet (0.25 mg total) by mouth 2 (two) times daily as needed. 60 tablet 5   amLODipine-benazepril (LOTREL) 10-40 MG capsule TAKE  1 CAPSULE BY MOUTH  DAILY 90 capsule 3   benzonatate (TESSALON PERLES) 100 MG capsule Take 1 capsule (100 mg total) by mouth 3 (three) times daily as needed for cough. 60 capsule 1   budesonide-formoterol (SYMBICORT) 160-4.5 MCG/ACT inhaler Inhale 2 puffs into the lungs 2 (two) times daily. 10.2 g 6   cloNIDine (CATAPRES) 0.1 MG tablet TAKE 1 TABLET BY MOUTH 2 TIMES DAILY. 180 tablet 3   Fexofenadine HCl (MUCINEX ALLERGY PO) Take by mouth as needed.     furosemide (LASIX) 20 MG tablet Take 1 tablet (20 mg total) by mouth every morning. 90 tablet 3   gabapentin (NEURONTIN) 300 MG capsule 1-2 tab by mouth three times per day 540 capsule 1   hydrALAZINE (APRESOLINE) 100 MG tablet Take 100 mg by mouth 3 (three) times daily.     lovastatin (MEVACOR) 40 MG tablet TAKE 1 TABLET BY MOUTH AT  BEDTIME 90 tablet 3   meclizine (ANTIVERT) 12.5 MG tablet TAKE 1 TABLET BY MOUTH 3 TIMES A DAY AS NEEDED FOR DIZZINESS 30 tablet 2   nystatin (MYCOSTATIN) 100000 UNIT/ML suspension SWISH AND SPIT 5MLS 2 TIMES DAILY AFTER USING SYMBICORT. 300 mL 5   nystatin (MYCOSTATIN) 100000 UNIT/ML suspension Swish and spit 7m two times daily after using Symbicort. 473 mL 5   oxybutynin (DITROPAN) 5 MG tablet TAKE 1 TABLET BY MOUTH 3  TIMES DAILY 270 tablet 3   pantoprazole (PROTONIX) 40 MG tablet TAKE 1 TABLET BY MOUTH  DAILY 90 tablet 2   predniSONE (DELTASONE) 20 MG tablet Take 2 tablets (40 mg total) by mouth daily with breakfast. 8 tablet 0   Sp[facility]olding [facility]Use as directed with inhaler. 1 each 0   spironolactone (ALDACTONE) 25 MG tablet Take 25 mg by mouth daily.     sucralfate (CARAFATE) 1 g tablet Take 1 tablet (1 g total) by mouth 4 (four) times daily -  with meals and at bedtime. 120 tablet 0   tiZANidine (ZANAFLEX) 4 MG tablet TAKE 1 TABLET BY MOUTH EVERY 6 HOURS AS NEEDED FOR MUSCLE SPASMS. 50 tablet 1   traZODone (DESYREL) 50 MG tablet TAKE 1/2 TO 1 TABLET BY MOUTH AT BEDTIME AS NEEDED FOR SLEEP 90  tablet 1   triamcinolone cream (KENALOG) 0.1 % APPLY 1 APPLICATION. TOPICALLY 2 (TWO) TIMES DAILY. 30 g 0   No current facility-administered medications on file prior to visit.        ROS:  All others reviewed and negative.  Objective        PE:  BP 120/60 (BP Location: Right Arm, Patient Position: Sitting, Cuff Size: Normal)   Pulse 86   Temp 98.1 F (36.7 C) (Oral)   Ht 5'  4" (1.626 m)   Wt 203 lb 6.4 oz (92.3 kg)   SpO2 90%   BMI 34.91 kg/m                 Constitutional: Pt appears in NAD               HENT: Head: NCAT.                Right Ear: External ear normal.                 Left Ear: Extenal ear normal. Left canal impactions resolved with irrigation               Eyes: . Pupils are equal, round, and reactive to light. Conjunctivae and EOM are normal               Nose: without d/c or deformity               Neck: Neck supple. Gross normal ROM               Cardiovascular: Normal rate and regular rhythm.                 Pulmonary/Chest: Effort normal and breath sounds without rales or wheezing.                Abd:  Soft, NT, ND, + BS, no organomegaly               Neurological: Pt is alert. At baseline orientation, motor grossly intact               Skin: Skin is warm. No rashes, no other new lesions, LE edema - none               Psychiatric: Pt behavior is normal without agitation   Micro: none  Cardiac tracings I have personally interpreted today:  none  Pertinent Radiological findings (summarize): none   Lab Results  Component Value Date   WBC 6.5 07/14/2021   HGB 10.1 (L) 07/14/2021   HCT 31.0 (L) 07/14/2021   PLT 266.0 07/14/2021   GLUCOSE 94 07/14/2021   CHOL 155 07/14/2021   TRIG 59.0 07/14/2021   HDL 69.00 07/14/2021   LDLCALC 75 07/14/2021   ALT 9 07/14/2021   AST 13 07/14/2021   NA 131 (L) 07/14/2021   K 4.6 07/14/2021   CL 95 (L) 07/14/2021   CREATININE 1.08 07/14/2021   BUN 12 07/14/2021   CO2 29 07/14/2021   TSH 0.62 07/14/2021   INR  1.02 02/10/2014   HGBA1C 6.1 07/14/2021   Assessment/Plan:  Wendy Torres is a 81 y.o. Black or African American [2] female with  has a past medical history of ABNORMAL CHEST XRAY (11/05/2008), ACUTE URIS OF UNSPECIFIED SITE (10/22/2007), ALLERGIC RHINITIS (12/09/2006), ANEMIA (01/14/2009), Anxiety, Arthritis, ASTHMA (12/09/2006), Asthma, Blood transfusion without reported diagnosis, Cataract, CHF (congestive heart failure) ([place]), Chronic pain syndrome (02/16/2009), COPD (12/09/2006), DEGENERATIVE JOINT DISEASE (12/09/2006), DIZZINESS (03/30/2010), DYSPNEA (01/27/2010), FREQUENCY, URINARY (03/30/2010), GERD (12/09/2006), Glossitis (06/02/5425), HELICOBACTER PYLORI GASTRITIS, HX OF (12/09/2006), HIATAL HERNIA (03/01/2010), History of hiatal hernia, HYPERLIPIDEMIA (04/18/2007), HYPERSOMNIA (08/31/2008), HYPERTENSION (12/09/2006), INSOMNIA-SLEEP DISORDER-UNSPEC (11/05/2008), LOW BACK PAIN (12/09/2006), Morbid obesity ([place]) (12/09/2006), Nonspecific (abnormal) findings on radiological and other examination of body structure (11/05/2008), OSTEOPOROSIS (12/09/2006), OVERACTIVE BLADDER (04/14/2008), PALPITATIONS, RECURRENT (01/27/2010), Stroke ([place]), SYNCOPE (11/05/2008), and TRANSIENT ISCHEMIC ATTACK, HX OF (12/09/2006).  Vitamin D deficiency Last vitamin D Lab Results  Component Value  Date   VD25OH 35.06 07/14/2021   Low, to start oral replacement   Left ear hearing loss Left canal impactions resolved with irrigation  Ceruminosis is noted.  Wax is removed by syringing and manual debridement. Instructions for home care to prevent wax buildup are given.   Essential hypertension BP Readings from Last 3 Encounters:  12/23/21 120/60  11/02/21 (!) 148/50  07/13/21 126/80   Stable, pt to continue medical treatment lotrel 10-40 mg qd, catapres 0.1 bid   Osteoarthritis With chronic pain, for tramadol prn  Dysphagia Recent onset, cant r/o malignancy - for GI referral  Followup: Return in about 6 months (around  06/25/2022).  Cathlean Cower, MD 12/27/2021 7:00 PM [facility]

## 2021-12-23 NOTE — Patient Instructions (Addendum)
Please continue all other medications as before, and refills have been done if requested - the tramadol  Please have your Shingrix (shingles) shots done at your local pharmacy.  Please have the pharmacy call with any other refills you may need.  Please continue your efforts at being more active, low cholesterol diet, and weight control.  Please keep your appointments with your specialists as you may have planned  You will be contacted regarding the referral for: GI for the swallowing  We can hold on lab testing today  Please make an Appointment to return in 6 months, or sooner if needed

## 2021-12-27 ENCOUNTER — Encounter: Payer: Self-pay | Admitting: Internal Medicine

## 2021-12-27 DIAGNOSIS — R1310 Dysphagia, unspecified: Secondary | ICD-10-CM | POA: Insufficient documentation

## 2021-12-27 DIAGNOSIS — H9192 Unspecified hearing loss, left ear: Secondary | ICD-10-CM | POA: Insufficient documentation

## 2021-12-27 NOTE — Assessment & Plan Note (Signed)
BP Readings from Last 3 Encounters:  12/23/21 120/60  11/02/21 (!) 148/50  07/13/21 126/80   Stable, pt to continue medical treatment lotrel 10-40 mg qd, catapres 0.1 bid

## 2021-12-27 NOTE — Assessment & Plan Note (Signed)
With chronic pain, for tramadol prn

## 2021-12-27 NOTE — Assessment & Plan Note (Signed)
Last vitamin D Lab Results  Component Value Date   VD25OH 35.06 07/14/2021   Low, to start oral replacement

## 2021-12-27 NOTE — Assessment & Plan Note (Addendum)
Recent onset, cant r/o malignancy - for GI referral

## 2021-12-27 NOTE — Assessment & Plan Note (Signed)
Left canal impactions resolved with irrigation  Ceruminosis is noted.  Wax is removed by syringing and manual debridement. Instructions for home care to prevent wax buildup are given.

## 2021-12-28 ENCOUNTER — Other Ambulatory Visit: Payer: Self-pay | Admitting: Internal Medicine

## 2022-01-06 ENCOUNTER — Encounter: Payer: Self-pay | Admitting: Physician Assistant

## 2022-01-26 ENCOUNTER — Other Ambulatory Visit: Payer: Self-pay | Admitting: Internal Medicine

## 2022-01-30 ENCOUNTER — Other Ambulatory Visit: Payer: Self-pay | Admitting: Internal Medicine

## 2022-01-31 NOTE — Progress Notes (Unsigned)
02/01/2022 Wendy Torres [id number] 03-Mar-1941  Referring provider: Biagio Borg, MD Primary GI doctor: Dr. Rush Landmark (Dr. Ardis Hughs)  ASSESSMENT AND PLAN:   Dysphagia, unspecified type with history of GERD S/p dilatation 2011, last EGD 2016 unremarkable Sounds more intermittent, possibly from GERD, has also had improvement with new dentures, high risk for EGD with COPD with chronic respiratory failure on 02 Start with barium swallow, if needs EGD will get scheduled at hospital Given dysmotility/dysphagia guidelines Can consider empirically treating yeast esophagitis but no odynophagia, no plaques seen on exam, will wait until after barium  Thrush/Glossitis Get on B12, continue nystatin wash from PCP No odynophagia, can consider diflucan 10 day treatment for esophagitis pending barium swallow  Chronic idiopathic constipation - Increase fiber/ water intake, decrease caffeine, increase activity level. -Will add on Miralax twice a day - no obstructive symptoms at this time, likely component of pelvic floor with history of 10 kids  Chronic respiratory failure with hypoxia (HCC) secondary to COPD On chronic 2-3 L [place] If barium swallow shows stricture or mass will need to have done at the hospital Discussed increase risk with pulmonary history  History of tubular adenoma 2016 Colonoscopy excellent prep 1 tubular adenoma polyp 4 mm transverse colon, internal hemorrhoids, no recall secondary to age.   Patient Care Team: Biagio Borg, MD as PCP - General  HISTORY OF PRESENT ILLNESS: 81 y.o. female with a past medical history of osteoporosis, chronic pain syndrome, GERD with history of H. pylori gastritis and hiatal hernia, IDA , COPD, chronic hypoxia on 2-3 L,, CHF, obesity and others listed below presents for evaluation of dysphagia.   2012 colonoscopy Dr. Sharlett Iles unremarkable 2011 EGD with Dr. Sharlett Iles normal except for occult stricture at [place] junction dilated 2016  endoscopy and colonoscopy for IDA with Dr. Ardis Hughs EGD showed mild nonspecific distal gastritis, pathology negative H. pylori Colonoscopy excellent prep 1 tubular adenoma polyp 4 mm transverse colon, internal hemorrhoids, no recall secondary to age.  07/14/2021 hemoglobin 10.1, MCV 83.1, normal platelets normal white blood cell count.  Normal thyroid, normal liver, sodium 131, GFR 47, calcium consistently elevated 10.9. B12 342.  She states she has new dentures about 3 weeks ago, prior to that she was not chewing her foods well. This has improved some.  She has difficult swallowing, intermittent getting her new dentures.  Occ she can burp and food will regurg.  She is on inhalers but denies odynophagia or white plaques on tongue, states she is on nystatin wash BID after inhalers.  She has GERD sometimes, daughter states she burps a lot.  She is on protonix once daily and states this does help.  Denies nausea, vomiting, melena, has dark stool from iron pills.  Denies any weight loss.  Has lower AB pain/cramping at times with BM but improved after BM.  She has BM every morning or every other day.   Daughter is with her, has 8 girls and 2 boys.    She  reports that she quit smoking about 49 years ago. Her smoking use included cigarettes. She has a 12.50 pack-year smoking history. She has never used smokeless tobacco. She reports that she does not drink alcohol and does not use drugs.  Current Medications:   Current Outpatient Medications (Endocrine & Metabolic):    predniSONE (DELTASONE) 20 MG tablet, Take 2 tablets (40 mg total) by mouth daily with breakfast.  Current Outpatient Medications (Cardiovascular):    amLODipine-benazepril (LOTREL) 10-40 MG capsule, TAKE 1  CAPSULE BY MOUTH  DAILY   cloNIDine (CATAPRES) 0.1 MG tablet, TAKE 1 TABLET BY MOUTH 2 TIMES DAILY.   furosemide (LASIX) 20 MG tablet, Take 1 tablet (20 mg total) by mouth every morning.   hydrALAZINE (APRESOLINE) 100 MG  tablet, Take 50 mg by mouth 3 (three) times daily.   lovastatin (MEVACOR) 40 MG tablet, TAKE 1 TABLET BY MOUTH AT  BEDTIME   spironolactone (ALDACTONE) 25 MG tablet, Take 25 mg by mouth daily.  Current Outpatient Medications (Respiratory):    albuterol (VENTOLIN HFA) 108 (90 Base) MCG/ACT inhaler, INHALE 2 PUFFS INTO THE LUNGS EVERY 4 HOURS AS NEEDED FOR WHEEZING OR SHORTNESS OF BREATH.   benzonatate (TESSALON PERLES) 100 MG capsule, Take 1 capsule (100 mg total) by mouth 3 (three) times daily as needed for cough.   budesonide-formoterol (SYMBICORT) 160-4.5 MCG/ACT inhaler, Inhale 2 puffs into the lungs 2 (two) times daily.   Fexofenadine HCl (MUCINEX ALLERGY PO), Take by mouth as needed.  Current Outpatient Medications (Analgesics):    acetaminophen (TYLENOL) 325 MG tablet, Take 2 tablets (650 mg total) by mouth every 6 (six) hours as needed for mild pain (or Fever >/= 101).   traMADol (ULTRAM) 50 MG tablet, Take 1-2 tablets (50-100 mg total) by mouth every 6 (six) hours as needed. for pain   Current Outpatient Medications (Other):    ALPRAZolam (XANAX) 0.25 MG tablet, Take 1 tablet (0.25 mg total) by mouth 2 (two) times daily as needed.   gabapentin (NEURONTIN) 300 MG capsule, TAKE 1 CAPSULE BY MOUTH 3 TIMES  DAILY   meclizine (ANTIVERT) 12.5 MG tablet, TAKE 1 TABLET BY MOUTH 3 TIMES A DAY AS NEEDED FOR DIZZINESS   nystatin (MYCOSTATIN) 100000 UNIT/ML suspension, Swish and spit 61m two times daily after using Symbicort.   oxybutynin (DITROPAN) 5 MG tablet, TAKE 1 TABLET BY MOUTH 3  TIMES DAILY   pantoprazole (PROTONIX) 40 MG tablet, TAKE 1 TABLET BY MOUTH  DAILY   Spacer/Aero-Holding Chambers DEVI, Use as directed with inhaler.   sucralfate (CARAFATE) 1 g tablet, Take 1 tablet (1 g total) by mouth 4 (four) times daily -  with meals and at bedtime.   tiZANidine (ZANAFLEX) 4 MG tablet, TAKE 1 TABLET BY MOUTH EVERY 6 HOURS AS NEEDED FOR MUSCLE SPASMS.   traZODone (DESYREL) 50 MG tablet, TAKE  1/2 TO 1 TABLET BY MOUTH AT BEDTIME AS NEEDED FOR SLEEP   triamcinolone cream (KENALOG) 0.1 %, APPLY 1 APPLICATION TOPICALLY TWICE A DAY  Medical History:  Past Medical History:  Diagnosis Date   ABNORMAL CHEST XRAY 11/05/2008   ACUTE URIS OF UNSPECIFIED SITE 10/22/2007   ALLERGIC RHINITIS 12/09/2006   ANEMIA 01/14/2009   Anxiety    Arthritis    ASTHMA 12/09/2006   Asthma    Blood transfusion without reported diagnosis    Cataract    bilateral -removed   CHF (congestive heart failure) ([facility]    Chronic pain syndrome 02/16/2009   COPD 12/09/2006   DEGENERATIVE JOINT DISEASE 12/09/2006   DIZZINESS 03/30/2010   DYSPNEA 01/27/2010   with heavy exertion   FREQUENCY, URINARY 03/30/2010   GERD 12/09/2006   Glossitis 98/14/4818  HELICOBACTER PYLORI GASTRITIS, HX OF 12/09/2006   HIATAL HERNIA 03/01/2010   History of hiatal hernia    HYPERLIPIDEMIA 04/18/2007   HYPERSOMNIA 08/31/2008   HYPERTENSION 12/09/2006   INSOMNIA-SLEEP DISORDER-UNSPEC 11/05/2008   LOW BACK PAIN 12/09/2006   Morbid obesity ([facility] 12/09/2006   Nonspecific (abnormal) findings on radiological and other  examination of body structure 11/05/2008   OSTEOPOROSIS 12/09/2006   OVERACTIVE BLADDER 04/14/2008   PALPITATIONS, RECURRENT 01/27/2010   Stroke ([place])    pt. stated light one years ago   SYNCOPE 11/05/2008   TRANSIENT ISCHEMIC ATTACK, HX OF 12/09/2006   Allergies:  Allergies  Allergen Reactions   Chlorthalidone      Surgical History:  She  has a past surgical history that includes Tubal ligation; Lumbar disc surgery; Colonoscopy; Tonsillectomy; Total hip arthroplasty (Right, 11/06/2012); and Total hip arthroplasty (Left, 02/18/2014). Family History:  Her family history includes Heart disease in her mother; Hypertension in her brother; Lung cancer in her father.  REVIEW OF SYSTEMS  : All other systems reviewed and negative except where noted in the History of Present Illness.  PHYSICAL EXAM: BP (!) 160/60   Pulse (!) 59   Ht 5'  4" (1.626 m)   Wt 202 lb (91.6 kg)   BMI 34.67 kg/m  General:   Pleasant, obese female in no acute distress Head:   Normocephalic and atraumatic. Slight depapillation of left tongue, smooth, slightly erythematous but no obvious oral lesions, no white plaque on pharnyx Eyes:  sclerae anicteric,conjunctive pink  Heart:   regular rate and rhythm Pulm:  Clear anteriorly; no wheezing, diffuse decreased breath sounds, [place] in place on 3 L Abdomen:   Soft, Obese AB, Sluggish bowel sounds. No tenderness . , No organomegaly appreciated. Rectal: Not evaluated Extremities:  With edema. Msk: Symmetrical without gross deformities.Walks with walker.  Neurologic:  Alert and  oriented x4;  No focal deficits.  Skin:   Dry and intact without significant lesions or rashes. Psychiatric:  Cooperative. Normal mood and affect.    Vladimir Crofts, PA-C 11:30 AM

## 2022-02-01 ENCOUNTER — Encounter: Payer: Self-pay | Admitting: Physician Assistant

## 2022-02-01 ENCOUNTER — Ambulatory Visit: Payer: Medicare Other | Admitting: Physician Assistant

## 2022-02-01 VITALS — BP 160/60 | HR 59 | Ht 64.0 in | Wt 202.0 lb

## 2022-02-01 DIAGNOSIS — K5904 Chronic idiopathic constipation: Secondary | ICD-10-CM | POA: Diagnosis not present

## 2022-02-01 DIAGNOSIS — R1310 Dysphagia, unspecified: Secondary | ICD-10-CM

## 2022-02-01 DIAGNOSIS — K2100 Gastro-esophageal reflux disease with esophagitis, without bleeding: Secondary | ICD-10-CM | POA: Diagnosis not present

## 2022-02-01 DIAGNOSIS — B370 Candidal stomatitis: Secondary | ICD-10-CM | POA: Diagnosis not present

## 2022-02-01 DIAGNOSIS — J9611 Chronic respiratory failure with hypoxia: Secondary | ICD-10-CM

## 2022-02-01 NOTE — Patient Instructions (Addendum)
You have been scheduled for a Barium Esophogram at [facility] (1st floor of the hospital) on 02-09-2022 at 930am. Please arrive 30 minutes prior to your appointment for registration. Make certain not to have anything to eat or drink 3 hours prior to your test. If you need to reschedule for any reason, please contact radiology at [phone] to do so. __________________________________________________________________ A barium swallow is an examination that concentrates on views of the esophagus. This tends to be a double contrast exam (barium and two liquids which, when combined, create a gas to distend the wall of the oesophagus) or single contrast (non-ionic iodine based). The study is usually tailored to your symptoms so a good history is essential. Attention is paid during the study to the form, structure and configuration of the esophagus, looking for functional disorders (such as aspiration, dysphagia, achalasia, motility and reflux) EXAMINATION You may be asked to change into a gown, depending on the type of swallow being performed. A radiologist and radiographer will perform the procedure. The radiologist will advise you of the type of contrast selected for your procedure and direct you during the exam. You will be asked to stand, sit or lie in several different positions and to hold a small amount of fluid in your mouth before being asked to swallow while the imaging is performed .In some instances you may be asked to swallow barium coated marshmallows to assess the motility of a solid food bolus. The exam can be recorded as a digital or video fluoroscopy procedure. POST PROCEDURE It will take 1-2 days for the barium to pass through your system. To facilitate this, it is important, unless otherwise directed, to increase your fluids for the next 24-48hrs and to resume your normal diet.  This test typically takes about 30 minutes to  perform. __________________________________________________________________________________   Behavioral and Dietary Strategies for Management of Esophageal Dysmotility/dysphagia 1. Take reflux medications 30+ minutes before food in the morning.  2. Begin meals with warm beverage 3. Eat smaller more frequent meals 4. Eat slowly, taking small bites and sips 5. Alternate solids and liquids 6. Avoid foods/liquids that increase acid production 7. Sit upright during and for 30+ minutes after meals to facilitate esophageal clearing   Can add on B complex or B12   Miralax is an osmotic laxative.  It only brings more water into the stool.  This is safe to take daily.  Can take up to 17 gram of miralax twice a day.  Mix with juice or coffee.  Start 1 capful at night for 3-4 days and reassess your response in 3-4 days.  You can increase and decrease the dose based on your response.  Remember, it can take up to 3-4 days to take effect OR for the effects to wear off.   I often pair this with benefiber in the morning to help assure the stool is not too loose.   Toileting tips to help with your constipation - Drink at least 64-80 ounces of water/liquid per day. - Establish a time to try to move your bowels every day.  For many people, this is after a cup of coffee or after a meal such as breakfast. - Sit all of the way back on the toilet keeping your back fairly straight and while sitting up, try to rest the tops of your forearms on your upper thighs.   - Raising your feet with a step stool/squatty potty can be helpful to improve the angle that allows your stool  to pass through the rectum. - Relax the rectum feeling it bulge toward the toilet water.  If you feel your rectum raising toward your body, you are contracting rather than relaxing. - Breathe in and slowly exhale. "Belly breath" by expanding your belly towards your belly button. Keep belly expanded as you gently direct pressure down and  back to the anus.  A low pitched GRRR sound can assist with increasing intra-abdominal pressure.  - Repeat 3-4 times. If unsuccessful, contract the pelvic floor to restore normal tone and get off the toilet.  Avoid excessive straining. - To reduce excessive wiping by teaching your anus to normally contract, place hands on outer aspect of knees and resist knee movement outward.  Hold 5-10 second then place hands just inside of knees and resist inward movement of knees.  Hold 5 seconds.  Repeat a few times each way.

## 2022-02-02 NOTE — Progress Notes (Signed)
Attending Physician's Attestation   I have reviewed the chart.   I agree with the Advanced Practitioner's note, impression, and recommendations with any updates as below.    Clarabelle Oscarson Mansouraty, MD [facility]oscopy Office # [phone]  

## 2022-02-09 ENCOUNTER — Ambulatory Visit (HOSPITAL_COMMUNITY)
Admission: RE | Admit: 2022-02-09 | Discharge: 2022-02-09 | Disposition: A | Discharge status: Home / Self Care | Payer: Medicare Other | Source: Ambulatory Visit | Attending: Physician Assistant | Admitting: Physician Assistant

## 2022-02-09 DIAGNOSIS — R1310 Dysphagia, unspecified: Secondary | ICD-10-CM | POA: Insufficient documentation

## 2022-02-09 DIAGNOSIS — K224 Dyskinesia of esophagus: Secondary | ICD-10-CM | POA: Diagnosis not present

## 2022-02-20 ENCOUNTER — Telehealth: Payer: Self-pay | Admitting: Internal Medicine

## 2022-02-20 ENCOUNTER — Other Ambulatory Visit: Payer: Self-pay | Admitting: Allergy and Immunology

## 2022-02-20 NOTE — Telephone Encounter (Signed)
LVM for pt to rtn my call to schedule AWV with [facility] call back # [phone] 

## 2022-02-23 DIAGNOSIS — N183 Chronic kidney disease, stage 3 unspecified: Secondary | ICD-10-CM | POA: Diagnosis not present

## 2022-03-08 ENCOUNTER — Ambulatory Visit (INDEPENDENT_AMBULATORY_CARE_PROVIDER_SITE_OTHER): Payer: Medicare Other | Admitting: Pulmonary Disease

## 2022-03-08 ENCOUNTER — Encounter: Payer: Self-pay | Admitting: Pulmonary Disease

## 2022-03-08 VITALS — BP 130/60 | HR 78 | Temp 98.2°F | Ht 64.0 in | Wt 207.0 lb

## 2022-03-08 DIAGNOSIS — J4489 Other specified chronic obstructive pulmonary disease: Secondary | ICD-10-CM

## 2022-03-08 DIAGNOSIS — J439 Emphysema, unspecified: Secondary | ICD-10-CM | POA: Diagnosis not present

## 2022-03-08 MED ORDER — TRELEGY ELLIPTA 200-62.5-25 MCG/ACT IN AEPB: 1.0000 | INHALATION_SPRAY | Freq: Every day | RESPIRATORY_TRACT | 0 refills | Status: DC

## 2022-03-08 MED ORDER — TRELEGY ELLIPTA 200-62.5-25 MCG/ACT IN AEPB: 1.0000 | INHALATION_SPRAY | Freq: Every day | RESPIRATORY_TRACT | 2 refills | Status: DC

## 2022-03-08 NOTE — Patient Instructions (Signed)
I am glad you are stable with your breathing.  We will start you on an inhaler called Trelegy instead of the Symbicort Continue supplemental oxygen Follow-up in 6 months

## 2022-03-08 NOTE — Progress Notes (Signed)
Wendy Torres    [id number]    06/08/1940  Primary Care Physician:John, Hunt Oris, MD  Referring Physician: Biagio Borg, MD [address] [place],  [place] [address]  Chief complaint: Follow-up for COPD  HPI: 81 y.o.  with history of emphysema, hypertension, diastolic heart failure.  Referred here for evaluation of emphysema Complaints of dyspnea with activity, no symptoms at rest.  Denies any cough, sputum production, wheezing She was hospitalized in April 2018 for COPD exacerbation, CHF exacerbation and has been on supplemental oxygen since then Symbicort changed to [facility] in 2019 and she is doing well with this change.  Then she got back to Symbicort due to insurance reasons  Pets: No pets  Occupation: Homemaker Exposures: No known exposures.  Reports a mildew problem in the basement but she hardly goes there Smoking history: 25-pack-year smoker.  Quit in 1982 Travel history: No significant travel Relevant family history: Father had emphysema  Interim history: Continues on Symbicort inhaler.  States that breathing is stable.  Continues on supplemental oxygen Had an echocardiogram which showed pulm rehab in June.  Follow-up sleep study did not reveal sleep apnea.  Outpatient Encounter Medications as of 03/08/2022  Medication Sig   acetaminophen (TYLENOL) 325 MG tablet Take 2 tablets (650 mg total) by mouth every 6 (six) hours as needed for mild pain (or Fever >/= 101).   albuterol (VENTOLIN HFA) 108 (90 Base) MCG/ACT inhaler INHALE 2 PUFFS INTO THE LUNGS EVERY 4 HOURS AS NEEDED FOR WHEEZING OR SHORTNESS OF BREATH.   ALPRAZolam (XANAX) 0.25 MG tablet Take 1 tablet (0.25 mg total) by mouth 2 (two) times daily as needed.   amLODipine-benazepril (LOTREL) 10-40 MG capsule TAKE 1 CAPSULE BY MOUTH  DAILY   benzonatate (TESSALON PERLES) 100 MG capsule Take 1 capsule (100 mg total) by mouth 3 (three) times daily as needed for cough.   cloNIDine (CATAPRES) 0.1 MG tablet TAKE  1 TABLET BY MOUTH 2 TIMES DAILY.   Fexofenadine HCl (MUCINEX ALLERGY PO) Take by mouth as needed.   furosemide (LASIX) 20 MG tablet Take 1 tablet (20 mg total) by mouth every morning.   gabapentin (NEURONTIN) 300 MG capsule TAKE 1 CAPSULE BY MOUTH 3 TIMES  DAILY   hydrALAZINE (APRESOLINE) 100 MG tablet Take 50 mg by mouth 3 (three) times daily.   lovastatin (MEVACOR) 40 MG tablet TAKE 1 TABLET BY MOUTH AT  BEDTIME   meclizine (ANTIVERT) 12.5 MG tablet TAKE 1 TABLET BY MOUTH 3 TIMES A DAY AS NEEDED FOR DIZZINESS   nystatin (MYCOSTATIN) 100000 UNIT/ML suspension Swish and spit 29m two times daily after using Symbicort.   oxybutynin (DITROPAN) 5 MG tablet TAKE 1 TABLET BY MOUTH 3  TIMES DAILY   pantoprazole (PROTONIX) 40 MG tablet TAKE 1 TABLET BY MOUTH  DAILY   predniSONE (DELTASONE) 20 MG tablet Take 2 tablets (40 mg total) by mouth daily with breakfast.   Spacer/Aero-Holding CDorise BullionUse as directed with inhaler.   spironolactone (ALDACTONE) 25 MG tablet Take 25 mg by mouth daily.   sucralfate (CARAFATE) 1 g tablet Take 1 tablet (1 g total) by mouth 4 (four) times daily -  with meals and at bedtime.   SYMBICORT 160-4.5 MCG/ACT inhaler INHALE 2 PUFFS INTO THE LUNGS TWICE A DAY   tiZANidine (ZANAFLEX) 4 MG tablet TAKE 1 TABLET BY MOUTH EVERY 6 HOURS AS NEEDED FOR MUSCLE SPASMS.   traMADol (ULTRAM) 50 MG tablet Take 1-2 tablets (  50-100 mg total) by mouth every 6 (six) hours as needed. for pain   traZODone (DESYREL) 50 MG tablet TAKE 1/2 TO 1 TABLET BY MOUTH AT BEDTIME AS NEEDED FOR SLEEP   triamcinolone cream (KENALOG) 0.1 % APPLY 1 APPLICATION TOPICALLY TWICE A DAY   No facility-administered encounter medications on file as of 03/08/2022.   Physical Exam: Blood pressure 130/60, pulse 78, temperature 98.2 F (36.8 C), temperature source Oral, height '5\' 4"'$  (1.626 m), weight 207 lb (93.9 kg), SpO2 92 %. Gen:      No acute distress HEENT:  EOMI, sclera anicteric Neck:     No masses; no  thyromegaly Lungs:    Clear to auscultation bilaterally; normal respiratory effort CV:         Regular rate and rhythm; no murmurs Abd:      + bowel sounds; soft, non-tender; no palpable masses, no distension Ext:    No edema; adequate peripheral perfusion Skin:      Warm and dry; no rash Neuro: alert and oriented x 3 Psych: normal mood and affect   Data Reviewed: Imaging CT chest 10/17/2008- centrilobular emphysema in the upper lobes, 2 mm nodule in the right lung.  Chest x-ray 08/25/2017- no acute cardiopulmonary abnormality I have reviewed the images personally  PFTs  12/21/2017 FVC 1.75 [9%), FEV1 1.10 [73%], F/F 63, TLC 94%, DLCO 41% Moderate obstruction, severe diffusion defect.  6-minute walk test 02/04/2018 96 m, Patient had to stop for desats.  Titrated to 3 L oxygen  Labs CBC 02/04/2018-WBC 7.4, eos 2.5%, absolute eosinophil count 185 Alpha-1 antitrypsin 12/05/2017-159, PI MM IgE 12/05/2017-224  Cardiac: Echocardiogram 01/24/2021 LVEF 60 to 65%, mild concentric LVH Severely elevated PA systolic pressure, RVSP 66  Sleep: Sleep study 04/28/2021 No OSA, AHI 0.  Noted oxygen desaturation.  Assessment:  COPD CT scan from 2010 reviewed which shows emphysematous changes in the upper lobes.   PFTs reviewed which show moderate obstruction with no significant bronchodilator response.  Currently on advair.  I will switch her to Trelegy she continues to have dyspnea on exertion  Continue supplemental oxygen with exercise and at night as she has pulmonary hypertension and desats on exertion on prior tests though sleep study did not show low oxygen levels.  Pulmonary hypertension Likely secondary to group 3 disease with COPD.  No evidence of OSA on sleep study.  Health maintenance 12/03/2013-Prevnar 12/08/2018-Pneumovax  Plan/Recommendations: - Change inhaler to Trelegy -Continue supplemental oxygen  Follow-up in 6 months  From follow-up in 1 year  Marshell Garfinkel  MD [facility] 03/08/2022, 3:55 PM  CC: Biagio Borg, MD

## 2022-03-15 ENCOUNTER — Other Ambulatory Visit: Payer: Self-pay

## 2022-03-15 NOTE — Patient Outreach (Signed)
Aging Gracefully Program  03/15/2022  ALAYCIA EARDLEY [birth year] [id number]   [facility] Evaluation Interviewer made contact with patient. Aging Gracefully survey completed.   Interviewer will send referral to RN and OT for follow up.   [place] Management Assistant [phone]

## 2022-03-29 DIAGNOSIS — D631 Anemia in chronic kidney disease: Secondary | ICD-10-CM | POA: Diagnosis not present

## 2022-03-29 DIAGNOSIS — N183 Chronic kidney disease, stage 3 unspecified: Secondary | ICD-10-CM | POA: Diagnosis not present

## 2022-03-29 DIAGNOSIS — I129 Hypertensive chronic kidney disease with stage 1 through stage 4 chronic kidney disease, or unspecified chronic kidney disease: Secondary | ICD-10-CM | POA: Diagnosis not present

## 2022-03-29 DIAGNOSIS — N2581 Secondary hyperparathyroidism of renal origin: Secondary | ICD-10-CM | POA: Diagnosis not present

## 2022-03-29 DIAGNOSIS — E871 Hypo-osmolality and hyponatremia: Secondary | ICD-10-CM | POA: Diagnosis not present

## 2022-03-30 ENCOUNTER — Other Ambulatory Visit: Payer: Self-pay | Admitting: Internal Medicine

## 2022-03-31 ENCOUNTER — Other Ambulatory Visit: Payer: Self-pay | Admitting: Occupational Therapy

## 2022-04-02 NOTE — Patient Instructions (Signed)
Goals Addressed             This Visit's Progress    Patient Stated       She would like to be able to access her shower and feel safe getting in/out of bathroom and on/off toilet. Tub to shower conversion with grab bars, hand held shower and lower holder), shower seat, toilet hands that do not secure via floor (current tripping hazzard due to small bathroom, especially with rollator), perhaps widen bathroom door if able.     Patient Stated       Safety getting in and out of her house. Permanent ramp with door threshold made show no hump to go over with rollator or wheelchair.     Patient Stated       Would like to be able to put on and take off compression socks easier (look into strategies for donning, dressing stick for doffing.     Patient Stated       She would like to be able to get out of her house more--transport chair

## 2022-04-02 NOTE — Patient Outreach (Signed)
Aging Gracefully Program  OT Initial Visit  04/02/2022  Wendy Torres 1941-01-22 [id number]  Visit:  1- Initial Visit  Start Time:  1500 End Time:  4268 Total Minutes:  3419  CCAP: Typical Daily Routine: Typical Daily Routine:: Gets up, washes up with setup by dtr, gets dressed while dtr fixes her breakfast. Stays around house most of day unless has appointment to go to What Types Of Care Problems Are You Having Throughout The Day?: Can't access shower, chronic back and knee pain that makes her pace of completing tasks slower or unable at times What Kind Of Help Do You Receive?: family does some of household tasks, dtr setups up bathing water for her for sponge bath What Do You Think Would Make Everyday Life Easier For You?: Accessible shower, ramp at front door What Is A Good Day Like?: Less pain What Is A Bad Day Like?: More pain Do You Have Time For Yourself?: yes Patient Reported Equipment: Patient Reported Equipment Currently Used: Rollator (rails (type of versa frame) for toilet) Other Equipment:: oxygen Functional Mobility-Walk A Block: Walk A Block: Unable To Do Do You:: Use A Device Importance Of Learning New Strategies:: Moderate Safety: Moderate/Extreme Risk Efficiency: Not At All Intervention: Yes Other Comments:: transport chair Functional Mobility-Maintain Balance While Showering: Maintaining Balance While Showering: A Little Difficulty Do You:: Use Both A Device And Personal Assistance Importance Of Learning New Strategies:: Very Much Safety: Moderate/Extreme Risk Efficiency: Somewhat Intervention: Yes Other Comments:: Walk in shower with grab bars and handheld shower Functional Mobility-Move In And Out Of Bath/Shower: Move In And Out Of A Bath/Shower: Unable To Do Do You::  (dont get in tub) Importance Of Learning New Strategies:: Very Much Safety: Extreme Risk Efficiency: Not At All Intervention: Yes Other Comments:: tub to shower  conversion Activities of Daily Living-Put On And Take Off Socks And Shoes: Put On And Take Off Socks And  Shoes: Moderate Difficulty Do You:: No Device/No Assistance Importance Of Learning New Strategies: Moderate Safety: A Little Risk Efficiency: Somewhat Intervention: Yes Other Comments:: look into methods to donning compression socks Instrumental Activities of Daily Living-Grocery Shopping: Do Grocery Shopping: Unable To Do Do You:: No Device/No Assistance Importance Of Learning New Strategies: Moderate Intervention: Yes Other Comments:: transport chair  Readiness To Change Score:  Readiness to Change Score: 9.33  Home Environment Assessment: Outside Home Entry:: Currently there is a temporary aluminum ramp at her front door with plans for a permanent ramp on 04/08/2022 per client. She has a small threshold piece at front door that is a hinderance for a wheelchair and RW/rollator Kitchen:: Kitchen cabinets are sagging/pulling away from walls Bathroom:: She has a tutb shower combination with shower curtain, a comfort height toilet with toilet handles from a frame that sits on the floor (tripping hazzard) and bathroom door is too small to get her rollator in frontwards (she has to go sideways) Other Home Environment Concerns:: Some of the outlets are not working in house, door between kitchen and where her son lives does not lock, back door needs replacing  Goals:  Goals Addressed             This Visit's Progress    Patient Stated       She would like to be able to access her shower and feel safe getting in/out of bathroom and on/off toilet. Tub to shower conversion with grab bars, hand held shower and lower holder), shower seat, toilet hands that do not secure via floor (current  tripping hazzard due to small bathroom, especially with rollator), perhaps widen bathroom door if able.     Patient Stated       Safety getting in and out of her house. Permanent ramp with door threshold  made show no hump to go over with rollator or wheelchair.     Patient Stated       Would like to be able to put on and take off compression socks easier (look into strategies for donning, dressing stick for doffing.     Patient Stated       She would like to be able to get out of her house more--transport chair        Post Clinical Reasoning: Clinician View Of Client Situation:: Ms. Areola does what she can around the house with limitations due to chronic back and bilateral knee pain. She has had bilateral hip replacements. She moblizies with a rollator and is on oxygen. She likes to get out and about but is limited due to her pain. Family is with her almost 24/7 and helps with the tasks around the house she cannot do. Client View Of His/Her Situation:: She would like to get out more but due to pain she cannot ambulate for very long. "I do what I can, taking my time". She reports she can do her own basic ADLs with increased time most of the time with occassional assist and setup. Next Visit Plan:: Compression socks or transport chair  Golden Circle, OTR/L [facility] [phone] Office [phone]

## 2022-04-03 ENCOUNTER — Encounter: Payer: Self-pay | Admitting: Pulmonary Disease

## 2022-04-06 ENCOUNTER — Other Ambulatory Visit (HOSPITAL_COMMUNITY): Payer: Self-pay

## 2022-04-06 ENCOUNTER — Telehealth: Payer: Self-pay | Admitting: *Deleted

## 2022-04-06 MED ORDER — TRELEGY ELLIPTA 200-62.5-25 MCG/ACT IN AEPB: 1.0000 | INHALATION_SPRAY | Freq: Every day | RESPIRATORY_TRACT | 0 refills | Status: DC

## 2022-04-06 NOTE — Telephone Encounter (Signed)
Two samples of Trelegy 200 placed up front for patient/daughter to pick up.

## 2022-04-06 NOTE — Telephone Encounter (Signed)
Pharmacy team, Can you please run a test claim to see what inhalers are covered by the patient's insurance?  She says that the Trelegy 200 is too expensive and we need an alternative.  I have reached out to the patient to see if she is in the coverage gap.  Thank you.

## 2022-04-11 ENCOUNTER — Other Ambulatory Visit (HOSPITAL_COMMUNITY): Payer: Self-pay

## 2022-04-11 NOTE — Telephone Encounter (Signed)
Patient is currently in the Coverage Gap phase of insurance plan. This is contributing to cost.

## 2022-04-12 ENCOUNTER — Other Ambulatory Visit: Payer: Self-pay | Admitting: Internal Medicine

## 2022-04-12 NOTE — Telephone Encounter (Signed)
Please refill as per office routine med refill policy (all routine meds to be refilled for 3 mo or monthly (per pt preference) up to one year from last visit, then month to month grace period for 3 mo, then further med refills will have to be denied) ? ?

## 2022-04-21 DIAGNOSIS — N183 Chronic kidney disease, stage 3 unspecified: Secondary | ICD-10-CM | POA: Diagnosis not present

## 2022-04-28 ENCOUNTER — Encounter: Payer: Self-pay | Admitting: Internal Medicine

## 2022-04-28 MED ORDER — GABAPENTIN 300 MG PO CAPS: ORAL_CAPSULE | ORAL | 1 refills | Status: DC

## 2022-04-28 NOTE — Telephone Encounter (Signed)
Ok this was sent to [employer]

## 2022-04-28 NOTE — Telephone Encounter (Signed)
See below

## 2022-05-01 ENCOUNTER — Other Ambulatory Visit: Payer: Self-pay | Admitting: Internal Medicine

## 2022-05-04 ENCOUNTER — Telehealth: Payer: Self-pay

## 2022-05-04 NOTE — Patient Outreach (Signed)
Aging Gracefully Program  05/04/2022  Wendy Torres [birth year] [id number]  Placed call to patient to offer initial RN home visit for the Aging Gracefully program. No answer. Left a message requesting call back.  Tomasa Rand RN, BSN, Careers information officer for [employer] Mobile: [phone]

## 2022-05-05 NOTE — Telephone Encounter (Signed)
PCCs, is it correct that the pt would not be able to have a POC through [employer] as she is not a new O2 start?

## 2022-05-05 NOTE — Telephone Encounter (Signed)
Community message has been sent to [employer] to see if pt would be eligible for a POC.

## 2022-05-11 ENCOUNTER — Other Ambulatory Visit: Payer: Self-pay | Admitting: Occupational Therapy

## 2022-05-11 NOTE — Patient Outreach (Signed)
Aging Gracefully Program  OT Follow-Up Visit  05/11/2022  Wendy Torres [birth year] [id number]  Visit:  2- Second Visit  Start Time:  1600 End Time:  1645 Total Minutes:  45  Readiness to Change Score :  Readiness to Change Score: 10  Durable Medical Equipment: Durable Medical Equipment: Other (transport chair) Durable Medical Equipment Distribution Date: 05/11/22  Patient Education: Education Provided: Yes Education Details: Use and different aspects of transport chair (client and dtr Wendy Torres who also videoed by demonstration) Person(s) Educated: Patient, Child(ren) Comprehension: Verbalized Understanding  Goals:   Goals Addressed             This Visit's Progress    COMPLETED: Patient Stated       She would like to be able to get out of her house more--transport chair--MET, transport chair provided to client.  Name: Wendy Torres               Date: 05/11/2022  OT Action Plan: Generic  Target Problem Area:   Ease and safety getting in and out of house as well as getting around in the community  Why Problem May Occur:     Decreased mobility   Normal wheelchair too heavy for transporting when going out and about  Target Goal(s):   Ease and safety with getting out and about with family and some places in home      STRATEGIES   Saving your Energy DO:   Let others push you around in transport chair when you will be going out and about for longer distances    Change your home to make it safe for you DO:  [employer] will make front door transition piece safer for getting in and out of the house in the transport chair, rollator.     PRACTICE   Based on what we have talked about, you are willing to try: Use transport chair when it will make life easier in and out of your home.    If an idea does not work the first time, try it again.  We may make some changes over the next few sessions, based on how they work.   Golden Circle,  OTR/L      05/11/2022 Occupational Therapist      Date        Post Clinical Reasoning: Client Action (Goal) Four Interventions: Client provided with transport chair and I went over with her and her daughter, Wendy Torres how to adjust and use the different features of it. Did Client Try?: Yes Targeted Problem Area Status: A Lot Better Clinician View Of Client Situation:: Ms. Starkel still tires easily with small amounts of activity. She is able to transfer from surface to surface without AD but not household or community distances. She is able to manage her own O2 tubing when up and about. We did conclude that the transport chair will not go in the bathoom door and even door off it would be a very tight "squeeze" and she still has the small threshold step to go over into the bathroom that she could not manuver on her on in the transport chair or even safely with A in the transport chair--could be tipped forward out of chair easily (does have a seat belt in transport chair, which would keep her from falling out but still it could tip forward. Client View Of His/Her Situation:: Ms. Meuth was thankful to get the transport chair and looks forward to using it when going out to  an appointment tomorow morning. Next Visit Plan:: Compression sock donner and doffer  Golden Circle, OTR/L [facility] [employer] [facility] [phone] Office [phone]

## 2022-05-11 NOTE — Patient Instructions (Signed)
Name: Wendy Torres               Date: 05/11/2022  OT Action Plan: Generic  Target Problem Area:   Ease and safety getting in and out of house as well as getting around in the community  Why Problem May Occur:     Decreased mobility   Normal wheelchair too heavy for transporting when going out and about  Target Goal(s):   Ease and safety with getting out and about with family and some places in home      STRATEGIES   Saving your Energy DO:   Let others push you around in transport chair when you will be going out and about for longer distances    Change your home to make it safe for you DO:  [employer] will make front door transition piece safer for getting in and out of the house in the transport chair, rollator.     PRACTICE   Based on what we have talked about, you are willing to try: Use transport chair when it will make life easier in and out of your home.    If an idea does not work the first time, try it again.  We may make some changes over the next few sessions, based on how they work.   Golden Circle, OTR/L      05/11/2022 Occupational Therapist      Date

## 2022-05-12 ENCOUNTER — Telehealth: Payer: Self-pay

## 2022-05-12 ENCOUNTER — Other Ambulatory Visit: Payer: Self-pay | Admitting: Internal Medicine

## 2022-05-12 DIAGNOSIS — H5203 Hypermetropia, bilateral: Secondary | ICD-10-CM | POA: Diagnosis not present

## 2022-05-12 DIAGNOSIS — H35373 Puckering of macula, bilateral: Secondary | ICD-10-CM | POA: Diagnosis not present

## 2022-05-12 DIAGNOSIS — H524 Presbyopia: Secondary | ICD-10-CM | POA: Diagnosis not present

## 2022-05-12 DIAGNOSIS — H353131 Nonexudative age-related macular degeneration, bilateral, early dry stage: Secondary | ICD-10-CM | POA: Diagnosis not present

## 2022-05-12 DIAGNOSIS — H16223 Keratoconjunctivitis sicca, not specified as Sjogren's, bilateral: Secondary | ICD-10-CM | POA: Diagnosis not present

## 2022-05-12 DIAGNOSIS — H52223 Regular astigmatism, bilateral: Secondary | ICD-10-CM | POA: Diagnosis not present

## 2022-05-12 DIAGNOSIS — H43813 Vitreous degeneration, bilateral: Secondary | ICD-10-CM | POA: Diagnosis not present

## 2022-05-12 NOTE — Telephone Encounter (Signed)
Please refill as per office routine med refill policy (all routine meds to be refilled for 3 mo or monthly (per pt preference) up to one year from last visit, then month to month grace period for 3 mo, then further med refills will have to be denied)

## 2022-05-12 NOTE — Patient Outreach (Signed)
Aging Gracefully Program  05/12/2022  Wendy Torres 1940-11-04 [id number]   Placed call to patient to attempt to schedule home visit. Left message at patients contact and left message with daughter [name].  Plan: Awaiting a call back.  Tomasa Rand RN, BSN, Careers information officer for [employer] Mobile: [phone]

## 2022-05-12 NOTE — Patient Outreach (Signed)
Aging Gracefully Program  05/12/2022  Wendy Torres 1941/02/08 [id number]   Incoming call from daughter. Reviewed purpose of phone call.   PLAN: Home visit planned for 04/19/2023  at 1030 am.  Confirmed address  Wendy Rand RN, BSN, CEN RN Case Freight forwarder for [employer] Mobile: [phone]

## 2022-05-19 ENCOUNTER — Other Ambulatory Visit: Payer: Self-pay

## 2022-05-19 NOTE — Patient Instructions (Signed)
Visit Information  Thank you for taking time to visit with me today. Please don't hesitate to contact me if I can be of assistance to you before our next scheduled telephone appointment.  Following are the goals we discussed today:   Goals Addressed               This Visit's Progress     Wendy Gracefully RN (pt-stated)        Goal: Patient would like to have decreased pain in her back and knees in the next 120 days.   05/19/2022  Assessment:  Patient reports pain in her lower back and knees. Reports she took a tramadol this am for pain. Reports she takes a tramadol daily for pain.  No current pain when she is sitting.  Reports pain with activity like walking with her walker in the home.  Appears steady on her feet.    Interventions: provided [facility] calendar. Provided my contact information   Brainstorming to review ways to help with pain.  Plan: Next home visit planned for 06/09/2022  CLIENT/RN ACTION PLAN - PAIN  Registered Nurse:  Tomasa Rand   Date:  05/19/2022  Client Name:Wendy Torres Client ID:    Target Area:  PAIN   Why Problem May Occur:  arthirits  Target Goal: Patient will report decrease pain in lower back and knees in the next 120 days.  STRATEGIES Coping Strategies Ideas:  Heat  05/19/2022  reviewed Use heating pad or warm towel on painful area no more than 20 minutes at a time. Don't sleep with a heating pad on - it could burn your skin   Ice  05/19/2022  reviewed Use ice pack or frozen bag of vegetables on painful area.   Leave cold pack on for less than 20 minutes. Ice can burn your skin.  Don't leave ice on longer than 20 minutes.   Activity and Exercise  Next home visit Joints get stiff when not in use Wendy Gracefully Exercises Walking (inside or outside) [employer]:  cooking, cleaning, Psychologist, forensic   Listen to Music Listening to music can decrease pain. Turn off the TV and turn on the radio.   Prayer/Meditation  Prayer and meditation can decrease pain   Other   Acetaminophen/Tylenol (same medication  05/19/2022  Reviewed medication list  DO NOT take more Acetaminophen than below because it can be bad for your liver. 500 mg. tablets:  2 tablets every 8 hours, as needed for pain.  Do not take more than 6, (500 mg) tablets every day. 300 mg. tablets:  2 tablets every 6 hours, as needed for pain.  Do not take more than 8 (325 mg) tablets every day. Look for Acetaminophen/Tylenol in other medicines you buy over the counter.   Still in Pain There ae a lot of different kinds of pain medicines:  creams, patches and supplements. Ask your Healthcare Provider about other pain medications.   Stop Smoking  05/19/2022  N/A Smoking can make arthritis worse.   Stop Pain Before it gets bad. Once in pain It is harder to get rid of. Begin pain relief while you have mild pain.   Other   Other   ;  PRACTICE It is important to practice the strategies so we can determine if they will be effective in helping to reach the goal.    Follow these specific recommendations:  Take medications as prescribed.    Be as active as possible and continue your chair  exercises.   If strategy does not work the first time, try it again.  We may make some changes over the next few sessions.    We may make some changes over the next few sessions, based on how they work.   Tomasa Rand RN, BSN, CEN RN Case Freight forwarder for [employer] Mobile: [phone]            Our next appointment is on 06/09/2022 at 85  If you are experiencing a Mental Health or [place] or need someone to talk to, please call the Suicide and Crisis Lifeline: 988 call the [place] National Suicide Prevention Lifeline: (616) 665-3468 or TTY: 847-078-1985 TTY 770-152-3984) to talk to a trained counselor call 1-800-273-TALK (toll free, 24 hour hotline) go to [facility] [place] [phone]) call 911   The patient verbalized understanding of instructions, educational materials, and care plan provided today and agreed to receive a mailed copy of patient instructions, educational materials, and care plan.   Tomasa Rand RN, BSN, Careers information officer for [employer] Mobile: [phone]

## 2022-05-23 ENCOUNTER — Other Ambulatory Visit: Payer: Self-pay | Admitting: Internal Medicine

## 2022-05-23 ENCOUNTER — Other Ambulatory Visit: Payer: Self-pay

## 2022-05-23 NOTE — Telephone Encounter (Signed)
Please refill as per office routine med refill policy (all routine meds to be refilled for 3 mo or monthly (per pt preference) up to one year from last visit, then month to month grace period for 3 mo, then further med refills will have to be denied)

## 2022-05-24 ENCOUNTER — Other Ambulatory Visit: Payer: Self-pay | Admitting: Internal Medicine

## 2022-06-04 ENCOUNTER — Other Ambulatory Visit: Payer: Self-pay | Admitting: Internal Medicine

## 2022-06-06 ENCOUNTER — Other Ambulatory Visit: Payer: Self-pay | Admitting: Internal Medicine

## 2022-06-07 ENCOUNTER — Other Ambulatory Visit: Payer: Self-pay | Admitting: Internal Medicine

## 2022-06-07 NOTE — Telephone Encounter (Signed)
Please refill as per office routine med refill policy (all routine meds to be refilled for 3 mo or monthly (per pt preference) up to one year from last visit, then month to month grace period for 3 mo, then further med refills will have to be denied) ? ?

## 2022-06-08 NOTE — Telephone Encounter (Signed)
Please refill as per office routine med refill policy (all routine meds to be refilled for 3 mo or monthly (per pt preference) up to one year from last visit, then month to month grace period for 3 mo, then further med refills will have to be denied) ? ?

## 2022-06-09 ENCOUNTER — Other Ambulatory Visit: Payer: Self-pay

## 2022-06-09 NOTE — Patient Outreach (Signed)
Aging Gracefully Program  RN Visit  06/09/2022  [name] 05-07-1940 [id number]  Visit:  RN Visit Number: 2- Second Visit  RN TIME CALCULATION: Start TIme:  RN Start Time Calculation: [id number] End Time:  RN Stop Time Calculation: 1120 Total Minutes:  RN Time Calculation: 50  Readiness To Change Score:     Universal RN Interventions: Calendar Distribution: Yes Exercise Review: Yes Medications: Yes Medication Changes: Yes Mood: Yes Pain: Yes PCP Advocacy/Support: Yes Fall Prevention: Yes Incontinence: Yes Clinician View Of Client Situation: Home neat and clean.  Arrived to find patient sitting in her chair rubbing her knees. Reports severe pain in her knees.  Ambulated slower than last home visit with her walker Client View Of His/Her Situation: Patient reports severe pain to the right knee.  Reports swelling. States she guesses this is arthritis.  Reports taking Tamadol for pain but it does not really help. Patient reports she has good and bad days and today is a bad day. Patient reports that she is using heat and ice without much improvement. States she has had cortisone injections in the past without any relief. Reports she was offered gel injections but that they were 600.00 per knee.  Healthcare Provider Communication: Did Higher education careers adviser With [employer] Provider?: Yes Method Of Communication: Other: Healthcare Provider Response According to RN: secured chat.    Sent message back with suggestion for voltaren gel and follow up with ortho  Clinician View of Client Situation: Clinician View Of Client Situation: Home neat and clean.  Arrived to find patient sitting in her chair rubbing her knees. Reports severe pain in her knees.  Ambulated slower than last home visit with her walker Client's View of His/Her Situation: Client View Of His/Her Situation: Patient reports severe pain to the right knee.  Reports swelling. States she guesses this is arthritis.  Reports taking  Tamadol for pain but it does not really help. Patient reports she has good and bad days and today is a bad day. Patient reports that she is using heat and ice without much improvement. States she has had cortisone injections in the past without any relief. Reports she was offered gel injections but that they were 600.00 per knee.  Medication Assessment denies any changes to medications    OT Update: pending community housing assessment  Session Summary: severe pain today.  Message sent to Md. Reviewed how to take medications.    Goals Addressed               This Visit's Progress     Aging Gracefully RN (pt-stated)        Goal: Patient would like to have decreased pain in her back and knees in the next 120 days.   05/19/2022  Assessment:  Patient reports pain in her lower back and knees. Reports she took a tramadol this am for pain. Reports she takes a tramadol daily for pain.  No current pain when she is sitting.  Reports pain with activity like walking with her walker in the home.  Appears steady on her feet.    Interventions: provided [facility] calendar. Provided my contact information   Brainstorming to review ways to help with pain.  Plan: Next home visit planned for 06/09/2022  CLIENT/RN ACTION PLAN - PAIN  Registered Nurse:  Tomasa Rand   Date:  05/19/2022  Client Torres:[name] Client ID:    Target Area:  PAIN   Why Problem May Occur:  arthirits  Target Goal: Patient will  report decrease pain in lower back and knees in the next 120 days.  STRATEGIES Coping Strategies Ideas:  Heat  05/19/2022  reviewed Use heating pad or warm towel on painful area no more than 20 minutes at a time. Don't sleep with a heating pad on - it could burn your skin   Ice  05/19/2022  reviewed Use ice pack or frozen bag of vegetables on painful area.   Leave cold pack on for less than 20 minutes. Ice can burn your skin.  Don't leave ice on longer than 20 minutes.   Activity and  Exercise  Next home visit Joints get stiff when not in use Aging Gracefully Exercises Walking (inside or outside) [employer]:  cooking, cleaning, Psychologist, forensic   Listen to Music Listening to music can decrease pain. Turn off the TV and turn on the radio.   Prayer/Meditation Prayer and meditation can decrease pain   Other   Acetaminophen/Tylenol (same medication  05/19/2022  Reviewed medication list  DO NOT take more Acetaminophen than below because it can be bad for your liver. 500 mg. tablets:  2 tablets every 8 hours, as needed for pain.  Do not take more than 6, (500 mg) tablets every day. 300 mg. tablets:  2 tablets every 6 hours, as needed for pain.  Do not take more than 8 (325 mg) tablets every day. Look for Acetaminophen/Tylenol in other medicines you buy over the counter.   Still in Pain There ae a lot of different kinds of pain medicines:  creams, patches and supplements. Ask your Healthcare Provider about other pain medications.   Stop Smoking  05/19/2022  N/A Smoking can make arthritis worse.   Stop Pain Before it gets bad. Once in pain It is harder to get rid of. Begin pain relief while you have mild pain.   Other   Other   ;  PRACTICE It is important to practice the strategies so we can determine if they will be effective in helping to reach the goal.    Follow these specific recommendations:  Take medications as prescribed.    Be as active as possible and continue your chair exercises.   If strategy does not work the first time, try it again.  We may make some changes over the next few sessions.    We may make some changes over the next few sessions, based on how they work.   Tomasa Rand RN, BSN, CEN RN Case Freight forwarder for [place] Mobile: [phone]   06/09/2022 Assessment:  Patient reports 10/10 pain to her right knee today.  Reports tramadol not working.  Reports using ice and  heat.  Interventions:  Provided written home exercise plan  and demonstrated how to do exercises. Reviewed concern about knee pain with patient. Reviewed that she can take her tramadol every 6 hours.  Called in refill for patient and she is not able to do this herself. Sent secure message to MD to inform of pain. He suggested patient use OTC Voltaren and follow up with ortho.  I reviewed this with patient and daughter.  Encouraged patient to do her exercises when she can tolerate them.   PLAN: follow up on March 8.    Tomasa Rand RN, BSN, Careers information officer for [employer] Mobile: [phone]        Tomasa Rand RN, BSN, Careers information officer for [employer] Mobile: [phone]

## 2022-06-09 NOTE — Patient Instructions (Signed)
Visit Information   Thank you for taking time to visit with me today. Please don't hesitate to contact me if I can be of assistance to you before our next scheduled home visit.  Following are the goals we discussed today:   Goals       Aging Gracefully RN (pt-stated)      Goal: Patient would like to have decreased pain in her back and knees in the next 120 days.   05/19/2022  Assessment:  Patient reports pain in her lower back and knees. Reports she took a tramadol this am for pain. Reports she takes a tramadol daily for pain.  No current pain when she is sitting.  Reports pain with activity like walking with her walker in the home.  Appears steady on her feet.    Interventions: provided [facility] calendar. Provided my contact information   Brainstorming to review ways to help with pain.  Plan: Next home visit planned for 06/09/2022  CLIENT/RN ACTION PLAN - PAIN  Registered Nurse:  Tomasa Rand   Date:  05/19/2022  Client Name:Wendy Torres Client ID:    Target Area:  PAIN   Why Problem May Occur:  arthirits  Target Goal: Patient will report decrease pain in lower back and knees in the next 120 days.  STRATEGIES Coping Strategies Ideas:  Heat  05/19/2022  reviewed Use heating pad or warm towel on painful area no more than 20 minutes at a time. Don't sleep with a heating pad on - it could burn your skin   Ice  05/19/2022  reviewed Use ice pack or frozen bag of vegetables on painful area.   Leave cold pack on for less than 20 minutes. Ice can burn your skin.  Don't leave ice on longer than 20 minutes.   Activity and Exercise  Next home visit Joints get stiff when not in use Aging Gracefully Exercises Walking (inside or outside) [employer]:  cooking, cleaning, Psychologist, forensic   Listen to Music Listening to music can decrease pain. Turn off the TV and turn on the radio.   Prayer/Meditation Prayer and meditation can decrease pain   Other    Acetaminophen/Tylenol (same medication  05/19/2022  Reviewed medication list  DO NOT take more Acetaminophen than below because it can be bad for your liver. 500 mg. tablets:  2 tablets every 8 hours, as needed for pain.  Do not take more than 6, (500 mg) tablets every day. 300 mg. tablets:  2 tablets every 6 hours, as needed for pain.  Do not take more than 8 (325 mg) tablets every day. Look for Acetaminophen/Tylenol in other medicines you buy over the counter.   Still in Pain There ae a lot of different kinds of pain medicines:  creams, patches and supplements. Ask your Healthcare Provider about other pain medications.   Stop Smoking  05/19/2022  N/A Smoking can make arthritis worse.   Stop Pain Before it gets bad. Once in pain It is harder to get rid of. Begin pain relief while you have mild pain.   Other   Other   ;  PRACTICE It is important to practice the strategies so we can determine if they will be effective in helping to reach the goal.    Follow these specific recommendations:  Take medications as prescribed.    Be as active as possible and continue your chair exercises.   If strategy does not work the first time, try it again.  We may  make some changes over the next few sessions.    We may make some changes over the next few sessions, based on how they work.   Tomasa Rand RN, BSN, CEN RN Case Freight forwarder for [employer]: [phone]   06/09/2022 Assessment:  Patient reports 10/10 pain to her right knee today.  Reports tramadol not working.  Reports using ice and heat.  Interventions:  Provided written home exercise plan  and demonstrated how to do exercises. Reviewed concern about knee pain with patient. Reviewed that she can take her tramadol every 6 hours.  Called in refill for patient and she is not able to do this herself. Sent secure message to MD to inform of pain. He suggested patient use OTC Voltaren and follow up with  ortho.  I reviewed this with patient and daughter.  Encouraged patient to do her exercises when she can tolerate them.   PLAN: follow up on March 8.    Tomasa Rand RN, BSN, CEN RN Case Freight forwarder for [employer] [employer]: [phone]       Patient Stated      No healthcare goals for this year.      Patient Stated      She would like to be able to access her shower and feel safe getting in/out of bathroom and on/off toilet. Tub to shower conversion with grab bars, hand held shower and lower holder), shower seat, toilet hands that do not secure via floor (current tripping hazzard due to small bathroom, especially with rollator), perhaps widen bathroom door if able.      Patient Stated      Safety getting in and out of her house. Permanent ramp with door threshold made show no hump to go over with rollator or wheelchair.      Patient Stated      Would like to be able to put on and take off compression socks easier (look into strategies for donning, dressing stick for doffing.         Our next appointment is 07/07/2022 at 1030   If you are experiencing a Mental Health or [place] or need someone to talk to, please call the Suicide and Crisis Lifeline: 988 call the [place] National Suicide Prevention Lifeline: 343-569-3268 or TTY: 516 549 5123 TTY (267) 793-0682) to talk to a trained counselor call 1-800-273-TALK (toll free, 24 hour hotline) go to [facility] [address], [place] [phone]) call 911    AVS printed and mailed  Tomasa Rand, RN, BSN, CEN [place] Coordinator [phone]

## 2022-06-12 ENCOUNTER — Other Ambulatory Visit: Payer: Self-pay | Admitting: Internal Medicine

## 2022-06-21 ENCOUNTER — Ambulatory Visit: Payer: Medicare Other | Admitting: Internal Medicine

## 2022-06-22 ENCOUNTER — Other Ambulatory Visit: Payer: Self-pay | Admitting: Internal Medicine

## 2022-06-23 ENCOUNTER — Ambulatory Visit (INDEPENDENT_AMBULATORY_CARE_PROVIDER_SITE_OTHER): Payer: Medicare Other

## 2022-06-23 VITALS — Ht 64.0 in

## 2022-06-23 DIAGNOSIS — Z0000 Encounter for general adult medical examination without abnormal findings: Secondary | ICD-10-CM | POA: Diagnosis not present

## 2022-06-23 NOTE — Telephone Encounter (Signed)
Please refill as per office routine med refill policy (all routine meds to be refilled for 3 mo or monthly (per pt preference) up to one year from last visit, then month to month grace period for 3 mo, then further med refills will have to be denied) ? ?

## 2022-06-23 NOTE — Progress Notes (Signed)
I connected with  Wendy Torres and daughter, Wendy Torres on 06/23/2022 at 4:00 p.m. EST by telephone and verified that I am speaking with the correct person using two identifiers.  Location: Patient: Home Provider: [place] Persons participating in the virtual visit: [name]   I discussed the limitations, risks, security and privacy concerns of performing an evaluation and management service by telephone and the availability of in person appointments. The patient expressed understanding and agreed to proceed.  Interactive audio and video telecommunications were attempted between this nurse and patient, however failed, due to patient having technical difficulties OR patient did not have access to video capability.  We continued and completed visit with audio only.  Some vital signs may be absent or patient reported.   Wendy Flow, LPN  Subjective:   Wendy Torres is a 82 y.o. female who presents for Medicare Annual (Subsequent) preventive examination.  Review of Systems     Cardiac Risk Factors include: advanced age (>91mn, >>30women);dyslipidemia;family history of premature cardiovascular disease;hypertension;obesity (BMI >30kg/m2);sedentary lifestyle     Objective:    Today's Vitals   06/23/22 1629  Height: '5\' 4"'$  (1.626 m)  PainSc: 10-Worst pain ever  PainLoc: Back   Body mass index is 35.53 kg/m.     06/23/2022    4:13 PM 06/12/2021    6:57 PM 04/28/2021   11:53 PM 10/15/2020   11:30 AM 08/26/2017    6:00 AM 02/18/2014   11:35 AM 02/10/2014   10:38 AM  Advanced Directives  Does Patient Have a Medical Advance Directive? No No No No No No No  Would patient like information on creating a medical advance directive? No - Patient declined No - Patient declined No - Patient declined Yes (MAU/Ambulatory/Procedural Areas - Information given) No - Patient declined Yes - EScientist, clinical (histocompatibility and immunogenetics)given;No - patient declined information Yes - Educational  materials given    Current Medications (verified) Outpatient Encounter Medications as of 06/23/2022  Medication Sig   amLODipine-benazepril (LOTREL) 10-40 MG capsule TAKE 1 CAPSULE BY MOUTH DAILY   acetaminophen (TYLENOL) 325 MG tablet Take 2 tablets (650 mg total) by mouth every 6 (six) hours as needed for mild pain (or Fever >/= 101).   albuterol (VENTOLIN HFA) 108 (90 Base) MCG/ACT inhaler INHALE 2 PUFFS INTO THE LUNGS EVERY 4 HOURS AS NEEDED FOR WHEEZING OR SHORTNESS OF BREATH.   ALPRAZolam (XANAX) 0.25 MG tablet TAKE 1 TABLET BY MOUTH 2 TIMES DAILY AS NEEDED.   aspirin EC 81 MG tablet Take 81 mg by mouth daily. Swallow whole.   benzonatate (TESSALON PERLES) 100 MG capsule Take 1 capsule (100 mg total) by mouth 3 (three) times daily as needed for cough. (Patient not taking: Reported on 05/19/2022)   cloNIDine (CATAPRES) 0.1 MG tablet TAKE 1 TABLET BY MOUTH TWICE A DAY   ferrous sulfate 325 (65 FE) MG EC tablet Take 325 mg by mouth daily with breakfast.   Fexofenadine HCl (MUCINEX ALLERGY PO) Take by mouth as needed.   Fluticasone-Umeclidin-Vilant (TRELEGY ELLIPTA) 200-62.5-25 MCG/ACT AEPB Inhale 1 puff into the lungs daily.   Fluticasone-Umeclidin-Vilant (TRELEGY ELLIPTA) 200-62.5-25 MCG/ACT AEPB Inhale 1 puff into the lungs daily.   Fluticasone-Umeclidin-Vilant (TRELEGY ELLIPTA) 200-62.5-25 MCG/ACT AEPB Inhale 1 puff into the lungs daily at 6 (six) AM.   furosemide (LASIX) 20 MG tablet TAKE 1 TABLET BY MOUTH IN THE  MORNING   gabapentin (NEURONTIN) 300 MG capsule TAKE 2 CAPSULE BY MOUTH 3 TIMES  DAILY   hydrALAZINE (APRESOLINE)  100 MG tablet Take 50 mg by mouth 3 (three) times daily.   lovastatin (MEVACOR) 40 MG tablet TAKE 1 TABLET BY MOUTH AT  BEDTIME   meclizine (ANTIVERT) 12.5 MG tablet TAKE 1 TABLET BY MOUTH 3 TIMES A DAY AS NEEDED FOR DIZZINESS   nystatin (MYCOSTATIN) 100000 UNIT/ML suspension Swish and spit 80m two times daily after using Symbicort.   oxybutynin (DITROPAN) 5 MG  tablet TAKE 1 TABLET BY MOUTH 3  TIMES DAILY   pantoprazole (PROTONIX) 40 MG tablet TAKE 1 TABLET BY MOUTH  DAILY   predniSONE (DELTASONE) 20 MG tablet Take 2 tablets (40 mg total) by mouth daily with breakfast. (Patient not taking: Reported on 05/19/2022)   Spacer/Aero-Holding CDorise BullionUse as directed with inhaler.   spironolactone (ALDACTONE) 25 MG tablet Take 25 mg by mouth daily. (Patient not taking: Reported on 05/19/2022)   sucralfate (CARAFATE) 1 g tablet Take 1 tablet (1 g total) by mouth 4 (four) times daily -  with meals and at bedtime.   tiZANidine (ZANAFLEX) 4 MG tablet TAKE 1 TABLET BY MOUTH EVERY 6 HOURS AS NEEDED FOR MUSCLE SPASMS.   traMADol (ULTRAM) 50 MG tablet Take 1-2 tablets (50-100 mg total) by mouth every 6 (six) hours as needed. for pain   traZODone (DESYREL) 50 MG tablet TAKE 1/2 TO 1 TABLET BY MOUTH AT BEDTIME AS NEEDED FOR SLEEP   triamcinolone cream (KENALOG) 0.1 % APPLY TO AFFECTED AREA TWICE A DAY   No facility-administered encounter medications on file as of 06/23/2022.    Allergies (verified) Chlorthalidone   History: Past Medical History:  Diagnosis Date   ABNORMAL CHEST XRAY 11/05/2008   ACUTE URIS OF UNSPECIFIED SITE 10/22/2007   ALLERGIC RHINITIS 12/09/2006   ANEMIA 01/14/2009   Anxiety    Arthritis    ASTHMA 12/09/2006   Asthma    Cataract    bilateral -removed   CHF (congestive heart failure) ([facility]    Chronic pain syndrome 02/16/2009   COPD 12/09/2006   DEGENERATIVE JOINT DISEASE 12/09/2006   DIZZINESS 03/30/2010   DYSPNEA 01/27/2010   with heavy exertion   FREQUENCY, URINARY 03/30/2010   GERD 12/09/2006   Glossitis [id number]  HELICOBACTER PYLORI GASTRITIS, HX OF 12/09/2006   HIATAL HERNIA 03/01/2010   History of hiatal hernia    HYPERLIPIDEMIA 04/18/2007   HYPERSOMNIA 08/31/2008   HYPERTENSION 12/09/2006   INSOMNIA-SLEEP DISORDER-UNSPEC 11/05/2008   LOW BACK PAIN 12/09/2006   Morbid obesity ([facility] 12/09/2006   Nonspecific  (abnormal) findings on radiological and other examination of body structure 11/05/2008   OSTEOPOROSIS 12/09/2006   OVERACTIVE BLADDER 04/14/2008   PALPITATIONS, RECURRENT 01/27/2010   Stroke ([facility]    pt. stated light one years ago   SYNCOPE 11/05/2008   TRANSIENT ISCHEMIC ATTACK, HX OF 12/09/2006   Past Surgical History:  Procedure Laterality Date   COLONOSCOPY     LUMBAR DISC SURGERY     TONSILLECTOMY     as a child   TOTAL HIP ARTHROPLASTY Right 11/06/2012   Procedure: RIGHT TOTAL HIP ARTHROPLASTY WITH ACETABULAR AUTOGRAFT;  Surgeon: FGearlean Alf MD;  Location: [facility];  Service: Orthopedics;  Laterality: Right;   TOTAL HIP ARTHROPLASTY Left 02/18/2014   Procedure: LEFT TOTAL HIP ARTHROPLASTY;  Surgeon: FGearlean Alf MD;  Location: [facility];  Service: Orthopedics;  Laterality: Left;   TUBAL LIGATION     Family History  Problem Relation Age of Onset   Heart disease Mother        Had  pacemaker   Lung cancer Father    Hypertension Brother    Diabetes Neg Hx    Colon cancer Neg Hx    Colon polyps Neg Hx    Kidney disease Neg Hx    Gallbladder disease Neg Hx    Esophageal cancer Neg Hx    Allergic rhinitis Neg Hx    Angioedema Neg Hx    Asthma Neg Hx    Atopy Neg Hx    Eczema Neg Hx    Immunodeficiency Neg Hx    Urticaria Neg Hx    Social History   Socioeconomic History   Marital status: Widowed    Spouse name: Not on file   Number of children: 10   Years of education: Not on file   Highest education level: Not on file  Occupational History   Occupation: Retired  Tobacco Use   Smoking status: Former    Packs/day: 0.50    Years: 25.00    Total pack years: 12.50    Types: Cigarettes    Quit date: 10/29/1972    Years since quitting: 49.6   Smokeless tobacco: Never  Vaping Use   Vaping Use: Never used  Substance and Sexual Activity   Alcohol use: No    Alcohol/week: 0.0 standard drinks of alcohol   Drug use: No   Sexual activity: Not Currently  Other Topics  Concern   Not on file  Social History Narrative   Not on file   Social Determinants of Health   Financial Resource Strain: Low Risk  (06/23/2022)   Overall Financial Resource Strain (CARDIA)    Difficulty of Paying Living Expenses: Not hard at all  Food Insecurity: No Food Insecurity (06/23/2022)   Hunger Vital Sign    Worried About Running Out of Food in the Last Year: Never true    [place] in the Last Year: Never true  Transportation Needs: No Transportation Needs (06/23/2022)   PRAPARE - Hydrologist (Medical): No    Lack of Transportation (Non-Medical): No  Physical Activity: Inactive (06/23/2022)   Exercise Vital Sign    Days of Exercise per Week: 0 days    Minutes of Exercise per Session: 0 min  Stress: No Stress Concern Present (06/23/2022)   [place]    Feeling of Stress : Not at all  Social Connections: Moderately Integrated (06/23/2022)   Social Connection and Isolation Panel [NHANES]    Frequency of Communication with Friends and Family: More than three times a week    Frequency of Social Gatherings with Friends and Family: More than three times a week    Attends Religious Services: More than 4 times per year    Active Member of [employer] or Organizations: No    Attends Music therapist: More than 4 times per year    Marital Status: Widowed    Tobacco Counseling Counseling given: Not Answered   Clinical Intake:  Pre-visit preparation completed: Yes  Pain : 0-10 Pain Score: 10-Worst pain ever (with movement) Pain Type: Chronic pain Pain Location: Back Pain Orientation: Lower     BMI - recorded: 35.53 (03/08/2022) Nutritional Risks: None Diabetes: No  How often do you need to have someone help you when you read instructions, pamphlets, or other written materials from your doctor or pharmacy?: 1 - Never What is the last grade level you completed in  school?: HSG  Diabetic? No  Interpreter Needed?:  No  Information entered by :: Lisette Abu, LPN.   Activities of Daily Living    06/23/2022    4:34 PM  In your present state of health, do you have any difficulty performing the following activities:  Hearing? 0  Vision? 0  Difficulty concentrating or making decisions? 0  Walking or climbing stairs? 1  Dressing or bathing? 0  Doing errands, shopping? 1  Preparing Food and eating ? N  Using the Toilet? N  In the past six months, have you accidently leaked urine? Y  Do you have problems with loss of bowel control? N  Managing your Medications? N  Managing your Finances? Y  Housekeeping or managing your Housekeeping? Y    Patient Care Team: Biagio Borg, MD as PCP - General Randon Goldsmith, OT as Occupational Therapist (Occupational Therapy) Thana Ates, RN as [place], [place], [place] as Consulting Physician (Ophthalmology)  Indicate any recent Medical Services you may have received from other than [facility] providers in the past year (date may be approximate).     Assessment:   This is a routine wellness examination for Wendy Torres.  Hearing/Vision screen Hearing Screening - Comments:: Denies hearing difficulties   Vision Screening - Comments:: Wears rx glasses - up to date with routine eye exams with Dr. Launa Flight with [place] issues and exercise activities discussed: Current Exercise Habits: The patient does not participate in regular exercise at present, Exercise limited by: respiratory conditions(s);cardiac condition(s);orthopedic condition(s)   Goals Addressed   None   Depression Screen    06/23/2022    4:32 PM 05/19/2022   10:43 AM 03/15/2022    4:01 PM 12/23/2021    3:48 PM 11/02/2021    3:01 PM 10/15/2020   10:21 AM 06/21/2020    1:35 PM  PHQ 2/9 Scores  PHQ - 2 Score 0 0 0 0 0 0 0  PHQ- 9 Score     1      Fall Risk    06/23/2022    4:14 PM  12/23/2021    3:48 PM 11/02/2021    3:01 PM 10/15/2020   10:21 AM 06/21/2020    1:35 PM  [place] in the past year? 0 0 0 0 0  Number falls in past yr: 0 0 0 0   Injury with Fall? 0 0 0 0   Risk for fall due to : No Fall Risks   Impaired balance/gait   Follow up Falls prevention discussed        FALL RISK PREVENTION PERTAINING TO THE HOME:  Any stairs in or around the home? No  If so, are there any without handrails? No  Home free of loose throw rugs in walkways, pet beds, electrical cords, etc? Yes  Adequate lighting in your home to reduce risk of falls? Yes   ASSISTIVE DEVICES UTILIZED TO PREVENT FALLS:  Life alert? No  will call [employer] to check eligibility for device Use of a cane, walker or w/c? Yes  Grab bars in the bathroom? No  Shower chair or bench in shower? No  Elevated toilet seat or a handicapped toilet? Yes   TIMED UP AND GO:  Was the test performed? No . Telephonic Visit  Cognitive Function:        06/23/2022    4:35 PM  6CIT Screen  What Year? 0 points  What month? 0 points  What time? 0 points  Count back  from 20 0 points  Months in reverse 0 points  Repeat phrase 0 points  Total Score 0 points    Immunizations Immunization History  Administered Date(s) Administered   Fluad Quad(high Dose 65+) 01/29/2021   Influenza Split 04/07/2011, 02/28/2012   Influenza Whole 04/15/2004, 04/18/2007, 01/27/2010   Influenza, High Dose Seasonal PF 02/04/2018, 12/10/2018   Influenza,inj,Quad PF,6+ Mos 03/16/2020   Influenza,inj,quad, With Preservative 01/29/2017   PFIZER(Purple Top)SARS-COV-2 Vaccination 09/14/2019, 10/12/2019   Pneumococcal Conjugate-13 12/03/2013   Pneumococcal Polysaccharide-23 05/02/2003, 09/29/2008, 12/10/2018   Tdap 04/07/2011    TDAP status: Due, Education has been provided regarding the importance of this vaccine. Advised may receive this vaccine at local pharmacy or Health Dept. Aware to provide a copy of the vaccination record if  obtained from local pharmacy or Health Dept. Verbalized acceptance and understanding.  Flu Vaccine status: Due, Education has been provided regarding the importance of this vaccine. Advised may receive this vaccine at local pharmacy or Health Dept. Aware to provide a copy of the vaccination record if obtained from local pharmacy or Health Dept. Verbalized acceptance and understanding.  Pneumococcal vaccine status: Up to date  Covid-19 vaccine status: Information provided on how to obtain vaccines.   Qualifies for Shingles Vaccine? Yes   Zostavax completed No   Shingrix Completed?: No.    Education has been provided regarding the importance of this vaccine. Patient has been advised to call insurance company to determine out of pocket expense if they have not yet received this vaccine. Advised may also receive vaccine at local pharmacy or Health Dept. Verbalized acceptance and understanding.  Screening Tests Health Maintenance  Topic Date Due   Zoster Vaccines- Shingrix (1 of 2) Never done   DTaP/Tdap/Td (2 - Td or Tdap) 04/06/2021   INFLUENZA VACCINE  11/29/2021   Medicare Annual Wellness (AWV)  06/24/2023   Pneumonia Vaccine 4+ Years old  Completed   DEXA SCAN  Completed   HPV VACCINES  Aged Out   COVID-19 Vaccine  Discontinued    Health Maintenance  Health Maintenance Due  Topic Date Due   Zoster Vaccines- Shingrix (1 of 2) Never done   DTaP/Tdap/Td (2 - Td or Tdap) 04/06/2021   INFLUENZA VACCINE  11/29/2021    Colorectal cancer screening: No longer required.   Mammogram status: No longer required due to age.  Bone Density status: No longer required due to age.  Lung Cancer Screening: (Low Dose CT Chest recommended if Age 25-80 years, 30 pack-year currently smoking OR have quit w/in 15years.) does not qualify.   Lung Cancer Screening Referral: no  Additional Screening:  Hepatitis C Screening: does not qualify; Completed no  Vision Screening: Recommended annual  ophthalmology exams for early detection of glaucoma and other disorders of the eye. Is the patient up to date with their annual eye exam?  Yes  Who is the provider or what is the name of the office in which the patient attends annual eye exams? Stephannie Li, OD. If pt is not established with a provider, would they like to be referred to a provider to establish care? No .   Dental Screening: Recommended annual dental exams for proper oral hygiene  Community Resource Referral / Chronic Care Management: CRR required this visit?  No   CCM required this visit?  No      Plan:     I have personally reviewed and noted the following in the patient's chart:   Medical and social history Use of alcohol, tobacco  or illicit drugs  Current medications and supplements including opioid prescriptions. Patient is not currently taking opioid prescriptions. Functional ability and status Nutritional status Physical activity Advanced directives List of other physicians Hospitalizations, surgeries, and ER visits in previous 12 months Vitals Screenings to include cognitive, depression, and falls Referrals and appointments  In addition, I have reviewed and discussed with patient certain preventive protocols, quality metrics, and best practice recommendations. A written personalized care plan for preventive services as well as general preventive health recommendations were provided to patient.     Wendy Flow, LPN   [id number]   Nurse Notes: Normal cognitive status assessed by direct observation by this Nurse Health Advisor. No abnormalities found.

## 2022-06-23 NOTE — Patient Instructions (Signed)
Wendy Torres , Thank you for taking time to come for your Medicare Wellness Visit. I appreciate your ongoing commitment to your health goals. Please review the following plan we discussed and let me know if I can assist you in the future.   These are the goals we discussed:  Goals       Aging Gracefully RN (pt-stated)      Goal: Patient would like to have decreased pain in her back and knees in the next 120 days.   05/19/2022  Assessment:  Patient reports pain in her lower back and knees. Reports she took a tramadol this am for pain. Reports she takes a tramadol daily for pain.  No current pain when she is sitting.  Reports pain with activity like walking with her walker in the home.  Appears steady on her feet.    Interventions: provided [facility] calendar. Provided my contact information   Brainstorming to review ways to help with pain.  Plan: Next home visit planned for 06/09/2022  CLIENT/RN ACTION PLAN - PAIN  Registered Nurse:  Tomasa Rand   Date:  05/19/2022  Client Name:Wendy Torres Client ID:    Target Area:  PAIN   Why Problem May Occur:  arthirits  Target Goal: Patient will report decrease pain in lower back and knees in the next 120 days.  STRATEGIES Coping Strategies Ideas:  Heat  05/19/2022  reviewed Use heating pad or warm towel on painful area no more than 20 minutes at a time. Don't sleep with a heating pad on - it could burn your skin   Ice  05/19/2022  reviewed Use ice pack or frozen bag of vegetables on painful area.   Leave cold pack on for less than 20 minutes. Ice can burn your skin.  Don't leave ice on longer than 20 minutes.   Activity and Exercise  Next home visit Joints get stiff when not in use Aging Gracefully Exercises Walking (inside or outside) [employer]:  cooking, cleaning, Psychologist, forensic   Listen to Music Listening to music can decrease pain. Turn off the TV and turn on the radio.   Prayer/Meditation Prayer and  meditation can decrease pain   Other   Acetaminophen/Tylenol (same medication  05/19/2022  Reviewed medication list  DO NOT take more Acetaminophen than below because it can be bad for your liver. 500 mg. tablets:  2 tablets every 8 hours, as needed for pain.  Do not take more than 6, (500 mg) tablets every day. 300 mg. tablets:  2 tablets every 6 hours, as needed for pain.  Do not take more than 8 (325 mg) tablets every day. Look for Acetaminophen/Tylenol in other medicines you buy over the counter.   Still in Pain There ae a lot of different kinds of pain medicines:  creams, patches and supplements. Ask your Healthcare Provider about other pain medications.   Stop Smoking  05/19/2022  N/A Smoking can make arthritis worse.   Stop Pain Before it gets bad. Once in pain It is harder to get rid of. Begin pain relief while you have mild pain.   Other   Other   ;  PRACTICE It is important to practice the strategies so we can determine if they will be effective in helping to reach the goal.    Follow these specific recommendations:  Take medications as prescribed.    Be as active as possible and continue your chair exercises.   If strategy does not work the  first time, try it again.  We may make some changes over the next few sessions.    We may make some changes over the next few sessions, based on how they work.   Tomasa Rand RN, BSN, CEN RN Case Freight forwarder for [facility] [employer]: [phone]   06/09/2022 Assessment:  Patient reports 10/10 pain to her right knee today.  Reports tramadol not working.  Reports using ice and heat.  Interventions:  Provided written home exercise plan  and demonstrated how to do exercises. Reviewed concern about knee pain with patient. Reviewed that she can take her tramadol every 6 hours.  Called in refill for patient and she is not able to do this herself. Sent secure message to MD to inform of pain. He suggested  patient use OTC Voltaren and follow up with ortho.  I reviewed this with patient and daughter.  Encouraged patient to do her exercises when she can tolerate them.   PLAN: follow up on March 8.    Tomasa Rand RN, BSN, CEN RN Case Freight forwarder for [facility] [employer]: [phone]       Patient Stated      She would like to be able to access her shower and feel safe getting in/out of bathroom and on/off toilet. Tub to shower conversion with grab bars, hand held shower and lower holder), shower seat, toilet hands that do not secure via floor (current tripping hazzard due to small bathroom, especially with rollator), perhaps widen bathroom door if able.      Patient Stated      Safety getting in and out of her house. Permanent ramp with door threshold made show no hump to go over with rollator or wheelchair.      Patient Stated      Would like to be able to put on and take off compression socks easier (look into strategies for donning, dressing stick for doffing.        This is a list of the screening recommended for you and due dates:  Health Maintenance  Topic Date Due   Zoster (Shingles) Vaccine (1 of 2) Never done   DTaP/Tdap/Td vaccine (2 - Td or Tdap) 04/06/2021   Flu Shot  11/29/2021   Medicare Annual Wellness Visit  06/24/2023   Pneumonia Vaccine  Completed   DEXA scan (bone density measurement)  Completed   HPV Vaccine  Aged Out   COVID-19 Vaccine  Discontinued    Advanced directives: No  Conditions/risks identified: Yes  Next appointment: Follow up in one year for your annual wellness visit.   Preventive Care 79 Years and Older, Female Preventive care refers to lifestyle choices and visits with your health care provider that can promote health and wellness. What does preventive care include? A yearly physical exam. This is also called an annual well check. Dental exams once or twice a year. Routine eye exams. Ask your health care provider  how often you should have your eyes checked. Personal lifestyle choices, including: Daily care of your teeth and gums. Regular physical activity. Eating a healthy diet. Avoiding tobacco and drug use. Limiting alcohol use. Practicing safe sex. Taking low-dose aspirin every day. Taking vitamin and mineral supplements as recommended by your health care provider. What happens during an annual well check? The services and screenings done by your health care provider during your annual well check will depend on your age, overall health, lifestyle risk factors, and family history of disease. Counseling  Your health care provider may ask you questions about your: Alcohol use. Tobacco use. Drug use. Emotional well-being. Home and relationship well-being. Sexual activity. Eating habits. History of falls. Memory and ability to understand (cognition). Work and work Statistician. Reproductive health. Screening  You may have the following tests or measurements: Height, weight, and BMI. Blood pressure. Lipid and cholesterol levels. These may be checked every 5 years, or more frequently if you are over 63 years old. Skin check. Lung cancer screening. You may have this screening every year starting at age 56 if you have a 30-pack-year history of smoking and currently smoke or have quit within the past 15 years. Fecal occult blood test (FOBT) of the stool. You may have this test every year starting at age 74. Flexible sigmoidoscopy or colonoscopy. You may have a sigmoidoscopy every 5 years or a colonoscopy every 10 years starting at age 8. Hepatitis C blood test. Hepatitis B blood test. Sexually transmitted disease (STD) testing. Diabetes screening. This is done by checking your blood sugar (glucose) after you have not eaten for a while (fasting). You may have this done every 1-3 years. Bone density scan. This is done to screen for osteoporosis. You may have this done starting at age  14. Mammogram. This may be done every 1-2 years. Talk to your health care provider about how often you should have regular mammograms. Talk with your health care provider about your test results, treatment options, and if necessary, the need for more tests. Vaccines  Your health care provider may recommend certain vaccines, such as: Influenza vaccine. This is recommended every year. Tetanus, diphtheria, and acellular pertussis (Tdap, Td) vaccine. You may need a Td booster every 10 years. Zoster vaccine. You may need this after age 90. Pneumococcal 13-valent conjugate (PCV13) vaccine. One dose is recommended after age 20. Pneumococcal polysaccharide (PPSV23) vaccine. One dose is recommended after age 20. Talk to your health care provider about which screenings and vaccines you need and how often you need them. This information is not intended to replace advice given to you by your health care provider. Make sure you discuss any questions you have with your health care provider. Document Released: 05/14/2015 Document Revised: 01/05/2016 Document Reviewed: 02/16/2015 Elsevier Interactive Patient Education  2017 [place] Prevention in the Home Falls can cause injuries. They can happen to people of all ages. There are many things you can do to make your home safe and to help prevent falls. What can I do on the outside of my home? Regularly fix the edges of walkways and driveways and fix any cracks. Remove anything that might make you trip as you walk through a door, such as a raised step or threshold. Trim any bushes or trees on the path to your home. Use bright outdoor lighting. Clear any walking paths of anything that might make someone trip, such as rocks or tools. Regularly check to see if handrails are loose or broken. Make sure that both sides of any steps have handrails. Any raised decks and porches should have guardrails on the edges. Have any leaves, snow, or ice cleared  regularly. Use sand or salt on walking paths during winter. Clean up any spills in your garage right away. This includes oil or grease spills. What can I do in the bathroom? Use night lights. Install grab bars by the toilet and in the tub and shower. Do not use towel bars as grab bars. Use non-skid mats or decals in the tub  or shower. If you need to sit down in the shower, use a plastic, non-slip stool. Keep the floor dry. Clean up any water that spills on the floor as soon as it happens. Remove soap buildup in the tub or shower regularly. Attach bath mats securely with double-sided non-slip rug tape. Do not have throw rugs and other things on the floor that can make you trip. What can I do in the bedroom? Use night lights. Make sure that you have a light by your bed that is easy to reach. Do not use any sheets or blankets that are too big for your bed. They should not hang down onto the floor. Have a firm chair that has side arms. You can use this for support while you get dressed. Do not have throw rugs and other things on the floor that can make you trip. What can I do in the kitchen? Clean up any spills right away. Avoid walking on wet floors. Keep items that you use a lot in easy-to-reach places. If you need to reach something above you, use a strong step stool that has a grab bar. Keep electrical cords out of the way. Do not use floor polish or wax that makes floors slippery. If you must use wax, use non-skid floor wax. Do not have throw rugs and other things on the floor that can make you trip. What can I do with my stairs? Do not leave any items on the stairs. Make sure that there are handrails on both sides of the stairs and use them. Fix handrails that are broken or loose. Make sure that handrails are as long as the stairways. Check any carpeting to make sure that it is firmly attached to the stairs. Fix any carpet that is loose or worn. Avoid having throw rugs at the top or  bottom of the stairs. If you do have throw rugs, attach them to the floor with carpet tape. Make sure that you have a light switch at the top of the stairs and the bottom of the stairs. If you do not have them, ask someone to add them for you. What else can I do to help prevent falls? Wear shoes that: Do not have high heels. Have rubber bottoms. Are comfortable and fit you well. Are closed at the toe. Do not wear sandals. If you use a stepladder: Make sure that it is fully opened. Do not climb a closed stepladder. Make sure that both sides of the stepladder are locked into place. Ask someone to hold it for you, if possible. Clearly mark and make sure that you can see: Any grab bars or handrails. First and last steps. Where the edge of each step is. Use tools that help you move around (mobility aids) if they are needed. These include: Canes. Walkers. Scooters. Crutches. Turn on the lights when you go into a dark area. Replace any light bulbs as soon as they burn out. Set up your furniture so you have a clear path. Avoid moving your furniture around. If any of your floors are uneven, fix them. If there are any pets around you, be aware of where they are. Review your medicines with your doctor. Some medicines can make you feel dizzy. This can increase your chance of falling. Ask your doctor what other things that you can do to help prevent falls. This information is not intended to replace advice given to you by your health care provider. Make sure you discuss any questions you  have with your health care provider. Document Released: 02/11/2009 Document Revised: 09/23/2015 Document Reviewed: 05/22/2014 Elsevier Interactive Patient Education  2017 [employer].

## 2022-06-27 NOTE — Progress Notes (Unsigned)
FOLLOW UP Date of Service/Encounter:  06/28/22   Subjective:  Wendy Torres (DOB: [birth year]) is a 82 y.o. female who returns to the [facility] on 06/28/2022 in re-evaluation of the following: COPD with asthma, glossitis and sicca syndrome  History obtained from: chart review and patient and daughter .  For Review, LV was on 07/06/21  with Dr.Jezabel Lecker seen for routine follow-up.Remained on oxygen, without bubble reservoir.  Using biotene and nystatin to help with dry mouth. Therwise asthma controlled.  She was continued on same plan.   Today presents for follow-up. Her ear canals are itchy.  She puts her finger in her eat to scratch. Using Q-tips.  Her nose hurts so bad and is bleeding.  Her throat also hurts and is really dry.  She continues to wear oxygen 24 hours.  She is on 3 L of oxygen. She did start using the bubble reservoir.   Not using saline gel.   Her lung doctor switched her to Trelegy. She is no longer using Symbicort. She was told to limit water consumption to 2.5 L by her kidney doctor.   Allergies as of 06/28/2022       Reactions   Chlorthalidone         Medication List        Accurate as of June 28, 2022 12:32 PM. If you have any questions, ask your nurse or doctor.          acetaminophen 325 MG tablet Commonly known as: TYLENOL Take 2 tablets (650 mg total) by mouth every 6 (six) hours as needed for mild pain (or Fever >/= 101).   albuterol 108 (90 Base) MCG/ACT inhaler Commonly known as: VENTOLIN HFA INHALE 2 PUFFS INTO THE LUNGS EVERY 4 HOURS AS NEEDED FOR WHEEZING OR SHORTNESS OF BREATH.   ALPRAZolam 0.25 MG tablet Commonly known as: XANAX TAKE 1 TABLET BY MOUTH 2 TIMES DAILY AS NEEDED.   amLODipine-benazepril 10-40 MG capsule Commonly known as: LOTREL TAKE 1 CAPSULE BY MOUTH DAILY   aspirin EC 81 MG tablet Take 81 mg by mouth daily. Swallow whole.   benzonatate 100 MG capsule Commonly known as: Tessalon Perles Take 1  capsule (100 mg total) by mouth 3 (three) times daily as needed for cough.   cloNIDine 0.1 MG tablet Commonly known as: CATAPRES TAKE 1 TABLET BY MOUTH TWICE A DAY   ferrous sulfate 325 (65 FE) MG EC tablet Take 325 mg by mouth daily with breakfast.   furosemide 20 MG tablet Commonly known as: LASIX TAKE 1 TABLET BY MOUTH IN THE  MORNING   gabapentin 300 MG capsule Commonly known as: NEURONTIN TAKE 2 CAPSULE BY MOUTH 3 TIMES  DAILY   hydrALAZINE 100 MG tablet Commonly known as: APRESOLINE Take 50 mg by mouth 3 (three) times daily.   lovastatin 40 MG tablet Commonly known as: MEVACOR TAKE 1 TABLET BY MOUTH AT  BEDTIME   meclizine 12.5 MG tablet Commonly known as: ANTIVERT TAKE 1 TABLET BY MOUTH 3 TIMES A DAY AS NEEDED FOR DIZZINESS   MUCINEX ALLERGY PO Take by mouth as needed.   nystatin 100000 UNIT/ML suspension Commonly known as: MYCOSTATIN Swish and spit 46m two times daily after using Symbicort.   oxybutynin 5 MG tablet Commonly known as: DITROPAN TAKE 1 TABLET BY MOUTH 3  TIMES DAILY   pantoprazole 40 MG tablet Commonly known as: PROTONIX TAKE 1 TABLET BY MOUTH DAILY   predniSONE 20 MG tablet Commonly known as: DELTASONE  Take 2 tablets (40 mg total) by mouth daily with breakfast.   Spacer/Aero-Holding Dorise Bullion Use as directed with inhaler.   spironolactone 25 MG tablet Commonly known as: ALDACTONE Take 25 mg by mouth daily.   sucralfate 1 g tablet Commonly known as: Carafate Take 1 tablet (1 g total) by mouth 4 (four) times daily -  with meals and at bedtime.   tiZANidine 4 MG tablet Commonly known as: ZANAFLEX TAKE 1 TABLET BY MOUTH EVERY 6 HOURS AS NEEDED FOR MUSCLE SPASMS.   traMADol 50 MG tablet Commonly known as: ULTRAM Take 1-2 tablets (50-100 mg total) by mouth every 6 (six) hours as needed. for pain   traZODone 50 MG tablet Commonly known as: DESYREL TAKE 1/2 TO 1 TABLET BY MOUTH AT BEDTIME AS NEEDED FOR SLEEP   Trelegy Ellipta  200-62.5-25 MCG/ACT Aepb Generic drug: Fluticasone-Umeclidin-Vilant Inhale 1 puff into the lungs daily.   Trelegy Ellipta 200-62.5-25 MCG/ACT Aepb Generic drug: Fluticasone-Umeclidin-Vilant Inhale 1 puff into the lungs daily.   Trelegy Ellipta 200-62.5-25 MCG/ACT Aepb Generic drug: Fluticasone-Umeclidin-Vilant Inhale 1 puff into the lungs daily at 6 (six) AM.   triamcinolone cream 0.1 % Commonly known as: KENALOG APPLY TO AFFECTED AREA TWICE A DAY       Past Medical History:  Diagnosis Date   ABNORMAL CHEST XRAY 11/05/2008   ACUTE URIS OF UNSPECIFIED SITE 10/22/2007   ALLERGIC RHINITIS 12/09/2006   ANEMIA 01/14/2009   Anxiety    Arthritis    ASTHMA 12/09/2006   Asthma    Cataract    bilateral -removed   CHF (congestive heart failure) ([place])    Chronic pain syndrome 02/16/2009   COPD 12/09/2006   DEGENERATIVE JOINT DISEASE 12/09/2006   DIZZINESS 03/30/2010   DYSPNEA 01/27/2010   with heavy exertion   FREQUENCY, URINARY 03/30/2010   GERD 12/09/2006   Glossitis [id number]   HELICOBACTER PYLORI GASTRITIS, HX OF 12/09/2006   HIATAL HERNIA 03/01/2010   History of hiatal hernia    HYPERLIPIDEMIA 04/18/2007   HYPERSOMNIA 08/31/2008   HYPERTENSION 12/09/2006   INSOMNIA-SLEEP DISORDER-UNSPEC 11/05/2008   LOW BACK PAIN 12/09/2006   Morbid obesity ([place]) 12/09/2006   Nonspecific (abnormal) findings on radiological and other examination of body structure 11/05/2008   OSTEOPOROSIS 12/09/2006   OVERACTIVE BLADDER 04/14/2008   PALPITATIONS, RECURRENT 01/27/2010   Stroke ([place])    pt. stated light one years ago   SYNCOPE 11/05/2008   TRANSIENT ISCHEMIC ATTACK, HX OF 12/09/2006   Past Surgical History:  Procedure Laterality Date   COLONOSCOPY     LUMBAR DISC SURGERY     TONSILLECTOMY     as a child   TOTAL HIP ARTHROPLASTY Right 11/06/2012   Procedure: RIGHT TOTAL HIP ARTHROPLASTY WITH ACETABULAR AUTOGRAFT;  Surgeon: Gearlean Alf, MD;  Location: [facility];  Service:  Orthopedics;  Laterality: Right;   TOTAL HIP ARTHROPLASTY Left 02/18/2014   Procedure: LEFT TOTAL HIP ARTHROPLASTY;  Surgeon: Gearlean Alf, MD;  Location: [facility];  Service: Orthopedics;  Laterality: Left;   TUBAL LIGATION     Otherwise, there have been no changes to her past medical history, surgical history, family history, or social history.  ROS: All others negative except as noted per HPI.   Objective:  BP (!) 142/60   Pulse 68   Temp 98.2 F (36.8 C) (Temporal)   Resp 16   Ht '5\' 4"'$  (1.626 m) Comment: per patient/ in wheelchair  Wt 202 lb (91.6 kg) Comment: per patient/ in wheelchair  SpO2 91%   BMI 34.67 kg/m  Body mass index is 34.67 kg/m. Physical Exam: General Appearance:  Alert, cooperative, no distress, appears stated age  Head:  Normocephalic, without obvious abnormality, atraumatic  Eyes:  Conjunctiva clear, EOM's intact  Nose: Nares normal,  dry mucosa some dried blood in bilateral nares, oxygen cannulas present bilaterally, hypertrophic turbinates, and no visible anterior polyps  Ears Dry and flaky ear canals bilaterally  Throat: Lips, tongue normal; teeth and gums normal, normal posterior oropharynx  Neck: Supple, symmetrical  Lungs:   clear to auscultation bilaterally, Respirations unlabored, no coughing  Heart:  regular rate and rhythm and no murmur, Appears well perfused  Extremities: No edema  Skin: Skin color, texture, turgor normal, no rashes or lesions on visualized portions of skin  Neurologic: No gross deficits   Assessment/Plan  Ms. Coby has COPD and CHF being managed by appropriate specialists. She has a history of allergic rhinitis, but due to oxygen use, is not a candidate for oral antihistamines and due to her complex medical history, she is not a candidate for allergy injections.  It is unclear if she is even still allergic as her allergy testing has not been updated recently. We essentially are following her to help with medication side  effects which has been dry mucosa in airway passages. She does have some dry ear canals which I will prescribe topical steroid oil drops for her ears.  Otherwise, plan as follows-  1. Continue inhalers as per Pulmonary  2. Continue nystatin 5 mls swish and spit after 2 times a day after inhalers  3. Continue Biotene mouth rinse daily  4. Remain away from antihistamine use  5. Use distilled water or saline in bubble oxygen reservoir  6. Ayr gel use in both nostrils twice daily or as needed to prevent nasal dryness and bleeding from oxygen use  - sample saline gel provided.  7. Fluocinolone ear drops - use 2-3 drops up to twice daily as needed for ear itching   8. Do not use Q-tips for ear cleaning  6. Return to clinic in 1 year or earlier if needed  Sigurd Sos, MD  [facility]

## 2022-06-28 ENCOUNTER — Encounter: Payer: Self-pay | Admitting: Pulmonary Disease

## 2022-06-28 ENCOUNTER — Ambulatory Visit: Payer: Medicare Other | Admitting: Internal Medicine

## 2022-06-28 ENCOUNTER — Encounter: Payer: Self-pay | Admitting: Internal Medicine

## 2022-06-28 ENCOUNTER — Other Ambulatory Visit: Payer: Self-pay

## 2022-06-28 VITALS — BP 142/60 | HR 68 | Temp 98.2°F | Resp 16 | Ht 64.0 in | Wt 202.0 lb

## 2022-06-28 DIAGNOSIS — J439 Emphysema, unspecified: Secondary | ICD-10-CM

## 2022-06-28 DIAGNOSIS — J4489 Other specified chronic obstructive pulmonary disease: Secondary | ICD-10-CM | POA: Diagnosis not present

## 2022-06-28 DIAGNOSIS — I5032 Chronic diastolic (congestive) heart failure: Secondary | ICD-10-CM | POA: Diagnosis not present

## 2022-06-28 DIAGNOSIS — J309 Allergic rhinitis, unspecified: Secondary | ICD-10-CM | POA: Diagnosis not present

## 2022-06-28 MED ORDER — FLUOCINOLONE ACETONIDE 0.01 % EX SOLN: Freq: Two times a day (BID) | CUTANEOUS | 0 refills | Status: DC | PRN

## 2022-06-28 NOTE — Patient Instructions (Addendum)
  1. Continue inhalers as per Pulmonary  2. Continue nystatin 5 mls swish and spit after 2 times a day after inhalers  3. Continue Biotene mouth rinse daily  4. Remain away from antihistamine use  5. Use distilled water or saline in bubble oxygen reservoir  6. Ayr gel use in both nostrils twice daily or as needed to prevent nasal dryness and bleeding from oxygen use  7. Fluocinolone ear drops - use 2-3 drops up to twice daily as needed for ear itching   8. Do not use Q-tips for ear cleaning  6. Return to clinic in 1 year or earlier if needed

## 2022-07-01 ENCOUNTER — Other Ambulatory Visit: Payer: Self-pay | Admitting: Internal Medicine

## 2022-07-05 ENCOUNTER — Other Ambulatory Visit: Payer: Self-pay | Admitting: Internal Medicine

## 2022-07-06 NOTE — Telephone Encounter (Signed)
Please refill as per office routine med refill policy (all routine meds to be refilled for 3 mo or monthly (per pt preference) up to one year from last visit, then month to month grace period for 3 mo, then further med refills will have to be denied) ? ?

## 2022-07-07 ENCOUNTER — Other Ambulatory Visit: Payer: Self-pay

## 2022-07-07 NOTE — Patient Instructions (Signed)
Visit Information  Thank you for taking time to visit with me today. Please don't hesitate to contact me if I can be of assistance to you before our next scheduled telephone appointment.  Following are the goals we discussed today:   Goals Addressed               This Visit's Progress     Aging Gracefully RN (pt-stated)        Goal: Patient would like to have decreased pain in her back and knees in the next 120 days.   05/19/2022  Assessment:  Patient reports pain in her lower back and knees. Reports she took a tramadol this am for pain. Reports she takes a tramadol daily for pain.  No current pain when she is sitting.  Reports pain with activity like walking with her walker in the home.  Appears steady on her feet.    Interventions: provided [facility] calendar. Provided my contact information   Brainstorming to review ways to help with pain.  Plan: Next home visit planned for 06/09/2022  CLIENT/RN ACTION PLAN - PAIN  Registered Nurse:  Tomasa Rand   Date:  05/19/2022  Client Name:Wendy Torres Client ID:    Target Area:  PAIN   Why Problem May Occur:  arthirits  Target Goal: Patient will report decrease pain in lower back and knees in the next 120 days.  STRATEGIES Coping Strategies Ideas:  Heat  05/19/2022  reviewed Use heating pad or warm towel on painful area no more than 20 minutes at a time. Don't sleep with a heating pad on - it could burn your skin   Ice  05/19/2022  reviewed Use ice pack or frozen bag of vegetables on painful area.   Leave cold pack on for less than 20 minutes. Ice can burn your skin.  Don't leave ice on longer than 20 minutes.   Activity and Exercise  Next home visit Joints get stiff when not in use Aging Gracefully Exercises Walking (inside or outside) [employer]:  cooking, cleaning, Psychologist, forensic   Listen to Music Listening to music can decrease pain. Turn off the TV and turn on the radio.   Prayer/Meditation  Prayer and meditation can decrease pain   Other   Acetaminophen/Tylenol (same medication  05/19/2022  Reviewed medication list  DO NOT take more Acetaminophen than below because it can be bad for your liver. 500 mg. tablets:  2 tablets every 8 hours, as needed for pain.  Do not take more than 6, (500 mg) tablets every day. 300 mg. tablets:  2 tablets every 6 hours, as needed for pain.  Do not take more than 8 (325 mg) tablets every day. Look for Acetaminophen/Tylenol in other medicines you buy over the counter.   Still in Pain There ae a lot of different kinds of pain medicines:  creams, patches and supplements. Ask your Healthcare Provider about other pain medications.   Stop Smoking  05/19/2022  N/A Smoking can make arthritis worse.   Stop Pain Before it gets bad. Once in pain It is harder to get rid of. Begin pain relief while you have mild pain.   Other   Other   ;  PRACTICE It is important to practice the strategies so we can determine if they will be effective in helping to reach the goal.    Follow these specific recommendations:  Take medications as prescribed.    Be as active as possible and continue your chair  exercises.   If strategy does not work the first time, try it again.  We may make some changes over the next few sessions.    We may make some changes over the next few sessions, based on how they work.   Tomasa Rand RN, BSN, CEN RN Case Freight forwarder for [place] Mobile: [phone]   06/09/2022 Assessment:  Patient reports 10/10 pain to her right knee today.  Reports tramadol not working.  Reports using ice and heat.  Interventions:  Provided written home exercise plan  and demonstrated how to do exercises. Reviewed concern about knee pain with patient. Reviewed that she can take her tramadol every 6 hours.  Called in refill for patient and she is not able to do this herself. Sent secure message to MD to inform of pain. He  suggested patient use OTC Voltaren and follow up with ortho.  I reviewed this with patient and daughter.  Encouraged patient to do her exercises when she can tolerate them.   PLAN: follow up on March 8.    Tomasa Rand RN, BSN, CEN RN Case Freight forwarder for [employer] Mobile: [phone]   07/07/2022 Assessment:  Continues to have  severe knee pain.  7/10 today.  Continues to do home exercises.  Interventions: reviewed with family and patient concern for swelling to both knees and worsening pain. Recommended ortho follow up to evaluate swelling .  Encouraged patient to continue to take her medications as prescribed.   Plan: Follow up in 1 month for 4th home visit.   Tomasa Rand RN, BSN, CEN RN Case Freight forwarder for [employer] Mobile: [phone]          Our next appointment is on 08/04/2022 at 1030  If you are experiencing a Mental Health or [place] or need someone to talk to, please call the Suicide and Crisis Lifeline: 988 call the [place] National Suicide Prevention Lifeline: 618-876-3891 or TTY: (571)302-6768 TTY (620)309-2383) to talk to a trained counselor call 1-800-273-TALK (toll free, 24 hour hotline) go to [facility] [phone]) call 911   The patient verbalized understanding of instructions, educational materials, and care plan provided today and agreed to receive a mailed copy of patient instructions, educational materials, and care plan.   Tomasa Rand RN, BSN, Careers information officer for [employer] Mobile: [phone]

## 2022-07-07 NOTE — Patient Outreach (Signed)
Aging Gracefully Program  RN Visit  07/07/2022  Wendy Torres 10/14/1940 [id number]  Visit:  RN Visit Number: 3- Third Visit  RN TIME CALCULATION: Start TIme:  RN Start Time Calculation: [id number] End Time:  RN Stop Time Calculation: [id number] Total Minutes:  RN Time Calculation: 46  Readiness To Change Score:     Universal RN Interventions: Calendar Distribution: Yes Exercise Review: Yes Medications: Yes Medication Changes: Yes Mood: Yes Pain: Yes PCP Advocacy/Support: No Fall Prevention: Yes Incontinence: Yes Clinician View Of Client Situation: Home neat and clean.  Patient forgot appointment.  C/o pain rubbing knees with hands.  right knee swollen. Client View Of His/Her Situation: Patient reports severe pain in her knees. Patient reports that she has knee pain all the time. Continues to do her home exercises daily.  using rollator.  Reports recent nose bleed.  Reports itching ears.  Reports she got a humidifer for her concentrator but did not get an adaptor.   using nasal spray t help.  Healthcare Provider Communication: none    Clinician View of Client Situation: Clinician View Of Client Situation: Home neat and clean.  Patient forgot appointment.  C/o pain rubbing knees with hands.  right knee swollen. Client's View of His/Her Situation: Client View Of His/Her Situation: Patient reports severe pain in her knees. Patient reports that she has knee pain all the time. Continues to do her home exercises daily.  using rollator.  Reports recent nose bleed.  Reports itching ears.  Reports she got a humidifer for her concentrator but did not get an adaptor.   using nasal spray t help.  Medication Assessment: EMR  Changes noted from ENT Visit.    OT Update: pending home modifications. Ramp was painted.  Session Summary: concern for patents severe knee pain which is not getting any better, Encouraged patient and family to call ortho and get an appointment.    Goals Addressed                This Visit's Progress     Aging Gracefully RN (pt-stated)        Goal: Patient would like to have decreased pain in her back and knees in the next 120 days.   05/19/2022  Assessment:  Patient reports pain in her lower back and knees. Reports she took a tramadol this am for pain. Reports she takes a tramadol daily for pain.  No current pain when she is sitting.  Reports pain with activity like walking with her walker in the home.  Appears steady on her feet.    Interventions: provided [facility] calendar. Provided my contact information   Brainstorming to review ways to help with pain.  Plan: Next home visit planned for 06/09/2022  CLIENT/RN ACTION PLAN - PAIN  Registered Nurse:  Tomasa Rand   Date:  05/19/2022  Client Name:Wendy Torres Client ID:    Target Area:  PAIN   Why Problem May Occur:  arthirits  Target Goal: Patient will report decrease pain in lower back and knees in the next 120 days.  STRATEGIES Coping Strategies Ideas:  Heat  05/19/2022  reviewed Use heating pad or warm towel on painful area no more than 20 minutes at a time. Don't sleep with a heating pad on - it could burn your skin   Ice  05/19/2022  reviewed Use ice pack or frozen bag of vegetables on painful area.   Leave cold pack on for less than 20 minutes. Ice can burn your skin.  Don't leave ice on longer than 20 minutes.   Activity and Exercise  Next home visit Joints get stiff when not in use Aging Gracefully Exercises Walking (inside or outside) [employer]:  cooking, cleaning, Psychologist, forensic   Listen to Music Listening to music can decrease pain. Turn off the TV and turn on the radio.   Prayer/Meditation Prayer and meditation can decrease pain   Other   Acetaminophen/Tylenol (same medication  05/19/2022  Reviewed medication list  DO NOT take more Acetaminophen than below because it can be bad for your liver. 500 mg. tablets:  2 tablets every 8 hours, as needed for  pain.  Do not take more than 6, (500 mg) tablets every day. 300 mg. tablets:  2 tablets every 6 hours, as needed for pain.  Do not take more than 8 (325 mg) tablets every day. Look for Acetaminophen/Tylenol in other medicines you buy over the counter.   Still in Pain There ae a lot of different kinds of pain medicines:  creams, patches and supplements. Ask your Healthcare Provider about other pain medications.   Stop Smoking  05/19/2022  N/A Smoking can make arthritis worse.   Stop Pain Before it gets bad. Once in pain It is harder to get rid of. Begin pain relief while you have mild pain.   Other   Other   ;  PRACTICE It is important to practice the strategies so we can determine if they will be effective in helping to reach the goal.    Follow these specific recommendations:  Take medications as prescribed.    Be as active as possible and continue your chair exercises.   If strategy does not work the first time, try it again.  We may make some changes over the next few sessions.    We may make some changes over the next few sessions, based on how they work.   Tomasa Rand RN, BSN, CEN RN Case Freight forwarder for [place] Mobile: [phone]   06/09/2022 Assessment:  Patient reports 10/10 pain to her right knee today.  Reports tramadol not working.  Reports using ice and heat.  Interventions:  Provided written home exercise plan  and demonstrated how to do exercises. Reviewed concern about knee pain with patient. Reviewed that she can take her tramadol every 6 hours.  Called in refill for patient and she is not able to do this herself. Sent secure message to MD to inform of pain. He suggested patient use OTC Voltaren and follow up with ortho.  I reviewed this with patient and daughter.  Encouraged patient to do her exercises when she can tolerate them.   PLAN: follow up on March 8.    Tomasa Rand RN, BSN, CEN RN Case Freight forwarder for [employer] Mobile: [phone]   07/07/2022 Assessment:  Continues to have  severe knee pain.  7/10 today.  Continues to do home exercises.  Interventions: reviewed with family and patient concern for swelling to both knees and worsening pain. Recommended ortho follow up to evaluate swelling .  Encouraged patient to continue to take her medications as prescribed.   Plan: Follow up in 1 month for 4th home visit.   Tomasa Rand RN, BSN, Careers information officer for [employer] Mobile: [phone]        Tomasa Rand RN, BSN, Careers information officer for [employer] Mobile: [phone]

## 2022-07-08 ENCOUNTER — Encounter: Payer: Self-pay | Admitting: Internal Medicine

## 2022-07-08 ENCOUNTER — Other Ambulatory Visit: Payer: Self-pay | Admitting: Internal Medicine

## 2022-07-08 DIAGNOSIS — M159 Polyosteoarthritis, unspecified: Secondary | ICD-10-CM

## 2022-08-01 ENCOUNTER — Other Ambulatory Visit: Payer: Self-pay | Admitting: Pulmonary Disease

## 2022-08-03 ENCOUNTER — Other Ambulatory Visit: Payer: Self-pay | Admitting: Occupational Therapy

## 2022-08-03 ENCOUNTER — Encounter (HOSPITAL_COMMUNITY): Payer: Self-pay | Admitting: Occupational Therapy

## 2022-08-03 NOTE — Patient Outreach (Signed)
Aging Gracefully Program  OT Follow-Up Visit  08/03/2022  Wendy Torres 1940/12/24 [id number]  Visit:  3- Third Visit  Start Time:  1100 End Time:  1140 Total Minutes:  40  Readiness to Change Score :  Readiness to Change Score: 10  Durable Medical Equipment: Durable Medical Equipment: Shower Chair With Back Durable Medical Equipment Distribution Date: 08/03/22  Patient Education: Education Provided: Yes Education Details: Shower seat set up to pt's correct height, so when walk in shower is completed it is ready to go. Person(s) Educated: Patient Comprehension: Returned Demonstration  Goals:   Goals Addressed             This Visit's Progress    Patient Stated   On track    She would like to be able to access her shower and feel safe getting in/out of bathroom and on/off toilet. Tub to shower conversion with grab bars, hand held shower and lower holder), shower seat, toilet hands that do not secure via floor (current tripping hazzard due to small bathroom, especially with rollator), perhaps widen bathroom door if able.PARTIALLY MET--shower seat provided and set to client's proper/safe height.  Name: Wendy Torres   Date: 08/03/2022   OT ACTION PLAN: Bathing   Target Problem Area:   Decreased safety while standing in shower    Why Problem May Occur:     Pain from standing too long  Decreased balance in standing   Increased shortness of breath with too much activity  Target Goal(s):   Feeling safe with showering      STRATEGIES    Saving Your Energy DO:  Use a tub seat   Sit to do as much of showering as you can (sitting takes less energy than standing)--only stand when needed  Keep frequently used items within easy reach  Wear oxygen when showering           Modifying your home environment and making it safe     DO:  Install grab bars in the shower (going to be done by [facility]) and next to the toilet (toilet frame grab bars are being installed on toilet)   Place a rubber mat the entire length of the tub OR use adhesive non skid treads  Make sure the bathroom is well-lit    Simplifying the way you set up tasks or daily routines DO:  Plan to bathe/shower before you're overly tired  Gather everything you will need before beginning       PRACTICE   Based on what we have talked about, you are willing to try:   Use shower chair once walk in shower with grab bars and hand held shower has been completed.   If an idea does not work the first time, try it again (and again). We may make some changes over the next few sessions, based on how they work.      Torres Circle, OTR/L      08/03/2022  Occupational Therapist                        Date        Post Clinical Reasoning: Client Action (Goal) One Interventions: Shower seat provided and pt sat on shower seat to make sure height was good for her. She will not use the shower seat until her tub is converted to a walk in shower. Did Client Try?: Yes Targeted Problem Area Status: A Lot Better Clinician View Of Client Situation:: Wendy Torres was having a good day today. She was happy to get the shower seat. Client View Of His/Her Situation:: Wendy Torres is patiently awaiting the start of the rest of the work on her house with looking forward to being able to get in her shower to bath. Next Visit Plan:: Compression sock methods of doffing and donning  Tye [place], [place] Aging Gracefully [phone]

## 2022-08-04 ENCOUNTER — Other Ambulatory Visit: Payer: Self-pay | Admitting: Internal Medicine

## 2022-08-04 ENCOUNTER — Other Ambulatory Visit: Payer: Self-pay

## 2022-08-04 DIAGNOSIS — M159 Polyosteoarthritis, unspecified: Secondary | ICD-10-CM

## 2022-08-04 MED ORDER — TRAMADOL HCL 50 MG PO TABS: 50.0000 mg | ORAL_TABLET | Freq: Four times a day (QID) | ORAL | 2 refills | Status: DC | PRN

## 2022-08-04 NOTE — Telephone Encounter (Signed)
The current script is for total 4 tabs per day  I cannot authorize more than this  I can refer to pain management if pt would want this, thanks

## 2022-08-04 NOTE — Patient Outreach (Signed)
Aging Gracefully Program  RN Visit  08/04/2022  Wendy Torres 1940-06-14 [id number]  Visit:  RN Visit Number: 4- Fourth Visit  RN TIME CALCULATION: Start TIme:  RN Start Time Calculation: 1043 End Time:  RN Stop Time Calculation: 1130 Total Minutes:  RN Time Calculation: 47  Readiness To Change Score:     Universal RN Interventions: Calendar Distribution: Yes Exercise Review: Yes Medications: Yes Medication Changes: Yes Mood: Yes Pain: Yes PCP Advocacy/Support: No Fall Prevention: Yes Incontinence: Yes Clinician View Of Client Situation: Home neat and clean.  Patient forgot her appointment.  C/o left shoulder pain and both knees. Client View Of His/Her Situation: Patient reports that she continues to have significant pain in her knees and shoulders. Reports taking tramdol 2 tablets twice a day. reports no recent falls.   Reports sleeping well.   Reports that she continues to o her home exercises.  Rpeorts BP elevated the last few days.  Reports that she thinks it is related to pain.  Healthcare Provider Communication: Did Surveyor, mining With [employer] Provider?: Yes Method Of Communication: [employer] Provider Response According to RN: Message sen tto MD about Tramadol  Clinician View of Client Situation: Diplomatic Services operational officer Of Client Situation: Home neat and clean.  Patient forgot her appointment.  C/o left shoulder pain and both knees. Client's View of His/Her Situation: Client View Of His/Her Situation: Patient reports that she continues to have significant pain in her knees and shoulders. Reports taking tramdol 2 tablets twice a day. reports no recent falls.   Reports sleeping well.   Reports that she continues to o her home exercises.  Rpeorts BP elevated the last few days.  Reports that she thinks it is related to pain.  Medication Assessment: denies any changes in medications    OT Update: pending home modifications  Session Summary: Doing well. Continues  to have joint pain   Goals Addressed               This Visit's Progress     COMPLETED: Aging Clinical cytogeneticist (pt-stated)        Goal: Patient would like to have decreased pain in her back and knees in the next 120 days.   05/19/2022  Assessment:  Patient reports pain in her lower back and knees. Reports she took a tramadol this am for pain. Reports she takes a tramadol daily for pain.  No current pain when she is sitting.  Reports pain with activity like walking with her walker in the home.  Appears steady on her feet.    Interventions: provided [facility] calendar. Provided my contact information   Brainstorming to review ways to help with pain.  Plan: Next home visit planned for 06/09/2022  CLIENT/RN ACTION PLAN - PAIN  Registered Nurse:  Wendy Torres   Date:  05/19/2022  Client Name:Wendy Torres Client ID:    Target Area:  PAIN   Why Problem May Occur:  arthirits  Target Goal: Patient will report decrease pain in lower back and knees in the next 120 days.  STRATEGIES Coping Strategies Ideas:  Heat  05/19/2022  reviewed Use heating pad or warm towel on painful area no more than 20 minutes at a time. Don't sleep with a heating pad on - it could burn your skin   Ice  05/19/2022  reviewed Use ice pack or frozen bag of vegetables on painful area.   Leave cold pack on for less than 20 minutes. Ice can burn your skin.  Don't leave ice on longer than 20 minutes.   Activity and Exercise  Next home visit Joints get stiff when not in use Aging Gracefully Exercises Walking (inside or outside) [employer]  [employer]ard[employer] Housework:  cooking, cleaning, Building surveyorlaundry Swimming   Listen to Music Listening to music can decrease pain. Turn off the TV and turn on the radio.   Prayer/Meditation Prayer and meditation can decrease pain   Other   Acetaminophen/Tylenol (same medication  05/19/2022  Reviewed medication list  DO NOT take more Acetaminophen than below because it can be bad for your  liver. 500 mg. tablets:  2 tablets every 8 hours, as needed for pain.  Do not take more than 6, (500 mg) tablets every day. 300 mg. tablets:  2 tablets every 6 hours, as needed for pain.  Do not take more than 8 (325 mg) tablets every day. Look for Acetaminophen/Tylenol in other medicines you buy over the counter.   Still in Pain There ae a lot of different kinds of pain medicines:  creams, patches and supplements. Ask your Healthcare Provider about other pain medications.   Stop Smoking  05/19/2022  N/A Smoking can make arthritis worse.   Stop Pain Before it gets bad. Once in pain It is harder to get rid of. Begin pain relief while you have mild pain.   Other   Other   ;  PRACTICE It is important to practice the strategies so we can determine if they will be effective in helping to reach the goal.    Follow these specific recommendations:  Take medications as prescribed.    Be as active as possible and continue your chair exercises.   If strategy does not work the first time, try it again.  We may make some changes over the next few sessions.    We may make some changes over the next few sessions, based on how they work.   Wendy PavyAmanda Norris Brumbach RN, BSN, CEN RN Case Production designer, theatre/television/filmManager for [employer] Gracefully [facility] Mobile: [phone]   06/09/2022 Assessment:  Patient reports 10/10 pain to her right knee today.  Reports tramadol not working.  Reports using ice and heat.  Interventions:  Provided written home exercise plan  and demonstrated how to do exercises. Reviewed concern about knee pain with patient. Reviewed that she can take her tramadol every 6 hours.  Called in refill for patient and she is not able to do this herself. Sent secure message to MD to inform of pain. He suggested patient use OTC Voltaren and follow up with ortho.  I reviewed this with patient and daughter.  Encouraged patient to do her exercises when she can tolerate them.   PLAN: follow up on March 8.     Wendy PavyAmanda Jerri Hargadon RN, BSN, CEN RN Case Production designer, theatre/television/filmManager for [employer] Gracefully [facility] Mobile: [phone]   07/07/2022 Assessment:  Continues to have  severe knee pain.  7/10 today.  Continues to do home exercises.  Interventions: reviewed with family and patient concern for swelling to both knees and worsening pain. Recommended ortho follow up to evaluate swelling .  Encouraged patient to continue to take her medications as prescribed.   Plan: Follow up in 1 month for 4th home visit.   Wendy PavyAmanda Aariyah Sampey RN, BSN, CEN RN Case Production designer, theatre/television/filmManager for [employer] Gracefully [facility] Mobile: [phone]   08/04/2022 Assessment: Pain 5/10 today.  Reports planned OV with ortho next week.  Patient reports that she is using her Voltaren gel.  Patient continues  to do her home exercises. Continues to use an ice pain on her right knee.   Today's Vitals   08/04/22 1104  PainSc: 5      Interventions: sent in basket message to MD about Tramadol.  Sent in basket message about BP.  Reviewed with patient the directions on the tramadol bottle. Reviewed pending appointment with ortho next week.     Plan: Pain is decreased some but continues to have significant joint pain.   Goal met. Reviewed  that patient knows how to reach [employer].  Reviewed plan moving forward with OT.  Wendy PavyAmanda Maecyn Panning RN, BSN, Careers adviserCEN RN Case Manager for [employer] Gracefully [facility] Mobile: [phone]        Wendy PavyAmanda Miran Kautzman RN, BSN, Careers adviserCEN RN Case Manager for [employer] Gracefully [facility] Mobile: [phone]

## 2022-08-04 NOTE — Patient Instructions (Signed)
Visit Information  Thank you for taking time to visit with me today. Please don't hesitate to contact me if I can be of assistance to you before our next scheduled home appointment.  Following are the goals we discussed today.  Goals Addressed               This Visit's Progress     COMPLETED: Aging Clinical cytogeneticistGracefully RN (pt-stated)        Goal: Patient would like to have decreased pain in her back and knees in the next 120 days.   05/19/2022  Assessment:  Patient reports pain in her lower back and knees. Reports she took a tramadol this am for pain. Reports she takes a tramadol daily for pain.  No current pain when she is sitting.  Reports pain with activity like walking with her walker in the home.  Appears steady on her feet.    Interventions: [facility]. Provided my contact information   Brainstorming to review ways to help with pain.  Plan: Next home visit planned for 06/09/2022  CLIENT/RN ACTION PLAN - PAIN  Registered Nurse:  Rowe PavyAmanda Janett Kamath   Date:  05/19/2022  Client Name:Wendy Torres Client ID:    Target Area:  PAIN   Why Problem May Occur:  arthirits  Target Goal: Patient will report decrease pain in lower back and knees in the next 120 days.  STRATEGIES Coping Strategies Ideas:  Heat  05/19/2022  reviewed Use heating pad or warm towel on painful area no more than 20 minutes at a time. Don't sleep with a heating pad on - it could burn your skin   Ice  05/19/2022  reviewed Use ice pack or frozen bag of vegetables on painful area.   Leave cold pack on for less than 20 minutes. Ice can burn your skin.  Don't leave ice on longer than 20 minutes.   Activity and Exercise  Next home visit Joints get stiff when not in use Aging Gracefully Exercises Walking (inside or outside) [employer]  Gardening Housework:  cooking, cleaning, Building surveyorlaundry Swimming   Listen to Music Listening to music can decrease pain. Turn off the TV and turn on the radio.   Prayer/Meditation  Prayer and meditation can decrease pain   Other   Acetaminophen/Tylenol (same medication  05/19/2022  Reviewed medication list  DO NOT take more Acetaminophen than below because it can be bad for your liver. 500 mg. tablets:  2 tablets every 8 hours, as needed for pain.  Do not take more than 6, (500 mg) tablets every day. 300 mg. tablets:  2 tablets every 6 hours, as needed for pain.  Do not take more than 8 (325 mg) tablets every day. Look for Acetaminophen/Tylenol in other medicines you buy over the counter.   Still in Pain There ae a lot of different kinds of pain medicines:  creams, patches and supplements. Ask your Healthcare Provider about other pain medications.   Stop Smoking  05/19/2022  N/A Smoking can make arthritis worse.   Stop Pain Before it gets bad. Once in pain It is harder to get rid of. Begin pain relief while you have mild pain.   Other   Other   ;  PRACTICE It is important to practice the strategies so we can determine if they will be effective in helping to reach the goal.    Follow these specific recommendations:  Take medications as prescribed.    Be as active as possible and continue your chair  exercises.   If strategy does not work the first time, try it again.  We may make some changes over the next few sessions.    We may make some changes over the next few sessions, based on how they work.   Rowe Pavy RN, BSN, CEN RN Case Production designer, theatre/television/film for [employer] [facility] Mobile: [phone]   06/09/2022 Assessment:  Patient reports 10/10 pain to her right knee today.  Reports tramadol not working.  Reports using ice and heat.  Interventions:  Provided written home exercise plan  and demonstrated how to do exercises. Reviewed concern about knee pain with patient. Reviewed that she can take her tramadol every 6 hours.  Called in refill for patient and she is not able to do this herself. Sent secure message to MD to inform of pain. He  suggested patient use OTC Voltaren and follow up with ortho.  I reviewed this with patient and daughter.  Encouraged patient to do her exercises when she can tolerate them.   PLAN: follow up on March 8.    Rowe Pavy RN, BSN, CEN RN Case Production designer, theatre/television/film for [employer] [facility] Mobile: [phone]   07/07/2022 Assessment:  Continues to have  severe knee pain.  7/10 today.  Continues to do home exercises.  Interventions: reviewed with family and patient concern for swelling to both knees and worsening pain. Recommended ortho follow up to evaluate swelling .  Encouraged patient to continue to take her medications as prescribed.   Plan: Follow up in 1 month for 4th home visit.   Rowe Pavy RN, BSN, CEN RN Case Production designer, theatre/television/film for [employer] [facility] Mobile: [phone]   08/04/2022 Assessment: Pain 5/10 today.  Reports planned OV with ortho next week.  Patient reports that she is using her Voltaren gel.  Patient continues to do her home exercises. Continues to use an ice pain on her right knee.   Today's Vitals   08/04/22 1104  PainSc: 5      Interventions: sent in basket message to MD about Tramadol.  Sent in basket message about BP.  Reviewed with patient the directions on the tramadol bottle. Reviewed pending appointment with ortho next week.     Plan: Pain is decreased some but continues to have significant joint pain.   Goal met. Reviewed  that patient knows how to reach [employer].  Reviewed plan moving forward with OT.  Rowe Pavy RN, BSN, CEN RN Case Production designer, theatre/television/film for Aging Gracefully [facility] Mobile: [phone]          If you are experiencing a Mental Health or Behavioral Health Crisis or need someone to talk to, please call the Suicide and Crisis Lifeline: 988 call the [place] National Suicide Prevention Lifeline: (352)820-7307 or TTY: 225-466-0354 TTY 585-565-6044) to talk to a trained counselor call  1-800-273-TALK (toll free, 24 hour hotline) go to [facility] [address], [place] [phone]) call 911   The patient verbalized understanding of instructions, educational materials, and care plan provided today and agreed to receive a mailed copy of patient instructions, educational materials, and care plan.   Rowe Pavy RN, BSN, Careers adviser for [employer] Mobile: [phone]

## 2022-08-04 NOTE — Telephone Encounter (Signed)
Sorry for the confusion.  We need to limit the tramadol to 50 mg qid prn only  I sent a rx to clarify for future  Hopefully she will have pain improvement with ortho eval and tx as well.

## 2022-08-15 DIAGNOSIS — M170 Bilateral primary osteoarthritis of knee: Secondary | ICD-10-CM | POA: Diagnosis not present

## 2022-08-17 ENCOUNTER — Encounter: Payer: Medicare Other | Admitting: Internal Medicine

## 2022-08-18 ENCOUNTER — Other Ambulatory Visit: Payer: Self-pay | Admitting: Internal Medicine

## 2022-08-19 IMAGING — CT CT HEAD W/O CM
4 series · 16 of 47 positions shown, 18 images · non-contrast
Comparison: None.

CLINICAL DATA: Dizziness

EXAM:
CT HEAD WITHOUT CONTRAST
TECHNIQUE: Contiguous axial images were obtained from the base of the skull
through the vertex without intravenous contrast.

[Series 3: head bone · axial · 0.42mm/px · z∈[+1501,+1529]mm · 3 of 73 slices shown]
[im 8/73  bone]
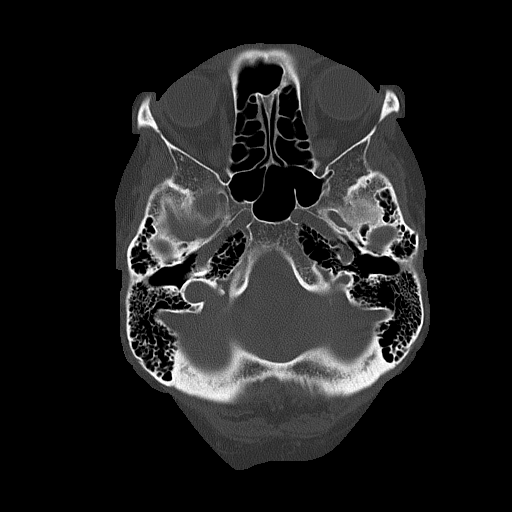
[im 15/73  bone]
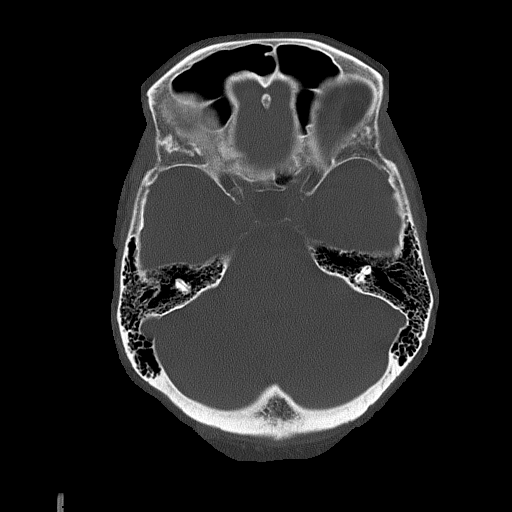
[im 22/73  bone]
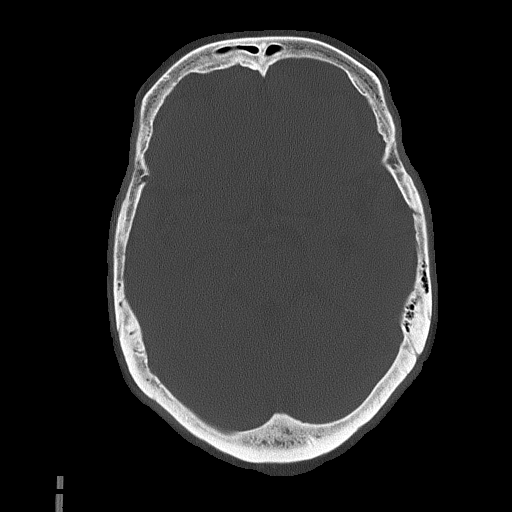

[Series 4: head without · axial · non-contrast · 0.42mm/px · z∈[+1502,+1612]mm · 7 of 30 slices shown, 9 images]
[im 4/30  brain]
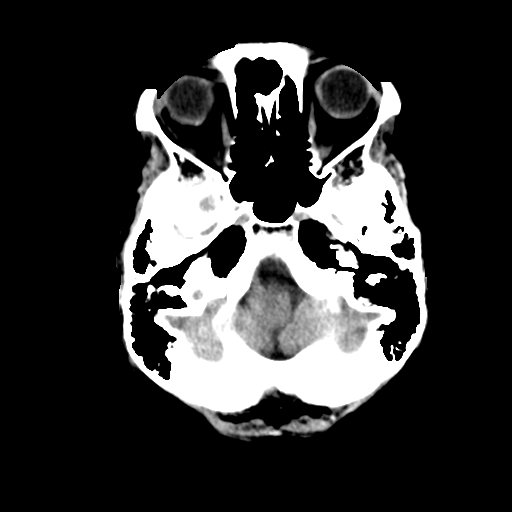
[im 4/30  bone]
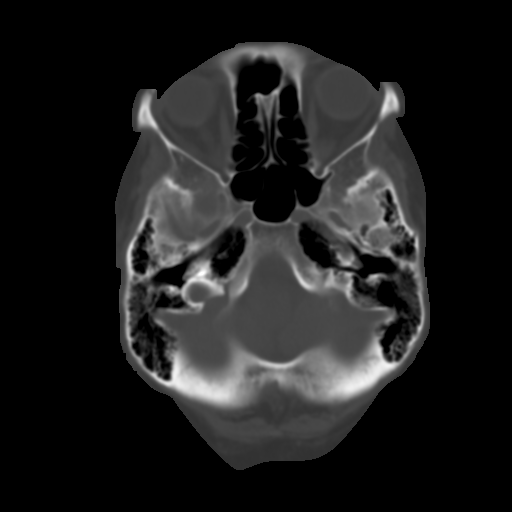
[im 8/30  brain]
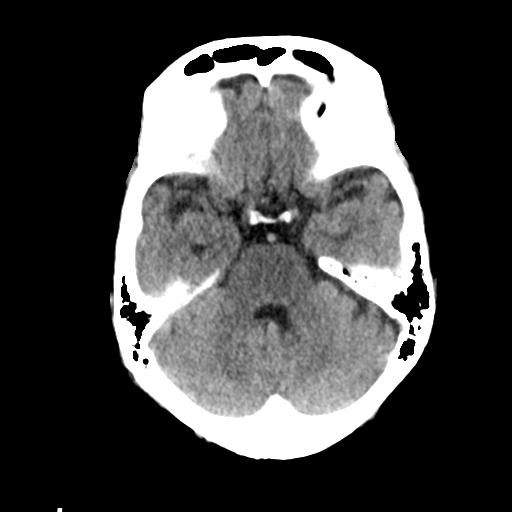
[im 11/30  brain]
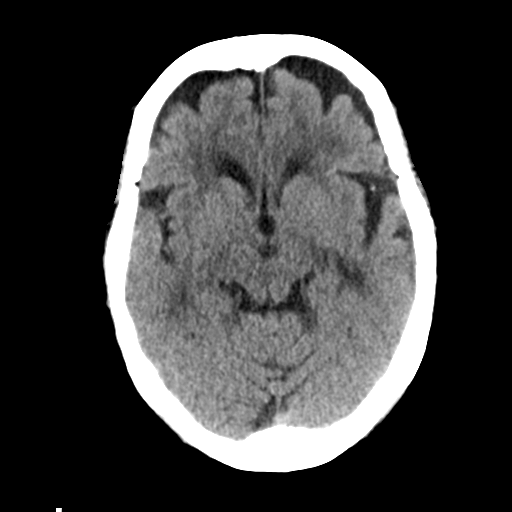
[im 15/30  brain]
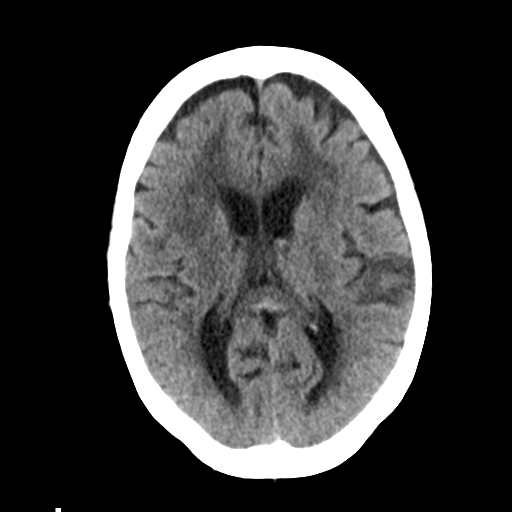
[im 19/30  brain]
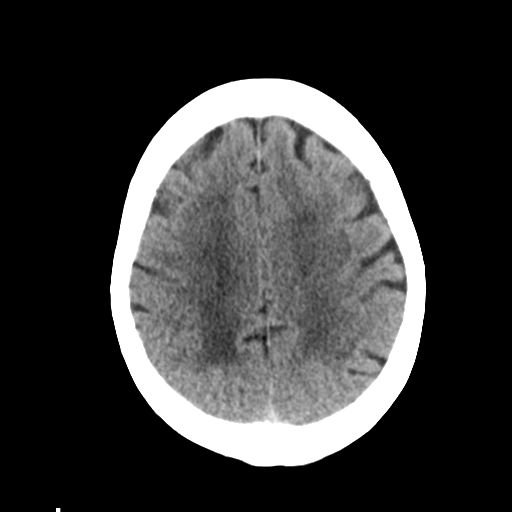
[im 19/30  bone]
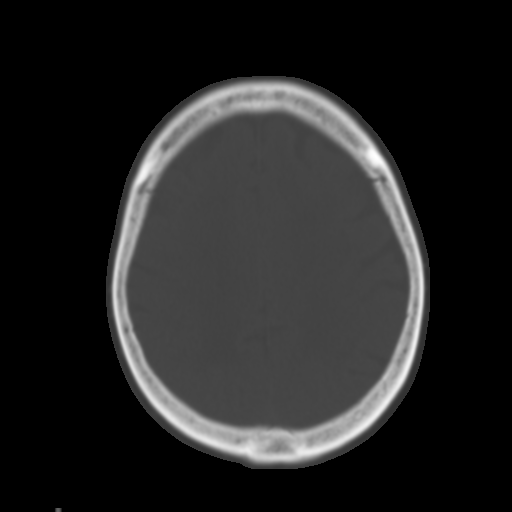
[im 22/30  brain]
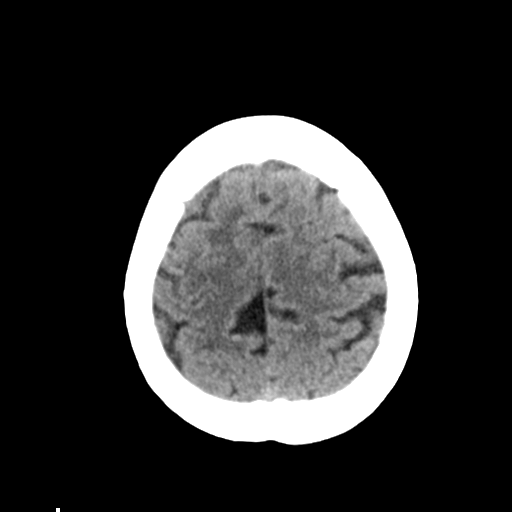
[im 26/30  brain]
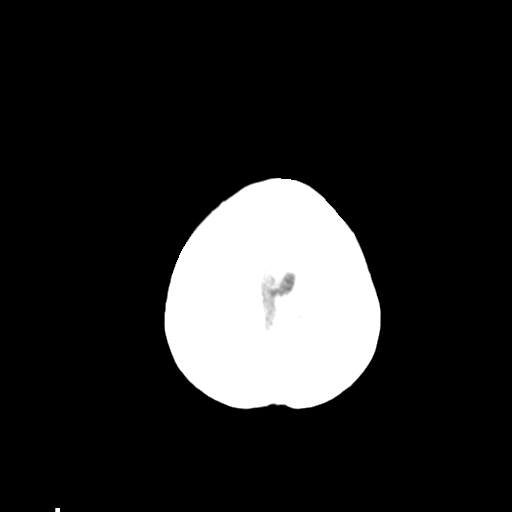

[Series 5: head without cor · coronal · non-contrast · 0.30mm/px · 3 of 73 slices shown]
[im 25/73  brain]
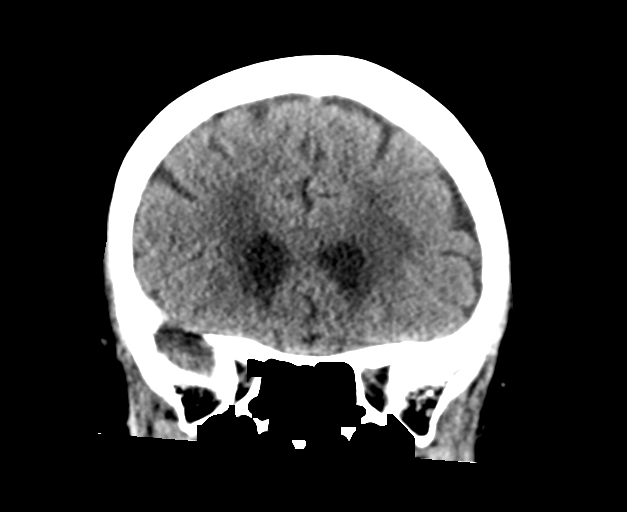
[im 33/73  brain]
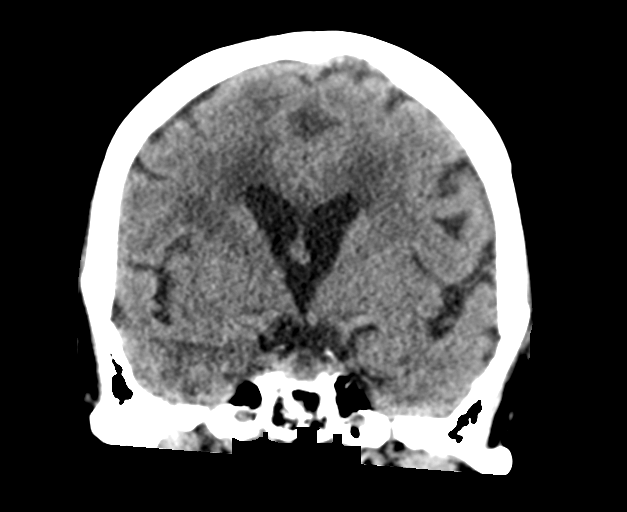
[im 41/73  brain]
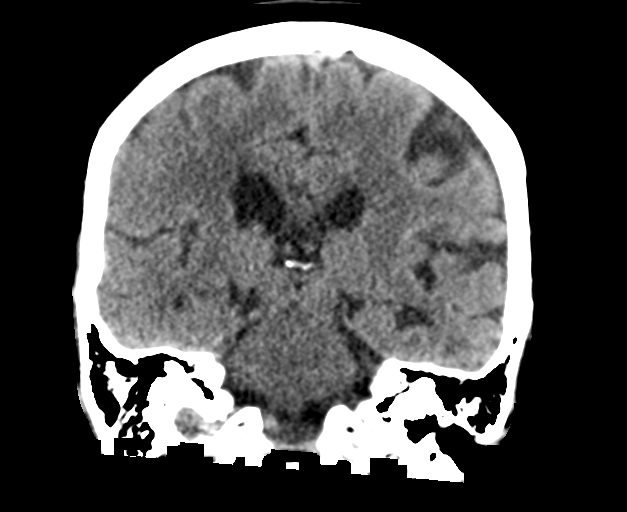

[Series 6: head without sag · sagittal · non-contrast · 0.30mm/px · 3 of 64 slices shown]
[im 22/64  brain]
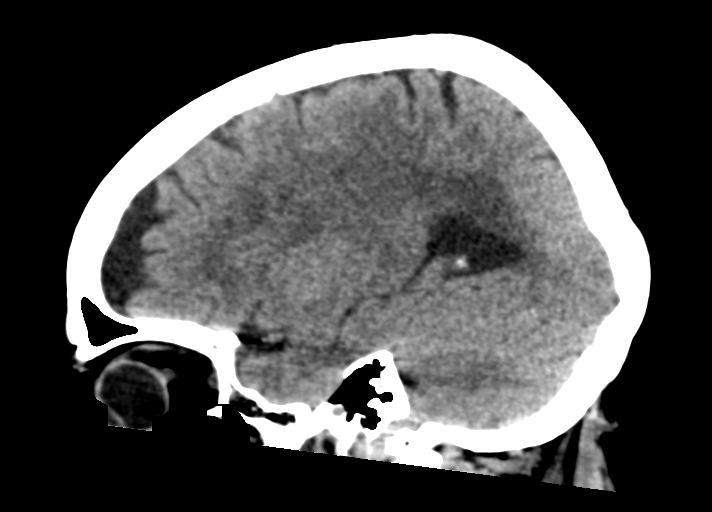
[im 32/64  brain]
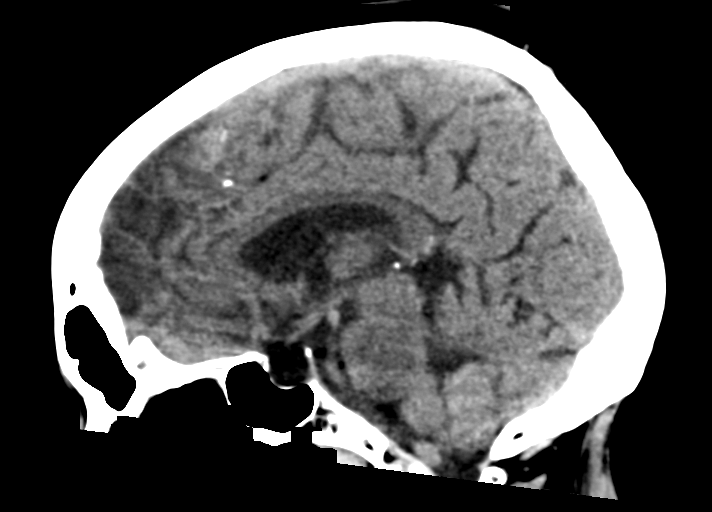
[im 43/64  brain]
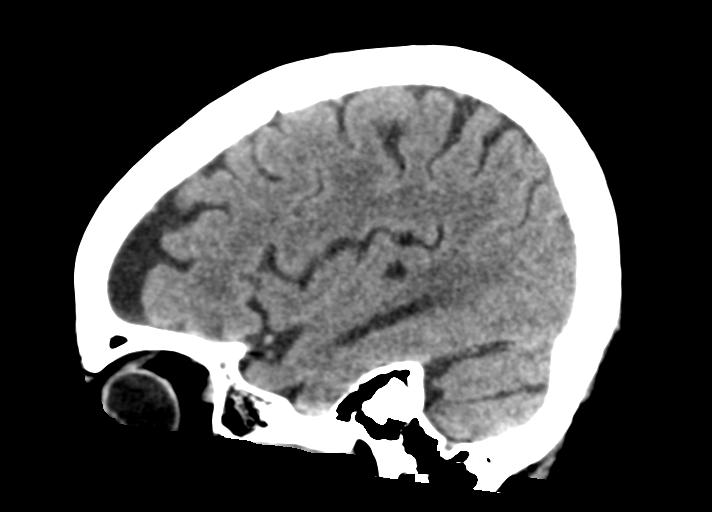

[16 of 47 positions shown; findings below may reference images not displayed]

FINDINGS: Brain: No evidence of acute territorial infarction, hemorrhage,
hydrocephalus,extra-axial collection or mass lesion/mass effect.
There is dilatation the ventricles and sulci consistent with
age-related atrophy. Low-attenuation changes in the deep white
matter consistent with small vessel ischemia.

Vascular: No hyperdense vessel or unexpected calcification.

Skull: The skull is intact. No fracture or focal lesion identified.

Sinuses/Orbits: The visualized paranasal sinuses and mastoid air
cells are clear. The orbits and globes intact.

Other: None
IMPRESSION: No acute intracranial abnormality.

Findings consistent with age related atrophy and chronic small
vessel ischemia

## 2022-08-31 ENCOUNTER — Other Ambulatory Visit: Payer: Self-pay | Admitting: Internal Medicine

## 2022-09-05 ENCOUNTER — Encounter: Payer: Medicare Other | Admitting: Internal Medicine

## 2022-09-08 ENCOUNTER — Encounter: Payer: Self-pay | Admitting: Pulmonary Disease

## 2022-09-08 ENCOUNTER — Ambulatory Visit: Payer: Medicare Other | Admitting: Pulmonary Disease

## 2022-09-08 VITALS — BP 138/82 | HR 74 | Ht 64.0 in | Wt 209.6 lb

## 2022-09-08 DIAGNOSIS — J439 Emphysema, unspecified: Secondary | ICD-10-CM

## 2022-09-08 DIAGNOSIS — J4489 Other specified chronic obstructive pulmonary disease: Secondary | ICD-10-CM | POA: Diagnosis not present

## 2022-09-08 NOTE — Patient Instructions (Signed)
Will increase your oxygen order to 4 L/min and order bigger tanks for transportation Continue the Trelegy inhaler Order echocardiogram for evaluation of pulm hypertension Follow-up in 6 months

## 2022-09-08 NOTE — Progress Notes (Signed)
Wendy Torres    [id number]    [birth year]  Primary Care Physician:John, Len Blalock, MD  Referring Physician: Corwin Levins, MD [address] [place],  [place] [address]  Chief complaint: Follow-up for COPD  HPI: 82 y.o.  with history of emphysema, hypertension, diastolic heart failure.  Referred here for evaluation of emphysema Complaints of dyspnea with activity, no symptoms at rest.  Denies any cough, sputum production, wheezing She was hospitalized in April 2018 for COPD exacerbation, CHF exacerbation and has been on supplemental oxygen since then Symbicort changed to [facility] in 2019 and she is doing well with this change.  Then she got back to Symbicort due to insurance reasons  Pets: No pets  Occupation: Homemaker Exposures: No known exposures.  Reports a mildew problem in the basement but she hardly goes there Smoking history: 25-pack-year smoker.  Quit in 1982 Travel history: No significant travel Relevant family history: Father had emphysema  Interim history: Started on Trelegy at last visit and she is doing better with this Continues on supplemental oxygen.  Outpatient Encounter Medications as of 09/08/2022  Medication Sig   acetaminophen (TYLENOL) 325 MG tablet Take 2 tablets (650 mg total) by mouth every 6 (six) hours as needed for mild pain (or Fever >/= 101).   albuterol (VENTOLIN HFA) 108 (90 Base) MCG/ACT inhaler INHALE 2 PUFFS INTO THE LUNGS EVERY 4 HOURS AS NEEDED FOR WHEEZING OR SHORTNESS OF BREATH.   ALPRAZolam (XANAX) 0.25 MG tablet TAKE 1 TABLET BY MOUTH 2 TIMES DAILY AS NEEDED.   amLODipine-benazepril (LOTREL) 10-40 MG capsule TAKE 1 CAPSULE BY MOUTH DAILY   aspirin EC 81 MG tablet Take 81 mg by mouth daily. Swallow whole.   cloNIDine (CATAPRES) 0.1 MG tablet TAKE 1 TABLET BY MOUTH TWICE A DAY   ferrous sulfate 325 (65 FE) MG EC tablet Take 325 mg by mouth daily with breakfast.   Fexofenadine HCl (MUCINEX ALLERGY PO) Take by mouth as needed.    fluocinolone (SYNALAR) 0.01 % external solution APPLY TOPICALLY 2 TIMES DAILY AS NEEDED.   Fluticasone-Umeclidin-Vilant (TRELEGY ELLIPTA) 200-62.5-25 MCG/ACT AEPB Inhale 1 puff into the lungs daily.   Fluticasone-Umeclidin-Vilant (TRELEGY ELLIPTA) 200-62.5-25 MCG/ACT AEPB Inhale 1 puff into the lungs daily.   Fluticasone-Umeclidin-Vilant (TRELEGY ELLIPTA) 200-62.5-25 MCG/ACT AEPB Inhale 1 puff into the lungs daily at 6 (six) AM.   Fluticasone-Umeclidin-Vilant (TRELEGY ELLIPTA) 200-62.5-25 MCG/ACT AEPB TAKE 1 PUFF BY MOUTH EVERY DAY   furosemide (LASIX) 20 MG tablet TAKE 1 TABLET BY MOUTH IN THE  MORNING   gabapentin (NEURONTIN) 300 MG capsule TAKE 2 CAPSULE BY MOUTH 3 TIMES  DAILY   hydrALAZINE (APRESOLINE) 100 MG tablet Take 50 mg by mouth 3 (three) times daily.   lovastatin (MEVACOR) 40 MG tablet TAKE 1 TABLET BY MOUTH AT  BEDTIME   meclizine (ANTIVERT) 12.5 MG tablet TAKE 1 TABLET BY MOUTH 3 TIMES A DAY AS NEEDED FOR DIZZINESS   nystatin (MYCOSTATIN) 100000 UNIT/ML suspension SWISH AND SPIT TWO TIMES DAILY AFTER USING SYMBICORT.   oxybutynin (DITROPAN) 5 MG tablet TAKE 1 TABLET BY MOUTH 3  TIMES DAILY   pantoprazole (PROTONIX) 40 MG tablet TAKE 1 TABLET BY MOUTH DAILY   predniSONE (DELTASONE) 20 MG tablet Take 2 tablets (40 mg total) by mouth daily with breakfast.   Spacer/Aero-Holding Rudean Curt Use as directed with inhaler.   spironolactone (ALDACTONE) 25 MG tablet Take 25 mg by mouth daily.   sucralfate (CARAFATE) 1 g  tablet Take 1 tablet (1 g total) by mouth 4 (four) times daily -  with meals and at bedtime.   tiZANidine (ZANAFLEX) 4 MG tablet TAKE 1 TABLET BY MOUTH EVERY 6 HOURS AS NEEDED FOR MUSCLE SPASMS.   traMADol (ULTRAM) 50 MG tablet Take 1 tablet (50 mg total) by mouth every 6 (six) hours as needed. for pain   traZODone (DESYREL) 50 MG tablet TAKE 1/2 TO 1 TABLET BY MOUTH AT BEDTIME AS NEEDED FOR SLEEP   triamcinolone cream (KENALOG) 0.1 % APPLY TO AFFECTED AREA TWICE A DAY    No facility-administered encounter medications on file as of 09/08/2022.   Physical Exam: Blood pressure 138/82, pulse 74, height 5\' 4"  (1.626 m), weight 209 lb 9.6 oz (95.1 kg). Gen:      No acute distress HEENT:  EOMI, sclera anicteric Neck:     No masses; no thyromegaly Lungs:    Clear to auscultation bilaterally; normal respiratory effort CV:         Regular rate and rhythm; no murmurs Abd:      + bowel sounds; soft, non-tender; no palpable masses, no distension Ext:    No edema; adequate peripheral perfusion Skin:      Warm and dry; no rash Neuro: alert and oriented x 3 Psych: normal mood and affect   Data Reviewed: Imaging CT chest 10/17/2008- centrilobular emphysema in the upper lobes, 2 mm nodule in the right lung.  Chest x-ray 06/12/2021-no active cardiopulmonary disease I have reviewed the images personally  PFTs  12/21/2017 FVC 1.75 [9%), FEV1 1.10 [73%], F/F 63, TLC 94%, DLCO 41% Moderate obstruction, severe diffusion defect.  6-minute walk test 02/04/2018 96 m, Patient had to stop for desats.  Titrated to 3 L oxygen  Labs CBC 02/04/2018-WBC 7.4, eos 2.5%, absolute eosinophil count 185 Alpha-1 antitrypsin 12/05/2017-159, PI MM IgE 12/05/2017-224  Cardiac: Echocardiogram 01/24/2021 LVEF 60 to 65%, mild concentric LVH Severely elevated PA systolic pressure, RVSP 66  Sleep: Sleep study 04/28/2021 No OSA, AHI 0.  Noted oxygen desaturation.  Assessment:  COPD CT scan from 2010 reviewed which shows emphysematous changes in the upper lobes.   PFTs reviewed which show moderate obstruction with no significant bronchodilator response.  Doing better on Trelegy and will continue the same  Continue supplemental oxygen with exercise and at night as she has pulmonary hypertension and desats on exertion on prior tests though sleep study did not show low oxygen levels.  Pulmonary hypertension Likely secondary to group 3 disease with COPD.  No evidence of OSA on sleep  study. Repeat echocardiogram this year to reassess  Health maintenance 12/03/2013-Prevnar 12/08/2018-Pneumovax  Plan/Recommendations: - Continue Trelegy - Continue supplemental oxygen - Echocardiogram  Follow-up in 6 months  Chilton Greathouse MD [facility] 09/08/2022, 1:30 PM  CC: Corwin Levins, MD

## 2022-09-08 NOTE — Addendum Note (Signed)
Addended by: Bradd Canary L on: 09/08/2022 02:34 PM   Modules accepted: Orders

## 2022-09-08 NOTE — Addendum Note (Signed)
Addended by: Bradd Canary L on: 09/08/2022 03:48 PM   Modules accepted: Orders

## 2022-09-18 ENCOUNTER — Encounter: Payer: Self-pay | Admitting: Internal Medicine

## 2022-09-18 ENCOUNTER — Other Ambulatory Visit: Payer: Self-pay | Admitting: Internal Medicine

## 2022-09-18 DIAGNOSIS — M159 Polyosteoarthritis, unspecified: Secondary | ICD-10-CM

## 2022-09-18 MED ORDER — TRAMADOL HCL 50 MG PO TABS: 50.0000 mg | ORAL_TABLET | Freq: Four times a day (QID) | ORAL | 2 refills | Status: DC | PRN

## 2022-09-18 MED ORDER — GABAPENTIN 300 MG PO CAPS: ORAL_CAPSULE | ORAL | 1 refills | Status: DC

## 2022-09-18 NOTE — Telephone Encounter (Signed)
Ok done erx 

## 2022-09-22 DIAGNOSIS — N183 Chronic kidney disease, stage 3 unspecified: Secondary | ICD-10-CM | POA: Diagnosis not present

## 2022-09-25 ENCOUNTER — Other Ambulatory Visit: Payer: Self-pay | Admitting: Internal Medicine

## 2022-09-26 ENCOUNTER — Other Ambulatory Visit: Payer: Self-pay | Admitting: Occupational Therapy

## 2022-09-26 ENCOUNTER — Other Ambulatory Visit: Payer: Self-pay

## 2022-09-27 DIAGNOSIS — N183 Chronic kidney disease, stage 3 unspecified: Secondary | ICD-10-CM | POA: Diagnosis not present

## 2022-09-27 DIAGNOSIS — E871 Hypo-osmolality and hyponatremia: Secondary | ICD-10-CM | POA: Diagnosis not present

## 2022-09-27 DIAGNOSIS — D631 Anemia in chronic kidney disease: Secondary | ICD-10-CM | POA: Diagnosis not present

## 2022-09-27 DIAGNOSIS — I129 Hypertensive chronic kidney disease with stage 1 through stage 4 chronic kidney disease, or unspecified chronic kidney disease: Secondary | ICD-10-CM | POA: Diagnosis not present

## 2022-09-27 DIAGNOSIS — N2581 Secondary hyperparathyroidism of renal origin: Secondary | ICD-10-CM | POA: Diagnosis not present

## 2022-09-27 NOTE — Patient Outreach (Signed)
Aging Gracefully Program  OT Follow-Up Visit  09/27/2022  Wendy Torres Aug 15, 1940 [id number]  Visit:  4- Fourth Visit  Start Time:  1300 End Time:  1400 Total Minutes:  60  Readiness to Change Score :  Readiness to Change Score: 10  Patient Education: Education Provided: Yes Education Details: On different ways for her to get her Ted hose on. She has difficulty with this due to decreased AROM In her shoulders even with AE. Person(s) Educated: Patient Comprehension: Need Further Instruction  Goals:   Goals Addressed             This Visit's Progress    Patient Stated       She would like to be able to access her shower and feel safe getting in/out of bathroom and on/off toilet. Tub to shower conversion with grab bars, hand held shower and lower holder), shower seat, toilet hands that do not secure via floor (current tripping hazzard due to small bathroom, especially with rollator), perhaps widen bathroom door if able.PARTIALLY MET--shower seat provided and set to client's proper/safe height. 09/26/2022 walk in shower with inside grab bar and one outside grab bar, as well as a handheld shower have been installed as well as the rails on her toilet. She is still waiting on the lower holder for handheld shower head and it has been decided that she would benefit from 2 other grab bars in her bathroom that has been communicated to [facility].  Name: Wendy Torres   Date: 08/03/2022 and 09/26/2022   OT ACTION PLAN: Bathing   Target Problem Area:   Decreased safety while standing in shower    Why Problem May Occur:     Pain from standing too long  Decreased balance in standing   Increased shortness of breath with too much activity  Target Goal(s):   Feeling safe with showering      STRATEGIES    Saving Your Energy DO:  Use a tub seat   Sit to do as much of showering as you can (sitting takes less energy than standing)--only stand when needed  Keep frequently used items within easy  reach  Wear oxygen when showering           Modifying your home environment and making it safe     DO:  Install grab bars in the shower (going to be done by [facility]) and next to the toilet (toilet frame grab bars are being installed on toilet)  Place a rubber mat the entire length of the tub OR use adhesive non skid treads  Make sure the bathroom is well-lit    Simplifying the way you set up tasks or daily routines DO:  Plan to bathe/shower before you're overly tired  Gather everything you will need before beginning       PRACTICE   Based on what we have talked about, you are willing to try:   Use shower chair once walk in shower with grab bars and hand held shower has been completed.   If an idea does not work the first time, try it again (and again). We may make some changes over the next few sessions, based on how they work.      Ignacia Palma, OTR/L      08/03/2022  Occupational Therapist                        Date     COMPLETED: Patient Stated  Safety getting in and out of her house. Permanent ramp with door threshold made show no hump to go over with rollator or wheelchair. MET and pt is happy with the access she now has.     Patient Stated   On track    Would like to be able to put on and take off compression socks easier (look into strategies for donning, dressing stick for doffing. 09/26/2022 in process of problem solving on what really works the best for her.        Post Clinical Reasoning: Client Action (Goal) One Interventions: walk in shower has been completed except for the lower holder for hand held shower head. Did Client Try?: Yes Targeted Problem Area Status: A Lot Better Client Action (Goal) Two Interventions: Pt shown different techniques for donning her ted hose, which did not really work for her due to decreased AROM shoulders and pain in shoulders. She did have a sock aid she has been using intermittently to get the started, but it is has been  a well used sock aid with a crack at the top that she has covered with duct tape and in the process of seeing how she uses it and myself trying another way to use it (ted hose partially turned down) the sockaid cracked even further. Did Client Try?: Yes Targeted Problem Area Status: A Lot Worse Clinician View Of Client Situation:: Ms Micheletti was resitng sitting with her legs up on her ottoman when I arrived. She worked with me on problem solving with ted hose and showed me how she will now get into the bathroom. Still cannot take rollator into bathroom due to doorway too small. In observing this with her it was determined that 2 more grab bars would be benefical for her to get into and out of her bathroom as well as to the toilet and walk in shower. She practiced getting into and out of her new shower stall to shower seat and we worked with her hand held shower head and adjusting the water. Client View Of His/Her Situation:: She is thankful for what has been done. Next Visit Plan:: Continue to work on methods to [employer] and [employer], OT Aging Gracefully [phone]

## 2022-10-04 ENCOUNTER — Other Ambulatory Visit: Payer: Self-pay | Admitting: Internal Medicine

## 2022-10-06 ENCOUNTER — Encounter: Payer: Medicare Other | Admitting: Internal Medicine

## 2022-10-26 ENCOUNTER — Other Ambulatory Visit: Payer: Self-pay | Admitting: Occupational Therapy

## 2022-10-26 NOTE — Patient Outreach (Signed)
Aging Gracefully Program  OT FINAL Visit  10/26/2022  ASPIN PALOMAREZ 08/30/1940 [id number]  Visit:  5- Fifth Visit  Start Time:  1300 End Time:  1345 Total Minutes:  45  Readiness to Change:  Readiness to Change Score: 10  Durable Medical Equipment: Adaptive Equipment: Sock Aid Adaptive Equipment Distribution Date: 10/26/22  Patient Education: Education Details: Brought her a new sock aid to try and she was able to get her compression socks on with relative ease. Person(s) Educated: Patient Comprehension: Returned Demonstration  Goals:  Goals Addressed             This Visit's Progress    COMPLETED: Patient Stated       She would like to be able to access her shower and feel safe getting in/out of bathroom and on/off toilet. Tub to shower conversion with grab bars, hand held shower and lower holder), shower seat, toilet hands that do not secure via floor (current tripping hazzard due to small bathroom, especially with rollator), perhaps widen bathroom door if able.PARTIALLY MET--shower seat provided and set to client's proper/safe height. 09/26/2022 walk in shower with inside grab bar and one outside grab bar, as well as a handheld shower have been installed as well as the rails on her toilet. She is still waiting on the lower holder for handheld shower head and it has been decided that she would benefit from 2 other grab bars in her bathroom that has been communicated to [facility]. MET with some tweaks needing to be made--one extra handrail put in on right as you enter but needs to be moved; could not put another handrail in on the left due to metal pieces in wall per client that screws could not go into; threshold ramp added at bathroom door but still not safe (will send email to [facility] about this)  Name: Shresta Risden   Date: 08/03/2022 and 09/26/2022   OT ACTION PLAN: Bathing   Target Problem Area:   Decreased safety while standing in shower    Why Problem May Occur:     Pain from  standing too long  Decreased balance in standing   Increased shortness of breath with too much activity  Target Goal(s):   Feeling safe with showering      STRATEGIES    Saving Your Energy DO:  Use a tub seat   Sit to do as much of showering as you can (sitting takes less energy than standing)--only stand when needed  Keep frequently used items within easy reach  Wear oxygen when showering           Modifying your home environment and making it safe     DO:  Install grab bars in the shower (going to be done by [facility]) and next to the toilet (toilet frame grab bars are being installed on toilet)  Place a rubber mat the entire length of the tub OR use adhesive non skid treads  Make sure the bathroom is well-lit    Simplifying the way you set up tasks or daily routines DO:  Plan to bathe/shower before you're overly tired  Gather everything you will need before beginning       PRACTICE   Based on what we have talked about, you are willing to try:   Use shower chair once walk in shower with grab bars and hand held shower has been completed.   If an idea does not work the first time, try it again (and again). We may  make some changes over the next few sessions, based on how they work.      Ignacia Palma, OTR/L      08/03/2022  Occupational Therapist                        Date     COMPLETED: Patient Stated       Would like to be able to put on and take off compression socks easier (look into strategies for donning, dressing stick for doffing. 09/26/2022 in process of problem solving on what really works the best for her. MET: 10/26/2022  Name: Ahsley Attwood       Date: 10/26/2022   OT ACTION PLAN: Dressing   Target Problem Area:   Getting compression socks on and off    Why Problem May Occur:     Decreased active range of motion in left shoulder Decreased strength left shoulder  Decreased strength overall in arms Tightness of compression socks Difficulty bending  forward and bringing legs up to her      Target Goal(s):   Increased independence with putting on and taking off compression socks      STRATEGIES   Saving Your Energy DO:  Sit down to do tasks-   Use appropriate adaptive equipment (circle appropriate equipment): long-handled shoe horn, sock aide, dressing stick, reacher     Keep all items you'll need within easy reach    PRACTICE   Based on what we have talked about, you are willing to try:   To use the sock aid and it if is still a struggle have family assist you as needed    If an idea does not work the first time, try it again (and again).   Ignacia Palma, Mitchel Honour       10/26/2022 Occupational Therapist             Date        Post Clinical Reasoning: Client Action (Goal) Two Interventions: Client tried new sock aid for compression socks and it worked well for her Did Client Try?: Yes Targeted Problem Area Status: A Lot Better Client View Of His/Her Situation:: Ms. Griffith continues to be very thankful for what has been done for her at her home. Next Visit Plan:: This was the last OT visit. It has been a pleasure getting to know and working with Ms. Desautel--it is alwasy bitter sweet saying good-bye.

## 2022-11-01 ENCOUNTER — Telehealth: Payer: Self-pay | Admitting: Pulmonary Disease

## 2022-11-01 DIAGNOSIS — J9611 Chronic respiratory failure with hypoxia: Secondary | ICD-10-CM

## 2022-11-01 NOTE — Telephone Encounter (Signed)
Pt. Daughter calling DME company coming to home today to check O2 Concentrator because not working properly but adapt said it would be better it get new script so the concentrator could go up to the10L but pt. Would till be on 4L please advise pt

## 2022-11-02 ENCOUNTER — Other Ambulatory Visit: Payer: Self-pay | Admitting: Pulmonary Disease

## 2022-11-02 ENCOUNTER — Other Ambulatory Visit: Payer: Self-pay | Admitting: Internal Medicine

## 2022-11-03 NOTE — Telephone Encounter (Signed)
PM, please advise if you are okay with this.

## 2022-11-09 ENCOUNTER — Ambulatory Visit (INDEPENDENT_AMBULATORY_CARE_PROVIDER_SITE_OTHER): Payer: Medicare Other | Admitting: Internal Medicine

## 2022-11-09 ENCOUNTER — Encounter: Payer: Self-pay | Admitting: Internal Medicine

## 2022-11-09 VITALS — BP 130/82 | HR 70 | Temp 99.0°F | Ht 64.0 in

## 2022-11-09 DIAGNOSIS — E538 Deficiency of other specified B group vitamins: Secondary | ICD-10-CM

## 2022-11-09 DIAGNOSIS — E559 Vitamin D deficiency, unspecified: Secondary | ICD-10-CM

## 2022-11-09 DIAGNOSIS — Z0001 Encounter for general adult medical examination with abnormal findings: Secondary | ICD-10-CM | POA: Diagnosis not present

## 2022-11-09 DIAGNOSIS — H9193 Unspecified hearing loss, bilateral: Secondary | ICD-10-CM

## 2022-11-09 DIAGNOSIS — G894 Chronic pain syndrome: Secondary | ICD-10-CM | POA: Diagnosis not present

## 2022-11-09 DIAGNOSIS — E785 Hyperlipidemia, unspecified: Secondary | ICD-10-CM | POA: Diagnosis not present

## 2022-11-09 DIAGNOSIS — I5032 Chronic diastolic (congestive) heart failure: Secondary | ICD-10-CM | POA: Diagnosis not present

## 2022-11-09 DIAGNOSIS — R7303 Prediabetes: Secondary | ICD-10-CM

## 2022-11-09 DIAGNOSIS — D509 Iron deficiency anemia, unspecified: Secondary | ICD-10-CM

## 2022-11-09 DIAGNOSIS — I10 Essential (primary) hypertension: Secondary | ICD-10-CM | POA: Diagnosis not present

## 2022-11-09 DIAGNOSIS — N3281 Overactive bladder: Secondary | ICD-10-CM | POA: Diagnosis not present

## 2022-11-09 DIAGNOSIS — J9611 Chronic respiratory failure with hypoxia: Secondary | ICD-10-CM | POA: Diagnosis not present

## 2022-11-09 LAB — HEPATIC FUNCTION PANEL
ALT: 10 U/L (ref 0–35)
AST: 16 U/L (ref 0–37)
Albumin: 4.3 g/dL (ref 3.5–5.2)
Alkaline Phosphatase: 74 U/L (ref 39–117)
Bilirubin, Direct: 0.1 mg/dL (ref 0.0–0.3)
Total Bilirubin: 0.2 mg/dL (ref 0.2–1.2)
Total Protein: 8.5 g/dL — ABNORMAL HIGH (ref 6.0–8.3)

## 2022-11-09 LAB — CBC WITH DIFFERENTIAL/PLATELET
Basophils Absolute: 0.1 10*3/uL (ref 0.0–0.1)
Basophils Relative: 0.9 % (ref 0.0–3.0)
Eosinophils Absolute: 0.2 10*3/uL (ref 0.0–0.7)
Eosinophils Relative: 2.3 % (ref 0.0–5.0)
HCT: 34 % — ABNORMAL LOW (ref 36.0–46.0)
Hemoglobin: 10.6 g/dL — ABNORMAL LOW (ref 12.0–15.0)
Lymphocytes Relative: 24.1 % (ref 12.0–46.0)
Lymphs Abs: 1.7 10*3/uL (ref 0.7–4.0)
MCHC: 31.2 g/dL (ref 30.0–36.0)
MCV: 83.3 fl (ref 78.0–100.0)
Monocytes Absolute: 0.8 10*3/uL (ref 0.1–1.0)
Monocytes Relative: 11.9 % (ref 3.0–12.0)
Neutro Abs: 4.3 10*3/uL (ref 1.4–7.7)
Neutrophils Relative %: 60.8 % (ref 43.0–77.0)
Platelets: 366 10*3/uL (ref 150.0–400.0)
RBC: 4.08 Mil/uL (ref 3.87–5.11)
RDW: 15.6 % — ABNORMAL HIGH (ref 11.5–15.5)
WBC: 7 10*3/uL (ref 4.0–10.5)

## 2022-11-09 LAB — LIPID PANEL
Cholesterol: 167 mg/dL (ref 0–200)
HDL: 63.1 mg/dL (ref >39.00)
LDL Cholesterol: 89 mg/dL (ref 0–99)
NonHDL: 103.54
Total CHOL/HDL Ratio: 3
Triglycerides: 73 mg/dL (ref 0.0–149.0)
VLDL: 14.6 mg/dL (ref 0.0–40.0)

## 2022-11-09 LAB — IBC PANEL
Iron: 34 ug/dL — ABNORMAL LOW (ref 42–145)
Saturation Ratios: 8.4 % — ABNORMAL LOW (ref 20.0–50.0)
TIBC: 403.2 ug/dL (ref 250.0–450.0)
Transferrin: 288 mg/dL (ref 212.0–360.0)

## 2022-11-09 LAB — BASIC METABOLIC PANEL
BUN: 11 mg/dL (ref 6–23)
CO2: 33 mEq/L — ABNORMAL HIGH (ref 19–32)
Calcium: 11.3 mg/dL — ABNORMAL HIGH (ref 8.4–10.5)
Chloride: 98 mEq/L (ref 96–112)
Creatinine, Ser: 0.98 mg/dL (ref 0.40–1.20)
GFR: 53.27 mL/min — ABNORMAL LOW (ref >60.00)
Glucose, Bld: 98 mg/dL (ref 70–99)
Potassium: 4.3 mEq/L (ref 3.5–5.1)
Sodium: 136 mEq/L (ref 135–145)

## 2022-11-09 LAB — MICROALBUMIN / CREATININE URINE RATIO
Creatinine,U: 14.7 mg/dL
Microalb Creat Ratio: 4.8 mg/g (ref 0.0–30.0)
Microalb, Ur: 0.7 mg/dL (ref 0.0–1.9)

## 2022-11-09 LAB — FERRITIN: Ferritin: 13.9 ng/mL (ref 10.0–291.0)

## 2022-11-09 LAB — HEMOGLOBIN A1C: Hgb A1c MFr Bld: 5.7 % (ref 4.6–6.5)

## 2022-11-09 LAB — VITAMIN B12: Vitamin B-12: 299 pg/mL (ref 211–911)

## 2022-11-09 LAB — VITAMIN D 25 HYDROXY (VIT D DEFICIENCY, FRACTURES): VITD: 24.11 ng/mL — ABNORMAL LOW (ref 30.00–100.00)

## 2022-11-09 LAB — TSH: TSH: 0.85 u[IU]/mL (ref 0.35–5.50)

## 2022-11-09 MED ORDER — NYSTATIN 100000 UNIT/ML MT SUSP: OROMUCOSAL | 5 refills | Status: AC

## 2022-11-09 MED ORDER — SOLIFENACIN SUCCINATE 5 MG PO TABS: 5.0000 mg | ORAL_TABLET | Freq: Every day | ORAL | 3 refills | Status: DC

## 2022-11-09 NOTE — Patient Instructions (Signed)
Ok to change the oxybutinin to Vesicare 5 mg per day for bladder to hopefully then have less dry mouth  Please continue all other medications as before, and refills have been done if requested - the nystatin  Please have the pharmacy call with any other refills you may need.  Please continue your efforts at being more active, low cholesterol diet, and weight control.  You are otherwise up to date with prevention measures today.  Please keep your appointments with your specialists as you may have planned  You will be contacted regarding the referral for: Audiology (hearing testing)  Please go to the LAB at the blood drawing area for the tests to be done  You will be contacted by phone if any changes need to be made immediately.  Otherwise, you will receive a letter about your results with an explanation, but please check with MyChart first.  Please remember to sign up for MyChart if you have not done so, as this will be important to you in the future with finding out test results, communicating by private email, and scheduling acute appointments online when needed.  Please make an Appointment to return in 4 months, or sooner if needed

## 2022-11-09 NOTE — Progress Notes (Signed)
Patient ID: Wendy Torres, female   DOB: 10/23/1940, 82 y.o.   MRN: [id number]         Chief Complaint:: wellness exam and numerous issues including thrush, OAB, DOE, LUE pain, right ulnar neuritis, bilateral hearing worse       HPI:  Wendy Torres is a 82 y.o. female here for wellness exam; for tdap and shingrix at pharmacy, o/w up to date                       Also has recurring thrush to tongue with dry mouth on oxybutinin, ongoing mild doe, left upper arm and neck pain  mild intermittent, and bilateral hearing worsening last 6 months.  Pt denies chest pain, increased sob or doe, wheezing, orthopnea, PND, increased LE swelling, palpitations, dizziness or syncope.   Pt denies polydipsia, polyuria, or new focal neuro s/s.   Pt denies fever, wt loss, night sweats, loss of appetite, or other constitutional symptoms  now on home o2 3-4L   Wt Readings from Last 3 Encounters:  09/08/22 209 lb 9.6 oz (95.1 kg)  06/28/22 202 lb (91.6 kg)  03/08/22 207 lb (93.9 kg)   BP Readings from Last 3 Encounters:  11/09/22 130/82  09/08/22 138/82  06/28/22 (!) 142/60   Immunization History  Administered Date(s) Administered   Fluad Quad(high Dose 65+) 01/29/2021   Influenza Split 04/07/2011, 02/28/2012   Influenza Whole 04/15/2004, 04/18/2007, 01/27/2010   Influenza, High Dose Seasonal PF 02/04/2018, 12/10/2018   Influenza,inj,Quad PF,6+ Mos 03/16/2020   Influenza,inj,quad, With Preservative 01/29/2017   PFIZER(Purple Top)SARS-COV-2 Vaccination 09/14/2019, 10/12/2019   Pneumococcal Conjugate-13 12/03/2013   Pneumococcal Polysaccharide-23 05/02/2003, 09/29/2008, 12/10/2018   Tdap 04/07/2011   Health Maintenance Due  Topic Date Due   Zoster Vaccines- Shingrix (1 of 2) Never done   DTaP/Tdap/Td (2 - Td or Tdap) 04/06/2021      Past Medical History:  Diagnosis Date   ABNORMAL CHEST XRAY 11/05/2008   ACUTE URIS OF UNSPECIFIED SITE 10/22/2007   ALLERGIC RHINITIS 12/09/2006   ANEMIA 01/14/2009    Anxiety    Arthritis    ASTHMA 12/09/2006   Asthma    Cataract    bilateral -removed   CHF (congestive heart failure) (HCC)    Chronic pain syndrome 02/16/2009   COPD 12/09/2006   DEGENERATIVE JOINT DISEASE 12/09/2006   DIZZINESS 03/30/2010   DYSPNEA 01/27/2010   with heavy exertion   FREQUENCY, URINARY 03/30/2010   GERD 12/09/2006   Glossitis 01/14/2009   HELICOBACTER PYLORI GASTRITIS, HX OF 12/09/2006   HIATAL HERNIA 03/01/2010   History of hiatal hernia    HYPERLIPIDEMIA 04/18/2007   HYPERSOMNIA 08/31/2008   HYPERTENSION 12/09/2006   INSOMNIA-SLEEP DISORDER-UNSPEC 11/05/2008   LOW BACK PAIN 12/09/2006   Morbid obesity (HCC) 12/09/2006   Nonspecific (abnormal) findings on radiological and other examination of body structure 11/05/2008   OSTEOPOROSIS 12/09/2006   OVERACTIVE BLADDER 04/14/2008   PALPITATIONS, RECURRENT 01/27/2010   Stroke (HCC)    pt. stated light one years ago   SYNCOPE 11/05/2008   TRANSIENT ISCHEMIC ATTACK, HX OF 12/09/2006   Past Surgical History:  Procedure Laterality Date   COLONOSCOPY     LUMBAR DISC SURGERY     TONSILLECTOMY     as a child   TOTAL HIP ARTHROPLASTY Right 11/06/2012   Procedure: RIGHT TOTAL HIP ARTHROPLASTY WITH ACETABULAR AUTOGRAFT;  Surgeon: Loanne Drilling, MD;  Location: [facility];  Service: Orthopedics;  Laterality: Right;  TOTAL HIP ARTHROPLASTY Left 02/18/2014   Procedure: LEFT TOTAL HIP ARTHROPLASTY;  Surgeon: Loanne Drilling, MD;  Location: [facility];  Service: Orthopedics;  Laterality: Left;   TUBAL LIGATION      reports that she quit smoking about 50 years ago. Her smoking use included cigarettes. She started smoking about 75 years ago. She has a 12.5 pack-year smoking history. She has never used smokeless tobacco. She reports that she does not drink alcohol and does not use drugs. family history includes Heart disease in her mother; Hypertension in her brother; Lung cancer in her father. Allergies  Allergen Reactions    Chlorthalidone    Current Outpatient Medications on File Prior to Visit  Medication Sig Dispense Refill   acetaminophen (TYLENOL) 325 MG tablet Take 2 tablets (650 mg total) by mouth every 6 (six) hours as needed for mild pain (or Fever >/= 101). 40 tablet 0   albuterol (VENTOLIN HFA) 108 (90 Base) MCG/ACT inhaler INHALE 2 PUFFS INTO THE LUNGS EVERY 4 HOURS AS NEEDED FOR WHEEZING OR SHORTNESS OF BREATH. 8.5 each 1   ALPRAZolam (XANAX) 0.25 MG tablet TAKE 1 TABLET BY MOUTH TWICE A DAY AS NEEDED 60 tablet 2   amLODipine-benazepril (LOTREL) 10-40 MG capsule TAKE 1 CAPSULE BY MOUTH DAILY 100 capsule 2   aspirin EC 81 MG tablet Take 81 mg by mouth daily. Swallow whole.     cloNIDine (CATAPRES) 0.1 MG tablet TAKE 1 TABLET BY MOUTH TWICE A DAY 180 tablet 1   ferrous sulfate 325 (65 FE) MG EC tablet Take 325 mg by mouth daily with breakfast.     Fexofenadine HCl (MUCINEX ALLERGY PO) Take by mouth as needed.     fluocinolone (SYNALAR) 0.01 % external solution APPLY TOPICALLY 2 TIMES DAILY AS NEEDED. 60 mL 0   Fluticasone-Umeclidin-Vilant (TRELEGY ELLIPTA) 200-62.5-25 MCG/ACT AEPB Inhale 1 puff into the lungs daily. 60 each 2   Fluticasone-Umeclidin-Vilant (TRELEGY ELLIPTA) 200-62.5-25 MCG/ACT AEPB Inhale 1 puff into the lungs daily. 14 each 0   Fluticasone-Umeclidin-Vilant (TRELEGY ELLIPTA) 200-62.5-25 MCG/ACT AEPB Inhale 1 puff into the lungs daily at 6 (six) AM. 14 each 0   Fluticasone-Umeclidin-Vilant (TRELEGY ELLIPTA) 200-62.5-25 MCG/ACT AEPB TAKE 1 PUFF BY MOUTH EVERY DAY 60 each 2   furosemide (LASIX) 20 MG tablet TAKE 1 TABLET BY MOUTH IN THE  MORNING 100 tablet 2   gabapentin (NEURONTIN) 300 MG capsule TAKE 2 CAPSULE BY MOUTH 3 TIMES  DAILY 600 capsule 1   hydrALAZINE (APRESOLINE) 100 MG tablet Take 50 mg by mouth 3 (three) times daily.     lovastatin (MEVACOR) 40 MG tablet TAKE 1 TABLET BY MOUTH AT  BEDTIME 60 tablet 5   meclizine (ANTIVERT) 12.5 MG tablet TAKE 1 TABLET BY MOUTH 3 TIMES A DAY AS  NEEDED FOR DIZZINESS 30 tablet 2   pantoprazole (PROTONIX) 40 MG tablet TAKE 1 TABLET BY MOUTH DAILY 100 tablet 2   predniSONE (DELTASONE) 20 MG tablet Take 2 tablets (40 mg total) by mouth daily with breakfast. 8 tablet 0   Spacer/Aero-Holding Chambers DEVI Use as directed with inhaler. 1 each 0   spironolactone (ALDACTONE) 25 MG tablet Take 25 mg by mouth daily.     sucralfate (CARAFATE) 1 g tablet Take 1 tablet (1 g total) by mouth 4 (four) times daily -  with meals and at bedtime. 120 tablet 0   tiZANidine (ZANAFLEX) 4 MG tablet TAKE 1 TABLET BY MOUTH EVERY 6 HOURS AS NEEDED FOR MUSCLE SPASMS. 50 tablet 1  traMADol (ULTRAM) 50 MG tablet Take 1 tablet (50 mg total) by mouth every 6 (six) hours as needed. for pain 120 tablet 2   traZODone (DESYREL) 50 MG tablet TAKE 1/2 TO 1 TABLET BY MOUTH AT BEDTIME AS NEEDED FOR SLEEP 90 tablet 1   TRELEGY ELLIPTA 200-62.5-25 MCG/ACT AEPB INHALE 1 PUFF BY MOUTH EVERY DAY 60 each 2   triamcinolone cream (KENALOG) 0.1 % APPLY TO AFFECTED AREA TWICE A DAY 30 g 1   No current facility-administered medications on file prior to visit.        ROS:  All others reviewed and negative.  Objective        PE:  BP 130/82 (BP Location: Right Arm, Patient Position: Sitting, Cuff Size: Normal)   Pulse 70   Temp 99 F (37.2 C) (Oral)   Ht 5\' 4"  (1.626 m)   SpO2 95%   BMI 35.98 kg/m                 Constitutional: Pt appears in NAD               HENT: Head: NCAT.                Right Ear: External ear normal.                 Left Ear: External ear normal.                Eyes: . Pupils are equal, round, and reactive to light. Conjunctivae and EOM are normal               Nose: without d/c or deformity               Neck: Neck supple. Gross normal ROM               Cardiovascular: Normal rate and regular rhythm.                 Pulmonary/Chest: Effort normal and breath sounds without rales or wheezing.                Abd:  Soft, NT, ND, + BS, no organomegaly                Neurological: Pt is alert. At baseline orientation, motor grossly intact               Skin: Skin is warm. No rashes, no other new lesions, LE edema - trace bilateral               Psychiatric: Pt behavior is normal without agitation   Micro: none  Cardiac tracings I have personally interpreted today:  none  Pertinent Radiological findings (summarize): none   Lab Results  Component Value Date   WBC 7.0 11/09/2022   HGB 10.6 (L) 11/09/2022   HCT 34.0 (L) 11/09/2022   PLT 366.0 11/09/2022   GLUCOSE 98 11/09/2022   CHOL 167 11/09/2022   TRIG 73.0 11/09/2022   HDL 63.10 11/09/2022   LDLCALC 89 11/09/2022   ALT 10 11/09/2022   AST 16 11/09/2022   NA 136 11/09/2022   K 4.3 11/09/2022   CL 98 11/09/2022   CREATININE 0.98 11/09/2022   BUN 11 11/09/2022   CO2 33 (H) 11/09/2022   TSH 0.85 11/09/2022   INR 1.02 02/10/2014   HGBA1C 5.7 11/09/2022   MICROALBUR 0.7 11/09/2022   Assessment/Plan:  Wendy Torres is a 82 y.o. Black or African  American [2] female with  has a past medical history of ABNORMAL CHEST XRAY (11/05/2008), ACUTE URIS OF UNSPECIFIED SITE (10/22/2007), ALLERGIC RHINITIS (12/09/2006), ANEMIA (01/14/2009), Anxiety, Arthritis, ASTHMA (12/09/2006), Asthma, Cataract, CHF (congestive heart failure) (HCC), Chronic pain syndrome (02/16/2009), COPD (12/09/2006), DEGENERATIVE JOINT DISEASE (12/09/2006), DIZZINESS (03/30/2010), DYSPNEA (01/27/2010), FREQUENCY, URINARY (03/30/2010), GERD (12/09/2006), Glossitis (01/14/2009), HELICOBACTER PYLORI GASTRITIS, HX OF (12/09/2006), HIATAL HERNIA (03/01/2010), History of hiatal hernia, HYPERLIPIDEMIA (04/18/2007), HYPERSOMNIA (08/31/2008), HYPERTENSION (12/09/2006), INSOMNIA-SLEEP DISORDER-UNSPEC (11/05/2008), LOW BACK PAIN (12/09/2006), Morbid obesity (HCC) (12/09/2006), Nonspecific (abnormal) findings on radiological and other examination of body structure (11/05/2008), OSTEOPOROSIS (12/09/2006), OVERACTIVE BLADDER (04/14/2008),  PALPITATIONS, RECURRENT (01/27/2010), Stroke (HCC), SYNCOPE (11/05/2008), and TRANSIENT ISCHEMIC ATTACK, HX OF (12/09/2006).  Chronic respiratory failure with hypoxia (HCC) Ok for restart albuterol hfa prn  Congestive heart failure (CHF) (HCC) Volume stable, cont same tx  Encounter for well adult exam with abnormal findings Age and sex appropriate education and counseling updated with regular exercise and diet Referrals for preventative services - none needed Immunizations addressed - for tdap and shingrix at pharmacy Smoking counseling  - none needed Evidence for depression or other mood disorder - none significant Most recent labs reviewed. I have personally reviewed and have noted: 1) the patient's medical and social history 2) The patient's current medications and supplements 3) The patient's height, weight, and BMI have been recorded in the chart   Dyslipidemia, goal LDL below 130 Lab Results  Component Value Date   LDLCALC 89 11/09/2022   Stable, pt to continue current statin lovastatin 40 mg qd   Essential hypertension BP Readings from Last 3 Encounters:  11/09/22 130/82  09/08/22 138/82  06/28/22 (!) 142/60   Stable, pt to continue medical treatment lotrel 10-40 every day, clonindine 0.1 bid, hydralazine 50 tid   Iron deficiency anemia No recent overt bleeding, for iron lab f/u  Prediabetes Lab Results  Component Value Date   HGBA1C 5.7 11/09/2022   Stable, pt to continue current medical treatment  - diet, wt control   Vitamin D deficiency Last vitamin D Lab Results  Component Value Date   VD25OH 24.11 (L) 11/09/2022   Low, to start oral replacement   Overactive bladder With dry mouth - for change to vesicare 5 qd  Bilateral hearing loss Worsening, for audiology referral  Chronic pain syndrome With mild to mod left shoulder pain vs left cervical neuritis ,and also probable right ulnar neuritis with sitting extended time with elbow on the chair  - overall mild, pt declines ortho and NS referrals  Followup: Return in about 4 months (around 03/12/2023).  Oliver Barre, MD 11/11/2022 8:26 PM [facility]

## 2022-11-10 ENCOUNTER — Encounter: Payer: Self-pay | Admitting: Internal Medicine

## 2022-11-10 ENCOUNTER — Other Ambulatory Visit: Payer: Self-pay | Admitting: Internal Medicine

## 2022-11-10 DIAGNOSIS — D509 Iron deficiency anemia, unspecified: Secondary | ICD-10-CM

## 2022-11-10 LAB — URINALYSIS, ROUTINE W REFLEX MICROSCOPIC
Bilirubin Urine: NEGATIVE
Hgb urine dipstick: NEGATIVE
Ketones, ur: NEGATIVE
Leukocytes,Ua: NEGATIVE
Nitrite: NEGATIVE
RBC / HPF: NONE SEEN (ref 0–?)
Specific Gravity, Urine: <=1.005 — AB (ref 1.000–1.030)
Total Protein, Urine: NEGATIVE
Urine Glucose: NEGATIVE
Urobilinogen, UA: 0.2 (ref 0.0–1.0)
pH: 7 (ref 5.0–8.0)

## 2022-11-10 MED ORDER — POLYSACCHARIDE IRON COMPLEX 150 MG PO CAPS: 150.0000 mg | ORAL_CAPSULE | Freq: Every day | ORAL | 1 refills | Status: DC

## 2022-11-11 ENCOUNTER — Encounter: Payer: Self-pay | Admitting: Internal Medicine

## 2022-11-11 DIAGNOSIS — H9193 Unspecified hearing loss, bilateral: Secondary | ICD-10-CM | POA: Insufficient documentation

## 2022-11-11 NOTE — Assessment & Plan Note (Signed)
BP Readings from Last 3 Encounters:  11/09/22 130/82  09/08/22 138/82  06/28/22 (!) 142/60   Stable, pt to continue medical treatment lotrel 10-40 every day, clonindine 0.1 bid, hydralazine 50 tid

## 2022-11-11 NOTE — Assessment & Plan Note (Signed)
Age and sex appropriate education and counseling updated with regular exercise and diet Referrals for preventative services - none needed Immunizations addressed - for tdap and shingrix at pharmacy Smoking counseling  - none needed Evidence for depression or other mood disorder - none significant Most recent labs reviewed. I have personally reviewed and have noted: 1) the patient's medical and social history 2) The patient's current medications and supplements 3) The patient's height, weight, and BMI have been recorded in the chart  

## 2022-11-11 NOTE — Assessment & Plan Note (Signed)
No recent overt bleeding, for iron lab f/u 

## 2022-11-11 NOTE — Assessment & Plan Note (Signed)
Worsening, for audiology referral 

## 2022-11-11 NOTE — Assessment & Plan Note (Signed)
Lab Results  Component Value Date   LDLCALC 89 11/09/2022   Stable, pt to continue current statin lovastatin 40 mg qd

## 2022-11-11 NOTE — Assessment & Plan Note (Signed)
Volume stable, cont same tx 

## 2022-11-11 NOTE — Assessment & Plan Note (Signed)
With mild to mod left shoulder pain vs left cervical neuritis ,and also probable right ulnar neuritis with sitting extended time with elbow on the chair - overall mild, pt declines ortho and NS referrals

## 2022-11-11 NOTE — Assessment & Plan Note (Signed)
Lab Results  Component Value Date   HGBA1C 5.7 11/09/2022   Stable, pt to continue current medical treatment   - diet, wt control

## 2022-11-11 NOTE — Assessment & Plan Note (Signed)
Last vitamin D Lab Results  Component Value Date   VD25OH 24.11 (L) 11/09/2022   Low, to start oral replacement

## 2022-11-11 NOTE — Assessment & Plan Note (Signed)
Ok for restart albuterol hfa prn

## 2022-11-11 NOTE — Assessment & Plan Note (Signed)
With dry mouth - for change to vesicare 5 qd

## 2022-11-13 ENCOUNTER — Telehealth: Payer: Self-pay | Admitting: Internal Medicine

## 2022-11-13 MED ORDER — LOSARTAN POTASSIUM 100 MG PO TABS: 100.0000 mg | ORAL_TABLET | Freq: Every day | ORAL | 3 refills | Status: DC

## 2022-11-13 MED ORDER — AMLODIPINE BESYLATE 10 MG PO TABS: 10.0000 mg | ORAL_TABLET | Freq: Every day | ORAL | 3 refills | Status: DC

## 2022-11-13 NOTE — Telephone Encounter (Signed)
Pt daughter called stating that the pt lip and tongue is swelling and this been happening off on since February or March please advise.   Best call back Katrina [phone]

## 2022-11-13 NOTE — Telephone Encounter (Signed)
 See below

## 2022-11-13 NOTE — Telephone Encounter (Signed)
This is difficult to say definitely without an exam, but we can't r/o possible allergy reaction with lip swelling to the benazepril part of the Lotrel medication for BP  Ok to stop the Lotrel (amlodipine-benazepril)  Ok to use OTC Benadryl 25- 50 mg every 6 hrs as needed for further swelling  Please take all new medication as prescribed  - amlodipine 10 mg, AND Losartan 100 mg per day  Both done erx

## 2022-11-14 ENCOUNTER — Telehealth: Payer: Self-pay | Admitting: Radiology

## 2022-11-14 ENCOUNTER — Other Ambulatory Visit: Payer: Self-pay | Admitting: Radiology

## 2022-11-14 MED ORDER — LOSARTAN POTASSIUM 100 MG PO TABS: 100.0000 mg | ORAL_TABLET | Freq: Every day | ORAL | 3 refills | Status: DC

## 2022-11-14 MED ORDER — AMLODIPINE BESYLATE 10 MG PO TABS: 10.0000 mg | ORAL_TABLET | Freq: Every day | ORAL | 3 refills | Status: DC

## 2022-11-14 NOTE — Telephone Encounter (Signed)
Ok to take both since they are the same medication and she likely needs more iron since she was already taking some when we found she had the iron deficiency.   thanks

## 2022-11-14 NOTE — Telephone Encounter (Signed)
A user error has taken place: encounter opened in error, closed for administrative reasons.

## 2022-11-14 NOTE — Telephone Encounter (Signed)
Spoke with daughter Lady Gary and told to stop taking the Lotrel and to start Amlodipine and losartan. She understood and had no further questions. Was also told to call back in she had any more questions.

## 2022-11-16 ENCOUNTER — Encounter: Payer: Self-pay | Admitting: Internal Medicine

## 2022-11-16 NOTE — Telephone Encounter (Signed)
Okay with new prescription for concentrator.

## 2022-11-16 NOTE — Telephone Encounter (Signed)
Dr. Mannam please advise. °

## 2022-11-16 NOTE — Telephone Encounter (Signed)
I have placed order for new concentrator that will reach 10 lpm   Left detailed msg letting pt's daughter know.

## 2022-11-21 ENCOUNTER — Ambulatory Visit: Payer: Medicare Other | Admitting: Audiologist

## 2022-11-22 ENCOUNTER — Encounter: Payer: Self-pay | Admitting: Internal Medicine

## 2022-11-22 ENCOUNTER — Other Ambulatory Visit: Payer: Self-pay | Admitting: Internal Medicine

## 2022-11-22 DIAGNOSIS — M159 Polyosteoarthritis, unspecified: Secondary | ICD-10-CM

## 2022-11-23 ENCOUNTER — Ambulatory Visit: Payer: Medicare Other | Admitting: Physician Assistant

## 2022-11-23 ENCOUNTER — Other Ambulatory Visit: Payer: Self-pay

## 2022-11-23 ENCOUNTER — Other Ambulatory Visit: Payer: Self-pay | Admitting: Internal Medicine

## 2022-11-23 MED ORDER — SOLIFENACIN SUCCINATE 10 MG PO TABS: 10.0000 mg | ORAL_TABLET | Freq: Every day | ORAL | 1 refills | Status: DC

## 2022-11-30 ENCOUNTER — Ambulatory Visit: Payer: Medicare Other | Attending: Internal Medicine | Admitting: Audiology

## 2022-11-30 DIAGNOSIS — H903 Sensorineural hearing loss, bilateral: Secondary | ICD-10-CM | POA: Insufficient documentation

## 2022-11-30 NOTE — Procedures (Signed)
[facility] [address] [place], [place]  [address] [phone]  AUDIOLOGICAL  EVALUATION  NAME: Wendy Torres     DOB:   Jul 28, 1940      MRN: [id number]                                                                                     DATE: 11/30/2022     REFERENT: Corwin Levins, MD STATUS: Outpatient DIAGNOSIS: Sensorineural hearing loss, bilateral   History: Anyelina was seen for an audiological evaluation due to tinnitus occurring in the right ear.  She was accompanied to the appointment by her daughter.  Aysiah reports the tinnitus is constant in the right ear and is worse at night.  She denies ear pain aural fullness and dizziness.  Cinthya denies concerns regarding her hearing sensitivity.  Marely has an extensive medical history listed below.  Patient Active Problem List   Diagnosis Date Noted   Bilateral hearing loss 11/11/2022   Left ear hearing loss 12/27/2021   Dysphagia 12/27/2021   Vitamin D deficiency 11/02/2021   Carpal tunnel syndrome of right wrist 07/14/2021   Gastritis 12/01/2020   Vertigo 12/01/2020   Hemorrhoid 12/01/2020   Chronic respiratory failure with hypoxia (HCC) 10/18/2020   Urinary frequency 05/16/2020   Anxiety 05/16/2020   Chronic obstructive pulmonary disease (HCC) 02/17/2020   Pain and swelling of right knee 10/15/2019   Pain in left knee 11/10/2017   Prediabetes 10/03/2017   Congestive heart failure (CHF) (HCC) 10/05/2016   COPD exacerbation (HCC) 05/31/2016   Elevated total protein 05/31/2016   Tinnitus 05/05/2015   Deficiency anemia 03/02/2014   Constipation 11/12/2012   Overactive bladder 11/12/2012   OA (osteoarthritis) of hip 11/06/2012   Dysphagia, unspecified(787.20) 10/23/2012   Chest pain 10/23/2012   Osteoporosis 05/15/2012   DJD (degenerative joint disease) of knee 02/15/2012   Olecranon bursitis of right elbow 11/23/2011   Cervical radiculopathy 09/30/2011   Encounter for well  adult exam with abnormal findings 08/12/2010   Thrush 08/12/2010   HIATAL HERNIA 03/01/2010   Chronic pain syndrome 02/16/2009   Iron deficiency anemia 01/14/2009   SYNCOPE 11/05/2008   Insomnia 11/05/2008   OVERACTIVE BLADDER 04/14/2008   Dyslipidemia, goal LDL below 130 04/18/2007   Morbid obesity (HCC) 12/09/2006   Essential hypertension 12/09/2006   Allergic rhinitis 12/09/2006   Asthma 12/09/2006   COPD with chronic bronchitis and emphysema (HCC) 12/09/2006   GERD 12/09/2006   Osteoarthritis 12/09/2006    Evaluation:  Otoscopy showed a clear view of the tympanic membranes, bilaterally Tympanometry results were consistent with normal middle ear function (Type A), bilaterally.  Audiometric testing was completed using Conventional Audiometry techniques with insert earphones and TDH headphones. Test results are consistent with a mild to moderate sensorineural hearing loss, bilaterally. Asymmetry noted at 250 Hz worse in the right ear.  Speech Recognition Thresholds were obtained at 30 dB HL in the right ear and at 25  dB HL in the left ear. Word Recognition Testing was completed at 70 dB HL and Ninoska scored 100%, bilaterally.      Results:  The test results were reviewed with [facility]  and her daughter.  Today's test results are consistent with a mild to moderate sensorineural hearing loss in both ears.  Asymmetry noted at 250 Hz worse in the right ear.  She will have communication difficulty in adverse listening environments.  Milia will benefit from the use of amplification if motivated and the use of good communication strategies.  Malena's daughter was given a copy of the test results today.  Tinnitus management strategies were reviewed.  Landi's daughter was given a handout on tinnitus management strategies.  Recommendations: 1.   Use of hearings aids if motivated 2.   Use of tinnitus management strategies 3.   Monitor hearing sensitivity    30 minutes spent testing and  counseling on results.   If you have any questions please feel free to contact me at [phone].  Marton Redwood Audiologist, Au.D., CCC-A 11/30/2022  3:17 PM  Cc: Corwin Levins, MD

## 2022-12-19 ENCOUNTER — Emergency Department (HOSPITAL_COMMUNITY)
Admission: EM | Admit: 2022-12-19 | Discharge: 2022-12-20 | Disposition: A | Discharge status: Home / Self Care | Payer: Medicare Other | Attending: Emergency Medicine | Admitting: Emergency Medicine

## 2022-12-19 ENCOUNTER — Emergency Department (HOSPITAL_COMMUNITY): Payer: Medicare Other

## 2022-12-19 ENCOUNTER — Encounter (HOSPITAL_COMMUNITY): Payer: Self-pay

## 2022-12-19 ENCOUNTER — Other Ambulatory Visit: Payer: Self-pay

## 2022-12-19 DIAGNOSIS — R519 Headache, unspecified: Secondary | ICD-10-CM

## 2022-12-19 DIAGNOSIS — R6889 Other general symptoms and signs: Secondary | ICD-10-CM | POA: Diagnosis not present

## 2022-12-19 DIAGNOSIS — I10 Essential (primary) hypertension: Secondary | ICD-10-CM | POA: Insufficient documentation

## 2022-12-19 DIAGNOSIS — Z79899 Other long term (current) drug therapy: Secondary | ICD-10-CM | POA: Diagnosis not present

## 2022-12-19 DIAGNOSIS — Z7982 Long term (current) use of aspirin: Secondary | ICD-10-CM | POA: Diagnosis not present

## 2022-12-19 DIAGNOSIS — Z743 Need for continuous supervision: Secondary | ICD-10-CM | POA: Diagnosis not present

## 2022-12-19 DIAGNOSIS — R202 Paresthesia of skin: Secondary | ICD-10-CM | POA: Diagnosis not present

## 2022-12-19 LAB — BASIC METABOLIC PANEL
Anion gap: 8 (ref 5–15)
BUN: 14 mg/dL (ref 8–23)
CO2: 31 mmol/L (ref 22–32)
Calcium: 10.1 mg/dL (ref 8.9–10.3)
Chloride: 100 mmol/L (ref 98–111)
Creatinine, Ser: 0.83 mg/dL (ref 0.44–1.00)
GFR, Estimated: >60 mL/min (ref >60)
Glucose, Bld: 105 mg/dL — ABNORMAL HIGH (ref 70–99)
Potassium: 3.7 mmol/L (ref 3.5–5.1)
Sodium: 139 mmol/L (ref 135–145)

## 2022-12-19 LAB — CBC WITH DIFFERENTIAL/PLATELET
Abs Immature Granulocytes: 0.02 10*3/uL (ref 0.00–0.07)
Basophils Absolute: 0.1 10*3/uL (ref 0.0–0.1)
Basophils Relative: 1 %
Eosinophils Absolute: 0.2 10*3/uL (ref 0.0–0.5)
Eosinophils Relative: 2 %
HCT: 34.5 % — ABNORMAL LOW (ref 36.0–46.0)
Hemoglobin: 10.5 g/dL — ABNORMAL LOW (ref 12.0–15.0)
Immature Granulocytes: 0 %
Lymphocytes Relative: 21 %
Lymphs Abs: 1.9 10*3/uL (ref 0.7–4.0)
MCH: 25.8 pg — ABNORMAL LOW (ref 26.0–34.0)
MCHC: 30.4 g/dL (ref 30.0–36.0)
MCV: 84.8 fL (ref 80.0–100.0)
Monocytes Absolute: 1 10*3/uL (ref 0.1–1.0)
Monocytes Relative: 11 %
Neutro Abs: 5.6 10*3/uL (ref 1.7–7.7)
Neutrophils Relative %: 65 %
Platelets: 345 10*3/uL (ref 150–400)
RBC: 4.07 MIL/uL (ref 3.87–5.11)
RDW: 14.6 % (ref 11.5–15.5)
WBC: 8.7 10*3/uL (ref 4.0–10.5)
nRBC: 0 % (ref 0.0–0.2)

## 2022-12-19 MED ORDER — DEXAMETHASONE 4 MG PO TABS
10.0000 mg | ORAL_TABLET | Freq: Once | ORAL | Status: AC
  Administered 2022-12-20: 10 mg via ORAL
  Filled 2022-12-19: qty 1

## 2022-12-19 MED ORDER — METOCLOPRAMIDE HCL 5 MG/ML IJ SOLN
5.0000 mg | Freq: Once | INTRAMUSCULAR | Status: AC
  Administered 2022-12-19: 5 mg via INTRAVENOUS
  Filled 2022-12-19: qty 2

## 2022-12-19 MED ORDER — DIPHENHYDRAMINE HCL 50 MG/ML IJ SOLN
12.5000 mg | Freq: Once | INTRAMUSCULAR | Status: AC
  Administered 2022-12-19: 12.5 mg via INTRAVENOUS
  Filled 2022-12-19: qty 1

## 2022-12-19 NOTE — ED Provider Notes (Signed)
EMERGENCY DEPARTMENT AT [facility] Provider Note   CSN: [id number] Arrival date & time: 12/19/22  1926     History  Chief Complaint  Patient presents with   Hypertension    Wendy Torres is a 82 y.o. female.  82 yo F with a cc of HTN.  Going on for months.  She was about a month ago by her primary care provider and he made multiple medication changes.  She is continued to be on clonidine but had been taken off of a combo pill with amlodipine and was put in to amlodipine by itself and then losartan 100 mg.  She feels like the has not really been working.  Has been having a frontal headache she thinks for at least 4 months.  Has had some trouble with her vision for at least a month.  Eventually today they realize she had a couple blood pressures greater than 200 and decided to bring her here for evaluation.  She denies chest pain or difficulty breathing.  Denies one-sided numbness or weakness denies difficulty speech or swallowing.  She is on chronic pain to her left leg that she takes tramadol for.   Hypertension       Home Medications Prior to Admission medications   Medication Sig Start Date End Date Taking? Authorizing Provider  acetaminophen (TYLENOL) 325 MG tablet Take 2 tablets (650 mg total) by mouth every 6 (six) hours as needed for mild pain (or Fever >/= 101). 02/19/14   Perkins, Alexzandrew L, PA-C  albuterol (VENTOLIN HFA) 108 (90 Base) MCG/ACT inhaler INHALE 2 PUFFS INTO THE LUNGS EVERY 4 HOURS AS NEEDED FOR WHEEZING OR SHORTNESS OF BREATH. 11/01/21   Verlee Monte, MD  ALPRAZolam Prudy Feeler) 0.25 MG tablet TAKE 1 TABLET BY MOUTH TWICE A DAY AS NEEDED 10/04/22   Corwin Levins, MD  amLODipine (NORVASC) 10 MG tablet Take 1 tablet (10 mg total) by mouth daily. 11/14/22   Corwin Levins, MD  aspirin EC 81 MG tablet Take 81 mg by mouth daily. Swallow whole.    [provider]  cloNIDine (CATAPRES) 0.1 MG tablet TAKE 1 TABLET BY MOUTH TWICE A DAY  11/23/22   Corwin Levins, MD  ferrous sulfate 325 (65 FE) MG EC tablet Take 325 mg by mouth daily with breakfast.    [provider]  Fexofenadine HCl (MUCINEX ALLERGY PO) Take by mouth as needed.    [provider]  fluocinolone (SYNALAR) 0.01 % external solution APPLY TOPICALLY 2 TIMES DAILY AS NEEDED. 07/03/22   Verlee Monte, MD  Fluticasone-Umeclidin-Vilant (TRELEGY ELLIPTA) 200-62.5-25 MCG/ACT AEPB Inhale 1 puff into the lungs daily. 03/08/22   Chilton Greathouse, MD  furosemide (LASIX) 20 MG tablet TAKE 1 TABLET BY MOUTH IN THE  MORNING 09/26/22   Corwin Levins, MD  gabapentin (NEURONTIN) 300 MG capsule TAKE 2 CAPSULE BY MOUTH 3 TIMES  DAILY 09/18/22   Corwin Levins, MD  hydrALAZINE (APRESOLINE) 100 MG tablet Take 50 mg by mouth 3 (three) times daily. 07/12/20   [provider]  iron polysaccharides (NU-IRON) 150 MG capsule Take 1 capsule (150 mg total) by mouth daily. 11/10/22   Corwin Levins, MD  losartan (COZAAR) 100 MG tablet Take 1 tablet (100 mg total) by mouth daily. 11/14/22   Corwin Levins, MD  lovastatin (MEVACOR) 40 MG tablet TAKE 1 TABLET BY MOUTH AT  BEDTIME 06/08/22   Corwin Levins, MD  meclizine (ANTIVERT) 12.5  MG tablet TAKE 1 TABLET BY MOUTH 3 TIMES A DAY AS NEEDED FOR DIZZINESS 06/22/21   Corwin Levins, MD  nystatin (MYCOSTATIN) 100000 UNIT/ML suspension Swish and spit 5mL two times daily after using Symbicort. 11/09/22   Corwin Levins, MD  pantoprazole (PROTONIX) 40 MG tablet TAKE 1 TABLET BY MOUTH DAILY 06/26/22   Corwin Levins, MD  predniSONE (DELTASONE) 20 MG tablet Take 2 tablets (40 mg total) by mouth daily with breakfast. 07/01/21   Myrlene Broker, MD  solifenacin (VESICARE) 10 MG tablet Take 1 tablet (10 mg total) by mouth daily. 11/23/22   Corwin Levins, MD  Spacer/Aero-Holding [facility] Use as directed with inhaler. 09/13/21   Verlee Monte, MD  sucralfate (CARAFATE) 1 g tablet Take 1 tablet (1 g total) by mouth 4 (four) times daily -  with meals  and at bedtime. 12/01/20   Corwin Levins, MD  tiZANidine (ZANAFLEX) 4 MG tablet TAKE 1 TABLET BY MOUTH EVERY 6 HOURS AS NEEDED FOR MUSCLE SPASMS. 11/23/22   Corwin Levins, MD  traMADol (ULTRAM) 50 MG tablet TAKE 1 TABLET BY MOUTH EVERY 6 HOURS AS NEEDED. FOR PAIN 11/23/22   Corwin Levins, MD  traZODone (DESYREL) 50 MG tablet TAKE 1/2 TO 1 TABLET BY MOUTH AT BEDTIME AS NEEDED FOR SLEEP 11/23/22   Corwin Levins, MD  TRELEGY ELLIPTA 200-62.5-25 MCG/ACT AEPB INHALE 1 PUFF BY MOUTH EVERY DAY 11/03/22   Mannam, Colbert Coyer, MD  triamcinolone cream (KENALOG) 0.1 % APPLY TO AFFECTED AREA TWICE A DAY 07/10/22   Corwin Levins, MD      Allergies    Chlorthalidone    Review of Systems   Review of Systems  Physical Exam Updated Vital Signs BP (!) 176/66   Pulse 70   Temp 98.6 F (37 C) (Oral)   Resp 19   Ht 5\' 4"  (1.626 m)   Wt 90.7 kg   SpO2 99%   BMI 34.33 kg/m  Physical Exam Vitals and nursing note reviewed.  Constitutional:      General: She is not in acute distress.    Appearance: She is well-developed. She is not diaphoretic.  HENT:     Head: Normocephalic and atraumatic.  Eyes:     Pupils: Pupils are equal, round, and reactive to light.  Cardiovascular:     Rate and Rhythm: Normal rate and regular rhythm.     Heart sounds: No murmur heard.    No friction rub. No gallop.  Pulmonary:     Effort: Pulmonary effort is normal.     Breath sounds: No wheezing or rales.  Abdominal:     General: There is no distension.     Palpations: Abdomen is soft.     Tenderness: There is no abdominal tenderness.  Musculoskeletal:        General: No tenderness.     Cervical back: Normal range of motion and neck supple.  Skin:    General: Skin is warm and dry.  Neurological:     Mental Status: She is alert and oriented to person, place, and time.     Cranial Nerves: Cranial nerves 2-12 are intact.     Sensory: Sensation is intact.     Motor: Motor function is intact.     Coordination: Coordination is  intact.     Comments: The patient has some difficulty maneuvering her left lower extremity due to what seems like hip pain.  Otherwise has a benign neurologic  exam.  Psychiatric:        Behavior: Behavior normal.     ED Results / Procedures / Treatments   Labs (all labs ordered are listed, but only abnormal results are displayed) Labs Reviewed  CBC WITH DIFFERENTIAL/PLATELET - Abnormal; Notable for the following components:      Result Value   Hemoglobin 10.5 (*)    HCT 34.5 (*)    MCH 25.8 (*)    All other components within normal limits  BASIC METABOLIC PANEL - Abnormal; Notable for the following components:   Glucose, Bld 105 (*)    All other components within normal limits    EKG EKG Interpretation Date/Time:  Tuesday December 19 2022 19:51:12 EDT Ventricular Rate:  73 PR Interval:  162 QRS Duration:  86 QT Interval:  380 QTC Calculation: 419 R Axis:   1  Text Interpretation: Sinus rhythm No significant change since last tracing Confirmed by Melene Plan [phone]) on 12/19/2022 9:01:22 PM  Radiology CT Head Wo Contrast  Result Date: 12/19/2022 CLINICAL DATA:  Headache, blurry vision, bilateral hand paresthesia EXAM: CT HEAD WITHOUT CONTRAST TECHNIQUE: Contiguous axial images were obtained from the base of the skull through the vertex without intravenous contrast. RADIATION DOSE REDUCTION: This exam was performed according to the departmental dose-optimization program which includes automated exposure control, adjustment of the mA and/or kV according to patient size and/or use of iterative reconstruction technique. COMPARISON:  05/01/2020 FINDINGS: Brain: Normal anatomic configuration of the brain. Well preserved parenchymal volume. Extensive periventricular white matter changes are present likely reflecting the sequela of small vessel ischemia, stable since prior examination. No evidence of acute intracranial hemorrhage or infarct. No abnormal mass effect or midline shift. No  abnormal intra or extra-axial mass lesion. Ventricular size is normal. Cerebellum is unremarkable Vascular: No hyperdense vessel or unexpected calcification. Skull: Normal. Negative for fracture or focal lesion. Sinuses/Orbits: No acute finding. Other: Mastoid air cells and middle ear cavities are clear IMPRESSION: 1. No acute intracranial hemorrhage or infarct. 2. Extensive periventricular white matter changes likely reflecting the sequela of small vessel ischemia, stable since prior examination. Electronically Signed   By: Helyn Numbers M.D.   On: 12/19/2022 21:34    Procedures Procedures    Medications Ordered in ED Medications  dexamethasone (DECADRON) tablet 10 mg (has no administration in time range)  metoCLOPramide (REGLAN) injection 5 mg (5 mg Intravenous Given 12/19/22 2054)  diphenhydrAMINE (BENADRYL) injection 12.5 mg (12.5 mg Intravenous Given 12/19/22 2055)    ED Course/ Medical Decision Making/ A&P                                 Medical Decision Making Amount and/or Complexity of Data Reviewed Labs: ordered. Radiology: ordered.  Risk Prescription drug management.   82 yo F with a chief complaints of high blood pressure.  Patient has been having headaches and vision changes for months.  Vision changes were more recently and for little bit over a month.  Will obtain a head CT.  Basic blood work.  CT of the head independently interpreted by me without intracranial hemorrhage.  Patient without electrolyte abnormality, no significant anemia.  Her headache is completely resolved on repeat assessment.  Her blood pressure also has trended downward.  Will discharge home.  Give outpatient neurology follow-up.  Encouraged her to follow-up with her PCP.  11:01 PM:  I have discussed the diagnosis/risks/treatment options with the patient and family.  Evaluation and diagnostic testing in the emergency department does not suggest an emergent condition requiring admission or immediate  intervention beyond what has been performed at this time.  They will follow up with PCP, neuro. We also discussed returning to the ED immediately if new or worsening sx occur. We discussed the sx which are most concerning (e.g., sudden worsening pain, fever, inability to tolerate by mouth, stroke s/sx, cp, sob) that necessitate immediate return. Medications administered to the patient during their visit and any new prescriptions provided to the patient are listed below.  Medications given during this visit Medications  dexamethasone (DECADRON) tablet 10 mg (has no administration in time range)  metoCLOPramide (REGLAN) injection 5 mg (5 mg Intravenous Given 12/19/22 2054)  diphenhydrAMINE (BENADRYL) injection 12.5 mg (12.5 mg Intravenous Given 12/19/22 2055)     The patient appears reasonably screen and/or stabilized for discharge and I doubt any other medical condition or other [facility] requiring further screening, evaluation, or treatment in the ED at this time prior to discharge.          Final Clinical Impression(s) / ED Diagnoses Final diagnoses:  Frontal headache  Uncontrolled hypertension    Rx / DC Orders ED Discharge Orders          Ordered    Ambulatory referral to Neurology       Comments: Headache syndrome?   12/19/22 2253              Melene Plan, DO 12/19/22 2301

## 2022-12-19 NOTE — Discharge Instructions (Signed)
Your test here did not show any obvious acute change from your normal.  Please let your family doctor know that your blood pressures have been higher than you would like.  Please return for sudden worsening headache one-sided numbness or weakness difficulty with speech or swallowing.  Chest pain or difficulty breathing.  I have placed a referral in for a neurology office to call you to try and set up an appointment.

## 2022-12-19 NOTE — ED Triage Notes (Signed)
Pt BIBA([facility])from home with Hypertension. S/Sx include H/A, bil hand tingling, and blurry vision.

## 2022-12-19 NOTE — ED Notes (Signed)
Per [facility]@ [facility] Lab- recollect needed on light green specimen tube. [employer]

## 2022-12-21 ENCOUNTER — Other Ambulatory Visit: Payer: Self-pay

## 2022-12-21 NOTE — Patient Outreach (Signed)
Aging Gracefully Program  12/21/2022  JENNETTE PELPHREY 08-Jan-1941 [id number]   [facility] Evaluation Interviewer made contact with patient. Aging Gracefully 5 month survey completed.    Vanice Sarah Care Management Assistant [phone]

## 2022-12-25 ENCOUNTER — Ambulatory Visit (INDEPENDENT_AMBULATORY_CARE_PROVIDER_SITE_OTHER): Payer: Medicare Other | Admitting: Internal Medicine

## 2022-12-25 ENCOUNTER — Encounter: Payer: Self-pay | Admitting: Internal Medicine

## 2022-12-25 VITALS — BP 150/60 | HR 61 | Temp 98.8°F | Ht 64.0 in | Wt 206.0 lb

## 2022-12-25 DIAGNOSIS — J449 Chronic obstructive pulmonary disease, unspecified: Secondary | ICD-10-CM | POA: Diagnosis not present

## 2022-12-25 DIAGNOSIS — Z23 Encounter for immunization: Secondary | ICD-10-CM | POA: Diagnosis not present

## 2022-12-25 DIAGNOSIS — J9611 Chronic respiratory failure with hypoxia: Secondary | ICD-10-CM | POA: Diagnosis not present

## 2022-12-25 DIAGNOSIS — M170 Bilateral primary osteoarthritis of knee: Secondary | ICD-10-CM

## 2022-12-25 DIAGNOSIS — M159 Polyosteoarthritis, unspecified: Secondary | ICD-10-CM

## 2022-12-25 DIAGNOSIS — I10 Essential (primary) hypertension: Secondary | ICD-10-CM

## 2022-12-25 MED ORDER — TRAMADOL HCL 50 MG PO TABS
50.0000 mg | ORAL_TABLET | Freq: Four times a day (QID) | ORAL | 2 refills | Status: DC | PRN
Start: 2022-12-25 — End: 2023-06-11

## 2022-12-25 MED ORDER — HYDRALAZINE HCL 100 MG PO TABS: 100.0000 mg | ORAL_TABLET | Freq: Three times a day (TID) | ORAL | 3 refills | Status: DC

## 2022-12-25 NOTE — Assessment & Plan Note (Signed)
Sat normal today, cont home o2,  to f/u any worsening symptoms or concerns

## 2022-12-25 NOTE — Patient Instructions (Addendum)
You had the flu shot today  Ok to increase the hydralazine to 100 mg three times per day  Please continue all other medications as before, and refills have been done - tramadol  Please have the pharmacy call with any other refills you may need.  Please continue your efforts at being more active, low cholesterol diet, and weight control  Please keep your appointments with your specialists as you may have planned  No further xray or lab for now today  Please make an Appointment to return in November 11, or sooner if needed

## 2022-12-25 NOTE — Assessment & Plan Note (Signed)
BP Readings from Last 3 Encounters:  12/25/22 (!) 150/60  12/20/22 (!) 164/68  11/09/22 130/82   uncontroleld, pt to continue medical treatment norvasc 10 every day, clonidine 0.1 bid, lasix 20 every day, losartan 10 0 every day, and increase the hydralazine to 100 tid, cont to monitor at [facility]

## 2022-12-25 NOTE — Assessment & Plan Note (Signed)
Chronic persistent, for restart tramadol prn,  to f/u any worsening symptoms or concerns

## 2022-12-25 NOTE — Progress Notes (Signed)
Patient ID: Wendy Torres, female   DOB: 10/10/1940, 82 y.o.   MRN: [id number]        Chief Complaint: follow up HTN, bilateral knee pain, copd, chronic resp failure       HPI:  Wendy Torres is a 82 y.o. female here with c/o overall doing ok but ad severe HA with elevatedsbp 200 and had to go to ED, tx for severe HA and improved  BP remains elevated at home and today.  Pt denies chest pain, increased sob or doe, wheezing, orthopnea, PND, increased LE swelling, palpitations, dizziness or syncope.   Pt denies polydipsia, polyuria, or new focal neuro s/s.   Pt denies fever, wt loss, night sweats, loss of appetite, or other constitutional symptoms  Denies urinary symptoms such as dysuria, frequency, urgency, flank pain, hematuria or n/v, fever, chills.         Wt Readings from Last 3 Encounters:  12/25/22 206 lb (93.4 kg)  12/19/22 200 lb (90.7 kg)  09/08/22 209 lb 9.6 oz (95.1 kg)   BP Readings from Last 3 Encounters:  12/25/22 (!) 150/60  12/20/22 (!) 164/68  11/09/22 130/82         Past Medical History:  Diagnosis Date   ABNORMAL CHEST XRAY 11/05/2008   ACUTE URIS OF UNSPECIFIED SITE 10/22/2007   ALLERGIC RHINITIS 12/09/2006   ANEMIA 01/14/2009   Anxiety    Arthritis    ASTHMA 12/09/2006   Asthma    Cataract    bilateral -removed   CHF (congestive heart failure) (HCC)    Chronic pain syndrome 02/16/2009   COPD 12/09/2006   DEGENERATIVE JOINT DISEASE 12/09/2006   DIZZINESS 03/30/2010   DYSPNEA 01/27/2010   with heavy exertion   FREQUENCY, URINARY 03/30/2010   GERD 12/09/2006   Glossitis 01/14/2009   HELICOBACTER PYLORI GASTRITIS, HX OF 12/09/2006   HIATAL HERNIA 03/01/2010   History of hiatal hernia    HYPERLIPIDEMIA 04/18/2007   HYPERSOMNIA 08/31/2008   HYPERTENSION 12/09/2006   INSOMNIA-SLEEP DISORDER-UNSPEC 11/05/2008   LOW BACK PAIN 12/09/2006   Morbid obesity (HCC) 12/09/2006   Nonspecific (abnormal) findings on radiological and other examination of body  structure 11/05/2008   OSTEOPOROSIS 12/09/2006   OVERACTIVE BLADDER 04/14/2008   PALPITATIONS, RECURRENT 01/27/2010   Stroke (HCC)    pt. stated light one years ago   SYNCOPE 11/05/2008   TRANSIENT ISCHEMIC ATTACK, HX OF 12/09/2006   Past Surgical History:  Procedure Laterality Date   COLONOSCOPY     LUMBAR DISC SURGERY     TONSILLECTOMY     as a child   TOTAL HIP ARTHROPLASTY Right 11/06/2012   Procedure: RIGHT TOTAL HIP ARTHROPLASTY WITH ACETABULAR AUTOGRAFT;  Surgeon: Loanne Drilling, MD;  Location: [facility];  Service: Orthopedics;  Laterality: Right;   TOTAL HIP ARTHROPLASTY Left 02/18/2014   Procedure: LEFT TOTAL HIP ARTHROPLASTY;  Surgeon: Loanne Drilling, MD;  Location: [facility];  Service: Orthopedics;  Laterality: Left;   TUBAL LIGATION      reports that she quit smoking about 50 years ago. Her smoking use included cigarettes. She started smoking about 75 years ago. She has a 12.5 pack-year smoking history. She has never used smokeless tobacco. She reports that she does not drink alcohol and does not use drugs. family history includes Heart disease in her mother; Hypertension in her brother; Lung cancer in her father. Allergies  Allergen Reactions   Chlorthalidone    Current Outpatient Medications on File Prior to Visit  Medication  Sig Dispense Refill   acetaminophen (TYLENOL) 325 MG tablet Take 2 tablets (650 mg total) by mouth every 6 (six) hours as needed for mild pain (or Fever >/= 101). 40 tablet 0   albuterol (VENTOLIN HFA) 108 (90 Base) MCG/ACT inhaler INHALE 2 PUFFS INTO THE LUNGS EVERY 4 HOURS AS NEEDED FOR WHEEZING OR SHORTNESS OF BREATH. 8.5 each 1   ALPRAZolam (XANAX) 0.25 MG tablet TAKE 1 TABLET BY MOUTH TWICE A DAY AS NEEDED 60 tablet 2   amLODipine (NORVASC) 10 MG tablet Take 1 tablet (10 mg total) by mouth daily. 90 tablet 3   aspirin EC 81 MG tablet Take 81 mg by mouth daily. Swallow whole.     cloNIDine (CATAPRES) 0.1 MG tablet TAKE 1 TABLET BY MOUTH TWICE A DAY  180 tablet 3   ferrous sulfate 325 (65 FE) MG EC tablet Take 325 mg by mouth daily with breakfast.     Fexofenadine HCl (MUCINEX ALLERGY PO) Take by mouth as needed.     fluocinolone (SYNALAR) 0.01 % external solution APPLY TOPICALLY 2 TIMES DAILY AS NEEDED. 60 mL 0   Fluticasone-Umeclidin-Vilant (TRELEGY ELLIPTA) 200-62.5-25 MCG/ACT AEPB Inhale 1 puff into the lungs daily. 60 each 2   furosemide (LASIX) 20 MG tablet TAKE 1 TABLET BY MOUTH IN THE  MORNING 100 tablet 2   gabapentin (NEURONTIN) 300 MG capsule TAKE 2 CAPSULE BY MOUTH 3 TIMES  DAILY 600 capsule 1   iron polysaccharides (NU-IRON) 150 MG capsule Take 1 capsule (150 mg total) by mouth daily. 90 capsule 1   losartan (COZAAR) 100 MG tablet Take 1 tablet (100 mg total) by mouth daily. 90 tablet 3   lovastatin (MEVACOR) 40 MG tablet TAKE 1 TABLET BY MOUTH AT  BEDTIME 60 tablet 5   meclizine (ANTIVERT) 12.5 MG tablet TAKE 1 TABLET BY MOUTH 3 TIMES A DAY AS NEEDED FOR DIZZINESS 30 tablet 2   nystatin (MYCOSTATIN) 100000 UNIT/ML suspension Swish and spit 5mL two times daily after using Symbicort. 473 mL 5   pantoprazole (PROTONIX) 40 MG tablet TAKE 1 TABLET BY MOUTH DAILY 100 tablet 2   predniSONE (DELTASONE) 20 MG tablet Take 2 tablets (40 mg total) by mouth daily with breakfast. 8 tablet 0   solifenacin (VESICARE) 10 MG tablet Take 1 tablet (10 mg total) by mouth daily. 90 tablet 1   Spacer/Aero-Holding [facility] Use as directed with inhaler. 1 each 0   sucralfate (CARAFATE) 1 g tablet Take 1 tablet (1 g total) by mouth 4 (four) times daily -  with meals and at bedtime. 120 tablet 0   tiZANidine (ZANAFLEX) 4 MG tablet TAKE 1 TABLET BY MOUTH EVERY 6 HOURS AS NEEDED FOR MUSCLE SPASMS. 50 tablet 1   traZODone (DESYREL) 50 MG tablet TAKE 1/2 TO 1 TABLET BY MOUTH AT BEDTIME AS NEEDED FOR SLEEP 90 tablet 1   TRELEGY ELLIPTA 200-62.5-25 MCG/ACT AEPB INHALE 1 PUFF BY MOUTH EVERY DAY 60 each 2   triamcinolone cream (KENALOG) 0.1 % APPLY TO  AFFECTED AREA TWICE A DAY 30 g 1   No current facility-administered medications on file prior to visit.        ROS:  All others reviewed and negative.  Objective        PE:  BP (!) 150/60 (BP Location: Left Arm, Patient Position: Sitting, Cuff Size: Normal)   Pulse 61   Temp 98.8 F (37.1 C) (Oral)   Ht 5\' 4"  (1.626 m)   Wt 206 lb (93.4  kg)   SpO2 94%   BMI 35.36 kg/m                 Constitutional: Pt appears in NAD               HENT: Head: NCAT.                Right Ear: External ear normal.                 Left Ear: External ear normal.                Eyes: . Pupils are equal, round, and reactive to light. Conjunctivae and EOM are normal               Nose: without d/c or deformity               Neck: Neck supple. Gross normal ROM               Cardiovascular: Normal rate and regular rhythm.                 Pulmonary/Chest: Effort normal and breath sounds decreased without rales or wheezing.                Abd:  Soft, NT, ND, + BS, no organomegaly               Neurological: Pt is alert. At baseline orientation, motor grossly intact               Skin: Skin is warm. No rashes, no other new lesions, LE edema - none               Psychiatric: Pt behavior is normal without agitation   Micro: none  Cardiac tracings I have personally interpreted today:  none  Pertinent Radiological findings (summarize): none   Lab Results  Component Value Date   WBC 8.7 12/19/2022   HGB 10.5 (L) 12/19/2022   HCT 34.5 (L) 12/19/2022   PLT 345 12/19/2022   GLUCOSE 105 (H) 12/19/2022   CHOL 167 11/09/2022   TRIG 73.0 11/09/2022   HDL 63.10 11/09/2022   LDLCALC 89 11/09/2022   ALT 10 11/09/2022   AST 16 11/09/2022   NA 139 12/19/2022   K 3.7 12/19/2022   CL 100 12/19/2022   CREATININE 0.83 12/19/2022   BUN 14 12/19/2022   CO2 31 12/19/2022   TSH 0.85 11/09/2022   INR 1.02 02/10/2014   HGBA1C 5.7 11/09/2022   MICROALBUR 0.7 11/09/2022   Assessment/Plan:  Wendy Torres is a 82  y.o. Black or African American [2] female with  has a past medical history of ABNORMAL CHEST XRAY (11/05/2008), ACUTE URIS OF UNSPECIFIED SITE (10/22/2007), ALLERGIC RHINITIS (12/09/2006), ANEMIA (01/14/2009), Anxiety, Arthritis, ASTHMA (12/09/2006), Asthma, Cataract, CHF (congestive heart failure) (HCC), Chronic pain syndrome (02/16/2009), COPD (12/09/2006), DEGENERATIVE JOINT DISEASE (12/09/2006), DIZZINESS (03/30/2010), DYSPNEA (01/27/2010), FREQUENCY, URINARY (03/30/2010), GERD (12/09/2006), Glossitis (01/14/2009), HELICOBACTER PYLORI GASTRITIS, HX OF (12/09/2006), HIATAL HERNIA (03/01/2010), History of hiatal hernia, HYPERLIPIDEMIA (04/18/2007), HYPERSOMNIA (08/31/2008), HYPERTENSION (12/09/2006), INSOMNIA-SLEEP DISORDER-UNSPEC (11/05/2008), LOW BACK PAIN (12/09/2006), Morbid obesity (HCC) (12/09/2006), Nonspecific (abnormal) findings on radiological and other examination of body structure (11/05/2008), OSTEOPOROSIS (12/09/2006), OVERACTIVE BLADDER (04/14/2008), PALPITATIONS, RECURRENT (01/27/2010), Stroke (HCC), SYNCOPE (11/05/2008), and TRANSIENT ISCHEMIC ATTACK, HX OF (12/09/2006).  Essential hypertension BP Readings from Last 3 Encounters:  12/25/22 (!) 150/60  12/20/22 (!) 164/68  11/09/22 130/82   uncontroleld, pt to continue medical treatment  norvasc 10 every day, clonidine 0.1 bid, lasix 20 every day, losartan 10 0 every day, and increase the hydralazine to 100 tid, cont to monitor at ome   Osteoarthritis Chronic persistent, for restart tramadol prn,  to f/u any worsening symptoms or concerns  Chronic obstructive pulmonary disease (HCC) Stable overall, cont inhaler prn  Chronic respiratory failure with hypoxia (HCC) Sat normal today, cont home o2,  to f/u any worsening symptoms or concerns  Followup: No follow-ups on file.  Oliver Barre, MD 12/25/2022 12:15 PM [facility]

## 2022-12-25 NOTE — Assessment & Plan Note (Signed)
Stable overall, cont inhaler prn 

## 2022-12-28 ENCOUNTER — Other Ambulatory Visit: Payer: Self-pay | Admitting: Internal Medicine

## 2022-12-29 ENCOUNTER — Telehealth: Payer: Self-pay

## 2022-12-29 NOTE — Telephone Encounter (Signed)
Transition Care Management Follow-up Telephone Call Date of discharge and from where: 12/20/2022 [facility] How have you been since you were released from the hospital? Patient's daughter stated she is feeling much better. Any questions or concerns? No  Items Reviewed: Did the pt receive and understand the discharge instructions provided? Yes  Medications obtained and verified? Yes  Other?  Per patient's daughter request [id number] referral to [facility] [employer] [facility]. Explained to daughter that there is a waitlist. Any new allergies since your discharge?  Patient's daughter stated she has an allergic reaction to Benazapril Dietary orders reviewed? Yes Do you have support at home? Yes   Follow up appointments reviewed:  PCP Hospital f/u appt confirmed? Yes  Scheduled to see Oliver Barre, MD on 12/25/2022 @ [facility] [place]. Specialist Hospital f/u appt confirmed? No  Scheduled to see  on  @ . Are transportation arrangements needed? No  If their condition worsens, is the pt aware to call PCP or go to the Emergency Dept.? Yes Was the patient provided with contact information for the PCP's office or ED? Yes Was to pt encouraged to call back with questions or concerns? Yes  Khelani Kops [facility]  [facility] Population Health Community Resource Care Guide   [email or web address]  ?? [phone]   Website: [email or web address]  [email or web address]

## 2023-01-01 IMAGING — DX DG CHEST 2V
2 series · 2 of 2 positions shown · non-contrast
Comparison: Chest x-ray 08/25/2017.

CLINICAL DATA: 79-year-old female with history of COPD.

EXAM:
CHEST - 2 VIEW

[chest pa]
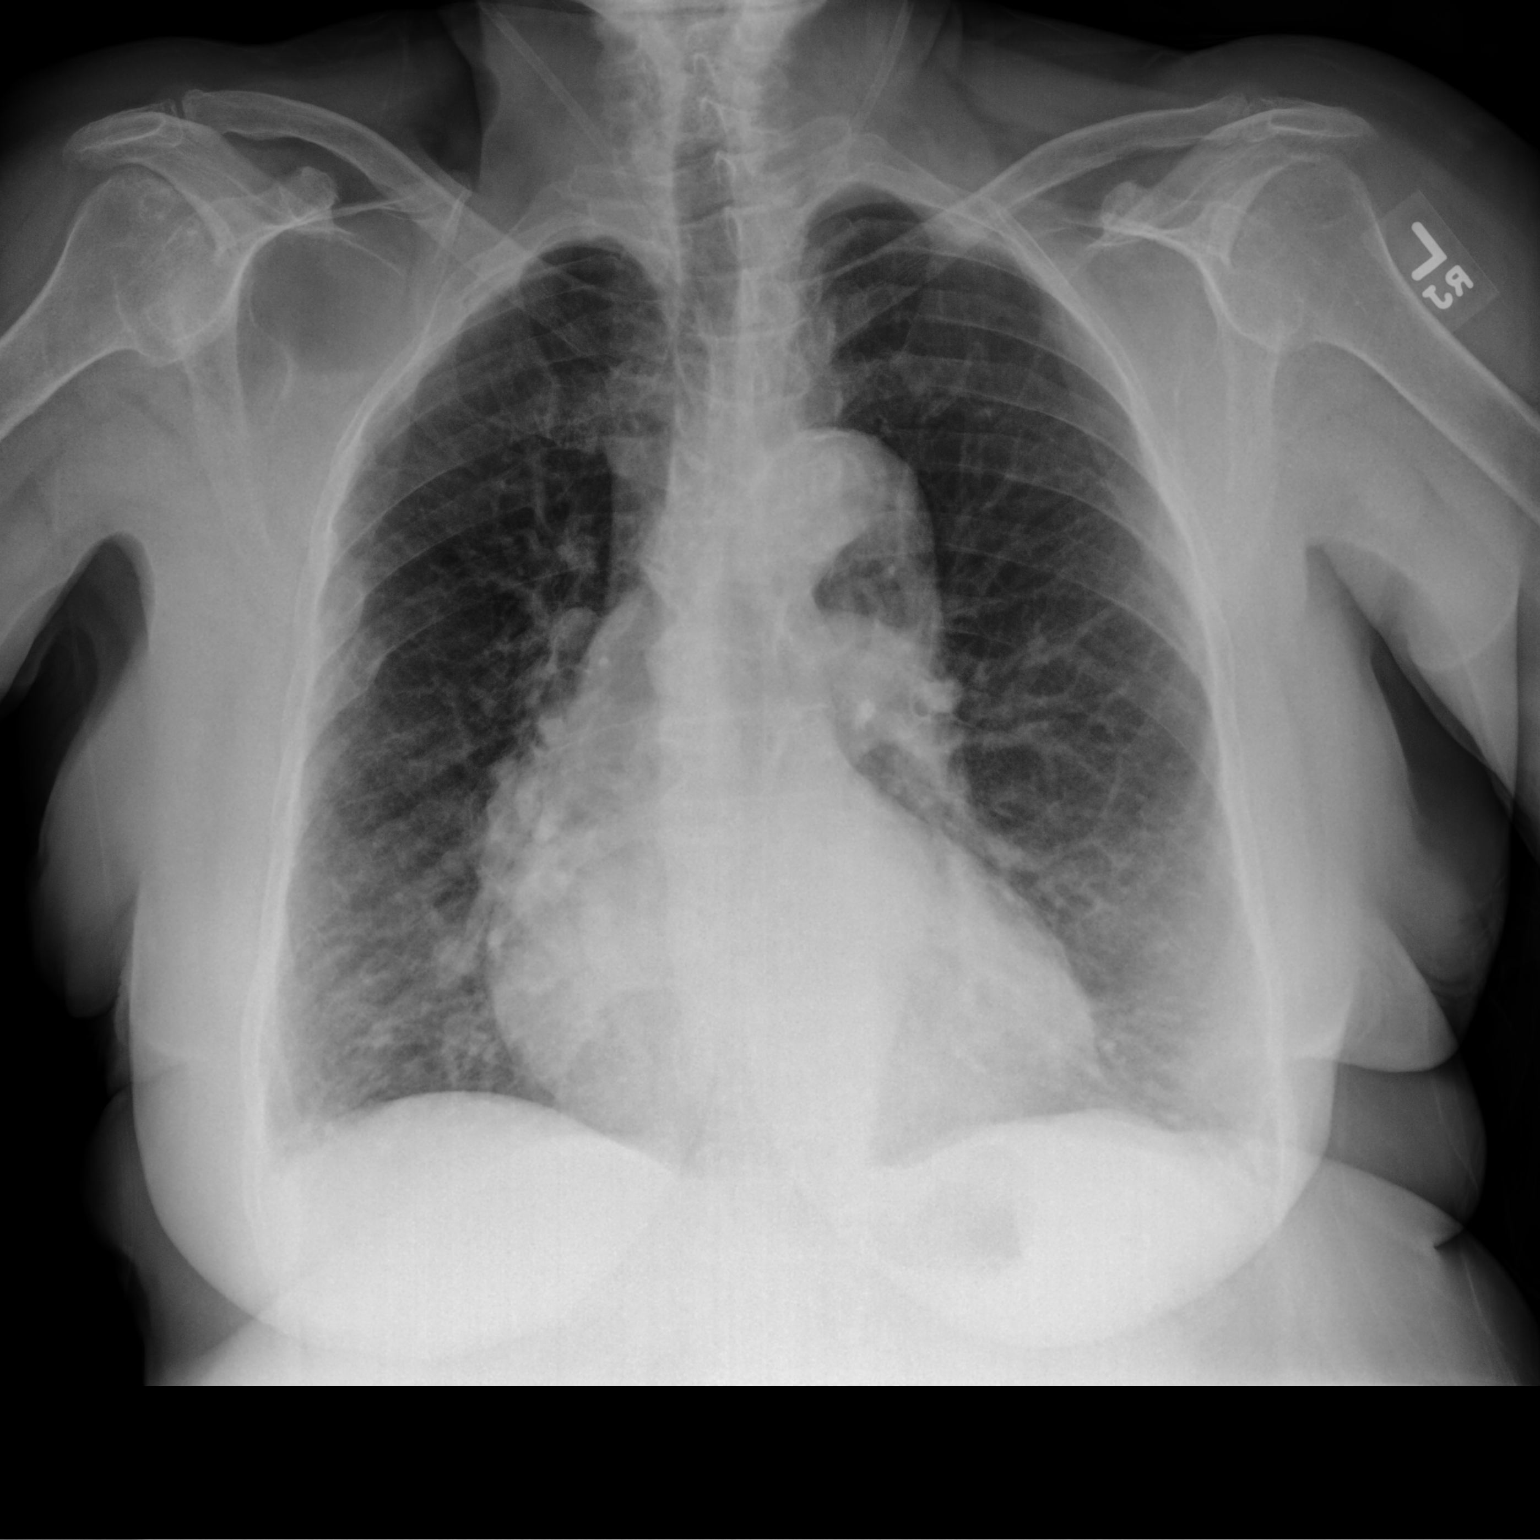

[chest lat]
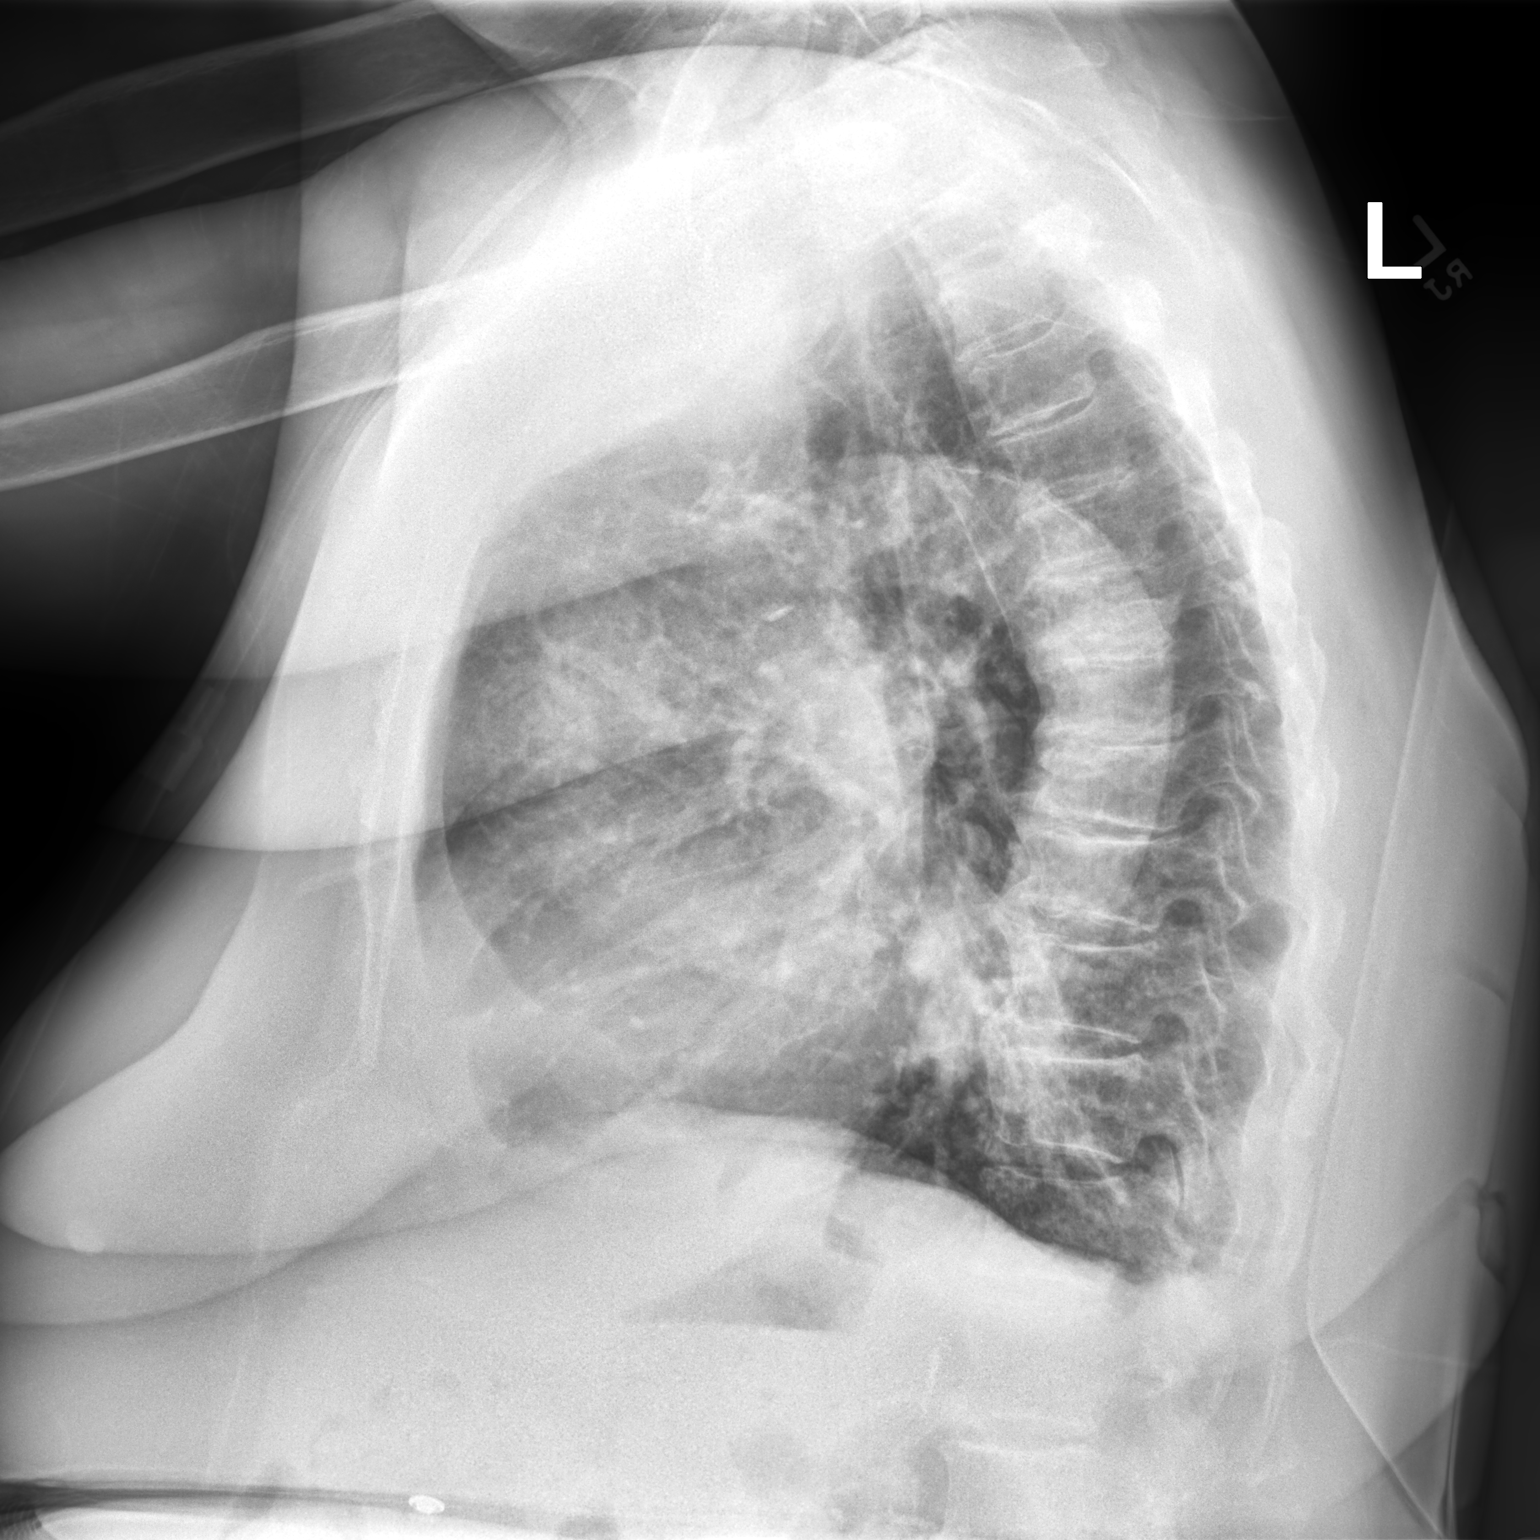

[2 of 2 positions shown; findings below may reference images not displayed]

FINDINGS: Lungs are hyperexpanded with emphysematous changes. No acute
consolidative airspace disease. No pleural effusions. No definite
suspicious appearing pulmonary nodules or masses are noted. No
pneumothorax. No evidence of pulmonary edema. Heart size is mildly
enlarged. Upper mediastinal contours are within normal limits.
Aortic atherosclerosis. Numerous old healed right-sided rib
fractures are incidentally noted.
IMPRESSION: 1. No radiographic evidence of acute cardiopulmonary disease.
2. Emphysema.
3. Mild cardiomegaly.
4. Aortic atherosclerosis.

## 2023-01-03 ENCOUNTER — Telehealth: Payer: Self-pay | Admitting: *Deleted

## 2023-01-03 MED ORDER — TRELEGY ELLIPTA 200-62.5-25 MCG/ACT IN AEPB: 1.0000 | INHALATION_SPRAY | Freq: Every day | RESPIRATORY_TRACT | Status: DC

## 2023-01-03 NOTE — Telephone Encounter (Signed)
Two samples of Trelegy 200 placed up front for patient to pick up, also provided an application for patient assistance for patient to fill out and return since patient is in the coverage gap.  Will await return of patient assistance forms.

## 2023-01-14 ENCOUNTER — Encounter (HOSPITAL_COMMUNITY): Payer: Self-pay

## 2023-01-14 ENCOUNTER — Other Ambulatory Visit: Payer: Self-pay

## 2023-01-14 ENCOUNTER — Emergency Department (HOSPITAL_COMMUNITY): Payer: Medicare Other

## 2023-01-14 ENCOUNTER — Inpatient Hospital Stay (HOSPITAL_COMMUNITY)
Admission: EM | Admit: 2023-01-14 | Discharge: 2023-01-19 | DRG: 981 | Disposition: A | Discharge status: Home Health | Payer: Medicare Other | Attending: Internal Medicine | Admitting: Internal Medicine

## 2023-01-14 DIAGNOSIS — Z888 Allergy status to other drugs, medicaments and biological substances status: Secondary | ICD-10-CM

## 2023-01-14 DIAGNOSIS — G894 Chronic pain syndrome: Secondary | ICD-10-CM | POA: Diagnosis not present

## 2023-01-14 DIAGNOSIS — J439 Emphysema, unspecified: Secondary | ICD-10-CM | POA: Diagnosis present

## 2023-01-14 DIAGNOSIS — K229 Disease of esophagus, unspecified: Secondary | ICD-10-CM | POA: Diagnosis present

## 2023-01-14 DIAGNOSIS — D509 Iron deficiency anemia, unspecified: Secondary | ICD-10-CM | POA: Diagnosis present

## 2023-01-14 DIAGNOSIS — Z8673 Personal history of transient ischemic attack (TIA), and cerebral infarction without residual deficits: Secondary | ICD-10-CM

## 2023-01-14 DIAGNOSIS — K5520 Angiodysplasia of colon without hemorrhage: Secondary | ICD-10-CM

## 2023-01-14 DIAGNOSIS — I5033 Acute on chronic diastolic (congestive) heart failure: Secondary | ICD-10-CM

## 2023-01-14 DIAGNOSIS — J9611 Chronic respiratory failure with hypoxia: Secondary | ICD-10-CM | POA: Diagnosis not present

## 2023-01-14 DIAGNOSIS — R911 Solitary pulmonary nodule: Secondary | ICD-10-CM

## 2023-01-14 DIAGNOSIS — R0602 Shortness of breath: Secondary | ICD-10-CM | POA: Diagnosis not present

## 2023-01-14 DIAGNOSIS — Z681 Body mass index (BMI) 19 or less, adult: Secondary | ICD-10-CM

## 2023-01-14 DIAGNOSIS — R069 Unspecified abnormalities of breathing: Secondary | ICD-10-CM | POA: Diagnosis not present

## 2023-01-14 DIAGNOSIS — Z7952 Long term (current) use of systemic steroids: Secondary | ICD-10-CM

## 2023-01-14 DIAGNOSIS — Z743 Need for continuous supervision: Secondary | ICD-10-CM | POA: Diagnosis not present

## 2023-01-14 DIAGNOSIS — I110 Hypertensive heart disease with heart failure: Secondary | ICD-10-CM | POA: Diagnosis not present

## 2023-01-14 DIAGNOSIS — Z9981 Dependence on supplemental oxygen: Secondary | ICD-10-CM | POA: Diagnosis not present

## 2023-01-14 DIAGNOSIS — J441 Chronic obstructive pulmonary disease with (acute) exacerbation: Principal | ICD-10-CM | POA: Diagnosis present

## 2023-01-14 DIAGNOSIS — E876 Hypokalemia: Secondary | ICD-10-CM | POA: Diagnosis present

## 2023-01-14 DIAGNOSIS — Z96643 Presence of artificial hip joint, bilateral: Secondary | ICD-10-CM | POA: Diagnosis present

## 2023-01-14 DIAGNOSIS — E871 Hypo-osmolality and hyponatremia: Secondary | ICD-10-CM | POA: Diagnosis present

## 2023-01-14 DIAGNOSIS — Z8249 Family history of ischemic heart disease and other diseases of the circulatory system: Secondary | ICD-10-CM | POA: Diagnosis not present

## 2023-01-14 DIAGNOSIS — I700 Atherosclerosis of aorta: Secondary | ICD-10-CM | POA: Diagnosis not present

## 2023-01-14 DIAGNOSIS — Z801 Family history of malignant neoplasm of trachea, bronchus and lung: Secondary | ICD-10-CM

## 2023-01-14 DIAGNOSIS — R0689 Other abnormalities of breathing: Secondary | ICD-10-CM | POA: Diagnosis not present

## 2023-01-14 DIAGNOSIS — K219 Gastro-esophageal reflux disease without esophagitis: Secondary | ICD-10-CM | POA: Diagnosis not present

## 2023-01-14 DIAGNOSIS — Z79899 Other long term (current) drug therapy: Secondary | ICD-10-CM

## 2023-01-14 DIAGNOSIS — D649 Anemia, unspecified: Secondary | ICD-10-CM

## 2023-01-14 DIAGNOSIS — F419 Anxiety disorder, unspecified: Secondary | ICD-10-CM | POA: Diagnosis present

## 2023-01-14 DIAGNOSIS — Z961 Presence of intraocular lens: Secondary | ICD-10-CM | POA: Diagnosis present

## 2023-01-14 DIAGNOSIS — R519 Headache, unspecified: Secondary | ICD-10-CM | POA: Diagnosis present

## 2023-01-14 DIAGNOSIS — Z86010 Personal history of colonic polyps: Secondary | ICD-10-CM

## 2023-01-14 DIAGNOSIS — Z7982 Long term (current) use of aspirin: Secondary | ICD-10-CM

## 2023-01-14 DIAGNOSIS — Z1152 Encounter for screening for COVID-19: Secondary | ICD-10-CM | POA: Diagnosis not present

## 2023-01-14 DIAGNOSIS — I517 Cardiomegaly: Secondary | ICD-10-CM | POA: Diagnosis not present

## 2023-01-14 DIAGNOSIS — I2510 Atherosclerotic heart disease of native coronary artery without angina pectoris: Secondary | ICD-10-CM | POA: Diagnosis not present

## 2023-01-14 DIAGNOSIS — K3189 Other diseases of stomach and duodenum: Secondary | ICD-10-CM | POA: Diagnosis present

## 2023-01-14 DIAGNOSIS — Z9851 Tubal ligation status: Secondary | ICD-10-CM

## 2023-01-14 DIAGNOSIS — B3781 Candidal esophagitis: Secondary | ICD-10-CM | POA: Diagnosis present

## 2023-01-14 DIAGNOSIS — R195 Other fecal abnormalities: Secondary | ICD-10-CM

## 2023-01-14 DIAGNOSIS — I272 Pulmonary hypertension, unspecified: Secondary | ICD-10-CM | POA: Diagnosis not present

## 2023-01-14 DIAGNOSIS — K264 Chronic or unspecified duodenal ulcer with hemorrhage: Secondary | ICD-10-CM | POA: Diagnosis not present

## 2023-01-14 DIAGNOSIS — T380X5A Adverse effect of glucocorticoids and synthetic analogues, initial encounter: Secondary | ICD-10-CM | POA: Diagnosis present

## 2023-01-14 DIAGNOSIS — Y92239 Unspecified place in hospital as the place of occurrence of the external cause: Secondary | ICD-10-CM | POA: Diagnosis present

## 2023-01-14 DIAGNOSIS — R6889 Other general symptoms and signs: Secondary | ICD-10-CM | POA: Diagnosis not present

## 2023-01-14 DIAGNOSIS — K31811 Angiodysplasia of stomach and duodenum with bleeding: Secondary | ICD-10-CM | POA: Diagnosis not present

## 2023-01-14 DIAGNOSIS — I10 Essential (primary) hypertension: Secondary | ICD-10-CM | POA: Diagnosis present

## 2023-01-14 DIAGNOSIS — R590 Localized enlarged lymph nodes: Secondary | ICD-10-CM | POA: Diagnosis not present

## 2023-01-14 DIAGNOSIS — K269 Duodenal ulcer, unspecified as acute or chronic, without hemorrhage or perforation: Secondary | ICD-10-CM | POA: Diagnosis not present

## 2023-01-14 DIAGNOSIS — K31819 Angiodysplasia of stomach and duodenum without bleeding: Secondary | ICD-10-CM | POA: Diagnosis not present

## 2023-01-14 DIAGNOSIS — J9621 Acute and chronic respiratory failure with hypoxia: Secondary | ICD-10-CM | POA: Insufficient documentation

## 2023-01-14 DIAGNOSIS — I5032 Chronic diastolic (congestive) heart failure: Secondary | ICD-10-CM | POA: Diagnosis not present

## 2023-01-14 DIAGNOSIS — Z87891 Personal history of nicotine dependence: Secondary | ICD-10-CM | POA: Diagnosis not present

## 2023-01-14 DIAGNOSIS — E785 Hyperlipidemia, unspecified: Secondary | ICD-10-CM | POA: Diagnosis not present

## 2023-01-14 DIAGNOSIS — D72829 Elevated white blood cell count, unspecified: Secondary | ICD-10-CM | POA: Diagnosis present

## 2023-01-14 DIAGNOSIS — Z9841 Cataract extraction status, right eye: Secondary | ICD-10-CM

## 2023-01-14 DIAGNOSIS — R918 Other nonspecific abnormal finding of lung field: Secondary | ICD-10-CM | POA: Diagnosis not present

## 2023-01-14 DIAGNOSIS — Z9842 Cataract extraction status, left eye: Secondary | ICD-10-CM

## 2023-01-14 DIAGNOSIS — M810 Age-related osteoporosis without current pathological fracture: Secondary | ICD-10-CM | POA: Diagnosis present

## 2023-01-14 DIAGNOSIS — M79603 Pain in arm, unspecified: Secondary | ICD-10-CM | POA: Diagnosis not present

## 2023-01-14 MED ORDER — ALBUTEROL SULFATE (2.5 MG/3ML) 0.083% IN NEBU
5.0000 mg | INHALATION_SOLUTION | Freq: Once | RESPIRATORY_TRACT | Status: AC
  Administered 2023-01-14: 5 mg via RESPIRATORY_TRACT
  Filled 2023-01-14: qty 6

## 2023-01-14 MED ORDER — IPRATROPIUM BROMIDE 0.02 % IN SOLN
0.5000 mg | Freq: Once | RESPIRATORY_TRACT | Status: AC
  Administered 2023-01-14: 0.5 mg via RESPIRATORY_TRACT
  Filled 2023-01-14: qty 2.5

## 2023-01-14 NOTE — ED Provider Notes (Signed)
[place] EMERGENCY DEPARTMENT AT [facility] Provider Note   CSN: [id number] Arrival date & time: 01/14/23  2307     History {Add pertinent medical, surgical, social history, OB history to HPI:1} Chief Complaint  Patient presents with   Respiratory Distress    SHAQUEEN PELLE is a 82 y.o. female.  The history is provided by the patient and medical records.  AMONEE DORWART is a 82 y.o. female who presents to the Emergency Department complaining of respiratory distress.  She presents to the emergency department by EMS for evaluation of difficulty breathing that started earlier today.  She has history of COPD and is on baseline 4 L nasal cannula.  She states that she is chronically short of breath but symptoms significantly worsened today with dyspnea on exertion.  She does report a mild cough productive of yellow sputum.  No fevers, chest pain, nausea, vomiting, diarrhea, abdominal pain.  She received 10 mg of albuterol, 1 mg of Atrovent, 125 mg Solu-Medrol by EMS prior to ED arrival.  At time of ED arrival she does report feeling improved.     Home Medications Prior to Admission medications   Medication Sig Start Date End Date Taking? Authorizing Provider  acetaminophen (TYLENOL) 325 MG tablet Take 2 tablets (650 mg total) by mouth every 6 (six) hours as needed for mild pain (or Fever >/= 101). 02/19/14   Perkins, Alexzandrew L, PA-C  albuterol (VENTOLIN HFA) 108 (90 Base) MCG/ACT inhaler INHALE 2 PUFFS INTO THE LUNGS EVERY 4 HOURS AS NEEDED FOR WHEEZING OR SHORTNESS OF BREATH. 11/01/21   Verlee Monte, MD  ALPRAZolam Prudy Feeler) 0.25 MG tablet TAKE 1 TABLET BY MOUTH TWICE A DAY AS NEEDED 10/04/22   Corwin Levins, MD  amLODipine (NORVASC) 10 MG tablet Take 1 tablet (10 mg total) by mouth daily. 11/14/22   Corwin Levins, MD  aspirin EC 81 MG tablet Take 81 mg by mouth daily. Swallow whole.    [provider]  cloNIDine (CATAPRES) 0.1 MG tablet TAKE 1 TABLET BY MOUTH TWICE A  DAY 11/23/22   Corwin Levins, MD  ferrous sulfate 325 (65 FE) MG EC tablet Take 325 mg by mouth daily with breakfast.    [provider]  Fexofenadine HCl (MUCINEX ALLERGY PO) Take by mouth as needed.    [provider]  fluocinolone (SYNALAR) 0.01 % external solution APPLY TOPICALLY 2 TIMES DAILY AS NEEDED. 07/03/22   Verlee Monte, MD  Fluticasone-Umeclidin-Vilant (TRELEGY ELLIPTA) 200-62.5-25 MCG/ACT AEPB Inhale 1 puff into the lungs daily. 03/08/22   Chilton Greathouse, MD  Fluticasone-Umeclidin-Vilant (TRELEGY ELLIPTA) 200-62.5-25 MCG/ACT AEPB Inhale 1 tablet into the lungs daily. 01/03/23   Chilton Greathouse, MD  furosemide (LASIX) 20 MG tablet TAKE 1 TABLET BY MOUTH IN THE  MORNING 09/26/22   Corwin Levins, MD  gabapentin (NEURONTIN) 300 MG capsule TAKE 2 CAPSULE BY MOUTH 3 TIMES  DAILY 09/18/22   Corwin Levins, MD  hydrALAZINE (APRESOLINE) 100 MG tablet Take 1 tablet (100 mg total) by mouth 3 (three) times daily. 12/25/22   Corwin Levins, MD  iron polysaccharides (NU-IRON) 150 MG capsule Take 1 capsule (150 mg total) by mouth daily. 11/10/22   Corwin Levins, MD  losartan (COZAAR) 100 MG tablet Take 1 tablet (100 mg total) by mouth daily. 11/14/22   Corwin Levins, MD  lovastatin (MEVACOR) 40 MG tablet TAKE 1 TABLET BY MOUTH AT  BEDTIME 06/08/22   Corwin Levins,  MD  meclizine (ANTIVERT) 12.5 MG tablet TAKE 1 TABLET BY MOUTH 3 TIMES A DAY AS NEEDED FOR DIZZINESS 06/22/21   Corwin Levins, MD  nystatin (MYCOSTATIN) 100000 UNIT/ML suspension Swish and spit 5mL two times daily after using Symbicort. 11/09/22   Corwin Levins, MD  pantoprazole (PROTONIX) 40 MG tablet TAKE 1 TABLET BY MOUTH DAILY 06/26/22   Corwin Levins, MD  predniSONE (DELTASONE) 20 MG tablet Take 2 tablets (40 mg total) by mouth daily with breakfast. 07/01/21   Myrlene Broker, MD  solifenacin (VESICARE) 10 MG tablet Take 1 tablet (10 mg total) by mouth daily. 11/23/22   Corwin Levins, MD  Spacer/Aero-Holding [facility] Use as  directed with inhaler. 09/13/21   Verlee Monte, MD  sucralfate (CARAFATE) 1 g tablet Take 1 tablet (1 g total) by mouth 4 (four) times daily -  with meals and at bedtime. 12/01/20   Corwin Levins, MD  tiZANidine (ZANAFLEX) 4 MG tablet TAKE 1 TABLET BY MOUTH EVERY 6 HOURS AS NEEDED FOR MUSCLE SPASMS. 11/23/22   Corwin Levins, MD  traMADol (ULTRAM) 50 MG tablet Take 1 tablet (50 mg total) by mouth every 6 (six) hours as needed. for pain 12/25/22   Corwin Levins, MD  traZODone (DESYREL) 50 MG tablet TAKE 1/2 TO 1 TABLET BY MOUTH AT BEDTIME AS NEEDED FOR SLEEP 11/23/22   Corwin Levins, MD  TRELEGY ELLIPTA 200-62.5-25 MCG/ACT AEPB INHALE 1 PUFF BY MOUTH EVERY DAY 11/03/22   Mannam, Colbert Coyer, MD  triamcinolone cream (KENALOG) 0.1 % APPLY TO AFFECTED AREA TWICE A DAY 07/10/22   Corwin Levins, MD      Allergies    Chlorthalidone    Review of Systems   Review of Systems  All other systems reviewed and are negative.   Physical Exam Updated Vital Signs SpO2 100% Comment: DuoNeb Physical Exam Vitals and nursing note reviewed.  Constitutional:      Appearance: She is well-developed.  HENT:     Head: Normocephalic and atraumatic.  Cardiovascular:     Rate and Rhythm: Normal rate and regular rhythm.     Heart sounds: No murmur heard. Pulmonary:     Effort: Pulmonary effort is normal. No respiratory distress.     Comments: Mild tachypnea.  Decreased air movement in all lung fields, greatest in the bases bilaterally Abdominal:     Palpations: Abdomen is soft.     Tenderness: There is no abdominal tenderness. There is no guarding or rebound.  Musculoskeletal:        General: No tenderness.     Comments: Trace pitting edema to bilateral lower extremities.  Skin:    General: Skin is warm and dry.  Neurological:     Mental Status: She is alert and oriented to person, place, and time.  Psychiatric:        Behavior: Behavior normal.     ED Results / Procedures / Treatments   Labs (all labs ordered  are listed, but only abnormal results are displayed) Labs Reviewed  SARS CORONAVIRUS 2 BY RT PCR  CBC  COMPREHENSIVE METABOLIC PANEL  BRAIN NATRIURETIC PEPTIDE  TROPONIN I (HIGH SENSITIVITY)    EKG EKG Interpretation Date/Time:  Sunday January 14 2023 23:26:29 EDT Ventricular Rate:  85 PR Interval:  152 QRS Duration:  89 QT Interval:  363 QTC Calculation: 432 R Axis:   23  Text Interpretation: Sinus rhythm Confirmed by Tilden Fossa [phone]) on 01/14/2023 11:30:49 PM  Radiology No results found.  Procedures Procedures  {Document cardiac monitor, telemetry assessment procedure when appropriate:1}  Medications Ordered in ED Medications  albuterol (PROVENTIL) (2.5 MG/3ML) 0.083% nebulizer solution 5 mg (5 mg Nebulization Given 01/14/23 2326)  ipratropium (ATROVENT) nebulizer solution 0.5 mg (0.5 mg Nebulization Given 01/14/23 2326)    ED Course/ Medical Decision Making/ A&P   {   Click here for ABCD2, HEART and other calculatorsREFRESH Note before signing :1}                              Medical Decision Making  Patient with history of hypertension, COPD, CHF, chronic anemia here for evaluation of acute on chronic shortness of breath.  {Document critical care time when appropriate:1} {Document review of labs and clinical decision tools ie heart score, Chads2Vasc2 etc:1}  {Document your independent review of radiology images, and any outside records:1} {Document your discussion with family members, caretakers, and with consultants:1} {Document social determinants of health affecting pt's care:1} {Document your decision making why or why not admission, treatments were needed:1} Final Clinical Impression(s) / ED Diagnoses Final diagnoses:  None    Rx / DC Orders ED Discharge Orders     None

## 2023-01-14 NOTE — ED Triage Notes (Signed)
Pt BIBA after having respiratory distress. O2 91% on 4L [place] (Chronic/Baseline) when EMS arrived. Pt states that her SOB has been going on since 0800 this am and just progressively got worse so daughter called EMS.. Pt received 10mg  Albuterol, 1mg  Atrovent, and 125mg  of Solu-medrol with medics.

## 2023-01-15 ENCOUNTER — Emergency Department (HOSPITAL_COMMUNITY): Payer: Medicare Other

## 2023-01-15 ENCOUNTER — Other Ambulatory Visit: Payer: Self-pay | Admitting: Radiology

## 2023-01-15 DIAGNOSIS — R918 Other nonspecific abnormal finding of lung field: Secondary | ICD-10-CM | POA: Diagnosis not present

## 2023-01-15 DIAGNOSIS — I2510 Atherosclerotic heart disease of native coronary artery without angina pectoris: Secondary | ICD-10-CM | POA: Diagnosis not present

## 2023-01-15 DIAGNOSIS — R590 Localized enlarged lymph nodes: Secondary | ICD-10-CM | POA: Diagnosis not present

## 2023-01-15 DIAGNOSIS — B3781 Candidal esophagitis: Secondary | ICD-10-CM | POA: Diagnosis present

## 2023-01-15 DIAGNOSIS — R0602 Shortness of breath: Secondary | ICD-10-CM | POA: Diagnosis not present

## 2023-01-15 DIAGNOSIS — I700 Atherosclerosis of aorta: Secondary | ICD-10-CM | POA: Diagnosis not present

## 2023-01-15 DIAGNOSIS — G894 Chronic pain syndrome: Secondary | ICD-10-CM | POA: Diagnosis present

## 2023-01-15 DIAGNOSIS — I5033 Acute on chronic diastolic (congestive) heart failure: Secondary | ICD-10-CM

## 2023-01-15 DIAGNOSIS — E871 Hypo-osmolality and hyponatremia: Secondary | ICD-10-CM | POA: Diagnosis present

## 2023-01-15 DIAGNOSIS — I5032 Chronic diastolic (congestive) heart failure: Secondary | ICD-10-CM

## 2023-01-15 DIAGNOSIS — I272 Pulmonary hypertension, unspecified: Secondary | ICD-10-CM | POA: Diagnosis present

## 2023-01-15 DIAGNOSIS — K5900 Constipation, unspecified: Secondary | ICD-10-CM | POA: Diagnosis not present

## 2023-01-15 DIAGNOSIS — R911 Solitary pulmonary nodule: Secondary | ICD-10-CM | POA: Diagnosis present

## 2023-01-15 DIAGNOSIS — I517 Cardiomegaly: Secondary | ICD-10-CM | POA: Diagnosis not present

## 2023-01-15 DIAGNOSIS — K31811 Angiodysplasia of stomach and duodenum with bleeding: Secondary | ICD-10-CM | POA: Diagnosis present

## 2023-01-15 DIAGNOSIS — J439 Emphysema, unspecified: Secondary | ICD-10-CM | POA: Diagnosis present

## 2023-01-15 DIAGNOSIS — R195 Other fecal abnormalities: Secondary | ICD-10-CM | POA: Diagnosis not present

## 2023-01-15 DIAGNOSIS — Y92239 Unspecified place in hospital as the place of occurrence of the external cause: Secondary | ICD-10-CM | POA: Diagnosis present

## 2023-01-15 DIAGNOSIS — Z87891 Personal history of nicotine dependence: Secondary | ICD-10-CM | POA: Diagnosis not present

## 2023-01-15 DIAGNOSIS — F419 Anxiety disorder, unspecified: Secondary | ICD-10-CM | POA: Diagnosis present

## 2023-01-15 DIAGNOSIS — E876 Hypokalemia: Secondary | ICD-10-CM | POA: Diagnosis present

## 2023-01-15 DIAGNOSIS — J441 Chronic obstructive pulmonary disease with (acute) exacerbation: Principal | ICD-10-CM

## 2023-01-15 DIAGNOSIS — K269 Duodenal ulcer, unspecified as acute or chronic, without hemorrhage or perforation: Secondary | ICD-10-CM | POA: Diagnosis not present

## 2023-01-15 DIAGNOSIS — Z8673 Personal history of transient ischemic attack (TIA), and cerebral infarction without residual deficits: Secondary | ICD-10-CM | POA: Diagnosis not present

## 2023-01-15 DIAGNOSIS — J9611 Chronic respiratory failure with hypoxia: Secondary | ICD-10-CM | POA: Diagnosis present

## 2023-01-15 DIAGNOSIS — I5089 Other heart failure: Secondary | ICD-10-CM | POA: Diagnosis not present

## 2023-01-15 DIAGNOSIS — D509 Iron deficiency anemia, unspecified: Secondary | ICD-10-CM | POA: Diagnosis not present

## 2023-01-15 DIAGNOSIS — I5022 Chronic systolic (congestive) heart failure: Secondary | ICD-10-CM | POA: Diagnosis not present

## 2023-01-15 DIAGNOSIS — K219 Gastro-esophageal reflux disease without esophagitis: Secondary | ICD-10-CM | POA: Diagnosis present

## 2023-01-15 DIAGNOSIS — J449 Chronic obstructive pulmonary disease, unspecified: Secondary | ICD-10-CM | POA: Diagnosis not present

## 2023-01-15 DIAGNOSIS — K264 Chronic or unspecified duodenal ulcer with hemorrhage: Secondary | ICD-10-CM | POA: Diagnosis present

## 2023-01-15 DIAGNOSIS — I110 Hypertensive heart disease with heart failure: Secondary | ICD-10-CM | POA: Diagnosis not present

## 2023-01-15 DIAGNOSIS — K921 Melena: Secondary | ICD-10-CM | POA: Diagnosis not present

## 2023-01-15 DIAGNOSIS — D649 Anemia, unspecified: Secondary | ICD-10-CM

## 2023-01-15 DIAGNOSIS — Z1152 Encounter for screening for COVID-19: Secondary | ICD-10-CM | POA: Diagnosis not present

## 2023-01-15 DIAGNOSIS — E785 Hyperlipidemia, unspecified: Secondary | ICD-10-CM | POA: Diagnosis present

## 2023-01-15 DIAGNOSIS — K31819 Angiodysplasia of stomach and duodenum without bleeding: Secondary | ICD-10-CM | POA: Diagnosis not present

## 2023-01-15 DIAGNOSIS — K2950 Unspecified chronic gastritis without bleeding: Secondary | ICD-10-CM | POA: Diagnosis not present

## 2023-01-15 DIAGNOSIS — Z9981 Dependence on supplemental oxygen: Secondary | ICD-10-CM | POA: Diagnosis not present

## 2023-01-15 DIAGNOSIS — Z8249 Family history of ischemic heart disease and other diseases of the circulatory system: Secondary | ICD-10-CM | POA: Diagnosis not present

## 2023-01-15 DIAGNOSIS — Z681 Body mass index (BMI) 19 or less, adult: Secondary | ICD-10-CM | POA: Diagnosis not present

## 2023-01-15 DIAGNOSIS — K229 Disease of esophagus, unspecified: Secondary | ICD-10-CM | POA: Diagnosis not present

## 2023-01-15 LAB — CBC
HCT: 31 % — ABNORMAL LOW (ref 36.0–46.0)
HCT: 31.4 % — ABNORMAL LOW (ref 36.0–46.0)
Hemoglobin: 9.3 g/dL — ABNORMAL LOW (ref 12.0–15.0)
Hemoglobin: 9 g/dL — ABNORMAL LOW (ref 12.0–15.0)
MCH: 25.7 pg — ABNORMAL LOW (ref 26.0–34.0)
MCH: 25.1 pg — ABNORMAL LOW (ref 26.0–34.0)
MCHC: 30 g/dL (ref 30.0–36.0)
MCHC: 28.7 g/dL — ABNORMAL LOW (ref 30.0–36.0)
MCV: 85.6 fL (ref 80.0–100.0)
MCV: 87.7 fL (ref 80.0–100.0)
Platelets: 346 10*3/uL (ref 150–400)
Platelets: 337 10*3/uL (ref 150–400)
RBC: 3.62 MIL/uL — ABNORMAL LOW (ref 3.87–5.11)
RBC: 3.58 MIL/uL — ABNORMAL LOW (ref 3.87–5.11)
RDW: 14.6 % (ref 11.5–15.5)
RDW: 14.7 % (ref 11.5–15.5)
WBC: 8.9 10*3/uL (ref 4.0–10.5)
WBC: 6.8 10*3/uL (ref 4.0–10.5)
nRBC: 0 % (ref 0.0–0.2)
nRBC: 0 % (ref 0.0–0.2)

## 2023-01-15 LAB — COMPREHENSIVE METABOLIC PANEL
ALT: 13 U/L (ref 0–44)
AST: 15 U/L (ref 15–41)
Albumin: 3.7 g/dL (ref 3.5–5.0)
Alkaline Phosphatase: 66 U/L (ref 38–126)
Anion gap: 6 (ref 5–15)
BUN: 14 mg/dL (ref 8–23)
CO2: 32 mmol/L (ref 22–32)
Calcium: 10.2 mg/dL (ref 8.9–10.3)
Chloride: 98 mmol/L (ref 98–111)
Creatinine, Ser: 0.99 mg/dL (ref 0.44–1.00)
GFR, Estimated: 57 mL/min — ABNORMAL LOW (ref >60)
Glucose, Bld: 127 mg/dL — ABNORMAL HIGH (ref 70–99)
Potassium: 3.4 mmol/L — ABNORMAL LOW (ref 3.5–5.1)
Sodium: 136 mmol/L (ref 135–145)
Total Bilirubin: 0.5 mg/dL (ref 0.3–1.2)
Total Protein: 8 g/dL (ref 6.5–8.1)

## 2023-01-15 LAB — BASIC METABOLIC PANEL
Anion gap: 9 (ref 5–15)
BUN: 14 mg/dL (ref 8–23)
CO2: 26 mmol/L (ref 22–32)
Calcium: 10.6 mg/dL — ABNORMAL HIGH (ref 8.9–10.3)
Chloride: 100 mmol/L (ref 98–111)
Creatinine, Ser: 1.02 mg/dL — ABNORMAL HIGH (ref 0.44–1.00)
GFR, Estimated: 55 mL/min — ABNORMAL LOW (ref >60)
Glucose, Bld: 145 mg/dL — ABNORMAL HIGH (ref 70–99)
Potassium: 4.1 mmol/L (ref 3.5–5.1)
Sodium: 135 mmol/L (ref 135–145)

## 2023-01-15 LAB — MAGNESIUM: Magnesium: 1.9 mg/dL (ref 1.7–2.4)

## 2023-01-15 LAB — TROPONIN I (HIGH SENSITIVITY): Troponin I (High Sensitivity): 4 ng/L (ref <18)

## 2023-01-15 LAB — BRAIN NATRIURETIC PEPTIDE: B Natriuretic Peptide: 40.9 pg/mL (ref 0.0–100.0)

## 2023-01-15 LAB — SARS CORONAVIRUS 2 BY RT PCR: SARS Coronavirus 2 by RT PCR: NEGATIVE

## 2023-01-15 MED ORDER — METHYLPREDNISOLONE SODIUM SUCC 40 MG IJ SOLR
40.0000 mg | Freq: Two times a day (BID) | INTRAMUSCULAR | Status: DC
  Administered 2023-01-15 – 2023-01-16 (×3): 40 mg via INTRAVENOUS
  Filled 2023-01-15 (×3): qty 1

## 2023-01-15 MED ORDER — ALPRAZOLAM 0.25 MG PO TABS
0.2500 mg | ORAL_TABLET | Freq: Two times a day (BID) | ORAL | Status: DC | PRN
  Administered 2023-01-16 – 2023-01-19 (×6): 0.25 mg via ORAL
  Filled 2023-01-15 (×6): qty 1

## 2023-01-15 MED ORDER — ACETAMINOPHEN-CAFFEINE 500-65 MG PO TABS: 1.0000 | ORAL_TABLET | Freq: Once | ORAL | Status: DC

## 2023-01-15 MED ORDER — IOHEXOL 350 MG/ML SOLN
75.0000 mL | Freq: Once | INTRAVENOUS | Status: AC | PRN
  Administered 2023-01-15: 75 mL via INTRAVENOUS

## 2023-01-15 MED ORDER — IPRATROPIUM-ALBUTEROL 0.5-2.5 (3) MG/3ML IN SOLN
3.0000 mL | Freq: Once | RESPIRATORY_TRACT | Status: AC
  Administered 2023-01-15: 3 mL via RESPIRATORY_TRACT
  Filled 2023-01-15: qty 3

## 2023-01-15 MED ORDER — ALBUTEROL SULFATE (2.5 MG/3ML) 0.083% IN NEBU: 2.5000 mg | INHALATION_SOLUTION | RESPIRATORY_TRACT | Status: DC | PRN

## 2023-01-15 MED ORDER — AMLODIPINE BESYLATE 5 MG PO TABS
10.0000 mg | ORAL_TABLET | Freq: Every day | ORAL | Status: DC
  Administered 2023-01-15 – 2023-01-19 (×5): 10 mg via ORAL
  Filled 2023-01-15 (×5): qty 2

## 2023-01-15 MED ORDER — ASPIRIN-ACETAMINOPHEN-CAFFEINE 250-250-65 MG PO TABS
1.0000 | ORAL_TABLET | Freq: Once | ORAL | Status: AC
  Administered 2023-01-15: 1 via ORAL
  Filled 2023-01-15: qty 1

## 2023-01-15 MED ORDER — TRAMADOL HCL 50 MG PO TABS
50.0000 mg | ORAL_TABLET | Freq: Two times a day (BID) | ORAL | Status: DC | PRN
  Administered 2023-01-15 – 2023-01-17 (×3): 50 mg via ORAL
  Filled 2023-01-15 (×3): qty 1

## 2023-01-15 MED ORDER — GUAIFENESIN ER 600 MG PO TB12
600.0000 mg | ORAL_TABLET | Freq: Two times a day (BID) | ORAL | Status: DC
  Administered 2023-01-15 – 2023-01-19 (×9): 600 mg via ORAL
  Filled 2023-01-15 (×9): qty 1

## 2023-01-15 MED ORDER — GUAIFENESIN-DM 100-10 MG/5ML PO SYRP: 5.0000 mL | ORAL_SOLUTION | ORAL | Status: DC | PRN

## 2023-01-15 MED ORDER — CLONIDINE HCL 0.1 MG PO TABS
0.1000 mg | ORAL_TABLET | Freq: Two times a day (BID) | ORAL | Status: DC
  Administered 2023-01-15 – 2023-01-19 (×8): 0.1 mg via ORAL
  Filled 2023-01-15 (×8): qty 1

## 2023-01-15 MED ORDER — IPRATROPIUM-ALBUTEROL 0.5-2.5 (3) MG/3ML IN SOLN
3.0000 mL | Freq: Three times a day (TID) | RESPIRATORY_TRACT | Status: DC
  Administered 2023-01-15 – 2023-01-19 (×10): 3 mL via RESPIRATORY_TRACT
  Filled 2023-01-15 (×10): qty 3

## 2023-01-15 MED ORDER — PANTOPRAZOLE SODIUM 40 MG PO TBEC
40.0000 mg | DELAYED_RELEASE_TABLET | Freq: Every day | ORAL | Status: DC
  Administered 2023-01-15 – 2023-01-16 (×2): 40 mg via ORAL
  Filled 2023-01-15 (×2): qty 1

## 2023-01-15 MED ORDER — LOSARTAN POTASSIUM 50 MG PO TABS
100.0000 mg | ORAL_TABLET | Freq: Every day | ORAL | Status: DC
  Administered 2023-01-15 – 2023-01-19 (×5): 100 mg via ORAL
  Filled 2023-01-15 (×5): qty 2

## 2023-01-15 MED ORDER — ACETAMINOPHEN 650 MG RE SUPP: 650.0000 mg | Freq: Four times a day (QID) | RECTAL | Status: DC | PRN

## 2023-01-15 MED ORDER — ENOXAPARIN SODIUM 30 MG/0.3ML IJ SOSY
30.0000 mg | PREFILLED_SYRINGE | INTRAMUSCULAR | Status: DC
  Administered 2023-01-15 – 2023-01-16 (×2): 30 mg via SUBCUTANEOUS
  Filled 2023-01-15 (×2): qty 0.3

## 2023-01-15 MED ORDER — ACETAMINOPHEN 325 MG PO TABS
650.0000 mg | ORAL_TABLET | Freq: Four times a day (QID) | ORAL | Status: DC | PRN
  Administered 2023-01-15 – 2023-01-18 (×5): 650 mg via ORAL
  Filled 2023-01-15 (×5): qty 2

## 2023-01-15 MED ORDER — LABETALOL HCL 5 MG/ML IV SOLN
5.0000 mg | INTRAVENOUS | Status: DC | PRN
  Administered 2023-01-15 – 2023-01-19 (×10): 5 mg via INTRAVENOUS
  Filled 2023-01-15 (×10): qty 4

## 2023-01-15 MED ORDER — POTASSIUM CHLORIDE CRYS ER 20 MEQ PO TBCR
40.0000 meq | EXTENDED_RELEASE_TABLET | Freq: Once | ORAL | Status: AC
  Administered 2023-01-15: 40 meq via ORAL
  Filled 2023-01-15: qty 2

## 2023-01-15 MED ORDER — DOXYCYCLINE HYCLATE 100 MG PO TABS
100.0000 mg | ORAL_TABLET | Freq: Two times a day (BID) | ORAL | Status: DC
  Administered 2023-01-15 – 2023-01-18 (×8): 100 mg via ORAL
  Filled 2023-01-15 (×8): qty 1

## 2023-01-15 MED ORDER — PRAVASTATIN SODIUM 40 MG PO TABS
40.0000 mg | ORAL_TABLET | Freq: Every day | ORAL | Status: DC
  Administered 2023-01-15 – 2023-01-18 (×4): 40 mg via ORAL
  Filled 2023-01-15 (×4): qty 1

## 2023-01-15 MED ORDER — IPRATROPIUM-ALBUTEROL 0.5-2.5 (3) MG/3ML IN SOLN
3.0000 mL | Freq: Three times a day (TID) | RESPIRATORY_TRACT | Status: DC
  Administered 2023-01-15: 3 mL via RESPIRATORY_TRACT
  Filled 2023-01-15: qty 3

## 2023-01-15 MED ORDER — ASPIRIN 81 MG PO TBEC
81.0000 mg | DELAYED_RELEASE_TABLET | Freq: Every day | ORAL | Status: DC
  Filled 2023-01-15: qty 1

## 2023-01-15 MED ORDER — ASPIRIN 81 MG PO TBEC
81.0000 mg | DELAYED_RELEASE_TABLET | Freq: Every day | ORAL | Status: DC
  Administered 2023-01-16: 81 mg via ORAL
  Filled 2023-01-15: qty 1

## 2023-01-15 NOTE — Progress Notes (Signed)
   Patient seen and examined at bedside, patient admitted after midnight, please see earlier detailed admission note by John Giovanni, MD. Briefly, patient presented secondary to shortness of breath and found to have evidence of a COPD exacerbation. Solu-medrol and albuterol started.  Subjective: Patient reports no dyspnea at rest but still with dyspnea with mobility. Headache located in frontal region.   BP (!) 181/66   Pulse 100   Temp 98.2 F (36.8 C) (Oral)   Resp 20   Ht 5\' 4"  (1.626 m)   Wt 41.7 kg   SpO2 96%   BMI 15.78 kg/m   General exam: Appears calm and comfortable Respiratory system: Clear/diminished to auscultation. Respiratory effort normal. Cardiovascular system: S1 & S2 heard, RRR. Gastrointestinal system: Abdomen is nondistended, soft and nontender. Normal bowel sounds heard. Central nervous system: Alert and oriented. No focal neurological deficits. Musculoskeletal: BLE edema. No calf tenderness Psychiatry: Judgement and insight appear normal. Mood & affect appropriate.   Brief assessment/Plan:  COPD exacerbation Patient with dyspnea on exertion with productive cough. Patient started on steroids and breathing treatments. Wheezing improved but still with functional deficits. -Continue Solu-medrol -Continue albuterol PRN -Incentive spirometer and flutter valve -PT/OT evals  Chronic respiratory failure with hypoxia Patient is currently at baseline oxygen. -Continue oxygen supplementation at 4 L/min  Pulmonary nodule Incidental measuring 1.4 cm located in the posteromedial right lower lobe seen on CT imaging. Recommendation for repeat imaging in three months. -Per radiology: Repeat chest CT or Follow-up PET-CT or Tissue sampling -Will recommend pulmonology follow-up as an outpatient  Hypokalemia Mild. Resolved with repletion.  Primary hypertension -Continue amlodipine and losartan  Chronic diastolic heart failure Stable. Euvolemic.  Pulmonary  hypertension Noted.  Anxiety -Continue Xanax PRN  Chronic pain syndrome -Continue Tramadol  GERD -Protonix  Hyperlipidemia -Pravastatin while inpatient  History of stroke/TIA Noted. -Continue aspirin   Family communication: Daughter at bedside DVT prophylaxis: Lovenox Disposition: Discharge home likely in 24 hours once dyspnea improved  Jacquelin Hawking, MD [facility] 01/15/2023, 8:17 AM

## 2023-01-15 NOTE — ED Notes (Signed)
Pt's daughter Lady Gary called and given update on Pt being transferred to a room 1408 per Pt's request.

## 2023-01-15 NOTE — Progress Notes (Signed)
Mobility Specialist - Progress Note  Pre-mobility: 91% SpO2 During mobility: 80% SpO2 Post-mobility: 90% SPO2   01/15/23 1401  Mobility  Activity Transferred to/from [facility];Ambulated with assistance in room  Level of Assistance Minimal assist, patient does 75% or more  Assistive Device Front wheel walker  Distance Ambulated (ft) 10 ft  Range of Motion/Exercises Active Assistive  $Mobility charge 1 Mobility  Mobility Specialist Start Time (ACUTE ONLY) 1345  Mobility Specialist Stop Time (ACUTE ONLY) 1401  Mobility Specialist Time Calculation (min) (ACUTE ONLY) 16 min   Pt was found in bed needing assistance to [facility]. Pt was min-A for bed mobility and STS. Pt able to transfer to [facility] and afterwards sat EOB. SPO2 checked to be 80% upon returning to sit EOB. SPO2 increased >91% after ~31min. Afterwards pt able to ambulate to door and back to bed. SPO2 dropped to 80% again and encouraged pt on pursed lip breathing. Pt able to increase SPO2 to 90% <38min. At EOS returned to bed with all needs met. Call bell in reach and family in room. RN notified.   Billey Chang Mobility Specialist

## 2023-01-15 NOTE — Plan of Care (Signed)
  Problem: Health Behavior/Discharge Planning: Goal: Ability to manage health-related needs will improve Outcome: Progressing   Problem: Clinical Measurements: Goal: Will remain free from infection Outcome: Progressing Goal: Diagnostic test results will improve Outcome: Progressing   Problem: Coping: Goal: Level of anxiety will decrease Outcome: Progressing   Problem: Elimination: Goal: Will not experience complications related to urinary retention Outcome: Progressing

## 2023-01-15 NOTE — ED Notes (Signed)
Pt ambulated in hallway and back to room. O2 Sats dropped into low 80's, then placed on 6L [place] and Sats only increased to mid 80's. Rees,MD made aware. Will continue to monitor and titrate O2 prn.

## 2023-01-15 NOTE — H&P (Signed)
History and Physical    Wendy Torres NWG:[id number] DOB: 09-03-1940 DOA: 01/14/2023  PCP: Corwin Levins, MD  Patient coming from: Home  Chief Complaint: Shortness of breath  HPI: Wendy Torres is a 82 y.o. female with medical history significant of hypertension, COPD on 4 L home oxygen, former cigarette smoker, pulmonary hypertension, chronic HFpEF, anxiety, chronic pain syndrome, GERD, hiatal hernia, hyperlipidemia, stroke/TIA presented to ED for evaluation of shortness of breath.  She was given albuterol 10 mg, Atrovent 1 mg, and Solu-Medrol 125 mg by EMS.  In the ED, stable on 4 L [place] at rest but sats dropped to the 80s with ambulation.  Hypertensive with systolic in the 170s to 180s in the ED.  Afebrile.  Labs showing no leukocytosis, hemoglobin 9.3 (baseline 10-11), MCV 85.6, potassium 3.4, BNP normal, troponin negative, SARS-CoV-2 PCR negative.  CTA chest showing: "IMPRESSION: 1. Negative for acute pulmonary embolism. 2. 1.4 cm subpleural nodule along the posteromedial right lower lobe may be due to rounded atelectasis however mass is not excluded. Consider one of the following in 3 months for both low-risk and high-risk individuals: (a) repeat chest CT, (b) follow-up PET-CT, or (c) tissue sampling. This recommendation follows the consensus statement: Guidelines for Management of Incidental Pulmonary Nodules Detected on CT Images: From the Fleischner Society 2017; Radiology 2017; 284:228-243. 3. Enlarged mediastinal and hilar lymph nodes. Continued attention on follow-up. 4. Aortic Atherosclerosis (ICD10-I70.0) and Emphysema (ICD10-J43.9)."  Patient was given DuoNeb treatment in the ED.  [facility] called to admit for acute COPD exacerbation.  Patient states she started having shortness of breath yesterday morning.  Endorsing dyspnea on exertion.  She is using her home COPD medications but does not remember the names as both her daughters help her with her medications.  She has also  been using her rescue inhaler.  Endorsing slight dry cough.  Denies fevers or chest pain.  She is on continuous 4 L home oxygen.  Patient reports improvement of her dyspnea after receiving treatments by EMS and in the ED.  Currently resting comfortably.  She has no other complaints.  Review of Systems:  Review of Systems  All other systems reviewed and are negative.   Past Medical History:  Diagnosis Date   ABNORMAL CHEST XRAY 11/05/2008   ACUTE URIS OF UNSPECIFIED SITE 10/22/2007   ALLERGIC RHINITIS 12/09/2006   ANEMIA 01/14/2009   Anxiety    Arthritis    ASTHMA 12/09/2006   Asthma    Cataract    bilateral -removed   CHF (congestive heart failure) (HCC)    Chronic pain syndrome 02/16/2009   COPD 12/09/2006   DEGENERATIVE JOINT DISEASE 12/09/2006   DIZZINESS 03/30/2010   DYSPNEA 01/27/2010   with heavy exertion   FREQUENCY, URINARY 03/30/2010   GERD 12/09/2006   Glossitis 01/14/2009   HELICOBACTER PYLORI GASTRITIS, HX OF 12/09/2006   HIATAL HERNIA 03/01/2010   History of hiatal hernia    HYPERLIPIDEMIA 04/18/2007   HYPERSOMNIA 08/31/2008   HYPERTENSION 12/09/2006   INSOMNIA-SLEEP DISORDER-UNSPEC 11/05/2008   LOW BACK PAIN 12/09/2006   Morbid obesity (HCC) 12/09/2006   Nonspecific (abnormal) findings on radiological and other examination of body structure 11/05/2008   OSTEOPOROSIS 12/09/2006   OVERACTIVE BLADDER 04/14/2008   PALPITATIONS, RECURRENT 01/27/2010   Stroke (HCC)    pt. stated light one years ago   SYNCOPE 11/05/2008   TRANSIENT ISCHEMIC ATTACK, HX OF 12/09/2006    Past Surgical History:  Procedure Laterality Date   COLONOSCOPY  LUMBAR DISC SURGERY     TONSILLECTOMY     as a child   TOTAL HIP ARTHROPLASTY Right 11/06/2012   Procedure: RIGHT TOTAL HIP ARTHROPLASTY WITH ACETABULAR AUTOGRAFT;  Surgeon: Loanne Drilling, MD;  Location: [facility];  Service: Orthopedics;  Laterality: Right;   TOTAL HIP ARTHROPLASTY Left 02/18/2014   Procedure: LEFT TOTAL HIP  ARTHROPLASTY;  Surgeon: Loanne Drilling, MD;  Location: [facility];  Service: Orthopedics;  Laterality: Left;   TUBAL LIGATION       reports that she quit smoking about 50 years ago. Her smoking use included cigarettes. She started smoking about 75 years ago. She has a 12.5 pack-year smoking history. She has never used smokeless tobacco. She reports that she does not drink alcohol and does not use drugs.  Allergies  Allergen Reactions   Chlorthalidone     Family History  Problem Relation Age of Onset   Heart disease Mother        Had pacemaker   Lung cancer Father    Hypertension Brother    Diabetes Neg Hx    Colon cancer Neg Hx    Colon polyps Neg Hx    Kidney disease Neg Hx    Gallbladder disease Neg Hx    Esophageal cancer Neg Hx    Allergic rhinitis Neg Hx    Angioedema Neg Hx    Asthma Neg Hx    Atopy Neg Hx    Eczema Neg Hx    Immunodeficiency Neg Hx    Urticaria Neg Hx     Prior to Admission medications   Medication Sig Start Date End Date Taking? Authorizing Provider  acetaminophen (TYLENOL) 325 MG tablet Take 2 tablets (650 mg total) by mouth every 6 (six) hours as needed for mild pain (or Fever >/= 101). 02/19/14   Perkins, Alexzandrew L, PA-C  albuterol (VENTOLIN HFA) 108 (90 Base) MCG/ACT inhaler INHALE 2 PUFFS INTO THE LUNGS EVERY 4 HOURS AS NEEDED FOR WHEEZING OR SHORTNESS OF BREATH. 11/01/21   Verlee Monte, MD  ALPRAZolam Prudy Feeler) 0.25 MG tablet TAKE 1 TABLET BY MOUTH TWICE A DAY AS NEEDED 10/04/22   Corwin Levins, MD  amLODipine (NORVASC) 10 MG tablet Take 1 tablet (10 mg total) by mouth daily. 11/14/22   Corwin Levins, MD  aspirin EC 81 MG tablet Take 81 mg by mouth daily. Swallow whole.    [provider]  cloNIDine (CATAPRES) 0.1 MG tablet TAKE 1 TABLET BY MOUTH TWICE A DAY 11/23/22   Corwin Levins, MD  ferrous sulfate 325 (65 FE) MG EC tablet Take 325 mg by mouth daily with breakfast.    [provider]  Fexofenadine HCl (MUCINEX ALLERGY PO) Take  by mouth as needed.    [provider]  fluocinolone (SYNALAR) 0.01 % external solution APPLY TOPICALLY 2 TIMES DAILY AS NEEDED. 07/03/22   Verlee Monte, MD  Fluticasone-Umeclidin-Vilant (TRELEGY ELLIPTA) 200-62.5-25 MCG/ACT AEPB Inhale 1 puff into the lungs daily. 03/08/22   Chilton Greathouse, MD  Fluticasone-Umeclidin-Vilant (TRELEGY ELLIPTA) 200-62.5-25 MCG/ACT AEPB Inhale 1 tablet into the lungs daily. 01/03/23   Chilton Greathouse, MD  furosemide (LASIX) 20 MG tablet TAKE 1 TABLET BY MOUTH IN THE  MORNING 09/26/22   Corwin Levins, MD  gabapentin (NEURONTIN) 300 MG capsule TAKE 2 CAPSULE BY MOUTH 3 TIMES  DAILY 09/18/22   Corwin Levins, MD  hydrALAZINE (APRESOLINE) 100 MG tablet Take 1 tablet (100 mg total) by mouth 3 (three) times daily.  12/25/22   Corwin Levins, MD  iron polysaccharides (NU-IRON) 150 MG capsule Take 1 capsule (150 mg total) by mouth daily. 11/10/22   Corwin Levins, MD  losartan (COZAAR) 100 MG tablet Take 1 tablet (100 mg total) by mouth daily. 11/14/22   Corwin Levins, MD  lovastatin (MEVACOR) 40 MG tablet TAKE 1 TABLET BY MOUTH AT  BEDTIME 06/08/22   Corwin Levins, MD  meclizine (ANTIVERT) 12.5 MG tablet TAKE 1 TABLET BY MOUTH 3 TIMES A DAY AS NEEDED FOR DIZZINESS 06/22/21   Corwin Levins, MD  nystatin (MYCOSTATIN) 100000 UNIT/ML suspension Swish and spit 5mL two times daily after using Symbicort. 11/09/22   Corwin Levins, MD  pantoprazole (PROTONIX) 40 MG tablet TAKE 1 TABLET BY MOUTH DAILY 06/26/22   Corwin Levins, MD  predniSONE (DELTASONE) 20 MG tablet Take 2 tablets (40 mg total) by mouth daily with breakfast. 07/01/21   Myrlene Broker, MD  solifenacin (VESICARE) 10 MG tablet Take 1 tablet (10 mg total) by mouth daily. 11/23/22   Corwin Levins, MD  Spacer/Aero-Holding [facility] Use as directed with inhaler. 09/13/21   Verlee Monte, MD  sucralfate (CARAFATE) 1 g tablet Take 1 tablet (1 g total) by mouth 4 (four) times daily -  with meals and at bedtime. 12/01/20   Corwin Levins, MD  tiZANidine (ZANAFLEX) 4 MG tablet TAKE 1 TABLET BY MOUTH EVERY 6 HOURS AS NEEDED FOR MUSCLE SPASMS. 11/23/22   Corwin Levins, MD  traMADol (ULTRAM) 50 MG tablet Take 1 tablet (50 mg total) by mouth every 6 (six) hours as needed. for pain 12/25/22   Corwin Levins, MD  traZODone (DESYREL) 50 MG tablet TAKE 1/2 TO 1 TABLET BY MOUTH AT BEDTIME AS NEEDED FOR SLEEP 11/23/22   Corwin Levins, MD  TRELEGY ELLIPTA 200-62.5-25 MCG/ACT AEPB INHALE 1 PUFF BY MOUTH EVERY DAY 11/03/22   Mannam, Colbert Coyer, MD  triamcinolone cream (KENALOG) 0.1 % APPLY TO AFFECTED AREA TWICE A DAY 07/10/22   Corwin Levins, MD    Physical Exam: Vitals:   01/14/23 2335 01/15/23 0045 01/15/23 0119 01/15/23 0400  BP:  (!) 185/145 (!) 164/49 (!) 171/60  Pulse:  98 90 95  Resp:  16 18 18   Temp:    98.3 F (36.8 C)  TempSrc:    Oral  SpO2:  94% 94% 95%  Weight: 41.7 kg     Height: 5\' 4"  (1.626 m)       Physical Exam Vitals reviewed.  Constitutional:      General: She is not in acute distress. HENT:     Head: Normocephalic and atraumatic.  Eyes:     Extraocular Movements: Extraocular movements intact.  Cardiovascular:     Rate and Rhythm: Normal rate and regular rhythm.     Pulses: Normal pulses.  Pulmonary:     Effort: Pulmonary effort is normal. No respiratory distress.     Breath sounds: No stridor. Wheezing present. No rhonchi or rales.     Comments: Diminished breath sounds with mild end expiratory wheezing Abdominal:     General: Bowel sounds are normal. There is no distension.     Palpations: Abdomen is soft.     Tenderness: There is no abdominal tenderness. There is no guarding.  Musculoskeletal:     Cervical back: Normal range of motion.     Comments: Mild pitting edema at the level of ankles bilaterally  Skin:  General: Skin is warm and dry.  Neurological:     General: No focal deficit present.     Mental Status: She is alert and oriented to person, place, and time.     Labs on Admission:  I have personally reviewed following labs and imaging studies  CBC: Recent Labs  Lab 01/14/23 2342  WBC 8.9  HGB 9.3*  HCT 31.0*  MCV 85.6  PLT 346   Basic Metabolic Panel: Recent Labs  Lab 01/14/23 2342  NA 136  K 3.4*  CL 98  CO2 32  GLUCOSE 127*  BUN 14  CREATININE 0.99  CALCIUM 10.2   GFR: Estimated Creatinine Clearance: 28.3 mL/min (by C-G formula based on SCr of 0.99 mg/dL). Liver Function Tests: Recent Labs  Lab 01/14/23 2342  AST 15  ALT 13  ALKPHOS 66  BILITOT 0.5  PROT 8.0  ALBUMIN 3.7   No results for input(s): "LIPASE", "AMYLASE" in the last 168 hours. No results for input(s): "AMMONIA" in the last 168 hours. Coagulation Profile: No results for input(s): "INR", "PROTIME" in the last 168 hours. Cardiac Enzymes: No results for input(s): "CKTOTAL", "CKMB", "CKMBINDEX", "TROPONINI" in the last 168 hours. BNP (last 3 results) No results for input(s): "PROBNP" in the last 8760 hours. HbA1C: No results for input(s): "HGBA1C" in the last 72 hours. CBG: No results for input(s): "GLUCAP" in the last 168 hours. Lipid Profile: No results for input(s): "CHOL", "HDL", "LDLCALC", "TRIG", "CHOLHDL", "LDLDIRECT" in the last 72 hours. Thyroid Function Tests: No results for input(s): "TSH", "T4TOTAL", "FREET4", "T3FREE", "THYROIDAB" in the last 72 hours. Anemia Panel: No results for input(s): "VITAMINB12", "FOLATE", "FERRITIN", "TIBC", "IRON", "RETICCTPCT" in the last 72 hours. Urine analysis:    Component Value Date/Time   COLORURINE YELLOW 11/09/2022 1447   APPEARANCEUR CLEAR 11/09/2022 1447   LABSPEC <=1.005 (A) 11/09/2022 1447   PHURINE 7.0 11/09/2022 1447   GLUCOSEU NEGATIVE 11/09/2022 1447   HGBUR NEGATIVE 11/09/2022 1447   BILIRUBINUR NEGATIVE 11/09/2022 1447   KETONESUR NEGATIVE 11/09/2022 1447   PROTEINUR NEGATIVE 08/25/2017 1518   UROBILINOGEN 0.2 11/09/2022 1447   NITRITE NEGATIVE 11/09/2022 1447   LEUKOCYTESUR NEGATIVE 11/09/2022 1447     Radiological Exams on Admission: CT Angio Chest PE W/Cm &/Or Wo Cm  Result Date: 01/15/2023 CLINICAL DATA:  Shortness of breath progressively worsening. PE suspected. EXAM: CT ANGIOGRAPHY CHEST WITH CONTRAST TECHNIQUE: Multidetector CT imaging of the chest was performed using the standard protocol during bolus administration of intravenous contrast. Multiplanar CT image reconstructions and MIPs were obtained to evaluate the vascular anatomy. RADIATION DOSE REDUCTION: This exam was performed according to the departmental dose-optimization program which includes automated exposure control, adjustment of the mA and/or kV according to patient size and/or use of iterative reconstruction technique. CONTRAST:  75mL OMNIPAQUE IOHEXOL 350 MG/ML SOLN COMPARISON:  Chest radiograph 01/15/2023 and CTA chest 10/17/2008 FINDINGS: Cardiovascular: Negative for acute pulmonary embolism. No pericardial effusion. Coronary artery and aortic atherosclerotic calcification. No aortic aneurysm or dissection. Mediastinum/Nodes: Bowing of the trachea compatible with aspiration. Unremarkable esophagus. Enlarged mediastinal and hilar lymph nodes. For example a subcarinal node measuring 1.5 cm on series 4/image 59 and a AP window node measuring 1.6 cm on 4/48. Lungs/Pleura: Emphysema. Bibasilar atelectasis/scarring with associated bronchiolectasis. 1.4 cm subpleural nodule along the posteromedial right lower lobe may be due to rounded atelectasis however mass is not excluded (series 12/image 72). Upper Abdomen: No acute abnormality. Musculoskeletal: No acute fracture. Right subscapularis intramuscular lipoma. Review of the MIP images confirms the  above findings. IMPRESSION: 1. Negative for acute pulmonary embolism. 2. 1.4 cm subpleural nodule along the posteromedial right lower lobe may be due to rounded atelectasis however mass is not excluded. Consider one of the following in 3 months for both low-risk and high-risk individuals: (a)  repeat chest CT, (b) follow-up PET-CT, or (c) tissue sampling. This recommendation follows the consensus statement: Guidelines for Management of Incidental Pulmonary Nodules Detected on CT Images: From the Fleischner Society 2017; Radiology 2017; 284:228-243. 3. Enlarged mediastinal and hilar lymph nodes. Continued attention on follow-up. 4. Aortic Atherosclerosis (ICD10-I70.0) and Emphysema (ICD10-J43.9). Electronically Signed   By: Minerva Fester M.D.   On: 01/15/2023 02:00   DG Chest Port 1 View  Result Date: 01/15/2023 CLINICAL DATA:  Shortness of breath EXAM: PORTABLE CHEST 1 VIEW COMPARISON:  06/12/2021 FINDINGS: Stable cardiomegaly. Aortic atherosclerotic calcification. Tortuous aorta. Atelectasis or infiltrates in the lower lungs. Trace bilateral pleural effusions or pleural thickening. No pneumothorax. IMPRESSION: Atelectasis or infiltrates in the lower lungs. Electronically Signed   By: Minerva Fester M.D.   On: 01/15/2023 00:27    EKG: Independently reviewed.  Sinus rhythm, no acute ischemic changes.  Assessment and Plan  Acute COPD exacerbation Acute on chronic hypoxemic respiratory failure Dyspnea improved after she received nebulizer treatments and IV Solu-Medrol.  She is stable on 4 L [place] at rest but sats dropped to the 80s with ambulation in the ED.  No fever or leukocytosis.  BNP normal.  EKG without acute ischemic changes and troponin negative.  Patient is not having any chest pain.  CTA chest negative for PE or pneumonia.  SARS-CoV-2 PCR negative.  She has diminished breath sounds on exam with mild wheezing.  Continue treatment with Solu-Medrol 40 mg every 12 hours, DuoNeb every 8 hours, albuterol neb every 4 hours as needed, antitussive as needed.  Continue supplemental oxygen.  Incidental pulmonary nodule CTA chest showing a 1.4 cm subpleural nodule along the posteromedial right lower lobe which radiologist thinks may be due to rounded atelectasis, however, mass not excluded.   Recommending follow-up in 3 months with repeat CT chest, PET/CT, or tissue sampling.  Patient is high risk given history of cigarette smoking in the past.  Hypertension Systolic currently in the 160s.  Pharmacy med rec pending.  IV labetalol PRN.   Normocytic anemia Hemoglobin 9.3, baseline 10-11.  No signs or symptoms of bleeding.  Continue to monitor labs.  Mild hypokalemia Monitor potassium and magnesium levels, continue to replace as needed.  Chronic HFpEF: No signs of volume overload. Pulmonary hypertension Anxiety Chronic pain syndrome GERD/hiatal hernia Hyperlipidemia History of stroke/TIA Pharmacy med rec pending.  DVT prophylaxis: Lovenox Code Status: Full Code (discussed with the patient) Level of care: Telemetry bed Admission status: It is my clinical opinion that referral for OBSERVATION is reasonable and necessary in this patient based on the above information provided. The aforementioned taken together are felt to place the patient at high risk for further clinical deterioration. However, it is anticipated that the patient may be medically stable for discharge from the hospital within 24 to 48 hours.  John Giovanni MD [facility]  If 7PM-7AM, please contact night-coverage [email or web address]  01/15/2023, 5:59 AM

## 2023-01-15 NOTE — ED Notes (Addendum)
ED TO INPATIENT HANDOFF REPORT  ED Nurse Name and Phone #: Christon [phone]  S Name/Age/Gender Wendy Torres 82 y.o. female Room/Bed: WA25/WA25  Code Status   Code Status: Prior  Home/SNF/Other Home Patient oriented to: self, place, time, and situation Is this baseline? Yes   Triage Complete: Triage complete  Chief Complaint COPD with acute exacerbation (HCC) [J44.1]  Triage Note Pt BIBA after having respiratory distress. O2 91% on 4L [place] (Chronic/Baseline) when EMS arrived. Pt states that her SOB has been going on since 0800 this am and just progressively got worse so daughter called EMS.. Pt received 10mg  Albuterol, 1mg  Atrovent, and 125mg  of Solu-medrol with medics.   Allergies Allergies  Allergen Reactions   Chlorthalidone     Level of Care/Admitting Diagnosis ED Disposition     ED Disposition  Admit   Condition  --   Comment  Hospital Area: [facility] [[id number]]  Level of Care: Telemetry [5]  Admit to tele based on following criteria: Complex arrhythmia (Bradycardia/Tachycardia)  May place patient in observation at [facility] or [facility] if equivalent level of care is available:: Yes  Covid Evaluation: Asymptomatic - no recent exposure (last 10 days) testing not required  Diagnosis: COPD with acute exacerbation [facility]) [[id number]]  Admitting Physician: John Giovanni [[id number]]  Attending Physician: John Giovanni [[id number]]          B Medical/Surgery History Past Medical History:  Diagnosis Date   ABNORMAL CHEST XRAY 11/05/2008   ACUTE URIS OF UNSPECIFIED SITE 10/22/2007   ALLERGIC RHINITIS 12/09/2006   ANEMIA 01/14/2009   Anxiety    Arthritis    ASTHMA 12/09/2006   Asthma    Cataract    bilateral -removed   CHF (congestive heart failure) (HCC)    Chronic pain syndrome 02/16/2009   COPD 12/09/2006   DEGENERATIVE JOINT DISEASE 12/09/2006   DIZZINESS 03/30/2010   DYSPNEA 01/27/2010   with heavy exertion   FREQUENCY,  URINARY 03/30/2010   GERD 12/09/2006   Glossitis 01/14/2009   HELICOBACTER PYLORI GASTRITIS, HX OF 12/09/2006   HIATAL HERNIA 03/01/2010   History of hiatal hernia    HYPERLIPIDEMIA 04/18/2007   HYPERSOMNIA 08/31/2008   HYPERTENSION 12/09/2006   INSOMNIA-SLEEP DISORDER-UNSPEC 11/05/2008   LOW BACK PAIN 12/09/2006   Morbid obesity (HCC) 12/09/2006   Nonspecific (abnormal) findings on radiological and other examination of body structure 11/05/2008   OSTEOPOROSIS 12/09/2006   OVERACTIVE BLADDER 04/14/2008   PALPITATIONS, RECURRENT 01/27/2010   Stroke (HCC)    pt. stated light one years ago   SYNCOPE 11/05/2008   TRANSIENT ISCHEMIC ATTACK, HX OF 12/09/2006   Past Surgical History:  Procedure Laterality Date   COLONOSCOPY     LUMBAR DISC SURGERY     TONSILLECTOMY     as a child   TOTAL HIP ARTHROPLASTY Right 11/06/2012   Procedure: RIGHT TOTAL HIP ARTHROPLASTY WITH ACETABULAR AUTOGRAFT;  Surgeon: Loanne Drilling, MD;  Location: [facility];  Service: Orthopedics;  Laterality: Right;   TOTAL HIP ARTHROPLASTY Left 02/18/2014   Procedure: LEFT TOTAL HIP ARTHROPLASTY;  Surgeon: Loanne Drilling, MD;  Location: [facility];  Service: Orthopedics;  Laterality: Left;   TUBAL LIGATION       A IV Location/Drains/Wounds Patient Lines/Drains/Airways Status     Active Line/Drains/Airways     Name Placement date Placement time Site Days   Peripheral IV 01/14/23 20 G Anterior;Right Wrist 01/14/23  2321  Wrist  1  Intake/Output Last 24 hours No intake or output data in the 24 hours ending 01/15/23 6387  Labs/Imaging Results for orders placed or performed during the hospital encounter of 01/14/23 (from the past 48 hour(s))  CBC     Status: Abnormal   Collection Time: 01/14/23 11:42 PM  Result Value Ref Range   WBC 8.9 4.0 - 10.5 K/uL   RBC 3.62 (L) 3.87 - 5.11 MIL/uL   Hemoglobin 9.3 (L) 12.0 - 15.0 g/dL   HCT 56.4 (L) 33.2 - 95.1 %   MCV 85.6 80.0 - 100.0 fL   MCH 25.7 (L) 26.0  - 34.0 pg   MCHC 30.0 30.0 - 36.0 g/dL   RDW 88.4 16.6 - 06.3 %   Platelets 346 150 - 400 K/uL   nRBC 0.0 0.0 - 0.2 %    Comment: Performed at [facility], [address], [place], [place] [address]  Comprehensive metabolic panel     Status: Abnormal   Collection Time: 01/14/23 11:42 PM  Result Value Ref Range   Sodium 136 135 - 145 mmol/L   Potassium 3.4 (L) 3.5 - 5.1 mmol/L   Chloride 98 98 - 111 mmol/L   CO2 32 22 - 32 mmol/L   Glucose, Bld 127 (H) 70 - 99 mg/dL    Comment: Glucose reference range applies only to samples taken after fasting for at least 8 hours.   BUN 14 8 - 23 mg/dL   Creatinine, Ser 0.93 0.44 - 1.00 mg/dL   Calcium 23.5 8.9 - 57.3 mg/dL   Total Protein 8.0 6.5 - 8.1 g/dL   Albumin 3.7 3.5 - 5.0 g/dL   AST 15 15 - 41 U/L   ALT 13 0 - 44 U/L   Alkaline Phosphatase 66 38 - 126 U/L   Total Bilirubin 0.5 0.3 - 1.2 mg/dL   GFR, Estimated 57 (L) >60 mL/min    Comment: (NOTE) Calculated using the CKD-EPI Creatinine Equation (2021)    Anion gap 6 5 - 15    Comment: Performed at [facility], [address] [place], [place] [address]  Brain natriuretic peptide     Status: None   Collection Time: 01/14/23 11:42 PM  Result Value Ref Range   B Natriuretic Peptide 40.9 0.0 - 100.0 pg/mL    Comment: Performed at [facility], [address], [place], [place] [address]  Troponin I (High Sensitivity)     Status: None   Collection Time: 01/14/23 11:42 PM  Result Value Ref Range   Troponin I (High Sensitivity) 4 <18 ng/L    Comment: (NOTE) Elevated high sensitivity troponin I (hsTnI) values and significant  changes across serial measurements may suggest ACS but many other  chronic and acute conditions are known to elevate hsTnI results.  Refer to the "Links" section for chest pain algorithms and additional  guidance. Performed at [facility], [address] [place], [place] [address]    SARS Coronavirus 2 by RT PCR (hospital order, performed in [facility] hospital lab) *cepheid single result test* Anterior Nasal Swab     Status: None   Collection Time: 01/15/23 12:15 AM   Specimen: Anterior Nasal Swab  Result Value Ref Range   SARS Coronavirus 2 by RT PCR NEGATIVE NEGATIVE    Comment: (NOTE) SARS-CoV-2 target nucleic acids are NOT DETECTED.  The SARS-CoV-2 RNA is generally detectable in upper and lower respiratory specimens during the acute phase of infection. The lowest concentration of  SARS-CoV-2 viral copies this assay can detect is 250 copies / mL. A negative result does not preclude SARS-CoV-2 infection and should not be used as the sole basis for treatment or other patient management decisions.  A negative result may occur with improper specimen collection / handling, submission of specimen other than nasopharyngeal swab, presence of viral mutation(s) within the areas targeted by this assay, and inadequate number of viral copies (<250 copies / mL). A negative result must be combined with clinical observations, patient history, and epidemiological information.  Fact Sheet for Patients:   RoadLapTop.co.za  Fact Sheet for Healthcare Providers: http://kim-miller.com/  This test is not yet approved or  cleared by the [place] FDA and has been authorized for detection and/or diagnosis of SARS-CoV-2 by FDA under an Emergency Use Authorization (EUA).  This EUA will remain in effect (meaning this test can be used) for the duration of the COVID-19 declaration under Section 564(b)(1) of the Act, 21 U.S.C. section 360bbb-3(b)(1), unless the authorization is terminated or revoked sooner.  Performed at [facility], [address], [place], [place] [address]    CT Angio Chest PE W/Cm &/Or Wo Cm  Result Date: 01/15/2023 CLINICAL DATA:  Shortness of breath progressively worsening. PE suspected.  EXAM: CT ANGIOGRAPHY CHEST WITH CONTRAST TECHNIQUE: Multidetector CT imaging of the chest was performed using the standard protocol during bolus administration of intravenous contrast. Multiplanar CT image reconstructions and MIPs were obtained to evaluate the vascular anatomy. RADIATION DOSE REDUCTION: This exam was performed according to the departmental dose-optimization program which includes automated exposure control, adjustment of the mA and/or kV according to patient size and/or use of iterative reconstruction technique. CONTRAST:  75mL OMNIPAQUE IOHEXOL 350 MG/ML SOLN COMPARISON:  Chest radiograph 01/15/2023 and CTA chest 10/17/2008 FINDINGS: Cardiovascular: Negative for acute pulmonary embolism. No pericardial effusion. Coronary artery and aortic atherosclerotic calcification. No aortic aneurysm or dissection. Mediastinum/Nodes: Bowing of the trachea compatible with aspiration. Unremarkable esophagus. Enlarged mediastinal and hilar lymph nodes. For example a subcarinal node measuring 1.5 cm on series 4/image 59 and a AP window node measuring 1.6 cm on 4/48. Lungs/Pleura: Emphysema. Bibasilar atelectasis/scarring with associated bronchiolectasis. 1.4 cm subpleural nodule along the posteromedial right lower lobe may be due to rounded atelectasis however mass is not excluded (series 12/image 72). Upper Abdomen: No acute abnormality. Musculoskeletal: No acute fracture. Right subscapularis intramuscular lipoma. Review of the MIP images confirms the above findings. IMPRESSION: 1. Negative for acute pulmonary embolism. 2. 1.4 cm subpleural nodule along the posteromedial right lower lobe may be due to rounded atelectasis however mass is not excluded. Consider one of the following in 3 months for both low-risk and high-risk individuals: (a) repeat chest CT, (b) follow-up PET-CT, or (c) tissue sampling. This recommendation follows the consensus statement: Guidelines for Management of Incidental Pulmonary Nodules  Detected on CT Images: From the Fleischner Society 2017; Radiology 2017; 284:228-243. 3. Enlarged mediastinal and hilar lymph nodes. Continued attention on follow-up. 4. Aortic Atherosclerosis (ICD10-I70.0) and Emphysema (ICD10-J43.9). Electronically Signed   By: Minerva Fester M.D.   On: 01/15/2023 02:00   DG Chest Port 1 View  Result Date: 01/15/2023 CLINICAL DATA:  Shortness of breath EXAM: PORTABLE CHEST 1 VIEW COMPARISON:  06/12/2021 FINDINGS: Stable cardiomegaly. Aortic atherosclerotic calcification. Tortuous aorta. Atelectasis or infiltrates in the lower lungs. Trace bilateral pleural effusions or pleural thickening. No pneumothorax. IMPRESSION: Atelectasis or infiltrates in the lower lungs. Electronically Signed   By: Minerva Fester M.D.   On: 01/15/2023 00:27  Pending Labs Unresulted Labs (From admission, onward)    None       Vitals/Pain Today's Vitals   01/15/23 0015 01/15/23 0045 01/15/23 0119 01/15/23 0400  BP:  (!) 185/145 (!) 164/49 (!) 171/60  Pulse:  98 90 95  Resp:  16 18 18   Temp:    98.3 F (36.8 C)  TempSrc:    Oral  SpO2:  94% 94% 95%  Weight:      Height:      PainSc: 0-No pain  0-No pain 0-No pain    Isolation Precautions No active isolations  Medications Medications  albuterol (PROVENTIL) (2.5 MG/3ML) 0.083% nebulizer solution 5 mg (5 mg Nebulization Given 01/14/23 2326)  ipratropium (ATROVENT) nebulizer solution 0.5 mg (0.5 mg Nebulization Given 01/14/23 2326)  iohexol (OMNIPAQUE) 350 MG/ML injection 75 mL (75 mLs Intravenous Contrast Given 01/15/23 0126)  ipratropium-albuterol (DUONEB) 0.5-2.5 (3) MG/3ML nebulizer solution 3 mL (3 mLs Nebulization Given 01/15/23 0402)    Mobility walks with walker      R Report given to: Kiristin Ryan,RN

## 2023-01-15 NOTE — Addendum Note (Signed)
Addended by: Aundra Millet on: 01/15/2023 03:22 PM   Modules accepted: Orders

## 2023-01-16 DIAGNOSIS — J441 Chronic obstructive pulmonary disease with (acute) exacerbation: Secondary | ICD-10-CM | POA: Diagnosis not present

## 2023-01-16 LAB — CBC
HCT: 30.9 % — ABNORMAL LOW (ref 36.0–46.0)
Hemoglobin: 9.4 g/dL — ABNORMAL LOW (ref 12.0–15.0)
MCH: 25.1 pg — ABNORMAL LOW (ref 26.0–34.0)
MCHC: 30.4 g/dL (ref 30.0–36.0)
MCV: 82.4 fL (ref 80.0–100.0)
Platelets: 365 10*3/uL (ref 150–400)
RBC: 3.75 MIL/uL — ABNORMAL LOW (ref 3.87–5.11)
RDW: 14.8 % (ref 11.5–15.5)
WBC: 12.7 10*3/uL — ABNORMAL HIGH (ref 4.0–10.5)
nRBC: 0 % (ref 0.0–0.2)

## 2023-01-16 MED ORDER — PANTOPRAZOLE SODIUM 40 MG PO TBEC
40.0000 mg | DELAYED_RELEASE_TABLET | Freq: Two times a day (BID) | ORAL | Status: DC
  Administered 2023-01-16 – 2023-01-19 (×6): 40 mg via ORAL
  Filled 2023-01-16 (×6): qty 1

## 2023-01-16 MED ORDER — PREDNISONE 50 MG PO TABS
50.0000 mg | ORAL_TABLET | Freq: Every day | ORAL | Status: DC
  Administered 2023-01-17 – 2023-01-18 (×2): 50 mg via ORAL
  Filled 2023-01-16 (×2): qty 1

## 2023-01-16 NOTE — Progress Notes (Signed)
PROGRESS NOTE    Wendy Torres  NWG:[id number] DOB: [birth year] DOA: 01/14/2023 PCP: Corwin Levins, MD   Brief Narrative: Wendy Torres is a 82 y.o. female with a history of hypertension, COPD, chronic respiratory failure on 4 L/min of oxygen, pulmonary hypertension, chronic diastolic heart failure, anxiety, chronic pain syndrome, GERD, hyperlipidemia, stroke/TIA.  Patient presented secondary to shortness of breath and found to have evidence of a COPD exacerbation. This was treated with breathing treatments and steroids with quick improvement. During hospitalization, patient was noted to have black stool concerning for melena. FOBT ordered and GI consulted.   Assessment and Plan:  COPD exacerbation Patient with dyspnea on exertion with productive cough. Patient started on steroids and breathing treatments. Wheezing improved. Patient with some improvement in functional capability today. -Continue Solu-medrol -Continue albuterol PRN -Incentive spirometer and flutter valve -PT/OT evaluations   Chronic respiratory failure with hypoxia Patient is currently at baseline oxygen. -Continue oxygen supplementation at 4 L/min   Pulmonary nodule Incidental measuring 1.4 cm located in the posteromedial right lower lobe seen on CT imaging. Recommendation for repeat imaging in three months. -Per radiology: Repeat chest CT or Follow-up PET-CT or Tissue sampling -Will recommend pulmonology follow-up as an outpatient  Black stools Possible melena. Normal BUN from metabolic panel on 9/16. -Discontinue aspirin and Lovenox -FOBT -Continue Protonix but increase to BID dosing -Per chart review, it appears patient was referred to [facility] for iron deficiency anemia -[facility] consulted   Hypokalemia Mild. Resolved with repletion.   Primary hypertension -Continue amlodipine and losartan   Chronic diastolic heart failure Stable. Euvolemic.   Pulmonary  hypertension Noted.  Leukocytosis Likely secondary to steroids.   Anxiety -Continue Xanax PRN   Chronic pain syndrome -Continue Tramadol   GERD -Protonix   Hyperlipidemia -Pravastatin while inpatient   History of stroke/TIA Noted. -Hold aspirin secondary to possible GI bleed   DVT prophylaxis: SCDs Code Status:   Code Status: Full Code Family Communication: Daughter on telephone Disposition Plan: Discharge pending possible GI workup/management and PT/OT recommendations   Consultants:  [facility]  Procedures:  None  Antimicrobials: None    Subjective: Patient reports improved tolerance with mobility today. Dyspnea is improved. Physical therapy noted black stools this morning when patient had a bowel movement. Patient reports having black stools for the past month, at least.  Objective: BP (!) 149/59 (BP Location: Left Leg)   Pulse 73   Temp 98.6 F (37 C) (Oral)   Resp 19   Ht 5\' 4"  (1.626 m)   Wt 41.7 kg   SpO2 94%   BMI 15.78 kg/m   Examination:  General exam: Appears calm and comfortable Respiratory system: Clear to auscultation. Respiratory effort normal. Cardiovascular system: S1 & S2 heard, RRR. No murmurs, rubs, gallops or clicks. Gastrointestinal system: Abdomen is nondistended, soft and nontender. No organomegaly or masses felt. Normal bowel sounds heard. Central nervous system: Alert and oriented. No focal neurological deficits. Musculoskeletal: No edema. No calf tenderness Skin: No cyanosis. No rashes Psychiatry: Judgement and insight appear normal. Mood & affect appropriate.    Data Reviewed: I have personally reviewed following labs and imaging studies  CBC Lab Results  Component Value Date   WBC 12.7 (H) 01/16/2023   RBC 3.75 (L) 01/16/2023   HGB 9.4 (L) 01/16/2023   HCT 30.9 (L) 01/16/2023   MCV 82.4 01/16/2023   MCH 25.1 (L) 01/16/2023   PLT 365 01/16/2023   MCHC 30.4 01/16/2023   RDW 14.8  01/16/2023   LYMPHSABS 1.9  12/19/2022   MONOABS 1.0 12/19/2022   EOSABS 0.2 12/19/2022   BASOSABS 0.1 12/19/2022     Last metabolic panel Lab Results  Component Value Date   NA 135 01/15/2023   K 4.1 01/15/2023   CL 100 01/15/2023   CO2 26 01/15/2023   BUN 14 01/15/2023   CREATININE 1.02 (H) 01/15/2023   GLUCOSE 145 (H) 01/15/2023   GFRNONAA 55 (L) 01/15/2023   GFRAA >60 08/26/2017   CALCIUM 10.6 (H) 01/15/2023   PROT 8.0 01/14/2023   ALBUMIN 3.7 01/14/2023   LABGLOB 4.0 (H) 03/16/2020   AGRATIO 0.9 03/16/2020   BILITOT 0.5 01/14/2023   ALKPHOS 66 01/14/2023   AST 15 01/14/2023   ALT 13 01/14/2023   ANIONGAP 9 01/15/2023    GFR: Estimated Creatinine Clearance: 27.5 mL/min (A) (by C-G formula based on SCr of 1.02 mg/dL (H)).  Recent Results (from the past 240 hour(s))  SARS Coronavirus 2 by RT PCR (hospital order, performed in [facility] hospital lab) *cepheid single result test* Anterior Nasal Swab     Status: None   Collection Time: 01/15/23 12:15 AM   Specimen: Anterior Nasal Swab  Result Value Ref Range Status   SARS Coronavirus 2 by RT PCR NEGATIVE NEGATIVE Final    Comment: (NOTE) SARS-CoV-2 target nucleic acids are NOT DETECTED.  The SARS-CoV-2 RNA is generally detectable in upper and lower respiratory specimens during the acute phase of infection. The lowest concentration of SARS-CoV-2 viral copies this assay can detect is 250 copies / mL. A negative result does not preclude SARS-CoV-2 infection and should not be used as the sole basis for treatment or other patient management decisions.  A negative result may occur with improper specimen collection / handling, submission of specimen other than nasopharyngeal swab, presence of viral mutation(s) within the areas targeted by this assay, and inadequate number of viral copies (<250 copies / mL). A negative result must be combined with clinical observations, patient history, and epidemiological information.  Fact Sheet for Patients:    RoadLapTop.co.za  Fact Sheet for Healthcare Providers: http://kim-miller.com/  This test is not yet approved or  cleared by the [place] FDA and has been authorized for detection and/or diagnosis of SARS-CoV-2 by FDA under an Emergency Use Authorization (EUA).  This EUA will remain in effect (meaning this test can be used) for the duration of the COVID-19 declaration under Section 564(b)(1) of the Act, 21 U.S.C. section 360bbb-3(b)(1), unless the authorization is terminated or revoked sooner.  Performed at [facility], [address], [place], [place] [address]       Radiology Studies: CT Angio Chest PE W/Cm &/Or Wo Cm  Result Date: 01/15/2023 CLINICAL DATA:  Shortness of breath progressively worsening. PE suspected. EXAM: CT ANGIOGRAPHY CHEST WITH CONTRAST TECHNIQUE: Multidetector CT imaging of the chest was performed using the standard protocol during bolus administration of intravenous contrast. Multiplanar CT image reconstructions and MIPs were obtained to evaluate the vascular anatomy. RADIATION DOSE REDUCTION: This exam was performed according to the departmental dose-optimization program which includes automated exposure control, adjustment of the mA and/or kV according to patient size and/or use of iterative reconstruction technique. CONTRAST:  75mL OMNIPAQUE IOHEXOL 350 MG/ML SOLN COMPARISON:  Chest radiograph 01/15/2023 and CTA chest 10/17/2008 FINDINGS: Cardiovascular: Negative for acute pulmonary embolism. No pericardial effusion. Coronary artery and aortic atherosclerotic calcification. No aortic aneurysm or dissection. Mediastinum/Nodes: Bowing of the trachea compatible with aspiration. Unremarkable esophagus. Enlarged mediastinal and  hilar lymph nodes. For example a subcarinal node measuring 1.5 cm on series 4/image 59 and a AP window node measuring 1.6 cm on 4/48. Lungs/Pleura: Emphysema. Bibasilar  atelectasis/scarring with associated bronchiolectasis. 1.4 cm subpleural nodule along the posteromedial right lower lobe may be due to rounded atelectasis however mass is not excluded (series 12/image 72). Upper Abdomen: No acute abnormality. Musculoskeletal: No acute fracture. Right subscapularis intramuscular lipoma. Review of the MIP images confirms the above findings. IMPRESSION: 1. Negative for acute pulmonary embolism. 2. 1.4 cm subpleural nodule along the posteromedial right lower lobe may be due to rounded atelectasis however mass is not excluded. Consider one of the following in 3 months for both low-risk and high-risk individuals: (a) repeat chest CT, (b) follow-up PET-CT, or (c) tissue sampling. This recommendation follows the consensus statement: Guidelines for Management of Incidental Pulmonary Nodules Detected on CT Images: From the Fleischner Society 2017; Radiology 2017; 284:228-243. 3. Enlarged mediastinal and hilar lymph nodes. Continued attention on follow-up. 4. Aortic Atherosclerosis (ICD10-I70.0) and Emphysema (ICD10-J43.9). Electronically Signed   By: Minerva Fester M.D.   On: 01/15/2023 02:00   DG Chest Port 1 View  Result Date: 01/15/2023 CLINICAL DATA:  Shortness of breath EXAM: PORTABLE CHEST 1 VIEW COMPARISON:  06/12/2021 FINDINGS: Stable cardiomegaly. Aortic atherosclerotic calcification. Tortuous aorta. Atelectasis or infiltrates in the lower lungs. Trace bilateral pleural effusions or pleural thickening. No pneumothorax. IMPRESSION: Atelectasis or infiltrates in the lower lungs. Electronically Signed   By: Minerva Fester M.D.   On: 01/15/2023 00:27      LOS: 1 day    Jacquelin Hawking, MD [facility] 01/16/2023, 11:54 AM   If 7PM-7AM, please contact night-coverage [email or web address]

## 2023-01-16 NOTE — Evaluation (Signed)
Physical Therapy Evaluation Patient Details Name: Wendy Torres MRN: [id number] DOB: 24-Oct-1940 Today's Date: 01/16/2023  History of Present Illness  82 yo female admitted with COPD exac. Hx of COPD, DOE, CHF, LTHA, chronic anemia, OA  Clinical Impression  On eval, pt was mostly CGA for mobility. She walked ~20 feet with a RW. Pt fatigues fairly easily with activity. Dyspnea 2/4. O2 89% on 4L with ambulation, 98% on 4L at rest. Will plan to follow and progress activity as tolerated.Discussed d/c plan-pt plans to return home. Recommend HHPT f/u.        If plan is discharge home, recommend the following: A little help with bathing/dressing/bathroom;Assistance with cooking/housework;Assist for transportation;Help with stairs or ramp for entrance   Can travel by private vehicle        Equipment Recommendations None recommended by PT  Recommendations for Other Services  OT consult    Functional Status Assessment Patient has had a recent decline in their functional status and demonstrates the ability to make significant improvements in function in a reasonable and predictable amount of time.     Precautions / Restrictions Precautions Precaution Comments: O2 dep @ baseline Restrictions Weight Bearing Restrictions: No      Mobility  Bed Mobility Overal bed mobility: Needs Assistance Bed Mobility: Supine to Sit, Sit to Supine     Supine to sit: Supervision, HOB elevated Sit to supine: Min assist   General bed mobility comments: Small amount of assist for LEs. Increased time and effort for pt.    Transfers Overall transfer level: Needs assistance Equipment used: Rolling walker (2 wheels) Transfers: Sit to/from Stand Sit to Stand: Contact guard assist, From elevated surface           General transfer comment: Cues for safety, hand placement. Increased time.    Ambulation/Gait Ambulation/Gait assistance: Contact guard assist Gait Distance (Feet): 20 Feet Assistive  device: Rolling walker (2 wheels) Gait Pattern/deviations: Step-through pattern, Decreased stride length       General Gait Details: Slow gait speed. Fatigues fairly easily. O2 89% on 4L.  Stairs            Wheelchair Mobility     Tilt Bed    Modified Rankin (Stroke Patients Only)       Balance Overall balance assessment: Needs assistance         Standing balance support: Reliant on assistive device for balance, Bilateral upper extremity supported, During functional activity Standing balance-Leahy Scale: Fair                               Pertinent Vitals/Pain Pain Assessment Pain Assessment: 0-10 Pain Score: 7  Faces Pain Scale: Hurts little more Pain Location: back Pain Descriptors / Indicators: Aching, Discomfort Pain Intervention(s): Patient requesting pain meds-RN notified, Monitored during session, Repositioned, Heat applied    Home Living Family/patient expects to be discharged to:: Private residence Living Arrangements: Alone Available Help at Discharge: Family;Available PRN/intermittently Type of Home: House Home Access: Ramped entrance       Home Layout: One level Home Equipment: Rollator (4 wheels);Wheelchair - manual      Prior Function Prior Level of Function : Needs assist             Mobility Comments: uses rollator ADLs Comments: family assists with IADLs     Extremity/Trunk Assessment   Upper Extremity Assessment Upper Extremity Assessment: Defer to OT evaluation    Lower Extremity Assessment  Lower Extremity Assessment: Generalized weakness    Cervical / Trunk Assessment Cervical / Trunk Assessment: Normal  Communication   Communication Communication: No apparent difficulties  Cognition Arousal: Alert Behavior During Therapy: WFL for tasks assessed/performed Overall Cognitive Status: Within Functional Limits for tasks assessed                                          General  Comments      Exercises     Assessment/Plan    PT Assessment Patient needs continued PT services  PT Problem List Decreased strength;Decreased activity tolerance;Decreased balance;Decreased mobility;Pain       PT Treatment Interventions DME instruction;Gait training;Functional mobility training;Therapeutic activities;Therapeutic exercise;Patient/family education;Balance training    PT Goals (Current goals can be found in the Care Plan section)  Acute Rehab PT Goals Patient Stated Goal: to feel better PT Goal Formulation: With patient Time For Goal Achievement: 01/30/23 Potential to Achieve Goals: Good    Frequency Min 1X/week     Co-evaluation               AM-PAC PT "6 Clicks" Mobility  Outcome Measure Help needed turning from your back to your side while in a flat bed without using bedrails?: A Little Help needed moving from lying on your back to sitting on the side of a flat bed without using bedrails?: A Little Help needed moving to and from a bed to a chair (including a wheelchair)?: A Little Help needed standing up from a chair using your arms (e.g., wheelchair or bedside chair)?: A Little Help needed to walk in hospital room?: A Little Help needed climbing 3-5 steps with a railing? : A Lot 6 Click Score: 17    End of Session Equipment Utilized During Treatment: Gait belt;Oxygen Activity Tolerance: Patient limited by fatigue;Patient tolerated treatment well Patient left: in bed;with call bell/phone within reach;with bed alarm set   PT Visit Diagnosis: Muscle weakness (generalized) (M62.81);Difficulty in walking, not elsewhere classified (R26.2)    Time: 1191-4782 PT Time Calculation (min) (ACUTE ONLY): 18 min   Charges:   PT Evaluation $PT Eval Low Complexity: 1 Low   PT General Charges $$ ACUTE PT VISIT: 1 Visit           Faye Ramsay, PT Acute Rehabilitation  Office: [phone]

## 2023-01-16 NOTE — Evaluation (Signed)
Occupational Therapy Evaluation Patient Details Name: Wendy Torres MRN: [id number] DOB: [birth year] Today's Date: 01/16/2023   History of Present Illness 82 yr old female admitted with COPD exacerbation and acute on chronic hypoxemic respiratory failure. Hx of COPD, pulmonary HTN, heart failure, chronic anemia, OA, chronic pain syndrome, DJD   Clinical Impression   Pt is currently presenting with the below listed deficits, which compromise her ADL performance and overall functional independence (see OT problem list). During the therapy session, she required CGA for sit to stand, for ambulating in her room using a RW, and for performing a toilet transfer. She uses 4L O2 around the clock at her baseline. She was noted to be with shortness of breath with activity and deconditioning. She will benefit from OT services to maximize her independence with self care tasks and to facilitate her safe return home.        If plan is discharge home, recommend the following: Assist for transportation;Assistance with cooking/housework;Help with stairs or ramp for entrance    Functional Status Assessment  Patient has had a recent decline in their functional status and demonstrates the ability to make significant improvements in function in a reasonable and predictable amount of time.  Equipment Recommendations  None recommended by OT    Recommendations for Other Services       Precautions / Restrictions Precautions Precaution Comments: O2 dep @ baseline Restrictions Weight Bearing Restrictions: No Other Position/Activity Restrictions: Uses 4L O2 at all times at her baseline      Mobility Bed Mobility Overal bed mobility: Needs Assistance       Supine to sit: Supervision, HOB elevated, Used rails Sit to supine: Contact guard assist        Transfers Overall transfer level: Needs assistance Equipment used: Rolling walker (2 wheels)   Sit to Stand: Contact guard assist, From elevated  surface                  Balance     Sitting balance-Leahy Scale: Good       Standing balance-Leahy Scale: Fair            ADL either performed or assessed with clinical judgement   ADL   Eating/Feeding: Independent;Sitting   Grooming: Contact guard assist;Standing Grooming Details (indicate cue type and reason): at sink level         Upper Body Dressing : Set up;Sitting   Lower Body Dressing: Minimal assistance;Sit to/from stand   Toilet Transfer: Contact guard assist;Ambulation;Rolling walker (2 wheels);Grab bars;Regular Toilet   Toileting- Clothing Manipulation and Hygiene: Minimal assistance;Sit to/from stand               Vision   Additional Comments: She correctly read the time depicted on the wall clock.            Pertinent Vitals/Pain Pain Assessment Pain Assessment: 0-10 Pain Score: 8  Pain Location: chronic back, L shoulder and bilateral knee pain due to reported arthritis Pain Intervention(s): Limited activity within patient's tolerance, Monitored during session, Repositioned     Extremity/Trunk Assessment Upper Extremity Assessment Upper Extremity Assessment: B UE AROM and strength WFL   Lower Extremity Assessment Lower Extremity Assessment: Generalized weakness      Communication Communication Communication: No apparent difficulties   Cognition Arousal: Alert Behavior During Therapy: WFL for tasks assessed/performed Overall Cognitive Status: Within Functional Limits for tasks assessed            General Comments: Oriented x4, friendly, able to follow  commands without difficulty                Home Living Family/patient expects to be discharged to:: Private residence Living Arrangements: Alone Available Help at Discharge: Family;Available PRN/intermittently Type of Home: House Home Access: Ramped entrance     Home Layout: One level     Bathroom Shower/Tub: Walk-in shower         Home Equipment:  Rollator (4 wheels);Wheelchair - manual, shower chair   Additional Comments: Oxygen (4L all the time)      Prior Functioning/Environment Prior Level of Function : Needs assist             Mobility Comments: Uses rollator inside home and manual wheelchair for doctor's appointments ADLs Comments: Modified independent to independent with ADLs, except her daughter assisted with bathing;  her daughters also managed cooking and cleaning. She does not drive.         OT Problem List: Cardiopulmonary status limiting activity;Decreased activity tolerance;Decreased strength;Impaired balance (sitting and/or standing);Pain      OT Treatment/Interventions: Self-care/ADL training;Therapeutic exercise;Energy conservation;DME and/or AE instruction;Therapeutic activities;Balance training;Patient/family education    OT Goals(Current goals can be found in the care plan section) Acute Rehab OT Goals OT Goal Formulation: With patient Time For Goal Achievement: 01/30/23 Potential to Achieve Goals: Good ADL Goals Pt Will Perform Grooming: with modified independence;standing Pt Will Perform Lower Body Dressing: with modified independence;sit to/from stand Pt Will Transfer to Toilet: with modified independence;ambulating Pt Will Perform Toileting - Clothing Manipulation and hygiene: with modified independence;sit to/from stand  OT Frequency: Min 1X/week       AM-PAC OT "6 Clicks" Daily Activity     Outcome Measure Help from another person eating meals?: None Help from another person taking care of personal grooming?: A Little Help from another person toileting, which includes using toliet, bedpan, or urinal?: A Little Help from another person bathing (including washing, rinsing, drying)?: A Little Help from another person to put on and taking off regular upper body clothing?: A Little Help from another person to put on and taking off regular lower body clothing?: A Little 6 Click Score: 19   End  of Session Equipment Utilized During Treatment: Rolling walker (2 wheels);Gait belt;Oxygen Nurse Communication: Mobility status  Activity Tolerance: Other (comment) (Fair+ tolerance; Shortness of breath with activity noted) Patient left: in bed;with call bell/phone within reach;with chair alarm set  OT Visit Diagnosis: Unsteadiness on feet (R26.81);Muscle weakness (generalized) (M62.81)                Time: 3875-6433 OT Time Calculation (min): 20 min Charges:  OT General Charges $OT Visit: 1 Visit OT Evaluation $OT Eval Moderate Complexity: 1 Mod    Ayodele Hartsock L Annette Liotta, OTR/L 01/16/2023, 1:23 PM

## 2023-01-16 NOTE — Hospital Course (Signed)
Wendy Torres is a 82 y.o. female with a history of hypertension, COPD, chronic respiratory failure on 4 L/min of oxygen, pulmonary hypertension, chronic diastolic heart failure, anxiety, chronic pain syndrome, GERD, hyperlipidemia, stroke/TIA.  Patient presented secondary to shortness of breath and found to have evidence of a COPD exacerbation. This was treated with breathing treatments and steroids with quick improvement. During hospitalization, patient was noted to have black stool concerning for melena. FOBT ordered and GI consulted.

## 2023-01-16 NOTE — TOC Progression Note (Addendum)
Transition of Care [facility]) - Progression Note    Patient Details  Name: Wendy Torres MRN: [id number] Date of Birth: Dec 27, 1940  Transition of Care [facility]) CM/SW Contact  Arlys Scatena, Olegario Messier, RN Phone Number: 01/16/2023, 2:11 PM  Clinical Narrative: spoke to dtr Pamela-agree to home w/HHC-no preference-[facility] rep kelly HHPT/OT. Already has home 02-has travel tank. Has own transportation.      Expected Discharge Plan: Home w Home Health Services Barriers to Discharge: Continued Medical Work up  Expected Discharge Plan and Services   Discharge Planning Services: CM Consult Post Acute Care Choice: Home Health Living arrangements for the past 2 months: Single Family Home                           HH Arranged: PT, OT HH Agency: [facility] Date [facility] Agency Contacted: 01/16/23 Time HH Agency Contacted: 1411 Representative spoke with at [facility] Agency: Tresa Endo   Social Determinants of Health (SDOH) Interventions SDOH Screenings   Food Insecurity: No Food Insecurity (01/15/2023)  Housing: Low Risk  (01/15/2023)  Transportation Needs: No Transportation Needs (01/15/2023)  Utilities: Not At Risk (01/15/2023)  Alcohol Screen: Low Risk  (06/23/2022)  Depression (PHQ2-9): Low Risk  (12/25/2022)  Financial Resource Strain: Low Risk  (06/23/2022)  Physical Activity: Inactive (06/23/2022)  Social Connections: Moderately Integrated (06/23/2022)  Stress: No Stress Concern Present (06/23/2022)  Tobacco Use: Medium Risk (01/14/2023)    Readmission Risk Interventions     No data to display

## 2023-01-17 ENCOUNTER — Inpatient Hospital Stay (HOSPITAL_COMMUNITY): Payer: Medicare Other

## 2023-01-17 DIAGNOSIS — R195 Other fecal abnormalities: Secondary | ICD-10-CM

## 2023-01-17 DIAGNOSIS — D509 Iron deficiency anemia, unspecified: Secondary | ICD-10-CM

## 2023-01-17 DIAGNOSIS — J441 Chronic obstructive pulmonary disease with (acute) exacerbation: Secondary | ICD-10-CM | POA: Diagnosis not present

## 2023-01-17 LAB — OCCULT BLOOD X 1 CARD TO LAB, STOOL: Fecal Occult Bld: POSITIVE — AB

## 2023-01-17 MED ORDER — TRAMADOL HCL 50 MG PO TABS
100.0000 mg | ORAL_TABLET | Freq: Two times a day (BID) | ORAL | Status: DC | PRN
  Administered 2023-01-18: 100 mg via ORAL
  Filled 2023-01-17: qty 2

## 2023-01-17 MED ORDER — TRAMADOL HCL 50 MG PO TABS
50.0000 mg | ORAL_TABLET | Freq: Once | ORAL | Status: AC
  Administered 2023-01-17: 50 mg via ORAL
  Filled 2023-01-17: qty 1

## 2023-01-17 NOTE — Plan of Care (Signed)
  Problem: Pain Managment: Goal: General experience of comfort will improve Outcome: Progressing   Problem: Safety: Goal: Ability to remain free from injury will improve Outcome: Progressing   Problem: Skin Integrity: Goal: Risk for impaired skin integrity will decrease Outcome: Progressing   

## 2023-01-17 NOTE — Progress Notes (Signed)
Assumed care from off going RN. No changes in initial AM assessment at this time Family at the bedside

## 2023-01-17 NOTE — Progress Notes (Signed)
PROGRESS NOTE  Wendy Torres  ZOX:[id number] DOB: Dec 09, 1940 DOA: 01/14/2023 PCP: Corwin Levins, MD   Brief Narrative: Wendy Torres is a 82 y.o. female with a history of hypertension, COPD, chronic respiratory failure on 4 L/min of oxygen, pulmonary hypertension, chronic diastolic heart failure, anxiety, chronic pain syndrome, GERD, hyperlipidemia, stroke/TIA.  Patient presented secondary to shortness of breath and found to have evidence of a COPD exacerbation. This was treated with breathing treatments and steroids with quick improvement. During hospitalization, patient was noted to have black stool concerning for melena. FOBT ordered and GI consulted.   Assessment and Plan:  COPD exacerbation Patient with dyspnea on exertion with productive cough. Patient started on steroids and breathing treatments. Wheezing improved.  -Continue steroid bites. -Continue albuterol PRN -Incentive spirometer and flutter valve -PT/OT evaluations   Chronic respiratory failure with hypoxia Patient is currently at baseline oxygen. -Continue oxygen supplementation at 4 L/min will check ambulatory oxygen.   Pulmonary nodule Incidental measuring 1.4 cm located in the posteromedial right lower lobe seen on CT imaging. Recommendation for repeat imaging in three months. -Per radiology: Repeat chest CT or Follow-up PET-CT or Tissue sampling -Will recommend pulmonology follow-up as an outpatient  Black stools Possible melena. Normal BUN from metabolic panel on 9/16. -Discontinue aspirin and Lovenox -FOBT -Continue Protonix but increase to BID dosing -Per chart review, it appears patient was referred to [facility] for iron deficiency anemia - GI consulted EGD on 9/19.   Hypokalemia Mild. Resolved with repletion.   Primary hypertension -Continue amlodipine and losartan   Chronic diastolic heart failure Stable. Euvolemic.   Pulmonary hypertension Noted.  Leukocytosis Likely secondary  to steroids.   Anxiety -Continue Xanax PRN   Chronic pain syndrome -Continue Tramadol   GERD -Protonix   Hyperlipidemia -Pravastatin while inpatient   History of stroke/TIA Noted. -Hold aspirin secondary to possible GI bleed   DVT prophylaxis: SCDs Code Status:   Code Status: Full Code Family Communication: Daughter on telephone Disposition Plan: Discharge pending possible GI workup/management and PT/OT recommendations   Consultants:   GI  Procedures:  None  Antimicrobials: None    Subjective: Poor historian.  Intermittently complaining of nausea without any vomiting.  Also reports abdominal pain but no examination finding of tenderness.  Objective: BP (!) 173/74 (BP Location: Left Arm)   Pulse 87   Temp 98.2 F (36.8 C) (Oral)   Resp 20   Ht 5\' 4"  (1.626 m)   Wt 41.7 kg   SpO2 96%   BMI 15.78 kg/m   Examination:  Appears anxious. Appears short of breath. Clear to auscultation at the time of evaluation. Bowel sound present.  Nontender. No edema.   Data Reviewed: I have personally reviewed following labs and imaging studies  CBC Lab Results  Component Value Date   WBC 10.6 (H) 01/17/2023   RBC 3.98 01/17/2023   HGB 10.2 (L) 01/17/2023   HCT 32.9 (L) 01/17/2023   MCV 82.7 01/17/2023   MCH 25.6 (L) 01/17/2023   PLT 357 01/17/2023   MCHC 31.0 01/17/2023   RDW 14.9 01/17/2023   LYMPHSABS 1.9 12/19/2022   MONOABS 1.0 12/19/2022   EOSABS 0.2 12/19/2022   BASOSABS 0.1 12/19/2022     Last metabolic panel Lab Results  Component Value Date   NA 134 (L) 01/17/2023   K 3.4 (L) 01/17/2023   CL 101 01/17/2023   CO2 24 01/17/2023   BUN 19 01/17/2023   CREATININE 1.16 (H) 01/17/2023   GLUCOSE  102 (H) 01/17/2023   GFRNONAA 47 (L) 01/17/2023   GFRAA >60 08/26/2017   CALCIUM 9.7 01/17/2023   PROT 8.0 01/14/2023   ALBUMIN 3.7 01/14/2023   LABGLOB 4.0 (H) 03/16/2020   AGRATIO 0.9 03/16/2020   BILITOT 0.5 01/14/2023   ALKPHOS 66  01/14/2023   AST 15 01/14/2023   ALT 13 01/14/2023   ANIONGAP 9 01/17/2023    GFR: Estimated Creatinine Clearance: 24.2 mL/min (A) (by C-G formula based on SCr of 1.16 mg/dL (H)).  Recent Results (from the past 240 hour(s))  SARS Coronavirus 2 by RT PCR (hospital order, performed in [facility] lab) *cepheid single result test* Anterior Nasal Swab     Status: None   Collection Time: 01/15/23 12:15 AM   Specimen: Anterior Nasal Swab  Result Value Ref Range Status   SARS Coronavirus 2 by RT PCR NEGATIVE NEGATIVE Final    Comment: (NOTE) SARS-CoV-2 target nucleic acids are NOT DETECTED.  The SARS-CoV-2 RNA is generally detectable in upper and lower respiratory specimens during the acute phase of infection. The lowest concentration of SARS-CoV-2 viral copies this assay can detect is 250 copies / mL. A negative result does not preclude SARS-CoV-2 infection and should not be used as the sole basis for treatment or other patient management decisions.  A negative result may occur with improper specimen collection / handling, submission of specimen other than nasopharyngeal swab, presence of viral mutation(s) within the areas targeted by this assay, and inadequate number of viral copies (<250 copies / mL). A negative result must be combined with clinical observations, patient history, and epidemiological information.  Fact Sheet for Patients:   RoadLapTop.co.za  Fact Sheet for Healthcare Providers: http://kim-miller.com/  This test is not yet approved or  cleared by the [place] FDA and has been authorized for detection and/or diagnosis of SARS-CoV-2 by FDA under an Emergency Use Authorization (EUA).  This EUA will remain in effect (meaning this test can be used) for the duration of the COVID-19 declaration under Section 564(b)(1) of the Act, 21 U.S.C. section 360bbb-3(b)(1), unless the authorization is terminated or revoked  sooner.  Performed at [facility], [address], [place], [place] [address]       Radiology Studies: DG Abd Portable 1V  Result Date: 01/17/2023 CLINICAL DATA:  Constipation. EXAM: PORTABLE ABDOMEN - 1 VIEW COMPARISON:  None Available. FINDINGS: No bowel dilatation or evidence of obstruction. Small volume of formed stool in the colon. No evidence of free air on the supine view. No definite radiopaque calculi. Bilateral hip arthroplasties. IMPRESSION: Normal bowel gas pattern.  Small volume of stool in the colon. Electronically Signed   By: Narda Rutherford M.D.   On: 01/17/2023 16:27      LOS: 2 days    Lynden Oxford MD [facility] 01/17/2023, 7:32 PM   If 7PM-7AM, please contact night-coverage [email or web address]

## 2023-01-17 NOTE — Consult Note (Addendum)
Consultation  Primary Care Physician:  Corwin Levins, MD Primary Gastroenterologist:  Dr. Meridee Score (Dr. Christella Hartigan)      Reason for Consultation: Melena DOA: 01/14/2023         Hospital Day: 4         HPI:   Wendy Torres is a 82 y.o. female with past medical history significant for hypertension, COPD on 4 L home oxygen, former cigarette smoker, pulmonary hypertension, chronic HFpEF, anxiety, chronic pain syndrome, GERD, hiatal hernia, hyperlipidemia, and stroke/TIA. Patient presented to ED on 9/15 secondary to shortness of breath and found to have evidence of a COPD exacerbation. This was treated with breathing treatments and steroids with quick improvement. During hospitalization, patient was noted to have black stool concerning for melena. FOBT ordered and GI consulted.   Work up in ED notable for stable on 4L [place] at rest but sats dropped to the 80s with ambulation. Hypertensive with systolic in the 170s to 180s in the ED. Afebrile. Labs showing no leukocytosis, hemoglobin 9.3 (baseline 10-11), MCV 85.6, potassium 3.4, BNP normal, troponin negative, SARS-CoV-2 PCR negative. She was given albuterol 10 mg, Atrovent 1 mg, and Solu-Medrol 125 mg by EMS.   Patient lying in bed. Patient reports she came to ER for worsening shortness of breath, hx of COPD and on 4L [place] at home. During admission she also reported dark tarry stools, but she tells me today this has been going on for 2-3 months. She has been taking daily iron po for Iron deficiency for several years now. She reports one bowel movement daily with occasional straining. She takes Miralax at home. No abdominal pain. She also takes baby ASA hx of stroke, last dose on Sunday morning. No NSAIDs. No nausea or vomiting. Shortness of breath has improved with breathing treatment and steroids. No Chest pain or dizziness. Former smoker, stopped several years ago. No alcohol use. Lung cancer in her father.   Last colonoscopy/EGD 11/2014 with Dr  Rob Bunting for IDA (full report below)  Patient with family at bedside, daughter. Provided some of the history.     Previous GI workup:  01/2022 ESOPHAGUS/BARIUM SWALLOW/TABLET STUDY   C/o dysphagia IMPRESSION: 1. Dilated and patulous esophagus with signs of mild to moderate dysmotility. 2. Trace spontaneous reflux. 3. Patent esophagus without evidence for stricture or mass.  11/2014 EGD and colonoscopy  EGD with biopsy with Dr. Rob Bunting for iron deficiency anemia  Impression: There was mild, nonspecific distal gastritis.  This was biopsied and sent to pathology.  Examination was otherwise normal.  Colonoscopy for iron deficiency anemia and minor rectal bleeding  Impression: Polyp was found, removed and sent to pathology.  This was sessile located in transverse segment, 4 mm across, located in transverse segment.  Removed with cold snare.  There is a small internal hemorrhoids.  The examination was otherwise normal  Results: The biopsies showed typical colon polyp (adenoma). She does not need recall surveillance (age).   Biopsies of stomach showed mild gastritis. Negative for H. Pylori.  Other Imaging:   01/15/23 CT ANGIOGRAPHY CHEST WITH CONTRAST   IMPRESSION: 1. Negative for acute pulmonary embolism. 2. 1.4 cm subpleural nodule along the posteromedial right lower lobe may be due to rounded atelectasis however mass is not excluded. Consider one of the following in 3 months for both low-risk and high-risk individuals: (a) repeat chest CT, (b) follow-up PET-CT, or (c) tissue sampling. This recommendation follows the consensus statement: Guidelines for Management of  Incidental Pulmonary Nodules Detected on CT Images: From the Fleischner Society 2017; Radiology 2017; 284:228-243. 3. Enlarged mediastinal and hilar lymph nodes. Continued attention on follow-up. 4. Aortic Atherosclerosis (ICD10-I70.0) and Emphysema (ICD10-J43.9).  Abnormal ED labs: Abnormal Labs  Reviewed  CBC - Abnormal; Notable for the following components:      Result Value   RBC 3.62 (*)    Hemoglobin 9.3 (*)    HCT 31.0 (*)    MCH 25.7 (*)    All other components within normal limits  COMPREHENSIVE METABOLIC PANEL - Abnormal; Notable for the following components:   Potassium 3.4 (*)    Glucose, Bld 127 (*)    GFR, Estimated 57 (*)    All other components within normal limits  CBC - Abnormal; Notable for the following components:   RBC 3.58 (*)    Hemoglobin 9.0 (*)    HCT 31.4 (*)    MCH 25.1 (*)    MCHC 28.7 (*)    All other components within normal limits  BASIC METABOLIC PANEL - Abnormal; Notable for the following components:   Glucose, Bld 145 (*)    Creatinine, Ser 1.02 (*)    Calcium 10.6 (*)    GFR, Estimated 55 (*)    All other components within normal limits  CBC - Abnormal; Notable for the following components:   WBC 12.7 (*)    RBC 3.75 (*)    Hemoglobin 9.4 (*)    HCT 30.9 (*)    MCH 25.1 (*)    All other components within normal limits  CBC - Abnormal; Notable for the following components:   WBC 10.6 (*)    Hemoglobin 10.2 (*)    HCT 32.9 (*)    MCH 25.6 (*)    All other components within normal limits  BASIC METABOLIC PANEL - Abnormal; Notable for the following components:   Sodium 134 (*)    Potassium 3.4 (*)    Glucose, Bld 102 (*)    Creatinine, Ser 1.16 (*)    GFR, Estimated 47 (*)    All other components within normal limits  OCCULT BLOOD X 1 CARD TO LAB, STOOL - Abnormal; Notable for the following components:   Fecal Occult Bld POSITIVE (*)    All other components within normal limits    Past Medical History:  Diagnosis Date   ABNORMAL CHEST XRAY 11/05/2008   ACUTE URIS OF UNSPECIFIED SITE 10/22/2007   ALLERGIC RHINITIS 12/09/2006   ANEMIA 01/14/2009   Anxiety    Arthritis    ASTHMA 12/09/2006   Asthma    Cataract    bilateral -removed   CHF (congestive heart failure) (HCC)    Chronic pain syndrome 02/16/2009   COPD  12/09/2006   DEGENERATIVE JOINT DISEASE 12/09/2006   DIZZINESS 03/30/2010   DYSPNEA 01/27/2010   with heavy exertion   FREQUENCY, URINARY 03/30/2010   GERD 12/09/2006   Glossitis 01/14/2009   HELICOBACTER PYLORI GASTRITIS, HX OF 12/09/2006   HIATAL HERNIA 03/01/2010   History of hiatal hernia    HYPERLIPIDEMIA 04/18/2007   HYPERSOMNIA 08/31/2008   HYPERTENSION 12/09/2006   INSOMNIA-SLEEP DISORDER-UNSPEC 11/05/2008   LOW BACK PAIN 12/09/2006   Morbid obesity (HCC) 12/09/2006   Nonspecific (abnormal) findings on radiological and other examination of body structure 11/05/2008   OSTEOPOROSIS 12/09/2006   OVERACTIVE BLADDER 04/14/2008   PALPITATIONS, RECURRENT 01/27/2010   Stroke (HCC)    pt. stated light one years ago   SYNCOPE 11/05/2008   TRANSIENT ISCHEMIC ATTACK, HX  OF 12/09/2006    Surgical History:  She  has a past surgical history that includes Tubal ligation; Lumbar disc surgery; Colonoscopy; Tonsillectomy; Total hip arthroplasty (Right, 11/06/2012); and Total hip arthroplasty (Left, 02/18/2014). Family History:  Her family history includes Heart disease in her mother; Hypertension in her brother; Lung cancer in her father. Social History:   reports that she quit smoking about 50 years ago. Her smoking use included cigarettes. She started smoking about 75 years ago. She has a 12.5 pack-year smoking history. She has never used smokeless tobacco. She reports that she does not drink alcohol and does not use drugs.  Prior to Admission medications   Medication Sig Start Date End Date Taking? Authorizing Provider  acetaminophen (TYLENOL) 325 MG tablet Take 2 tablets (650 mg total) by mouth every 6 (six) hours as needed for mild pain (or Fever >/= 101). Patient taking differently: Take 650 mg by mouth every 6 (six) hours as needed for mild pain. 02/19/14  Yes Perkins, Alexzandrew L, PA-C  albuterol (VENTOLIN HFA) 108 (90 Base) MCG/ACT inhaler INHALE 2 PUFFS INTO THE LUNGS EVERY 4  HOURS AS NEEDED FOR WHEEZING OR SHORTNESS OF BREATH. Patient taking differently: Inhale 2 puffs into the lungs as needed for wheezing or shortness of breath. 11/01/21  Yes Verlee Monte, MD  ALPRAZolam Prudy Feeler) 0.25 MG tablet TAKE 1 TABLET BY MOUTH TWICE A DAY AS NEEDED Patient taking differently: Take 0.25 mg by mouth as needed for anxiety. 10/04/22  Yes Corwin Levins, MD  amLODipine (NORVASC) 10 MG tablet Take 1 tablet (10 mg total) by mouth daily. 11/14/22  Yes Corwin Levins, MD  aspirin EC 81 MG tablet Take 81 mg by mouth daily. Swallow whole.   Yes [provider]  cloNIDine (CATAPRES) 0.1 MG tablet TAKE 1 TABLET BY MOUTH TWICE A DAY 11/23/22  Yes Corwin Levins, MD  Fexofenadine HCl [facility] ALLERGY PO) Take 1 tablet by mouth as needed (congestion).   Yes [provider]  Fluticasone-Umeclidin-Vilant (TRELEGY ELLIPTA) 200-62.5-25 MCG/ACT AEPB Inhale 1 puff into the lungs daily. 03/08/22  Yes Mannam, Praveen, MD  furosemide (LASIX) 20 MG tablet TAKE 1 TABLET BY MOUTH IN THE  MORNING 09/26/22  Yes Corwin Levins, MD  gabapentin (NEURONTIN) 300 MG capsule TAKE 2 CAPSULE BY MOUTH 3 TIMES  DAILY 09/18/22  Yes Corwin Levins, MD  hydrALAZINE (APRESOLINE) 100 MG tablet Take 1 tablet (100 mg total) by mouth 3 (three) times daily. 12/25/22  Yes Corwin Levins, MD  iron polysaccharides (NU-IRON) 150 MG capsule Take 1 capsule (150 mg total) by mouth daily. 11/10/22  Yes Corwin Levins, MD  losartan (COZAAR) 100 MG tablet Take 1 tablet (100 mg total) by mouth daily. 11/14/22  Yes Corwin Levins, MD  lovastatin (MEVACOR) 40 MG tablet TAKE 1 TABLET BY MOUTH AT  BEDTIME 06/08/22  Yes Corwin Levins, MD  meclizine (ANTIVERT) 12.5 MG tablet TAKE 1 TABLET BY MOUTH 3 TIMES A DAY AS NEEDED FOR DIZZINESS Patient taking differently: Take 12.5 mg by mouth 3 (three) times daily as needed for dizziness or nausea. 06/22/21  Yes Corwin Levins, MD  nystatin (MYCOSTATIN) 100000 UNIT/ML suspension Swish and spit 5mL two times  daily after using Symbicort. Patient taking differently: Use as directed 5 mLs in the mouth or throat in the morning and at bedtime. Swish and spit 11/09/22  Yes Corwin Levins, MD  pantoprazole (PROTONIX) 40 MG tablet TAKE 1 TABLET BY MOUTH DAILY  Patient taking differently: Take 40 mg by mouth 2 (two) times daily. 06/26/22  Yes Corwin Levins, MD  polyethylene glycol (MIRALAX / GLYCOLAX) 17 g packet Take 17 g by mouth daily as needed for mild constipation or moderate constipation.   Yes [provider]  solifenacin (VESICARE) 10 MG tablet Take 1 tablet (10 mg total) by mouth daily. 11/23/22  Yes Corwin Levins, MD  tiZANidine (ZANAFLEX) 4 MG tablet TAKE 1 TABLET BY MOUTH EVERY 6 HOURS AS NEEDED FOR MUSCLE SPASMS. Patient taking differently: Take 4 mg by mouth every 6 (six) hours as needed for muscle spasms. 11/23/22  Yes Corwin Levins, MD  traMADol (ULTRAM) 50 MG tablet Take 1 tablet (50 mg total) by mouth every 6 (six) hours as needed. for pain Patient taking differently: Take 50 mg by mouth in the morning and at bedtime. 12/25/22  Yes Corwin Levins, MD  traZODone (DESYREL) 50 MG tablet TAKE 1/2 TO 1 TABLET BY MOUTH AT BEDTIME AS NEEDED FOR SLEEP Patient taking differently: Take 25-50 mg by mouth at bedtime as needed for sleep. 11/23/22  Yes Corwin Levins, MD  triamcinolone cream (KENALOG) 0.1 % APPLY TO AFFECTED AREA TWICE A DAY Patient taking differently: Apply 1 Application topically as needed (eczema). 07/10/22  Yes Corwin Levins, MD  Turmeric (QC TUMERIC COMPLEX PO) Take 1,000 mg by mouth daily.   Yes [provider]  fluocinolone (SYNALAR) 0.01 % external solution APPLY TOPICALLY 2 TIMES DAILY AS NEEDED. Patient not taking: Reported on 01/15/2023 07/03/22   Verlee Monte, MD    Current Facility-Administered Medications  Medication Dose Route Frequency Provider Last Rate Last Admin   acetaminophen (TYLENOL) tablet 650 mg  650 mg Oral Q6H PRN John Giovanni, MD   650 mg at  01/17/23 1035   Or   acetaminophen (TYLENOL) suppository 650 mg  650 mg Rectal Q6H PRN John Giovanni, MD       albuterol (PROVENTIL) (2.5 MG/3ML) 0.083% nebulizer solution 2.5 mg  2.5 mg Nebulization Q4H PRN John Giovanni, MD       ALPRAZolam Prudy Feeler) tablet 0.25 mg  0.25 mg Oral BID PRN Narda Bonds, MD   0.25 mg at 01/16/23 2124   amLODipine (NORVASC) tablet 10 mg  10 mg Oral Daily Narda Bonds, MD   10 mg at 01/17/23 0853   cloNIDine (CATAPRES) tablet 0.1 mg  0.1 mg Oral BID Narda Bonds, MD   0.1 mg at 01/17/23 0853   doxycycline (VIBRA-TABS) tablet 100 mg  100 mg Oral Q12H Narda Bonds, MD   100 mg at 01/17/23 0853   guaiFENesin (MUCINEX) 12 hr tablet 600 mg  600 mg Oral BID Narda Bonds, MD   600 mg at 01/17/23 0853   guaiFENesin-dextromethorphan (ROBITUSSIN DM) 100-10 MG/5ML syrup 5 mL  5 mL Oral Q4H PRN John Giovanni, MD       ipratropium-albuterol (DUONEB) 0.5-2.5 (3) MG/3ML nebulizer solution 3 mL  3 mL Nebulization TID Narda Bonds, MD   3 mL at 01/17/23 0739   labetalol (NORMODYNE) injection 5 mg  5 mg Intravenous Q2H PRN John Giovanni, MD   5 mg at 01/16/23 0355   losartan (COZAAR) tablet 100 mg  100 mg Oral Daily Narda Bonds, MD   100 mg at 01/17/23 0853   pantoprazole (PROTONIX) EC tablet 40 mg  40 mg Oral BID Narda Bonds, MD   40 mg at 01/17/23 0853   pravastatin (PRAVACHOL)  tablet 40 mg  40 mg Oral q1800 Narda Bonds, MD   40 mg at 01/16/23 1647   predniSONE (DELTASONE) tablet 50 mg  50 mg Oral Q breakfast Narda Bonds, MD   50 mg at 01/17/23 1610   traMADol (ULTRAM) tablet 100 mg  100 mg Oral Q12H PRN Rolly Salter, MD        Allergies as of 01/14/2023 - Review Complete 01/14/2023  Allergen Reaction Noted   Chlorthalidone  10/15/2020    Review of Systems:    Constitutional: No weight loss, fever, chills, weakness or fatigue HEENT: Eyes: No change in vision               Ears, Nose, Throat:  No change in hearing or  congestion Skin: No rash or itching Cardiovascular: No chest pain, chest pressure or palpitations   Respiratory: No SOB or cough Gastrointestinal: See HPI and otherwise negative Genitourinary: No dysuria or change in urinary frequency Neurological: No headache, dizziness or syncope Musculoskeletal: No new muscle or joint pain Hematologic: No bleeding or bruising Psychiatric: No history of depression or anxiety     Physical Exam:  Vital signs in last 24 hours: Temp:  [98.2 F (36.8 C)-98.7 F (37.1 C)] 98.7 F (37.1 C) (09/18 0505) Pulse Rate:  [75-89] 75 (09/18 0505) Resp:  [17-20] 19 (09/18 0505) BP: (164-172)/(73-82) 172/82 (09/18 0505) SpO2:  [95 %-100 %] 96 % (09/18 0739) FiO2 (%):  [36 %] 36 % (09/18 0739) Last BM Date : 01/17/23 Last BM recorded by nurses in past 5 days Stool Type: Type 2 (Lump and sausage like) (01/17/2023 10:00 AM)  General:   Pleasant, well developed female in no acute distress Head:  Normocephalic and atraumatic. Eyes: sclerae anicteric,conjunctive pink  Heart:  regular rate and rhythm, no murmurs Pulm: diminished lung sounds bilaterally, on 4L [place] Abdomen:  Soft, Obese AB, Hypoactive bowel sounds. No tenderness. Without guarding and Without rebound, No organomegaly appreciated. Extremities:  Without edema. Msk:  Symmetrical without gross deformities. Peripheral pulses intact.  Neurologic:  Alert and  oriented x4;  No focal deficits.  Skin:   Dry and intact without significant lesions or rashes. Psychiatric:  Cooperative. Normal mood and affect.  LAB RESULTS: Recent Labs    01/15/23 0826 01/16/23 0700 01/17/23 0527  WBC 6.8 12.7* 10.6*  HGB 9.0* 9.4* 10.2*  HCT 31.4* 30.9* 32.9*  PLT 337 365 357   BMET Recent Labs    01/14/23 2342 01/15/23 0826 01/17/23 0527  NA 136 135 134*  K 3.4* 4.1 3.4*  CL 98 100 101  CO2 32 26 24  GLUCOSE 127* 145* 102*  BUN 14 14 19   CREATININE 0.99 1.02* 1.16*  CALCIUM 10.2 10.6* 9.7   LFT Recent Labs     01/14/23 2342  PROT 8.0  ALBUMIN 3.7  AST 15  ALT 13  ALKPHOS 66  BILITOT 0.5   PT/INR No results for input(s): "LABPROT", "INR" in the last 72 hours.  STUDIES: No results found.   Impression /Plan:  Melena- 82 year old patient admitted for COPD exacerbation presents with melena and positive FOBT. Normal BUN. S/p dilatation 2011, last EGD 2016 unremarkable. Negative h.pylori. On home ASA, last dose Sunday am. Reports she has had dark tarry stools for several months. She is taking oral iron daily for IDA. Hgb at baseline. Iron (10/2022) 34, ferritin 13.9. (10/2022) B12- 299. -Continue daily CBC with transfusion as needed to maintain Hgb >7.  -Continue Protonix 40 mg  po BID -Clear liquid diet -Consider proceeding with endoscopy, patient not really interested in doing colonoscopy. I thoroughly discussed the procedure to include nature, alternatives, benefits, and risks including but not limited to bleeding, perforation, infection, anesthesia/cardiac and pulmonary complications. Patient provides understanding   Anemia, chronic several years (2011). Also has Hx of IDA- taking oral iron at home for several years. Baseline 10-11. During admission- 9.3>9.0>9.4>10.2. Fecal occult positive. Iron (10/2022) 34, ferritin 13.9. (10/2022) B12- 299. Negative prior workup for iron deficiency in 2016.  GERD, chronic. Patient reports controlled with home Pantoprazole 40 mg po daily. Denies dysphagia today.  Hx of stroke. On home Aspirin. Last dose Sunday (9/15) morning.  Leukocytosis 8.9 on admission, 10.6 today. Likely secondary to steroids. Afebrile. Chest xray shows atelectasis or infiltrates in the lower lungs.   Shortness of breath with exacerbation of COPD. Negative CTA for PE. On 4L [place] which is her baseline. -on solumedrol and albuterol  Congestive heart failure. Stable. (9/22) Echo- Left ventricular ejection fraction, by estimation, is 60 to 65%.   Hyponatremia, borderline 134. BUN 19, Creat  1.16  Hypokalemia 4.1>3.4 -replete per protocol   History of tubular adenoma 2016 Colonoscopy excellent prep 1 tubular adenoma polyp 4 mm transverse colon, internal hemorrhoids, no recall secondary to age.  Thank you for your kind consultation, we will continue to follow.   Deanna J May  01/17/2023, 12:13 PM    Attending Physician Note   I have taken a history, reviewed the chart and examined the patient. I performed a substantive portion of this encounter, including complete performance of at least one of the key components, in conjunction with the APP. I agree with the APP's note, impression and recommendations with my edits. My additional impressions and recommendations are as follows.   Black stools, FOBT +, BUN normal, chronic stable IDA and GERD. Daily ASA use. R/O ulcer, gastritis, AVMs, esophagitis. Pt is high risk for endoscopic procedures and anesthesia.  Discussed possible EGD and colonoscopy.  She is agreeable to proceed with EGD however she does not want to proceed with colonoscopy.  Continue pantoprazole 40 mg twice daily.  Trend CBC.  Schedule EGD for tomorrow.  Chronic respiratory failure with hypoxia. COPD exacerbation resolving.  Personal history of adenomatous colon polyps - no longer in surveillance due to comorbidities and age.  Claudette Head, MD [facility] See [facility], [facility], for our on call provider

## 2023-01-17 NOTE — H&P (View-Only) (Signed)
Consultation  Primary Care Physician:  Corwin Levins, MD Primary Gastroenterologist:  Dr. Meridee Score (Dr. Christella Hartigan)      Reason for Consultation: Melena DOA: 01/14/2023         Hospital Day: 4         HPI:   Wendy Torres is a 82 y.o. female with past medical history significant for hypertension, COPD on 4 L home oxygen, former cigarette smoker, pulmonary hypertension, chronic HFpEF, anxiety, chronic pain syndrome, GERD, hiatal hernia, hyperlipidemia, and stroke/TIA. Patient presented to ED on 9/15 secondary to shortness of breath and found to have evidence of a COPD exacerbation. This was treated with breathing treatments and steroids with quick improvement. During hospitalization, patient was noted to have black stool concerning for melena. FOBT ordered and GI consulted.   Work up in ED notable for stable on 4L [place] at rest but sats dropped to the 80s with ambulation. Hypertensive with systolic in the 170s to 180s in the ED. Afebrile. Labs showing no leukocytosis, hemoglobin 9.3 (baseline 10-11), MCV 85.6, potassium 3.4, BNP normal, troponin negative, SARS-CoV-2 PCR negative. She was given albuterol 10 mg, Atrovent 1 mg, and Solu-Medrol 125 mg by EMS.   Patient lying in bed. Patient reports she came to ER for worsening shortness of breath, hx of COPD and on 4L [place] at home. During admission she also reported dark tarry stools, but she tells me today this has been going on for 2-3 months. She has been taking daily iron po for Iron deficiency for several years now. She reports one bowel movement daily with occasional straining. She takes Miralax at home. No abdominal pain. She also takes baby ASA hx of stroke, last dose on Sunday morning. No NSAIDs. No nausea or vomiting. Shortness of breath has improved with breathing treatment and steroids. No Chest pain or dizziness. Former smoker, stopped several years ago. No alcohol use. Lung cancer in her father.   Last colonoscopy/EGD 11/2014 with Dr  Rob Bunting for IDA (full report below)  Patient with family at bedside, daughter. Provided some of the history.     Previous GI workup:  01/2022 ESOPHAGUS/BARIUM SWALLOW/TABLET STUDY   C/o dysphagia IMPRESSION: 1. Dilated and patulous esophagus with signs of mild to moderate dysmotility. 2. Trace spontaneous reflux. 3. Patent esophagus without evidence for stricture or mass.  11/2014 EGD and colonoscopy  EGD with biopsy with Dr. Rob Bunting for iron deficiency anemia  Impression: There was mild, nonspecific distal gastritis.  This was biopsied and sent to pathology.  Examination was otherwise normal.  Colonoscopy for iron deficiency anemia and minor rectal bleeding  Impression: Polyp was found, removed and sent to pathology.  This was sessile located in transverse segment, 4 mm across, located in transverse segment.  Removed with cold snare.  There is a small internal hemorrhoids.  The examination was otherwise normal  Results: The biopsies showed typical colon polyp (adenoma). She does not need recall surveillance (age).   Biopsies of stomach showed mild gastritis. Negative for H. Pylori.  Other Imaging:   01/15/23 CT ANGIOGRAPHY CHEST WITH CONTRAST   IMPRESSION: 1. Negative for acute pulmonary embolism. 2. 1.4 cm subpleural nodule along the posteromedial right lower lobe may be due to rounded atelectasis however mass is not excluded. Consider one of the following in 3 months for both low-risk and high-risk individuals: (a) repeat chest CT, (b) follow-up PET-CT, or (c) tissue sampling. This recommendation follows the consensus statement: Guidelines for Management of  Incidental Pulmonary Nodules Detected on CT Images: From the Fleischner Society 2017; Radiology 2017; 284:228-243. 3. Enlarged mediastinal and hilar lymph nodes. Continued attention on follow-up. 4. Aortic Atherosclerosis (ICD10-I70.0) and Emphysema (ICD10-J43.9).  Abnormal ED labs: Abnormal Labs  Reviewed  CBC - Abnormal; Notable for the following components:      Result Value   RBC 3.62 (*)    Hemoglobin 9.3 (*)    HCT 31.0 (*)    MCH 25.7 (*)    All other components within normal limits  COMPREHENSIVE METABOLIC PANEL - Abnormal; Notable for the following components:   Potassium 3.4 (*)    Glucose, Bld 127 (*)    GFR, Estimated 57 (*)    All other components within normal limits  CBC - Abnormal; Notable for the following components:   RBC 3.58 (*)    Hemoglobin 9.0 (*)    HCT 31.4 (*)    MCH 25.1 (*)    MCHC 28.7 (*)    All other components within normal limits  BASIC METABOLIC PANEL - Abnormal; Notable for the following components:   Glucose, Bld 145 (*)    Creatinine, Ser 1.02 (*)    Calcium 10.6 (*)    GFR, Estimated 55 (*)    All other components within normal limits  CBC - Abnormal; Notable for the following components:   WBC 12.7 (*)    RBC 3.75 (*)    Hemoglobin 9.4 (*)    HCT 30.9 (*)    MCH 25.1 (*)    All other components within normal limits  CBC - Abnormal; Notable for the following components:   WBC 10.6 (*)    Hemoglobin 10.2 (*)    HCT 32.9 (*)    MCH 25.6 (*)    All other components within normal limits  BASIC METABOLIC PANEL - Abnormal; Notable for the following components:   Sodium 134 (*)    Potassium 3.4 (*)    Glucose, Bld 102 (*)    Creatinine, Ser 1.16 (*)    GFR, Estimated 47 (*)    All other components within normal limits  OCCULT BLOOD X 1 CARD TO LAB, STOOL - Abnormal; Notable for the following components:   Fecal Occult Bld POSITIVE (*)    All other components within normal limits    Past Medical History:  Diagnosis Date   ABNORMAL CHEST XRAY 11/05/2008   ACUTE URIS OF UNSPECIFIED SITE 10/22/2007   ALLERGIC RHINITIS 12/09/2006   ANEMIA 01/14/2009   Anxiety    Arthritis    ASTHMA 12/09/2006   Asthma    Cataract    bilateral -removed   CHF (congestive heart failure) (HCC)    Chronic pain syndrome 02/16/2009   COPD  12/09/2006   DEGENERATIVE JOINT DISEASE 12/09/2006   DIZZINESS 03/30/2010   DYSPNEA 01/27/2010   with heavy exertion   FREQUENCY, URINARY 03/30/2010   GERD 12/09/2006   Glossitis 01/14/2009   HELICOBACTER PYLORI GASTRITIS, HX OF 12/09/2006   HIATAL HERNIA 03/01/2010   History of hiatal hernia    HYPERLIPIDEMIA 04/18/2007   HYPERSOMNIA 08/31/2008   HYPERTENSION 12/09/2006   INSOMNIA-SLEEP DISORDER-UNSPEC 11/05/2008   LOW BACK PAIN 12/09/2006   Morbid obesity (HCC) 12/09/2006   Nonspecific (abnormal) findings on radiological and other examination of body structure 11/05/2008   OSTEOPOROSIS 12/09/2006   OVERACTIVE BLADDER 04/14/2008   PALPITATIONS, RECURRENT 01/27/2010   Stroke (HCC)    pt. stated light one years ago   SYNCOPE 11/05/2008   TRANSIENT ISCHEMIC ATTACK, HX  OF 12/09/2006    Surgical History:  She  has a past surgical history that includes Tubal ligation; Lumbar disc surgery; Colonoscopy; Tonsillectomy; Total hip arthroplasty (Right, 11/06/2012); and Total hip arthroplasty (Left, 02/18/2014). Family History:  Her family history includes Heart disease in her mother; Hypertension in her brother; Lung cancer in her father. Social History:   reports that she quit smoking about 50 years ago. Her smoking use included cigarettes. She started smoking about 75 years ago. She has a 12.5 pack-year smoking history. She has never used smokeless tobacco. She reports that she does not drink alcohol and does not use drugs.  Prior to Admission medications   Medication Sig Start Date End Date Taking? Authorizing Provider  acetaminophen (TYLENOL) 325 MG tablet Take 2 tablets (650 mg total) by mouth every 6 (six) hours as needed for mild pain (or Fever >/= 101). Patient taking differently: Take 650 mg by mouth every 6 (six) hours as needed for mild pain. 02/19/14  Yes Perkins, Alexzandrew L, PA-C  albuterol (VENTOLIN HFA) 108 (90 Base) MCG/ACT inhaler INHALE 2 PUFFS INTO THE LUNGS EVERY 4  HOURS AS NEEDED FOR WHEEZING OR SHORTNESS OF BREATH. Patient taking differently: Inhale 2 puffs into the lungs as needed for wheezing or shortness of breath. 11/01/21  Yes Verlee Monte, MD  ALPRAZolam Prudy Feeler) 0.25 MG tablet TAKE 1 TABLET BY MOUTH TWICE A DAY AS NEEDED Patient taking differently: Take 0.25 mg by mouth as needed for anxiety. 10/04/22  Yes Corwin Levins, MD  amLODipine (NORVASC) 10 MG tablet Take 1 tablet (10 mg total) by mouth daily. 11/14/22  Yes Corwin Levins, MD  aspirin EC 81 MG tablet Take 81 mg by mouth daily. Swallow whole.   Yes [provider]  cloNIDine (CATAPRES) 0.1 MG tablet TAKE 1 TABLET BY MOUTH TWICE A DAY 11/23/22  Yes Corwin Levins, MD  Fexofenadine HCl [facility] ALLERGY PO) Take 1 tablet by mouth as needed (congestion).   Yes [provider]  Fluticasone-Umeclidin-Vilant (TRELEGY ELLIPTA) 200-62.5-25 MCG/ACT AEPB Inhale 1 puff into the lungs daily. 03/08/22  Yes Mannam, Praveen, MD  furosemide (LASIX) 20 MG tablet TAKE 1 TABLET BY MOUTH IN THE  MORNING 09/26/22  Yes Corwin Levins, MD  gabapentin (NEURONTIN) 300 MG capsule TAKE 2 CAPSULE BY MOUTH 3 TIMES  DAILY 09/18/22  Yes Corwin Levins, MD  hydrALAZINE (APRESOLINE) 100 MG tablet Take 1 tablet (100 mg total) by mouth 3 (three) times daily. 12/25/22  Yes Corwin Levins, MD  iron polysaccharides (NU-IRON) 150 MG capsule Take 1 capsule (150 mg total) by mouth daily. 11/10/22  Yes Corwin Levins, MD  losartan (COZAAR) 100 MG tablet Take 1 tablet (100 mg total) by mouth daily. 11/14/22  Yes Corwin Levins, MD  lovastatin (MEVACOR) 40 MG tablet TAKE 1 TABLET BY MOUTH AT  BEDTIME 06/08/22  Yes Corwin Levins, MD  meclizine (ANTIVERT) 12.5 MG tablet TAKE 1 TABLET BY MOUTH 3 TIMES A DAY AS NEEDED FOR DIZZINESS Patient taking differently: Take 12.5 mg by mouth 3 (three) times daily as needed for dizziness or nausea. 06/22/21  Yes Corwin Levins, MD  nystatin (MYCOSTATIN) 100000 UNIT/ML suspension Swish and spit 5mL two times  daily after using Symbicort. Patient taking differently: Use as directed 5 mLs in the mouth or throat in the morning and at bedtime. Swish and spit 11/09/22  Yes Corwin Levins, MD  pantoprazole (PROTONIX) 40 MG tablet TAKE 1 TABLET BY MOUTH DAILY  Patient taking differently: Take 40 mg by mouth 2 (two) times daily. 06/26/22  Yes Corwin Levins, MD  polyethylene glycol (MIRALAX / GLYCOLAX) 17 g packet Take 17 g by mouth daily as needed for mild constipation or moderate constipation.   Yes [provider]  solifenacin (VESICARE) 10 MG tablet Take 1 tablet (10 mg total) by mouth daily. 11/23/22  Yes Corwin Levins, MD  tiZANidine (ZANAFLEX) 4 MG tablet TAKE 1 TABLET BY MOUTH EVERY 6 HOURS AS NEEDED FOR MUSCLE SPASMS. Patient taking differently: Take 4 mg by mouth every 6 (six) hours as needed for muscle spasms. 11/23/22  Yes Corwin Levins, MD  traMADol (ULTRAM) 50 MG tablet Take 1 tablet (50 mg total) by mouth every 6 (six) hours as needed. for pain Patient taking differently: Take 50 mg by mouth in the morning and at bedtime. 12/25/22  Yes Corwin Levins, MD  traZODone (DESYREL) 50 MG tablet TAKE 1/2 TO 1 TABLET BY MOUTH AT BEDTIME AS NEEDED FOR SLEEP Patient taking differently: Take 25-50 mg by mouth at bedtime as needed for sleep. 11/23/22  Yes Corwin Levins, MD  triamcinolone cream (KENALOG) 0.1 % APPLY TO AFFECTED AREA TWICE A DAY Patient taking differently: Apply 1 Application topically as needed (eczema). 07/10/22  Yes Corwin Levins, MD  Turmeric (QC TUMERIC COMPLEX PO) Take 1,000 mg by mouth daily.   Yes [provider]  fluocinolone (SYNALAR) 0.01 % external solution APPLY TOPICALLY 2 TIMES DAILY AS NEEDED. Patient not taking: Reported on 01/15/2023 07/03/22   Verlee Monte, MD    Current Facility-Administered Medications  Medication Dose Route Frequency Provider Last Rate Last Admin   acetaminophen (TYLENOL) tablet 650 mg  650 mg Oral Q6H PRN John Giovanni, MD   650 mg at  01/17/23 1035   Or   acetaminophen (TYLENOL) suppository 650 mg  650 mg Rectal Q6H PRN John Giovanni, MD       albuterol (PROVENTIL) (2.5 MG/3ML) 0.083% nebulizer solution 2.5 mg  2.5 mg Nebulization Q4H PRN John Giovanni, MD       ALPRAZolam Prudy Feeler) tablet 0.25 mg  0.25 mg Oral BID PRN Narda Bonds, MD   0.25 mg at 01/16/23 2124   amLODipine (NORVASC) tablet 10 mg  10 mg Oral Daily Narda Bonds, MD   10 mg at 01/17/23 0853   cloNIDine (CATAPRES) tablet 0.1 mg  0.1 mg Oral BID Narda Bonds, MD   0.1 mg at 01/17/23 0853   doxycycline (VIBRA-TABS) tablet 100 mg  100 mg Oral Q12H Narda Bonds, MD   100 mg at 01/17/23 0853   guaiFENesin (MUCINEX) 12 hr tablet 600 mg  600 mg Oral BID Narda Bonds, MD   600 mg at 01/17/23 0853   guaiFENesin-dextromethorphan (ROBITUSSIN DM) 100-10 MG/5ML syrup 5 mL  5 mL Oral Q4H PRN John Giovanni, MD       ipratropium-albuterol (DUONEB) 0.5-2.5 (3) MG/3ML nebulizer solution 3 mL  3 mL Nebulization TID Narda Bonds, MD   3 mL at 01/17/23 0739   labetalol (NORMODYNE) injection 5 mg  5 mg Intravenous Q2H PRN John Giovanni, MD   5 mg at 01/16/23 0355   losartan (COZAAR) tablet 100 mg  100 mg Oral Daily Narda Bonds, MD   100 mg at 01/17/23 0853   pantoprazole (PROTONIX) EC tablet 40 mg  40 mg Oral BID Narda Bonds, MD   40 mg at 01/17/23 0853   pravastatin (PRAVACHOL)  tablet 40 mg  40 mg Oral q1800 Narda Bonds, MD   40 mg at 01/16/23 1647   predniSONE (DELTASONE) tablet 50 mg  50 mg Oral Q breakfast Narda Bonds, MD   50 mg at 01/17/23 1610   traMADol (ULTRAM) tablet 100 mg  100 mg Oral Q12H PRN Rolly Salter, MD        Allergies as of 01/14/2023 - Review Complete 01/14/2023  Allergen Reaction Noted   Chlorthalidone  10/15/2020    Review of Systems:    Constitutional: No weight loss, fever, chills, weakness or fatigue HEENT: Eyes: No change in vision               Ears, Nose, Throat:  No change in hearing or  congestion Skin: No rash or itching Cardiovascular: No chest pain, chest pressure or palpitations   Respiratory: No SOB or cough Gastrointestinal: See HPI and otherwise negative Genitourinary: No dysuria or change in urinary frequency Neurological: No headache, dizziness or syncope Musculoskeletal: No new muscle or joint pain Hematologic: No bleeding or bruising Psychiatric: No history of depression or anxiety     Physical Exam:  Vital signs in last 24 hours: Temp:  [98.2 F (36.8 C)-98.7 F (37.1 C)] 98.7 F (37.1 C) (09/18 0505) Pulse Rate:  [75-89] 75 (09/18 0505) Resp:  [17-20] 19 (09/18 0505) BP: (164-172)/(73-82) 172/82 (09/18 0505) SpO2:  [95 %-100 %] 96 % (09/18 0739) FiO2 (%):  [36 %] 36 % (09/18 0739) Last BM Date : 01/17/23 Last BM recorded by nurses in past 5 days Stool Type: Type 2 (Lump and sausage like) (01/17/2023 10:00 AM)  General:   Pleasant, well developed female in no acute distress Head:  Normocephalic and atraumatic. Eyes: sclerae anicteric,conjunctive pink  Heart:  regular rate and rhythm, no murmurs Pulm: diminished lung sounds bilaterally, on 4L [place] Abdomen:  Soft, Obese AB, Hypoactive bowel sounds. No tenderness. Without guarding and Without rebound, No organomegaly appreciated. Extremities:  Without edema. Msk:  Symmetrical without gross deformities. Peripheral pulses intact.  Neurologic:  Alert and  oriented x4;  No focal deficits.  Skin:   Dry and intact without significant lesions or rashes. Psychiatric:  Cooperative. Normal mood and affect.  LAB RESULTS: Recent Labs    01/15/23 0826 01/16/23 0700 01/17/23 0527  WBC 6.8 12.7* 10.6*  HGB 9.0* 9.4* 10.2*  HCT 31.4* 30.9* 32.9*  PLT 337 365 357   BMET Recent Labs    01/14/23 2342 01/15/23 0826 01/17/23 0527  NA 136 135 134*  K 3.4* 4.1 3.4*  CL 98 100 101  CO2 32 26 24  GLUCOSE 127* 145* 102*  BUN 14 14 19   CREATININE 0.99 1.02* 1.16*  CALCIUM 10.2 10.6* 9.7   LFT Recent Labs     01/14/23 2342  PROT 8.0  ALBUMIN 3.7  AST 15  ALT 13  ALKPHOS 66  BILITOT 0.5   PT/INR No results for input(s): "LABPROT", "INR" in the last 72 hours.  STUDIES: No results found.   Impression /Plan:  Melena- 82 year old patient admitted for COPD exacerbation presents with melena and positive FOBT. Normal BUN. S/p dilatation 2011, last EGD 2016 unremarkable. Negative h.pylori. On home ASA, last dose Sunday am. Reports she has had dark tarry stools for several months. She is taking oral iron daily for IDA. Hgb at baseline. Iron (10/2022) 34, ferritin 13.9. (10/2022) B12- 299. -Continue daily CBC with transfusion as needed to maintain Hgb >7.  -Continue Protonix 40 mg  po BID -Clear liquid diet -Consider proceeding with endoscopy, patient not really interested in doing colonoscopy. I thoroughly discussed the procedure to include nature, alternatives, benefits, and risks including but not limited to bleeding, perforation, infection, anesthesia/cardiac and pulmonary complications. Patient provides understanding   Anemia, chronic several years (2011). Also has Hx of IDA- taking oral iron at home for several years. Baseline 10-11. During admission- 9.3>9.0>9.4>10.2. Fecal occult positive. Iron (10/2022) 34, ferritin 13.9. (10/2022) B12- 299. Negative prior workup for iron deficiency in 2016.  GERD, chronic. Patient reports controlled with home Pantoprazole 40 mg po daily. Denies dysphagia today.  Hx of stroke. On home Aspirin. Last dose Sunday (9/15) morning.  Leukocytosis 8.9 on admission, 10.6 today. Likely secondary to steroids. Afebrile. Chest xray shows atelectasis or infiltrates in the lower lungs.   Shortness of breath with exacerbation of COPD. Negative CTA for PE. On 4L [place] which is her baseline. -on solumedrol and albuterol  Congestive heart failure. Stable. (9/22) Echo- Left ventricular ejection fraction, by estimation, is 60 to 65%.   Hyponatremia, borderline 134. BUN 19, Creat  1.16  Hypokalemia 4.1>3.4 -replete per protocol   History of tubular adenoma 2016 Colonoscopy excellent prep 1 tubular adenoma polyp 4 mm transverse colon, internal hemorrhoids, no recall secondary to age.  Thank you for your kind consultation, we will continue to follow.   Deanna J May  01/17/2023, 12:13 PM    Attending Physician Note   I have taken a history, reviewed the chart and examined the patient. I performed a substantive portion of this encounter, including complete performance of at least one of the key components, in conjunction with the APP. I agree with the APP's note, impression and recommendations with my edits. My additional impressions and recommendations are as follows.   Black stools, FOBT +, BUN normal, chronic stable IDA and GERD. Daily ASA use. R/O ulcer, gastritis, AVMs, esophagitis. Pt is high risk for endoscopic procedures and anesthesia.  Discussed possible EGD and colonoscopy.  She is agreeable to proceed with EGD however she does not want to proceed with colonoscopy.  Continue pantoprazole 40 mg twice daily.  Trend CBC.  Schedule EGD for tomorrow.  Chronic respiratory failure with hypoxia. COPD exacerbation resolving.  Personal history of adenomatous colon polyps - no longer in surveillance due to comorbidities and age.  Claudette Head, MD [facility] See [facility], [facility], for our on call provider

## 2023-01-17 NOTE — Progress Notes (Signed)
Mobility Specialist - Progress Note   01/17/23 1427  Mobility  Activity Transferred from bed to chair  Level of Assistance Standby assist, set-up cues, supervision of patient - no hands on  Assistive Device Front wheel walker  Distance Ambulated (ft) 2 ft  Activity Response Tolerated well  Mobility Referral Yes  $Mobility charge 1 Mobility  Mobility Specialist Start Time (ACUTE ONLY) 0221  Mobility Specialist Stop Time (ACUTE ONLY) 0227  Mobility Specialist Time Calculation (min) (ACUTE ONLY) 6 min   Pt received in bed declining mobility but agreeable to transfer to recliner. No complaints during transfer. Pt to recliner after session with all needs met.    [facility]

## 2023-01-17 NOTE — Plan of Care (Signed)
Cont with plan of care

## 2023-01-18 ENCOUNTER — Encounter (HOSPITAL_COMMUNITY)
Admission: EM | Disposition: A | Discharge status: Home Health | Payer: Self-pay | Source: Home / Self Care | Attending: Internal Medicine

## 2023-01-18 ENCOUNTER — Inpatient Hospital Stay (HOSPITAL_COMMUNITY): Payer: Medicare Other | Admitting: Anesthesiology

## 2023-01-18 ENCOUNTER — Encounter (HOSPITAL_COMMUNITY): Payer: Self-pay | Admitting: Internal Medicine

## 2023-01-18 DIAGNOSIS — J441 Chronic obstructive pulmonary disease with (acute) exacerbation: Secondary | ICD-10-CM | POA: Diagnosis not present

## 2023-01-18 DIAGNOSIS — I110 Hypertensive heart disease with heart failure: Secondary | ICD-10-CM

## 2023-01-18 DIAGNOSIS — K269 Duodenal ulcer, unspecified as acute or chronic, without hemorrhage or perforation: Secondary | ICD-10-CM

## 2023-01-18 DIAGNOSIS — K31819 Angiodysplasia of stomach and duodenum without bleeding: Secondary | ICD-10-CM | POA: Diagnosis not present

## 2023-01-18 DIAGNOSIS — K5520 Angiodysplasia of colon without hemorrhage: Secondary | ICD-10-CM

## 2023-01-18 DIAGNOSIS — Z87891 Personal history of nicotine dependence: Secondary | ICD-10-CM

## 2023-01-18 DIAGNOSIS — I5022 Chronic systolic (congestive) heart failure: Secondary | ICD-10-CM

## 2023-01-18 DIAGNOSIS — K229 Disease of esophagus, unspecified: Secondary | ICD-10-CM | POA: Diagnosis not present

## 2023-01-18 DIAGNOSIS — J449 Chronic obstructive pulmonary disease, unspecified: Secondary | ICD-10-CM

## 2023-01-18 DIAGNOSIS — R195 Other fecal abnormalities: Secondary | ICD-10-CM

## 2023-01-18 HISTORY — PX: ESOPHAGOGASTRODUODENOSCOPY (EGD) WITH PROPOFOL: SHX5813

## 2023-01-18 HISTORY — PX: BIOPSY: SHX5522

## 2023-01-18 HISTORY — PX: HOT HEMOSTASIS: SHX5433

## 2023-01-18 LAB — COMPREHENSIVE METABOLIC PANEL
ALT: 20 U/L (ref 0–44)
AST: 24 U/L (ref 15–41)
Albumin: 3.5 g/dL (ref 3.5–5.0)
Alkaline Phosphatase: 60 U/L (ref 38–126)
Anion gap: 9 (ref 5–15)
BUN: 20 mg/dL (ref 8–23)
CO2: 25 mmol/L (ref 22–32)
Calcium: 9.6 mg/dL (ref 8.9–10.3)
Chloride: 101 mmol/L (ref 98–111)
Creatinine, Ser: 0.99 mg/dL (ref 0.44–1.00)
GFR, Estimated: 57 mL/min — ABNORMAL LOW (ref >60)
Glucose, Bld: 98 mg/dL (ref 70–99)
Potassium: 3.2 mmol/L — ABNORMAL LOW (ref 3.5–5.1)
Sodium: 135 mmol/L (ref 135–145)
Total Bilirubin: 0.6 mg/dL (ref 0.3–1.2)
Total Protein: 7.6 g/dL (ref 6.5–8.1)

## 2023-01-18 LAB — CBC
HCT: 33.7 % — ABNORMAL LOW (ref 36.0–46.0)
Hemoglobin: 10.4 g/dL — ABNORMAL LOW (ref 12.0–15.0)
MCH: 25.8 pg — ABNORMAL LOW (ref 26.0–34.0)
MCHC: 30.9 g/dL (ref 30.0–36.0)
MCV: 83.6 fL (ref 80.0–100.0)
Platelets: 366 10*3/uL (ref 150–400)
RBC: 4.03 MIL/uL (ref 3.87–5.11)
RDW: 14.8 % (ref 11.5–15.5)
WBC: 11 10*3/uL — ABNORMAL HIGH (ref 4.0–10.5)
nRBC: 0 % (ref 0.0–0.2)

## 2023-01-18 SURGERY — ESOPHAGOGASTRODUODENOSCOPY (EGD) WITH PROPOFOL
Anesthesia: Monitor Anesthesia Care

## 2023-01-18 MED ORDER — LACTATED RINGERS IV SOLN: INTRAVENOUS | Status: DC

## 2023-01-18 MED ORDER — FLUCONAZOLE 100 MG PO TABS
200.0000 mg | ORAL_TABLET | Freq: Once | ORAL | Status: AC
  Administered 2023-01-18: 200 mg via ORAL
  Filled 2023-01-18: qty 2

## 2023-01-18 MED ORDER — POTASSIUM CHLORIDE CRYS ER 20 MEQ PO TBCR
40.0000 meq | EXTENDED_RELEASE_TABLET | Freq: Once | ORAL | Status: AC
  Administered 2023-01-18: 40 meq via ORAL
  Filled 2023-01-18: qty 2

## 2023-01-18 MED ORDER — SODIUM CHLORIDE 0.9 % IV SOLN: INTRAVENOUS | Status: DC

## 2023-01-18 MED ORDER — PROPOFOL 500 MG/50ML IV EMUL
INTRAVENOUS | Status: DC | PRN
  Administered 2023-01-18: 150 ug/kg/min via INTRAVENOUS

## 2023-01-18 MED ORDER — PROPOFOL 10 MG/ML IV BOLUS
INTRAVENOUS | Status: DC | PRN
  Administered 2023-01-18: 25 mg via INTRAVENOUS

## 2023-01-18 SURGICAL SUPPLY — 15 items
BLOCK BITE 60FR ADLT L/F BLUE (MISCELLANEOUS) ×3 IMPLANT
ELECT REM PT RETURN 9FT ADLT (ELECTROSURGICAL) IMPLANT
ELECTRODE REM PT RTRN 9FT ADLT (ELECTROSURGICAL) IMPLANT
FORCEP RJ3 GP 1.8X160 W-NEEDLE (CUTTING FORCEPS) IMPLANT
FORCEPS BIOP RAD 4 LRG CAP 4 (CUTTING FORCEPS) IMPLANT
NDL SCLEROTHERAPY 25GX240 (NEEDLE) IMPLANT
NEEDLE SCLEROTHERAPY 25GX240 (NEEDLE) IMPLANT
PROBE APC STR FIRE (PROBE) IMPLANT
PROBE INJECTION GOLD (MISCELLANEOUS)
PROBE INJECTION GOLD 7FR (MISCELLANEOUS) IMPLANT
SNARE SHORT THROW 13M SML OVAL (MISCELLANEOUS) IMPLANT
SYR 50ML LL SCALE MARK (SYRINGE) IMPLANT
TUBING ENDO SMARTCAP PENTAX (MISCELLANEOUS) ×6 IMPLANT
TUBING IRRIGATION ENDOGATOR (MISCELLANEOUS) ×3 IMPLANT
WATER STERILE IRR 1000ML POUR (IV SOLUTION) IMPLANT

## 2023-01-18 NOTE — Anesthesia Preprocedure Evaluation (Addendum)
Anesthesia Evaluation  Patient identified by MRN, date of birth, ID band Patient awake    Reviewed: Allergy & Precautions, NPO status , Patient's Chart, lab work & pertinent test results  History of Anesthesia Complications Negative for: history of anesthetic complications  Airway Mallampati: II  TM Distance: >3 FB Neck ROM: Full    Dental  (+) Dental Advisory Given, Edentulous Lower, Edentulous Upper   Pulmonary asthma , COPD,  COPD inhaler and oxygen dependent, former smoker   Pulmonary exam normal        Cardiovascular hypertension, Pt. on medications Normal cardiovascular exam     Neuro/Psych  PSYCHIATRIC DISORDERS Anxiety     CVA, No Residual Symptoms    GI/Hepatic Neg liver ROS, hiatal hernia,GERD  Medicated and Controlled,,  Endo/Other   Obesity K 3.2   Renal/GU negative Renal ROS  Female GU complaint     Musculoskeletal  (+) Arthritis ,    Abdominal   Peds  Hematology  (+) Blood dyscrasia, anemia   Anesthesia Other Findings   Reproductive/Obstetrics                              Anesthesia Physical Anesthesia Plan  ASA: 4  Anesthesia Plan: MAC   Post-op Pain Management:    Induction:   PONV Risk Score and Plan: 2 and Propofol infusion and Treatment may vary due to age or medical condition  Airway Management Planned: Nasal Cannula and Natural Airway  Additional Equipment: None  Intra-op Plan:   Post-operative Plan:   Informed Consent: I have reviewed the patients History and Physical, chart, labs and discussed the procedure including the risks, benefits and alternatives for the proposed anesthesia with the patient or authorized representative who has indicated his/her understanding and acceptance.       Plan Discussed with: CRNA and Anesthesiologist  Anesthesia Plan Comments:          Anesthesia Quick Evaluation

## 2023-01-18 NOTE — Plan of Care (Signed)
  Problem: Pain Managment: Goal: General experience of comfort will improve Outcome: Progressing   Problem: Safety: Goal: Ability to remain free from injury will improve Outcome: Progressing   Problem: Pain Managment: Goal: General experience of comfort will improve Outcome: Progressing   Problem: Safety: Goal: Ability to remain free from injury will improve Outcome: Progressing   Problem: Pain Managment: Goal: General experience of comfort will improve Outcome: Progressing   Problem: Safety: Goal: Ability to remain free from injury will improve Outcome: Progressing

## 2023-01-18 NOTE — Progress Notes (Signed)
PT Cancellation Note  Patient Details Name: MICAELA AIRINGTON MRN: [id number] DOB: January 06, 1941   Cancelled Treatment:     Pt out of room in Radiology for an EGD.  Will attempt to see another day as schedule permits   Felecia Shelling  PTA Acute  Rehabilitation Services Office M-F          [phone]

## 2023-01-18 NOTE — Interval H&P Note (Signed)
History and Physical Interval Note:  01/18/2023 2:55 PM  Wendy Torres  has presented today for surgery, with the diagnosis of melena, positive fecal occult, iron deficiency anemia.  The various methods of treatment have been discussed with the patient and family. After consideration of risks, benefits and other options for treatment, the patient has consented to  Procedure(s): ESOPHAGOGASTRODUODENOSCOPY (EGD) WITH PROPOFOL (N/A) as a surgical intervention.  The patient's history has been reviewed, patient examined, no change in status, stable for surgery.  I have reviewed the patient's chart and labs.  Questions were answered to the patient's satisfaction.     Venita Lick. Russella Dar

## 2023-01-18 NOTE — Anesthesia Postprocedure Evaluation (Signed)
Anesthesia Post Note  Patient: Wendy Torres  Procedure(s) Performed: ESOPHAGOGASTRODUODENOSCOPY (EGD) WITH PROPOFOL HOT HEMOSTASIS (ARGON PLASMA COAGULATION/BICAP)     Patient location during evaluation: Endoscopy Anesthesia Type: MAC Level of consciousness: awake and alert, patient cooperative and oriented Pain management: pain level controlled Vital Signs Assessment: post-procedure vital signs reviewed and stable Respiratory status: nonlabored ventilation, spontaneous breathing, respiratory function stable and patient connected to nasal cannula oxygen Cardiovascular status: blood pressure returned to baseline and stable Postop Assessment: no apparent nausea or vomiting Anesthetic complications: no   No notable events documented.  Last Vitals:  Vitals:   01/18/23 1550 01/18/23 1600  BP: (!) 162/81 (!) 163/76  Pulse: 82 75  Resp: 18 20  Temp:    SpO2: 93% 92%    Last Pain:  Vitals:   01/18/23 1600  TempSrc:   PainSc: 8                  Jenyfer Trawick,E. Mitsue Peery

## 2023-01-18 NOTE — Plan of Care (Signed)

## 2023-01-18 NOTE — Transfer of Care (Signed)
Immediate Anesthesia Transfer of Care Note  Patient: Wendy Torres  Procedure(s) Performed: ESOPHAGOGASTRODUODENOSCOPY (EGD) WITH PROPOFOL HOT HEMOSTASIS (ARGON PLASMA COAGULATION/BICAP)  Patient Location: PACU  Anesthesia Type:MAC  Level of Consciousness: sedated, patient cooperative, and responds to stimulation  Airway & Oxygen Therapy: Patient Spontanous Breathing and Patient connected to face mask oxygen  Post-op Assessment: Report given to RN and Post -op Vital signs reviewed and stable  Post vital signs: Reviewed and stable  Last Vitals:  Vitals Value Taken Time  BP 127/73 01/18/23 1536  Temp    Pulse 87 01/18/23 1538  Resp 23 01/18/23 1538  SpO2 93 % 01/18/23 1538  Vitals shown include unfiled device data.  Last Pain:  Vitals:   01/18/23 0900  TempSrc: Oral  PainSc: 0-No pain      Patients Stated Pain Goal: 4 (01/15/23 1656)  Complications: No notable events documented.

## 2023-01-18 NOTE — Op Note (Signed)
[facility] Patient Name: Wendy Torres Procedure Date: 01/18/2023 MRN: [id number] Attending MD: Meryl Dare , MD, [phone] Date of Birth: 1940-11-08 CSN: [id number] Age: 82 Admit Type: Inpatient Procedure:                Upper GI endoscopy Indications:              Iron deficiency anemia, Heme positive stool Providers:                Venita Lick. Russella Dar, MD, Margaree Mackintosh, RN,                            Harrington Challenger, Technician Referring MD:             [facility] Medicines:                 Complications:            No immediate complications. Estimated Blood Loss:     Estimated blood loss was minimal. Procedure:                Pre-Anesthesia Assessment:                           - Prior to the procedure, a History and Physical                            was performed, and patient medications and                            allergies were reviewed. The patient's tolerance of                            previous anesthesia was also reviewed. The risks                            and benefits of the procedure and the sedation                            options and risks were discussed with the patient.                            All questions were answered, and informed consent                            was obtained. Prior Anticoagulants: The patient has                            taken no anticoagulant or antiplatelet agents. ASA                            Grade Assessment: III - A patient with severe                            systemic disease. After reviewing the risks and  benefits, the patient was deemed in satisfactory                            condition to undergo the procedure.                           After obtaining informed consent, the endoscope was                            passed under direct vision. Throughout the                            procedure, the patient's blood pressure, pulse, and                            oxygen  saturations were monitored continuously. The                            [id number]-[id number] ([id number] Olympus endoscope was introduced                            through the mouth, and advanced to the second part                            of duodenum. The upper GI endoscopy was                            accomplished without difficulty. The patient                            tolerated the procedure well. Scope In: Scope Out: Findings:      Localized, white plaques were found in the middle third of the       esophagus. Biopsies were taken with a cold forceps for histology.      The exam of the esophagus was otherwise normal.      Striped moderately erythematous mucosa without bleeding was found in the       gastric antrum. Biopsies were taken with a cold forceps for histology       from the antrum and body.      The exam of the stomach was otherwise normal.      Two non-bleeding superficial duodenal ulcers with clean ulcer bases       (Forrest Class III) were found in the duodenal bulb. The largest lesion       was 6 mm in largest dimension.      Four 3 to 4 mm angioectasias without bleeding were found in the duodenal       bulb and in the second portion of the duodenum. Coagulation for bleeding       prevention using argon plasma was successful.      The exam of the duodenum was otherwise normal. Impression:               - Esophageal plaques were found, suspicious for                            candidiasis.  Biopsied.                           - Erythematous mucosa in the antrum. Biopsied.                           - Two small non-bleeding duodenal ulcers with clean                            ulcer bases (Forrest Class III).                           - Four non-bleeding angioectasias in the duodenum.                            Treated with argon plasma coagulation (APC). Moderate Sedation:      Not Applicable - Patient had care per Anesthesia. Recommendation:           - Return patient to  hospital ward for ongoing care.                           - Resume previous diet today.                           - Continue present medications.                           - Pantoprazole 40 mg po bid in hospital then                            pantoprazoel 40 mg po qd as outpatient.                           - Continue iron replacement.                           - Await pathology results.                           - Outpatient GI follow up as needed with Dr.                            Meridee Score.                           - Outpatient follow up with PCP for IDA mgmt. Procedure Code(s):        --- Professional ---                           43255, 59, Esophagogastroduodenoscopy, flexible,                            transoral; with control of bleeding, any method                           43239, Esophagogastroduodenoscopy, flexible,  transoral; with biopsy, single or multiple Diagnosis Code(s):        --- Professional ---                           K22.9, Disease of esophagus, unspecified                           K31.89, Other diseases of stomach and duodenum                           K26.9, Duodenal ulcer, unspecified as acute or                            chronic, without hemorrhage or perforation                           K31.819, Angiodysplasia of stomach and duodenum                            without bleeding                           D50.9, Iron deficiency anemia, unspecified                           R19.5, Other fecal abnormalities CPT copyright 2022 American Medical Association. All rights reserved. The codes documented in this report are preliminary and upon coder review may  be revised to meet current compliance requirements. Meryl Dare, MD 01/18/2023 3:40:51 PM This report has been signed electronically. Number of Addenda: 0

## 2023-01-18 NOTE — Progress Notes (Signed)
Returned to room from EGD procedure. Family at bedside. Pt reports pain in the left side/back. Dr Russella Dar notified family at bedside wanting to speak with him regarding procedure.

## 2023-01-18 NOTE — Progress Notes (Signed)
[facility] Progress Note Patient: Wendy Torres ZOX:[id number] DOB: 08-07-1940 DOA: 01/14/2023  DOS: the patient was seen and examined on 01/18/2023  Brief hospital course: PMH of HTN, COPD, chronic respiratory failure on 4 LPM, pulmonary hypertension, chronic HFpEF, anxiety, chronic pain, GERD, HLD, CVA presents to the hospital with complaints of shortness of breath.  Currently being treated for COPD exacerbation. Also appears to have melena.  Underwent EGD with [facility].  Assessment and Plan: COPD exacerbation. Chest x-ray clear. Oxygenation improving to close to baseline. No wheezing at the time of my evaluation. Continue steroids and nebulizer therapy.  Chronic respiratory failure. On 4 LPM currently on 4 LPM. Monitor.  Pulmonary nodule. Will require follow-up CT chest for 1.4 cm size right lower lobe nodule.  Melena. Underwent EGD. 2 small nonbleeding duodenal ulcers seen as well as erythematous mucosa. Also for nonbleeding angiectasia seen. Treated with APC. Holding aspirin and Lovenox.  HTN. Blood pressure stable Continue.  Hypokalemia Monitor.  Pulmonary hypertension. Seen on the echocardiogram. Monitor.  Anxiety better Xanax.  Continue.  Chronic pain syndrome. On tramadol twice daily as needed 100 mg.  Likely resume.  GERD. Continue PPI.  HLD. On pravastatin.  Possible esophageal candidiasis. Will initiate Diflucan therapy.  Subjective: No nausea no vomiting no fever no chills.  No chest pain.  Physical Exam: General: in Mild distress, No Rash Cardiovascular: S1 and S2 Present, No Murmur Respiratory: Good respiratory effort, Bilateral Air entry present. No Crackles, No wheezes Abdomen: Bowel Sound present, No tenderness Extremities: No edema Neuro: Alert and oriented x3, no new focal deficit  Data Reviewed: I have Reviewed nursing notes, Vitals, and Lab results. Since last encounter, pertinent lab results CBC and BMP   . I have  ordered test including CBC  .  Discussed with GI. Disposition: Status is: Inpatient Remains inpatient appropriate because: Awaiting stability overnight.  Place and maintain sequential compression device Start: 01/16/23 1157   Family Communication: Discussed with daughter Level of care: Telemetry   Vitals:   01/18/23 1540 01/18/23 1550 01/18/23 1600 01/18/23 1645  BP: 137/71 (!) 162/81 (!) 163/76 (!) 173/72  Pulse: 88 82 75 74  Resp: 14 18 20    Temp:      TempSrc:      SpO2: 97% 93% 92%   Weight:      Height:         Author: Lynden Oxford, MD 01/18/2023 5:44 PM  Please look on [email or web address] to find out who is on call.

## 2023-01-19 DIAGNOSIS — J441 Chronic obstructive pulmonary disease with (acute) exacerbation: Secondary | ICD-10-CM | POA: Diagnosis not present

## 2023-01-19 MED ORDER — DOXYCYCLINE HYCLATE 100 MG PO TABS: 100.0000 mg | ORAL_TABLET | Freq: Two times a day (BID) | ORAL | 0 refills | Status: AC

## 2023-01-19 MED ORDER — DOXYCYCLINE HYCLATE 100 MG PO TABS
100.0000 mg | ORAL_TABLET | Freq: Two times a day (BID) | ORAL | Status: DC
  Administered 2023-01-19: 100 mg via ORAL
  Filled 2023-01-19: qty 1

## 2023-01-19 MED ORDER — IPRATROPIUM-ALBUTEROL 0.5-2.5 (3) MG/3ML IN SOLN: 3.0000 mL | Freq: Two times a day (BID) | RESPIRATORY_TRACT | Status: DC

## 2023-01-19 MED ORDER — FLUCONAZOLE 100 MG PO TABS
200.0000 mg | ORAL_TABLET | Freq: Every day | ORAL | Status: DC
  Administered 2023-01-19: 200 mg via ORAL
  Filled 2023-01-19: qty 2

## 2023-01-19 MED ORDER — FLUCONAZOLE 200 MG PO TABS: 200.0000 mg | ORAL_TABLET | Freq: Every day | ORAL | 0 refills | Status: AC

## 2023-01-19 MED ORDER — LOVASTATIN 40 MG PO TABS: 40.0000 mg | ORAL_TABLET | Freq: Every day | ORAL | Status: DC

## 2023-01-19 MED ORDER — PREDNISONE 10 MG PO TABS: ORAL_TABLET | ORAL | 0 refills | Status: DC

## 2023-01-19 MED ORDER — SENNOSIDES-DOCUSATE SODIUM 8.6-50 MG PO TABS
1.0000 | ORAL_TABLET | Freq: Two times a day (BID) | ORAL | Status: DC
  Administered 2023-01-19: 1 via ORAL
  Filled 2023-01-19: qty 1

## 2023-01-19 MED ORDER — GUAIFENESIN ER 600 MG PO TB12: 600.0000 mg | ORAL_TABLET | Freq: Two times a day (BID) | ORAL | 0 refills | Status: AC

## 2023-01-19 MED ORDER — PREDNISONE 5 MG PO TABS
30.0000 mg | ORAL_TABLET | Freq: Every day | ORAL | Status: DC
  Administered 2023-01-19: 30 mg via ORAL
  Filled 2023-01-19: qty 2

## 2023-01-19 MED ORDER — ASPIRIN 81 MG PO TBEC: 81.0000 mg | DELAYED_RELEASE_TABLET | Freq: Every day | ORAL | Status: AC

## 2023-01-19 MED ORDER — POLYETHYLENE GLYCOL 3350 17 G PO PACK
17.0000 g | PACK | Freq: Every day | ORAL | Status: DC
  Administered 2023-01-19: 17 g via ORAL
  Filled 2023-01-19: qty 1

## 2023-01-19 MED ORDER — ORAL CARE MOUTH RINSE: 15.0000 mL | OROMUCOSAL | Status: DC | PRN

## 2023-01-19 NOTE — TOC Transition Note (Addendum)
Transition of Care [facility]) - CM/SW Discharge Note   Patient Details  Name: Wendy Torres MRN: [id number] Date of Birth: [birth year]  [facility]) CM/SW Contact:  Erin Sons, LCSW Phone Number: 01/19/2023, 1:11 PM   Clinical Narrative:     CSW notified [facility] of pt's DC today. Pt states her daughter will transport her home and bring oxygen tank for the ride home.    Final next level of care: Home w Home Health Services Barriers to Discharge: No Barriers Identified   Patient Goals and CMS Choice CMS Medicare.gov Compare Post Acute Care list provided to:: Patient Represenative (must comment)    Discharge Placement                         Discharge Plan and Services Additional resources added to the After Visit Summary for     Discharge Planning Services: CM Consult Post Acute Care Choice: Home Health                    HH Arranged: PT, OT [facility] Agency: [facility] Date [facility] Agency Contacted: 01/16/23 Time HH Agency Contacted: 1411 Representative spoke with at [facility] Agency: Tresa Endo  Social Determinants of Health (SDOH) Interventions SDOH Screenings   Food Insecurity: No Food Insecurity (01/15/2023)  Housing: Low Risk  (01/15/2023)  Transportation Needs: No Transportation Needs (01/15/2023)  Utilities: Not At Risk (01/15/2023)  Alcohol Screen: Low Risk  (06/23/2022)  Depression (PHQ2-9): Low Risk  (12/25/2022)  Financial Resource Strain: Low Risk  (06/23/2022)  Physical Activity: Inactive (06/23/2022)  Social Connections: Moderately Integrated (06/23/2022)  Stress: No Stress Concern Present (06/23/2022)  Tobacco Use: Medium Risk (01/18/2023)     Readmission Risk Interventions     No data to display

## 2023-01-19 NOTE — Progress Notes (Addendum)
Occupational Therapy Treatment Patient Details Name: Wendy Torres MRN: [id number] DOB: 1940/06/25 Today's Date: 01/19/2023   History of present illness 82 yr old female admitted with COPD exacerbation. Hx of COPD, DOE, CHF, LTHA, chronic anemia, OA   OT comments  The pt was seen for progression of ADL participation and ADL instruction. She ambulated to and from the bathroom in her room using a RW, performed toileting at bathroom level, and grooming in sitting at chair level. She was instructed on implementing deep breathing exercises and therapeutic rest breaks as needed, given compromised endurance and shortness of breath with activity. Continue OT plan of care. Continue to recommended home health OT.       If plan is discharge home, recommend the following:  Assist for transportation;Assistance with cooking/housework;Help with stairs or ramp for entrance   Equipment Recommendations  None recommended by OT    Recommendations for Other Services      Precautions / Restrictions Precautions Precautions: Fall Precaution Comments: 4L O2 baseline Restrictions Weight Bearing Restrictions: No       Mobility Bed Mobility    General bed mobility comments: pt was received in the bedside chair    Transfers Overall transfer level: Needs assistance Equipment used: Rolling walker (2 wheels) Transfers: Sit to/from Stand Sit to Stand: Contact guard assist                     ADL either performed or assessed with clinical judgement   ADL Overall ADL's : Needs assistance/impaired Eating/Feeding: Independent;Sitting Eating/Feeding Details (indicate cue type and reason): based on clinical judgement Grooming: Set up;Sitting Grooming Details (indicate cue type and reason): She performed face and hand washing seated in the bedside chair.                 Toilet Transfer: Supervision/safety;Ambulation;Rolling walker (2 wheels);Regular Toilet;Grab bars Toilet Transfer Details  (indicate cue type and reason): She ambulated to and from the bathroom in her room using a RW. Toileting- Clothing Manipulation and Hygiene: Supervision/safety;Sit to/from stand Toileting - Clothing Manipulation Details (indicate cue type and reason): She performed toileting tasks at bathroom level.  She required supervision, including for performing peri-hygiene in standing, as well as clothing management.              Cognition Arousal: Alert Behavior During Therapy: WFL for tasks assessed/performed Overall Cognitive Status: Within Functional Limits for tasks assessed        General Comments: Oriented x4, friendly, able to follow commands without difficulty                   Pertinent Vitals/ Pain       Pain Assessment Pain Assessment: Faces Pain Score: 4  Pain Location: chronic back pain Pain Intervention(s): Limited activity within patient's tolerance, Monitored during session, Repositioned         Frequency  Min 1X/week        Progress Toward Goals  OT Goals(current goals can now be found in the care plan section)  Progress towards OT goals: Progressing toward goals  Acute Rehab OT Goals OT Goal Formulation: With patient Time For Goal Achievement: 01/30/23 Potential to Achieve Goals: Good  Plan         AM-PAC OT "6 Clicks" Daily Activity     Outcome Measure   Help from another person eating meals?: None Help from another person taking care of personal grooming?: None Help from another person toileting, which includes using toliet, bedpan, or urinal?:  A Little Help from another person bathing (including washing, rinsing, drying)?: A Little Help from another person to put on and taking off regular upper body clothing?: A Little Help from another person to put on and taking off regular lower body clothing?: A Little 6 Click Score: 20    End of Session Equipment Utilized During Treatment: Rolling walker (2 wheels);Oxygen  OT Visit Diagnosis:  Unsteadiness on feet (R26.81);Muscle weakness (generalized) (M62.81);Pain Pain - part of body:  (chronic back pain)   Activity Tolerance Other (comment) (Fair+ tolerance. Endurance deficits noted)   Patient Left in chair;with call bell/phone within reach;with chair alarm set   Nurse Communication Other (comment) (Pt requesting Miralax and medication for anxiety)        Time: 1002-1030 OT Time Calculation (min): 28 min  Charges: OT General Charges $OT Visit: 1 Visit OT Treatments $Self Care/Home Management : 8-22 mins $Therapeutic Activity: 8-22 mins     Reuben Likes, OTR/L 01/19/2023, 1:08 PM

## 2023-01-19 NOTE — Progress Notes (Signed)
Physical Therapy Treatment Patient Details Name: Wendy Torres MRN: [id number] DOB: 04-Jan-1941 Today's Date: 01/19/2023   History of Present Illness 82 yo female admitted with COPD exac. Hx of COPD, DOE, CHF, LTHA, chronic anemia, OA    PT Comments  Pt reports recently mobilizing to restroom with nursing, declines ambulating but agreeable to seated exercises. Pt tolerates seated exercises, on 4L O2 with SpO2 99%. Pt requests therapeutic rest breaks between exercises due to pain and fatigue. Educated on ambulating to restroom and hallway with nursing as able throughout the day and pt verbalizes agreement.    If plan is discharge home, recommend the following: A little help with bathing/dressing/bathroom;Assistance with cooking/housework;Assist for transportation;Help with stairs or ramp for entrance   Can travel by private vehicle        Equipment Recommendations  None recommended by PT    Recommendations for Other Services       Precautions / Restrictions Precautions Precautions: Fall Precaution Comments: 4L O2 baseline Restrictions Weight Bearing Restrictions: No     Mobility  Bed Mobility                    Transfers                   General transfer comment: sitting in recliner, repositioning as able, declines mobility due to walking to restroom with nursing earlier and now fatigued    Ambulation/Gait                   Stairs             Wheelchair Mobility     Tilt Bed    Modified Rankin (Stroke Patients Only)       Balance                                            Cognition Arousal: Alert Behavior During Therapy: WFL for tasks assessed/performed Overall Cognitive Status: Within Functional Limits for tasks assessed                                          Exercises General Exercises - Lower Extremity Ankle Circles/Pumps: Seated, AROM, Both, 20 reps Long Arc Quad: Seated, AROM,  Strengthening, Both, 10 reps Hip Flexion/Marching: Seated, AROM, Strengthening, Both, 10 reps    General Comments        Pertinent Vitals/Pain Pain Assessment Pain Assessment: 0-10 Pain Score: 6  Pain Location: back, knees, elbows Pain Descriptors / Indicators:  ("arthritis") Pain Intervention(s): Limited activity within patient's tolerance, Monitored during session    Home Living                          Prior Function            PT Goals (current goals can now be found in the care plan section) Acute Rehab PT Goals Patient Stated Goal: to feel better PT Goal Formulation: With patient Time For Goal Achievement: 01/30/23 Potential to Achieve Goals: Good Progress towards PT goals: Progressing toward goals    Frequency    Min 1X/week      PT Plan      Co-evaluation  AM-PAC PT "6 Clicks" Mobility   Outcome Measure  Help needed turning from your back to your side while in a flat bed without using bedrails?: A Little Help needed moving from lying on your back to sitting on the side of a flat bed without using bedrails?: A Little Help needed moving to and from a bed to a chair (including a wheelchair)?: A Little Help needed standing up from a chair using your arms (e.g., wheelchair or bedside chair)?: A Little Help needed to walk in hospital room?: A Little Help needed climbing 3-5 steps with a railing? : A Lot 6 Click Score: 17    End of Session Equipment Utilized During Treatment: Oxygen Activity Tolerance: Patient limited by fatigue Patient left: in chair;with call bell/phone within reach;with chair alarm set Nurse Communication: Mobility status PT Visit Diagnosis: Muscle weakness (generalized) (M62.81);Difficulty in walking, not elsewhere classified (R26.2)     Time: 6578-4696 PT Time Calculation (min) (ACUTE ONLY): 13 min  Charges:    $Therapeutic Exercise: 8-22 mins PT General Charges $$ ACUTE PT VISIT: 1 Visit                      Tori Sapir Lavey PT, DPT 01/19/23, 11:05 AM

## 2023-01-19 NOTE — Care Management Important Message (Signed)
Important Message  Patient Details IM Letter given. Name: Wendy Torres MRN: [id number] Date of Birth: November 05, 1940   Medicare Important Message Given:  Yes     Caren Macadam 01/19/2023, 1:11 PM

## 2023-01-21 ENCOUNTER — Encounter (HOSPITAL_COMMUNITY): Payer: Self-pay | Admitting: Gastroenterology

## 2023-01-21 DIAGNOSIS — D539 Nutritional anemia, unspecified: Secondary | ICD-10-CM | POA: Diagnosis not present

## 2023-01-21 DIAGNOSIS — Z8673 Personal history of transient ischemic attack (TIA), and cerebral infarction without residual deficits: Secondary | ICD-10-CM | POA: Diagnosis not present

## 2023-01-21 DIAGNOSIS — K921 Melena: Secondary | ICD-10-CM | POA: Diagnosis not present

## 2023-01-21 DIAGNOSIS — R911 Solitary pulmonary nodule: Secondary | ICD-10-CM | POA: Diagnosis not present

## 2023-01-21 DIAGNOSIS — J441 Chronic obstructive pulmonary disease with (acute) exacerbation: Secondary | ICD-10-CM | POA: Diagnosis not present

## 2023-01-21 DIAGNOSIS — G4700 Insomnia, unspecified: Secondary | ICD-10-CM | POA: Diagnosis not present

## 2023-01-21 DIAGNOSIS — I272 Pulmonary hypertension, unspecified: Secondary | ICD-10-CM | POA: Diagnosis not present

## 2023-01-21 DIAGNOSIS — Z87891 Personal history of nicotine dependence: Secondary | ICD-10-CM | POA: Diagnosis not present

## 2023-01-21 DIAGNOSIS — Q2733 Arteriovenous malformation of digestive system vessel: Secondary | ICD-10-CM | POA: Diagnosis not present

## 2023-01-21 DIAGNOSIS — M810 Age-related osteoporosis without current pathological fracture: Secondary | ICD-10-CM | POA: Diagnosis not present

## 2023-01-21 DIAGNOSIS — J9621 Acute and chronic respiratory failure with hypoxia: Secondary | ICD-10-CM | POA: Diagnosis not present

## 2023-01-21 DIAGNOSIS — K219 Gastro-esophageal reflux disease without esophagitis: Secondary | ICD-10-CM | POA: Diagnosis not present

## 2023-01-21 DIAGNOSIS — R350 Frequency of micturition: Secondary | ICD-10-CM | POA: Diagnosis not present

## 2023-01-21 DIAGNOSIS — I110 Hypertensive heart disease with heart failure: Secondary | ICD-10-CM | POA: Diagnosis not present

## 2023-01-21 DIAGNOSIS — E785 Hyperlipidemia, unspecified: Secondary | ICD-10-CM | POA: Diagnosis not present

## 2023-01-21 DIAGNOSIS — G894 Chronic pain syndrome: Secondary | ICD-10-CM | POA: Diagnosis not present

## 2023-01-21 DIAGNOSIS — M179 Osteoarthritis of knee, unspecified: Secondary | ICD-10-CM | POA: Diagnosis not present

## 2023-01-21 DIAGNOSIS — I5032 Chronic diastolic (congestive) heart failure: Secondary | ICD-10-CM | POA: Diagnosis not present

## 2023-01-21 DIAGNOSIS — K449 Diaphragmatic hernia without obstruction or gangrene: Secondary | ICD-10-CM | POA: Diagnosis not present

## 2023-01-21 DIAGNOSIS — E876 Hypokalemia: Secondary | ICD-10-CM | POA: Diagnosis not present

## 2023-01-21 DIAGNOSIS — J4489 Other specified chronic obstructive pulmonary disease: Secondary | ICD-10-CM | POA: Diagnosis not present

## 2023-01-21 DIAGNOSIS — M5412 Radiculopathy, cervical region: Secondary | ICD-10-CM | POA: Diagnosis not present

## 2023-01-22 ENCOUNTER — Telehealth: Payer: Self-pay

## 2023-01-22 LAB — SURGICAL PATHOLOGY

## 2023-01-22 NOTE — Transitions of Care (Post Inpatient/ED Visit) (Signed)
01/22/2023  Name: Wendy Torres MRN: [id number] DOB: [birth year]  Today's TOC FU Call Status: Today's TOC FU Call Status:: Successful TOC FU Call Completed TOC FU Call Complete Date: 01/22/23 Patient's Name and Date of Birth confirmed.  Transition Care Management Follow-up Telephone Call Date of Discharge: 01/19/23 Discharge Facility: [facility]) Type of Discharge: Inpatient Admission Primary Inpatient Discharge Diagnosis:: COPD with acute exacerbation. How have you been since you were released from the hospital?: Better Any questions or concerns?: Yes Patient Questions/Concerns:: Patient and daughter is worried about the amount of medication that the patient is on and should she still continue to take the medication as often as she does. Patient Questions/Concerns Addressed: Notified Provider of Patient Questions/Concerns  Items Reviewed: Did you receive and understand the discharge instructions provided?: Yes Medications obtained,verified, and reconciled?: Yes (Medications Reviewed) Any new allergies since your discharge?: No Dietary orders reviewed?: NA Do you have support at home?: Yes People in Home: child(ren), adult  Medications Reviewed Today: Medications Reviewed Today   Medications were not reviewed in this encounter     Home Care and Equipment/Supplies: Were Home Health Services Ordered?: Yes Name of Home Health Agency:: PT coming to see her, Unsure about the name of the agency. Has Agency set up a time to come to your home?: Yes First Home Health Visit Date: 01/21/23 Any new equipment or medical supplies ordered?: No  Functional Questionnaire: Do you need assistance with bathing/showering or dressing?: Yes Do you need assistance with meal preparation?: Yes Do you need assistance with eating?: No Do you have difficulty maintaining continence: No Do you need assistance with getting out of bed/getting out of a chair/moving?: Yes Do you have difficulty  managing or taking your medications?: No  Follow up appointments reviewed: PCP Follow-up appointment confirmed?: Yes Date of PCP follow-up appointment?: 01/25/23 Follow-up Provider: Dr. Laray Anger Follow-up appointment confirmed?: NA Do you need transportation to your follow-up appointment?: No Do you understand care options if your condition(s) worsen?: Yes-patient verbalized understanding    SIGNATURE: Reather Laurence, RMA

## 2023-01-23 ENCOUNTER — Telehealth: Payer: Self-pay | Admitting: Internal Medicine

## 2023-01-23 NOTE — Telephone Encounter (Signed)
HH PT  1 week 9 Maria called from [place]  [phone]

## 2023-01-23 NOTE — Telephone Encounter (Signed)
HH fot PT    2 week 4 1 week 4   Wendy Torres with [facility] [phone]   Disregard verbal orders at 12:06

## 2023-01-23 NOTE — Telephone Encounter (Signed)
Ok for new verbals

## 2023-01-23 NOTE — Discharge Summary (Signed)
Physician Discharge Summary   Patient: Wendy Torres MRN: [id number] DOB: [birth year]  Admit date:     01/14/2023  Discharge date: 01/19/2023  Discharge Physician: Lynden Oxford  PCP: Corwin Levins, MD  Recommendations at discharge: Follow-up with PCP in 1 week. Will require follow-up CT chest for 1.4 cm size right lower lobe nodule. Follow-up with GI.   Follow-up Information     Corwin Levins, MD. Schedule an appointment as soon as possible for a visit in 1 week(s).   Specialties: Internal Medicine, Radiology Why: with CBC lab to look at blood counts, with BMP lab to look at kidney and electrolytes Contact information: [address] [place] [address] [phone]         Meryl Dare, MD. Call.   Specialty: Gastroenterology Why: As needed Contact information: [address] [place] [address] [phone]                Discharge Diagnoses: Principal Problem:   COPD with acute exacerbation [facility]) Active Problems:   Essential hypertension   Hypokalemia   Acute on chronic respiratory failure with hypoxia (HCC)   Incidental pulmonary nodule   Chronic heart failure with preserved ejection fraction (HFpEF) (HCC)   Normocytic anemia   Occult blood in stools   AVM (arteriovenous malformation) of small bowel, acquired  Hospital Course: Wendy Torres is a 82 y.o. female with a history of hypertension, COPD, chronic respiratory failure on 4 L/min of oxygen, pulmonary hypertension, chronic diastolic heart failure, anxiety, chronic pain syndrome, GERD, hyperlipidemia, stroke/TIA.  Patient presented secondary to shortness of breath and found to have evidence of a COPD exacerbation. This was treated with breathing treatments and steroids with quick improvement. During hospitalization, patient was noted to have black stool concerning for melena. FOBT ordered and GI consulted. Underwent EGD which was negative for any active bleeding.  COPD  exacerbation. Chest x-ray clear. Oxygenation improving to close to baseline. No wheezing at the time of my evaluation. Continue steroids and nebulizer therapy.   Chronic respiratory failure. On 4 LPM currently on 4 LPM. Monitor.   Pulmonary nodule. Will require follow-up CT chest for 1.4 cm size right lower lobe nodule.   Melena. Underwent EGD. 2 small nonbleeding duodenal ulcers seen as well as erythematous mucosa. Also for nonbleeding angiectasia seen. Treated with APC. Per my Discussion with GI okay to resume aspirin.   HTN. Blood pressure stable Continue.   Hypokalemia Monitor.   Pulmonary hypertension. Seen on the echocardiogram. Monitor.   Anxiety better Xanax.  Continue.   Chronic pain syndrome. On tramadol twice daily as needed 100 mg.  Likely resume.   GERD. Continue PPI.   HLD. On pravastatin.   Possible esophageal candidiasis. Will initiate Diflucan therapy.  Deconditioning. PT OT recommended SNF although patient wanted to go home. Home health arranged.  Consultants:  GI  Procedures performed:  EGD  DISCHARGE MEDICATION: Allergies as of 01/19/2023       Reactions   Chlorthalidone Shortness Of Breath        Medication List     TAKE these medications    acetaminophen 325 MG tablet Commonly known as: TYLENOL Take 2 tablets (650 mg total) by mouth every 6 (six) hours as needed for mild pain (or Fever >/= 101). What changed: reasons to take this   albuterol 108 (90 Base) MCG/ACT inhaler Commonly known as: VENTOLIN HFA INHALE 2 PUFFS INTO THE LUNGS EVERY 4 HOURS AS NEEDED FOR WHEEZING OR SHORTNESS  OF BREATH. What changed: when to take this   ALPRAZolam 0.25 MG tablet Commonly known as: XANAX TAKE 1 TABLET BY MOUTH TWICE A DAY AS NEEDED What changed:  when to take this reasons to take this   amLODipine 10 MG tablet Commonly known as: NORVASC Take 1 tablet (10 mg total) by mouth daily.   aspirin EC 81 MG tablet Take 1  tablet (81 mg total) by mouth daily. Swallow whole.   cloNIDine 0.1 MG tablet Commonly known as: CATAPRES TAKE 1 TABLET BY MOUTH TWICE A DAY   fluconazole 200 MG tablet Commonly known as: DIFLUCAN Take 1 tablet (200 mg total) by mouth daily for 12 days.   fluocinolone 0.01 % external solution Commonly known as: SYNALAR APPLY TOPICALLY 2 TIMES DAILY AS NEEDED.   furosemide 20 MG tablet Commonly known as: LASIX TAKE 1 TABLET BY MOUTH IN THE  MORNING   gabapentin 300 MG capsule Commonly known as: NEURONTIN TAKE 2 CAPSULE BY MOUTH 3 TIMES  DAILY   guaiFENesin 600 MG 12 hr tablet Commonly known as: MUCINEX Take 1 tablet (600 mg total) by mouth 2 (two) times daily.   hydrALAZINE 100 MG tablet Commonly known as: APRESOLINE Take 1 tablet (100 mg total) by mouth 3 (three) times daily.   iron polysaccharides 150 MG capsule Commonly known as: Nu-Iron Take 1 capsule (150 mg total) by mouth daily.   losartan 100 MG tablet Commonly known as: COZAAR Take 1 tablet (100 mg total) by mouth daily.   lovastatin 40 MG tablet Commonly known as: MEVACOR Take 1 tablet (40 mg total) by mouth at bedtime. Start taking on: February 02, 2023 What changed: These instructions start on February 02, 2023. If you are unsure what to do until then, ask your doctor or other care provider.   meclizine 12.5 MG tablet Commonly known as: ANTIVERT TAKE 1 TABLET BY MOUTH 3 TIMES A DAY AS NEEDED FOR DIZZINESS What changed: See the new instructions.   MUCINEX ALLERGY PO Take 1 tablet by mouth as needed (congestion).   nystatin 100000 UNIT/ML suspension Commonly known as: MYCOSTATIN Swish and spit 5mL two times daily after using Symbicort. What changed:  how much to take how to take this when to take this additional instructions   pantoprazole 40 MG tablet Commonly known as: PROTONIX TAKE 1 TABLET BY MOUTH DAILY What changed: when to take this   polyethylene glycol 17 g packet Commonly known as:  MIRALAX / GLYCOLAX Take 17 g by mouth daily as needed for mild constipation or moderate constipation.   predniSONE 10 MG tablet Commonly known as: DELTASONE Take 20mg  daily for 3days,Take 10mg  daily for 3days, then stop   QC TUMERIC COMPLEX PO Take 1,000 mg by mouth daily.   solifenacin 10 MG tablet Commonly known as: VESICARE Take 1 tablet (10 mg total) by mouth daily.   tiZANidine 4 MG tablet Commonly known as: ZANAFLEX TAKE 1 TABLET BY MOUTH EVERY 6 HOURS AS NEEDED FOR MUSCLE SPASMS.   traMADol 50 MG tablet Commonly known as: ULTRAM Take 1 tablet (50 mg total) by mouth every 6 (six) hours as needed. for pain What changed:  when to take this additional instructions   traZODone 50 MG tablet Commonly known as: DESYREL TAKE 1/2 TO 1 TABLET BY MOUTH AT BEDTIME AS NEEDED FOR SLEEP What changed:  reasons to take this additional instructions   Trelegy Ellipta 200-62.5-25 MCG/ACT Aepb Generic drug: Fluticasone-Umeclidin-Vilant Inhale 1 puff into the lungs daily.   triamcinolone  cream 0.1 % Commonly known as: KENALOG APPLY TO AFFECTED AREA TWICE A DAY What changed: See the new instructions.       ASK your doctor about these medications    doxycycline 100 MG tablet Commonly known as: VIBRA-TABS Take 1 tablet (100 mg total) by mouth every 12 (twelve) hours for 1 day. Ask about: Should I take this medication?       Disposition: Home Diet recommendation: Cardiac diet  Discharge Exam: Vitals:   01/19/23 0606 01/19/23 0812 01/19/23 1036 01/19/23 1308  BP: (!) 140/68  (!) 147/63 (!) 146/73  Pulse: 60   65  Resp: 18   16  Temp:    98.3 F (36.8 C)  TempSrc:    Oral  SpO2: 100% 93%  98%  Weight:      Height:       General: Appear in mild distress; no visible Abnormal Neck Mass Or lumps, Conjunctiva normal Cardiovascular: S1 and S2 Present, no Murmur, Respiratory: good respiratory effort, Bilateral Air entry present and CTA, no Crackles, no wheezes Abdomen:  Bowel Sound present, Non tender  Extremities: trace Pedal edema Neurology: alert and oriented to time, place, and person  [facility] Weights   01/14/23 2335 01/18/23 1347  Weight: 41.7 kg 90.7 kg   Condition at discharge: stable  The results of significant diagnostics from this hospitalization (including imaging, microbiology, ancillary and laboratory) are listed below for reference.   Imaging Studies: DG Abd Portable 1V  Result Date: 01/17/2023 CLINICAL DATA:  Constipation. EXAM: PORTABLE ABDOMEN - 1 VIEW COMPARISON:  None Available. FINDINGS: No bowel dilatation or evidence of obstruction. Small volume of formed stool in the colon. No evidence of free air on the supine view. No definite radiopaque calculi. Bilateral hip arthroplasties. IMPRESSION: Normal bowel gas pattern.  Small volume of stool in the colon. Electronically Signed   By: Narda Rutherford M.D.   On: 01/17/2023 16:27   CT Angio Chest PE W/Cm &/Or Wo Cm  Result Date: 01/15/2023 CLINICAL DATA:  Shortness of breath progressively worsening. PE suspected. EXAM: CT ANGIOGRAPHY CHEST WITH CONTRAST TECHNIQUE: Multidetector CT imaging of the chest was performed using the standard protocol during bolus administration of intravenous contrast. Multiplanar CT image reconstructions and MIPs were obtained to evaluate the vascular anatomy. RADIATION DOSE REDUCTION: This exam was performed according to the departmental dose-optimization program which includes automated exposure control, adjustment of the mA and/or kV according to patient size and/or use of iterative reconstruction technique. CONTRAST:  75mL OMNIPAQUE IOHEXOL 350 MG/ML SOLN COMPARISON:  Chest radiograph 01/15/2023 and CTA chest 10/17/2008 FINDINGS: Cardiovascular: Negative for acute pulmonary embolism. No pericardial effusion. Coronary artery and aortic atherosclerotic calcification. No aortic aneurysm or dissection. Mediastinum/Nodes: Bowing of the trachea compatible with aspiration.  Unremarkable esophagus. Enlarged mediastinal and hilar lymph nodes. For example a subcarinal node measuring 1.5 cm on series 4/image 59 and a AP window node measuring 1.6 cm on 4/48. Lungs/Pleura: Emphysema. Bibasilar atelectasis/scarring with associated bronchiolectasis. 1.4 cm subpleural nodule along the posteromedial right lower lobe may be due to rounded atelectasis however mass is not excluded (series 12/image 72). Upper Abdomen: No acute abnormality. Musculoskeletal: No acute fracture. Right subscapularis intramuscular lipoma. Review of the MIP images confirms the above findings. IMPRESSION: 1. Negative for acute pulmonary embolism. 2. 1.4 cm subpleural nodule along the posteromedial right lower lobe may be due to rounded atelectasis however mass is not excluded. Consider one of the following in 3 months for both low-risk and high-risk individuals: (a) repeat  chest CT, (b) follow-up PET-CT, or (c) tissue sampling. This recommendation follows the consensus statement: Guidelines for Management of Incidental Pulmonary Nodules Detected on CT Images: From the Fleischner Society 2017; Radiology 2017; 284:228-243. 3. Enlarged mediastinal and hilar lymph nodes. Continued attention on follow-up. 4. Aortic Atherosclerosis (ICD10-I70.0) and Emphysema (ICD10-J43.9). Electronically Signed   By: Minerva Fester M.D.   On: 01/15/2023 02:00   DG Chest Port 1 View  Result Date: 01/15/2023 CLINICAL DATA:  Shortness of breath EXAM: PORTABLE CHEST 1 VIEW COMPARISON:  06/12/2021 FINDINGS: Stable cardiomegaly. Aortic atherosclerotic calcification. Tortuous aorta. Atelectasis or infiltrates in the lower lungs. Trace bilateral pleural effusions or pleural thickening. No pneumothorax. IMPRESSION: Atelectasis or infiltrates in the lower lungs. Electronically Signed   By: Minerva Fester M.D.   On: 01/15/2023 00:27    Microbiology: Results for orders placed or performed during the hospital encounter of 01/14/23  SARS Coronavirus  2 by RT PCR (hospital order, performed in [facility] hospital lab) *cepheid single result test* Anterior Nasal Swab     Status: None   Collection Time: 01/15/23 12:15 AM   Specimen: Anterior Nasal Swab  Result Value Ref Range Status   SARS Coronavirus 2 by RT PCR NEGATIVE NEGATIVE Final    Comment: (NOTE) SARS-CoV-2 target nucleic acids are NOT DETECTED.  The SARS-CoV-2 RNA is generally detectable in upper and lower respiratory specimens during the acute phase of infection. The lowest concentration of SARS-CoV-2 viral copies this assay can detect is 250 copies / mL. A negative result does not preclude SARS-CoV-2 infection and should not be used as the sole basis for treatment or other patient management decisions.  A negative result may occur with improper specimen collection / handling, submission of specimen other than nasopharyngeal swab, presence of viral mutation(s) within the areas targeted by this assay, and inadequate number of viral copies (<250 copies / mL). A negative result must be combined with clinical observations, patient history, and epidemiological information.  Fact Sheet for Patients:   RoadLapTop.co.za  Fact Sheet for Healthcare Providers: http://kim-miller.com/  This test is not yet approved or  cleared by the [place] FDA and has been authorized for detection and/or diagnosis of SARS-CoV-2 by FDA under an Emergency Use Authorization (EUA).  This EUA will remain in effect (meaning this test can be used) for the duration of the COVID-19 declaration under Section 564(b)(1) of the Act, 21 U.S.C. section 360bbb-3(b)(1), unless the authorization is terminated or revoked sooner.  Performed at [facility], [address] [place], [place] [address]    Labs: CBC: Recent Labs  Lab 01/17/23 0527 01/18/23 0536  WBC 10.6* 11.0*  HGB 10.2* 10.4*  HCT 32.9* 33.7*  MCV 82.7 83.6  PLT 357 366    Basic Metabolic Panel: Recent Labs  Lab 01/17/23 0527 01/18/23 0536  NA 134* 135  K 3.4* 3.2*  CL 101 101  CO2 24 25  GLUCOSE 102* 98  BUN 19 20  CREATININE 1.16* 0.99  CALCIUM 9.7 9.6   Liver Function Tests: Recent Labs  Lab 01/18/23 0536  AST 24  ALT 20  ALKPHOS 60  BILITOT 0.6  PROT 7.6  ALBUMIN 3.5   CBG: No results for input(s): "GLUCAP" in the last 168 hours.  Discharge time spent: greater than 30 minutes.  Author: Lynden Oxford, MD  [facility] 01/19/2023

## 2023-01-24 NOTE — Telephone Encounter (Signed)
Called and left voice mail

## 2023-01-25 ENCOUNTER — Encounter: Payer: Self-pay | Admitting: Internal Medicine

## 2023-01-25 ENCOUNTER — Ambulatory Visit: Payer: Medicare Other | Admitting: Internal Medicine

## 2023-01-25 ENCOUNTER — Encounter: Payer: Self-pay | Admitting: Gastroenterology

## 2023-01-25 DIAGNOSIS — Z87891 Personal history of nicotine dependence: Secondary | ICD-10-CM | POA: Diagnosis not present

## 2023-01-25 DIAGNOSIS — D539 Nutritional anemia, unspecified: Secondary | ICD-10-CM | POA: Diagnosis not present

## 2023-01-25 DIAGNOSIS — R911 Solitary pulmonary nodule: Secondary | ICD-10-CM | POA: Diagnosis not present

## 2023-01-25 DIAGNOSIS — M810 Age-related osteoporosis without current pathological fracture: Secondary | ICD-10-CM | POA: Diagnosis not present

## 2023-01-25 DIAGNOSIS — J449 Chronic obstructive pulmonary disease, unspecified: Secondary | ICD-10-CM | POA: Diagnosis not present

## 2023-01-25 DIAGNOSIS — K449 Diaphragmatic hernia without obstruction or gangrene: Secondary | ICD-10-CM | POA: Diagnosis not present

## 2023-01-25 DIAGNOSIS — I110 Hypertensive heart disease with heart failure: Secondary | ICD-10-CM | POA: Diagnosis not present

## 2023-01-25 DIAGNOSIS — M179 Osteoarthritis of knee, unspecified: Secondary | ICD-10-CM | POA: Diagnosis not present

## 2023-01-25 DIAGNOSIS — Q2733 Arteriovenous malformation of digestive system vessel: Secondary | ICD-10-CM | POA: Diagnosis not present

## 2023-01-25 DIAGNOSIS — G894 Chronic pain syndrome: Secondary | ICD-10-CM | POA: Diagnosis not present

## 2023-01-25 DIAGNOSIS — E785 Hyperlipidemia, unspecified: Secondary | ICD-10-CM | POA: Diagnosis not present

## 2023-01-25 DIAGNOSIS — M5412 Radiculopathy, cervical region: Secondary | ICD-10-CM | POA: Diagnosis not present

## 2023-01-25 DIAGNOSIS — K219 Gastro-esophageal reflux disease without esophagitis: Secondary | ICD-10-CM | POA: Diagnosis not present

## 2023-01-25 DIAGNOSIS — D649 Anemia, unspecified: Secondary | ICD-10-CM

## 2023-01-25 DIAGNOSIS — J9621 Acute and chronic respiratory failure with hypoxia: Secondary | ICD-10-CM | POA: Diagnosis not present

## 2023-01-25 DIAGNOSIS — I5032 Chronic diastolic (congestive) heart failure: Secondary | ICD-10-CM | POA: Diagnosis not present

## 2023-01-25 DIAGNOSIS — K921 Melena: Secondary | ICD-10-CM | POA: Diagnosis not present

## 2023-01-25 DIAGNOSIS — G4700 Insomnia, unspecified: Secondary | ICD-10-CM | POA: Diagnosis not present

## 2023-01-25 DIAGNOSIS — J4489 Other specified chronic obstructive pulmonary disease: Secondary | ICD-10-CM | POA: Diagnosis not present

## 2023-01-25 DIAGNOSIS — R350 Frequency of micturition: Secondary | ICD-10-CM | POA: Diagnosis not present

## 2023-01-25 DIAGNOSIS — E876 Hypokalemia: Secondary | ICD-10-CM | POA: Diagnosis not present

## 2023-01-25 DIAGNOSIS — Z8673 Personal history of transient ischemic attack (TIA), and cerebral infarction without residual deficits: Secondary | ICD-10-CM | POA: Diagnosis not present

## 2023-01-25 DIAGNOSIS — J441 Chronic obstructive pulmonary disease with (acute) exacerbation: Secondary | ICD-10-CM | POA: Diagnosis not present

## 2023-01-25 DIAGNOSIS — I272 Pulmonary hypertension, unspecified: Secondary | ICD-10-CM | POA: Diagnosis not present

## 2023-01-25 MED ORDER — GABAPENTIN 600 MG PO TABS: 600.0000 mg | ORAL_TABLET | Freq: Three times a day (TID) | ORAL | 1 refills | Status: DC

## 2023-01-25 NOTE — Patient Instructions (Signed)
Please continue all other medications as before, and refills have been done if requested - the gabapentin  Please have the pharmacy call with any other refills you may need.  Please continue your efforts at being more active, low cholesterol diet, and weight control.  Please keep your appointments with your specialists as you may have planned - Pulmonary next month  Please make an Appointment to return in 3 months, or sooner if needed

## 2023-01-25 NOTE — Progress Notes (Signed)
Patient ID: Wendy Torres, female   DOB: [birth year], 82 y.o.   MRN: [id number]        Chief Complaint: follow up recent hospn 9/15 - 20 2024 with copd exacerbation, respiratory failure acute on chronic, chf, pulm nodule        HPI:  Wendy Torres is a 82 y.o. female here overall doing much better.  Pt denies chest pain, increased sob or doe, wheezing, orthopnea, PND, increased LE swelling, palpitations, dizziness or syncope.  Has had no worsening fever, chills, cough, wheezing sob or chest pain post d/c.  Function essentially back to baseline, and has family help with her today.  Remains on 4L home o2.  Has pulm f/u oct 2024.  No new complaints  Pt informed of 1.4 cm RLL nodule and will need f/u CT chest at 3 mo.  Denies worsening reflux, abd pain, dysphagia, n/v, bowel change or blood after resuming ASA after APC for small bowel AVM.  Also needs refill gabapentin 600 tid       Transitional Care Management elements noted today: 1)  Date of D/C: as above 2)  Medication reconciliation:  done today at end visit 3)  Review of D/C summary or other information:  done today 4)  Review of need for f/u on pending diagnostic tests and treatments:  done today - CT as above 5)  Review of need for Interaction with other providers who will assume or resume care of pt specific problems: done today as above 6)  Education of patient/family/guardian or caregiver: done today - family with he  Wt Readings from Last 3 Encounters:  01/18/23 200 lb (90.7 kg)  12/25/22 206 lb (93.4 kg)  12/19/22 200 lb (90.7 kg)   BP Readings from Last 3 Encounters:  01/25/23 138/76  01/19/23 (!) 146/73  12/25/22 (!) 150/60         Past Medical History:  Diagnosis Date   ABNORMAL CHEST XRAY 11/05/2008   ACUTE URIS OF UNSPECIFIED SITE 10/22/2007   ALLERGIC RHINITIS 12/09/2006   ANEMIA 01/14/2009   Anxiety    Arthritis    ASTHMA 12/09/2006   Asthma    Cataract    bilateral -removed   CHF (congestive heart failure) (HCC)     Chronic pain syndrome 02/16/2009   COPD 12/09/2006   DEGENERATIVE JOINT DISEASE 12/09/2006   DIZZINESS 03/30/2010   DYSPNEA 01/27/2010   with heavy exertion   FREQUENCY, URINARY 03/30/2010   GERD 12/09/2006   Glossitis 01/14/2009   HELICOBACTER PYLORI GASTRITIS, HX OF 12/09/2006   HIATAL HERNIA 03/01/2010   History of hiatal hernia    HYPERLIPIDEMIA 04/18/2007   HYPERSOMNIA 08/31/2008   HYPERTENSION 12/09/2006   INSOMNIA-SLEEP DISORDER-UNSPEC 11/05/2008   LOW BACK PAIN 12/09/2006   Morbid obesity (HCC) 12/09/2006   Nonspecific (abnormal) findings on radiological and other examination of body structure 11/05/2008   OSTEOPOROSIS 12/09/2006   OVERACTIVE BLADDER 04/14/2008   PALPITATIONS, RECURRENT 01/27/2010   Stroke (HCC)    pt. stated light one years ago   SYNCOPE 11/05/2008   TRANSIENT ISCHEMIC ATTACK, HX OF 12/09/2006   Past Surgical History:  Procedure Laterality Date   BIOPSY  01/18/2023   Procedure: BIOPSY;  Surgeon: Meryl Dare, MD;  Location: [facility];  Service: Gastroenterology;;   COLONOSCOPY     ESOPHAGOGASTRODUODENOSCOPY (EGD) WITH PROPOFOL N/A 01/18/2023   Procedure: ESOPHAGOGASTRODUODENOSCOPY (EGD) WITH PROPOFOL;  Surgeon: Meryl Dare, MD;  Location: [facility];  Service: Gastroenterology;  Laterality: N/A;   HOT  HEMOSTASIS N/A 01/18/2023   Procedure: HOT HEMOSTASIS (ARGON PLASMA COAGULATION/BICAP);  Surgeon: Meryl Dare, MD;  Location: [facility];  Service: Gastroenterology;  Laterality: N/A;   LUMBAR DISC SURGERY     TONSILLECTOMY     as a child   TOTAL HIP ARTHROPLASTY Right 11/06/2012   Procedure: RIGHT TOTAL HIP ARTHROPLASTY WITH ACETABULAR AUTOGRAFT;  Surgeon: Loanne Drilling, MD;  Location: [facility];  Service: Orthopedics;  Laterality: Right;   TOTAL HIP ARTHROPLASTY Left 02/18/2014   Procedure: LEFT TOTAL HIP ARTHROPLASTY;  Surgeon: Loanne Drilling, MD;  Location: [facility];  Service: Orthopedics;  Laterality: Left;   TUBAL LIGATION       reports that she quit smoking about 50 years ago. Her smoking use included cigarettes. She started smoking about 75 years ago. She has a 12.5 pack-year smoking history. She has never used smokeless tobacco. She reports that she does not drink alcohol and does not use drugs. family history includes Heart disease in her mother; Hypertension in her brother; Lung cancer in her father. Allergies  Allergen Reactions   Chlorthalidone Shortness Of Breath   Current Outpatient Medications on File Prior to Visit  Medication Sig Dispense Refill   acetaminophen (TYLENOL) 325 MG tablet Take 2 tablets (650 mg total) by mouth every 6 (six) hours as needed for mild pain (or Fever >/= 101). (Patient taking differently: Take 650 mg by mouth every 6 (six) hours as needed for mild pain.) 40 tablet 0   albuterol (VENTOLIN HFA) 108 (90 Base) MCG/ACT inhaler INHALE 2 PUFFS INTO THE LUNGS EVERY 4 HOURS AS NEEDED FOR WHEEZING OR SHORTNESS OF BREATH. (Patient taking differently: Inhale 2 puffs into the lungs as needed for wheezing or shortness of breath.) 8.5 each 1   ALPRAZolam (XANAX) 0.25 MG tablet TAKE 1 TABLET BY MOUTH TWICE A DAY AS NEEDED (Patient taking differently: Take 0.25 mg by mouth as needed for anxiety.) 60 tablet 2   amLODipine (NORVASC) 10 MG tablet Take 1 tablet (10 mg total) by mouth daily. 90 tablet 3   aspirin EC 81 MG tablet Take 1 tablet (81 mg total) by mouth daily. Swallow whole.     cloNIDine (CATAPRES) 0.1 MG tablet TAKE 1 TABLET BY MOUTH TWICE A DAY 180 tablet 3   Fexofenadine HCl (MUCINEX ALLERGY PO) Take 1 tablet by mouth as needed (congestion).     fluconazole (DIFLUCAN) 200 MG tablet Take 1 tablet (200 mg total) by mouth daily for 12 days. 12 tablet 0   fluocinolone (SYNALAR) 0.01 % external solution APPLY TOPICALLY 2 TIMES DAILY AS NEEDED. 60 mL 0   Fluticasone-Umeclidin-Vilant (TRELEGY ELLIPTA) 200-62.5-25 MCG/ACT AEPB Inhale 1 puff into the lungs daily. 60 each 2   furosemide (LASIX)  20 MG tablet TAKE 1 TABLET BY MOUTH IN THE  MORNING 100 tablet 2   guaiFENesin (MUCINEX) 600 MG 12 hr tablet Take 1 tablet (600 mg total) by mouth 2 (two) times daily. 30 tablet 0   hydrALAZINE (APRESOLINE) 100 MG tablet Take 1 tablet (100 mg total) by mouth 3 (three) times daily. 270 tablet 3   iron polysaccharides (NU-IRON) 150 MG capsule Take 1 capsule (150 mg total) by mouth daily. 90 capsule 1   losartan (COZAAR) 100 MG tablet Take 1 tablet (100 mg total) by mouth daily. 90 tablet 3   [START ON 02/02/2023] lovastatin (MEVACOR) 40 MG tablet Take 1 tablet (40 mg total) by mouth at bedtime.     meclizine (ANTIVERT) 12.5 MG tablet TAKE  1 TABLET BY MOUTH 3 TIMES A DAY AS NEEDED FOR DIZZINESS (Patient taking differently: Take 12.5 mg by mouth 3 (three) times daily as needed for dizziness or nausea.) 30 tablet 2   nystatin (MYCOSTATIN) 100000 UNIT/ML suspension Swish and spit 5mL two times daily after using Symbicort. (Patient taking differently: Use as directed 5 mLs in the mouth or throat in the morning and at bedtime. Swish and spit) 473 mL 5   pantoprazole (PROTONIX) 40 MG tablet TAKE 1 TABLET BY MOUTH DAILY (Patient taking differently: Take 40 mg by mouth 2 (two) times daily.) 100 tablet 2   polyethylene glycol (MIRALAX / GLYCOLAX) 17 g packet Take 17 g by mouth daily as needed for mild constipation or moderate constipation.     predniSONE (DELTASONE) 10 MG tablet Take 20mg  daily for 3days,Take 10mg  daily for 3days, then stop 10 tablet 0   solifenacin (VESICARE) 10 MG tablet Take 1 tablet (10 mg total) by mouth daily. 90 tablet 1   tiZANidine (ZANAFLEX) 4 MG tablet TAKE 1 TABLET BY MOUTH EVERY 6 HOURS AS NEEDED FOR MUSCLE SPASMS. (Patient taking differently: Take 4 mg by mouth every 6 (six) hours as needed for muscle spasms.) 50 tablet 1   traMADol (ULTRAM) 50 MG tablet Take 1 tablet (50 mg total) by mouth every 6 (six) hours as needed. for pain (Patient taking differently: Take 50 mg by mouth in the  morning and at bedtime.) 120 tablet 2   traZODone (DESYREL) 50 MG tablet TAKE 1/2 TO 1 TABLET BY MOUTH AT BEDTIME AS NEEDED FOR SLEEP (Patient taking differently: Take 25-50 mg by mouth at bedtime as needed for sleep.) 90 tablet 1   triamcinolone cream (KENALOG) 0.1 % APPLY TO AFFECTED AREA TWICE A DAY (Patient taking differently: Apply 1 Application topically as needed (eczema).) 30 g 1   Turmeric (QC TUMERIC COMPLEX PO) Take 1,000 mg by mouth daily.     No current facility-administered medications on file prior to visit.        ROS:  All others reviewed and negative.  Objective        PE:  BP 138/76 (BP Location: Left Arm, Patient Position: Sitting, Cuff Size: Normal)   Pulse 62   Temp 99.2 F (37.3 C) (Oral)   Ht 5\' 4"  (1.626 m)   SpO2 97%   BMI 34.33 kg/m                 Constitutional: Pt appears in NAD               HENT: Head: NCAT.                Right Ear: External ear normal.                 Left Ear: External ear normal.                Eyes: . Pupils are equal, round, and reactive to light. Conjunctivae and EOM are normal               Nose: without d/c or deformity               Neck: Neck supple. Gross normal ROM               Cardiovascular: Normal rate and regular rhythm.                 Pulmonary/Chest: Effort normal and breath sounds without rales or wheezing.  Abd:  Soft, NT, ND, + BS, no organomegaly               Neurological: Pt is alert. At baseline orientation, motor grossly intact               Skin: Skin is warm. No rashes, no other new lesions, LE edema - trace bilateral               Psychiatric: Pt behavior is normal without agitation   Micro: none  Cardiac tracings I have personally interpreted today:  none  Pertinent Radiological findings (summarize): none   Lab Results  Component Value Date   WBC 11.0 (H) 01/18/2023   HGB 10.4 (L) 01/18/2023   HCT 33.7 (L) 01/18/2023   PLT 366 01/18/2023   GLUCOSE 98 01/18/2023   CHOL 167  11/09/2022   TRIG 73.0 11/09/2022   HDL 63.10 11/09/2022   LDLCALC 89 11/09/2022   ALT 20 01/18/2023   AST 24 01/18/2023   NA 135 01/18/2023   K 3.2 (L) 01/18/2023   CL 101 01/18/2023   CREATININE 0.99 01/18/2023   BUN 20 01/18/2023   CO2 25 01/18/2023   TSH 0.85 11/09/2022   INR 1.02 02/10/2014   HGBA1C 5.7 11/09/2022   MICROALBUR 0.7 11/09/2022   Assessment/Plan:  Wendy Torres is a 82 y.o. Black or African American [2] female with  has a past medical history of ABNORMAL CHEST XRAY (11/05/2008), ACUTE URIS OF UNSPECIFIED SITE (10/22/2007), ALLERGIC RHINITIS (12/09/2006), ANEMIA (01/14/2009), Anxiety, Arthritis, ASTHMA (12/09/2006), Asthma, Cataract, CHF (congestive heart failure) (HCC), Chronic pain syndrome (02/16/2009), COPD (12/09/2006), DEGENERATIVE JOINT DISEASE (12/09/2006), DIZZINESS (03/30/2010), DYSPNEA (01/27/2010), FREQUENCY, URINARY (03/30/2010), GERD (12/09/2006), Glossitis (01/14/2009), HELICOBACTER PYLORI GASTRITIS, HX OF (12/09/2006), HIATAL HERNIA (03/01/2010), History of hiatal hernia, HYPERLIPIDEMIA (04/18/2007), HYPERSOMNIA (08/31/2008), HYPERTENSION (12/09/2006), INSOMNIA-SLEEP DISORDER-UNSPEC (11/05/2008), LOW BACK PAIN (12/09/2006), Morbid obesity (HCC) (12/09/2006), Nonspecific (abnormal) findings on radiological and other examination of body structure (11/05/2008), OSTEOPOROSIS (12/09/2006), OVERACTIVE BLADDER (04/14/2008), PALPITATIONS, RECURRENT (01/27/2010), Stroke (HCC), SYNCOPE (11/05/2008), and TRANSIENT ISCHEMIC ATTACK, HX OF (12/09/2006).  COPD with acute exacerbation (HCC) Clinically resolved, pt to continue inhaler asd  Chronic heart failure with preserved ejection fraction (HFpEF) (HCC) Volume stable, cont current med tx  Incidental pulmonary nodule 1.4 cm subpleural nodule along the posteromedial right lower lobe noted on CTA chest, will need Chest CT in 3 mo follow up  Normocytic anemia S/p EGD with APC of AVM - no further melena, declines f/u  lab today, cont asa she restarted  Followup: Return in about 3 months (around 04/26/2023).  Oliver Barre, MD 01/28/2023 10:40 AM [facility]

## 2023-01-28 ENCOUNTER — Encounter: Payer: Self-pay | Admitting: Internal Medicine

## 2023-01-28 NOTE — Assessment & Plan Note (Signed)
Clinically resolved, pt to continue inhaler asd

## 2023-01-28 NOTE — Assessment & Plan Note (Addendum)
S/p EGD with APC of AVM - no further melena, declines f/u lab today, cont asa she restarted

## 2023-01-28 NOTE — Assessment & Plan Note (Signed)
1.4 cm subpleural nodule along the posteromedial right lower lobe noted on CTA chest, will need Chest CT in 3 mo follow up

## 2023-01-28 NOTE — Assessment & Plan Note (Signed)
Volume stable, cont current med tx

## 2023-01-29 ENCOUNTER — Encounter (HOSPITAL_COMMUNITY): Payer: Self-pay | Admitting: *Deleted

## 2023-01-29 ENCOUNTER — Telehealth: Payer: Self-pay | Admitting: Pulmonary Disease

## 2023-01-29 ENCOUNTER — Emergency Department (HOSPITAL_COMMUNITY)
Admission: EM | Admit: 2023-01-29 | Discharge: 2023-01-30 | Disposition: A | Discharge status: Home / Self Care | Payer: Medicare Other | Attending: Emergency Medicine | Admitting: Emergency Medicine

## 2023-01-29 ENCOUNTER — Emergency Department (HOSPITAL_COMMUNITY): Payer: Medicare Other

## 2023-01-29 ENCOUNTER — Other Ambulatory Visit: Payer: Self-pay

## 2023-01-29 DIAGNOSIS — R0902 Hypoxemia: Secondary | ICD-10-CM | POA: Diagnosis not present

## 2023-01-29 DIAGNOSIS — R0600 Dyspnea, unspecified: Secondary | ICD-10-CM | POA: Diagnosis not present

## 2023-01-29 DIAGNOSIS — I10 Essential (primary) hypertension: Secondary | ICD-10-CM | POA: Diagnosis not present

## 2023-01-29 DIAGNOSIS — Z7982 Long term (current) use of aspirin: Secondary | ICD-10-CM | POA: Insufficient documentation

## 2023-01-29 DIAGNOSIS — I110 Hypertensive heart disease with heart failure: Secondary | ICD-10-CM | POA: Insufficient documentation

## 2023-01-29 DIAGNOSIS — R0602 Shortness of breath: Secondary | ICD-10-CM | POA: Insufficient documentation

## 2023-01-29 DIAGNOSIS — J441 Chronic obstructive pulmonary disease with (acute) exacerbation: Secondary | ICD-10-CM | POA: Diagnosis not present

## 2023-01-29 DIAGNOSIS — Z79899 Other long term (current) drug therapy: Secondary | ICD-10-CM | POA: Diagnosis not present

## 2023-01-29 DIAGNOSIS — R6889 Other general symptoms and signs: Secondary | ICD-10-CM | POA: Diagnosis not present

## 2023-01-29 DIAGNOSIS — Z7951 Long term (current) use of inhaled steroids: Secondary | ICD-10-CM | POA: Diagnosis not present

## 2023-01-29 DIAGNOSIS — R404 Transient alteration of awareness: Secondary | ICD-10-CM | POA: Diagnosis not present

## 2023-01-29 DIAGNOSIS — J449 Chronic obstructive pulmonary disease, unspecified: Secondary | ICD-10-CM | POA: Diagnosis not present

## 2023-01-29 DIAGNOSIS — R079 Chest pain, unspecified: Secondary | ICD-10-CM | POA: Diagnosis not present

## 2023-01-29 DIAGNOSIS — R531 Weakness: Secondary | ICD-10-CM | POA: Insufficient documentation

## 2023-01-29 DIAGNOSIS — I509 Heart failure, unspecified: Secondary | ICD-10-CM | POA: Diagnosis not present

## 2023-01-29 DIAGNOSIS — Z743 Need for continuous supervision: Secondary | ICD-10-CM | POA: Diagnosis not present

## 2023-01-29 DIAGNOSIS — Z1152 Encounter for screening for COVID-19: Secondary | ICD-10-CM | POA: Insufficient documentation

## 2023-01-29 DIAGNOSIS — R069 Unspecified abnormalities of breathing: Secondary | ICD-10-CM | POA: Diagnosis not present

## 2023-01-29 DIAGNOSIS — I517 Cardiomegaly: Secondary | ICD-10-CM | POA: Diagnosis not present

## 2023-01-29 LAB — CBC
HCT: 31 % — ABNORMAL LOW (ref 36.0–46.0)
Hemoglobin: 9.4 g/dL — ABNORMAL LOW (ref 12.0–15.0)
MCH: 25.5 pg — ABNORMAL LOW (ref 26.0–34.0)
MCHC: 30.3 g/dL (ref 30.0–36.0)
MCV: 84.2 fL (ref 80.0–100.0)
Platelets: 222 10*3/uL (ref 150–400)
RBC: 3.68 MIL/uL — ABNORMAL LOW (ref 3.87–5.11)
RDW: 16 % — ABNORMAL HIGH (ref 11.5–15.5)
WBC: 12.7 10*3/uL — ABNORMAL HIGH (ref 4.0–10.5)
nRBC: 0 % (ref 0.0–0.2)

## 2023-01-29 LAB — BRAIN NATRIURETIC PEPTIDE: B Natriuretic Peptide: 38.3 pg/mL (ref 0.0–100.0)

## 2023-01-29 LAB — RESP PANEL BY RT-PCR (RSV, FLU A&B, COVID)  RVPGX2
Influenza A by PCR: NEGATIVE
Influenza B by PCR: NEGATIVE
Resp Syncytial Virus by PCR: NEGATIVE
SARS Coronavirus 2 by RT PCR: NEGATIVE

## 2023-01-29 LAB — URINALYSIS, ROUTINE W REFLEX MICROSCOPIC
Bacteria, UA: NONE SEEN
Bilirubin Urine: NEGATIVE
Glucose, UA: NEGATIVE mg/dL
Hgb urine dipstick: NEGATIVE
Ketones, ur: NEGATIVE mg/dL
Leukocytes,Ua: NEGATIVE
Nitrite: NEGATIVE
Protein, ur: NEGATIVE mg/dL
Specific Gravity, Urine: 1.005 (ref 1.005–1.030)
pH: 6 (ref 5.0–8.0)

## 2023-01-29 LAB — TROPONIN I (HIGH SENSITIVITY): Troponin I (High Sensitivity): 8 ng/L (ref <18)

## 2023-01-29 LAB — CBG MONITORING, ED: Glucose-Capillary: 111 mg/dL — ABNORMAL HIGH (ref 70–99)

## 2023-01-29 MED ORDER — IPRATROPIUM-ALBUTEROL 0.5-2.5 (3) MG/3ML IN SOLN
3.0000 mL | Freq: Once | RESPIRATORY_TRACT | Status: AC
  Administered 2023-01-29: 3 mL via RESPIRATORY_TRACT
  Filled 2023-01-29: qty 3

## 2023-01-29 MED ORDER — FUROSEMIDE 10 MG/ML IJ SOLN
20.0000 mg | Freq: Once | INTRAMUSCULAR | Status: AC
  Administered 2023-01-29: 20 mg via INTRAVENOUS
  Filled 2023-01-29: qty 4

## 2023-01-29 MED ORDER — NITROGLYCERIN 0.4 MG SL SUBL
0.4000 mg | SUBLINGUAL_TABLET | Freq: Once | SUBLINGUAL | Status: AC
  Administered 2023-01-29: 0.4 mg via SUBLINGUAL
  Filled 2023-01-29: qty 1

## 2023-01-29 NOTE — Telephone Encounter (Signed)
Pt daughter calling in bc pt gets up her breathing gets off and when she check her levels she drop down to 80

## 2023-01-29 NOTE — ED Provider Notes (Signed)
[facility] AT [facility] Provider Note   CSN: [id number] Arrival date & time: 01/29/23  1719     History  Chief Complaint  Patient presents with   Weakness    Wendy Torres is a 82 y.o. female.  With past history of COPD on 4 L at baseline and congestive heart failure presents to the ED for generalized weakness.  Generalized weakness beginning today along with desaturations on home pulse oximetry.  She is on 4 L at baseline and has dropped to the low 80s and even 70s while walking around which is unusual for her.  She has been compliant with nebulizer treatments at home.  Notes pain in both legs.  No chest pain, fevers, chills nausea, vomiting abdominal pain or urinary symptoms.  Recent admission for acute hypoxemic respiratory failure 2 weeks ago suspected to be due to COPD exacerbation   Weakness      Home Medications Prior to Admission medications   Medication Sig Start Date End Date Taking? Authorizing Provider  acetaminophen (TYLENOL) 325 MG tablet Take 2 tablets (650 mg total) by mouth every 6 (six) hours as needed for mild pain (or Fever >/= 101). Patient taking differently: Take 650 mg by mouth every 6 (six) hours as needed for mild pain. 02/19/14   Perkins, Alexzandrew L, PA-C  albuterol (VENTOLIN HFA) 108 (90 Base) MCG/ACT inhaler INHALE 2 PUFFS INTO THE LUNGS EVERY 4 HOURS AS NEEDED FOR WHEEZING OR SHORTNESS OF BREATH. Patient taking differently: Inhale 2 puffs into the lungs as needed for wheezing or shortness of breath. 11/01/21   Verlee Monte, MD  ALPRAZolam Prudy Feeler) 0.25 MG tablet TAKE 1 TABLET BY MOUTH TWICE A DAY AS NEEDED Patient taking differently: Take 0.25 mg by mouth as needed for anxiety. 10/04/22   Corwin Levins, MD  amLODipine (NORVASC) 10 MG tablet Take 1 tablet (10 mg total) by mouth daily. 11/14/22   Corwin Levins, MD  aspirin EC 81 MG tablet Take 1 tablet (81 mg total) by mouth daily. Swallow whole. 01/22/23   Rolly Salter,  MD  cloNIDine (CATAPRES) 0.1 MG tablet TAKE 1 TABLET BY MOUTH TWICE A DAY 11/23/22   Corwin Levins, MD  Fexofenadine HCl [facility] ALLERGY PO) Take 1 tablet by mouth as needed (congestion).    [provider]  fluconazole (DIFLUCAN) 200 MG tablet Take 1 tablet (200 mg total) by mouth daily for 12 days. 01/20/23 02/01/23  Rolly Salter, MD  fluocinolone (SYNALAR) 0.01 % external solution APPLY TOPICALLY 2 TIMES DAILY AS NEEDED. 07/03/22   Verlee Monte, MD  Fluticasone-Umeclidin-Vilant (TRELEGY ELLIPTA) 200-62.5-25 MCG/ACT AEPB Inhale 1 puff into the lungs daily. 03/08/22   Chilton Greathouse, MD  furosemide (LASIX) 20 MG tablet TAKE 1 TABLET BY MOUTH IN THE  MORNING 09/26/22   Corwin Levins, MD  gabapentin (NEURONTIN) 600 MG tablet Take 1 tablet (600 mg total) by mouth 3 (three) times daily. 01/25/23   Corwin Levins, MD  guaiFENesin (MUCINEX) 600 MG 12 hr tablet Take 1 tablet (600 mg total) by mouth 2 (two) times daily. 01/19/23   Rolly Salter, MD  hydrALAZINE (APRESOLINE) 100 MG tablet Take 1 tablet (100 mg total) by mouth 3 (three) times daily. 12/25/22   Corwin Levins, MD  iron polysaccharides (NU-IRON) 150 MG capsule Take 1 capsule (150 mg total) by mouth daily. 11/10/22   Corwin Levins, MD  losartan (COZAAR) 100 MG tablet Take 1 tablet (  100 mg total) by mouth daily. 11/14/22   Corwin Levins, MD  lovastatin (MEVACOR) 40 MG tablet Take 1 tablet (40 mg total) by mouth at bedtime. 02/02/23   Rolly Salter, MD  meclizine (ANTIVERT) 12.5 MG tablet TAKE 1 TABLET BY MOUTH 3 TIMES A DAY AS NEEDED FOR DIZZINESS Patient taking differently: Take 12.5 mg by mouth 3 (three) times daily as needed for dizziness or nausea. 06/22/21   Corwin Levins, MD  nystatin (MYCOSTATIN) 100000 UNIT/ML suspension Swish and spit 5mL two times daily after using Symbicort. Patient taking differently: Use as directed 5 mLs in the mouth or throat in the morning and at bedtime. Swish and spit 11/09/22   Corwin Levins, MD   pantoprazole (PROTONIX) 40 MG tablet TAKE 1 TABLET BY MOUTH DAILY Patient taking differently: Take 40 mg by mouth 2 (two) times daily. 06/26/22   Corwin Levins, MD  polyethylene glycol (MIRALAX / GLYCOLAX) 17 g packet Take 17 g by mouth daily as needed for mild constipation or moderate constipation.    [provider]  predniSONE (DELTASONE) 10 MG tablet Take 20mg  daily for 3days,Take 10mg  daily for 3days, then stop 01/20/23   Rolly Salter, MD  solifenacin (VESICARE) 10 MG tablet Take 1 tablet (10 mg total) by mouth daily. 11/23/22   Corwin Levins, MD  tiZANidine (ZANAFLEX) 4 MG tablet TAKE 1 TABLET BY MOUTH EVERY 6 HOURS AS NEEDED FOR MUSCLE SPASMS. Patient taking differently: Take 4 mg by mouth every 6 (six) hours as needed for muscle spasms. 11/23/22   Corwin Levins, MD  traMADol (ULTRAM) 50 MG tablet Take 1 tablet (50 mg total) by mouth every 6 (six) hours as needed. for pain Patient taking differently: Take 50 mg by mouth in the morning and at bedtime. 12/25/22   Corwin Levins, MD  traZODone (DESYREL) 50 MG tablet TAKE 1/2 TO 1 TABLET BY MOUTH AT BEDTIME AS NEEDED FOR SLEEP Patient taking differently: Take 25-50 mg by mouth at bedtime as needed for sleep. 11/23/22   Corwin Levins, MD  triamcinolone cream (KENALOG) 0.1 % APPLY TO AFFECTED AREA TWICE A DAY Patient taking differently: Apply 1 Application topically as needed (eczema). 07/10/22   Corwin Levins, MD  Turmeric (QC TUMERIC COMPLEX PO) Take 1,000 mg by mouth daily.    [provider]      Allergies    Chlorthalidone    Review of Systems   Review of Systems  Neurological:  Positive for weakness.    Physical Exam Updated Vital Signs BP (!) 178/85   Pulse 71   Temp 98.3 F (36.8 C)   Resp (!) 23   Ht 5\' 4"  (1.626 m)   Wt 90.2 kg   SpO2 98%   BMI 34.13 kg/m  Physical Exam Vitals and nursing note reviewed.  HENT:     Head: Normocephalic and atraumatic.  Eyes:     Pupils: Pupils are equal, round, and  reactive to light.  Cardiovascular:     Rate and Rhythm: Normal rate and regular rhythm.  Pulmonary:     Effort: Pulmonary effort is normal.     Comments: Crackles at the left base but no wheezing Abdominal:     Palpations: Abdomen is soft.     Tenderness: There is no abdominal tenderness.  Skin:    General: Skin is warm and dry.  Neurological:     Mental Status: She is alert.  Psychiatric:  Mood and Affect: Mood normal.     ED Results / Procedures / Treatments   Labs (all labs ordered are listed, but only abnormal results are displayed) Labs Reviewed  CBC - Abnormal; Notable for the following components:      Result Value   WBC 12.7 (*)    RBC 3.68 (*)    Hemoglobin 9.4 (*)    HCT 31.0 (*)    MCH 25.5 (*)    RDW 16.0 (*)    All other components within normal limits  URINALYSIS, ROUTINE W REFLEX MICROSCOPIC - Abnormal; Notable for the following components:   Color, Urine STRAW (*)    All other components within normal limits  CBG MONITORING, ED - Abnormal; Notable for the following components:   Glucose-Capillary 111 (*)    All other components within normal limits  RESP PANEL BY RT-PCR (RSV, FLU A&B, COVID)  RVPGX2  BRAIN NATRIURETIC PEPTIDE  BASIC METABOLIC PANEL  TROPONIN I (HIGH SENSITIVITY)  TROPONIN I (HIGH SENSITIVITY)    EKG EKG Interpretation Date/Time:  Monday January 29 2023 17:44:17 EDT Ventricular Rate:  78 PR Interval:  152 QRS Duration:  83 QT Interval:  360 QTC Calculation: 410 R Axis:   25  Text Interpretation: Sinus rhythm Ventricular premature complex Probable left atrial enlargement Confirmed by Estelle June [id number]) on 01/29/2023 11:25:19 PM  Radiology DG Chest Portable 1 View  Result Date: 01/29/2023 CLINICAL DATA:  Shortness of breath.  History of COPD.  Chest pain. EXAM: PORTABLE CHEST 1 VIEW COMPARISON:  01/15/2023 FINDINGS: Cardiac enlargement. No vascular congestion, edema, or consolidation. No pleural effusions. No  pneumothorax. Mediastinal contours appear intact. Degenerative changes in the spine and shoulders. IMPRESSION: Cardiac enlargement.  No evidence of active pulmonary disease. Electronically Signed   By: Burman Nieves M.D.   On: 01/29/2023 20:41    Procedures Procedures    Medications Ordered in ED Medications  furosemide (LASIX) injection 20 mg (20 mg Intravenous Given 01/29/23 1839)  ipratropium-albuterol (DUONEB) 0.5-2.5 (3) MG/3ML nebulizer solution 3 mL (3 mLs Nebulization Given 01/29/23 1835)  nitroGLYCERIN (NITROSTAT) SL tablet 0.4 mg (0.4 mg Sublingual Given 01/29/23 1842)    ED Course/ Medical Decision Making/ A&P Clinical Course as of 01/29/23 2325  Mon Jan 29, 2023  2321 Laboratory workup shows no urinary tract infection significant leukocytosis.  high sensitive troponin of 8 not consistent with ACS.  BNP is not elevated.  No evidence of overt fluid overload on chest x-ray.  Patient now on her home oxygen satting well reports feeling better than earlier.  Discussed rationale for admission with observation versus discharge home and close PCP follow-up.  Patient and her family at bedside are amenable with plan for discharge home and close PCP follow-up and understand of her return precautions [MP]    Clinical Course User Index [MP] Royanne Foots, DO                                 Medical Decision Making 82 year old female with history as above presenting for shortness of breath, hypoxemia and generalized weakness.  On 4 L at baseline.  Initial vital signs notable for significant hypertension with systolic of 190s and tachypnea.  Afebrile.  Exam reveals no wheezing but does have crackling at the left base.  Presentation most consistent with acute heart failure exacerbation but there may be a superimposed component of COPD.  Will give her dose of IV Lasix  here sublingual nitro and also try nebulized albuterol ipratropium treatment to see if this helps with her breathing.  Will obtain  chest x-ray to look for pneumonia pulmonary edema along with laboratory workup including UA to look for UTI as potential source of her generalized weakness.  Will obtain high sensitive troponin as well given significant cardiac history and shortness of breath to evaluate for ACS.  Amount and/or Complexity of Data Reviewed Labs: ordered. Radiology: ordered.  Risk Prescription drug management.           Final Clinical Impression(s) / ED Diagnoses Final diagnoses:  Dyspnea, unspecified type  Chronic obstructive pulmonary disease, unspecified COPD type [facility])    Rx / DC Orders ED Discharge Orders     None         Royanne Foots, DO 01/29/23 2326

## 2023-01-29 NOTE — Discharge Instructions (Addendum)
You were seen in the emergency department for shortness of breath and low oxygen Your oxygen was not significantly low here Your chest x-ray did not show any evidence of pneumonia or fluid overload Your blood work looked okay as did your EKG Given that you have been stable on your home oxygen we feel comfortable with you going home so long as you follow-up with your primary care doctor later this week Return to the emergency department for chest pain trouble breathing or any other concerns

## 2023-01-29 NOTE — ED Triage Notes (Signed)
BIB EMS due to weakness and decreased 02 sats. Pt is on 4L [place] at home. Pt also c/o leg pain. CBG 139 172/70-80-94% 4L, 16

## 2023-01-29 NOTE — Telephone Encounter (Signed)
Lm x1 for the patient.

## 2023-01-30 NOTE — Telephone Encounter (Signed)
Patient's daughter is calling back to return call. She had to take mother to the emergency room yesterday. Daughter states patient is doing a little butter but her oxygen levels are still dropping when she stands up(70s).

## 2023-01-31 ENCOUNTER — Encounter: Payer: Self-pay | Admitting: Internal Medicine

## 2023-01-31 ENCOUNTER — Ambulatory Visit (INDEPENDENT_AMBULATORY_CARE_PROVIDER_SITE_OTHER): Payer: Medicare Other | Admitting: Internal Medicine

## 2023-01-31 VITALS — BP 150/66 | HR 74 | Temp 99.7°F | Ht 64.0 in

## 2023-01-31 DIAGNOSIS — J441 Chronic obstructive pulmonary disease with (acute) exacerbation: Secondary | ICD-10-CM | POA: Diagnosis not present

## 2023-01-31 DIAGNOSIS — E559 Vitamin D deficiency, unspecified: Secondary | ICD-10-CM | POA: Diagnosis not present

## 2023-01-31 DIAGNOSIS — M5416 Radiculopathy, lumbar region: Secondary | ICD-10-CM | POA: Diagnosis not present

## 2023-01-31 DIAGNOSIS — R911 Solitary pulmonary nodule: Secondary | ICD-10-CM

## 2023-01-31 DIAGNOSIS — I5032 Chronic diastolic (congestive) heart failure: Secondary | ICD-10-CM | POA: Diagnosis not present

## 2023-01-31 DIAGNOSIS — I10 Essential (primary) hypertension: Secondary | ICD-10-CM | POA: Diagnosis not present

## 2023-01-31 NOTE — Telephone Encounter (Signed)
Patients daughter is trying to return call.

## 2023-01-31 NOTE — Patient Instructions (Addendum)
Ok to for the [facility] and bedrail to be faxed to [facility] for the Nebulizer machine and medications - to be faxed as well  Please continue all other medications as before, and refills have been done if requested.  Please have the pharmacy call with any other refills you may need.  Please continue your efforts at being more active, low cholesterol diet, and weight control..  Please keep your appointments with your specialists as you may have planned  You will be contacted regarding the referral for: Cardiology, and MRI for the lower back  We can hold on lab testing today  Please make an Appointment to return in 4 months, or sooner if needed

## 2023-01-31 NOTE — Telephone Encounter (Signed)
I called the pt's daughter and there was no answer- LMTCB.

## 2023-01-31 NOTE — Telephone Encounter (Signed)
Spoke with the pt's daughter, Rinaldo Cloud  She reports that over the past several days pt's sats have been decreased  She is between 70-80% with exertion on her 4lpm o2  She has felt more SOB  She went to ED on 01/29/23 but was not admitted  She was told her tests were normal  She is not coughing, wheezing, fevers, aches  She does not have appt until 02/22/23 here and there are no sooner openings  Please advise thanks!

## 2023-02-01 ENCOUNTER — Encounter: Payer: Self-pay | Admitting: Internal Medicine

## 2023-02-01 ENCOUNTER — Ambulatory Visit: Payer: Medicare Other | Admitting: Internal Medicine

## 2023-02-01 DIAGNOSIS — E559 Vitamin D deficiency, unspecified: Secondary | ICD-10-CM

## 2023-02-01 LAB — ALBUMIN: Albumin: 3.8 g/dL (ref 3.5–5.2)

## 2023-02-01 LAB — VITAMIN D 25 HYDROXY (VIT D DEFICIENCY, FRACTURES): VITD: 21.95 ng/mL — ABNORMAL LOW (ref 30.00–100.00)

## 2023-02-01 NOTE — Telephone Encounter (Signed)
Called the patient and 2 daughters [place] and Rinaldo Cloud with no answer.  Left voicemail for Octavio Manns requesting a call back.

## 2023-02-01 NOTE — Patient Instructions (Signed)
Stay hydrated Avoid over-the-counter calcium tablets Make sure you are consuming 2-3 servings of dietary calcium daily (for example low-fat yogurt, low-fat milk, and green leafy vegetables)

## 2023-02-01 NOTE — Progress Notes (Signed)
Name: Wendy Torres  MRN/ DOB: [id number], Jun 09, 1940    Age/ Sex: 82 y.o., female    PCP: Corwin Levins, MD   Reason for Endocrinology Evaluation: Hypercalcemia     Date of Initial Endocrinology Evaluation: 02/01/2023     HPI: Wendy Torres is a 82 y.o. female with a past medical history of HTN, CHF, COPD, and dyslipidemia . The patient presented for initial endocrinology clinic visit on 02/01/2023 for consultative assistance with her hypercalcemia.   Wendy Torres indicates that she was noted with intermittent hypercalcemia since 2008, with a serial max 11.3 Mg/DL 07/979 (corrected 19.14), patient with fluctuating GFR. She has been noted with elevated PTH in 2017 at 76 PG/mL  Has had a history of low vitamin D at 24.11 in 10/2022  She is in a wheelchair today, accompanied by  her daughter who provided most of the history  The patient is having musculoskeletal neck pain, that is believed to be related to the way she slept last night She does have occasional knee pains  She denies  constipation, polydipsia but has frequency due to furosemide     She  use of over the counter calcium in the form of Tums No Vitamin D  or hydrochlorothiazide   She denies  history of kidney stones, kidney disease, liver disease, granulomatous disease.   She denies  osteoporosis or prior fractures, last DXA 06/2012 with osteopenia.  Daily dietary calcium intake: 2 servings   She family history of osteoporosis ( daughter), but no parathyroid disease, thyroid disease.    HISTORY:  Past Medical History:  Past Medical History:  Diagnosis Date   ABNORMAL CHEST XRAY 11/05/2008   ACUTE URIS OF UNSPECIFIED SITE 10/22/2007   ALLERGIC RHINITIS 12/09/2006   ANEMIA 01/14/2009   Anxiety    Arthritis    ASTHMA 12/09/2006   Asthma    Cataract    bilateral -removed   CHF (congestive heart failure) (HCC)    Chronic pain syndrome 02/16/2009   COPD 12/09/2006   DEGENERATIVE JOINT DISEASE 12/09/2006    DIZZINESS 03/30/2010   DYSPNEA 01/27/2010   with heavy exertion   FREQUENCY, URINARY 03/30/2010   GERD 12/09/2006   Glossitis 01/14/2009   HELICOBACTER PYLORI GASTRITIS, HX OF 12/09/2006   HIATAL HERNIA 03/01/2010   History of hiatal hernia    HYPERLIPIDEMIA 04/18/2007   HYPERSOMNIA 08/31/2008   HYPERTENSION 12/09/2006   INSOMNIA-SLEEP DISORDER-UNSPEC 11/05/2008   LOW BACK PAIN 12/09/2006   Morbid obesity (HCC) 12/09/2006   Nonspecific (abnormal) findings on radiological and other examination of body structure 11/05/2008   OSTEOPOROSIS 12/09/2006   OVERACTIVE BLADDER 04/14/2008   PALPITATIONS, RECURRENT 01/27/2010   Stroke (HCC)    pt. stated light one years ago   SYNCOPE 11/05/2008   TRANSIENT ISCHEMIC ATTACK, HX OF 12/09/2006   Past Surgical History:  Past Surgical History:  Procedure Laterality Date   BIOPSY  01/18/2023   Procedure: BIOPSY;  Surgeon: Meryl Dare, MD;  Location: [facility];  Service: Gastroenterology;;   COLONOSCOPY     ESOPHAGOGASTRODUODENOSCOPY (EGD) WITH PROPOFOL N/A 01/18/2023   Procedure: ESOPHAGOGASTRODUODENOSCOPY (EGD) WITH PROPOFOL;  Surgeon: Meryl Dare, MD;  Location: [facility];  Service: Gastroenterology;  Laterality: N/A;   HOT HEMOSTASIS N/A 01/18/2023   Procedure: HOT HEMOSTASIS (ARGON PLASMA COAGULATION/BICAP);  Surgeon: Meryl Dare, MD;  Location: [facility];  Service: Gastroenterology;  Laterality: N/A;   LUMBAR DISC SURGERY     TONSILLECTOMY     as  a child   TOTAL HIP ARTHROPLASTY Right 11/06/2012   Procedure: RIGHT TOTAL HIP ARTHROPLASTY WITH ACETABULAR AUTOGRAFT;  Surgeon: Loanne Drilling, MD;  Location: [facility];  Service: Orthopedics;  Laterality: Right;   TOTAL HIP ARTHROPLASTY Left 02/18/2014   Procedure: LEFT TOTAL HIP ARTHROPLASTY;  Surgeon: Loanne Drilling, MD;  Location: [facility];  Service: Orthopedics;  Laterality: Left;   TUBAL LIGATION      Social History:  reports that she quit smoking about 50 years ago. Her  smoking use included cigarettes. She started smoking about 75 years ago. She has a 12.5 pack-year smoking history. She has never used smokeless tobacco. She reports that she does not drink alcohol and does not use drugs. Family History: family history includes Heart disease in her mother; Hypertension in her brother; Lung cancer in her father.   HOME MEDICATIONS: Allergies as of 02/01/2023       Reactions   Chlorthalidone Shortness Of Breath        Medication List        Accurate as of February 01, 2023 11:09 AM. If you have any questions, ask your nurse or doctor.          acetaminophen 325 MG tablet Commonly known as: TYLENOL Take 2 tablets (650 mg total) by mouth every 6 (six) hours as needed for mild pain (or Fever >/= 101). What changed: reasons to take this   albuterol 108 (90 Base) MCG/ACT inhaler Commonly known as: VENTOLIN HFA INHALE 2 PUFFS INTO THE LUNGS EVERY 4 HOURS AS NEEDED FOR WHEEZING OR SHORTNESS OF BREATH. What changed: when to take this   ALPRAZolam 0.25 MG tablet Commonly known as: XANAX TAKE 1 TABLET BY MOUTH TWICE A DAY AS NEEDED What changed:  when to take this reasons to take this   amLODipine 10 MG tablet Commonly known as: NORVASC Take 1 tablet (10 mg total) by mouth daily.   aspirin EC 81 MG tablet Take 1 tablet (81 mg total) by mouth daily. Swallow whole.   cloNIDine 0.1 MG tablet Commonly known as: CATAPRES TAKE 1 TABLET BY MOUTH TWICE A DAY   fluconazole 200 MG tablet Commonly known as: DIFLUCAN Take 1 tablet (200 mg total) by mouth daily for 12 days.   fluocinolone 0.01 % external solution Commonly known as: SYNALAR APPLY TOPICALLY 2 TIMES DAILY AS NEEDED.   furosemide 20 MG tablet Commonly known as: LASIX TAKE 1 TABLET BY MOUTH IN THE  MORNING   gabapentin 600 MG tablet Commonly known as: Neurontin Take 1 tablet (600 mg total) by mouth 3 (three) times daily.   guaiFENesin 600 MG 12 hr tablet Commonly known as:  MUCINEX Take 1 tablet (600 mg total) by mouth 2 (two) times daily.   hydrALAZINE 100 MG tablet Commonly known as: APRESOLINE Take 1 tablet (100 mg total) by mouth 3 (three) times daily.   iron polysaccharides 150 MG capsule Commonly known as: Nu-Iron Take 1 capsule (150 mg total) by mouth daily.   losartan 100 MG tablet Commonly known as: COZAAR Take 1 tablet (100 mg total) by mouth daily.   lovastatin 40 MG tablet Commonly known as: MEVACOR Take 1 tablet (40 mg total) by mouth at bedtime. Start taking on: February 02, 2023   meclizine 12.5 MG tablet Commonly known as: ANTIVERT TAKE 1 TABLET BY MOUTH 3 TIMES A DAY AS NEEDED FOR DIZZINESS What changed: See the new instructions.   MUCINEX ALLERGY PO Take 1 tablet by mouth as needed (congestion).  nystatin 100000 UNIT/ML suspension Commonly known as: MYCOSTATIN Swish and spit 5mL two times daily after using Symbicort. What changed:  how much to take how to take this when to take this additional instructions   pantoprazole 40 MG tablet Commonly known as: PROTONIX TAKE 1 TABLET BY MOUTH DAILY   polyethylene glycol 17 g packet Commonly known as: MIRALAX / GLYCOLAX Take 17 g by mouth daily as needed for mild constipation or moderate constipation.   predniSONE 10 MG tablet Commonly known as: DELTASONE Take 20mg  daily for 3days,Take 10mg  daily for 3days, then stop   QC TUMERIC COMPLEX PO Take 1,000 mg by mouth daily.   solifenacin 10 MG tablet Commonly known as: VESICARE Take 1 tablet (10 mg total) by mouth daily.   tiZANidine 4 MG tablet Commonly known as: ZANAFLEX TAKE 1 TABLET BY MOUTH EVERY 6 HOURS AS NEEDED FOR MUSCLE SPASMS.   traMADol 50 MG tablet Commonly known as: ULTRAM Take 1 tablet (50 mg total) by mouth every 6 (six) hours as needed. for pain What changed:  when to take this additional instructions   traZODone 50 MG tablet Commonly known as: DESYREL TAKE 1/2 TO 1 TABLET BY MOUTH AT BEDTIME AS  NEEDED FOR SLEEP What changed:  reasons to take this additional instructions   Trelegy Ellipta 200-62.5-25 MCG/ACT Aepb Generic drug: Fluticasone-Umeclidin-Vilant Inhale 1 puff into the lungs daily.   triamcinolone cream 0.1 % Commonly known as: KENALOG APPLY TO AFFECTED AREA TWICE A DAY What changed: See the new instructions.          REVIEW OF SYSTEMS: A comprehensive ROS was conducted with the patient and is negative except as per HPI     OBJECTIVE:  VS: BP 132/70 (BP Location: Left Arm, Patient Position: Sitting, Cuff Size: Large)   Pulse 77   Ht 5\' 4"  (1.626 m)   Wt 204 lb (92.5 kg) Comment: patient reported  SpO2 94%   BMI 35.02 kg/m    Wt Readings from Last 3 Encounters:  02/01/23 204 lb (92.5 kg)  01/29/23 198 lb 12.9 oz (90.2 kg)  01/18/23 200 lb (90.7 kg)     EXAM: General: Pt appears well and is in NAD  Neck: General: Right-sided tenderness over sternocleidomastoid Thyroid: Thyroid size normal.  No goiter or nodules appreciated  Lungs: Clear with good BS bilat   Heart: Auscultation: RRR.  Extremities:  BL LE: No pretibial edema   Mental Status: Judgment, insight: Intact Orientation: Oriented to time, place, and person Mood and affect: No depression, anxiety, or agitation     DATA REVIEWED:  Latest Reference Range & Units 02/01/23 11:19  Calcium 8.6 - 10.4 mg/dL 29.5  Calcium Ionized 4.7 - 5.5 mg/dL 5.4  Albumin 3.5 - 5.2 g/dL 3.8     Latest Reference Range & Units 02/01/23 11:19  VITD 30.00 - 100.00 ng/mL 21.95 (L)    Latest Reference Range & Units 02/01/23 11:19  PTH, Intact 16 - 77 pg/mL 94 (H)  (H): Data is abnormally high   Old records , labs and images have been reviewed.    ASSESSMENT/PLAN/RECOMMENDATIONS:   Hypercalcemia :  -PTH mediated -Patient is not a surgical candidate for parathyroidectomy, we will treat medically -Will not proceed with 24-hour urinary calcium excretion, as she is on furosemide and her results will  be skewed -Repeat labs normal ionized calcium, elevated PTH and low vitamin D  Recommendations Stay hydrated Avoid over-the-counter calcium tablets Consume 2-3 servings of dietary calcium daily  2.  Vitamin D insufficiency:  -This is partially responsible for elevated PTH  -Patient will be advised to start OTC vitamin D3 2000 IU daily  Follow-up in 6 months  Signed electronically by: Lyndle Herrlich, MD  [facility] Endocrinology  [facility] [address] [place], [place] [address] Phone: [phone] FAX: [phone]   CC: Corwin Levins, MD [address] [place] [address] Phone: [phone] Fax: [phone]   Return to Endocrinology clinic as below: Future Appointments  Date Time Provider Department Center  02/08/2023 11:30 AM Javier Glazier [facility]  02/22/2023  3:00 PM Chilton Greathouse, MD [facility] None  02/23/2023 11:00 AM Levert Feinstein, MD [facility] None  02/28/2023 11:40 AM Thomasene Ripple, DO [facility] None  06/04/2023 10:40 AM Corwin Levins, MD [facility] None

## 2023-02-02 DIAGNOSIS — Q2733 Arteriovenous malformation of digestive system vessel: Secondary | ICD-10-CM | POA: Diagnosis not present

## 2023-02-02 DIAGNOSIS — J4489 Other specified chronic obstructive pulmonary disease: Secondary | ICD-10-CM | POA: Diagnosis not present

## 2023-02-02 DIAGNOSIS — K449 Diaphragmatic hernia without obstruction or gangrene: Secondary | ICD-10-CM | POA: Diagnosis not present

## 2023-02-02 DIAGNOSIS — I5032 Chronic diastolic (congestive) heart failure: Secondary | ICD-10-CM | POA: Diagnosis not present

## 2023-02-02 DIAGNOSIS — R911 Solitary pulmonary nodule: Secondary | ICD-10-CM | POA: Diagnosis not present

## 2023-02-02 DIAGNOSIS — R350 Frequency of micturition: Secondary | ICD-10-CM | POA: Diagnosis not present

## 2023-02-02 DIAGNOSIS — Z87891 Personal history of nicotine dependence: Secondary | ICD-10-CM | POA: Diagnosis not present

## 2023-02-02 DIAGNOSIS — J441 Chronic obstructive pulmonary disease with (acute) exacerbation: Secondary | ICD-10-CM | POA: Diagnosis not present

## 2023-02-02 DIAGNOSIS — M810 Age-related osteoporosis without current pathological fracture: Secondary | ICD-10-CM | POA: Diagnosis not present

## 2023-02-02 DIAGNOSIS — M5416 Radiculopathy, lumbar region: Secondary | ICD-10-CM

## 2023-02-02 DIAGNOSIS — I272 Pulmonary hypertension, unspecified: Secondary | ICD-10-CM | POA: Diagnosis not present

## 2023-02-02 DIAGNOSIS — K219 Gastro-esophageal reflux disease without esophagitis: Secondary | ICD-10-CM | POA: Diagnosis not present

## 2023-02-02 DIAGNOSIS — E785 Hyperlipidemia, unspecified: Secondary | ICD-10-CM | POA: Diagnosis not present

## 2023-02-02 DIAGNOSIS — K921 Melena: Secondary | ICD-10-CM | POA: Diagnosis not present

## 2023-02-02 DIAGNOSIS — M5412 Radiculopathy, cervical region: Secondary | ICD-10-CM | POA: Diagnosis not present

## 2023-02-02 DIAGNOSIS — Z8673 Personal history of transient ischemic attack (TIA), and cerebral infarction without residual deficits: Secondary | ICD-10-CM | POA: Diagnosis not present

## 2023-02-02 DIAGNOSIS — D539 Nutritional anemia, unspecified: Secondary | ICD-10-CM | POA: Diagnosis not present

## 2023-02-02 DIAGNOSIS — G894 Chronic pain syndrome: Secondary | ICD-10-CM | POA: Diagnosis not present

## 2023-02-02 DIAGNOSIS — G4700 Insomnia, unspecified: Secondary | ICD-10-CM | POA: Diagnosis not present

## 2023-02-02 DIAGNOSIS — E876 Hypokalemia: Secondary | ICD-10-CM | POA: Diagnosis not present

## 2023-02-02 DIAGNOSIS — J9621 Acute and chronic respiratory failure with hypoxia: Secondary | ICD-10-CM | POA: Diagnosis not present

## 2023-02-02 DIAGNOSIS — I110 Hypertensive heart disease with heart failure: Secondary | ICD-10-CM | POA: Diagnosis not present

## 2023-02-02 DIAGNOSIS — M179 Osteoarthritis of knee, unspecified: Secondary | ICD-10-CM | POA: Diagnosis not present

## 2023-02-03 ENCOUNTER — Encounter: Payer: Self-pay | Admitting: Internal Medicine

## 2023-02-03 DIAGNOSIS — M5416 Radiculopathy, lumbar region: Secondary | ICD-10-CM | POA: Insufficient documentation

## 2023-02-03 DIAGNOSIS — R911 Solitary pulmonary nodule: Secondary | ICD-10-CM | POA: Insufficient documentation

## 2023-02-03 LAB — CALCIUM, IONIZED: Calcium, Ion: 5.4 mg/dL (ref 4.7–5.5)

## 2023-02-03 LAB — PTH, INTACT AND CALCIUM
Calcium: 10.2 mg/dL (ref 8.6–10.4)
PTH: 94 pg/mL — ABNORMAL HIGH (ref 16–77)

## 2023-02-03 NOTE — Assessment & Plan Note (Signed)
Last vitamin D Lab Results  Component Value Date   VD25OH 21.95 (L) 02/01/2023   Low, to start oral replacement

## 2023-02-03 NOTE — Assessment & Plan Note (Signed)
BP Readings from Last 3 Encounters:  02/01/23 132/70  01/31/23 (!) 150/66  01/29/23 (!) 178/85   Stable, pt to continue medical treatment norvasc 10 every day, clonidine 0.1 bid, hydralazine 100 tid, losartan 100 qd

## 2023-02-03 NOTE — Progress Notes (Addendum)
Patient ID: OTA RACER, female   DOB: 1940-06-03, 82 y.o.   MRN: [id number]        Chief Complaint: follow up right lumbar radiculopathy, chf, copd, pulm nodule, htn, low vit d       HPI:  Wendy Torres is a 82 y.o. female here overall doing ok with family, after ED visit 9/30 with clinical suspicion ofr CHF tx with diuresis in ED, cxr neg for acute process.  Has overall doing well since then, except has worsening right lower back pain and leg pain with intermittent dizziness,  Pt denies chest pain, increased sob or doe, wheezing, orthopnea, PND, increased LE swelling, palpitations, dizziness or syncope, and daughter asks for neb machinne, BSC and bedrail for home through [facility].  Also asks for referral Cardiology.  Needs BSC due to chronic gait disorder, right lumbar radiculopathy, high risk for fall.  Needs Beside Commode due to high risk of fall.  Also,  Needs Home Nebulizer as inhalers have not been as helpful with control of dyspnea.and  her condition supports the nebulizer use with all the supplies - mask, tubing, refills.           Wt Readings from Last 3 Encounters:  02/01/23 204 lb (92.5 kg)  01/29/23 198 lb 12.9 oz (90.2 kg)  01/18/23 200 lb (90.7 kg)   BP Readings from Last 3 Encounters:  02/01/23 132/70  01/31/23 (!) 150/66  01/29/23 (!) 178/85         Past Medical History:  Diagnosis Date   ABNORMAL CHEST XRAY 11/05/2008   ACUTE URIS OF UNSPECIFIED SITE 10/22/2007   ALLERGIC RHINITIS 12/09/2006   ANEMIA 01/14/2009   Anxiety    Arthritis    ASTHMA 12/09/2006   Asthma    Cataract    bilateral -removed   CHF (congestive heart failure) (HCC)    Chronic pain syndrome 02/16/2009   COPD 12/09/2006   DEGENERATIVE JOINT DISEASE 12/09/2006   DIZZINESS 03/30/2010   DYSPNEA 01/27/2010   with heavy exertion   FREQUENCY, URINARY 03/30/2010   GERD 12/09/2006   Glossitis 01/14/2009   HELICOBACTER PYLORI GASTRITIS, HX OF 12/09/2006   HIATAL HERNIA 03/01/2010    History of hiatal hernia    HYPERLIPIDEMIA 04/18/2007   HYPERSOMNIA 08/31/2008   HYPERTENSION 12/09/2006   INSOMNIA-SLEEP DISORDER-UNSPEC 11/05/2008   LOW BACK PAIN 12/09/2006   Morbid obesity (HCC) 12/09/2006   Nonspecific (abnormal) findings on radiological and other examination of body structure 11/05/2008   OSTEOPOROSIS 12/09/2006   OVERACTIVE BLADDER 04/14/2008   PALPITATIONS, RECURRENT 01/27/2010   Stroke (HCC)    pt. stated light one years ago   SYNCOPE 11/05/2008   TRANSIENT ISCHEMIC ATTACK, HX OF 12/09/2006   Past Surgical History:  Procedure Laterality Date   BIOPSY  01/18/2023   Procedure: BIOPSY;  Surgeon: Meryl Dare, MD;  Location: [facility];  Service: Gastroenterology;;   COLONOSCOPY     ESOPHAGOGASTRODUODENOSCOPY (EGD) WITH PROPOFOL N/A 01/18/2023   Procedure: ESOPHAGOGASTRODUODENOSCOPY (EGD) WITH PROPOFOL;  Surgeon: Meryl Dare, MD;  Location: [facility];  Service: Gastroenterology;  Laterality: N/A;   HOT HEMOSTASIS N/A 01/18/2023   Procedure: HOT HEMOSTASIS (ARGON PLASMA COAGULATION/BICAP);  Surgeon: Meryl Dare, MD;  Location: [facility];  Service: Gastroenterology;  Laterality: N/A;   LUMBAR DISC SURGERY     TONSILLECTOMY     as a child   TOTAL HIP ARTHROPLASTY Right 11/06/2012   Procedure: RIGHT TOTAL HIP ARTHROPLASTY WITH ACETABULAR AUTOGRAFT;  Surgeon: Loanne Drilling,  MD;  Location: [facility];  Service: Orthopedics;  Laterality: Right;   TOTAL HIP ARTHROPLASTY Left 02/18/2014   Procedure: LEFT TOTAL HIP ARTHROPLASTY;  Surgeon: Loanne Drilling, MD;  Location: [facility];  Service: Orthopedics;  Laterality: Left;   TUBAL LIGATION      reports that she quit smoking about 50 years ago. Her smoking use included cigarettes. She started smoking about 75 years ago. She has a 12.5 pack-year smoking history. She has never used smokeless tobacco. She reports that she does not drink alcohol and does not use drugs. family history includes Heart disease in her  mother; Hypertension in her brother; Lung cancer in her father. Allergies  Allergen Reactions   Chlorthalidone Shortness Of Breath   Current Outpatient Medications on File Prior to Visit  Medication Sig Dispense Refill   acetaminophen (TYLENOL) 325 MG tablet Take 2 tablets (650 mg total) by mouth every 6 (six) hours as needed for mild pain (or Fever >/= 101). (Patient taking differently: Take 650 mg by mouth every 6 (six) hours as needed for mild pain.) 40 tablet 0   albuterol (VENTOLIN HFA) 108 (90 Base) MCG/ACT inhaler INHALE 2 PUFFS INTO THE LUNGS EVERY 4 HOURS AS NEEDED FOR WHEEZING OR SHORTNESS OF BREATH. (Patient taking differently: Inhale 2 puffs into the lungs as needed for wheezing or shortness of breath.) 8.5 each 1   ALPRAZolam (XANAX) 0.25 MG tablet TAKE 1 TABLET BY MOUTH TWICE A DAY AS NEEDED (Patient taking differently: Take 0.25 mg by mouth as needed for anxiety.) 60 tablet 2   amLODipine (NORVASC) 10 MG tablet Take 1 tablet (10 mg total) by mouth daily. 90 tablet 3   aspirin EC 81 MG tablet Take 1 tablet (81 mg total) by mouth daily. Swallow whole.     cloNIDine (CATAPRES) 0.1 MG tablet TAKE 1 TABLET BY MOUTH TWICE A DAY 180 tablet 3   Fexofenadine HCl (MUCINEX ALLERGY PO) Take 1 tablet by mouth as needed (congestion).     fluocinolone (SYNALAR) 0.01 % external solution APPLY TOPICALLY 2 TIMES DAILY AS NEEDED. 60 mL 0   Fluticasone-Umeclidin-Vilant (TRELEGY ELLIPTA) 200-62.5-25 MCG/ACT AEPB Inhale 1 puff into the lungs daily. 60 each 2   furosemide (LASIX) 20 MG tablet TAKE 1 TABLET BY MOUTH IN THE  MORNING 100 tablet 2   gabapentin (NEURONTIN) 600 MG tablet Take 1 tablet (600 mg total) by mouth 3 (three) times daily. 270 tablet 1   guaiFENesin (MUCINEX) 600 MG 12 hr tablet Take 1 tablet (600 mg total) by mouth 2 (two) times daily. 30 tablet 0   hydrALAZINE (APRESOLINE) 100 MG tablet Take 1 tablet (100 mg total) by mouth 3 (three) times daily. 270 tablet 3   iron polysaccharides  (NU-IRON) 150 MG capsule Take 1 capsule (150 mg total) by mouth daily. 90 capsule 1   losartan (COZAAR) 100 MG tablet Take 1 tablet (100 mg total) by mouth daily. 90 tablet 3   lovastatin (MEVACOR) 40 MG tablet Take 1 tablet (40 mg total) by mouth at bedtime.     meclizine (ANTIVERT) 12.5 MG tablet TAKE 1 TABLET BY MOUTH 3 TIMES A DAY AS NEEDED FOR DIZZINESS (Patient taking differently: Take 12.5 mg by mouth 3 (three) times daily as needed for dizziness or nausea.) 30 tablet 2   nystatin (MYCOSTATIN) 100000 UNIT/ML suspension Swish and spit 5mL two times daily after using Symbicort. (Patient taking differently: Use as directed 5 mLs in the mouth or throat in the morning and at bedtime.  Swish and spit) 473 mL 5   pantoprazole (PROTONIX) 40 MG tablet TAKE 1 TABLET BY MOUTH DAILY 100 tablet 2   polyethylene glycol (MIRALAX / GLYCOLAX) 17 g packet Take 17 g by mouth daily as needed for mild constipation or moderate constipation.     predniSONE (DELTASONE) 10 MG tablet Take 20mg  daily for 3days,Take 10mg  daily for 3days, then stop (Patient not taking: Reported on 02/01/2023) 10 tablet 0   solifenacin (VESICARE) 10 MG tablet Take 1 tablet (10 mg total) by mouth daily. 90 tablet 1   tiZANidine (ZANAFLEX) 4 MG tablet TAKE 1 TABLET BY MOUTH EVERY 6 HOURS AS NEEDED FOR MUSCLE SPASMS. (Patient taking differently: Take 4 mg by mouth every 6 (six) hours as needed for muscle spasms.) 50 tablet 1   traMADol (ULTRAM) 50 MG tablet Take 1 tablet (50 mg total) by mouth every 6 (six) hours as needed. for pain (Patient taking differently: Take 50 mg by mouth in the morning and at bedtime.) 120 tablet 2   traZODone (DESYREL) 50 MG tablet TAKE 1/2 TO 1 TABLET BY MOUTH AT BEDTIME AS NEEDED FOR SLEEP (Patient taking differently: Take 25-50 mg by mouth at bedtime as needed for sleep.) 90 tablet 1   triamcinolone cream (KENALOG) 0.1 % APPLY TO AFFECTED AREA TWICE A DAY (Patient taking differently: Apply 1 Application topically as  needed (eczema).) 30 g 1   Turmeric (QC TUMERIC COMPLEX PO) Take 1,000 mg by mouth daily.     No current facility-administered medications on file prior to visit.        ROS:  All others reviewed and negative.  Objective        PE:  BP (!) 150/66 (BP Location: Left Arm, Patient Position: Sitting, Cuff Size: Large)   Pulse 74   Temp 99.7 F (37.6 C) (Oral)   Ht 5\' 4"  (1.626 m)   SpO2 (!) 81% Comment: patiet currently on oxygen  BMI 34.13 kg/m                 Constitutional: Pt appears in NAD               HENT: Head: NCAT.                Right Ear: External ear normal.                 Left Ear: External ear normal.                Eyes: . Pupils are equal, round, and reactive to light. Conjunctivae and EOM are normal               Nose: without d/c or deformity               Neck: Neck supple. Gross normal ROM               Cardiovascular: Normal rate and regular rhythm.                 Pulmonary/Chest: Effort normal and breath sounds without rales or wheezing.                Abd:  Soft, NT, ND, + BS, no organomegaly               Neurological: Pt is alert. At baseline orientation, motor with 4/5 RLE motor weakness               Skin: Skin is warm. No  rashes, no other new lesions, LE edema - none               Psychiatric: Pt behavior is normal without agitation   Micro: none  Cardiac tracings I have personally interpreted today:  none  Pertinent Radiological findings (summarize): none   Lab Results  Component Value Date   WBC 12.7 (H) 01/29/2023   HGB 9.4 (L) 01/29/2023   HCT 31.0 (L) 01/29/2023   PLT 222 01/29/2023   GLUCOSE 98 01/18/2023   CHOL 167 11/09/2022   TRIG 73.0 11/09/2022   HDL 63.10 11/09/2022   LDLCALC 89 11/09/2022   ALT 20 01/18/2023   AST 24 01/18/2023   NA 135 01/18/2023   K 3.2 (L) 01/18/2023   CL 101 01/18/2023   CREATININE 0.99 01/18/2023   BUN 20 01/18/2023   CO2 25 01/18/2023   TSH 0.85 11/09/2022   INR 1.02 02/10/2014   HGBA1C 5.7  11/09/2022   MICROALBUR 0.7 11/09/2022   Assessment/Plan:  Wendy Torres is a 82 y.o. Black or African American [2] female with  has a past medical history of ABNORMAL CHEST XRAY (11/05/2008), ACUTE URIS OF UNSPECIFIED SITE (10/22/2007), ALLERGIC RHINITIS (12/09/2006), ANEMIA (01/14/2009), Anxiety, Arthritis, ASTHMA (12/09/2006), Asthma, Cataract, CHF (congestive heart failure) (HCC), Chronic pain syndrome (02/16/2009), COPD (12/09/2006), DEGENERATIVE JOINT DISEASE (12/09/2006), DIZZINESS (03/30/2010), DYSPNEA (01/27/2010), FREQUENCY, URINARY (03/30/2010), GERD (12/09/2006), Glossitis (01/14/2009), HELICOBACTER PYLORI GASTRITIS, HX OF (12/09/2006), HIATAL HERNIA (03/01/2010), History of hiatal hernia, HYPERLIPIDEMIA (04/18/2007), HYPERSOMNIA (08/31/2008), HYPERTENSION (12/09/2006), INSOMNIA-SLEEP DISORDER-UNSPEC (11/05/2008), LOW BACK PAIN (12/09/2006), Morbid obesity (HCC) (12/09/2006), Nonspecific (abnormal) findings on radiological and other examination of body structure (11/05/2008), OSTEOPOROSIS (12/09/2006), OVERACTIVE BLADDER (04/14/2008), PALPITATIONS, RECURRENT (01/27/2010), Stroke (HCC), SYNCOPE (11/05/2008), and TRANSIENT ISCHEMIC ATTACK, HX OF (12/09/2006).  Chronic heart failure with preserved ejection fraction (HFpEF) (HCC) With recent clinical exacerbation now imrpoved, pt to cont current med tx, for cardiology referral  COPD with acute exacerbation (HCC) Overall improved as well, ok for home Neb machine with albuterol Nebs qid prn, BSC, bedrail   Essential hypertension BP Readings from Last 3 Encounters:  02/01/23 132/70  01/31/23 (!) 150/66  01/29/23 (!) 178/85   Stable, pt to continue medical treatment norvasc 10 every day, clonidine 0.1 bid, hydralazine 100 tid, losartan 100 qd   Vitamin D deficiency Last vitamin D Lab Results  Component Value Date   VD25OH 21.95 (L) 02/01/2023   Low, to start oral replacement   Solitary pulmonary nodule For f/u ct chest in 3  months  Right lumbar radiculopathy Ne recent worsening, marked difficutly with transfers and ambulation, for MRI LS spine  Followup: Return in about 4 months (around 06/03/2023).  Oliver Barre, MD 02/03/2023 10:08 PM [facility] - [facility]

## 2023-02-03 NOTE — Assessment & Plan Note (Signed)
Overall improved as well, ok for home Neb machine with albuterol Nebs qid prn, BSC, bedrail

## 2023-02-03 NOTE — Assessment & Plan Note (Signed)
Ne recent worsening, marked difficutly with transfers and ambulation, for MRI LS spine

## 2023-02-03 NOTE — Assessment & Plan Note (Signed)
For f/u ct chest in 3 months

## 2023-02-03 NOTE — Assessment & Plan Note (Addendum)
With recent clinical exacerbation now imrpoved, pt to cont current med tx, for cardiology referral

## 2023-02-05 ENCOUNTER — Telehealth: Payer: Self-pay | Admitting: Internal Medicine

## 2023-02-05 ENCOUNTER — Encounter (HOSPITAL_BASED_OUTPATIENT_CLINIC_OR_DEPARTMENT_OTHER): Payer: Self-pay

## 2023-02-05 DIAGNOSIS — D539 Nutritional anemia, unspecified: Secondary | ICD-10-CM | POA: Diagnosis not present

## 2023-02-05 DIAGNOSIS — J9621 Acute and chronic respiratory failure with hypoxia: Secondary | ICD-10-CM | POA: Diagnosis not present

## 2023-02-05 DIAGNOSIS — J441 Chronic obstructive pulmonary disease with (acute) exacerbation: Secondary | ICD-10-CM | POA: Diagnosis not present

## 2023-02-05 DIAGNOSIS — E785 Hyperlipidemia, unspecified: Secondary | ICD-10-CM | POA: Diagnosis not present

## 2023-02-05 DIAGNOSIS — I110 Hypertensive heart disease with heart failure: Secondary | ICD-10-CM | POA: Diagnosis not present

## 2023-02-05 DIAGNOSIS — M5412 Radiculopathy, cervical region: Secondary | ICD-10-CM | POA: Diagnosis not present

## 2023-02-05 DIAGNOSIS — K449 Diaphragmatic hernia without obstruction or gangrene: Secondary | ICD-10-CM | POA: Diagnosis not present

## 2023-02-05 DIAGNOSIS — I272 Pulmonary hypertension, unspecified: Secondary | ICD-10-CM | POA: Diagnosis not present

## 2023-02-05 DIAGNOSIS — K921 Melena: Secondary | ICD-10-CM | POA: Diagnosis not present

## 2023-02-05 DIAGNOSIS — R350 Frequency of micturition: Secondary | ICD-10-CM | POA: Diagnosis not present

## 2023-02-05 DIAGNOSIS — Z87891 Personal history of nicotine dependence: Secondary | ICD-10-CM | POA: Diagnosis not present

## 2023-02-05 DIAGNOSIS — E876 Hypokalemia: Secondary | ICD-10-CM | POA: Diagnosis not present

## 2023-02-05 DIAGNOSIS — I5032 Chronic diastolic (congestive) heart failure: Secondary | ICD-10-CM | POA: Diagnosis not present

## 2023-02-05 DIAGNOSIS — K219 Gastro-esophageal reflux disease without esophagitis: Secondary | ICD-10-CM | POA: Diagnosis not present

## 2023-02-05 DIAGNOSIS — M810 Age-related osteoporosis without current pathological fracture: Secondary | ICD-10-CM | POA: Diagnosis not present

## 2023-02-05 DIAGNOSIS — R911 Solitary pulmonary nodule: Secondary | ICD-10-CM | POA: Diagnosis not present

## 2023-02-05 DIAGNOSIS — E559 Vitamin D deficiency, unspecified: Secondary | ICD-10-CM | POA: Insufficient documentation

## 2023-02-05 DIAGNOSIS — G894 Chronic pain syndrome: Secondary | ICD-10-CM | POA: Diagnosis not present

## 2023-02-05 DIAGNOSIS — G4700 Insomnia, unspecified: Secondary | ICD-10-CM | POA: Diagnosis not present

## 2023-02-05 DIAGNOSIS — J4489 Other specified chronic obstructive pulmonary disease: Secondary | ICD-10-CM | POA: Diagnosis not present

## 2023-02-05 DIAGNOSIS — M179 Osteoarthritis of knee, unspecified: Secondary | ICD-10-CM | POA: Diagnosis not present

## 2023-02-05 DIAGNOSIS — Z8673 Personal history of transient ischemic attack (TIA), and cerebral infarction without residual deficits: Secondary | ICD-10-CM | POA: Diagnosis not present

## 2023-02-05 DIAGNOSIS — Q2733 Arteriovenous malformation of digestive system vessel: Secondary | ICD-10-CM | POA: Diagnosis not present

## 2023-02-05 NOTE — Telephone Encounter (Signed)
Spoke with patient daughter Rinaldo Cloud and advise of results. She verbalized understanding

## 2023-02-05 NOTE — Telephone Encounter (Signed)
Please let the patient/daughter know that the calcium is normal but vitamin D is low    Needs to start over the counter vitamin D 2000 international units a day

## 2023-02-06 ENCOUNTER — Other Ambulatory Visit: Payer: Self-pay | Admitting: Pulmonary Disease

## 2023-02-06 ENCOUNTER — Telehealth: Payer: Self-pay | Admitting: Pulmonary Disease

## 2023-02-06 ENCOUNTER — Other Ambulatory Visit: Payer: Self-pay | Admitting: Internal Medicine

## 2023-02-06 ENCOUNTER — Encounter: Payer: Self-pay | Admitting: Internal Medicine

## 2023-02-06 DIAGNOSIS — Q2733 Arteriovenous malformation of digestive system vessel: Secondary | ICD-10-CM | POA: Diagnosis not present

## 2023-02-06 DIAGNOSIS — G894 Chronic pain syndrome: Secondary | ICD-10-CM | POA: Diagnosis not present

## 2023-02-06 DIAGNOSIS — G4700 Insomnia, unspecified: Secondary | ICD-10-CM | POA: Diagnosis not present

## 2023-02-06 DIAGNOSIS — R350 Frequency of micturition: Secondary | ICD-10-CM | POA: Diagnosis not present

## 2023-02-06 DIAGNOSIS — M179 Osteoarthritis of knee, unspecified: Secondary | ICD-10-CM | POA: Diagnosis not present

## 2023-02-06 DIAGNOSIS — K921 Melena: Secondary | ICD-10-CM | POA: Diagnosis not present

## 2023-02-06 DIAGNOSIS — R911 Solitary pulmonary nodule: Secondary | ICD-10-CM | POA: Diagnosis not present

## 2023-02-06 DIAGNOSIS — M5412 Radiculopathy, cervical region: Secondary | ICD-10-CM | POA: Diagnosis not present

## 2023-02-06 DIAGNOSIS — Z8673 Personal history of transient ischemic attack (TIA), and cerebral infarction without residual deficits: Secondary | ICD-10-CM | POA: Diagnosis not present

## 2023-02-06 DIAGNOSIS — I110 Hypertensive heart disease with heart failure: Secondary | ICD-10-CM | POA: Diagnosis not present

## 2023-02-06 DIAGNOSIS — K219 Gastro-esophageal reflux disease without esophagitis: Secondary | ICD-10-CM | POA: Diagnosis not present

## 2023-02-06 DIAGNOSIS — E876 Hypokalemia: Secondary | ICD-10-CM | POA: Diagnosis not present

## 2023-02-06 DIAGNOSIS — I272 Pulmonary hypertension, unspecified: Secondary | ICD-10-CM | POA: Diagnosis not present

## 2023-02-06 DIAGNOSIS — J441 Chronic obstructive pulmonary disease with (acute) exacerbation: Secondary | ICD-10-CM | POA: Diagnosis not present

## 2023-02-06 DIAGNOSIS — I5032 Chronic diastolic (congestive) heart failure: Secondary | ICD-10-CM | POA: Diagnosis not present

## 2023-02-06 DIAGNOSIS — E785 Hyperlipidemia, unspecified: Secondary | ICD-10-CM | POA: Diagnosis not present

## 2023-02-06 DIAGNOSIS — K449 Diaphragmatic hernia without obstruction or gangrene: Secondary | ICD-10-CM | POA: Diagnosis not present

## 2023-02-06 DIAGNOSIS — Z87891 Personal history of nicotine dependence: Secondary | ICD-10-CM | POA: Diagnosis not present

## 2023-02-06 DIAGNOSIS — M810 Age-related osteoporosis without current pathological fracture: Secondary | ICD-10-CM | POA: Diagnosis not present

## 2023-02-06 DIAGNOSIS — J4489 Other specified chronic obstructive pulmonary disease: Secondary | ICD-10-CM | POA: Diagnosis not present

## 2023-02-06 DIAGNOSIS — D539 Nutritional anemia, unspecified: Secondary | ICD-10-CM | POA: Diagnosis not present

## 2023-02-06 DIAGNOSIS — J9621 Acute and chronic respiratory failure with hypoxia: Secondary | ICD-10-CM | POA: Diagnosis not present

## 2023-02-06 NOTE — Telephone Encounter (Signed)
PT's daughter calling for Trelegy samples to be left for her mom. Her # is [phone]  It is over 100.00 for her. I adv daughter no to come in but wait for call when samples can be ready.

## 2023-02-07 NOTE — Progress Notes (Unsigned)
02/07/2023 Wendy Torres [id number] [birth year]  Referring provider: Corwin Levins, MD Primary GI doctor: Dr. Meridee Score (Dr. Christella Hartigan)  ASSESSMENT AND PLAN:   IDA  EGD 09/19 in hospital for melena 2 small nonbleeding duodenal ulcers clean base, 4 nonbleeding AVMs 2016 colonoscopy excellent prep 1 4 mm TA polyp Declines rectal exam, will send FOBT home with dark stools, possibly from iron Recheck iron/CBC, consider IV iron but patient appears to be tolerating oral iron fairly well Continue Protonix 40 mg once daily indefinitely If patient continues to have iron deficiency despite iron use, or transfusion dependence anemia could consider capsule endoscopy -Monitor CBC/iron in 3 months  Chronic idiopathic constipation - Increase fiber/ water intake, decrease caffeine, increase activity level. -continue Miralax daily  Chronic respiratory failure with hypoxia (HCC) secondary to COPD On chronic 2-3 L [place] High risk for endoscopic procedures  History of tubular adenoma 2016 Colonoscopy excellent prep 1 tubular adenoma polyp 4 mm transverse colon, internal hemorrhoids, no recall secondary to age.   Patient Care Team: Corwin Levins, MD as PCP - General Lysle Rubens, OT as Occupational Therapist (Occupational Therapy) Rodrigo Ran, OD as Consulting Physician (Ophthalmology)  HISTORY OF PRESENT ILLNESS: 82 y.o. female with a past medical history of hypertension, COPD on 4 L home oxygen, former cigarette smoker, pulmonary hypertension, chronic HFpEF, anxiety, chronic pain syndrome, GERD, hiatal hernia, hyperlipidemia, and stroke/TIA and others listed below presents for evaluation of dysphagia.   2012 colonoscopy Dr. Jarold Motto unremarkable 2011 EGD with Dr. Jarold Motto normal except for occult stricture at GE junction dilated 2016 endoscopy and colonoscopy for IDA with Dr. Christella Hartigan EGD showed mild nonspecific distal gastritis, pathology negative H. pylori Colonoscopy excellent  prep 1 tubular adenoma polyp 4 mm transverse colon, internal hemorrhoids, no recall secondary to age. 01/2022 OV with myself for dysphagia, was intermittent with reflux, due to high risk endoscopic evaluation barium scheduled. 02/09/2022 barium swallow showed dilated and patulous esophagus with signs of mild to moderate dysmotility trace spontaneous reflux no stricture or mass.  01/18/2023 EGD Dr. Russella Dar for IDA, FOBt + stools during hospitlization.  Esophageal plaques suspicious for candidiasis, erythematous mucosa in antrum, 2 small nonbleeding duodenal ulcers clean base, 4 nonbleeding AVMs duodenum status post APC Path showed mild chronic inactive gastritis, neg H pylori, esophageal biopsies negative Hgb at baseline. Iron (10/2022) 34, ferritin 13.9. (10/2022) B12- 299.   Recent Labs    11/09/22 1447 12/19/22 2032 01/14/23 2342 01/15/23 0826 01/16/23 0700 01/17/23 0527 01/18/23 0536 01/29/23 1802  HGB 10.6* 10.5* 9.3* 9.0* 9.4* 10.2* 10.4* 9.4*   Daughter is with her, has 8 girls and 2 boys.  She is on an iron supplement once a day. She is having constipation, dark stools, similar to as when she went to the hospital. She has a BM once in the morning, well formed stools, feels complete BM.  No worsening SOB, no dizziness, no fatigue.  Since being seen she has had some worsening breathing, she is on protable O2 constant and in a wheelchair due to knee pain Getting work up for hypercalcemia, started on Vitamin D, PTH  elevated at 94    She  reports that she quit smoking about 50 years ago. Her smoking use included cigarettes. She started smoking about 75 years ago. She has a 12.5 pack-year smoking history. She has never used smokeless tobacco. She reports that she does not drink alcohol and does not use drugs.  Current Medications:   Current Outpatient Medications (Endocrine &  Metabolic):    predniSONE (DELTASONE) 10 MG tablet, Take 20mg  daily for 3days,Take 10mg  daily for 3days, then  stop (Patient not taking: Reported on 02/01/2023)  Current Outpatient Medications (Cardiovascular):    amLODipine (NORVASC) 10 MG tablet, Take 1 tablet (10 mg total) by mouth daily.   cloNIDine (CATAPRES) 0.1 MG tablet, TAKE 1 TABLET BY MOUTH TWICE A DAY   furosemide (LASIX) 20 MG tablet, TAKE 1 TABLET BY MOUTH IN THE  MORNING   hydrALAZINE (APRESOLINE) 100 MG tablet, Take 1 tablet (100 mg total) by mouth 3 (three) times daily.   losartan (COZAAR) 100 MG tablet, Take 1 tablet (100 mg total) by mouth daily.   lovastatin (MEVACOR) 40 MG tablet, Take 1 tablet (40 mg total) by mouth at bedtime.  Current Outpatient Medications (Respiratory):    albuterol (VENTOLIN HFA) 108 (90 Base) MCG/ACT inhaler, INHALE 2 PUFFS INTO THE LUNGS EVERY 4 HOURS AS NEEDED FOR WHEEZING OR SHORTNESS OF BREATH. (Patient taking differently: Inhale 2 puffs into the lungs as needed for wheezing or shortness of breath.)   Fexofenadine HCl (MUCINEX ALLERGY PO), Take 1 tablet by mouth as needed (congestion).   Fluticasone-Umeclidin-Vilant (TRELEGY ELLIPTA) 200-62.5-25 MCG/ACT AEPB, Inhale 1 puff into the lungs daily.   guaiFENesin (MUCINEX) 600 MG 12 hr tablet, Take 1 tablet (600 mg total) by mouth 2 (two) times daily.  Current Outpatient Medications (Analgesics):    acetaminophen (TYLENOL) 325 MG tablet, Take 2 tablets (650 mg total) by mouth every 6 (six) hours as needed for mild pain (or Fever >/= 101). (Patient taking differently: Take 650 mg by mouth every 6 (six) hours as needed for mild pain.)   aspirin EC 81 MG tablet, Take 1 tablet (81 mg total) by mouth daily. Swallow whole.   traMADol (ULTRAM) 50 MG tablet, Take 1 tablet (50 mg total) by mouth every 6 (six) hours as needed. for pain (Patient taking differently: Take 50 mg by mouth in the morning and at bedtime.)  Current Outpatient Medications (Hematological):    iron polysaccharides (NU-IRON) 150 MG capsule, Take 1 capsule (150 mg total) by mouth daily.  Current  Outpatient Medications (Other):    ALPRAZolam (XANAX) 0.25 MG tablet, TAKE 1 TABLET BY MOUTH TWICE A DAY AS NEEDED (Patient taking differently: Take 0.25 mg by mouth as needed for anxiety.)   fluocinolone (SYNALAR) 0.01 % external solution, APPLY TOPICALLY 2 TIMES DAILY AS NEEDED.   gabapentin (NEURONTIN) 600 MG tablet, Take 1 tablet (600 mg total) by mouth 3 (three) times daily.   meclizine (ANTIVERT) 12.5 MG tablet, TAKE 1 TABLET BY MOUTH 3 TIMES A DAY AS NEEDED FOR DIZZINESS (Patient taking differently: Take 12.5 mg by mouth 3 (three) times daily as needed for dizziness or nausea.)   nystatin (MYCOSTATIN) 100000 UNIT/ML suspension, Swish and spit 5mL two times daily after using Symbicort. (Patient taking differently: Use as directed 5 mLs in the mouth or throat in the morning and at bedtime. Swish and spit)   pantoprazole (PROTONIX) 40 MG tablet, TAKE 1 TABLET BY MOUTH DAILY   polyethylene glycol (MIRALAX / GLYCOLAX) 17 g packet, Take 17 g by mouth daily as needed for mild constipation or moderate constipation.   solifenacin (VESICARE) 10 MG tablet, Take 1 tablet (10 mg total) by mouth daily.   tiZANidine (ZANAFLEX) 4 MG tablet, TAKE 1 TABLET BY MOUTH EVERY 6 HOURS AS NEEDED FOR MUSCLE SPASMS. (Patient taking differently: Take 4 mg by mouth every 6 (six) hours as needed for muscle spasms.)  traZODone (DESYREL) 50 MG tablet, TAKE 1/2 TO 1 TABLET BY MOUTH AT BEDTIME AS NEEDED FOR SLEEP (Patient taking differently: Take 25-50 mg by mouth at bedtime as needed for sleep.)   triamcinolone cream (KENALOG) 0.1 %, APPLY TO AFFECTED AREA TWICE A DAY (Patient taking differently: Apply 1 Application topically as needed (eczema).)   Turmeric (QC TUMERIC COMPLEX PO), Take 1,000 mg by mouth daily.  Medical History:  Past Medical History:  Diagnosis Date   ABNORMAL CHEST XRAY 11/05/2008   ACUTE URIS OF UNSPECIFIED SITE 10/22/2007   ALLERGIC RHINITIS 12/09/2006   ANEMIA 01/14/2009   Anxiety    Arthritis     ASTHMA 12/09/2006   Asthma    Cataract    bilateral -removed   CHF (congestive heart failure) (HCC)    Chronic pain syndrome 02/16/2009   COPD 12/09/2006   DEGENERATIVE JOINT DISEASE 12/09/2006   DIZZINESS 03/30/2010   DYSPNEA 01/27/2010   with heavy exertion   FREQUENCY, URINARY 03/30/2010   GERD 12/09/2006   Glossitis 01/14/2009   HELICOBACTER PYLORI GASTRITIS, HX OF 12/09/2006   HIATAL HERNIA 03/01/2010   History of hiatal hernia    HYPERLIPIDEMIA 04/18/2007   HYPERSOMNIA 08/31/2008   HYPERTENSION 12/09/2006   INSOMNIA-SLEEP DISORDER-UNSPEC 11/05/2008   LOW BACK PAIN 12/09/2006   Morbid obesity (HCC) 12/09/2006   Nonspecific (abnormal) findings on radiological and other examination of body structure 11/05/2008   OSTEOPOROSIS 12/09/2006   OVERACTIVE BLADDER 04/14/2008   PALPITATIONS, RECURRENT 01/27/2010   Stroke (HCC)    pt. stated light one years ago   SYNCOPE 11/05/2008   TRANSIENT ISCHEMIC ATTACK, HX OF 12/09/2006   Allergies:  Allergies  Allergen Reactions   Chlorthalidone Shortness Of Breath     Surgical History:  She  has a past surgical history that includes Tubal ligation; Lumbar disc surgery; Colonoscopy; Tonsillectomy; Total hip arthroplasty (Right, 11/06/2012); Total hip arthroplasty (Left, 02/18/2014); Esophagogastroduodenoscopy (egd) with propofol (N/A, 01/18/2023); Hot hemostasis (N/A, 01/18/2023); and biopsy (01/18/2023). Family History:  Her family history includes Heart disease in her mother; Hypertension in her brother; Lung cancer in her father.  REVIEW OF SYSTEMS  : All other systems reviewed and negative except where noted in the History of Present Illness.  PHYSICAL EXAM: There were no vitals taken for this visit. General:   Pleasant, obese female in no acute distress Head:   Normocephalic and atraumatic. Eyes:  sclerae anicteric,conjunctive pink  Heart:   regular rate and rhythm Pulm:  Clear anteriorly; no wheezing, diffuse decreased breath  sounds, [place] in place on 3 L Abdomen:   Soft, Obese AB, Sluggish bowel sounds. No tenderness . No organomegaly appreciated. Rectal: declines Extremities:  With edema. Msk: Symmetrical without gross deformities.in wheelchair Neurologic:  Alert and  oriented x4;  No focal deficits.  Skin:   Dry and intact without significant lesions or rashes. Psychiatric:  Cooperative. Normal mood and affect.    Doree Albee, PA-C 1:37 PM

## 2023-02-08 ENCOUNTER — Ambulatory Visit: Payer: Medicare Other | Admitting: Physician Assistant

## 2023-02-08 ENCOUNTER — Encounter: Payer: Self-pay | Admitting: Physician Assistant

## 2023-02-08 ENCOUNTER — Other Ambulatory Visit: Payer: Medicare Other

## 2023-02-08 VITALS — BP 142/80 | HR 72 | Ht 64.0 in

## 2023-02-08 DIAGNOSIS — K2100 Gastro-esophageal reflux disease with esophagitis, without bleeding: Secondary | ICD-10-CM | POA: Diagnosis not present

## 2023-02-08 LAB — CBC WITH DIFFERENTIAL/PLATELET
Basophils Absolute: 0 10*3/uL (ref 0.0–0.1)
Basophils Relative: 0.8 % (ref 0.0–3.0)
Eosinophils Absolute: 0.2 10*3/uL (ref 0.0–0.7)
Eosinophils Relative: 2.6 % (ref 0.0–5.0)
HCT: 31.4 % — ABNORMAL LOW (ref 36.0–46.0)
Hemoglobin: 9.7 g/dL — ABNORMAL LOW (ref 12.0–15.0)
Lymphocytes Relative: 20.7 % (ref 12.0–46.0)
Lymphs Abs: 1.2 10*3/uL (ref 0.7–4.0)
MCHC: 30.9 g/dL (ref 30.0–36.0)
MCV: 80.7 fL (ref 78.0–100.0)
Monocytes Absolute: 0.7 10*3/uL (ref 0.1–1.0)
Monocytes Relative: 11.5 % (ref 3.0–12.0)
Neutro Abs: 3.7 10*3/uL (ref 1.4–7.7)
Neutrophils Relative %: 64.4 % (ref 43.0–77.0)
Platelets: 419 10*3/uL — ABNORMAL HIGH (ref 150.0–400.0)
RBC: 3.89 Mil/uL (ref 3.87–5.11)
RDW: 15.2 % (ref 11.5–15.5)
WBC: 5.8 10*3/uL (ref 4.0–10.5)

## 2023-02-08 LAB — IBC + FERRITIN
Ferritin: 17.7 ng/mL (ref 10.0–291.0)
Iron: 32 ug/dL — ABNORMAL LOW (ref 42–145)
Saturation Ratios: 8.2 % — ABNORMAL LOW (ref 20.0–50.0)
TIBC: 392 ug/dL (ref 250.0–450.0)
Transferrin: 280 mg/dL (ref 212.0–360.0)

## 2023-02-08 NOTE — Progress Notes (Signed)
Attending Physician's Attestation   I have reviewed the chart.   I agree with the Advanced Practitioner's note, impression, and recommendations with any updates as below. Will see how the blood counts look in the iron indices and the FOBT.  Can still consider endoscopic reevaluation if needed versus proceeding with video capsule endoscopy.  However since it has been so long since her colonoscopy, we could be missing things from that perspective 2.  If she is adamant about not undergoing a colonoscopy then at least a video capsule endoscopy will need to be considered, in that particular circumstance.   Corliss Parish, MD [facility] Advanced Endoscopy Office # [phone]

## 2023-02-08 NOTE — Patient Instructions (Addendum)
Your provider has requested that you go to the basement level for lab work before leaving today. Press "B" on the elevator. The lab is located at the first door on the left as you exit the elevator.   We have scheduled you for a follow up on 05/13/2022 at 11:00am with amanda collier   Please take your proton pump inhibitor medication, pantoprazole stay on it daily  Please take this medication 30 minutes to 1 hour before meals- this makes it more effective.  Avoid spicy and acidic foods Avoid fatty foods Limit your intake of coffee, tea, alcohol, and carbonated drinks Work to maintain a healthy weight Keep the head of the bed elevated at least 3 inches with blocks or a wedge pillow if you are having any nighttime symptoms Stay upright for 2 hours after eating Avoid meals and snacks three to four hours before bedtime  Miralax is an osmotic laxative.  It only brings more water into the stool.  This is safe to take daily.  Can take up to 17 gram of miralax twice a day.  Mix with juice or coffee.  Start 1 capful at night for 3-4 days and reassess your response in 3-4 days.  You can increase and decrease the dose based on your response.  Remember, it can take up to 3-4 days to take effect OR for the effects to wear off.   I often pair this with benefiber in the morning to help assure the stool is not too loose.   Continue oral iron for now  _______________________________________________________  If your blood pressure at your visit was 140/90 or greater, please contact your primary care physician to follow up on this.  _______________________________________________________  If you are age 2 or older, your body mass index should be between 23-30. Your Body mass index is 35.02 kg/m. If this is out of the aforementioned range listed, please consider follow up with your Primary Care Provider.  If you are age 69 or younger, your body mass index should be between 19-25. Your Body mass  index is 35.02 kg/m. If this is out of the aformentioned range listed, please consider follow up with your Primary Care Provider.   ________________________________________________________  The [facility] providers would like to encourage you to use [facility] to communicate with providers for non-urgent requests or questions.  Due to long hold times on the telephone, sending your provider a message by [facility] may be a faster and more efficient way to get a response.  Please allow 48 business hours for a response.  Please remember that this is for non-urgent requests.  _______________________________________________________ It was a pleasure to see you today!  Thank you for trusting me with your gastrointestinal care!

## 2023-02-09 ENCOUNTER — Other Ambulatory Visit: Payer: Self-pay

## 2023-02-09 ENCOUNTER — Telehealth: Payer: Self-pay | Admitting: Internal Medicine

## 2023-02-09 DIAGNOSIS — M810 Age-related osteoporosis without current pathological fracture: Secondary | ICD-10-CM | POA: Diagnosis not present

## 2023-02-09 DIAGNOSIS — E876 Hypokalemia: Secondary | ICD-10-CM | POA: Diagnosis not present

## 2023-02-09 DIAGNOSIS — Z87891 Personal history of nicotine dependence: Secondary | ICD-10-CM | POA: Diagnosis not present

## 2023-02-09 DIAGNOSIS — D539 Nutritional anemia, unspecified: Secondary | ICD-10-CM | POA: Diagnosis not present

## 2023-02-09 DIAGNOSIS — I272 Pulmonary hypertension, unspecified: Secondary | ICD-10-CM | POA: Diagnosis not present

## 2023-02-09 DIAGNOSIS — G4700 Insomnia, unspecified: Secondary | ICD-10-CM | POA: Diagnosis not present

## 2023-02-09 DIAGNOSIS — J9621 Acute and chronic respiratory failure with hypoxia: Secondary | ICD-10-CM | POA: Diagnosis not present

## 2023-02-09 DIAGNOSIS — M5412 Radiculopathy, cervical region: Secondary | ICD-10-CM | POA: Diagnosis not present

## 2023-02-09 DIAGNOSIS — M179 Osteoarthritis of knee, unspecified: Secondary | ICD-10-CM | POA: Diagnosis not present

## 2023-02-09 DIAGNOSIS — J4489 Other specified chronic obstructive pulmonary disease: Secondary | ICD-10-CM | POA: Diagnosis not present

## 2023-02-09 DIAGNOSIS — K449 Diaphragmatic hernia without obstruction or gangrene: Secondary | ICD-10-CM | POA: Diagnosis not present

## 2023-02-09 DIAGNOSIS — E785 Hyperlipidemia, unspecified: Secondary | ICD-10-CM | POA: Diagnosis not present

## 2023-02-09 DIAGNOSIS — I110 Hypertensive heart disease with heart failure: Secondary | ICD-10-CM | POA: Diagnosis not present

## 2023-02-09 DIAGNOSIS — G894 Chronic pain syndrome: Secondary | ICD-10-CM | POA: Diagnosis not present

## 2023-02-09 DIAGNOSIS — K219 Gastro-esophageal reflux disease without esophagitis: Secondary | ICD-10-CM | POA: Diagnosis not present

## 2023-02-09 DIAGNOSIS — Z8673 Personal history of transient ischemic attack (TIA), and cerebral infarction without residual deficits: Secondary | ICD-10-CM | POA: Diagnosis not present

## 2023-02-09 DIAGNOSIS — D509 Iron deficiency anemia, unspecified: Secondary | ICD-10-CM

## 2023-02-09 DIAGNOSIS — K921 Melena: Secondary | ICD-10-CM | POA: Diagnosis not present

## 2023-02-09 DIAGNOSIS — R911 Solitary pulmonary nodule: Secondary | ICD-10-CM | POA: Diagnosis not present

## 2023-02-09 DIAGNOSIS — J441 Chronic obstructive pulmonary disease with (acute) exacerbation: Secondary | ICD-10-CM | POA: Diagnosis not present

## 2023-02-09 DIAGNOSIS — R350 Frequency of micturition: Secondary | ICD-10-CM | POA: Diagnosis not present

## 2023-02-09 DIAGNOSIS — I5032 Chronic diastolic (congestive) heart failure: Secondary | ICD-10-CM | POA: Diagnosis not present

## 2023-02-09 DIAGNOSIS — Q2733 Arteriovenous malformation of digestive system vessel: Secondary | ICD-10-CM | POA: Diagnosis not present

## 2023-02-09 NOTE — Telephone Encounter (Signed)
[facility] called and said they received the orders for a commode and nebulizer for the patient. They said they still need more information.  The order for the nebulizer needs supplies and quantity stated, as well as the medication needed for it. They also need office visit notes supporting the need for the nebulizer.  The order for the commode needs patient's height and weight. They also need the reasoning why the patient needs the commode.  Their fax number is [phone].  Best callback is [phone].

## 2023-02-09 NOTE — Telephone Encounter (Signed)
Ok to take the lasix at 40 mg per day for 3 days, then return to 20 mg per day please   thanks

## 2023-02-09 NOTE — Telephone Encounter (Signed)
Called and unable to leave voicemail

## 2023-02-09 NOTE — Telephone Encounter (Signed)
Ok to fax last OV  - oct 2 for all concerns

## 2023-02-09 NOTE — Telephone Encounter (Signed)
Patient has gained approximately 4 pounds of fluid weight in the last day or so. They would like to know if their furosemide (LASIX) 20 MG tablet dosage should be increased. Patient would like a call back at [phone].

## 2023-02-12 ENCOUNTER — Telehealth: Payer: Self-pay | Admitting: Internal Medicine

## 2023-02-12 DIAGNOSIS — I5032 Chronic diastolic (congestive) heart failure: Secondary | ICD-10-CM | POA: Diagnosis not present

## 2023-02-12 DIAGNOSIS — D539 Nutritional anemia, unspecified: Secondary | ICD-10-CM | POA: Diagnosis not present

## 2023-02-12 DIAGNOSIS — K219 Gastro-esophageal reflux disease without esophagitis: Secondary | ICD-10-CM | POA: Diagnosis not present

## 2023-02-12 DIAGNOSIS — K921 Melena: Secondary | ICD-10-CM | POA: Diagnosis not present

## 2023-02-12 DIAGNOSIS — M5412 Radiculopathy, cervical region: Secondary | ICD-10-CM | POA: Diagnosis not present

## 2023-02-12 DIAGNOSIS — Z8673 Personal history of transient ischemic attack (TIA), and cerebral infarction without residual deficits: Secondary | ICD-10-CM | POA: Diagnosis not present

## 2023-02-12 DIAGNOSIS — J9621 Acute and chronic respiratory failure with hypoxia: Secondary | ICD-10-CM | POA: Diagnosis not present

## 2023-02-12 DIAGNOSIS — I110 Hypertensive heart disease with heart failure: Secondary | ICD-10-CM | POA: Diagnosis not present

## 2023-02-12 DIAGNOSIS — M179 Osteoarthritis of knee, unspecified: Secondary | ICD-10-CM | POA: Diagnosis not present

## 2023-02-12 DIAGNOSIS — R911 Solitary pulmonary nodule: Secondary | ICD-10-CM | POA: Diagnosis not present

## 2023-02-12 DIAGNOSIS — Q2733 Arteriovenous malformation of digestive system vessel: Secondary | ICD-10-CM | POA: Diagnosis not present

## 2023-02-12 DIAGNOSIS — G894 Chronic pain syndrome: Secondary | ICD-10-CM | POA: Diagnosis not present

## 2023-02-12 DIAGNOSIS — I272 Pulmonary hypertension, unspecified: Secondary | ICD-10-CM | POA: Diagnosis not present

## 2023-02-12 DIAGNOSIS — J441 Chronic obstructive pulmonary disease with (acute) exacerbation: Secondary | ICD-10-CM | POA: Diagnosis not present

## 2023-02-12 DIAGNOSIS — R350 Frequency of micturition: Secondary | ICD-10-CM | POA: Diagnosis not present

## 2023-02-12 DIAGNOSIS — J4489 Other specified chronic obstructive pulmonary disease: Secondary | ICD-10-CM | POA: Diagnosis not present

## 2023-02-12 DIAGNOSIS — Z87891 Personal history of nicotine dependence: Secondary | ICD-10-CM | POA: Diagnosis not present

## 2023-02-12 DIAGNOSIS — G4700 Insomnia, unspecified: Secondary | ICD-10-CM | POA: Diagnosis not present

## 2023-02-12 DIAGNOSIS — M810 Age-related osteoporosis without current pathological fracture: Secondary | ICD-10-CM | POA: Diagnosis not present

## 2023-02-12 DIAGNOSIS — E785 Hyperlipidemia, unspecified: Secondary | ICD-10-CM | POA: Diagnosis not present

## 2023-02-12 DIAGNOSIS — E876 Hypokalemia: Secondary | ICD-10-CM | POA: Diagnosis not present

## 2023-02-12 DIAGNOSIS — K449 Diaphragmatic hernia without obstruction or gangrene: Secondary | ICD-10-CM | POA: Diagnosis not present

## 2023-02-12 MED ORDER — CLONIDINE HCL 0.2 MG PO TABS: 0.2000 mg | ORAL_TABLET | Freq: Two times a day (BID) | ORAL | 3 refills | Status: DC

## 2023-02-12 NOTE — Telephone Encounter (Signed)
Ok for increased clonidine 0.2 mg twice per day - I sent new rx

## 2023-02-12 NOTE — Telephone Encounter (Signed)
Forms faxed

## 2023-02-12 NOTE — Telephone Encounter (Signed)
Called and let Pt daughter know.

## 2023-02-12 NOTE — Telephone Encounter (Signed)
Erin, physical therapist with [facility], called states she took the patients vitals and her BP was 180/60 and states family has been monitoring her BP as well and it ranges from 160-180 for the last few days. She also has been having intermediate headaches as well. Patient was just seen in office last week. She would like for [place] to call her daughter Lady Gary [phone] with any updates she may need to do.

## 2023-02-13 ENCOUNTER — Other Ambulatory Visit (HOSPITAL_COMMUNITY): Payer: Self-pay

## 2023-02-13 ENCOUNTER — Telehealth: Payer: Self-pay | Admitting: Internal Medicine

## 2023-02-13 DIAGNOSIS — J4489 Other specified chronic obstructive pulmonary disease: Secondary | ICD-10-CM

## 2023-02-13 DIAGNOSIS — J439 Emphysema, unspecified: Secondary | ICD-10-CM

## 2023-02-13 DIAGNOSIS — J441 Chronic obstructive pulmonary disease with (acute) exacerbation: Secondary | ICD-10-CM

## 2023-02-13 DIAGNOSIS — I4891 Unspecified atrial fibrillation: Secondary | ICD-10-CM

## 2023-02-13 DIAGNOSIS — I5032 Chronic diastolic (congestive) heart failure: Secondary | ICD-10-CM

## 2023-02-13 NOTE — Telephone Encounter (Signed)
[facility] called - her condition supports the nebulizer use but they will need an order for all the supplies - mask, tubing, refills, quantity, etc.  Order did not support the need for a commode - more information will be needed - patient must be room confined and unable to reach proper facilities.  Please call:  [phone]  Fax #  [phone]

## 2023-02-13 NOTE — Telephone Encounter (Signed)
Ok for re do [facility] hardcopy order - done to cma  I dont know how to order the supplies et al that they want for the Neb machine as I dont normally prescribe this  I can refer to pulm for further consideration, or I can sign an order if sent to me    thanks

## 2023-02-14 ENCOUNTER — Telehealth: Payer: Self-pay | Admitting: Hematology and Oncology

## 2023-02-14 NOTE — Telephone Encounter (Signed)
Spoke with daughter. A month worth of samples up front for pick up [phone]. Lot [id number]. Exp 05/2024.

## 2023-02-14 NOTE — Telephone Encounter (Signed)
Patient with iron deficiency anemia EGD in the hospital showed duodenal ulcers with clean base for nonbleeding AVM status post APC last colonoscopy 2016 with 1 TA polyp 02/08/2023  HGB 9.7 MCV 80.7 Platelets 419.0 02/08/2023 Iron 32 Ferritin 17.7 B12 299 Patient continues to have iron deficiency despite oral iron. After discussion with daughter and patient feels she would be unable to follow through with a colonoscopy at this time will schedule for capsule endoscopy to evaluate for iron deficiency anemia.

## 2023-02-14 NOTE — Telephone Encounter (Signed)
Scheduled appointments per referral. Patient is aware of the appointment time and date as well as the address. Patient was informed to arrive 10-15 minutes prior with updated insurance information. All questions were answered.

## 2023-02-14 NOTE — Telephone Encounter (Signed)
She has been notified that it is at [facility], address and hours given.

## 2023-02-14 NOTE — Telephone Encounter (Signed)
Order faxed.

## 2023-02-15 ENCOUNTER — Ambulatory Visit
Admission: RE | Admit: 2023-02-15 | Discharge: 2023-02-15 | Disposition: A | Discharge status: Home / Self Care | Payer: Medicare Other | Source: Ambulatory Visit | Attending: Internal Medicine | Admitting: Internal Medicine

## 2023-02-15 ENCOUNTER — Other Ambulatory Visit: Payer: Self-pay

## 2023-02-15 ENCOUNTER — Ambulatory Visit: Payer: Medicare Other

## 2023-02-15 DIAGNOSIS — G894 Chronic pain syndrome: Secondary | ICD-10-CM | POA: Diagnosis not present

## 2023-02-15 DIAGNOSIS — E785 Hyperlipidemia, unspecified: Secondary | ICD-10-CM | POA: Diagnosis not present

## 2023-02-15 DIAGNOSIS — R911 Solitary pulmonary nodule: Secondary | ICD-10-CM | POA: Diagnosis not present

## 2023-02-15 DIAGNOSIS — M179 Osteoarthritis of knee, unspecified: Secondary | ICD-10-CM | POA: Diagnosis not present

## 2023-02-15 DIAGNOSIS — K219 Gastro-esophageal reflux disease without esophagitis: Secondary | ICD-10-CM | POA: Diagnosis not present

## 2023-02-15 DIAGNOSIS — Z8673 Personal history of transient ischemic attack (TIA), and cerebral infarction without residual deficits: Secondary | ICD-10-CM | POA: Diagnosis not present

## 2023-02-15 DIAGNOSIS — J439 Emphysema, unspecified: Secondary | ICD-10-CM | POA: Diagnosis not present

## 2023-02-15 DIAGNOSIS — I272 Pulmonary hypertension, unspecified: Secondary | ICD-10-CM | POA: Diagnosis not present

## 2023-02-15 DIAGNOSIS — I517 Cardiomegaly: Secondary | ICD-10-CM | POA: Diagnosis not present

## 2023-02-15 DIAGNOSIS — K2100 Gastro-esophageal reflux disease with esophagitis, without bleeding: Secondary | ICD-10-CM | POA: Diagnosis not present

## 2023-02-15 DIAGNOSIS — K449 Diaphragmatic hernia without obstruction or gangrene: Secondary | ICD-10-CM | POA: Diagnosis not present

## 2023-02-15 DIAGNOSIS — M5412 Radiculopathy, cervical region: Secondary | ICD-10-CM | POA: Diagnosis not present

## 2023-02-15 DIAGNOSIS — J9811 Atelectasis: Secondary | ICD-10-CM | POA: Diagnosis not present

## 2023-02-15 DIAGNOSIS — I110 Hypertensive heart disease with heart failure: Secondary | ICD-10-CM | POA: Diagnosis not present

## 2023-02-15 DIAGNOSIS — Z87891 Personal history of nicotine dependence: Secondary | ICD-10-CM | POA: Diagnosis not present

## 2023-02-15 DIAGNOSIS — Q2733 Arteriovenous malformation of digestive system vessel: Secondary | ICD-10-CM | POA: Diagnosis not present

## 2023-02-15 DIAGNOSIS — R350 Frequency of micturition: Secondary | ICD-10-CM | POA: Diagnosis not present

## 2023-02-15 DIAGNOSIS — I5032 Chronic diastolic (congestive) heart failure: Secondary | ICD-10-CM | POA: Diagnosis not present

## 2023-02-15 DIAGNOSIS — K921 Melena: Secondary | ICD-10-CM | POA: Diagnosis not present

## 2023-02-15 DIAGNOSIS — D539 Nutritional anemia, unspecified: Secondary | ICD-10-CM | POA: Diagnosis not present

## 2023-02-15 DIAGNOSIS — J9621 Acute and chronic respiratory failure with hypoxia: Secondary | ICD-10-CM | POA: Diagnosis not present

## 2023-02-15 DIAGNOSIS — E876 Hypokalemia: Secondary | ICD-10-CM | POA: Diagnosis not present

## 2023-02-15 DIAGNOSIS — J441 Chronic obstructive pulmonary disease with (acute) exacerbation: Secondary | ICD-10-CM | POA: Diagnosis not present

## 2023-02-15 DIAGNOSIS — G4700 Insomnia, unspecified: Secondary | ICD-10-CM | POA: Diagnosis not present

## 2023-02-15 DIAGNOSIS — J4489 Other specified chronic obstructive pulmonary disease: Secondary | ICD-10-CM | POA: Diagnosis not present

## 2023-02-15 DIAGNOSIS — I700 Atherosclerosis of aorta: Secondary | ICD-10-CM | POA: Diagnosis not present

## 2023-02-15 DIAGNOSIS — M810 Age-related osteoporosis without current pathological fracture: Secondary | ICD-10-CM | POA: Diagnosis not present

## 2023-02-15 LAB — HEMOCCULT SLIDES (X 3 CARDS)
Fecal Occult Blood: NEGATIVE
OCCULT 1: NEGATIVE
OCCULT 2: NEGATIVE
OCCULT 3: NEGATIVE
OCCULT 4: NEGATIVE
OCCULT 5: NEGATIVE

## 2023-02-15 NOTE — Telephone Encounter (Signed)
Discussed scheduling VCE with pts daughter, she is concerned her mother will not be able to do the prep. Reports she is weaker and needs help to get around and with toileting. Sent the instructions to pts mychart and daughter is going to review this with her mother and they will let us know if they want to proceed with scheduling the procedure.

## 2023-02-15 NOTE — Telephone Encounter (Signed)
Left message for pts daughter to call back to schedule VCE for pt.

## 2023-02-15 NOTE — Telephone Encounter (Signed)
Ok I tried to addend the last visit - ok to fax thanks

## 2023-02-15 NOTE — Telephone Encounter (Signed)
Samples have been picked up by patients daughter Wendy Torres on 10/17 at 2:45pm.

## 2023-02-15 NOTE — Telephone Encounter (Signed)
[facility] called and said that the order needs to not be on a prescription it needs to be on a face to face note. This must be in the chart notes, not on the RX.

## 2023-02-16 DIAGNOSIS — R350 Frequency of micturition: Secondary | ICD-10-CM | POA: Diagnosis not present

## 2023-02-16 DIAGNOSIS — M179 Osteoarthritis of knee, unspecified: Secondary | ICD-10-CM | POA: Diagnosis not present

## 2023-02-16 DIAGNOSIS — R911 Solitary pulmonary nodule: Secondary | ICD-10-CM | POA: Diagnosis not present

## 2023-02-16 DIAGNOSIS — E876 Hypokalemia: Secondary | ICD-10-CM | POA: Diagnosis not present

## 2023-02-16 DIAGNOSIS — K449 Diaphragmatic hernia without obstruction or gangrene: Secondary | ICD-10-CM | POA: Diagnosis not present

## 2023-02-16 DIAGNOSIS — Z8673 Personal history of transient ischemic attack (TIA), and cerebral infarction without residual deficits: Secondary | ICD-10-CM | POA: Diagnosis not present

## 2023-02-16 DIAGNOSIS — K921 Melena: Secondary | ICD-10-CM | POA: Diagnosis not present

## 2023-02-16 DIAGNOSIS — K219 Gastro-esophageal reflux disease without esophagitis: Secondary | ICD-10-CM | POA: Diagnosis not present

## 2023-02-16 DIAGNOSIS — I5032 Chronic diastolic (congestive) heart failure: Secondary | ICD-10-CM | POA: Diagnosis not present

## 2023-02-16 DIAGNOSIS — J441 Chronic obstructive pulmonary disease with (acute) exacerbation: Secondary | ICD-10-CM | POA: Diagnosis not present

## 2023-02-16 DIAGNOSIS — I110 Hypertensive heart disease with heart failure: Secondary | ICD-10-CM | POA: Diagnosis not present

## 2023-02-16 DIAGNOSIS — Z87891 Personal history of nicotine dependence: Secondary | ICD-10-CM | POA: Diagnosis not present

## 2023-02-16 DIAGNOSIS — Q2733 Arteriovenous malformation of digestive system vessel: Secondary | ICD-10-CM | POA: Diagnosis not present

## 2023-02-16 DIAGNOSIS — G894 Chronic pain syndrome: Secondary | ICD-10-CM | POA: Diagnosis not present

## 2023-02-16 DIAGNOSIS — D539 Nutritional anemia, unspecified: Secondary | ICD-10-CM | POA: Diagnosis not present

## 2023-02-16 DIAGNOSIS — M810 Age-related osteoporosis without current pathological fracture: Secondary | ICD-10-CM | POA: Diagnosis not present

## 2023-02-16 DIAGNOSIS — J9621 Acute and chronic respiratory failure with hypoxia: Secondary | ICD-10-CM | POA: Diagnosis not present

## 2023-02-16 DIAGNOSIS — E785 Hyperlipidemia, unspecified: Secondary | ICD-10-CM | POA: Diagnosis not present

## 2023-02-16 DIAGNOSIS — G4700 Insomnia, unspecified: Secondary | ICD-10-CM | POA: Diagnosis not present

## 2023-02-16 DIAGNOSIS — I272 Pulmonary hypertension, unspecified: Secondary | ICD-10-CM | POA: Diagnosis not present

## 2023-02-16 DIAGNOSIS — M5412 Radiculopathy, cervical region: Secondary | ICD-10-CM | POA: Diagnosis not present

## 2023-02-16 DIAGNOSIS — J4489 Other specified chronic obstructive pulmonary disease: Secondary | ICD-10-CM | POA: Diagnosis not present

## 2023-02-16 NOTE — Telephone Encounter (Signed)
Notes faxed.

## 2023-02-19 DIAGNOSIS — I272 Pulmonary hypertension, unspecified: Secondary | ICD-10-CM | POA: Diagnosis not present

## 2023-02-19 DIAGNOSIS — Z87891 Personal history of nicotine dependence: Secondary | ICD-10-CM | POA: Diagnosis not present

## 2023-02-19 DIAGNOSIS — J441 Chronic obstructive pulmonary disease with (acute) exacerbation: Secondary | ICD-10-CM | POA: Diagnosis not present

## 2023-02-19 DIAGNOSIS — G4700 Insomnia, unspecified: Secondary | ICD-10-CM | POA: Diagnosis not present

## 2023-02-19 DIAGNOSIS — I110 Hypertensive heart disease with heart failure: Secondary | ICD-10-CM | POA: Diagnosis not present

## 2023-02-19 DIAGNOSIS — R911 Solitary pulmonary nodule: Secondary | ICD-10-CM | POA: Diagnosis not present

## 2023-02-19 DIAGNOSIS — R350 Frequency of micturition: Secondary | ICD-10-CM | POA: Diagnosis not present

## 2023-02-19 DIAGNOSIS — M810 Age-related osteoporosis without current pathological fracture: Secondary | ICD-10-CM | POA: Diagnosis not present

## 2023-02-19 DIAGNOSIS — K449 Diaphragmatic hernia without obstruction or gangrene: Secondary | ICD-10-CM | POA: Diagnosis not present

## 2023-02-19 DIAGNOSIS — G894 Chronic pain syndrome: Secondary | ICD-10-CM | POA: Diagnosis not present

## 2023-02-19 DIAGNOSIS — M179 Osteoarthritis of knee, unspecified: Secondary | ICD-10-CM | POA: Diagnosis not present

## 2023-02-19 DIAGNOSIS — Q2733 Arteriovenous malformation of digestive system vessel: Secondary | ICD-10-CM | POA: Diagnosis not present

## 2023-02-19 DIAGNOSIS — Z8673 Personal history of transient ischemic attack (TIA), and cerebral infarction without residual deficits: Secondary | ICD-10-CM | POA: Diagnosis not present

## 2023-02-19 DIAGNOSIS — E785 Hyperlipidemia, unspecified: Secondary | ICD-10-CM | POA: Diagnosis not present

## 2023-02-19 DIAGNOSIS — J9621 Acute and chronic respiratory failure with hypoxia: Secondary | ICD-10-CM | POA: Diagnosis not present

## 2023-02-19 DIAGNOSIS — I5032 Chronic diastolic (congestive) heart failure: Secondary | ICD-10-CM | POA: Diagnosis not present

## 2023-02-19 DIAGNOSIS — J4489 Other specified chronic obstructive pulmonary disease: Secondary | ICD-10-CM | POA: Diagnosis not present

## 2023-02-19 DIAGNOSIS — K921 Melena: Secondary | ICD-10-CM | POA: Diagnosis not present

## 2023-02-19 DIAGNOSIS — M5412 Radiculopathy, cervical region: Secondary | ICD-10-CM | POA: Diagnosis not present

## 2023-02-19 DIAGNOSIS — D539 Nutritional anemia, unspecified: Secondary | ICD-10-CM | POA: Diagnosis not present

## 2023-02-19 DIAGNOSIS — E876 Hypokalemia: Secondary | ICD-10-CM | POA: Diagnosis not present

## 2023-02-19 DIAGNOSIS — K219 Gastro-esophageal reflux disease without esophagitis: Secondary | ICD-10-CM | POA: Diagnosis not present

## 2023-02-21 DIAGNOSIS — K921 Melena: Secondary | ICD-10-CM | POA: Diagnosis not present

## 2023-02-21 DIAGNOSIS — G894 Chronic pain syndrome: Secondary | ICD-10-CM | POA: Diagnosis not present

## 2023-02-21 DIAGNOSIS — D539 Nutritional anemia, unspecified: Secondary | ICD-10-CM | POA: Diagnosis not present

## 2023-02-21 DIAGNOSIS — R350 Frequency of micturition: Secondary | ICD-10-CM | POA: Diagnosis not present

## 2023-02-21 DIAGNOSIS — I110 Hypertensive heart disease with heart failure: Secondary | ICD-10-CM | POA: Diagnosis not present

## 2023-02-21 DIAGNOSIS — I5032 Chronic diastolic (congestive) heart failure: Secondary | ICD-10-CM | POA: Diagnosis not present

## 2023-02-21 DIAGNOSIS — J9621 Acute and chronic respiratory failure with hypoxia: Secondary | ICD-10-CM | POA: Diagnosis not present

## 2023-02-21 DIAGNOSIS — J441 Chronic obstructive pulmonary disease with (acute) exacerbation: Secondary | ICD-10-CM | POA: Diagnosis not present

## 2023-02-21 DIAGNOSIS — Q2733 Arteriovenous malformation of digestive system vessel: Secondary | ICD-10-CM | POA: Diagnosis not present

## 2023-02-21 DIAGNOSIS — G4700 Insomnia, unspecified: Secondary | ICD-10-CM | POA: Diagnosis not present

## 2023-02-21 DIAGNOSIS — K219 Gastro-esophageal reflux disease without esophagitis: Secondary | ICD-10-CM | POA: Diagnosis not present

## 2023-02-21 DIAGNOSIS — I272 Pulmonary hypertension, unspecified: Secondary | ICD-10-CM | POA: Diagnosis not present

## 2023-02-21 DIAGNOSIS — E876 Hypokalemia: Secondary | ICD-10-CM | POA: Diagnosis not present

## 2023-02-21 DIAGNOSIS — Z87891 Personal history of nicotine dependence: Secondary | ICD-10-CM | POA: Diagnosis not present

## 2023-02-21 DIAGNOSIS — M179 Osteoarthritis of knee, unspecified: Secondary | ICD-10-CM | POA: Diagnosis not present

## 2023-02-21 DIAGNOSIS — R911 Solitary pulmonary nodule: Secondary | ICD-10-CM | POA: Diagnosis not present

## 2023-02-21 DIAGNOSIS — M5412 Radiculopathy, cervical region: Secondary | ICD-10-CM | POA: Diagnosis not present

## 2023-02-21 DIAGNOSIS — J4489 Other specified chronic obstructive pulmonary disease: Secondary | ICD-10-CM | POA: Diagnosis not present

## 2023-02-21 DIAGNOSIS — M810 Age-related osteoporosis without current pathological fracture: Secondary | ICD-10-CM | POA: Diagnosis not present

## 2023-02-21 DIAGNOSIS — K449 Diaphragmatic hernia without obstruction or gangrene: Secondary | ICD-10-CM | POA: Diagnosis not present

## 2023-02-21 DIAGNOSIS — E785 Hyperlipidemia, unspecified: Secondary | ICD-10-CM | POA: Diagnosis not present

## 2023-02-21 DIAGNOSIS — Z8673 Personal history of transient ischemic attack (TIA), and cerebral infarction without residual deficits: Secondary | ICD-10-CM | POA: Diagnosis not present

## 2023-02-22 ENCOUNTER — Ambulatory Visit: Payer: Medicare Other | Admitting: Pulmonary Disease

## 2023-02-22 ENCOUNTER — Encounter: Payer: Self-pay | Admitting: Pulmonary Disease

## 2023-02-22 VITALS — BP 160/66 | HR 83 | Ht 64.0 in | Wt 205.4 lb

## 2023-02-22 DIAGNOSIS — J439 Emphysema, unspecified: Secondary | ICD-10-CM

## 2023-02-22 DIAGNOSIS — J4489 Other specified chronic obstructive pulmonary disease: Secondary | ICD-10-CM | POA: Diagnosis not present

## 2023-02-22 NOTE — Patient Instructions (Addendum)
VISIT SUMMARY:  Wendy Torres, during your visit today, we discussed your worsening shortness of breath and reviewed your recent CT scan results. Your oxygen levels drop significantly with minimal activity, but your CT scan shows no signs of pneumonia and stable scarring in your lungs. We also discussed your pulmonary hypertension and the need for further evaluation by your cardiologist.  YOUR PLAN:  -CHRONIC OBSTRUCTIVE PULMONARY DISEASE (COPD): COPD is a chronic lung condition that makes it hard to breathe. Despite being on 4 liters of supplemental oxygen, your oxygen levels drop with minimal exertion. We will continue your current treatment with the Trelegy inhaler and supplemental oxygen at 4 liters.  -PULMONARY HYPERTENSION: Pulmonary hypertension is high blood pressure in the lungs' arteries, often caused by COPD. This condition may be contributing to your shortness of breath. We will contact your cardiologist, Dr. Tawanna Cooler, to order a repeat echocardiogram to reassess your condition.  INSTRUCTIONS:  Please follow up with your cardiologist, Dr. Servando Salina, for a repeat echocardiogram to reassess your pulmonary hypertension and cardiac function. We will see you again in 3-4 months for a follow-up visit.

## 2023-02-22 NOTE — Progress Notes (Signed)
Wendy Torres    [id number]    08/26/1940  Primary Care Physician:John, Len Blalock, MD  Referring Physician: Corwin Levins, MD [address] [place],  [place] [address]  Chief complaint: Follow-up for COPD  HPI: 82 y.o.  with history of emphysema, hypertension, diastolic heart failure.  Referred here for evaluation of emphysema Complaints of dyspnea with activity, no symptoms at rest.  Denies any cough, sputum production, wheezing She was hospitalized in April 2018 for COPD exacerbation, CHF exacerbation and has been on supplemental oxygen since then Symbicort changed to [facility] in 2019 and she is doing well with this change.  Then she got back to Symbicort due to insurance reasons  Pets: No pets  Occupation: Homemaker Exposures: No known exposures.  Reports a mildew problem in the basement but she hardly goes there Smoking history: 25-pack-year smoker.  Quit in 1982 Travel history: No significant travel Relevant family history: Father had emphysema  Interim history: Discussed the use of AI scribe software for clinical note transcription with the patient, who gave verbal consent to proceed.    Wendy Torres, a patient with a history of COPD and pulmonary hypertension, presents with worsening shortness of breath. Her daughter reports that her oxygen saturation drops significantly with minimal exertion, such as moving from the commode to her seat or walking to the bedroom. Despite being on 4 liters of supplemental oxygen, her saturation can drop to as low as 71-72%. She has been managing these episodes with breathing exercises, which help to bring her saturation back up to around 93%.  She recently had a CT scan of her lungs due to concerns of pneumonia and fluid accumulation, but initial readings suggest stability with no signs of pneumonia. There is noted scarring at the base of her lung, which appears unchanged from previous scans. The patient is currently on Trelegy inhaler  for her COPD.  In addition to her respiratory issues, Wendy Torres also has a history of pulmonary hypertension, noted on an echocardiogram in 2022. She is due to see a cardiologist soon for further evaluation.   Outpatient Encounter Medications as of 02/22/2023  Medication Sig   acetaminophen (TYLENOL) 325 MG tablet Take 2 tablets (650 mg total) by mouth every 6 (six) hours as needed for mild pain (or Fever >/= 101). (Patient taking differently: Take 650 mg by mouth every 6 (six) hours as needed for mild pain (pain score 1-3).)   albuterol (VENTOLIN HFA) 108 (90 Base) MCG/ACT inhaler INHALE 2 PUFFS INTO THE LUNGS EVERY 4 HOURS AS NEEDED FOR WHEEZING OR SHORTNESS OF BREATH. (Patient taking differently: Inhale 2 puffs into the lungs as needed for wheezing or shortness of breath.)   ALPRAZolam (XANAX) 0.25 MG tablet TAKE 1 TABLET BY MOUTH TWICE A DAY AS NEEDED (Patient taking differently: Take 0.25 mg by mouth as needed for anxiety.)   amLODipine (NORVASC) 10 MG tablet Take 1 tablet (10 mg total) by mouth daily.   aspirin EC 81 MG tablet Take 1 tablet (81 mg total) by mouth daily. Swallow whole.   cloNIDine (CATAPRES) 0.2 MG tablet Take 1 tablet (0.2 mg total) by mouth 2 (two) times daily.   Fexofenadine HCl (MUCINEX ALLERGY PO) Take 1 tablet by mouth as needed (congestion).   fluocinolone (SYNALAR) 0.01 % external solution APPLY TOPICALLY 2 TIMES DAILY AS NEEDED.   furosemide (LASIX) 20 MG tablet TAKE 1 TABLET BY MOUTH IN THE  MORNING   gabapentin (NEURONTIN) 600  MG tablet Take 1 tablet (600 mg total) by mouth 3 (three) times daily.   guaiFENesin (MUCINEX) 600 MG 12 hr tablet Take 1 tablet (600 mg total) by mouth 2 (two) times daily.   hydrALAZINE (APRESOLINE) 100 MG tablet Take 1 tablet (100 mg total) by mouth 3 (three) times daily.   iron polysaccharides (NU-IRON) 150 MG capsule Take 1 capsule (150 mg total) by mouth daily.   losartan (COZAAR) 100 MG tablet Take 1 tablet (100 mg total) by mouth  daily.   lovastatin (MEVACOR) 40 MG tablet Take 1 tablet (40 mg total) by mouth at bedtime.   meclizine (ANTIVERT) 12.5 MG tablet TAKE 1 TABLET BY MOUTH 3 TIMES A DAY AS NEEDED FOR DIZZINESS (Patient taking differently: Take 12.5 mg by mouth 3 (three) times daily as needed for dizziness or nausea.)   nystatin (MYCOSTATIN) 100000 UNIT/ML suspension Swish and spit 5mL two times daily after using Symbicort. (Patient taking differently: Use as directed 5 mLs in the mouth or throat in the morning and at bedtime. Swish and spit)   pantoprazole (PROTONIX) 40 MG tablet TAKE 1 TABLET BY MOUTH DAILY   polyethylene glycol (MIRALAX / GLYCOLAX) 17 g packet Take 17 g by mouth daily as needed for mild constipation or moderate constipation.   predniSONE (DELTASONE) 10 MG tablet Take 20mg  daily for 3days,Take 10mg  daily for 3days, then stop   solifenacin (VESICARE) 10 MG tablet Take 1 tablet (10 mg total) by mouth daily.   tiZANidine (ZANAFLEX) 4 MG tablet TAKE 1 TABLET BY MOUTH EVERY 6 HOURS AS NEEDED FOR MUSCLE SPASMS. (Patient taking differently: Take 4 mg by mouth every 6 (six) hours as needed for muscle spasms.)   traMADol (ULTRAM) 50 MG tablet Take 1 tablet (50 mg total) by mouth every 6 (six) hours as needed. for pain (Patient taking differently: Take 50 mg by mouth in the morning and at bedtime.)   traZODone (DESYREL) 50 MG tablet TAKE 1/2 TO 1 TABLET BY MOUTH AT BEDTIME AS NEEDED FOR SLEEP (Patient taking differently: Take 25-50 mg by mouth at bedtime as needed for sleep.)   TRELEGY ELLIPTA 200-62.5-25 MCG/ACT AEPB TAKE 1 PUFF BY MOUTH EVERY DAY   triamcinolone cream (KENALOG) 0.1 % APPLY TO AFFECTED AREA TWICE A DAY (Patient taking differently: Apply 1 Application topically as needed (eczema).)   No facility-administered encounter medications on file as of 02/22/2023.   Physical Exam: Blood pressure (!) 160/66, pulse 83, height 5\' 4"  (1.626 m), weight 205 lb 6.4 oz (93.2 kg), SpO2 94%. Gen:      No acute  distress HEENT:  EOMI, sclera anicteric Neck:     No masses; no thyromegaly Lungs:    Clear to auscultation bilaterally; normal respiratory effort CV:         Regular rate and rhythm; no murmurs Abd:      + bowel sounds; soft, non-tender; no palpable masses, no distension Ext:    No edema; adequate peripheral perfusion Skin:      Warm and dry; no rash Neuro: alert and oriented x 3 Psych: normal mood and affect   Data Reviewed: Imaging CT chest 10/17/2008- centrilobular emphysema in the upper lobes, 2 mm nodule in the right lung.  Chest x-ray 06/12/2021-no active cardiopulmonary disease CT chest 02/15/2023-radiology read is pending.  By my review it appears stable with emphysema, stable scarring/atelectasis at the base I have reviewed the images personally  PFTs  12/21/2017 FVC 1.75 [9%), FEV1 1.10 [73%], F/F 63, TLC 94%, DLCO  41% Moderate obstruction, severe diffusion defect.  6-minute walk test 02/04/2018 96 m, Patient had to stop for desats.  Titrated to 3 L oxygen  Labs CBC 02/04/2018-WBC 7.4, eos 2.5%, absolute eosinophil count 185 Alpha-1 antitrypsin 12/05/2017-159, PI MM IgE 12/05/2017-224  Cardiac: Echocardiogram 01/24/2021 LVEF 60 to 65%, mild concentric LVH Severely elevated PA systolic pressure, RVSP 66  Sleep: Sleep study 04/28/2021 No OSA, AHI 0.  Noted oxygen desaturation.  Assessment:  COPD PFTs reviewed which show moderate obstruction with no significant bronchodilator response. Significant desaturation with exertion despite 4L supplemental oxygen. Recent CT scan shows stable scarring at the base of the lung and no evidence of pneumonia. Patient is currently on Trelegy inhaler.  -Continue Trelegy inhaler. -Continue supplemental oxygen at 4L. Doing better on Trelegy and will continue the same  Pulmonary Hypertension Noted on echocardiogram in 2022, likely secondary to COPD. Considering the possibility that this may be contributing to the patient's shortness of  breath. -Contact cardiologist (Dr. Servando Salina) to order a repeat echocardiogram to reassess pulmonary hypertension and cardiac function.  Follow-up in 3-4 months.   Health maintenance 12/03/2013-Prevnar 12/08/2018-Pneumovax  Plan/Recommendations: - Continue Trelegy - Continue supplemental oxygen - Echocardiogram  Follow-up in 4 months  Chilton Greathouse MD [facility] 02/22/2023, 3:32 PM  CC: Corwin Levins, MD

## 2023-02-23 ENCOUNTER — Ambulatory Visit: Payer: Medicare Other | Admitting: Neurology

## 2023-02-23 ENCOUNTER — Encounter: Payer: Self-pay | Admitting: Neurology

## 2023-02-23 VITALS — BP 155/64 | HR 73 | Ht 64.0 in | Wt 204.5 lb

## 2023-02-23 DIAGNOSIS — M542 Cervicalgia: Secondary | ICD-10-CM

## 2023-02-23 DIAGNOSIS — I679 Cerebrovascular disease, unspecified: Secondary | ICD-10-CM | POA: Diagnosis not present

## 2023-02-23 DIAGNOSIS — R7982 Elevated C-reactive protein (CRP): Secondary | ICD-10-CM | POA: Diagnosis not present

## 2023-02-23 DIAGNOSIS — E079 Disorder of thyroid, unspecified: Secondary | ICD-10-CM | POA: Diagnosis not present

## 2023-02-23 DIAGNOSIS — R7989 Other specified abnormal findings of blood chemistry: Secondary | ICD-10-CM | POA: Diagnosis not present

## 2023-02-23 DIAGNOSIS — R519 Headache, unspecified: Secondary | ICD-10-CM | POA: Diagnosis not present

## 2023-02-23 DIAGNOSIS — Z1329 Encounter for screening for other suspected endocrine disorder: Secondary | ICD-10-CM | POA: Diagnosis not present

## 2023-02-23 NOTE — Progress Notes (Signed)
Chief Complaint  Patient presents with   New Patient (Initial Visit)    Pt in room 15, daughter Katrina in room. New patient here for frontal headache. Pt reports headaches for a while, pt said her neck hurts sharp pain.  Pt also reports sharp pain in back of head that constant at times, daughter said pt mentioned her head feels heavy. Takes tylenol and ultram which doesn't help.      ASSESSMENT AND PLAN  Wendy Torres is a 82 y.o. female   New onset persistent headache Cerebrovascular disease Radiating pain from right neck to right shoulder  ESR C-reactive protein to rule out temporal arteritis  Significant tenderness at right lower jaw, nuchal region,   MRI of the brain MRI of cervical spine to rule out radiculopathy She is taking Tylenol as needed, also tramadol as needed from primary care also suggested warm compression    DIAGNOSTIC DATA (LABS, IMAGING, TESTING) - I reviewed patient records, labs, notes, testing and imaging myself where available.   MEDICAL HISTORY:  Wendy Torres, is a 82 year old female, accompanied by her daughter Katrina seen in request by her primary care doctor Corwin Levins, for evaluation of constant headache, initial evaluation February 23, 2023  History is obtained from the patient and review of electronic medical records. I personally reviewed pertinent available imaging films in PACS.   PMHx of  COPD CHF Chronic insomnia HTN Anxiety HLD Previous smoker Stroke Hx of bilateral Hip replacement Chronic knee pain,   She lives at home with her family, significant breathing difficulty, rely on oxygen 24/7, also complains of significant gait abnormality due to bilateral knee pain, came in wheelchair today,  She denies a previous history of headache, since beginning of 2024, she has been complaining daily headaches, mainly on the right side, starting at the right occipital region, also involving right lower jaw, constant achy pain, pressure,  sometimes radiating pain to right lower jaw, no chewing difficulty, she has been wearing denture for many years,  She does have chronic neck pain, radiating pain to right shoulder  CT head without contrast August 2024 showed generalized atrophy extensive periventricular white matter disease  Lab in 2024, ferritin 17.7, iron level was decreased 32, hemoglobin of 9.7, is under the care of hematologist, CMP showed low potassium 3.2   PHYSICAL EXAM:   Vitals:   02/23/23 1029  BP: (!) 155/64  Pulse: 73  Weight: 204 lb 8 oz (92.8 kg)  Height: 5\' 4"  (1.626 m)     Body mass index is 35.1 kg/m.  PHYSICAL EXAMNIATION:  Gen: NAD, conversant, well nourised, well groomed                     Cardiovascular: Regular rate rhythm, no peripheral edema, warm, nontender. Eyes: Conjunctivae clear without exudates or hemorrhage Neck: Supple, no carotid bruits.  Significant tenderness at the right nuchal area, right lower jaw, submandibular region, Pulmonary: Clear to auscultation bilaterally   NEUROLOGICAL EXAM:  MENTAL STATUS: Speech/cognition: Awake, alert, oriented to history taking and casual conversation, came in wheelchair, had portable nasal oxygen CRANIAL NERVES: CN II: Visual fields are full to confrontation. Pupils are round equal and briskly reactive to light. CN III, IV, VI: extraocular movement are normal. No ptosis. CN V: Facial sensation is intact to light touch CN VII: Face is symmetric with normal eye closure  CN VIII: Hearing is normal to causal conversation. CN IX, X: Phonation is normal. CN XI: Head turning  and shoulder shrug are intact  MOTOR: There is no pronator drift of out-stretched arms. Muscle bulk and tone are normal. Muscle strength is normal.  REFLEXES: Reflexes are 2+ and symmetric at the biceps, triceps, 1/1 knees, and ankles. Plantar responses are flexor.  SENSORY: Intact to light touch, pinprick and vibratory sensation are intact in fingers and  toes.  COORDINATION: There is no trunk or limb dysmetria noted.  GAIT/STANCE: Deferred  REVIEW OF SYSTEMS:  Full 14 system review of systems performed and notable only for as above All other review of systems were negative.   ALLERGIES: Allergies  Allergen Reactions   Chlorthalidone Shortness Of Breath    HOME MEDICATIONS: Current Outpatient Medications  Medication Sig Dispense Refill   acetaminophen (TYLENOL) 325 MG tablet Take 2 tablets (650 mg total) by mouth every 6 (six) hours as needed for mild pain (or Fever >/= 101). (Patient taking differently: Take 650 mg by mouth every 6 (six) hours as needed for mild pain (pain score 1-3).) 40 tablet 0   albuterol (VENTOLIN HFA) 108 (90 Base) MCG/ACT inhaler INHALE 2 PUFFS INTO THE LUNGS EVERY 4 HOURS AS NEEDED FOR WHEEZING OR SHORTNESS OF BREATH. (Patient taking differently: Inhale 2 puffs into the lungs as needed for wheezing or shortness of breath.) 8.5 each 1   ALPRAZolam (XANAX) 0.25 MG tablet TAKE 1 TABLET BY MOUTH TWICE A DAY AS NEEDED (Patient taking differently: Take 0.25 mg by mouth as needed for anxiety.) 60 tablet 2   amLODipine (NORVASC) 10 MG tablet Take 1 tablet (10 mg total) by mouth daily. 90 tablet 3   aspirin EC 81 MG tablet Take 1 tablet (81 mg total) by mouth daily. Swallow whole.     cloNIDine (CATAPRES) 0.2 MG tablet Take 1 tablet (0.2 mg total) by mouth 2 (two) times daily. 180 tablet 3   fluocinolone (SYNALAR) 0.01 % external solution APPLY TOPICALLY 2 TIMES DAILY AS NEEDED. 60 mL 0   furosemide (LASIX) 20 MG tablet TAKE 1 TABLET BY MOUTH IN THE  MORNING 100 tablet 2   gabapentin (NEURONTIN) 600 MG tablet Take 1 tablet (600 mg total) by mouth 3 (three) times daily. 270 tablet 1   guaiFENesin (MUCINEX) 600 MG 12 hr tablet Take 1 tablet (600 mg total) by mouth 2 (two) times daily. 30 tablet 0   hydrALAZINE (APRESOLINE) 100 MG tablet Take 1 tablet (100 mg total) by mouth 3 (three) times daily. 270 tablet 3   iron  polysaccharides (NU-IRON) 150 MG capsule Take 1 capsule (150 mg total) by mouth daily. 90 capsule 1   losartan (COZAAR) 100 MG tablet Take 1 tablet (100 mg total) by mouth daily. 90 tablet 3   lovastatin (MEVACOR) 40 MG tablet Take 1 tablet (40 mg total) by mouth at bedtime.     meclizine (ANTIVERT) 12.5 MG tablet TAKE 1 TABLET BY MOUTH 3 TIMES A DAY AS NEEDED FOR DIZZINESS (Patient taking differently: Take 12.5 mg by mouth 3 (three) times daily as needed for dizziness or nausea.) 30 tablet 2   nystatin (MYCOSTATIN) 100000 UNIT/ML suspension Swish and spit 5mL two times daily after using Symbicort. (Patient taking differently: Use as directed 5 mLs in the mouth or throat in the morning and at bedtime. Swish and spit) 473 mL 5   pantoprazole (PROTONIX) 40 MG tablet TAKE 1 TABLET BY MOUTH DAILY 100 tablet 2   polyethylene glycol (MIRALAX / GLYCOLAX) 17 g packet Take 17 g by mouth daily as needed for mild constipation  or moderate constipation.     solifenacin (VESICARE) 10 MG tablet Take 1 tablet (10 mg total) by mouth daily. 90 tablet 1   tiZANidine (ZANAFLEX) 4 MG tablet TAKE 1 TABLET BY MOUTH EVERY 6 HOURS AS NEEDED FOR MUSCLE SPASMS. (Patient taking differently: Take 4 mg by mouth every 6 (six) hours as needed for muscle spasms.) 50 tablet 1   traMADol (ULTRAM) 50 MG tablet Take 1 tablet (50 mg total) by mouth every 6 (six) hours as needed. for pain (Patient taking differently: Take 50 mg by mouth in the morning and at bedtime.) 120 tablet 2   traZODone (DESYREL) 50 MG tablet TAKE 1/2 TO 1 TABLET BY MOUTH AT BEDTIME AS NEEDED FOR SLEEP (Patient taking differently: Take 25-50 mg by mouth at bedtime as needed for sleep.) 90 tablet 1   TRELEGY ELLIPTA 200-62.5-25 MCG/ACT AEPB TAKE 1 PUFF BY MOUTH EVERY DAY 60 each 2   triamcinolone cream (KENALOG) 0.1 % APPLY TO AFFECTED AREA TWICE A DAY (Patient taking differently: Apply 1 Application topically as needed (eczema).) 30 g 1   Fexofenadine HCl (MUCINEX  ALLERGY PO) Take 1 tablet by mouth as needed (congestion). (Patient not taking: Reported on 02/23/2023)     predniSONE (DELTASONE) 10 MG tablet Take 20mg  daily for 3days,Take 10mg  daily for 3days, then stop (Patient not taking: Reported on 02/23/2023) 10 tablet 0   No current facility-administered medications for this visit.    PAST MEDICAL HISTORY: Past Medical History:  Diagnosis Date   ABNORMAL CHEST XRAY 11/05/2008   ACUTE URIS OF UNSPECIFIED SITE 10/22/2007   ALLERGIC RHINITIS 12/09/2006   ANEMIA 01/14/2009   Anxiety    Arthritis    ASTHMA 12/09/2006   Asthma    Cataract    bilateral -removed   CHF (congestive heart failure) (HCC)    CHF (congestive heart failure) (HCC)    Chronic pain syndrome 02/16/2009   COPD 12/09/2006   DEGENERATIVE JOINT DISEASE 12/09/2006   DIZZINESS 03/30/2010   DYSPNEA 01/27/2010   with heavy exertion   FREQUENCY, URINARY 03/30/2010   GERD 12/09/2006   Glossitis 01/14/2009   HELICOBACTER PYLORI GASTRITIS, HX OF 12/09/2006   HIATAL HERNIA 03/01/2010   History of hiatal hernia    HYPERLIPIDEMIA 04/18/2007   HYPERSOMNIA 08/31/2008   HYPERTENSION 12/09/2006   INSOMNIA-SLEEP DISORDER-UNSPEC 11/05/2008   LOW BACK PAIN 12/09/2006   Morbid obesity (HCC) 12/09/2006   Nonspecific (abnormal) findings on radiological and other examination of body structure 11/05/2008   OSTEOPOROSIS 12/09/2006   OVERACTIVE BLADDER 04/14/2008   PALPITATIONS, RECURRENT 01/27/2010   Stroke (HCC)    pt. stated light one years ago   SYNCOPE 11/05/2008   TRANSIENT ISCHEMIC ATTACK, HX OF 12/09/2006    PAST SURGICAL HISTORY: Past Surgical History:  Procedure Laterality Date   BIOPSY  01/18/2023   Procedure: BIOPSY;  Surgeon: Meryl Dare, MD;  Location: [facility];  Service: Gastroenterology;;   COLONOSCOPY     ESOPHAGOGASTRODUODENOSCOPY (EGD) WITH PROPOFOL N/A 01/18/2023   Procedure: ESOPHAGOGASTRODUODENOSCOPY (EGD) WITH PROPOFOL;  Surgeon: Meryl Dare, MD;   Location: [facility];  Service: Gastroenterology;  Laterality: N/A;   HOT HEMOSTASIS N/A 01/18/2023   Procedure: HOT HEMOSTASIS (ARGON PLASMA COAGULATION/BICAP);  Surgeon: Meryl Dare, MD;  Location: [facility];  Service: Gastroenterology;  Laterality: N/A;   LUMBAR DISC SURGERY     TONSILLECTOMY     as a child   TOTAL HIP ARTHROPLASTY Right 11/06/2012   Procedure: RIGHT TOTAL HIP ARTHROPLASTY WITH ACETABULAR AUTOGRAFT;  Surgeon: Loanne Drilling, MD;  Location: [facility];  Service: Orthopedics;  Laterality: Right;   TOTAL HIP ARTHROPLASTY Left 02/18/2014   Procedure: LEFT TOTAL HIP ARTHROPLASTY;  Surgeon: Loanne Drilling, MD;  Location: [facility];  Service: Orthopedics;  Laterality: Left;   TUBAL LIGATION      FAMILY HISTORY: Family History  Problem Relation Age of Onset   Heart disease Mother        Had pacemaker   Lung cancer Father    Hypertension Brother    Diabetes Neg Hx    Colon cancer Neg Hx    Colon polyps Neg Hx    Kidney disease Neg Hx    Gallbladder disease Neg Hx    Esophageal cancer Neg Hx    Allergic rhinitis Neg Hx    Angioedema Neg Hx    Asthma Neg Hx    Atopy Neg Hx    Eczema Neg Hx    Immunodeficiency Neg Hx    Urticaria Neg Hx     SOCIAL HISTORY: Social History   Socioeconomic History   Marital status: Widowed    Spouse name: Not on file   Number of children: 10   Years of education: Not on file   Highest education level: Not on file  Occupational History   Occupation: Retired  Tobacco Use   Smoking status: Former    Current packs/day: 0.00    Average packs/day: 0.5 packs/day for 25.0 years (12.5 ttl pk-yrs)    Types: Cigarettes    Start date: 10/30/1947    Quit date: 10/29/1972    Years since quitting: 50.3   Smokeless tobacco: Never  Vaping Use   Vaping status: Never Used  Substance and Sexual Activity   Alcohol use: No    Alcohol/week: 0.0 standard drinks of alcohol   Drug use: No   Sexual activity: Not Currently  Other Topics Concern    Not on file  Social History Narrative   Right handed    Wears readers   Drinks 1 cup of coffee in am   Drinks soda 1 per day.   Social Determinants of Health   Financial Resource Strain: Low Risk  (06/23/2022)   Overall Financial Resource Strain (CARDIA)    Difficulty of Paying Living Expenses: Not hard at all  Food Insecurity: No Food Insecurity (01/15/2023)   Hunger Vital Sign    Worried About Running Out of Food in the Last Year: Never true    Ran Out of Food in the Last Year: Never true  Transportation Needs: No Transportation Needs (01/15/2023)   PRAPARE - Administrator, Civil Service (Medical): No    Lack of Transportation (Non-Medical): No  Physical Activity: Inactive (06/23/2022)   Exercise Vital Sign    Days of Exercise per Week: 0 days    Minutes of Exercise per Session: 0 min  Stress: No Stress Concern Present (06/23/2022)   [employer] of Occupational Health - Occupational Stress Questionnaire    Feeling of Stress : Not at all  Social Connections: Moderately Integrated (06/23/2022)   Social Connection and Isolation Panel [NHANES]    Frequency of Communication with Friends and Family: More than three times a week    Frequency of Social Gatherings with Friends and Family: More than three times a week    Attends Religious Services: More than 4 times per year    Active Member of [employer] or Organizations: No    Attends Engineer, structural: More than 4  times per year    Marital Status: Widowed  Intimate Partner Violence: Not At Risk (01/15/2023)   Humiliation, Afraid, Rape, and Kick questionnaire    Fear of Current or Ex-Partner: No    Emotionally Abused: No    Physically Abused: No    Sexually Abused: No      Levert Feinstein, M.D. Ph.D.  [facility] [address] [place], [place] [address] Ph: [phone] Fax: [phone]  CC:  Melene Plan, DO [address] [place],  [place] [address]  Corwin Levins, MD

## 2023-02-24 LAB — C-REACTIVE PROTEIN: CRP: 13 mg/L — ABNORMAL HIGH (ref 0–10)

## 2023-02-24 LAB — SEDIMENTATION RATE: Sed Rate: 68 mm/h — ABNORMAL HIGH (ref 0–40)

## 2023-02-24 LAB — TSH: TSH: 1.02 u[IU]/mL (ref 0.450–4.500)

## 2023-02-27 ENCOUNTER — Telehealth: Payer: Self-pay | Admitting: Neurology

## 2023-02-27 NOTE — Telephone Encounter (Signed)
[employer] medicare NPR sent to GI [phone] 

## 2023-02-28 ENCOUNTER — Encounter: Payer: Self-pay | Admitting: Cardiology

## 2023-02-28 ENCOUNTER — Ambulatory Visit: Payer: Medicare Other | Attending: Cardiology | Admitting: Cardiology

## 2023-02-28 VITALS — BP 144/64 | HR 77 | Ht 64.0 in | Wt 205.4 lb

## 2023-02-28 DIAGNOSIS — G894 Chronic pain syndrome: Secondary | ICD-10-CM | POA: Diagnosis not present

## 2023-02-28 DIAGNOSIS — I110 Hypertensive heart disease with heart failure: Secondary | ICD-10-CM | POA: Diagnosis not present

## 2023-02-28 DIAGNOSIS — D539 Nutritional anemia, unspecified: Secondary | ICD-10-CM | POA: Diagnosis not present

## 2023-02-28 DIAGNOSIS — J9621 Acute and chronic respiratory failure with hypoxia: Secondary | ICD-10-CM | POA: Diagnosis not present

## 2023-02-28 DIAGNOSIS — E785 Hyperlipidemia, unspecified: Secondary | ICD-10-CM | POA: Diagnosis not present

## 2023-02-28 DIAGNOSIS — I5032 Chronic diastolic (congestive) heart failure: Secondary | ICD-10-CM | POA: Diagnosis not present

## 2023-02-28 DIAGNOSIS — K449 Diaphragmatic hernia without obstruction or gangrene: Secondary | ICD-10-CM | POA: Diagnosis not present

## 2023-02-28 DIAGNOSIS — K219 Gastro-esophageal reflux disease without esophagitis: Secondary | ICD-10-CM | POA: Diagnosis not present

## 2023-02-28 DIAGNOSIS — M5412 Radiculopathy, cervical region: Secondary | ICD-10-CM | POA: Diagnosis not present

## 2023-02-28 DIAGNOSIS — E876 Hypokalemia: Secondary | ICD-10-CM | POA: Diagnosis not present

## 2023-02-28 DIAGNOSIS — M810 Age-related osteoporosis without current pathological fracture: Secondary | ICD-10-CM | POA: Diagnosis not present

## 2023-02-28 DIAGNOSIS — J4489 Other specified chronic obstructive pulmonary disease: Secondary | ICD-10-CM | POA: Diagnosis not present

## 2023-02-28 DIAGNOSIS — Z79899 Other long term (current) drug therapy: Secondary | ICD-10-CM

## 2023-02-28 DIAGNOSIS — M179 Osteoarthritis of knee, unspecified: Secondary | ICD-10-CM | POA: Diagnosis not present

## 2023-02-28 DIAGNOSIS — Z8673 Personal history of transient ischemic attack (TIA), and cerebral infarction without residual deficits: Secondary | ICD-10-CM | POA: Diagnosis not present

## 2023-02-28 DIAGNOSIS — I10 Essential (primary) hypertension: Secondary | ICD-10-CM | POA: Diagnosis not present

## 2023-02-28 DIAGNOSIS — K921 Melena: Secondary | ICD-10-CM | POA: Diagnosis not present

## 2023-02-28 DIAGNOSIS — I272 Pulmonary hypertension, unspecified: Secondary | ICD-10-CM | POA: Diagnosis not present

## 2023-02-28 DIAGNOSIS — Z87891 Personal history of nicotine dependence: Secondary | ICD-10-CM | POA: Diagnosis not present

## 2023-02-28 DIAGNOSIS — G4700 Insomnia, unspecified: Secondary | ICD-10-CM | POA: Diagnosis not present

## 2023-02-28 DIAGNOSIS — Q2733 Arteriovenous malformation of digestive system vessel: Secondary | ICD-10-CM | POA: Diagnosis not present

## 2023-02-28 DIAGNOSIS — R911 Solitary pulmonary nodule: Secondary | ICD-10-CM | POA: Diagnosis not present

## 2023-02-28 DIAGNOSIS — R350 Frequency of micturition: Secondary | ICD-10-CM | POA: Diagnosis not present

## 2023-02-28 DIAGNOSIS — J441 Chronic obstructive pulmonary disease with (acute) exacerbation: Secondary | ICD-10-CM | POA: Diagnosis not present

## 2023-02-28 MED ORDER — VALSARTAN 160 MG PO TABS: 160.0000 mg | ORAL_TABLET | Freq: Every day | ORAL | 3 refills | Status: DC

## 2023-02-28 MED ORDER — POTASSIUM CHLORIDE CRYS ER 20 MEQ PO TBCR: 20.0000 meq | EXTENDED_RELEASE_TABLET | Freq: Every day | ORAL | 3 refills | Status: DC

## 2023-02-28 MED ORDER — FUROSEMIDE 40 MG PO TABS
40.0000 mg | ORAL_TABLET | Freq: Every day | ORAL | 3 refills | Status: DC
Start: 2023-02-28 — End: 2024-01-21

## 2023-02-28 NOTE — Patient Instructions (Addendum)
Medication Instructions:  Your physician has recommended you make the following change in your medication:  STOP: Losartan  INCREASE: Lasix 40 mg once daily START: Potassium 20 mEq once daily START: Valsartan 160 mg once daily  *If you need a refill on your cardiac medications before your next appointment, please call your pharmacy*   Lab Work: CMET, Mag, BNP If you have labs (blood work) drawn today and your tests are completely normal, you will receive your results only by: MyChart Message (if you have MyChart) OR A paper copy in the mail If you have any lab test that is abnormal or we need to change your treatment, we will call you to review the results.   Testing/Procedures: Your physician has requested that you have an echocardiogram. Echocardiography is a painless test that uses sound waves to create images of your heart. It provides your doctor with information about the size and shape of your heart and how well your heart's chambers and valves are working. This procedure takes approximately one hour. There are no restrictions for this procedure. Please do NOT wear cologne, perfume, aftershave, or lotions (deodorant is allowed). Please arrive 15 minutes prior to your appointment time.  Follow-Up: At [facility], you and your health needs are our priority.  As part of our continuing mission to provide you with exceptional heart care, we have created designated Provider Care Teams.  These Care Teams include your primary Cardiologist (physician) and Advanced Practice Providers (APPs -  Physician Assistants and Nurse Practitioners) who all work together to provide you with the care you need, when you need it.  Your next appointment:   16 week(s)  Provider:   Thomasene Ripple, DO

## 2023-03-01 ENCOUNTER — Telehealth: Payer: Self-pay | Admitting: Neurology

## 2023-03-01 DIAGNOSIS — I679 Cerebrovascular disease, unspecified: Secondary | ICD-10-CM

## 2023-03-01 DIAGNOSIS — M542 Cervicalgia: Secondary | ICD-10-CM

## 2023-03-01 DIAGNOSIS — R519 Headache, unspecified: Secondary | ICD-10-CM

## 2023-03-01 LAB — COMPREHENSIVE METABOLIC PANEL
ALT: 9 [IU]/L (ref 0–32)
AST: 13 [IU]/L (ref 0–40)
Albumin: 4.4 g/dL (ref 3.7–4.7)
Alkaline Phosphatase: 82 [IU]/L (ref 44–121)
BUN/Creatinine Ratio: 11 — ABNORMAL LOW (ref 12–28)
BUN: 10 mg/dL (ref 8–27)
Bilirubin Total: <0.2 mg/dL (ref 0.0–1.2)
CO2: 26 mmol/L (ref 20–29)
Calcium: 10.6 mg/dL — ABNORMAL HIGH (ref 8.7–10.3)
Chloride: 96 mmol/L (ref 96–106)
Creatinine, Ser: 0.88 mg/dL (ref 0.57–1.00)
Globulin, Total: 3.1 g/dL (ref 1.5–4.5)
Glucose: 91 mg/dL (ref 70–99)
Potassium: 4.4 mmol/L (ref 3.5–5.2)
Sodium: 140 mmol/L (ref 134–144)
Total Protein: 7.5 g/dL (ref 6.0–8.5)
eGFR: 65 mL/min/{1.73_m2} (ref >59)

## 2023-03-01 LAB — PRO B NATRIURETIC PEPTIDE: NT-Pro BNP: 109 pg/mL (ref 0–738)

## 2023-03-01 LAB — MAGNESIUM: Magnesium: 1.9 mg/dL (ref 1.6–2.3)

## 2023-03-01 NOTE — Telephone Encounter (Signed)
I was able to talk with her daughter for elevated ESR 68, C-reactive protein of 13, normal TSH  Above labs were taken following her visit on February 23, 2023 for complaints of worsening headaches, also neck pain  She did have a history of COPD, acute exacerbation in September, required prednisone treatment at that time  Per daughter, her main complaint now is her neck pain, she is on schedule for MRI of the brain and cervical spine March 16, 2023  We decided to have repeat ESR C-reactive protein, waiting on the report of her CT chest,  If there remain significant elevation, no significant abnormality on the CT chest to explain the inflammatory changes, may consider prednisone treatment, temporal artery biopsy for possible temporal arteritis

## 2023-03-01 NOTE — Progress Notes (Signed)
Cardiology Office Note:    Date:  03/01/2023   ID:  Wendy Torres, DOB 1940-09-08, MRN [id number]  PCP:  Corwin Levins, MD  Cardiologist:  Thomasene Ripple, DO  Electrophysiologist:  None   Referring MD: Corwin Levins, MD   " I am doing ok"  History of Present Illness:    Wendy Torres is a 82 y.o. female with a hx of chronic respiratory failure, COPD, pulmonary hypertension, chronic hypertension here today to to be evaluated due to history of congestive heart failure  She and her daughter reports that she has had fluctuating weight gain. The patient's weight has been varying significantly over the past month, with a range from 186 to 205.4 pounds. She also reports that her blood pressure at home has also been elevated despite multiple antihypertensive. She admits to occasional shortness of breath but no chest pain.  She is in office today with her daughter.     Past Medical History:  Diagnosis Date   ABNORMAL CHEST XRAY 11/05/2008   ACUTE URIS OF UNSPECIFIED SITE 10/22/2007   ALLERGIC RHINITIS 12/09/2006   ANEMIA 01/14/2009   Anxiety    Arthritis    ASTHMA 12/09/2006   Asthma    Cataract    bilateral -removed   CHF (congestive heart failure) (HCC)    CHF (congestive heart failure) (HCC)    Chronic pain syndrome 02/16/2009   COPD 12/09/2006   DEGENERATIVE JOINT DISEASE 12/09/2006   DIZZINESS 03/30/2010   DYSPNEA 01/27/2010   with heavy exertion   FREQUENCY, URINARY 03/30/2010   GERD 12/09/2006   Glossitis 01/14/2009   HELICOBACTER PYLORI GASTRITIS, HX OF 12/09/2006   HIATAL HERNIA 03/01/2010   History of hiatal hernia    HYPERLIPIDEMIA 04/18/2007   HYPERSOMNIA 08/31/2008   HYPERTENSION 12/09/2006   INSOMNIA-SLEEP DISORDER-UNSPEC 11/05/2008   LOW BACK PAIN 12/09/2006   Morbid obesity (HCC) 12/09/2006   Nonspecific (abnormal) findings on radiological and other examination of body structure 11/05/2008   OSTEOPOROSIS 12/09/2006   OVERACTIVE BLADDER 04/14/2008    PALPITATIONS, RECURRENT 01/27/2010   Stroke (HCC)    pt. stated light one years ago   SYNCOPE 11/05/2008   TRANSIENT ISCHEMIC ATTACK, HX OF 12/09/2006    Past Surgical History:  Procedure Laterality Date   BIOPSY  01/18/2023   Procedure: BIOPSY;  Surgeon: Meryl Dare, MD;  Location: [facility];  Service: Gastroenterology;;   COLONOSCOPY     ESOPHAGOGASTRODUODENOSCOPY (EGD) WITH PROPOFOL N/A 01/18/2023   Procedure: ESOPHAGOGASTRODUODENOSCOPY (EGD) WITH PROPOFOL;  Surgeon: Meryl Dare, MD;  Location: [facility];  Service: Gastroenterology;  Laterality: N/A;   HOT HEMOSTASIS N/A 01/18/2023   Procedure: HOT HEMOSTASIS (ARGON PLASMA COAGULATION/BICAP);  Surgeon: Meryl Dare, MD;  Location: [facility];  Service: Gastroenterology;  Laterality: N/A;   LUMBAR DISC SURGERY     TONSILLECTOMY     as a child   TOTAL HIP ARTHROPLASTY Right 11/06/2012   Procedure: RIGHT TOTAL HIP ARTHROPLASTY WITH ACETABULAR AUTOGRAFT;  Surgeon: Loanne Drilling, MD;  Location: [facility];  Service: Orthopedics;  Laterality: Right;   TOTAL HIP ARTHROPLASTY Left 02/18/2014   Procedure: LEFT TOTAL HIP ARTHROPLASTY;  Surgeon: Loanne Drilling, MD;  Location: [facility];  Service: Orthopedics;  Laterality: Left;   TUBAL LIGATION      Current Medications: Current Meds  Medication Sig   acetaminophen (TYLENOL) 325 MG tablet Take 2 tablets (650 mg total) by mouth every 6 (six) hours as needed for mild pain (or Fever >/=  101). (Patient taking differently: Take 650 mg by mouth every 6 (six) hours as needed for mild pain (pain score 1-3).)   albuterol (VENTOLIN HFA) 108 (90 Base) MCG/ACT inhaler INHALE 2 PUFFS INTO THE LUNGS EVERY 4 HOURS AS NEEDED FOR WHEEZING OR SHORTNESS OF BREATH. (Patient taking differently: Inhale 2 puffs into the lungs as needed for wheezing or shortness of breath.)   ALPRAZolam (XANAX) 0.25 MG tablet TAKE 1 TABLET BY MOUTH TWICE A DAY AS NEEDED (Patient taking differently: Take 0.25 mg by mouth  as needed for anxiety.)   amLODipine (NORVASC) 10 MG tablet Take 1 tablet (10 mg total) by mouth daily.   aspirin EC 81 MG tablet Take 1 tablet (81 mg total) by mouth daily. Swallow whole.   cloNIDine (CATAPRES) 0.2 MG tablet Take 1 tablet (0.2 mg total) by mouth 2 (two) times daily.   fluocinolone (SYNALAR) 0.01 % external solution APPLY TOPICALLY 2 TIMES DAILY AS NEEDED.   furosemide (LASIX) 40 MG tablet Take 1 tablet (40 mg total) by mouth daily.   gabapentin (NEURONTIN) 600 MG tablet Take 1 tablet (600 mg total) by mouth 3 (three) times daily.   guaiFENesin (MUCINEX) 600 MG 12 hr tablet Take 1 tablet (600 mg total) by mouth 2 (two) times daily.   hydrALAZINE (APRESOLINE) 100 MG tablet Take 1 tablet (100 mg total) by mouth 3 (three) times daily.   iron polysaccharides (NU-IRON) 150 MG capsule Take 1 capsule (150 mg total) by mouth daily.   lovastatin (MEVACOR) 40 MG tablet Take 1 tablet (40 mg total) by mouth at bedtime.   meclizine (ANTIVERT) 12.5 MG tablet TAKE 1 TABLET BY MOUTH 3 TIMES A DAY AS NEEDED FOR DIZZINESS (Patient taking differently: Take 12.5 mg by mouth 3 (three) times daily as needed for dizziness or nausea.)   nystatin (MYCOSTATIN) 100000 UNIT/ML suspension Swish and spit 5mL two times daily after using Symbicort. (Patient taking differently: Use as directed 5 mLs in the mouth or throat in the morning and at bedtime. Swish and spit)   pantoprazole (PROTONIX) 40 MG tablet TAKE 1 TABLET BY MOUTH DAILY   polyethylene glycol (MIRALAX / GLYCOLAX) 17 g packet Take 17 g by mouth daily as needed for mild constipation or moderate constipation.   potassium chloride SA (KLOR-CON M20) 20 MEQ tablet Take 1 tablet (20 mEq total) by mouth daily.   predniSONE (DELTASONE) 10 MG tablet Take 20mg  daily for 3days,Take 10mg  daily for 3days, then stop   solifenacin (VESICARE) 10 MG tablet Take 1 tablet (10 mg total) by mouth daily.   tiZANidine (ZANAFLEX) 4 MG tablet TAKE 1 TABLET BY MOUTH EVERY 6  HOURS AS NEEDED FOR MUSCLE SPASMS. (Patient taking differently: Take 4 mg by mouth every 6 (six) hours as needed for muscle spasms.)   traMADol (ULTRAM) 50 MG tablet Take 1 tablet (50 mg total) by mouth every 6 (six) hours as needed. for pain (Patient taking differently: Take 50 mg by mouth in the morning and at bedtime.)   traZODone (DESYREL) 50 MG tablet TAKE 1/2 TO 1 TABLET BY MOUTH AT BEDTIME AS NEEDED FOR SLEEP (Patient taking differently: Take 25-50 mg by mouth at bedtime as needed for sleep.)   TRELEGY ELLIPTA 200-62.5-25 MCG/ACT AEPB TAKE 1 PUFF BY MOUTH EVERY DAY   triamcinolone cream (KENALOG) 0.1 % APPLY TO AFFECTED AREA TWICE A DAY (Patient taking differently: Apply 1 Application topically as needed (eczema).)   valsartan (DIOVAN) 160 MG tablet Take 1 tablet (160 mg total) by  mouth daily.   [DISCONTINUED] furosemide (LASIX) 20 MG tablet TAKE 1 TABLET BY MOUTH IN THE  MORNING   [DISCONTINUED] losartan (COZAAR) 100 MG tablet Take 1 tablet (100 mg total) by mouth daily.     Allergies:   Chlorthalidone   Social History   Socioeconomic History   Marital status: Widowed    Spouse name: Not on file   Number of children: 10   Years of education: Not on file   Highest education level: Not on file  Occupational History   Occupation: Retired  Tobacco Use   Smoking status: Former    Current packs/day: 0.00    Average packs/day: 0.5 packs/day for 25.0 years (12.5 ttl pk-yrs)    Types: Cigarettes    Start date: 10/30/1947    Quit date: 10/29/1972    Years since quitting: 50.3   Smokeless tobacco: Never  Vaping Use   Vaping status: Never Used  Substance and Sexual Activity   Alcohol use: No    Alcohol/week: 0.0 standard drinks of alcohol   Drug use: No   Sexual activity: Not Currently  Other Topics Concern   Not on file  Social History Narrative   Right handed    Wears readers   Drinks 1 cup of coffee in am   Drinks soda 1 per day.   Social Determinants of Health   Financial  Resource Strain: Low Risk  (06/23/2022)   Overall Financial Resource Strain (CARDIA)    Difficulty of Paying Living Expenses: Not hard at all  Food Insecurity: No Food Insecurity (01/15/2023)   Hunger Vital Sign    Worried About Running Out of Food in the Last Year: Never true    Ran Out of Food in the Last Year: Never true  Transportation Needs: No Transportation Needs (01/15/2023)   PRAPARE - Administrator, Civil Service (Medical): No    Lack of Transportation (Non-Medical): No  Physical Activity: Inactive (06/23/2022)   Exercise Vital Sign    Days of Exercise per Week: 0 days    Minutes of Exercise per Session: 0 min  Stress: No Stress Concern Present (06/23/2022)   [employer] of Occupational Health - Occupational Stress Questionnaire    Feeling of Stress : Not at all  Social Connections: Moderately Integrated (06/23/2022)   Social Connection and Isolation Panel [NHANES]    Frequency of Communication with Friends and Family: More than three times a week    Frequency of Social Gatherings with Friends and Family: More than three times a week    Attends Religious Services: More than 4 times per year    Active Member of [employer] or Organizations: No    Attends Engineer, structural: More than 4 times per year    Marital Status: Widowed     Family History: The patient's family history includes Heart disease in her mother; Hypertension in her brother; Lung cancer in her father. There is no history of Diabetes, Colon cancer, Colon polyps, Kidney disease, Gallbladder disease, Esophageal cancer, Allergic rhinitis, Angioedema, Asthma, Atopy, Eczema, Immunodeficiency, or Urticaria.  ROS:   Review of Systems  Constitution: Negative for decreased appetite, fever and weight gain.  HENT: Negative for congestion, ear discharge, hoarse voice and sore throat.   Eyes: Negative for discharge, redness, vision loss in right eye and visual halos.  Cardiovascular: Negative for chest  pain, dyspnea on exertion, leg swelling, orthopnea and palpitations.  Respiratory: Negative for cough, hemoptysis, shortness of breath and snoring.   Endocrine:  Negative for heat intolerance and polyphagia.  Hematologic/Lymphatic: Negative for bleeding problem. Does not bruise/bleed easily.  Skin: Negative for flushing, nail changes, rash and suspicious lesions.  Musculoskeletal: Negative for arthritis, joint pain, muscle cramps, myalgias, neck pain and stiffness.  Gastrointestinal: Negative for abdominal pain, bowel incontinence, diarrhea and excessive appetite.  Genitourinary: Negative for decreased libido, genital sores and incomplete emptying.  Neurological: Negative for brief paralysis, focal weakness, headaches and loss of balance.  Psychiatric/Behavioral: Negative for altered mental status, depression and suicidal ideas.  Allergic/Immunologic: Negative for HIV exposure and persistent infections.    EKGs/Labs/Other Studies Reviewed:    The following studies were reviewed today:   EKG:  The ekg ordered today demonstrates   Recent Labs: 01/29/2023: B Natriuretic Peptide 38.3 02/08/2023: Hemoglobin 9.7; Platelets 419.0 02/23/2023: TSH 1.020 02/28/2023: ALT 9; BUN 10; Creatinine, Ser 0.88; Magnesium 1.9; NT-Pro BNP 109; Potassium 4.4; Sodium 140  Recent Lipid Panel    Component Value Date/Time   CHOL 167 11/09/2022 1447   TRIG 73.0 11/09/2022 1447   HDL 63.10 11/09/2022 1447   CHOLHDL 3 11/09/2022 1447   VLDL 14.6 11/09/2022 1447   LDLCALC 89 11/09/2022 1447    Physical Exam:    VS:  BP (!) 144/64 (BP Location: Left Arm, Patient Position: Sitting, Cuff Size: Large)   Pulse 77   Ht 5\' 4"  (1.626 m)   Wt 205 lb 6.4 oz (93.2 kg)   SpO2 91%   BMI 35.26 kg/m     Wt Readings from Last 3 Encounters:  02/28/23 205 lb 6.4 oz (93.2 kg)  02/23/23 204 lb 8 oz (92.8 kg)  02/22/23 205 lb 6.4 oz (93.2 kg)     GEN: Well nourished, well developed in no acute distress HEENT:  Normal NECK: No JVD; No carotid bruits LYMPHATICS: No lymphadenopathy CARDIAC: S1S2 noted,RRR, no murmurs, rubs, gallops RESPIRATORY:  Clear to auscultation without rales, wheezing or rhonchi  ABDOMEN: Soft, non-tender, non-distended, +bowel sounds, no guarding. EXTREMITIES: No edema, No cyanosis, no clubbing MUSCULOSKELETAL:  No deformity  SKIN: Warm and dry NEUROLOGIC:  Alert and oriented x 3, non-focal PSYCHIATRIC:  Normal affect, good insight  ASSESSMENT:    1. Chronic diastolic heart failure (HCC)   2. Medication management   3. Morbid obesity (HCC)   4. Essential hypertension    PLAN:    Congestive Diastolic heart Failure (CHF) Recent diagnosis with weight fluctuations suggestive of fluid retention.  Currently on Lasix, Norvasc, Losartan, and Hydralazine. -Increase Lasix from 20mg  to 40mg  daily to better manage fluid retention. -Order repeat echocardiogram to assess current heart function and valve status. -Obtain blood work to assess current kidney function.  Hypertension Long-standing history with left ventricular hypertrophy on previous echocardiogram. Current medications include Clonidine, Norvasc, Losartan, and Hydralazine. -Discontinue Losartan and initiate Valsartan for better blood pressure control.  Morbid obesity-the patient understands the need to lose weight with diet and exercise. We have discussed specific strategies for this.  General Health Maintenance / Followup Plans -Continue monitoring weight closely for signs of fluid retention. -Encourage consistent medication use and adherence to prescribed regimen. -Follow-up after obtaining results of echocardiogram and blood work.  The patient is in agreement with the above plan. The patient left the office in stable condition.  The patient will follow up in   Medication Adjustments/Labs and Tests Ordered: Current medicines are reviewed at length with the patient today.  Concerns regarding medicines are  outlined above.  Orders Placed This Encounter  Procedures   Comprehensive Metabolic  Panel (CMET)   Magnesium   Pro b natriuretic peptide (BNP)   ECHOCARDIOGRAM COMPLETE   Meds ordered this encounter  Medications   furosemide (LASIX) 40 MG tablet    Sig: Take 1 tablet (40 mg total) by mouth daily.    Dispense:  90 tablet    Refill:  3   potassium chloride SA (KLOR-CON M20) 20 MEQ tablet    Sig: Take 1 tablet (20 mEq total) by mouth daily.    Dispense:  90 tablet    Refill:  3   valsartan (DIOVAN) 160 MG tablet    Sig: Take 1 tablet (160 mg total) by mouth daily.    Dispense:  90 tablet    Refill:  3    Patient Instructions  Medication Instructions:  Your physician has recommended you make the following change in your medication:  STOP: Losartan  INCREASE: Lasix 40 mg once daily START: Potassium 20 mEq once daily START: Valsartan 160 mg once daily  *If you need a refill on your cardiac medications before your next appointment, please call your pharmacy*   Lab Work: CMET, Mag, BNP If you have labs (blood work) drawn today and your tests are completely normal, you will receive your results only by: MyChart Message (if you have MyChart) OR A paper copy in the mail If you have any lab test that is abnormal or we need to change your treatment, we will call you to review the results.   Testing/Procedures: Your physician has requested that you have an echocardiogram. Echocardiography is a painless test that uses sound waves to create images of your heart. It provides your doctor with information about the size and shape of your heart and how well your heart's chambers and valves are working. This procedure takes approximately one hour. There are no restrictions for this procedure. Please do NOT wear cologne, perfume, aftershave, or lotions (deodorant is allowed). Please arrive 15 minutes prior to your appointment time.  Follow-Up: At [facility], you and your health  needs are our priority.  As part of our continuing mission to provide you with exceptional heart care, we have created designated Provider Care Teams.  These Care Teams include your primary Cardiologist (physician) and Advanced Practice Providers (APPs -  Physician Assistants and Nurse Practitioners) who all work together to provide you with the care you need, when you need it.  Your next appointment:   16 week(s)  Provider:   Thomasene Ripple, DO    Adopting a Healthy Lifestyle.  Know what a healthy weight is for you (roughly BMI <25) and aim to maintain this   Aim for 7+ servings of fruits and vegetables daily   65-80+ fluid ounces of water or unsweet tea for healthy kidneys   Limit to max 1 drink of alcohol per day; avoid smoking/tobacco   Limit animal fats in diet for cholesterol and heart health - choose grass fed whenever available   Avoid highly processed foods, and foods high in saturated/trans fats   Aim for low stress - take time to unwind and care for your mental health   Aim for 150 min of moderate intensity exercise weekly for heart health, and weights twice weekly for bone health   Aim for 7-9 hours of sleep daily   When it comes to diets, agreement about the perfect plan isnt easy to find, even among the experts. Experts at the [facility] of [employer] developed an idea known as the Healthy Eating Plate.  Just imagine a plate divided into logical, healthy portions.   The emphasis is on diet quality:   Load up on vegetables and fruits - one-half of your plate: Aim for color and variety, and remember that potatoes dont count.   Go for whole grains - one-quarter of your plate: Whole wheat, barley, wheat berries, quinoa, oats, brown rice, and foods made with them. If you want pasta, go with whole wheat pasta.   Protein power - one-quarter of your plate: Fish, chicken, beans, and nuts are all healthy, versatile protein sources. Limit red meat.   The diet, however,  does go beyond the plate, offering a few other suggestions.   Use healthy plant oils, such as olive, canola, soy, corn, sunflower and peanut. Check the labels, and avoid partially hydrogenated oil, which have unhealthy trans fats.   If youre thirsty, drink water. Coffee and tea are good in moderation, but skip sugary drinks and limit milk and dairy products to one or two daily servings.   The type of carbohydrate in the diet is more important than the amount. Some sources of carbohydrates, such as vegetables, fruits, whole grains, and beans-are healthier than others.   Finally, stay active  Signed, Thomasene Ripple, DO  03/01/2023 6:54 PM    [facility]

## 2023-03-02 DIAGNOSIS — J441 Chronic obstructive pulmonary disease with (acute) exacerbation: Secondary | ICD-10-CM | POA: Diagnosis not present

## 2023-03-02 DIAGNOSIS — E785 Hyperlipidemia, unspecified: Secondary | ICD-10-CM | POA: Diagnosis not present

## 2023-03-02 DIAGNOSIS — J9621 Acute and chronic respiratory failure with hypoxia: Secondary | ICD-10-CM | POA: Diagnosis not present

## 2023-03-02 DIAGNOSIS — G894 Chronic pain syndrome: Secondary | ICD-10-CM | POA: Diagnosis not present

## 2023-03-02 DIAGNOSIS — K449 Diaphragmatic hernia without obstruction or gangrene: Secondary | ICD-10-CM | POA: Diagnosis not present

## 2023-03-02 DIAGNOSIS — M810 Age-related osteoporosis without current pathological fracture: Secondary | ICD-10-CM | POA: Diagnosis not present

## 2023-03-02 DIAGNOSIS — Z8673 Personal history of transient ischemic attack (TIA), and cerebral infarction without residual deficits: Secondary | ICD-10-CM | POA: Diagnosis not present

## 2023-03-02 DIAGNOSIS — D539 Nutritional anemia, unspecified: Secondary | ICD-10-CM | POA: Diagnosis not present

## 2023-03-02 DIAGNOSIS — R911 Solitary pulmonary nodule: Secondary | ICD-10-CM | POA: Diagnosis not present

## 2023-03-02 DIAGNOSIS — Q2733 Arteriovenous malformation of digestive system vessel: Secondary | ICD-10-CM | POA: Diagnosis not present

## 2023-03-02 DIAGNOSIS — I5032 Chronic diastolic (congestive) heart failure: Secondary | ICD-10-CM | POA: Diagnosis not present

## 2023-03-02 DIAGNOSIS — K921 Melena: Secondary | ICD-10-CM | POA: Diagnosis not present

## 2023-03-02 DIAGNOSIS — M179 Osteoarthritis of knee, unspecified: Secondary | ICD-10-CM | POA: Diagnosis not present

## 2023-03-02 DIAGNOSIS — I110 Hypertensive heart disease with heart failure: Secondary | ICD-10-CM | POA: Diagnosis not present

## 2023-03-02 DIAGNOSIS — E876 Hypokalemia: Secondary | ICD-10-CM | POA: Diagnosis not present

## 2023-03-02 DIAGNOSIS — J4489 Other specified chronic obstructive pulmonary disease: Secondary | ICD-10-CM | POA: Diagnosis not present

## 2023-03-02 DIAGNOSIS — G4700 Insomnia, unspecified: Secondary | ICD-10-CM | POA: Diagnosis not present

## 2023-03-02 DIAGNOSIS — K219 Gastro-esophageal reflux disease without esophagitis: Secondary | ICD-10-CM | POA: Diagnosis not present

## 2023-03-02 DIAGNOSIS — Z87891 Personal history of nicotine dependence: Secondary | ICD-10-CM | POA: Diagnosis not present

## 2023-03-02 DIAGNOSIS — R350 Frequency of micturition: Secondary | ICD-10-CM | POA: Diagnosis not present

## 2023-03-02 DIAGNOSIS — M5412 Radiculopathy, cervical region: Secondary | ICD-10-CM | POA: Diagnosis not present

## 2023-03-02 DIAGNOSIS — I272 Pulmonary hypertension, unspecified: Secondary | ICD-10-CM | POA: Diagnosis not present

## 2023-03-05 ENCOUNTER — Other Ambulatory Visit: Payer: Self-pay | Admitting: Internal Medicine

## 2023-03-06 ENCOUNTER — Other Ambulatory Visit: Payer: Self-pay

## 2023-03-08 ENCOUNTER — Other Ambulatory Visit (INDEPENDENT_AMBULATORY_CARE_PROVIDER_SITE_OTHER): Payer: Self-pay

## 2023-03-08 ENCOUNTER — Encounter: Payer: Self-pay | Admitting: Internal Medicine

## 2023-03-08 DIAGNOSIS — R350 Frequency of micturition: Secondary | ICD-10-CM | POA: Diagnosis not present

## 2023-03-08 DIAGNOSIS — K219 Gastro-esophageal reflux disease without esophagitis: Secondary | ICD-10-CM | POA: Diagnosis not present

## 2023-03-08 DIAGNOSIS — G894 Chronic pain syndrome: Secondary | ICD-10-CM | POA: Diagnosis not present

## 2023-03-08 DIAGNOSIS — M810 Age-related osteoporosis without current pathological fracture: Secondary | ICD-10-CM | POA: Diagnosis not present

## 2023-03-08 DIAGNOSIS — G4700 Insomnia, unspecified: Secondary | ICD-10-CM | POA: Diagnosis not present

## 2023-03-08 DIAGNOSIS — K921 Melena: Secondary | ICD-10-CM | POA: Diagnosis not present

## 2023-03-08 DIAGNOSIS — M5412 Radiculopathy, cervical region: Secondary | ICD-10-CM | POA: Diagnosis not present

## 2023-03-08 DIAGNOSIS — R911 Solitary pulmonary nodule: Secondary | ICD-10-CM | POA: Diagnosis not present

## 2023-03-08 DIAGNOSIS — R519 Headache, unspecified: Secondary | ICD-10-CM | POA: Diagnosis not present

## 2023-03-08 DIAGNOSIS — I5032 Chronic diastolic (congestive) heart failure: Secondary | ICD-10-CM | POA: Diagnosis not present

## 2023-03-08 DIAGNOSIS — Q2733 Arteriovenous malformation of digestive system vessel: Secondary | ICD-10-CM | POA: Diagnosis not present

## 2023-03-08 DIAGNOSIS — I679 Cerebrovascular disease, unspecified: Secondary | ICD-10-CM | POA: Diagnosis not present

## 2023-03-08 DIAGNOSIS — K449 Diaphragmatic hernia without obstruction or gangrene: Secondary | ICD-10-CM | POA: Diagnosis not present

## 2023-03-08 DIAGNOSIS — M542 Cervicalgia: Secondary | ICD-10-CM

## 2023-03-08 DIAGNOSIS — J441 Chronic obstructive pulmonary disease with (acute) exacerbation: Secondary | ICD-10-CM | POA: Diagnosis not present

## 2023-03-08 DIAGNOSIS — Z0289 Encounter for other administrative examinations: Secondary | ICD-10-CM

## 2023-03-08 DIAGNOSIS — E876 Hypokalemia: Secondary | ICD-10-CM | POA: Diagnosis not present

## 2023-03-08 DIAGNOSIS — I272 Pulmonary hypertension, unspecified: Secondary | ICD-10-CM | POA: Diagnosis not present

## 2023-03-08 DIAGNOSIS — E785 Hyperlipidemia, unspecified: Secondary | ICD-10-CM | POA: Diagnosis not present

## 2023-03-08 DIAGNOSIS — I110 Hypertensive heart disease with heart failure: Secondary | ICD-10-CM | POA: Diagnosis not present

## 2023-03-08 DIAGNOSIS — J9621 Acute and chronic respiratory failure with hypoxia: Secondary | ICD-10-CM | POA: Diagnosis not present

## 2023-03-08 DIAGNOSIS — Z8673 Personal history of transient ischemic attack (TIA), and cerebral infarction without residual deficits: Secondary | ICD-10-CM | POA: Diagnosis not present

## 2023-03-08 DIAGNOSIS — D539 Nutritional anemia, unspecified: Secondary | ICD-10-CM | POA: Diagnosis not present

## 2023-03-08 DIAGNOSIS — Z87891 Personal history of nicotine dependence: Secondary | ICD-10-CM | POA: Diagnosis not present

## 2023-03-08 DIAGNOSIS — J4489 Other specified chronic obstructive pulmonary disease: Secondary | ICD-10-CM | POA: Diagnosis not present

## 2023-03-08 DIAGNOSIS — M179 Osteoarthritis of knee, unspecified: Secondary | ICD-10-CM | POA: Diagnosis not present

## 2023-03-08 NOTE — Telephone Encounter (Signed)
Sorry, I dont know how to further address this, as we did the prescription orders for this last wk I believe

## 2023-03-09 LAB — C-REACTIVE PROTEIN: CRP: 3 mg/L (ref 0–10)

## 2023-03-09 LAB — SEDIMENTATION RATE: Sed Rate: 53 mm/h — ABNORMAL HIGH (ref 0–40)

## 2023-03-10 ENCOUNTER — Telehealth: Payer: Self-pay | Admitting: Neurology

## 2023-03-10 NOTE — Telephone Encounter (Signed)
Need to follow up on her CT chest result, done on Oct 17th

## 2023-03-12 ENCOUNTER — Other Ambulatory Visit: Payer: Self-pay | Admitting: Internal Medicine

## 2023-03-12 ENCOUNTER — Other Ambulatory Visit: Payer: Self-pay

## 2023-03-12 NOTE — Telephone Encounter (Signed)
Spoke with Wendy Torres verbally on this and she states the lady at PCP states that the imaging is in to view but report has not yet been released and wouldn't really give a time as to when it will be done.

## 2023-03-14 NOTE — Telephone Encounter (Signed)
Please call and check on patient, how is her headache now, a laboratory evaluation showed elevated ESR with normal C-reactive protein, this can be related to abnormality found on the CT of the chest,  We will also wait for her MRI brain and cervical findings before consider the potential diagnoses indicated by elevated ESR, such as temporal arteritis

## 2023-03-15 ENCOUNTER — Ambulatory Visit: Payer: Medicare Other | Admitting: Internal Medicine

## 2023-03-15 DIAGNOSIS — D539 Nutritional anemia, unspecified: Secondary | ICD-10-CM | POA: Diagnosis not present

## 2023-03-15 DIAGNOSIS — E876 Hypokalemia: Secondary | ICD-10-CM | POA: Diagnosis not present

## 2023-03-15 DIAGNOSIS — K921 Melena: Secondary | ICD-10-CM | POA: Diagnosis not present

## 2023-03-15 DIAGNOSIS — R911 Solitary pulmonary nodule: Secondary | ICD-10-CM | POA: Diagnosis not present

## 2023-03-15 DIAGNOSIS — K219 Gastro-esophageal reflux disease without esophagitis: Secondary | ICD-10-CM | POA: Diagnosis not present

## 2023-03-15 DIAGNOSIS — M810 Age-related osteoporosis without current pathological fracture: Secondary | ICD-10-CM | POA: Diagnosis not present

## 2023-03-15 DIAGNOSIS — G4700 Insomnia, unspecified: Secondary | ICD-10-CM | POA: Diagnosis not present

## 2023-03-15 DIAGNOSIS — J441 Chronic obstructive pulmonary disease with (acute) exacerbation: Secondary | ICD-10-CM | POA: Diagnosis not present

## 2023-03-15 DIAGNOSIS — G894 Chronic pain syndrome: Secondary | ICD-10-CM | POA: Diagnosis not present

## 2023-03-15 DIAGNOSIS — Z87891 Personal history of nicotine dependence: Secondary | ICD-10-CM | POA: Diagnosis not present

## 2023-03-15 DIAGNOSIS — Z8673 Personal history of transient ischemic attack (TIA), and cerebral infarction without residual deficits: Secondary | ICD-10-CM | POA: Diagnosis not present

## 2023-03-15 DIAGNOSIS — E785 Hyperlipidemia, unspecified: Secondary | ICD-10-CM | POA: Diagnosis not present

## 2023-03-15 DIAGNOSIS — M179 Osteoarthritis of knee, unspecified: Secondary | ICD-10-CM | POA: Diagnosis not present

## 2023-03-15 DIAGNOSIS — I5032 Chronic diastolic (congestive) heart failure: Secondary | ICD-10-CM | POA: Diagnosis not present

## 2023-03-15 DIAGNOSIS — I110 Hypertensive heart disease with heart failure: Secondary | ICD-10-CM | POA: Diagnosis not present

## 2023-03-15 DIAGNOSIS — R350 Frequency of micturition: Secondary | ICD-10-CM | POA: Diagnosis not present

## 2023-03-15 DIAGNOSIS — J9621 Acute and chronic respiratory failure with hypoxia: Secondary | ICD-10-CM | POA: Diagnosis not present

## 2023-03-15 DIAGNOSIS — Q2733 Arteriovenous malformation of digestive system vessel: Secondary | ICD-10-CM | POA: Diagnosis not present

## 2023-03-15 DIAGNOSIS — K449 Diaphragmatic hernia without obstruction or gangrene: Secondary | ICD-10-CM | POA: Diagnosis not present

## 2023-03-15 DIAGNOSIS — M5412 Radiculopathy, cervical region: Secondary | ICD-10-CM | POA: Diagnosis not present

## 2023-03-15 DIAGNOSIS — I272 Pulmonary hypertension, unspecified: Secondary | ICD-10-CM | POA: Diagnosis not present

## 2023-03-15 DIAGNOSIS — J4489 Other specified chronic obstructive pulmonary disease: Secondary | ICD-10-CM | POA: Diagnosis not present

## 2023-03-16 ENCOUNTER — Ambulatory Visit
Admission: RE | Admit: 2023-03-16 | Discharge: 2023-03-16 | Disposition: A | Discharge status: Home / Self Care | Payer: Medicare Other | Source: Ambulatory Visit | Attending: Neurology | Admitting: Neurology

## 2023-03-16 DIAGNOSIS — R519 Headache, unspecified: Secondary | ICD-10-CM | POA: Diagnosis not present

## 2023-03-16 DIAGNOSIS — M542 Cervicalgia: Secondary | ICD-10-CM

## 2023-03-16 DIAGNOSIS — I679 Cerebrovascular disease, unspecified: Secondary | ICD-10-CM | POA: Diagnosis not present

## 2023-03-17 ENCOUNTER — Other Ambulatory Visit: Payer: Self-pay | Admitting: Internal Medicine

## 2023-03-18 ENCOUNTER — Other Ambulatory Visit: Payer: Self-pay | Admitting: Internal Medicine

## 2023-03-19 ENCOUNTER — Telehealth: Payer: Self-pay | Admitting: Neurology

## 2023-03-19 ENCOUNTER — Inpatient Hospital Stay: Payer: Medicare Other

## 2023-03-19 ENCOUNTER — Other Ambulatory Visit: Payer: Self-pay

## 2023-03-19 ENCOUNTER — Inpatient Hospital Stay: Payer: Medicare Other | Admitting: Hematology and Oncology

## 2023-03-19 ENCOUNTER — Telehealth: Payer: Self-pay | Admitting: Oncology

## 2023-03-19 NOTE — Telephone Encounter (Signed)
Please call patient'sDaughter, MRI of the brain showed significant small vessel disease, there was no acute abnormality  MRI of cervical spine showed multilevel degenerative changes, moderate canal stenosis at C3-4 C4-5, C6-7 with variable degree of foraminal narrowing  Please schedule a virtual visit to review MRIs, it is okay to overbook on Wednesday injection slot   IMPRESSION:   This MRI of the brain without contrast shows the following: Mild generalized cortical atrophy that is typical for age. Extensive T2/FLAIR hyperintense foci in the cerebral hemispheres and pons consistent with moderate chronic microvascular ischemic change, more than typical for age. The pituitary gland has a reduced height within an enlarged sella turcica consistent with a partially empty sella.  This is usually an incidental finding but can also be seen with elevated intracranial pressure. No acute findings.   IMPRESSION: This MRI of the cervical spine without contrast shows the following: The spinal cord appears normal. At C2-C3, there are degenerative changes causing moderate right and mild left foraminal narrowing but no spinal stenosis or nerve root compression. At C3-C4, there are degenerative changes causing moderate spinal stenosis and moderate bilateral foraminal narrowing.  There does not appear to be any definite nerve root compression. At C4-C5, there are degenerative changes causing moderate spinal stenosis and moderate left greater than right foraminal narrowing but no definite nerve root compression. At C5-C6, there are degenerative changes causing mild spinal stenosis and mild to moderate foraminal narrowing but no nerve root compression. At C6-C7, there are degenerative changes causing moderate spinal stenosis and moderate right greater than left foraminal narrowing but no definite nerve root compression..    At C7-T1, there are degenerative changes causing mild spinal stenosis but no nerve root  compression.

## 2023-03-19 NOTE — Telephone Encounter (Signed)
Rescheduled appointment per patients daughters request Rinaldo Cloud). Patient was rescheduled for Saturday 11/23.

## 2023-03-20 ENCOUNTER — Telehealth: Payer: Self-pay | Admitting: Internal Medicine

## 2023-03-20 DIAGNOSIS — K449 Diaphragmatic hernia without obstruction or gangrene: Secondary | ICD-10-CM | POA: Diagnosis not present

## 2023-03-20 DIAGNOSIS — Q2733 Arteriovenous malformation of digestive system vessel: Secondary | ICD-10-CM | POA: Diagnosis not present

## 2023-03-20 DIAGNOSIS — G894 Chronic pain syndrome: Secondary | ICD-10-CM | POA: Diagnosis not present

## 2023-03-20 DIAGNOSIS — J9621 Acute and chronic respiratory failure with hypoxia: Secondary | ICD-10-CM | POA: Diagnosis not present

## 2023-03-20 DIAGNOSIS — R350 Frequency of micturition: Secondary | ICD-10-CM | POA: Diagnosis not present

## 2023-03-20 DIAGNOSIS — I272 Pulmonary hypertension, unspecified: Secondary | ICD-10-CM | POA: Diagnosis not present

## 2023-03-20 DIAGNOSIS — J441 Chronic obstructive pulmonary disease with (acute) exacerbation: Secondary | ICD-10-CM

## 2023-03-20 DIAGNOSIS — E876 Hypokalemia: Secondary | ICD-10-CM | POA: Diagnosis not present

## 2023-03-20 DIAGNOSIS — M810 Age-related osteoporosis without current pathological fracture: Secondary | ICD-10-CM | POA: Diagnosis not present

## 2023-03-20 DIAGNOSIS — D539 Nutritional anemia, unspecified: Secondary | ICD-10-CM | POA: Diagnosis not present

## 2023-03-20 DIAGNOSIS — G4700 Insomnia, unspecified: Secondary | ICD-10-CM | POA: Diagnosis not present

## 2023-03-20 DIAGNOSIS — R911 Solitary pulmonary nodule: Secondary | ICD-10-CM | POA: Diagnosis not present

## 2023-03-20 DIAGNOSIS — I5032 Chronic diastolic (congestive) heart failure: Secondary | ICD-10-CM | POA: Diagnosis not present

## 2023-03-20 DIAGNOSIS — E785 Hyperlipidemia, unspecified: Secondary | ICD-10-CM | POA: Diagnosis not present

## 2023-03-20 DIAGNOSIS — M179 Osteoarthritis of knee, unspecified: Secondary | ICD-10-CM | POA: Diagnosis not present

## 2023-03-20 DIAGNOSIS — J4489 Other specified chronic obstructive pulmonary disease: Secondary | ICD-10-CM | POA: Diagnosis not present

## 2023-03-20 DIAGNOSIS — K921 Melena: Secondary | ICD-10-CM | POA: Diagnosis not present

## 2023-03-20 DIAGNOSIS — K219 Gastro-esophageal reflux disease without esophagitis: Secondary | ICD-10-CM | POA: Diagnosis not present

## 2023-03-20 DIAGNOSIS — Z87891 Personal history of nicotine dependence: Secondary | ICD-10-CM | POA: Diagnosis not present

## 2023-03-20 DIAGNOSIS — I110 Hypertensive heart disease with heart failure: Secondary | ICD-10-CM | POA: Diagnosis not present

## 2023-03-20 DIAGNOSIS — Z8673 Personal history of transient ischemic attack (TIA), and cerebral infarction without residual deficits: Secondary | ICD-10-CM | POA: Diagnosis not present

## 2023-03-20 DIAGNOSIS — M5412 Radiculopathy, cervical region: Secondary | ICD-10-CM | POA: Diagnosis not present

## 2023-03-20 DIAGNOSIS — J9611 Chronic respiratory failure with hypoxia: Secondary | ICD-10-CM

## 2023-03-20 NOTE — Telephone Encounter (Signed)
Ron Parker called from [facility] wanting Rx for a 3 in 1 potty chair for the pt.   Best call number for Ron Parker is [phone]

## 2023-03-20 NOTE — Telephone Encounter (Signed)
Ok done hardcopy to [facility] 

## 2023-03-21 ENCOUNTER — Telehealth: Payer: Self-pay | Admitting: Neurology

## 2023-03-21 NOTE — Telephone Encounter (Signed)
Please call pt's daughter [phone] to double book the pt per Dr. Terrace Arabia to go over results in a Mychart Visit

## 2023-03-22 ENCOUNTER — Telehealth: Payer: Medicare Other | Admitting: Neurology

## 2023-03-22 NOTE — Telephone Encounter (Signed)
Order faxed to [facility]

## 2023-03-26 ENCOUNTER — Other Ambulatory Visit: Payer: Self-pay | Admitting: Internal Medicine

## 2023-03-30 NOTE — Telephone Encounter (Signed)
Called patient and spoke with daughter Katrina who schedule appointments. Advise need to reschedule patient appointment on 12/12 to the following day 12/13.Marland Kitchen appointments confirmed.

## 2023-03-31 ENCOUNTER — Other Ambulatory Visit: Payer: Self-pay | Admitting: Internal Medicine

## 2023-04-06 ENCOUNTER — Encounter: Payer: Self-pay | Admitting: Internal Medicine

## 2023-04-06 ENCOUNTER — Ambulatory Visit (HOSPITAL_COMMUNITY): Payer: Medicare Other | Attending: Cardiology

## 2023-04-06 DIAGNOSIS — I5032 Chronic diastolic (congestive) heart failure: Secondary | ICD-10-CM | POA: Insufficient documentation

## 2023-04-06 LAB — ECHOCARDIOGRAM COMPLETE
AR max vel: 2.82 cm2
AV Area VTI: 2.82 cm2
AV Area mean vel: 2.78 cm2
AV Mean grad: 8 mm[Hg]
AV Peak grad: 14.8 mm[Hg]
Ao pk vel: 1.92 m/s
Area-P 1/2: 3.45 cm2
S' Lateral: 2.3 cm

## 2023-04-07 ENCOUNTER — Other Ambulatory Visit: Payer: Self-pay | Admitting: Internal Medicine

## 2023-04-09 ENCOUNTER — Other Ambulatory Visit: Payer: Self-pay

## 2023-04-09 NOTE — Telephone Encounter (Signed)
Ok to send the patient notes to which the patient refers to synapse.   thanks

## 2023-04-11 ENCOUNTER — Telehealth (INDEPENDENT_AMBULATORY_CARE_PROVIDER_SITE_OTHER): Payer: Medicare Other | Admitting: Neurology

## 2023-04-11 DIAGNOSIS — I679 Cerebrovascular disease, unspecified: Secondary | ICD-10-CM

## 2023-04-11 DIAGNOSIS — R519 Headache, unspecified: Secondary | ICD-10-CM | POA: Diagnosis not present

## 2023-04-11 DIAGNOSIS — M542 Cervicalgia: Secondary | ICD-10-CM

## 2023-04-11 NOTE — Progress Notes (Signed)
ASSESSMENT AND PLAN  Wendy Torres is a 82 y.o. female   New onset persistent headache Cerebrovascular disease  ESR C-reactive protein was elevated, on repeat test, normalization of C-reactive protein, improvement of ESR, which can be explained by her COPD, CT of the chest in October 2024 showed right lower lobe inflammation versus atelectasis emphysema, she denies jaw claudication, not enough evidence to support temporal arteritis  Her headache has much improved,   Cerebrovascular disease  MRI of the brain showed moderate small vessel disease  Echocardiogram showed ejection fraction 70 to 75%, hypertrophy,  Complete evaluation with ultrasound of carotid artery Neck pain, MRI of cervical spine showed multilevel degenerative disease, moderate canal stenosis C3-4 variable degree of foraminal narrowing, Suggested neck stretching exercises, warm compression, Referred to physical therapy     DIAGNOSTIC DATA (LABS, IMAGING, TESTING) - I reviewed patient records, labs, notes, testing and imaging myself where available.   MEDICAL HISTORY:  Wendy Torres, is a 82 year old female, accompanied by her daughter Katrina seen in request by her primary care doctor Corwin Levins, for evaluation of constant headache, initial evaluation February 23, 2023  History is obtained from the patient and review of electronic medical records. I personally reviewed pertinent available imaging films in PACS.   PMHx of  COPD CHF Chronic insomnia HTN Anxiety HLD Previous smoker Stroke Hx of bilateral Hip replacement Chronic knee pain,   She lives at home with her family, significant breathing difficulty, rely on oxygen 24/7, also complains of significant gait abnormality due to bilateral knee pain, came in wheelchair today,  She denies a previous history of headache, since beginning of 2024, she has been complaining daily headaches, mainly on the right side, starting at the right occipital region,  also involving right lower jaw, constant achy pain, pressure, sometimes radiating pain to right lower jaw, no chewing difficulty, she has been wearing denture for many years,  She does have chronic neck pain, radiating pain to right shoulder  CT head without contrast August 2024 showed generalized atrophy extensive periventricular white matter disease  Lab in 2024, ferritin 17.7, iron level was decreased 32, hemoglobin of 9.7, is under the care of hematologist, CMP showed low potassium 3.2   Virtual Visit via video UPDATE April 11, 2023 management by telemedicine and the availability of in person appointments. The patient expressed understanding and agreed to proceed  Location: Provider: [facility] office; Patient: Home with her daughter  I connected with Jeffie Pollock  on April 11, 2023 by a video enabled telemedicine application and verified that I am speaking with the correct person using two identifiers.  UPDATED HiSTORY Over the past few months, her headache has improved, continue to have shoulder pain radiating to bilateral shoulder, gait abnormality, rely on her walker, COPD, oxygen 24/7, limited stamina  We screen shared and reviewed MRI of the brain from March 17, 2023 mild generalized atrophy, extensive moderate small vessel disease,  MRI of cervical spine showed multilevel degenerative changes, moderate canal stenosis at C3-4, variable degree of foraminal narrowing  Echocardiogram Showed ejection fraction 70 to 75%, no regional wall motion abnormality, asymmetric hypertrophy,   Observations/Objective: I have reviewed problem lists, medications, allergies.  Awake, alert, oriented to history taking and casual conversation, using nasal oxygen cannula, facial symmetric, moving 4 extremities without difficulty  REVIEW OF SYSTEMS:  Full 14 system review of systems performed and notable only for as above All other review of systems were negative.   ALLERGIES: Allergies  Allergen Reactions   Chlorthalidone Shortness Of Breath    HOME MEDICATIONS: Current Outpatient Medications  Medication Sig Dispense Refill   acetaminophen (TYLENOL) 325 MG tablet Take 2 tablets (650 mg total) by mouth every 6 (six) hours as needed for mild pain (or Fever >/= 101). (Patient taking differently: Take 650 mg by mouth every 6 (six) hours as needed for mild pain (pain score 1-3).) 40 tablet 0   albuterol (VENTOLIN HFA) 108 (90 Base) MCG/ACT inhaler INHALE 2 PUFFS INTO THE LUNGS EVERY 4 HOURS AS NEEDED FOR WHEEZING OR SHORTNESS OF BREATH. (Patient taking differently: Inhale 2 puffs into the lungs as needed for wheezing or shortness of breath.) 8.5 each 1   ALPRAZolam (XANAX) 0.25 MG tablet TAKE 1 TABLET BY MOUTH TWICE A DAY AS NEEDED 60 tablet 2   amLODipine (NORVASC) 10 MG tablet Take 1 tablet (10 mg total) by mouth daily. 90 tablet 3   aspirin EC 81 MG tablet Take 1 tablet (81 mg total) by mouth daily. Swallow whole.     cloNIDine (CATAPRES) 0.2 MG tablet Take 1 tablet (0.2 mg total) by mouth 2 (two) times daily. 180 tablet 3   Fexofenadine HCl (MUCINEX ALLERGY PO) Take 1 tablet by mouth as needed (congestion). (Patient not taking: Reported on 02/28/2023)     fluocinolone (SYNALAR) 0.01 % external solution APPLY TOPICALLY 2 TIMES DAILY AS NEEDED. 60 mL 0   furosemide (LASIX) 40 MG tablet Take 1 tablet (40 mg total) by mouth daily. 90 tablet 3   gabapentin (NEURONTIN) 600 MG tablet Take 1 tablet (600 mg total) by mouth 3 (three) times daily. 270 tablet 1   guaiFENesin (MUCINEX) 600 MG 12 hr tablet Take 1 tablet (600 mg total) by mouth 2 (two) times daily. 30 tablet 0   hydrALAZINE (APRESOLINE) 100 MG tablet Take 1 tablet (100 mg total) by mouth 3 (three) times daily. 270 tablet 3   iron polysaccharides (NU-IRON) 150 MG capsule Take 1 capsule (150 mg total) by mouth daily. 90 capsule 1   lovastatin (MEVACOR) 40 MG tablet TAKE 1 TABLET BY MOUTH AT  BEDTIME 100 tablet 2   meclizine  (ANTIVERT) 12.5 MG tablet TAKE 1 TABLET BY MOUTH 3 TIMES A DAY AS NEEDED FOR DIZZINESS (Patient taking differently: Take 12.5 mg by mouth 3 (three) times daily as needed for dizziness or nausea.) 30 tablet 2   nystatin (MYCOSTATIN) 100000 UNIT/ML suspension Swish and spit 5mL two times daily after using Symbicort. (Patient taking differently: Use as directed 5 mLs in the mouth or throat in the morning and at bedtime. Swish and spit) 473 mL 5   pantoprazole (PROTONIX) 40 MG tablet TAKE 1 TABLET BY MOUTH DAILY 90 tablet 3   polyethylene glycol (MIRALAX / GLYCOLAX) 17 g packet Take 17 g by mouth daily as needed for mild constipation or moderate constipation.     potassium chloride SA (KLOR-CON M20) 20 MEQ tablet Take 1 tablet (20 mEq total) by mouth daily. 90 tablet 3   predniSONE (DELTASONE) 10 MG tablet Take 20mg  daily for 3days,Take 10mg  daily for 3days, then stop 10 tablet 0   solifenacin (VESICARE) 10 MG tablet Take 1 tablet (10 mg total) by mouth daily. 90 tablet 1   tiZANidine (ZANAFLEX) 4 MG tablet TAKE 1 TABLET BY MOUTH EVERY 6 HOURS AS NEEDED FOR MUSCLE SPASMS. 50 tablet 1   traMADol (ULTRAM) 50 MG tablet Take 1 tablet (50 mg total) by mouth every 6 (six) hours as needed. for pain (  Patient taking differently: Take 50 mg by mouth in the morning and at bedtime.) 120 tablet 2   traZODone (DESYREL) 50 MG tablet TAKE 1/2 TO 1 TABLET BY MOUTH AT BEDTIME AS NEEDED FOR SLEEP (Patient taking differently: Take 25-50 mg by mouth at bedtime as needed for sleep.) 90 tablet 1   TRELEGY ELLIPTA 200-62.5-25 MCG/ACT AEPB TAKE 1 PUFF BY MOUTH EVERY DAY 60 each 2   triamcinolone cream (KENALOG) 0.1 % APPLY TO AFFECTED AREA TWICE A DAY (Patient taking differently: Apply 1 Application topically as needed (eczema).) 30 g 1   valsartan (DIOVAN) 160 MG tablet Take 1 tablet (160 mg total) by mouth daily. 90 tablet 3   No current facility-administered medications for this visit.    PAST MEDICAL HISTORY: Past Medical  History:  Diagnosis Date   ABNORMAL CHEST XRAY 11/05/2008   ACUTE URIS OF UNSPECIFIED SITE 10/22/2007   ALLERGIC RHINITIS 12/09/2006   ANEMIA 01/14/2009   Anxiety    Arthritis    ASTHMA 12/09/2006   Asthma    Cataract    bilateral -removed   CHF (congestive heart failure) (HCC)    CHF (congestive heart failure) (HCC)    Chronic pain syndrome 02/16/2009   COPD 12/09/2006   DEGENERATIVE JOINT DISEASE 12/09/2006   DIZZINESS 03/30/2010   DYSPNEA 01/27/2010   with heavy exertion   FREQUENCY, URINARY 03/30/2010   GERD 12/09/2006   Glossitis 01/14/2009   HELICOBACTER PYLORI GASTRITIS, HX OF 12/09/2006   HIATAL HERNIA 03/01/2010   History of hiatal hernia    HYPERLIPIDEMIA 04/18/2007   HYPERSOMNIA 08/31/2008   HYPERTENSION 12/09/2006   INSOMNIA-SLEEP DISORDER-UNSPEC 11/05/2008   LOW BACK PAIN 12/09/2006   Morbid obesity (HCC) 12/09/2006   Nonspecific (abnormal) findings on radiological and other examination of body structure 11/05/2008   OSTEOPOROSIS 12/09/2006   OVERACTIVE BLADDER 04/14/2008   PALPITATIONS, RECURRENT 01/27/2010   Stroke (HCC)    pt. stated light one years ago   SYNCOPE 11/05/2008   TRANSIENT ISCHEMIC ATTACK, HX OF 12/09/2006    PAST SURGICAL HISTORY: Past Surgical History:  Procedure Laterality Date   BIOPSY  01/18/2023   Procedure: BIOPSY;  Surgeon: Meryl Dare, MD;  Location: [facility];  Service: Gastroenterology;;   COLONOSCOPY     ESOPHAGOGASTRODUODENOSCOPY (EGD) WITH PROPOFOL N/A 01/18/2023   Procedure: ESOPHAGOGASTRODUODENOSCOPY (EGD) WITH PROPOFOL;  Surgeon: Meryl Dare, MD;  Location: [facility];  Service: Gastroenterology;  Laterality: N/A;   HOT HEMOSTASIS N/A 01/18/2023   Procedure: HOT HEMOSTASIS (ARGON PLASMA COAGULATION/BICAP);  Surgeon: Meryl Dare, MD;  Location: [facility];  Service: Gastroenterology;  Laterality: N/A;   LUMBAR DISC SURGERY     TONSILLECTOMY     as a child   TOTAL HIP ARTHROPLASTY Right 11/06/2012    Procedure: RIGHT TOTAL HIP ARTHROPLASTY WITH ACETABULAR AUTOGRAFT;  Surgeon: Loanne Drilling, MD;  Location: [facility];  Service: Orthopedics;  Laterality: Right;   TOTAL HIP ARTHROPLASTY Left 02/18/2014   Procedure: LEFT TOTAL HIP ARTHROPLASTY;  Surgeon: Loanne Drilling, MD;  Location: [facility];  Service: Orthopedics;  Laterality: Left;   TUBAL LIGATION      FAMILY HISTORY: Family History  Problem Relation Age of Onset   Heart disease Mother        Had pacemaker   Lung cancer Father    Hypertension Brother    Diabetes Neg Hx    Colon cancer Neg Hx    Colon polyps Neg Hx    Kidney disease Neg Hx  Gallbladder disease Neg Hx    Esophageal cancer Neg Hx    Allergic rhinitis Neg Hx    Angioedema Neg Hx    Asthma Neg Hx    Atopy Neg Hx    Eczema Neg Hx    Immunodeficiency Neg Hx    Urticaria Neg Hx     SOCIAL HISTORY: Social History   Socioeconomic History   Marital status: Widowed    Spouse name: Not on file   Number of children: 10   Years of education: Not on file   Highest education level: Not on file  Occupational History   Occupation: Retired  Tobacco Use   Smoking status: Former    Current packs/day: 0.00    Average packs/day: 0.5 packs/day for 25.0 years (12.5 ttl pk-yrs)    Types: Cigarettes    Start date: 10/30/1947    Quit date: 10/29/1972    Years since quitting: 50.4   Smokeless tobacco: Never  Vaping Use   Vaping status: Never Used  Substance and Sexual Activity   Alcohol use: No    Alcohol/week: 0.0 standard drinks of alcohol   Drug use: No   Sexual activity: Not Currently  Other Topics Concern   Not on file  Social History Narrative   Right handed    Wears readers   Drinks 1 cup of coffee in am   Drinks soda 1 per day.   Social Determinants of Health   Financial Resource Strain: Low Risk  (06/23/2022)   Overall Financial Resource Strain (CARDIA)    Difficulty of Paying Living Expenses: Not hard at all  Food Insecurity: No Food Insecurity  (01/15/2023)   Hunger Vital Sign    Worried About Running Out of Food in the Last Year: Never true    Ran Out of Food in the Last Year: Never true  Transportation Needs: No Transportation Needs (01/15/2023)   PRAPARE - Administrator, Civil Service (Medical): No    Lack of Transportation (Non-Medical): No  Physical Activity: Inactive (06/23/2022)   Exercise Vital Sign    Days of Exercise per Week: 0 days    Minutes of Exercise per Session: 0 min  Stress: No Stress Concern Present (06/23/2022)   [employer] of Occupational Health - Occupational Stress Questionnaire    Feeling of Stress : Not at all  Social Connections: Moderately Integrated (06/23/2022)   Social Connection and Isolation Panel [NHANES]    Frequency of Communication with Friends and Family: More than three times a week    Frequency of Social Gatherings with Friends and Family: More than three times a week    Attends Religious Services: More than 4 times per year    Active Member of [employer] or Organizations: No    Attends Engineer, structural: More than 4 times per year    Marital Status: Widowed  Intimate Partner Violence: Not At Risk (01/15/2023)   Humiliation, Afraid, Rape, and Kick questionnaire    Fear of Current or Ex-Partner: No    Emotionally Abused: No    Physically Abused: No    Sexually Abused: No      Levert Feinstein, M.D. Ph.D.  [facility] [address] [place], [place] [address] Ph: [phone] Fax: [phone]  CC:  Corwin Levins, MD [address] [place],  [place] [address]  Corwin Levins, MD

## 2023-04-12 ENCOUNTER — Encounter: Payer: Medicare Other | Admitting: Oncology

## 2023-04-12 ENCOUNTER — Other Ambulatory Visit: Payer: Medicare Other

## 2023-04-16 ENCOUNTER — Other Ambulatory Visit: Payer: Self-pay

## 2023-04-16 MED ORDER — VALSARTAN 160 MG PO TABS: 160.0000 mg | ORAL_TABLET | Freq: Every day | ORAL | 3 refills | Status: DC

## 2023-04-19 ENCOUNTER — Other Ambulatory Visit: Payer: Self-pay | Admitting: Oncology

## 2023-04-19 ENCOUNTER — Inpatient Hospital Stay: Payer: Medicare Other | Attending: Oncology | Admitting: Oncology

## 2023-04-19 ENCOUNTER — Encounter: Payer: Self-pay | Admitting: Oncology

## 2023-04-19 ENCOUNTER — Inpatient Hospital Stay: Payer: Medicare Other

## 2023-04-19 VITALS — BP 168/63 | HR 69 | Temp 97.2°F | Resp 18 | Wt 202.5 lb

## 2023-04-19 DIAGNOSIS — I110 Hypertensive heart disease with heart failure: Secondary | ICD-10-CM | POA: Insufficient documentation

## 2023-04-19 DIAGNOSIS — Z7982 Long term (current) use of aspirin: Secondary | ICD-10-CM | POA: Insufficient documentation

## 2023-04-19 DIAGNOSIS — Z79899 Other long term (current) drug therapy: Secondary | ICD-10-CM | POA: Insufficient documentation

## 2023-04-19 DIAGNOSIS — Z7952 Long term (current) use of systemic steroids: Secondary | ICD-10-CM | POA: Diagnosis not present

## 2023-04-19 DIAGNOSIS — I509 Heart failure, unspecified: Secondary | ICD-10-CM | POA: Insufficient documentation

## 2023-04-19 DIAGNOSIS — Z7951 Long term (current) use of inhaled steroids: Secondary | ICD-10-CM | POA: Diagnosis not present

## 2023-04-19 DIAGNOSIS — D509 Iron deficiency anemia, unspecified: Secondary | ICD-10-CM

## 2023-04-19 DIAGNOSIS — Z87891 Personal history of nicotine dependence: Secondary | ICD-10-CM | POA: Insufficient documentation

## 2023-04-19 LAB — CBC WITH DIFFERENTIAL/PLATELET
Abs Immature Granulocytes: 0.01 10*3/uL (ref 0.00–0.07)
Basophils Absolute: 0.1 10*3/uL (ref 0.0–0.1)
Basophils Relative: 1 %
Eosinophils Absolute: 0.1 10*3/uL (ref 0.0–0.5)
Eosinophils Relative: 2 %
HCT: 28.8 % — ABNORMAL LOW (ref 36.0–46.0)
Hemoglobin: 8.8 g/dL — ABNORMAL LOW (ref 12.0–15.0)
Immature Granulocytes: 0 %
Lymphocytes Relative: 27 %
Lymphs Abs: 1.9 10*3/uL (ref 0.7–4.0)
MCH: 24 pg — ABNORMAL LOW (ref 26.0–34.0)
MCHC: 30.6 g/dL (ref 30.0–36.0)
MCV: 78.7 fL — ABNORMAL LOW (ref 80.0–100.0)
Monocytes Absolute: 0.9 10*3/uL (ref 0.1–1.0)
Monocytes Relative: 12 %
Neutro Abs: 4.2 10*3/uL (ref 1.7–7.7)
Neutrophils Relative %: 58 %
Platelets: 371 10*3/uL (ref 150–400)
RBC: 3.66 MIL/uL — ABNORMAL LOW (ref 3.87–5.11)
RDW: 15.7 % — ABNORMAL HIGH (ref 11.5–15.5)
WBC: 7.1 10*3/uL (ref 4.0–10.5)
nRBC: 0 % (ref 0.0–0.2)

## 2023-04-19 LAB — COMPREHENSIVE METABOLIC PANEL
ALT: 8 U/L (ref 0–44)
AST: 15 U/L (ref 15–41)
Albumin: 4.3 g/dL (ref 3.5–5.0)
Alkaline Phosphatase: 76 U/L (ref 38–126)
Anion gap: 6 (ref 5–15)
BUN: 15 mg/dL (ref 8–23)
CO2: 33 mmol/L — ABNORMAL HIGH (ref 22–32)
Calcium: 10.9 mg/dL — ABNORMAL HIGH (ref 8.9–10.3)
Chloride: 97 mmol/L — ABNORMAL LOW (ref 98–111)
Creatinine, Ser: 1.01 mg/dL — ABNORMAL HIGH (ref 0.44–1.00)
GFR, Estimated: 55 mL/min — ABNORMAL LOW (ref >60)
Glucose, Bld: 94 mg/dL (ref 70–99)
Potassium: 3.9 mmol/L (ref 3.5–5.1)
Sodium: 136 mmol/L (ref 135–145)
Total Bilirubin: 0.3 mg/dL (ref <1.2)
Total Protein: 8.1 g/dL (ref 6.5–8.1)

## 2023-04-19 LAB — IRON AND IRON BINDING CAPACITY (CC-WL,HP ONLY)
Iron: 23 ug/dL — ABNORMAL LOW (ref 28–170)
Saturation Ratios: 5 % — ABNORMAL LOW (ref 10.4–31.8)
TIBC: 452 ug/dL — ABNORMAL HIGH (ref 250–450)
UIBC: 429 ug/dL (ref 148–442)

## 2023-04-19 LAB — FOLATE: Folate: 10.1 ng/mL (ref >5.9)

## 2023-04-19 LAB — VITAMIN B12: Vitamin B-12: 252 pg/mL (ref 180–914)

## 2023-04-19 LAB — FERRITIN: Ferritin: 8 ng/mL — ABNORMAL LOW (ref 11–307)

## 2023-04-19 LAB — TSH: TSH: 1.588 u[IU]/mL (ref 0.350–4.500)

## 2023-04-19 LAB — LACTATE DEHYDROGENASE: LDH: 165 U/L (ref 98–192)

## 2023-04-19 NOTE — Assessment & Plan Note (Signed)
-  She has had intermittent hypercalcemia with calcium of 10.6 previously.  Calcium was 10.9 today.  In the context of anemia, we will check SPEP, IFE, serum free light chains to rule out any underlying monoclonal gammopathy.  Will follow-up on the results and inform patient.

## 2023-04-19 NOTE — Progress Notes (Signed)
[facility]  HEMATOLOGY CLINIC CONSULTATION NOTE    PATIENT NAME: Wendy Torres   MR#: [id number] DOB: 1940-10-06  DATE OF SERVICE: 04/19/2023  Patient Care Team: Corwin Levins, MD as PCP - General Thomasene Ripple, DO as PCP - Cardiology (Cardiology) Lysle Rubens, OT as Occupational Therapist (Occupational Therapy) Rodrigo Ran, OD as Consulting Physician (Ophthalmology) Doree Albee, PA-C (Gastroenterology)  REASON FOR CONSULTATION/ CHIEF COMPLAINT:  Evaluation of anemia.  HISTORY OF PRESENT ILLNESS:  Wendy Torres is a 82 y.o. lady with a past medical history of hypertension, dyslipidemia, CHF, COPD, arthritis, H. pylori gastritis, was referred to our service for evaluation of anemia.    Discussed the use of AI scribe software for clinical note transcription with the patient, who gave verbal consent to proceed.   On 02/08/2023, labs at her PCPs office showed hemoglobin of 9.1, hematocrit of 31.4, MCV 80.7.  White count was 5800, platelet count 419,000.  Iron studies showed evidence of iron deficiency with iron saturation of 8%, iron decreased at 32.  Ferritin was borderline low at 17.7.  She was referred to [place] for further management of iron deficiency anemia, which was not resolving with oral iron supplementation.  The patient's hemoglobin levels have been fluctuating for several years, with the lowest recorded value being 8.6 in 2015. The patient's current hemoglobin level is around 10, which is slightly below the normal range. The patient denies any known blood loss and has not had any iron infusions or blood transfusions. The patient is currently taking iron supplements daily. The patient also reports experiencing headaches, which have been persistent. The patient has been on oxygen therapy for COPD for about three years and reports feeling lightheaded upon standing and shortness of breath when walking. The patient quit smoking over forty years  ago.   MEDICAL HISTORY:  Past Medical History:  Diagnosis Date   ALLERGIC RHINITIS 12/09/2006   ANEMIA 01/14/2009   Anxiety    Arthritis    ASTHMA 12/09/2006   Cataract    bilateral -removed   CHF (congestive heart failure) (HCC)    Chronic pain syndrome 02/16/2009   COPD 12/09/2006   DEGENERATIVE JOINT DISEASE 12/09/2006   DIZZINESS 03/30/2010   DYSPNEA 01/27/2010   with heavy exertion   FREQUENCY, URINARY 03/30/2010   GERD 12/09/2006   HELICOBACTER PYLORI GASTRITIS, HX OF 12/09/2006   HIATAL HERNIA 03/01/2010   HYPERLIPIDEMIA 04/18/2007   HYPERSOMNIA 08/31/2008   HYPERTENSION 12/09/2006   INSOMNIA-SLEEP DISORDER-UNSPEC 11/05/2008   LOW BACK PAIN 12/09/2006   Morbid obesity (HCC) 12/09/2006   Nonspecific (abnormal) findings on radiological and other examination of body structure 11/05/2008   OSTEOPOROSIS 12/09/2006   OVERACTIVE BLADDER 04/14/2008   PALPITATIONS, RECURRENT 01/27/2010   Stroke (HCC)    pt. stated light one years ago   SYNCOPE 11/05/2008   TRANSIENT ISCHEMIC ATTACK, HX OF 12/09/2006    SURGICAL HISTORY: Past Surgical History:  Procedure Laterality Date   BIOPSY  01/18/2023   Procedure: BIOPSY;  Surgeon: Meryl Dare, MD;  Location: [facility];  Service: Gastroenterology;;   COLONOSCOPY     ESOPHAGOGASTRODUODENOSCOPY (EGD) WITH PROPOFOL N/A 01/18/2023   Procedure: ESOPHAGOGASTRODUODENOSCOPY (EGD) WITH PROPOFOL;  Surgeon: Meryl Dare, MD;  Location: [facility];  Service: Gastroenterology;  Laterality: N/A;   HOT HEMOSTASIS N/A 01/18/2023   Procedure: HOT HEMOSTASIS (ARGON PLASMA COAGULATION/BICAP);  Surgeon: Meryl Dare, MD;  Location: [facility];  Service: Gastroenterology;  Laterality: N/A;   LUMBAR DISC  SURGERY     TONSILLECTOMY     as a child   TOTAL HIP ARTHROPLASTY Right 11/06/2012   Procedure: RIGHT TOTAL HIP ARTHROPLASTY WITH ACETABULAR AUTOGRAFT;  Surgeon: Loanne Drilling, MD;  Location: [facility];  Service: Orthopedics;   Laterality: Right;   TOTAL HIP ARTHROPLASTY Left 02/18/2014   Procedure: LEFT TOTAL HIP ARTHROPLASTY;  Surgeon: Loanne Drilling, MD;  Location: [facility];  Service: Orthopedics;  Laterality: Left;   TUBAL LIGATION      SOCIAL HISTORY: She reports that she quit smoking about 50 years ago. Her smoking use included cigarettes. She started smoking about 75 years ago. She has a 12.5 pack-year smoking history. She has never used smokeless tobacco. She reports that she does not drink alcohol and does not use drugs. Social History   Socioeconomic History   Marital status: Widowed    Spouse name: Not on file   Number of children: 10   Years of education: Not on file   Highest education level: Not on file  Occupational History   Occupation: Retired  Tobacco Use   Smoking status: Former    Current packs/day: 0.00    Average packs/day: 0.5 packs/day for 25.0 years (12.5 ttl pk-yrs)    Types: Cigarettes    Start date: 10/30/1947    Quit date: 10/29/1972    Years since quitting: 50.5   Smokeless tobacco: Never  Vaping Use   Vaping status: Never Used  Substance and Sexual Activity   Alcohol use: No    Alcohol/week: 0.0 standard drinks of alcohol   Drug use: No   Sexual activity: Not Currently  Other Topics Concern   Not on file  Social History Narrative   Right handed    Wears readers   Drinks 1 cup of coffee in am   Drinks soda 1 per day.   Social Drivers of Corporate investment banker Strain: Low Risk  (06/23/2022)   Overall Financial Resource Strain (CARDIA)    Difficulty of Paying Living Expenses: Not hard at all  Food Insecurity: No Food Insecurity (01/15/2023)   Hunger Vital Sign    Worried About Running Out of Food in the Last Year: Never true    Ran Out of Food in the Last Year: Never true  Transportation Needs: No Transportation Needs (01/15/2023)   PRAPARE - Administrator, Civil Service (Medical): No    Lack of Transportation (Non-Medical): No  Physical Activity:  Inactive (06/23/2022)   Exercise Vital Sign    Days of Exercise per Week: 0 days    Minutes of Exercise per Session: 0 min  Stress: No Stress Concern Present (06/23/2022)   [employer] of Occupational Health - Occupational Stress Questionnaire    Feeling of Stress : Not at all  Social Connections: Moderately Integrated (06/23/2022)   Social Connection and Isolation Panel [NHANES]    Frequency of Communication with Friends and Family: More than three times a week    Frequency of Social Gatherings with Friends and Family: More than three times a week    Attends Religious Services: More than 4 times per year    Active Member of [employer] or Organizations: No    Attends Engineer, structural: More than 4 times per year    Marital Status: Widowed  Intimate Partner Violence: Not At Risk (01/15/2023)   Humiliation, Afraid, Rape, and Kick questionnaire    Fear of Current or Ex-Partner: No    Emotionally Abused: No  Physically Abused: No    Sexually Abused: No    FAMILY HISTORY: Family History  Problem Relation Age of Onset   Heart disease Mother        Had pacemaker   Lung cancer Father    Hypertension Brother    Diabetes Neg Hx    Colon cancer Neg Hx    Colon polyps Neg Hx    Kidney disease Neg Hx    Gallbladder disease Neg Hx    Esophageal cancer Neg Hx    Allergic rhinitis Neg Hx    Angioedema Neg Hx    Asthma Neg Hx    Atopy Neg Hx    Eczema Neg Hx    Immunodeficiency Neg Hx    Urticaria Neg Hx     ALLERGIES:  She is allergic to chlorthalidone.  MEDICATIONS:  Current Outpatient Medications  Medication Sig Dispense Refill   acetaminophen (TYLENOL) 325 MG tablet Take 2 tablets (650 mg total) by mouth every 6 (six) hours as needed for mild pain (or Fever >/= 101). (Patient taking differently: Take 650 mg by mouth every 6 (six) hours as needed for mild pain (pain score 1-3).) 40 tablet 0   albuterol (VENTOLIN HFA) 108 (90 Base) MCG/ACT inhaler INHALE 2 PUFFS INTO  THE LUNGS EVERY 4 HOURS AS NEEDED FOR WHEEZING OR SHORTNESS OF BREATH. (Patient taking differently: Inhale 2 puffs into the lungs as needed for wheezing or shortness of breath.) 8.5 each 1   ALPRAZolam (XANAX) 0.25 MG tablet TAKE 1 TABLET BY MOUTH TWICE A DAY AS NEEDED 60 tablet 2   amLODipine (NORVASC) 10 MG tablet Take 1 tablet (10 mg total) by mouth daily. 90 tablet 3   aspirin EC 81 MG tablet Take 1 tablet (81 mg total) by mouth daily. Swallow whole.     cloNIDine (CATAPRES) 0.2 MG tablet Take 1 tablet (0.2 mg total) by mouth 2 (two) times daily. 180 tablet 3   Fexofenadine HCl (MUCINEX ALLERGY PO) Take 1 tablet by mouth as needed (congestion).     fluocinolone (SYNALAR) 0.01 % external solution APPLY TOPICALLY 2 TIMES DAILY AS NEEDED. 60 mL 0   furosemide (LASIX) 40 MG tablet Take 1 tablet (40 mg total) by mouth daily. 90 tablet 3   gabapentin (NEURONTIN) 600 MG tablet Take 1 tablet (600 mg total) by mouth 3 (three) times daily. 270 tablet 1   guaiFENesin (MUCINEX) 600 MG 12 hr tablet Take 1 tablet (600 mg total) by mouth 2 (two) times daily. 30 tablet 0   hydrALAZINE (APRESOLINE) 100 MG tablet Take 1 tablet (100 mg total) by mouth 3 (three) times daily. 270 tablet 3   iron polysaccharides (NU-IRON) 150 MG capsule Take 1 capsule (150 mg total) by mouth daily. 90 capsule 1   lovastatin (MEVACOR) 40 MG tablet TAKE 1 TABLET BY MOUTH AT  BEDTIME 100 tablet 2   meclizine (ANTIVERT) 12.5 MG tablet TAKE 1 TABLET BY MOUTH 3 TIMES A DAY AS NEEDED FOR DIZZINESS (Patient taking differently: Take 12.5 mg by mouth 3 (three) times daily as needed for dizziness or nausea.) 30 tablet 2   nystatin (MYCOSTATIN) 100000 UNIT/ML suspension Swish and spit 5mL two times daily after using Symbicort. (Patient taking differently: Use as directed 5 mLs in the mouth or throat in the morning and at bedtime. Swish and spit) 473 mL 5   pantoprazole (PROTONIX) 40 MG tablet TAKE 1 TABLET BY MOUTH DAILY 90 tablet 3    polyethylene glycol (MIRALAX / GLYCOLAX) 17  g packet Take 17 g by mouth daily as needed for mild constipation or moderate constipation.     potassium chloride SA (KLOR-CON M20) 20 MEQ tablet Take 1 tablet (20 mEq total) by mouth daily. 90 tablet 3   solifenacin (VESICARE) 10 MG tablet Take 1 tablet (10 mg total) by mouth daily. 90 tablet 1   tiZANidine (ZANAFLEX) 4 MG tablet TAKE 1 TABLET BY MOUTH EVERY 6 HOURS AS NEEDED FOR MUSCLE SPASMS. 50 tablet 1   traMADol (ULTRAM) 50 MG tablet Take 1 tablet (50 mg total) by mouth every 6 (six) hours as needed. for pain (Patient taking differently: Take 50 mg by mouth in the morning and at bedtime.) 120 tablet 2   traZODone (DESYREL) 50 MG tablet TAKE 1/2 TO 1 TABLET BY MOUTH AT BEDTIME AS NEEDED FOR SLEEP (Patient taking differently: Take 25-50 mg by mouth at bedtime as needed for sleep.) 90 tablet 1   TRELEGY ELLIPTA 200-62.5-25 MCG/ACT AEPB TAKE 1 PUFF BY MOUTH EVERY DAY 60 each 2   triamcinolone cream (KENALOG) 0.1 % APPLY TO AFFECTED AREA TWICE A DAY (Patient taking differently: Apply 1 Application topically as needed (eczema).) 30 g 1   valsartan (DIOVAN) 160 MG tablet Take 1 tablet (160 mg total) by mouth daily. 90 tablet 3   predniSONE (DELTASONE) 10 MG tablet Take 20mg  daily for 3days,Take 10mg  daily for 3days, then stop 10 tablet 0   No current facility-administered medications for this visit.    REVIEW OF SYSTEMS:    Review of Systems - Oncology  All other pertinent systems were reviewed and were negative except as mentioned above.  PHYSICAL EXAMINATION:  ECOG PERFORMANCE STATUS: 1 - Symptomatic but completely ambulatory  Vitals:   04/19/23 0131  BP: (!) 168/63  Pulse: 69  Resp: 18  Temp: (!) 97.2 F (36.2 C)  SpO2: 98%   Filed Weights   04/19/23 0131  Weight: 202 lb 8 oz (91.9 kg)    Physical Exam Constitutional:      General: She is not in acute distress.    Appearance: Normal appearance.  HENT:     Head: Normocephalic  and atraumatic.  Eyes:     General: No scleral icterus.    Conjunctiva/sclera: Conjunctivae normal.  Cardiovascular:     Rate and Rhythm: Normal rate and regular rhythm.     Heart sounds: Normal heart sounds.  Pulmonary:     Effort: Pulmonary effort is normal.     Breath sounds: Normal breath sounds.     Comments: Wearing O2 via nasal cannula Abdominal:     General: There is no distension.  Neurological:     General: No focal deficit present.     Mental Status: She is alert and oriented to person, place, and time.  Psychiatric:        Mood and Affect: Mood normal.        Behavior: Behavior normal.        Thought Content: Thought content normal.     LABORATORY DATA:   I have reviewed the data as listed.  Results for orders placed or performed in visit on 04/19/23  Lactate dehydrogenase  Result Value Ref Range   LDH 165 98 - 192 U/L  Iron and Iron Binding Capacity ([facility] only)  Result Value Ref Range   Iron 23 (L) 28 - 170 ug/dL   TIBC 409 (H) 811 - 914 ug/dL   Saturation Ratios 5 (L) 10.4 - 31.8 %   UIBC 429 148 -  442 ug/dL  Comprehensive metabolic panel  Result Value Ref Range   Sodium 136 135 - 145 mmol/L   Potassium 3.9 3.5 - 5.1 mmol/L   Chloride 97 (L) 98 - 111 mmol/L   CO2 33 (H) 22 - 32 mmol/L   Glucose, Bld 94 70 - 99 mg/dL   BUN 15 8 - 23 mg/dL   Creatinine, Ser 1.61 (H) 0.44 - 1.00 mg/dL   Calcium 09.6 (H) 8.9 - 10.3 mg/dL   Total Protein 8.1 6.5 - 8.1 g/dL   Albumin 4.3 3.5 - 5.0 g/dL   AST 15 15 - 41 U/L   ALT 8 0 - 44 U/L   Alkaline Phosphatase 76 38 - 126 U/L   Total Bilirubin 0.3 <1.2 mg/dL   GFR, Estimated 55 (L) >60 mL/min   Anion gap 6 5 - 15  CBC with Differential/Platelet  Result Value Ref Range   WBC 7.1 4.0 - 10.5 K/uL   RBC 3.66 (L) 3.87 - 5.11 MIL/uL   Hemoglobin 8.8 (L) 12.0 - 15.0 g/dL   HCT 04.5 (L) 40.9 - 81.1 %   MCV 78.7 (L) 80.0 - 100.0 fL   MCH 24.0 (L) 26.0 - 34.0 pg   MCHC 30.6 30.0 - 36.0 g/dL   RDW 91.4 (H) 78.2 -  15.5 %   Platelets 371 150 - 400 K/uL   nRBC 0.0 0.0 - 0.2 %   Neutrophils Relative % 58 %   Neutro Abs 4.2 1.7 - 7.7 K/uL   Lymphocytes Relative 27 %   Lymphs Abs 1.9 0.7 - 4.0 K/uL   Monocytes Relative 12 %   Monocytes Absolute 0.9 0.1 - 1.0 K/uL   Eosinophils Relative 2 %   Eosinophils Absolute 0.1 0.0 - 0.5 K/uL   Basophils Relative 1 %   Basophils Absolute 0.1 0.0 - 0.1 K/uL   Immature Granulocytes 0 %   Abs Immature Granulocytes 0.01 0.00 - 0.07 K/uL      RADIOGRAPHIC STUDIES:  I have personally reviewed the radiological images as listed and agree with the findings in the report.  ECHOCARDIOGRAM COMPLETE Result Date: 04/06/2023    ECHOCARDIOGRAM REPORT   Patient Name:   Wendy Torres Date of Exam: 04/06/2023 Medical Rec #:  [id number]      Height:       64.0 in Accession #:    [id number]     Weight:       205.4 lb Date of Birth:  June 08, 1940      BSA:          1.979 m Patient Age:    82 years       BP:           144/64 mmHg Patient Gender: F              HR:           74 bpm. Exam Location:  [address] Procedure: 2D Echo, Cardiac Doppler and Color Doppler Indications:    I50.32 Chronic Diastolic Heart Failure  History:        Patient has prior history of Echocardiogram examinations, most                 recent 01/04/2021. Stroke and COPD, Signs/Symptoms:Syncope and                 Dyspnea; Risk Factors:Hypertension and Dyslipidemia.  Sonographer:    Daphine Deutscher RDCS Referring Phys: [id number] KARDIE TOBB IMPRESSIONS  1. Left ventricular ejection  fraction, by estimation, is 70 to 75%. The left ventricle has hyperdynamic function. The left ventricle has no regional wall motion abnormalities. There is moderate asymmetric left ventricular hypertrophy of the basal-septal  segment. Left ventricular diastolic parameters are consistent with Grade II diastolic dysfunction (pseudonormalization). Elevated left atrial pressure.  2. Right ventricular systolic function is normal. The right  ventricular size is normal. There is moderately elevated pulmonary artery systolic pressure. The estimated right ventricular systolic pressure is 59.6 mmHg.  3. The mitral valve is normal in structure. Trivial mitral valve regurgitation. No evidence of mitral stenosis.  4. The aortic valve was not well visualized. Aortic valve regurgitation is not visualized. Aortic valve sclerosis is present, with no evidence of aortic valve stenosis.  5. The inferior vena cava is normal in size with greater than 50% respiratory variability, suggesting right atrial pressure of 3 mmHg. FINDINGS  Left Ventricle: Left ventricular ejection fraction, by estimation, is 70 to 75%. The left ventricle has hyperdynamic function. The left ventricle has no regional wall motion abnormalities. The left ventricular internal cavity size was normal in size. There is moderate asymmetric left ventricular hypertrophy of the basal-septal segment. Left ventricular diastolic parameters are consistent with Grade II diastolic dysfunction (pseudonormalization). Elevated left atrial pressure. Right Ventricle: The right ventricular size is normal. No increase in right ventricular wall thickness. Right ventricular systolic function is normal. There is moderately elevated pulmonary artery systolic pressure. The tricuspid regurgitant velocity is 3.76 m/s, and with an assumed right atrial pressure of 3 mmHg, the estimated right ventricular systolic pressure is 59.6 mmHg. Left Atrium: Left atrial size was normal in size. Right Atrium: Right atrial size was normal in size. Pericardium: There is no evidence of pericardial effusion. Mitral Valve: The mitral valve is normal in structure. Trivial mitral valve regurgitation. No evidence of mitral valve stenosis. Tricuspid Valve: The tricuspid valve is normal in structure. Tricuspid valve regurgitation is mild. The aortic valve was not well visualized. Aortic valve regurgitation is not visualized. Aortic valve sclerosis  is present, with no evidence of aortic valve stenosis. Pulmonic Valve: The pulmonic valve was not well visualized. Pulmonic valve regurgitation is trivial. Aorta: The aortic root is normal in size and structure. Venous: The inferior vena cava is normal in size with greater than 50% respiratory variability, suggesting right atrial pressure of 3 mmHg. IAS/Shunts: The interatrial septum was not well visualized.  LEFT VENTRICLE PLAX 2D LVIDd:         4.70 cm   Diastology LVIDs:         2.30 cm   LV e' medial:    6.04 cm/s LV PW:         1.00 cm   LV E/e' medial:  16.4 LV IVS:        1.00 cm   LV e' lateral:   8.98 cm/s LVOT diam:     2.20 cm   LV E/e' lateral: 11.0 LV SV:         122 LV SV Index:   62 LVOT Area:     3.80 cm  RIGHT VENTRICLE             IVC RV Basal diam:  4.20 cm     IVC diam: 1.40 cm RV S prime:     21.20 cm/s TAPSE (M-mode): 2.6 cm LEFT ATRIUM             Index        RIGHT ATRIUM  Index LA diam:        5.20 cm 2.63 cm/m   RA Area:     14.50 cm LA Vol (A2C):   64.1 ml 32.40 ml/m  RA Volume:   34.20 ml  17.28 ml/m LA Vol (A4C):   49.7 ml 25.12 ml/m LA Biplane Vol: 56.4 ml 28.50 ml/m  AORTIC VALVE AV Area (Vmax):    2.82 cm AV Area (Vmean):   2.78 cm AV Area (VTI):     2.82 cm AV Vmax:           192.20 cm/s AV Vmean:          132.800 cm/s AV VTI:            0.434 m AV Peak Grad:      14.8 mmHg AV Mean Grad:      8.0 mmHg LVOT Vmax:         142.50 cm/s LVOT Vmean:        97.000 cm/s LVOT VTI:          0.322 m LVOT/AV VTI ratio: 0.74  AORTA Ao Root diam: 3.20 cm MITRAL VALVE                TRICUSPID VALVE MV Area (PHT): 3.45 cm     TR Peak grad:   56.6 mmHg MV Decel Time: 220 msec     TR Vmax:        376.00 cm/s MV E velocity: 99.00 cm/s MV A velocity: 113.00 cm/s  SHUNTS MV E/A ratio:  0.88         Systemic VTI:  0.32 m                             Systemic Diam: 2.20 cm Epifanio Lesches MD Electronically signed by Epifanio Lesches MD Signature Date/Time: 04/06/2023/6:03:22 PM     Final     ASSESSMENT & PLAN:   82 y.o. lady with a past medical history of hypertension, dyslipidemia, CHF, COPD, arthritis, H. pylori gastritis, was referred to our service for evaluation of anemia.    Iron deficiency anemia Hemoglobin levels have been consistently low for several years, with the lowest recorded value being 8.6 in 2015. No known blood loss. Patient has been on oral iron supplements for an extended period.  -Labs today revealed persistent anemia with hemoglobin of 8.8, hematocrit 28.8, MCV 78.7.  White count and platelet count are within normal limits.  CMP showed creatinine of 1.01, calcium 10.9, total protein 8.1, albumin 4.3.  Iron studies showed evidence of iron deficiency with decreased iron saturation of 5%, decreased iron of 23.  Ferritin pending.  LDH normal.  -B12, folate are also pending.  -Given persistent iron deficiency anemia, despite oral iron supplementation, we will arrange for IV iron with Feraheme x 2 doses, 1 week apart, pending authorization.  Will arrange IV iron at our infusion center on [address].  -Iron deficiency is presumably from GI blood loss.  Apparently FOBT was negative recently.  Patient cannot undergo colonoscopy because of her medical comorbidities.  Hence we will monitor this for now.  Hypercalcemia -She has had intermittent hypercalcemia with calcium of 10.6 previously.  Calcium was 10.9 today.  In the context of anemia, we will check SPEP, IFE, serum free light chains to rule out any underlying monoclonal gammopathy.  Will follow-up on the results and inform patient.  Since the cause of anemia seems to be  obvious from iron deficiency, I am not pursuing extensive workup at this time.  If inadequate response to IV iron is noted, we will pursue workup to rule out other etiologies.  Orders Placed This Encounter  Procedures   CBC with Differential/Platelet    Standing Status:   Future    Number of Occurrences:   1    Expiration Date:    04/18/2024   Comprehensive metabolic panel    Standing Status:   Future    Number of Occurrences:   1    Expiration Date:   04/18/2024   Ferritin    Standing Status:   Future    Number of Occurrences:   1    Expiration Date:   04/18/2024   Folate    Standing Status:   Future    Number of Occurrences:   1    Expiration Date:   04/18/2024   Vitamin B12    Standing Status:   Future    Number of Occurrences:   1    Expiration Date:   04/18/2024   TSH    Standing Status:   Future    Number of Occurrences:   1    Expiration Date:   04/18/2024   Iron and Iron Binding Capacity ([facility] only)    Standing Status:   Future    Number of Occurrences:   1    Expiration Date:   04/18/2024   Lactate dehydrogenase    Standing Status:   Future    Number of Occurrences:   1    Expiration Date:   04/18/2024   Haptoglobin    Standing Status:   Future    Number of Occurrences:   1    Expiration Date:   04/18/2024   Multiple Myeloma Panel (SPEP&IFE w/QIG)    Standing Status:   Future    Number of Occurrences:   1    Expiration Date:   04/18/2024   Kappa/lambda light chains    Standing Status:   Future    Number of Occurrences:   1    Expiration Date:   04/18/2024   I spent a total of 55 minutes during this encounter with the patient including review of chart and various tests results, discussions about plan of care and coordination of care plan.  I reviewed lab results and outside records for this visit and discussed relevant results with the patient. Diagnosis, plan of care and treatment options were also discussed in detail with the patient. Opportunity provided to ask questions and answers provided to her apparent satisfaction. Provided instructions to call our clinic with any problems, questions or concerns prior to return visit. I recommended to continue follow-up with PCP and sub-specialists. She verbalized understanding and agreed with the plan. No barriers to learning was  detected.   Future Appointments  Date Time Provider Department Center  06/04/2023 10:40 AM Corwin Levins, MD [facility] None  06/22/2023 10:40 AM Thomasene Ripple, DO [facility] None  07/19/2023  2:30 PM [facility] None  07/19/2023  3:00 PM Quetzally Callas, MD [facility] None  08/03/2023  1:00 PM Shamleffer, Konrad Dolores, MD [facility] None     [name], MD [facility] A DEPT OF [facility] [address] [place] [address] Dept: [phone] Dept Fax: [phone]  04/19/2023 3:40 PM   This document was completed utilizing speech recognition software. Grammatical errors, random word insertions, pronoun  errors, and incomplete sentences are an occasional consequence of this system due to software limitations, ambient noise, and hardware issues. Any formal questions or concerns about the content, text or information contained within the body of this dictation should be directly addressed to the provider for clarification.

## 2023-04-19 NOTE — Assessment & Plan Note (Addendum)
Hemoglobin levels have been consistently low for several years, with the lowest recorded value being 8.6 in 2015. No known blood loss. Patient has been on oral iron supplements for an extended period.  -Labs today revealed persistent anemia with hemoglobin of 8.8, hematocrit 28.8, MCV 78.7.  White count and platelet count are within normal limits.  CMP showed creatinine of 1.01, calcium 10.9, total protein 8.1, albumin 4.3.  Iron studies showed evidence of iron deficiency with decreased iron saturation of 5%, decreased iron of 23.  Ferritin pending.  LDH normal.  -B12, folate are also pending.  -Given persistent iron deficiency anemia, despite oral iron supplementation, we will arrange for IV iron with Feraheme x 2 doses, 1 week apart, pending authorization.  Will arrange IV iron at our infusion center on [address].  -Iron deficiency is presumably from GI blood loss.  Apparently FOBT was negative recently.  Patient cannot undergo colonoscopy because of her medical comorbidities.  Hence we will monitor this for now.

## 2023-04-20 ENCOUNTER — Telehealth: Payer: Self-pay

## 2023-04-20 LAB — HAPTOGLOBIN: Haptoglobin: 260 mg/dL (ref 41–333)

## 2023-04-20 NOTE — Telephone Encounter (Signed)
Dr. Arlana Pouch, patient will be scheduled as soon as possible.  Auth Submission: NO AUTH NEEDED Site of care: Site of care: [facility] Payer: [employer] Medicare Medication & CPT/J Code(s) submitted: Feraheme (ferumoxytol) [id number] Route of submission (phone, fax, portal): portal Phone # Fax # Auth type: Buy/Bill PB Units/visits requested: 510mg  x 2 doses Reference number: [id number] Approval from: 04/20/23 to 05/01/23   Confirmed on the [facility] portal that prior authorization is not needed.

## 2023-04-23 ENCOUNTER — Telehealth: Payer: Self-pay | Admitting: Pulmonary Disease

## 2023-04-23 LAB — KAPPA/LAMBDA LIGHT CHAINS
Kappa free light chain: 42.4 mg/L — ABNORMAL HIGH (ref 3.3–19.4)
Kappa, lambda light chain ratio: 1.93 — ABNORMAL HIGH (ref 0.26–1.65)
Lambda free light chains: 22 mg/L (ref 5.7–26.3)

## 2023-04-23 NOTE — Telephone Encounter (Signed)
Since her symptoms have only been present for 1-2 days recommend she try OTC medication. I would continue mucinex twice daily and start delsym cough OTC twice daily for cough. She can also use OTC nasal spray such as Flonase to help sinus symptoms. Continue Trelegy, use albuterol hfa 2 puff every 4-6 hours for as breakthrough shortness of breath or wheezing symptoms. Continue 4L   I would hold off on abx and steriods. If symptoms persistent >5 days notify office and we can consider at that time

## 2023-04-23 NOTE — Telephone Encounter (Signed)
Pt daughter Lady Gary (verified DPR) is calling in bc her mother is having alot of coughing, mucus and congestion. It has started yesterday  Pharmacy: [id number] - [place], [place] - [address] AT [address]

## 2023-04-23 NOTE — Telephone Encounter (Signed)
Patient's daughter, Katrina(DPR) is aware of below message/recommendations and voiced her understanding.  Nothing further needed.

## 2023-04-23 NOTE — Telephone Encounter (Signed)
Primary Pulmonologist: Mannam Last office visit and with whom: 02/10/2023 Mannam What do we see them for (pulmonary problems): COPD with chronic bronchitis and emphysema  Last OV assessment/plan:   Instructions   Return in about 4 months (around 06/25/2023) for With Dr. Chilton Greathouse, or APP. VISIT SUMMARY:   [name], during your visit today, we discussed your worsening shortness of breath and reviewed your recent CT scan results. Your oxygen levels drop significantly with minimal activity, but your CT scan shows no signs of pneumonia and stable scarring in your lungs. We also discussed your pulmonary hypertension and the need for further evaluation by your cardiologist.   YOUR PLAN:   -CHRONIC OBSTRUCTIVE PULMONARY DISEASE (COPD): COPD is a chronic lung condition that makes it hard to breathe. Despite being on 4 liters of supplemental oxygen, your oxygen levels drop with minimal exertion. We will continue your current treatment with the Trelegy inhaler and supplemental oxygen at 4 liters.   -PULMONARY HYPERTENSION: Pulmonary hypertension is high blood pressure in the lungs' arteries, often caused by COPD. This condition may be contributing to your shortness of breath. We will contact your cardiologist, Dr. Tawanna Cooler, to order a repeat echocardiogram to reassess your condition.   INSTRUCTIONS:   Please follow up with your cardiologist, Dr. Servando Salina, for a repeat echocardiogram to reassess your pulmonary hypertension and cardiac function. We will see you again in 3-4 months for a follow-up visit.       Was appointment offered to patient (explain)?  None available   Reason for call: Cough, mucous and congestion that started 12/22. Mucous is light yellow and watery.  Denies any fever, chills or body aches.  No nasal congestion.  Yellow mucous from nose.  Has some pain across forehead, it is worse with cough.  She is taking mucinex the plain.  No longer on prednisone.  She is on Trelegy inhaler.   .  Has not tried anything over the counter for cough.  She is sleeping well at night.  The cough bothers her during the day.  No wheezing.  She is on 4L of oxygen.  She is sob with exertion, which is her baseline.    Beth, please advise.  Thank you.  (examples of things to ask: : When did symptoms start? Fever? Cough? Productive? Color to sputum? More sputum than usual? Wheezing? Have you needed increased oxygen? Are you taking your respiratory medications? What over the counter measures have you tried?)  Allergies  Allergen Reactions   Chlorthalidone Shortness Of Breath    Immunization History  Administered Date(s) Administered   Fluad Quad(high Dose 65+) 01/29/2021   Fluad Trivalent(High Dose 65+) 12/25/2022   Influenza Split 04/07/2011, 02/28/2012   Influenza Whole 04/15/2004, 04/18/2007, 01/27/2010   Influenza, High Dose Seasonal PF 02/04/2018, 12/10/2018   Influenza,inj,Quad PF,6+ Mos 03/16/2020   Influenza,inj,quad, With Preservative 01/29/2017   PFIZER(Purple Top)SARS-COV-2 Vaccination 09/14/2019, 10/12/2019   Pneumococcal Conjugate-13 12/03/2013   Pneumococcal Polysaccharide-23 05/02/2003, 09/29/2008, 12/10/2018   Tdap 04/07/2011

## 2023-04-25 ENCOUNTER — Emergency Department (HOSPITAL_COMMUNITY): Payer: Medicare Other

## 2023-04-25 ENCOUNTER — Emergency Department (HOSPITAL_COMMUNITY)
Admission: EM | Admit: 2023-04-25 | Discharge: 2023-04-26 | Disposition: A | Discharge status: Home / Self Care | Payer: Medicare Other | Attending: Emergency Medicine | Admitting: Emergency Medicine

## 2023-04-25 DIAGNOSIS — I509 Heart failure, unspecified: Secondary | ICD-10-CM | POA: Insufficient documentation

## 2023-04-25 DIAGNOSIS — Z79899 Other long term (current) drug therapy: Secondary | ICD-10-CM | POA: Diagnosis not present

## 2023-04-25 DIAGNOSIS — J449 Chronic obstructive pulmonary disease, unspecified: Secondary | ICD-10-CM | POA: Insufficient documentation

## 2023-04-25 DIAGNOSIS — I110 Hypertensive heart disease with heart failure: Secondary | ICD-10-CM | POA: Diagnosis not present

## 2023-04-25 DIAGNOSIS — Z1152 Encounter for screening for COVID-19: Secondary | ICD-10-CM | POA: Diagnosis not present

## 2023-04-25 DIAGNOSIS — I517 Cardiomegaly: Secondary | ICD-10-CM | POA: Diagnosis not present

## 2023-04-25 DIAGNOSIS — R059 Cough, unspecified: Secondary | ICD-10-CM | POA: Diagnosis not present

## 2023-04-25 DIAGNOSIS — Z7982 Long term (current) use of aspirin: Secondary | ICD-10-CM | POA: Insufficient documentation

## 2023-04-25 DIAGNOSIS — J069 Acute upper respiratory infection, unspecified: Secondary | ICD-10-CM | POA: Diagnosis not present

## 2023-04-25 DIAGNOSIS — R0989 Other specified symptoms and signs involving the circulatory and respiratory systems: Secondary | ICD-10-CM | POA: Diagnosis not present

## 2023-04-25 DIAGNOSIS — R0981 Nasal congestion: Secondary | ICD-10-CM | POA: Diagnosis present

## 2023-04-25 DIAGNOSIS — R519 Headache, unspecified: Secondary | ICD-10-CM | POA: Diagnosis not present

## 2023-04-25 LAB — RESP PANEL BY RT-PCR (RSV, FLU A&B, COVID)  RVPGX2
Influenza A by PCR: NEGATIVE
Influenza B by PCR: NEGATIVE
Resp Syncytial Virus by PCR: NEGATIVE
SARS Coronavirus 2 by RT PCR: NEGATIVE

## 2023-04-25 MED ORDER — DOXYCYCLINE HYCLATE 100 MG PO CAPS: 100.0000 mg | ORAL_CAPSULE | Freq: Two times a day (BID) | ORAL | 0 refills | Status: AC

## 2023-04-25 MED ORDER — ACETAMINOPHEN 325 MG PO TABS
650.0000 mg | ORAL_TABLET | Freq: Once | ORAL | Status: AC
  Administered 2023-04-25: 650 mg via ORAL
  Filled 2023-04-25: qty 2

## 2023-04-25 NOTE — Discharge Instructions (Addendum)
Your chest-xray was reassuring and you were negative for COVID and influenza today. We have provided a prescription for doxycycline as a precaution given your ongoing symptoms with hx of COPD. Take as prescribed for symptoms; this antibiotic should cover for a bacterial sinus infection as well as pneumonia.  Continue over the counter remedies as recommended by your Pulmonologist. Follow up with your primary care doctor. Return for new or concerning symptoms.

## 2023-04-25 NOTE — ED Provider Notes (Signed)
[place] EMERGENCY DEPARTMENT AT [facility] Provider Note   CSN: [id number] Arrival date & time: 04/25/23  2146     History {Add pertinent medical, surgical, social history, OB history to HPI:1} Chief Complaint  Patient presents with   Nasal Congestion   Cough    Wendy Torres is a 82 y.o. female.  82 y/o female with hx of HLD, HTN, COPD w/CRF on 4L via [place], CHF presents to the ED for evaluation of nasal congestion and cough x4 days. Cough productive of yellow mucous. She has noted associated facial/sinus pressure since onset of her congestion. Symptoms unrelieved with Mucinex, Delsym, Tylenol. Continues to use her Trelegy inhaler. No increased SOB or DOE from baseline since symptom onset. Family denies fevers, sick contacts.  The history is provided by the patient and a relative. No language interpreter was used.  Cough      Home Medications Prior to Admission medications   Medication Sig Start Date End Date Taking? Authorizing Provider  acetaminophen (TYLENOL) 325 MG tablet Take 2 tablets (650 mg total) by mouth every 6 (six) hours as needed for mild pain (or Fever >/= 101). Patient taking differently: Take 650 mg by mouth every 6 (six) hours as needed for mild pain (pain score 1-3). 02/19/14   Perkins, Alexzandrew L, PA-C  albuterol (VENTOLIN HFA) 108 (90 Base) MCG/ACT inhaler INHALE 2 PUFFS INTO THE LUNGS EVERY 4 HOURS AS NEEDED FOR WHEEZING OR SHORTNESS OF BREATH. Patient taking differently: Inhale 2 puffs into the lungs as needed for wheezing or shortness of breath. 11/01/21   Verlee Monte, MD  ALPRAZolam Prudy Feeler) 0.25 MG tablet TAKE 1 TABLET BY MOUTH TWICE A DAY AS NEEDED 03/19/23   Corwin Levins, MD  amLODipine (NORVASC) 10 MG tablet Take 1 tablet (10 mg total) by mouth daily. 11/14/22   Corwin Levins, MD  aspirin EC 81 MG tablet Take 1 tablet (81 mg total) by mouth daily. Swallow whole. 01/22/23   Rolly Salter, MD  cloNIDine (CATAPRES) 0.2 MG tablet Take 1  tablet (0.2 mg total) by mouth 2 (two) times daily. 02/12/23   Corwin Levins, MD  Fexofenadine HCl [facility] ALLERGY PO) Take 1 tablet by mouth as needed (congestion).    [provider]  fluocinolone (SYNALAR) 0.01 % external solution APPLY TOPICALLY 2 TIMES DAILY AS NEEDED. 07/03/22   Verlee Monte, MD  furosemide (LASIX) 40 MG tablet Take 1 tablet (40 mg total) by mouth daily. 02/28/23 05/29/23  Tobb, Kardie, DO  gabapentin (NEURONTIN) 600 MG tablet Take 1 tablet (600 mg total) by mouth 3 (three) times daily. 01/25/23   Corwin Levins, MD  guaiFENesin (MUCINEX) 600 MG 12 hr tablet Take 1 tablet (600 mg total) by mouth 2 (two) times daily. 01/19/23   Rolly Salter, MD  hydrALAZINE (APRESOLINE) 100 MG tablet Take 1 tablet (100 mg total) by mouth 3 (three) times daily. 12/25/22   Corwin Levins, MD  iron polysaccharides (NU-IRON) 150 MG capsule Take 1 capsule (150 mg total) by mouth daily. 11/10/22   Corwin Levins, MD  lovastatin (MEVACOR) 40 MG tablet TAKE 1 TABLET BY MOUTH AT  BEDTIME 03/19/23   Corwin Levins, MD  meclizine (ANTIVERT) 12.5 MG tablet TAKE 1 TABLET BY MOUTH 3 TIMES A DAY AS NEEDED FOR DIZZINESS Patient taking differently: Take 12.5 mg by mouth 3 (three) times daily as needed for dizziness or nausea. 06/22/21   Corwin Levins, MD  nystatin (  MYCOSTATIN) 100000 UNIT/ML suspension Swish and spit 5mL two times daily after using Symbicort. Patient taking differently: Use as directed 5 mLs in the mouth or throat in the morning and at bedtime. Swish and spit 11/09/22   Corwin Levins, MD  pantoprazole (PROTONIX) 40 MG tablet TAKE 1 TABLET BY MOUTH DAILY 03/06/23   Corwin Levins, MD  polyethylene glycol (MIRALAX / GLYCOLAX) 17 g packet Take 17 g by mouth daily as needed for mild constipation or moderate constipation.    [provider]  potassium chloride SA (KLOR-CON M20) 20 MEQ tablet Take 1 tablet (20 mEq total) by mouth daily. 02/28/23 05/29/23  Tobb, Lavona Mound, DO  predniSONE  (DELTASONE) 10 MG tablet Take 20mg  daily for 3days,Take 10mg  daily for 3days, then stop 01/20/23   Rolly Salter, MD  solifenacin (VESICARE) 10 MG tablet Take 1 tablet (10 mg total) by mouth daily. 11/23/22   Corwin Levins, MD  tiZANidine (ZANAFLEX) 4 MG tablet TAKE 1 TABLET BY MOUTH EVERY 6 HOURS AS NEEDED FOR MUSCLE SPASMS. 03/12/23   Corwin Levins, MD  traMADol (ULTRAM) 50 MG tablet Take 1 tablet (50 mg total) by mouth every 6 (six) hours as needed. for pain Patient taking differently: Take 50 mg by mouth in the morning and at bedtime. 12/25/22   Corwin Levins, MD  traZODone (DESYREL) 50 MG tablet TAKE 1/2 TO 1 TABLET BY MOUTH AT BEDTIME AS NEEDED FOR SLEEP Patient taking differently: Take 25-50 mg by mouth at bedtime as needed for sleep. 11/23/22   Corwin Levins, MD  TRELEGY ELLIPTA 200-62.5-25 MCG/ACT AEPB TAKE 1 PUFF BY MOUTH EVERY DAY 02/09/23   Mannam, Colbert Coyer, MD  triamcinolone cream (KENALOG) 0.1 % APPLY TO AFFECTED AREA TWICE A DAY Patient taking differently: Apply 1 Application topically as needed (eczema). 07/10/22   Corwin Levins, MD  valsartan (DIOVAN) 160 MG tablet Take 1 tablet (160 mg total) by mouth daily. 04/16/23   Tobb, Kardie, DO      Allergies    Chlorthalidone and Benazepril    Review of Systems   Review of Systems  Respiratory:  Positive for cough.   Ten systems reviewed and are negative for acute change, except as noted in the HPI.    Physical Exam Updated Vital Signs BP (!) 184/64 (BP Location: Right Arm)   Pulse 84   Temp 98.5 F (36.9 C) (Oral)   Resp 18   SpO2 95%   Physical Exam Vitals and nursing note reviewed.  Constitutional:      General: She is not in acute distress.    Appearance: She is well-developed. She is not diaphoretic.     Comments: Obese, nontoxic appearing AA female.  HENT:     Head: Normocephalic and atraumatic.     Right Ear: External ear normal.     Left Ear: External ear normal.     Ears:     Comments: TMs obscured by cerumen  b/l Eyes:     General: No scleral icterus.    Extraocular Movements: Extraocular movements intact.     Conjunctiva/sclera: Conjunctivae normal.  Cardiovascular:     Rate and Rhythm: Normal rate and regular rhythm.     Pulses: Normal pulses.  Pulmonary:     Effort: Pulmonary effort is normal. No respiratory distress.     Breath sounds: No stridor. No wheezing or rhonchi.     Comments: Prolonged expiratory phase. No wheezing, rales, rhonchi noted. Respirations even and unlabored. Maintaining good  saturations on chronic 4L via [place]. Musculoskeletal:        General: Normal range of motion.     Cervical back: Normal range of motion.  Skin:    General: Skin is warm and dry.     Coloration: Skin is not pale.     Findings: No erythema or rash.  Neurological:     Mental Status: She is alert and oriented to person, place, and time.     Coordination: Coordination normal.     Comments: Moving all extremities spontaneously.  Psychiatric:        Behavior: Behavior normal.     ED Results / Procedures / Treatments   Labs (all labs ordered are listed, but only abnormal results are displayed) Labs Reviewed  RESP PANEL BY RT-PCR (RSV, FLU A&B, COVID)  [id number]    EKG None  Radiology No results found.  Procedures Procedures  {Document cardiac monitor, telemetry assessment procedure when appropriate:1}  Medications Ordered in ED Medications  acetaminophen (TYLENOL) tablet 650 mg (has no administration in time range)    ED Course/ Medical Decision Making/ A&P   {   Click here for ABCD2, HEART and other calculatorsREFRESH Note before signing :1}                              Medical Decision Making Amount and/or Complexity of Data Reviewed Radiology: ordered.   ***  {Document critical care time when appropriate:1} {Document review of labs and clinical decision tools ie heart score, Chads2Vasc2 etc:1}  {Document your independent review of radiology images, and any outside  records:1} {Document your discussion with family members, caretakers, and with consultants:1} {Document social determinants of health affecting pt's care:1} {Document your decision making why or why not admission, treatments were needed:1} Final Clinical Impression(s) / ED Diagnoses Final diagnoses:  None    Rx / DC Orders ED Discharge Orders     None

## 2023-04-25 NOTE — ED Triage Notes (Signed)
Cough, congestion, facial pain since Monday

## 2023-04-26 ENCOUNTER — Ambulatory Visit: Payer: Medicare Other | Admitting: Internal Medicine

## 2023-04-26 ENCOUNTER — Telehealth: Payer: Self-pay

## 2023-04-26 NOTE — Transitions of Care (Post Inpatient/ED Visit) (Signed)
04/26/2023  Name: Wendy Torres MRN: [id number] DOB: 04-27-1941  Today's TOC FU Call Status: Today's TOC FU Call Status:: Successful TOC FU Call Completed TOC FU Call Complete Date: 04/26/23 Patient's Name and Date of Birth confirmed.  Transition Care Management Follow-up Telephone Call Date of Discharge: 04/26/23 Discharge Facility: [facility]) Type of Discharge: Emergency Department Reason for ED Visit: Other: (acute URI) How have you been since you were released from the hospital?: Same Any questions or concerns?: No  Items Reviewed: Did you receive and understand the discharge instructions provided?: Yes Medications obtained,verified, and reconciled?: Yes (Medications Reviewed) Any new allergies since your discharge?: No Dietary orders reviewed?: NA Do you have support at home?: Yes People in Home: child(ren), adult  Medications Reviewed Today: Medications Reviewed Today     Reviewed by Ouita Nish, Jordan Hawks, CMA (Certified Medical Assistant) on 04/26/23 at 1549  Med List Status: <None>   Medication Order Taking? Sig Documenting Provider Last Dose Status Informant  acetaminophen (TYLENOL) 325 MG tablet [id number] No Take 2 tablets (650 mg total) by mouth every 6 (six) hours as needed for mild pain (or Fever >/= 101).  Patient taking differently: Take 650 mg by mouth every 6 (six) hours as needed for mild pain (pain score 1-3).   Lauraine Rinne, PA-C Taking Active Pharmacy Records, Child  albuterol (VENTOLIN HFA) 108 (90 Base) MCG/ACT inhaler [id number] No INHALE 2 PUFFS INTO THE LUNGS EVERY 4 HOURS AS NEEDED FOR WHEEZING OR SHORTNESS OF BREATH.  Patient taking differently: Inhale 2 puffs into the lungs as needed for wheezing or shortness of breath.   Verlee Monte, MD Taking Active Pharmacy Records, Child  ALPRAZolam Prudy Feeler) 0.25 MG tablet [id number] No TAKE 1 TABLET BY MOUTH TWICE A DAY AS NEEDED Corwin Levins, MD Taking Active   amLODipine (NORVASC) 10 MG tablet  [id number] No Take 1 tablet (10 mg total) by mouth daily. Corwin Levins, MD Taking Active Pharmacy Records, Child  aspirin EC 81 MG tablet [id number] No Take 1 tablet (81 mg total) by mouth daily. Swallow whole. Rolly Salter, MD Taking Active   cloNIDine (CATAPRES) 0.2 MG tablet [id number] No Take 1 tablet (0.2 mg total) by mouth 2 (two) times daily. Corwin Levins, MD Taking Active   doxycycline (VIBRAMYCIN) 100 MG capsule [id number]  Take 1 capsule (100 mg total) by mouth 2 (two) times daily for 7 days. Antony Madura, PA-C  Active   Fexofenadine HCl [facility] ALLERGY PO) [id number] No Take 1 tablet by mouth as needed (congestion). [provider] Taking Active Pharmacy Records, Child  fluocinolone (SYNALAR) 0.01 % external solution [id number] No APPLY TOPICALLY 2 TIMES DAILY AS NEEDED. Verlee Monte, MD Taking Active Pharmacy Records, Child  furosemide (LASIX) 40 MG tablet [id number] No Take 1 tablet (40 mg total) by mouth daily. Tobb, Kardie, DO Taking Active   gabapentin (NEURONTIN) 600 MG tablet [id number] No Take 1 tablet (600 mg total) by mouth 3 (three) times daily. Corwin Levins, MD Taking Active   guaiFENesin (MUCINEX) 600 MG 12 hr tablet [id number] No Take 1 tablet (600 mg total) by mouth 2 (two) times daily. Rolly Salter, MD Taking Active   hydrALAZINE (APRESOLINE) 100 MG tablet [id number] No Take 1 tablet (100 mg total) by mouth 3 (three) times daily. Corwin Levins, MD Taking Active Pharmacy Records, Child  iron polysaccharides (NU-IRON) 150 MG capsule [id number] No Take 1 capsule (150 mg total) by mouth daily. Corwin Levins, MD  Taking Active Pharmacy Records, Child  lovastatin (MEVACOR) 40 MG tablet [id number] No TAKE 1 TABLET BY MOUTH AT  BEDTIME Corwin Levins, MD Taking Active   meclizine (ANTIVERT) 12.5 MG tablet [id number] No TAKE 1 TABLET BY MOUTH 3 TIMES A DAY AS NEEDED FOR DIZZINESS  Patient taking differently: Take 12.5 mg by mouth 3 (three) times daily as needed for dizziness or  nausea.   Corwin Levins, MD Taking Active Pharmacy Records, Child           Med Note Epimenio Sarin, Carlota Raspberry Jan 15, 2023  9:08 AM) Daughter is unsure of last dose  nystatin (MYCOSTATIN) 100000 UNIT/ML suspension [id number] No Swish and spit 5mL two times daily after using Symbicort.  Patient taking differently: Use as directed 5 mLs in the mouth or throat in the morning and at bedtime. Swish and spit   Corwin Levins, MD Taking Active Pharmacy Records, Child           Med Note Epimenio Sarin, Carlota Raspberry Jan 15, 2023  9:09 AM) Per daughter the pt is still taking this medication BID.   pantoprazole (PROTONIX) 40 MG tablet [id number] No TAKE 1 TABLET BY MOUTH DAILY Corwin Levins, MD Taking Active   polyethylene glycol (MIRALAX / GLYCOLAX) 17 g packet [id number] No Take 17 g by mouth daily as needed for mild constipation or moderate constipation. [provider] Taking Active Pharmacy Records, Child  potassium chloride SA (KLOR-CON M20) 20 MEQ tablet [id number] No Take 1 tablet (20 mEq total) by mouth daily. Tobb, Kardie, DO Taking Active   predniSONE (DELTASONE) 10 MG tablet [id number] No Take 20mg  daily for 3days,Take 10mg  daily for 3days, then stop Rolly Salter, MD Taking Active   solifenacin (VESICARE) 10 MG tablet [id number] No Take 1 tablet (10 mg total) by mouth daily. Corwin Levins, MD Taking Active Pharmacy Records, Child  tiZANidine (ZANAFLEX) 4 MG tablet [id number] No TAKE 1 TABLET BY MOUTH EVERY 6 HOURS AS NEEDED FOR MUSCLE SPASMS. Corwin Levins, MD Taking Active   traMADol Janean Sark) 50 MG tablet [id number] No Take 1 tablet (50 mg total) by mouth every 6 (six) hours as needed. for pain  Patient taking differently: Take 50 mg by mouth in the morning and at bedtime.   Corwin Levins, MD Taking Active Pharmacy Records, Child  traZODone (DESYREL) 50 MG tablet [id number] No TAKE 1/2 TO 1 TABLET BY MOUTH AT BEDTIME AS NEEDED FOR SLEEP  Patient taking differently: Take 25-50 mg by mouth at bedtime  as needed for sleep.   Corwin Levins, MD Taking Active Pharmacy Records, Child           Med Note Epimenio Sarin, Carlota Raspberry Jan 15, 2023  9:10 AM) Daughter is unsure of last dose  TRELEGY ELLIPTA 200-62.5-25 MCG/ACT AEPB [id number] No TAKE 1 PUFF BY MOUTH EVERY DAY Mannam, Praveen, MD Taking Active   triamcinolone cream (KENALOG) 0.1 % [id number] No APPLY TO AFFECTED AREA TWICE A DAY  Patient taking differently: Apply 1 Application topically as needed (eczema).   Corwin Levins, MD Taking Active Pharmacy Records, Child  valsartan (DIOVAN) 160 MG tablet [id number] No Take 1 tablet (160 mg total) by mouth daily. Thomasene Ripple, DO Taking Active             Home Care and Equipment/Supplies: Were Home Health Services Ordered?: NA Any new equipment or medical supplies ordered?: NA  Functional Questionnaire: Do you  need assistance with bathing/showering or dressing?: Yes Do you need assistance with meal preparation?: Yes Do you need assistance with eating?: No Do you have difficulty maintaining continence: No Do you need assistance with getting out of bed/getting out of a chair/moving?: Yes Do you have difficulty managing or taking your medications?: Yes  Follow up appointments reviewed: PCP Follow-up appointment confirmed?: Yes Date of PCP follow-up appointment?: 04/30/23 Follow-up Provider: Oliver Barre, MD [facility] Follow-up appointment confirmed?: NA Do you need transportation to your follow-up appointment?: No Do you understand care options if your condition(s) worsen?: Yes-patient verbalized understanding    Abby Makailee Nudelman, CMA  [facility] AWV Team Direct Dial: [phone]

## 2023-04-30 ENCOUNTER — Ambulatory Visit (INDEPENDENT_AMBULATORY_CARE_PROVIDER_SITE_OTHER): Payer: Medicare Other

## 2023-04-30 ENCOUNTER — Ambulatory Visit (INDEPENDENT_AMBULATORY_CARE_PROVIDER_SITE_OTHER): Payer: Medicare Other | Admitting: Internal Medicine

## 2023-04-30 ENCOUNTER — Encounter: Payer: Self-pay | Admitting: Internal Medicine

## 2023-04-30 VITALS — BP 140/82 | HR 73 | Temp 98.7°F | Ht 64.0 in

## 2023-04-30 DIAGNOSIS — R058 Other specified cough: Secondary | ICD-10-CM | POA: Insufficient documentation

## 2023-04-30 DIAGNOSIS — I771 Stricture of artery: Secondary | ICD-10-CM | POA: Diagnosis not present

## 2023-04-30 DIAGNOSIS — E559 Vitamin D deficiency, unspecified: Secondary | ICD-10-CM | POA: Diagnosis not present

## 2023-04-30 DIAGNOSIS — J449 Chronic obstructive pulmonary disease, unspecified: Secondary | ICD-10-CM | POA: Diagnosis not present

## 2023-04-30 DIAGNOSIS — J9611 Chronic respiratory failure with hypoxia: Secondary | ICD-10-CM | POA: Diagnosis not present

## 2023-04-30 DIAGNOSIS — R0989 Other specified symptoms and signs involving the circulatory and respiratory systems: Secondary | ICD-10-CM | POA: Diagnosis not present

## 2023-04-30 DIAGNOSIS — R059 Cough, unspecified: Secondary | ICD-10-CM | POA: Diagnosis not present

## 2023-04-30 DIAGNOSIS — I517 Cardiomegaly: Secondary | ICD-10-CM | POA: Diagnosis not present

## 2023-04-30 MED ORDER — CEFTRIAXONE SODIUM 500 MG IJ SOLR
500.0000 mg | Freq: Once | INTRAMUSCULAR | Status: AC
  Administered 2023-04-30: 500 mg via INTRAMUSCULAR

## 2023-04-30 MED ORDER — LEVOFLOXACIN 500 MG PO TABS: 500.0000 mg | ORAL_TABLET | Freq: Every day | ORAL | 0 refills | Status: DC

## 2023-04-30 MED ORDER — HYDROCODONE BIT-HOMATROP MBR 5-1.5 MG/5ML PO SOLN: 5.0000 mL | Freq: Four times a day (QID) | ORAL | 0 refills | Status: AC | PRN

## 2023-04-30 NOTE — Progress Notes (Signed)
Patient ID: Wendy Torres, female   DOB: 10-Mar-1941, 82 y.o.   MRN: [id number]        Chief Complaint: follow up worsening prod cough, chronic hypoxic resp failure, copd, low vit d       HPI:  Wendy Torres is a 82 y.o. female Here with acute onset mild to mod 2-3 days ST, HA, general weakness and malaise, with prod cough greenish sputum, but Pt denies chest pain, increased sob or doe, wheezing, orthopnea, PND, increased LE swelling, palpitations, dizziness or syncope.   Pt denies polydipsia, polyuria, or new focal neuro s/s.          Wt Readings from Last 3 Encounters:  04/19/23 202 lb 8 oz (91.9 kg)  02/28/23 205 lb 6.4 oz (93.2 kg)  02/23/23 204 lb 8 oz (92.8 kg)   BP Readings from Last 3 Encounters:  04/30/23 (!) 140/82  04/26/23 (!) 171/60  04/19/23 (!) 168/63         Past Medical History:  Diagnosis Date   ALLERGIC RHINITIS 12/09/2006   ANEMIA 01/14/2009   Anxiety    Arthritis    ASTHMA 12/09/2006   Cataract    bilateral -removed   CHF (congestive heart failure) (HCC)    Chronic pain syndrome 02/16/2009   COPD 12/09/2006   DEGENERATIVE JOINT DISEASE 12/09/2006   DIZZINESS 03/30/2010   DYSPNEA 01/27/2010   with heavy exertion   FREQUENCY, URINARY 03/30/2010   GERD 12/09/2006   HELICOBACTER PYLORI GASTRITIS, HX OF 12/09/2006   HIATAL HERNIA 03/01/2010   HYPERLIPIDEMIA 04/18/2007   HYPERSOMNIA 08/31/2008   HYPERTENSION 12/09/2006   INSOMNIA-SLEEP DISORDER-UNSPEC 11/05/2008   LOW BACK PAIN 12/09/2006   Morbid obesity (HCC) 12/09/2006   Nonspecific (abnormal) findings on radiological and other examination of body structure 11/05/2008   OSTEOPOROSIS 12/09/2006   OVERACTIVE BLADDER 04/14/2008   PALPITATIONS, RECURRENT 01/27/2010   Stroke (HCC)    pt. stated light one years ago   SYNCOPE 11/05/2008   TRANSIENT ISCHEMIC ATTACK, HX OF 12/09/2006   Past Surgical History:  Procedure Laterality Date   BIOPSY  01/18/2023   Procedure: BIOPSY;  Surgeon: Meryl Dare,  MD;  Location: [facility];  Service: Gastroenterology;;   COLONOSCOPY     ESOPHAGOGASTRODUODENOSCOPY (EGD) WITH PROPOFOL N/A 01/18/2023   Procedure: ESOPHAGOGASTRODUODENOSCOPY (EGD) WITH PROPOFOL;  Surgeon: Meryl Dare, MD;  Location: [facility];  Service: Gastroenterology;  Laterality: N/A;   HOT HEMOSTASIS N/A 01/18/2023   Procedure: HOT HEMOSTASIS (ARGON PLASMA COAGULATION/BICAP);  Surgeon: Meryl Dare, MD;  Location: [facility];  Service: Gastroenterology;  Laterality: N/A;   LUMBAR DISC SURGERY     TONSILLECTOMY     as a child   TOTAL HIP ARTHROPLASTY Right 11/06/2012   Procedure: RIGHT TOTAL HIP ARTHROPLASTY WITH ACETABULAR AUTOGRAFT;  Surgeon: Loanne Drilling, MD;  Location: [facility];  Service: Orthopedics;  Laterality: Right;   TOTAL HIP ARTHROPLASTY Left 02/18/2014   Procedure: LEFT TOTAL HIP ARTHROPLASTY;  Surgeon: Loanne Drilling, MD;  Location: [facility];  Service: Orthopedics;  Laterality: Left;   TUBAL LIGATION      reports that she quit smoking about 50 years ago. Her smoking use included cigarettes. She started smoking about 75 years ago. She has a 12.5 pack-year smoking history. She has never used smokeless tobacco. She reports that she does not drink alcohol and does not use drugs. family history includes Heart disease in her mother; Hypertension in her brother; Lung cancer in her father. Allergies  Allergen Reactions   Chlorthalidone Shortness Of Breath   Benazepril Swelling   Current Outpatient Medications on File Prior to Visit  Medication Sig Dispense Refill   acetaminophen (TYLENOL) 325 MG tablet Take 2 tablets (650 mg total) by mouth every 6 (six) hours as needed for mild pain (or Fever >/= 101). (Patient taking differently: Take 650 mg by mouth every 6 (six) hours as needed for mild pain (pain score 1-3).) 40 tablet 0   albuterol (VENTOLIN HFA) 108 (90 Base) MCG/ACT inhaler INHALE 2 PUFFS INTO THE LUNGS EVERY 4 HOURS AS NEEDED FOR WHEEZING OR SHORTNESS OF  BREATH. (Patient taking differently: Inhale 2 puffs into the lungs as needed for wheezing or shortness of breath.) 8.5 each 1   ALPRAZolam (XANAX) 0.25 MG tablet TAKE 1 TABLET BY MOUTH TWICE A DAY AS NEEDED 60 tablet 2   amLODipine (NORVASC) 10 MG tablet Take 1 tablet (10 mg total) by mouth daily. 90 tablet 3   aspirin EC 81 MG tablet Take 1 tablet (81 mg total) by mouth daily. Swallow whole.     cloNIDine (CATAPRES) 0.2 MG tablet Take 1 tablet (0.2 mg total) by mouth 2 (two) times daily. 180 tablet 3   doxycycline (VIBRAMYCIN) 100 MG capsule Take 1 capsule (100 mg total) by mouth 2 (two) times daily for 7 days. 14 capsule 0   Fexofenadine HCl (MUCINEX ALLERGY PO) Take 1 tablet by mouth as needed (congestion).     fluocinolone (SYNALAR) 0.01 % external solution APPLY TOPICALLY 2 TIMES DAILY AS NEEDED. 60 mL 0   furosemide (LASIX) 40 MG tablet Take 1 tablet (40 mg total) by mouth daily. 90 tablet 3   gabapentin (NEURONTIN) 600 MG tablet Take 1 tablet (600 mg total) by mouth 3 (three) times daily. 270 tablet 1   guaiFENesin (MUCINEX) 600 MG 12 hr tablet Take 1 tablet (600 mg total) by mouth 2 (two) times daily. 30 tablet 0   hydrALAZINE (APRESOLINE) 100 MG tablet Take 1 tablet (100 mg total) by mouth 3 (three) times daily. 270 tablet 3   iron polysaccharides (NU-IRON) 150 MG capsule Take 1 capsule (150 mg total) by mouth daily. 90 capsule 1   lovastatin (MEVACOR) 40 MG tablet TAKE 1 TABLET BY MOUTH AT  BEDTIME 100 tablet 2   meclizine (ANTIVERT) 12.5 MG tablet TAKE 1 TABLET BY MOUTH 3 TIMES A DAY AS NEEDED FOR DIZZINESS (Patient taking differently: Take 12.5 mg by mouth 3 (three) times daily as needed for dizziness or nausea.) 30 tablet 2   nystatin (MYCOSTATIN) 100000 UNIT/ML suspension Swish and spit 5mL two times daily after using Symbicort. (Patient taking differently: Use as directed 5 mLs in the mouth or throat in the morning and at bedtime. Swish and spit) 473 mL 5   pantoprazole (PROTONIX) 40  MG tablet TAKE 1 TABLET BY MOUTH DAILY 90 tablet 3   polyethylene glycol (MIRALAX / GLYCOLAX) 17 g packet Take 17 g by mouth daily as needed for mild constipation or moderate constipation.     potassium chloride SA (KLOR-CON M20) 20 MEQ tablet Take 1 tablet (20 mEq total) by mouth daily. 90 tablet 3   predniSONE (DELTASONE) 10 MG tablet Take 20mg  daily for 3days,Take 10mg  daily for 3days, then stop 10 tablet 0   solifenacin (VESICARE) 10 MG tablet Take 1 tablet (10 mg total) by mouth daily. 90 tablet 1   tiZANidine (ZANAFLEX) 4 MG tablet TAKE 1 TABLET BY MOUTH EVERY 6 HOURS AS NEEDED FOR MUSCLE SPASMS.  50 tablet 1   traMADol (ULTRAM) 50 MG tablet Take 1 tablet (50 mg total) by mouth every 6 (six) hours as needed. for pain (Patient taking differently: Take 50 mg by mouth in the morning and at bedtime.) 120 tablet 2   traZODone (DESYREL) 50 MG tablet TAKE 1/2 TO 1 TABLET BY MOUTH AT BEDTIME AS NEEDED FOR SLEEP (Patient taking differently: Take 25-50 mg by mouth at bedtime as needed for sleep.) 90 tablet 1   TRELEGY ELLIPTA 200-62.5-25 MCG/ACT AEPB TAKE 1 PUFF BY MOUTH EVERY DAY 60 each 2   triamcinolone cream (KENALOG) 0.1 % APPLY TO AFFECTED AREA TWICE A DAY (Patient taking differently: Apply 1 Application topically as needed (eczema).) 30 g 1   valsartan (DIOVAN) 160 MG tablet Take 1 tablet (160 mg total) by mouth daily. 90 tablet 3   No current facility-administered medications on file prior to visit.        ROS:  All others reviewed and negative.  Objective        PE:  BP (!) 140/82 (BP Location: Left Arm, Patient Position: Sitting, Cuff Size: Normal)   Pulse 73   Temp 98.7 F (37.1 C) (Oral)   Ht 5\' 4"  (1.626 m)   SpO2 98%   BMI 34.76 kg/m                 Constitutional: Pt appears mild ill               HENT: Head: NCAT.                Right Ear: External ear normal.                 Left Ear: External ear normal. Bilat tm's with mild erythema.  Max sinus areas non tender.  Pharynx  with mild erythema, no exudate               Eyes: . Pupils are equal, round, and reactive to light. Conjunctivae and EOM are normal               Nose: without d/c or deformity               Neck: Neck supple. Gross normal ROM               Cardiovascular: Normal rate and regular rhythm.                 Pulmonary/Chest: Effort normal and breath sounds without rales or wheezing.                Abd:  Soft, NT, ND, + BS, no organomegaly               Neurological: Pt is alert. At baseline orientation, motor grossly intact               Skin: Skin is warm. No rashes, no other new lesions, LE edema - none               Psychiatric: Pt behavior is normal without agitation   Micro: none  Cardiac tracings I have personally interpreted today:  none  Pertinent Radiological findings (summarize): none   Lab Results  Component Value Date   WBC 7.1 04/19/2023   HGB 8.8 (L) 04/19/2023   HCT 28.8 (L) 04/19/2023   PLT 371 04/19/2023   GLUCOSE 94 04/19/2023   CHOL 167 11/09/2022   TRIG 73.0 11/09/2022   HDL 63.10 11/09/2022  LDLCALC 89 11/09/2022   ALT 8 04/19/2023   AST 15 04/19/2023   NA 136 04/19/2023   K 3.9 04/19/2023   CL 97 (L) 04/19/2023   CREATININE 1.01 (H) 04/19/2023   BUN 15 04/19/2023   CO2 33 (H) 04/19/2023   TSH 1.588 04/19/2023   INR 1.02 02/10/2014   HGBA1C 5.7 11/09/2022   MICROALBUR 0.7 11/09/2022   Assessment/Plan:  Wendy Torres is a 82 y.o. Black or African American [2] female with  has a past medical history of ALLERGIC RHINITIS (12/09/2006), ANEMIA (01/14/2009), Anxiety, Arthritis, ASTHMA (12/09/2006), Cataract, CHF (congestive heart failure) (HCC), Chronic pain syndrome (02/16/2009), COPD (12/09/2006), DEGENERATIVE JOINT DISEASE (12/09/2006), DIZZINESS (03/30/2010), DYSPNEA (01/27/2010), FREQUENCY, URINARY (03/30/2010), GERD (12/09/2006), HELICOBACTER PYLORI GASTRITIS, HX OF (12/09/2006), HIATAL HERNIA (03/01/2010), HYPERLIPIDEMIA (04/18/2007), HYPERSOMNIA  (08/31/2008), HYPERTENSION (12/09/2006), INSOMNIA-SLEEP DISORDER-UNSPEC (11/05/2008), LOW BACK PAIN (12/09/2006), Morbid obesity (HCC) (12/09/2006), Nonspecific (abnormal) findings on radiological and other examination of body structure (11/05/2008), OSTEOPOROSIS (12/09/2006), OVERACTIVE BLADDER (04/14/2008), PALPITATIONS, RECURRENT (01/27/2010), Stroke (HCC), SYNCOPE (11/05/2008), and TRANSIENT ISCHEMIC ATTACK, HX OF (12/09/2006).  Chronic respiratory failure with hypoxia (HCC) Voerall stable, cont home o2  Chronic obstructive pulmonary disease (HCC) Overall stable, continnue inhaler asd  Vitamin D deficiency Last vitamin D Lab Results  Component Value Date   VD25OH 21.95 (L) 02/01/2023   Low, to start oral replacement   Productive cough Mild to mod, worsening, for change doxy to levaquin 500 every day, cough med prn, for cxr,  to f/u any worsening symptoms or concerns  Followup: Return in about 5 weeks (around 06/04/2023).  Oliver Barre, MD 04/30/2023 8:30 PM [facility]

## 2023-04-30 NOTE — Assessment & Plan Note (Addendum)
Mild to mod, worsening, for rocephin Im 1 gm, change doxy to levaquin 500 every day, cough med prn, for cxr,  to f/u any worsening symptoms or concerns

## 2023-04-30 NOTE — Assessment & Plan Note (Signed)
Last vitamin D Lab Results  Component Value Date   VD25OH 21.95 (L) 02/01/2023   Low, to start oral replacement

## 2023-04-30 NOTE — Assessment & Plan Note (Signed)
Voerall stable, cont home o2

## 2023-04-30 NOTE — Patient Instructions (Addendum)
You had the antibiotic shot today (rocephin)  Ok to stop the doxycycline  Please take all new medication as prescribed - the levaquin and cough medicine as needed  Please continue all other medications as before, and refills have been done if requested.  Please have the pharmacy call with any other refills you may need.  Please continue your efforts at being more active, low cholesterol diet, and weight control  Please keep your appointments with your specialists as you may have planned  Please go to the XRAY Department in the first floor for the x-ray testing  You will be contacted by phone if any changes need to be made immediately.  Otherwise, you will receive a letter about your results with an explanation, but please check with MyChart first.  Please make an Appointment to return in Feb 3, or sooner if needed

## 2023-04-30 NOTE — Assessment & Plan Note (Signed)
Overall stable, continnue inhaler asd

## 2023-05-01 LAB — MULTIPLE MYELOMA PANEL, SERUM
Albumin SerPl Elph-Mcnc: 3.7 g/dL (ref 2.9–4.4)
Albumin/Glob SerPl: 0.9 (ref 0.7–1.7)
Alpha 1: 0.3 g/dL (ref 0.0–0.4)
Alpha2 Glob SerPl Elph-Mcnc: 1 g/dL (ref 0.4–1.0)
B-Globulin SerPl Elph-Mcnc: 1.5 g/dL — ABNORMAL HIGH (ref 0.7–1.3)
Gamma Glob SerPl Elph-Mcnc: 1.4 g/dL (ref 0.4–1.8)
Globulin, Total: 4.3 g/dL — ABNORMAL HIGH (ref 2.2–3.9)
IgA: 455 mg/dL — ABNORMAL HIGH (ref 64–422)
IgG (Immunoglobin G), Serum: 1714 mg/dL — ABNORMAL HIGH (ref 586–1602)
IgM (Immunoglobulin M), Srm: 28 mg/dL (ref 26–217)
Total Protein ELP: 8 g/dL (ref 6.0–8.5)

## 2023-05-01 MED ORDER — ALBUTEROL SULFATE HFA 108 (90 BASE) MCG/ACT IN AERS: 2.0000 | INHALATION_SPRAY | RESPIRATORY_TRACT | 1 refills | Status: DC | PRN

## 2023-05-08 ENCOUNTER — Encounter: Payer: Self-pay | Admitting: Pulmonary Disease

## 2023-05-08 NOTE — Telephone Encounter (Signed)
 Wendy Torres daughter is checking on message for Trelegy inhaler. Wendy Torres phone number is [phone].

## 2023-05-09 NOTE — Telephone Encounter (Signed)
 We got Trellegy. NFN from Triage Team.

## 2023-05-09 NOTE — Telephone Encounter (Signed)
 Ok to give samples for trelegy. Please message to pharmacy team to look for cheaper alternatives to trelegy

## 2023-05-09 NOTE — Telephone Encounter (Signed)
 PT calling again for Trelegy samples. States it is needed ASAP because PT is out.   I will fwd this message to PA's for alt medications but Triage please fill.  Please call daughter to advise when ready.

## 2023-05-10 ENCOUNTER — Encounter: Payer: Self-pay | Admitting: Oncology

## 2023-05-10 ENCOUNTER — Other Ambulatory Visit: Payer: Self-pay

## 2023-05-10 ENCOUNTER — Ambulatory Visit: Payer: Medicare Other

## 2023-05-10 ENCOUNTER — Other Ambulatory Visit (HOSPITAL_COMMUNITY): Payer: Self-pay

## 2023-05-10 NOTE — Telephone Encounter (Signed)
 Contacted patient pharmacy to confirm price and it is $300 with her insurance. I'm unable to see, but it's possible she has a beginning of the year deductible to meet. Alternatively, the Breztri  inhaler is showing a co-pay of $47.00 if provider finds it appropriate for change. If not, please let me know which inhalers you'd like us  to test claim. Thanks !

## 2023-05-10 NOTE — Patient Outreach (Signed)
 [facility]ging [facility]fully Program  05/10/2023  AILANIE RUTTAN 1941/03/28 [id number]   [facility] Evaluation Interviewer attempted to call patient on today regarding Aging Gracefully referral. No answer from patient after multiple rings. CMA left confidential voicemail for patient to return call.  Will attempt to call back within 1 week.   Shereen Gin Care Management Assistant [phone]

## 2023-05-11 ENCOUNTER — Ambulatory Visit: Payer: Medicare Other

## 2023-05-11 MED ORDER — BREZTRI AEROSPHERE 160-9-4.8 MCG/ACT IN AERO: 2.0000 | INHALATION_SPRAY | Freq: Two times a day (BID) | RESPIRATORY_TRACT | 3 refills | Status: DC

## 2023-05-11 NOTE — Telephone Encounter (Signed)
 Called and discussed with patient's daughter.  Wendy Torres is more affordable $47.  I have called in a prescription to her pharmacy.  Nothing further needed.

## 2023-05-11 NOTE — Telephone Encounter (Signed)
 Dr.Mannam please advise on inhaler alternatives

## 2023-05-17 ENCOUNTER — Ambulatory Visit: Payer: Medicare Other

## 2023-05-17 VITALS — BP 163/69 | HR 78 | Temp 98.0°F | Resp 18 | Ht 64.0 in | Wt 204.8 lb

## 2023-05-17 DIAGNOSIS — D509 Iron deficiency anemia, unspecified: Secondary | ICD-10-CM | POA: Diagnosis not present

## 2023-05-17 MED ORDER — ACETAMINOPHEN 325 MG PO TABS
650.0000 mg | ORAL_TABLET | Freq: Once | ORAL | Status: AC
  Administered 2023-05-17: 650 mg via ORAL
  Filled 2023-05-17: qty 2

## 2023-05-17 MED ORDER — SODIUM CHLORIDE 0.9 % IV SOLN
510.0000 mg | Freq: Once | INTRAVENOUS | Status: AC
  Administered 2023-05-17: 510 mg via INTRAVENOUS
  Filled 2023-05-17: qty 17

## 2023-05-17 MED ORDER — DIPHENHYDRAMINE HCL 25 MG PO CAPS
25.0000 mg | ORAL_CAPSULE | Freq: Once | ORAL | Status: AC
Start: 2023-05-17 — End: 2023-05-17
  Administered 2023-05-17: 25 mg via ORAL
  Filled 2023-05-17: qty 1

## 2023-05-17 NOTE — Progress Notes (Signed)
Diagnosis: Iron Deficiency Anemia  Provider:  Chilton Greathouse MD  Procedure: IV Infusion  IV Type: Peripheral, IV Location: L Antecubital  Feraheme (Ferumoxytol), Dose: 510 mg  Infusion Start Time: 1235  Infusion Stop Time: 1255  Post Infusion IV Care: Observation period completed and Peripheral IV Discontinued  Discharge: Condition: Good, Destination: Home . AVS Provided  Performed by:  Adriana Mccallum, RN

## 2023-05-17 NOTE — Patient Instructions (Signed)
 Ferumoxytol Injection What is this medication? FERUMOXYTOL (FER ue MOX i tol) treats low levels of iron in your body (iron deficiency anemia). Iron is a mineral that plays an important role in making red blood cells, which carry oxygen from your lungs to the rest of your body. This medicine may be used for other purposes; ask your health care provider or pharmacist if you have questions. COMMON BRAND NAME(S): Feraheme What should I tell my care team before I take this medication? They need to know if you have any of these conditions: Anemia not caused by low iron levels High levels of iron in the blood Magnetic resonance imaging (MRI) test scheduled An unusual or allergic reaction to iron, other medications, foods, dyes, or preservatives Pregnant or trying to get pregnant Breastfeeding How should I use this medication? This medication is injected into a vein. It is given by your care team in a hospital or clinic setting. Talk to your care team the use of this medication in children. Special care may be needed. Overdosage: If you think you have taken too much of this medicine contact a poison control center or emergency room at once. NOTE: This medicine is only for you. Do not share this medicine with others. What if I miss a dose? It is important not to miss your dose. Call your care team if you are unable to keep an appointment. What may interact with this medication? Other iron products This list may not describe all possible interactions. Give your health care provider a list of all the medicines, herbs, non-prescription drugs, or dietary supplements you use. Also tell them if you smoke, drink alcohol, or use illegal drugs. Some items may interact with your medicine. What should I watch for while using this medication? Visit your care team regularly. Tell your care team if your symptoms do not start to get better or if they get worse. You may need blood work done while you are taking this  medication. You may need to follow a special diet. Talk to your care team. Foods that contain iron include: whole grains/cereals, dried fruits, beans, or peas, leafy green vegetables, and organ meats (liver, kidney). What side effects may I notice from receiving this medication? Side effects that you should report to your care team as soon as possible: Allergic reactions--skin rash, itching, hives, swelling of the face, lips, tongue, or throat Low blood pressure--dizziness, feeling faint or lightheaded, blurry vision Shortness of breath Side effects that usually do not require medical attention (report to your care team if they continue or are bothersome): Flushing Headache Joint pain Muscle pain Nausea Pain, redness, or irritation at injection site This list may not describe all possible side effects. Call your doctor for medical advice about side effects. You may report side effects to FDA at 1-800-FDA-1088. Where should I keep my medication? This medication is given in a hospital or clinic. It will not be stored at home. NOTE: This sheet is a summary. It may not cover all possible information. If you have questions about this medicine, talk to your doctor, pharmacist, or health care provider.  2024 Elsevier/Gold Standard (2022-09-22 00:00:00)

## 2023-05-18 ENCOUNTER — Ambulatory Visit: Payer: Medicare Other

## 2023-05-19 ENCOUNTER — Other Ambulatory Visit: Payer: Self-pay | Admitting: Internal Medicine

## 2023-05-21 ENCOUNTER — Other Ambulatory Visit: Payer: Self-pay

## 2023-05-24 ENCOUNTER — Ambulatory Visit: Payer: Medicare Other

## 2023-05-24 VITALS — BP 160/67 | HR 69 | Temp 97.7°F | Resp 22 | Ht 64.0 in | Wt 201.8 lb

## 2023-05-24 DIAGNOSIS — D509 Iron deficiency anemia, unspecified: Secondary | ICD-10-CM

## 2023-05-24 MED ORDER — ACETAMINOPHEN 325 MG PO TABS
650.0000 mg | ORAL_TABLET | Freq: Once | ORAL | Status: AC
  Administered 2023-05-24: 650 mg via ORAL
  Filled 2023-05-24: qty 2

## 2023-05-24 MED ORDER — DIPHENHYDRAMINE HCL 25 MG PO CAPS
25.0000 mg | ORAL_CAPSULE | Freq: Once | ORAL | Status: AC
  Administered 2023-05-24: 25 mg via ORAL
  Filled 2023-05-24: qty 1

## 2023-05-24 MED ORDER — SODIUM CHLORIDE 0.9 % IV SOLN
510.0000 mg | Freq: Once | INTRAVENOUS | Status: AC
  Administered 2023-05-24: 510 mg via INTRAVENOUS
  Filled 2023-05-24: qty 17

## 2023-05-24 NOTE — Progress Notes (Signed)
Diagnosis: Iron Deficiency Anemia  Provider:  Chilton Greathouse MD  Procedure: IV Infusion  IV Type: Peripheral, IV Location: L Antecubital  Feraheme (Ferumoxytol), Dose: 510 mg  Infusion Start Time: 1202  Infusion Stop Time: 1218  Post Infusion IV Care: Observation period completed and Peripheral IV Discontinued  Discharge: Condition: Good, Destination: Home . AVS Declined  Performed by:  Adriana Mccallum, RN

## 2023-05-25 ENCOUNTER — Ambulatory Visit (INDEPENDENT_AMBULATORY_CARE_PROVIDER_SITE_OTHER): Payer: Medicare Other

## 2023-05-25 DIAGNOSIS — Z0000 Encounter for general adult medical examination without abnormal findings: Secondary | ICD-10-CM | POA: Diagnosis not present

## 2023-05-25 NOTE — Patient Instructions (Signed)
Wendy Torres , Thank you for taking time to come for your Medicare Wellness Visit. I appreciate your ongoing commitment to your health goals. Please review the following plan we discussed and let me know if I can assist you in the future.   Referrals/Orders/Follow-Ups/Clinician Recommendations: none  This is a list of the screening recommended for you and due dates:  Health Maintenance  Topic Date Due   Zoster (Shingles) Vaccine (1 of 2) Never done   DTaP/Tdap/Td vaccine (2 - Td or Tdap) 04/06/2021   Medicare Annual Wellness Visit  05/24/2024   Pneumonia Vaccine  Completed   Flu Shot  Completed   DEXA scan (bone density measurement)  Completed   HPV Vaccine  Aged Out   COVID-19 Vaccine  Discontinued    Advanced directives: (ACP Link)Information on Advanced Care Planning can be found at [facility] [place] Advance Health Care Directives Advance Health Care Directives ([email or web address]   Next Medicare Annual Wellness Visit scheduled for next year: Yes  insert Preventive Care attachment Insert FALL PREVENTION attachment if needed

## 2023-05-25 NOTE — Progress Notes (Signed)
Subjective:   Wendy Torres is a 83 y.o. female who presents for Medicare Annual (Subsequent) preventive examination.  Visit Complete: Virtual I connected with  Wendy Torres on 05/25/23 by a video and audio enabled telemedicine application and verified that I am speaking with the correct person using two identifiers. Daughter also on call.  Patient Location: Home  Provider Location: Home Office  I discussed the limitations of evaluation and management by telemedicine. The patient expressed understanding and agreed to proceed.  Vital Signs: Because this visit was a virtual/telehealth visit, some criteria may be missing or patient reported. Any vitals not documented were not able to be obtained and vitals that have been documented are patient reported.    Cardiac Risk Factors include: advanced age (>83men, >24 women);dyslipidemia;hypertension     Objective:    Today's Vitals   05/25/23 1424  PainSc: 3    There is no height or weight on file to calculate BMI.     05/25/2023    2:36 PM 05/24/2023   11:00 AM 04/19/2023    1:23 PM 01/29/2023    5:36 PM 01/14/2023   11:38 PM 12/19/2022    7:43 PM 06/23/2022    4:13 PM  Advanced Directives  Does Patient Have a Medical Advance Directive? No No No Yes Yes Yes No  Type of Aeronautical engineer of [place];Living will Healthcare Power of [place];Living will Living will;Healthcare Power of Attorney   Does patient want to make changes to medical advance directive?     Yes (ED - Information included in AVS) Yes (ED - Information included in AVS)   Copy of Healthcare Power of Attorney in Chart?     No - copy requested No - copy requested   Would patient like information on creating a medical advance directive? No - Patient declined No - Patient declined No - Patient declined    No - Patient declined    Current Medications (verified) Outpatient Encounter Medications as of 05/25/2023  Medication Sig   acetaminophen  (TYLENOL) 325 MG tablet Take 2 tablets (650 mg total) by mouth every 6 (six) hours as needed for mild pain (or Fever >/= 101). (Patient taking differently: Take 650 mg by mouth every 6 (six) hours as needed for mild pain (pain score 1-3).)   albuterol (VENTOLIN HFA) 108 (90 Base) MCG/ACT inhaler Inhale 2 puffs into the lungs every 4 (four) hours as needed for wheezing or shortness of breath.   ALPRAZolam (XANAX) 0.25 MG tablet TAKE 1 TABLET BY MOUTH TWICE A DAY AS NEEDED   amLODipine (NORVASC) 10 MG tablet Take 1 tablet (10 mg total) by mouth daily.   aspirin EC 81 MG tablet Take 1 tablet (81 mg total) by mouth daily. Swallow whole.   Budeson-Glycopyrrol-Formoterol (BREZTRI AEROSPHERE) 160-9-4.8 MCG/ACT AERO Inhale 2 puffs into the lungs in the morning and at bedtime.   cloNIDine (CATAPRES) 0.2 MG tablet Take 1 tablet (0.2 mg total) by mouth 2 (two) times daily.   Fexofenadine HCl (MUCINEX ALLERGY PO) Take 1 tablet by mouth as needed (congestion).   furosemide (LASIX) 40 MG tablet Take 1 tablet (40 mg total) by mouth daily.   gabapentin (NEURONTIN) 600 MG tablet Take 1 tablet (600 mg total) by mouth 3 (three) times daily.   guaiFENesin (MUCINEX) 600 MG 12 hr tablet Take 1 tablet (600 mg total) by mouth 2 (two) times daily.   hydrALAZINE (APRESOLINE) 100 MG tablet Take 1 tablet (100 mg total)  by mouth 3 (three) times daily.   iron polysaccharides (NIFEREX) 150 MG capsule TAKE 1 CAPSULE BY MOUTH EVERY DAY   lovastatin (MEVACOR) 40 MG tablet TAKE 1 TABLET BY MOUTH AT  BEDTIME   meclizine (ANTIVERT) 12.5 MG tablet TAKE 1 TABLET BY MOUTH 3 TIMES A DAY AS NEEDED FOR DIZZINESS (Patient taking differently: Take 12.5 mg by mouth 3 (three) times daily as needed for dizziness or nausea.)   nystatin (MYCOSTATIN) 100000 UNIT/ML suspension Swish and spit 5mL two times daily after using Symbicort. (Patient taking differently: Use as directed 5 mLs in the mouth or throat in the morning and at bedtime. Swish and spit)    pantoprazole (PROTONIX) 40 MG tablet TAKE 1 TABLET BY MOUTH DAILY   polyethylene glycol (MIRALAX / GLYCOLAX) 17 g packet Take 17 g by mouth daily as needed for mild constipation or moderate constipation.   potassium chloride SA (KLOR-CON M20) 20 MEQ tablet Take 1 tablet (20 mEq total) by mouth daily.   solifenacin (VESICARE) 10 MG tablet Take 1 tablet (10 mg total) by mouth daily.   tiZANidine (ZANAFLEX) 4 MG tablet TAKE 1 TABLET BY MOUTH EVERY 6 HOURS AS NEEDED FOR MUSCLE SPASMS.   traMADol (ULTRAM) 50 MG tablet Take 1 tablet (50 mg total) by mouth every 6 (six) hours as needed. for pain (Patient taking differently: Take 50 mg by mouth in the morning and at bedtime.)   traZODone (DESYREL) 50 MG tablet TAKE 1/2 TO 1 TABLET BY MOUTH AT BEDTIME AS NEEDED FOR SLEEP (Patient taking differently: Take 25-50 mg by mouth at bedtime as needed for sleep.)   triamcinolone cream (KENALOG) 0.1 % APPLY TO AFFECTED AREA TWICE A DAY (Patient taking differently: Apply 1 Application topically as needed (eczema).)   valsartan (DIOVAN) 160 MG tablet Take 1 tablet (160 mg total) by mouth daily.   fluocinolone (SYNALAR) 0.01 % external solution APPLY TOPICALLY 2 TIMES DAILY AS NEEDED. (Patient not taking: Reported on 05/25/2023)   levofloxacin (LEVAQUIN) 500 MG tablet Take 1 tablet (500 mg total) by mouth daily. (Patient not taking: Reported on 05/25/2023)   predniSONE (DELTASONE) 10 MG tablet Take 20mg  daily for 3days,Take 10mg  daily for 3days, then stop (Patient not taking: Reported on 05/25/2023)   No facility-administered encounter medications on file as of 05/25/2023.    Allergies (verified) Chlorthalidone and Benazepril   History: Past Medical History:  Diagnosis Date   ALLERGIC RHINITIS 12/09/2006   ANEMIA 01/14/2009   Anxiety    Arthritis    ASTHMA 12/09/2006   Cataract    bilateral -removed   CHF (congestive heart failure) (HCC)    Chronic pain syndrome 02/16/2009   COPD 12/09/2006   DEGENERATIVE  JOINT DISEASE 12/09/2006   DIZZINESS 03/30/2010   DYSPNEA 01/27/2010   with heavy exertion   FREQUENCY, URINARY 03/30/2010   GERD 12/09/2006   HELICOBACTER PYLORI GASTRITIS, HX OF 12/09/2006   HIATAL HERNIA 03/01/2010   HYPERLIPIDEMIA 04/18/2007   HYPERSOMNIA 08/31/2008   HYPERTENSION 12/09/2006   INSOMNIA-SLEEP DISORDER-UNSPEC 11/05/2008   LOW BACK PAIN 12/09/2006   Morbid obesity (HCC) 12/09/2006   Nonspecific (abnormal) findings on radiological and other examination of body structure 11/05/2008   OSTEOPOROSIS 12/09/2006   OVERACTIVE BLADDER 04/14/2008   PALPITATIONS, RECURRENT 01/27/2010   Stroke (HCC)    pt. stated light one years ago   SYNCOPE 11/05/2008   TRANSIENT ISCHEMIC ATTACK, HX OF 12/09/2006   Past Surgical History:  Procedure Laterality Date   BIOPSY  01/18/2023   Procedure:  BIOPSY;  Surgeon: Meryl Dare, MD;  Location: [facility];  Service: Gastroenterology;;   COLONOSCOPY     ESOPHAGOGASTRODUODENOSCOPY (EGD) WITH PROPOFOL N/A 01/18/2023   Procedure: ESOPHAGOGASTRODUODENOSCOPY (EGD) WITH PROPOFOL;  Surgeon: Meryl Dare, MD;  Location: [facility];  Service: Gastroenterology;  Laterality: N/A;   HOT HEMOSTASIS N/A 01/18/2023   Procedure: HOT HEMOSTASIS (ARGON PLASMA COAGULATION/BICAP);  Surgeon: Meryl Dare, MD;  Location: [facility];  Service: Gastroenterology;  Laterality: N/A;   LUMBAR DISC SURGERY     TONSILLECTOMY     as a child   TOTAL HIP ARTHROPLASTY Right 11/06/2012   Procedure: RIGHT TOTAL HIP ARTHROPLASTY WITH ACETABULAR AUTOGRAFT;  Surgeon: Loanne Drilling, MD;  Location: [facility];  Service: Orthopedics;  Laterality: Right;   TOTAL HIP ARTHROPLASTY Left 02/18/2014   Procedure: LEFT TOTAL HIP ARTHROPLASTY;  Surgeon: Loanne Drilling, MD;  Location: [facility];  Service: Orthopedics;  Laterality: Left;   TUBAL LIGATION     Family History  Problem Relation Age of Onset   Heart disease Mother        Had pacemaker   Lung cancer Father     Hypertension Brother    Diabetes Neg Hx    Colon cancer Neg Hx    Colon polyps Neg Hx    Kidney disease Neg Hx    Gallbladder disease Neg Hx    Esophageal cancer Neg Hx    Allergic rhinitis Neg Hx    Angioedema Neg Hx    Asthma Neg Hx    Atopy Neg Hx    Eczema Neg Hx    Immunodeficiency Neg Hx    Urticaria Neg Hx    Social History   Socioeconomic History   Marital status: Widowed    Spouse name: Not on file   Number of children: 10   Years of education: Not on file   Highest education level: Not on file  Occupational History   Occupation: Retired  Tobacco Use   Smoking status: Former    Current packs/day: 0.00    Average packs/day: 0.5 packs/day for 25.0 years (12.5 ttl pk-yrs)    Types: Cigarettes    Start date: 10/30/1947    Quit date: 10/29/1972    Years since quitting: 50.6   Smokeless tobacco: Never  Vaping Use   Vaping status: Never Used  Substance and Sexual Activity   Alcohol use: No    Alcohol/week: 0.0 standard drinks of alcohol   Drug use: No   Sexual activity: Not Currently  Other Topics Concern   Not on file  Social History Narrative   Right handed    Wears readers   Drinks 1 cup of coffee in am   Drinks soda 1 per day.   Social Drivers of Corporate investment banker Strain: Low Risk  (05/25/2023)   Overall Financial Resource Strain (CARDIA)    Difficulty of Paying Living Expenses: Not hard at all  Food Insecurity: No Food Insecurity (05/25/2023)   Hunger Vital Sign    Worried About Running Out of Food in the Last Year: Never true    Ran Out of Food in the Last Year: Never true  Transportation Needs: No Transportation Needs (05/25/2023)   PRAPARE - Administrator, Civil Service (Medical): No    Lack of Transportation (Non-Medical): No  Physical Activity: Insufficiently Active (05/25/2023)   Exercise Vital Sign    Days of Exercise per Week: 7 days    Minutes of Exercise per Session:  20 min  Stress: No Stress Concern Present (05/25/2023)    [employer] of Occupational Health - Occupational Stress Questionnaire    Feeling of Stress : Not at all  Social Connections: Moderately Isolated (05/25/2023)   Social Connection and Isolation Panel [NHANES]    Frequency of Communication with Friends and Family: More than three times a week    Frequency of Social Gatherings with Friends and Family: Never    Attends Religious Services: 1 to 4 times per year    Active Member of [employer] or Organizations: No    Attends Banker Meetings: Never    Marital Status: Widowed    Tobacco Counseling Counseling given: Not Answered   Clinical Intake:  Pre-visit preparation completed: Yes  Pain : 0-10 Pain Score: 3  Pain Type: Chronic pain Pain Location: Knee Pain Orientation: Left, Right Pain Descriptors / Indicators: Aching Pain Onset: More than a month ago Pain Frequency: Constant     Nutritional Risks: None Diabetes: No  How often do you need to have someone help you when you read instructions, pamphlets, or other written materials from your doctor or pharmacy?: 1 - Never  Interpreter Needed?: No  Information entered by :: NAllen LPN   Activities of Daily Living    05/25/2023    2:27 PM 01/15/2023    7:53 AM  In your present state of health, do you have any difficulty performing the following activities:  Hearing? 0 0  Vision? 0 0  Difficulty concentrating or making decisions? 1 0  Comment sometimes   Walking or climbing stairs? 1 0  Dressing or bathing? 1 1  Doing errands, shopping? 1 0  Preparing Food and eating ? N   Using the Toilet? N   In the past six months, have you accidently leaked urine? N   Do you have problems with loss of bowel control? N   Managing your Medications? Y   Comment daughter sets up   Managing your Finances? N   Housekeeping or managing your Housekeeping? Y     Patient Care Team: Corwin Levins, MD as PCP - General Thomasene Ripple, DO as PCP - Cardiology  (Cardiology) Lysle Rubens, OT as Occupational Therapist (Occupational Therapy) Rodrigo Ran, OD as Consulting Physician (Ophthalmology) Doree Albee, PA-C (Gastroenterology)  Indicate any recent Medical Services you may have received from other than [facility] providers in the past year (date may be approximate).     Assessment:   This is a routine wellness examination for Wendy Torres.  Hearing/Vision screen Hearing Screening - Comments:: Denies hearing issues Vision Screening - Comments:: No regular eye exams   Goals Addressed             This Visit's Progress    Patient Stated       05/25/2023, stay healthy       Depression Screen    05/25/2023    2:38 PM 04/30/2023    3:25 PM 01/25/2023   11:09 AM 12/25/2022   10:26 AM 12/21/2022    5:02 PM 11/09/2022    2:05 PM 06/23/2022    4:32 PM  PHQ 2/9 Scores  PHQ - 2 Score 0 0 0 0 0 0 0    Fall Risk    05/25/2023    2:37 PM 05/24/2023   10:59 AM 04/30/2023    3:25 PM 01/25/2023   11:08 AM 12/25/2022   10:26 AM  Fall Risk   Falls in the past year? 0 0 0 0  0  Number falls in past yr: 0  0 0 0  Injury with Fall? 0  0 0 0  Risk for fall due to : Impaired mobility;Impaired balance/gait;Medication side effect Impaired balance/gait;Impaired mobility No Fall Risks No Fall Risks No Fall Risks  Follow up Falls prevention discussed;Falls evaluation completed Falls evaluation completed Falls evaluation completed Falls evaluation completed Falls evaluation completed    MEDICARE RISK AT HOME: Medicare Risk at Home Any stairs in or around the home?: Yes (has a ramp) If so, are there any without handrails?: No Home free of loose throw rugs in walkways, pet beds, electrical cords, etc?: Yes Adequate lighting in your home to reduce risk of falls?: Yes Life alert?: No Use of a cane, walker or w/c?: Yes Grab bars in the bathroom?: Yes Shower chair or bench in shower?: Yes Elevated toilet seat or a handicapped toilet?: No  TIMED  UP AND GO:  Was the test performed?  No    Cognitive Function:        05/25/2023    2:39 PM 06/23/2022    4:35 PM  6CIT Screen  What Year? 0 points 0 points  What month? 0 points 0 points  What time? 0 points 0 points  Count back from 20 0 points 0 points  Months in reverse 4 points 0 points  Repeat phrase 10 points 0 points  Total Score 14 points 0 points    Immunizations Immunization History  Administered Date(s) Administered   Fluad Quad(high Dose 65+) 01/29/2021   Fluad Trivalent(High Dose 65+) 12/25/2022   Influenza Split 04/07/2011, 02/28/2012   Influenza Whole 04/15/2004, 04/18/2007, 01/27/2010   Influenza, High Dose Seasonal PF 02/04/2018, 12/10/2018   Influenza,inj,Quad PF,6+ Mos 03/16/2020   Influenza,inj,quad, With Preservative 01/29/2017   PFIZER(Purple Top)SARS-COV-2 Vaccination 09/14/2019, 10/12/2019   Pneumococcal Conjugate-13 12/03/2013   Pneumococcal Polysaccharide-23 05/02/2003, 09/29/2008, 12/10/2018   Tdap 04/07/2011    TDAP status: Due, Education has been provided regarding the importance of this vaccine. Advised may receive this vaccine at local pharmacy or Health Dept. Aware to provide a copy of the vaccination record if obtained from local pharmacy or Health Dept. Verbalized acceptance and understanding.  Flu Vaccine status: Up to date  Pneumococcal vaccine status: Up to date  Covid-19 vaccine status: Information provided on how to obtain vaccines.   Qualifies for Shingles Vaccine? Yes   Zostavax completed No   Shingrix Completed?: No.    Education has been provided regarding the importance of this vaccine. Patient has been advised to call insurance company to determine out of pocket expense if they have not yet received this vaccine. Advised may also receive vaccine at local pharmacy or Health Dept. Verbalized acceptance and understanding.  Screening Tests Health Maintenance  Topic Date Due   Zoster Vaccines- Shingrix (1 of 2) Never done    DTaP/Tdap/Td (2 - Td or Tdap) 04/06/2021   Medicare Annual Wellness (AWV)  05/24/2024   Pneumonia Vaccine 16+ Years old  Completed   INFLUENZA VACCINE  Completed   DEXA SCAN  Completed   HPV VACCINES  Aged Out   COVID-19 Vaccine  Discontinued    Health Maintenance  Health Maintenance Due  Topic Date Due   Zoster Vaccines- Shingrix (1 of 2) Never done   DTaP/Tdap/Td (2 - Td or Tdap) 04/06/2021    Colorectal cancer screening: No longer required.   Mammogram status: No longer required due to age.  Bone Density status: Completed 06/19/2012.   Lung Cancer Screening: (Low Dose  CT Chest recommended if Age 60-80 years, 20 pack-year currently smoking OR have quit w/in 15years.) does not qualify.   Lung Cancer Screening Referral: no  Additional Screening:  Hepatitis C Screening: does not qualify;  Vision Screening: Recommended annual ophthalmology exams for early detection of glaucoma and other disorders of the eye. Is the patient up to date with their annual eye exam?  No  Who is the provider or what is the name of the office in which the patient attends annual eye exams? none If pt is not established with a provider, would they like to be referred to a provider to establish care? No .   Dental Screening: Recommended annual dental exams for proper oral hygiene  Diabetic Foot Exam: n/a  Community Resource Referral / Chronic Care Management: CRR required this visit?  No   CCM required this visit?  No     Plan:     I have personally reviewed and noted the following in the patient's chart:   Medical and social history Use of alcohol, tobacco or illicit drugs  Current medications and supplements including opioid prescriptions. Patient is not currently taking opioid prescriptions. Functional ability and status Nutritional status Physical activity Advanced directives List of other physicians Hospitalizations, surgeries, and ER visits in previous 12  months Vitals Screenings to include cognitive, depression, and falls Referrals and appointments  In addition, I have reviewed and discussed with patient certain preventive protocols, quality metrics, and best practice recommendations. A written personalized care plan for preventive services as well as general preventive health recommendations were provided to patient.     Barb Merino, LPN   9/56/2130   After Visit Summary: (MyChart) Due to this being a telephonic visit, the after visit summary with patients personalized plan was offered to patient via MyChart   Nurse Notes: none

## 2023-06-04 ENCOUNTER — Other Ambulatory Visit: Payer: Self-pay | Admitting: Internal Medicine

## 2023-06-04 ENCOUNTER — Ambulatory Visit: Payer: Medicare Other | Admitting: Internal Medicine

## 2023-06-04 ENCOUNTER — Other Ambulatory Visit: Payer: Self-pay

## 2023-06-05 ENCOUNTER — Other Ambulatory Visit: Payer: Self-pay

## 2023-06-07 ENCOUNTER — Other Ambulatory Visit: Payer: Self-pay

## 2023-06-07 NOTE — Patient Outreach (Signed)
 Aging Gracefully Program  06/07/2023  Wendy Torres 04-25-1941 [id number]   [facility] Evaluation Interviewer made contact with patient. Aging Gracefully 9 month survey completed.     Shereen [facility]  Population Health Care Management Assistant  Direct Dial: [phone]  Fax: [phone] Website: [email or web address]

## 2023-06-08 ENCOUNTER — Other Ambulatory Visit: Payer: Self-pay | Admitting: Internal Medicine

## 2023-06-08 DIAGNOSIS — M150 Primary generalized (osteo)arthritis: Secondary | ICD-10-CM

## 2023-06-08 NOTE — Telephone Encounter (Signed)
 Patients daughter has been advised that I spoke to pharmacy and it was $47 and they were filling.

## 2023-06-09 ENCOUNTER — Other Ambulatory Visit: Payer: Self-pay | Admitting: Internal Medicine

## 2023-06-11 ENCOUNTER — Other Ambulatory Visit: Payer: Self-pay

## 2023-06-19 ENCOUNTER — Other Ambulatory Visit: Payer: Self-pay | Admitting: Oncology

## 2023-06-19 DIAGNOSIS — D509 Iron deficiency anemia, unspecified: Secondary | ICD-10-CM

## 2023-06-22 ENCOUNTER — Ambulatory Visit: Payer: Medicare Other | Admitting: Cardiology

## 2023-06-23 ENCOUNTER — Other Ambulatory Visit: Payer: Self-pay | Admitting: Internal Medicine

## 2023-06-25 ENCOUNTER — Other Ambulatory Visit: Payer: Self-pay

## 2023-06-25 ENCOUNTER — Encounter (INDEPENDENT_AMBULATORY_CARE_PROVIDER_SITE_OTHER): Payer: Medicare Other | Admitting: Neurology

## 2023-06-25 ENCOUNTER — Telehealth: Payer: Self-pay | Admitting: Neurology

## 2023-06-25 DIAGNOSIS — R519 Headache, unspecified: Secondary | ICD-10-CM

## 2023-06-25 DIAGNOSIS — I679 Cerebrovascular disease, unspecified: Secondary | ICD-10-CM

## 2023-06-25 DIAGNOSIS — M542 Cervicalgia: Secondary | ICD-10-CM | POA: Diagnosis not present

## 2023-06-25 NOTE — Telephone Encounter (Signed)
 Pt will be set up with [facility].

## 2023-06-25 NOTE — Telephone Encounter (Signed)
 I talked with her daughter Lady Gary, relayed MRI brain and cervical findings, MRI of cervical spine do show multilevel degenerative changes, mild to moderate canal stenosis at C3-4, C4-5, C5-6, C6-7 level  with variable degree of foraminal narrowing  I do not think she is a good surgical candidate  Patient still complains of frequent headaches, about couple times a week, was given tramadol, tizanidine by her primary care,  I will also refer her to home physical therapy  Previous evaluation demonstrated elevated ESR, with   normal C-reactive protein, in the setting of chronic lung issues, which can cause elevated ESR,  Will repeat parameter again to see the trend, remote possibility of temporal arteritis with her new onset headache, neck pain   IMPRESSION:   This MRI of the brain without contrast shows the following: Mild generalized cortical atrophy that is typical for age. Extensive T2/FLAIR hyperintense foci in the cerebral hemispheres and pons consistent with moderate chronic microvascular ischemic change, more than typical for age. The pituitary gland has a reduced height within an enlarged sella turcica consistent with a partially empty sella.  This is usually an incidental finding but can also be seen with elevated intracranial pressure. No acute findings.   IMPRESSION: This MRI of the cervical spine without contrast shows the following: The spinal cord appears normal. At C2-C3, there are degenerative changes causing moderate right and mild left foraminal narrowing but no spinal stenosis or nerve root compression. At C3-C4, there are degenerative changes causing moderate spinal stenosis and moderate bilateral foraminal narrowing.  There does not appear to be any definite nerve root compression. At C4-C5, there are degenerative changes causing moderate spinal stenosis and moderate left greater than right foraminal narrowing but no definite nerve root compression. At C5-C6, there are  degenerative changes causing mild spinal stenosis and mild to moderate foraminal narrowing but no nerve root compression. At C6-C7, there are degenerative changes causing moderate spinal stenosis and moderate right greater than left foraminal narrowing but no definite nerve root compression..    At C7-T1, there are degenerative changes causing mild spinal stenosis but no nerve root compression. Please see the MyChart message reply(ies) for my assessment and plan.    This patient gave consent for this Medical Advice Message and is aware that it may result in a bill to [employer], as well as the possibility of receiving a bill for a co-payment or deductible. They are an established patient, but are not seeking medical advice exclusively about a problem treated during an in person or video visit in the last seven days. I did not recommend an in person or video visit within seven days of my reply.    I spent a total of 10  minutes cumulative time within 7 days through [employer].  Levert Feinstein, MD

## 2023-06-26 ENCOUNTER — Telehealth: Payer: Self-pay | Admitting: Pulmonary Disease

## 2023-06-27 ENCOUNTER — Ambulatory Visit: Payer: Medicare Other | Admitting: Cardiology

## 2023-06-27 NOTE — Telephone Encounter (Signed)
 [facility] needs recent OV notes ordering HH. Can pt be added on for a quick VV? They aren't able to get her set up without visit notes.

## 2023-06-28 NOTE — Telephone Encounter (Signed)
 Call to patients daughter and she is in agreement for VV tomorrow at 48. Visit needed to [facility] for [facility]. Will need to fax notes from VV to [facility].

## 2023-06-28 NOTE — Telephone Encounter (Signed)
 Scheduled for vv tomorrow at 11am w/YAN. Leave call in box as reminder to fax note to [facility] as RN April stated

## 2023-06-29 ENCOUNTER — Other Ambulatory Visit: Payer: Self-pay | Admitting: Internal Medicine

## 2023-06-29 ENCOUNTER — Telehealth: Payer: Medicare Other | Admitting: Neurology

## 2023-06-29 DIAGNOSIS — M542 Cervicalgia: Secondary | ICD-10-CM | POA: Diagnosis not present

## 2023-06-29 DIAGNOSIS — I679 Cerebrovascular disease, unspecified: Secondary | ICD-10-CM

## 2023-06-29 DIAGNOSIS — R269 Unspecified abnormalities of gait and mobility: Secondary | ICD-10-CM | POA: Diagnosis not present

## 2023-06-29 DIAGNOSIS — R519 Headache, unspecified: Secondary | ICD-10-CM | POA: Diagnosis not present

## 2023-06-29 NOTE — Telephone Encounter (Signed)
 I let [facility] know she had a VV today.

## 2023-06-29 NOTE — Progress Notes (Signed)
 ASSESSMENT AND PLAN  Wendy Torres is a 83 y.o. female   New onset persistent headache Cerebrovascular disease  ESR C-reactive protein was elevated, on repeat test, normalization of C-reactive protein, improvement of ESR, which can be explained by her COPD, CT of the chest in October 2024 showed right lower lobe inflammation versus atelectasis emphysema, she denies jaw claudication, not enough evidence to support temporal arteritis  She still has occasionally headache, but overall has improved, will repeat ESR C-reactive protein  Cerebrovascular disease  MRI of the brain showed moderate small vessel disease  Echocardiogram showed ejection fraction 70 to 75%, hypertrophy,  Complete evaluation with ultrasound of carotid artery  Neck pain, gait abnormality MRI of cervical spine showed multilevel degenerative disease, moderate canal stenosis C3-4 variable degree of foraminal narrowing, Suggested neck stretching exercises, warm compression, not a good surgical candidate She is still able to ambulate with walker, referral to home physical therapy, have room to improve   DIAGNOSTIC DATA (LABS, IMAGING, TESTING) - I reviewed patient records, labs, notes, testing and imaging myself where available.   MEDICAL HISTORY:  Wendy Torres, is a 83 year old female, accompanied by her daughter Katrina seen in request by her primary care doctor Corwin Levins, for evaluation of constant headache, initial evaluation February 23, 2023  History is obtained from the patient and review of electronic medical records. I personally reviewed pertinent available imaging films in PACS.   PMHx of  COPD CHF Chronic insomnia HTN Anxiety HLD Previous smoker Stroke Hx of bilateral Hip replacement Chronic knee pain,   She lives at home with her family, significant breathing difficulty, rely on oxygen 24/7, also complains of significant gait abnormality due to bilateral knee pain, came in wheelchair  today,  She denies a previous history of headache, since beginning of 2024, she has been complaining daily headaches, mainly on the right side, starting at the right occipital region, also involving right lower jaw, constant achy pain, pressure, sometimes radiating pain to right lower jaw, no chewing difficulty, she has been wearing denture for many years,  She does have chronic neck pain, radiating pain to right shoulder  CT head without contrast August 2024 showed generalized atrophy extensive periventricular white matter disease  Lab in 2024, ferritin 17.7, iron level was decreased 32, hemoglobin of 9.7, is under the care of hematologist, CMP showed low potassium 3.2   Virtual Visit via video UPDATE April 11, 2023 management by telemedicine and the availability of in person appointments. The patient expressed understanding and agreed to proceed  Location: Provider: [facility] office; Patient: Home with her daughter  I connected with Jeffie Pollock  on April 11, 2023 by a video enabled telemedicine application and verified that I am speaking with the correct person using two identifiers.  UPDATED HiSTORY Over the past few months, her headache has improved, continue to have shoulder pain radiating to bilateral shoulder, gait abnormality, rely on her walker, COPD, oxygen 24/7, limited stamina  We screen shared and reviewed MRI of the brain from March 17, 2023 mild generalized atrophy, extensive moderate small vessel disease,  MRI of cervical spine showed multilevel degenerative changes, moderate canal stenosis at C3-4, variable degree of foraminal narrowing  Echocardiogram Showed ejection fraction 70 to 75%, no regional wall motion abnormality, asymmetric hypertrophy,   Virtual Visit via video UPDATE Jun 29 2023 I discussed the limitations of evaluation and management by telemedicine and the availability of in person appointments. The patient expressed understanding and agreed to  proceed  Location: Provider: [facility] office; Patient: Home with her daughter  I connected with SARANNE CRISLIP  on Jun 29 2023 by a video enabled telemedicine application and verified that I am speaking with the correct person using two identifiers.  UPDATED HiSTORY She continue to have occasionally headache but overall has improved, able to take few steps use a rollator at home,   Observations/Objective: I have reviewed problem lists, medications, allergies. Awake, alert, oriented to history taking and casual conversation, facial symmetric, no dysarthria, no aphasia, moving 4 extremity without difficulties, antigravity movement of bilateral lower extremity, gait was not evaluated  REVIEW OF SYSTEMS:  Full 14 system review of systems performed and notable only for as above All other review of systems were negative.   ALLERGIES: Allergies  Allergen Reactions   Chlorthalidone Shortness Of Breath   Benazepril Swelling    HOME MEDICATIONS: Current Outpatient Medications  Medication Sig Dispense Refill   acetaminophen (TYLENOL) 325 MG tablet Take 2 tablets (650 mg total) by mouth every 6 (six) hours as needed for mild pain (or Fever >/= 101). (Patient taking differently: Take 650 mg by mouth every 6 (six) hours as needed for mild pain (pain score 1-3).) 40 tablet 0   albuterol (VENTOLIN HFA) 108 (90 Base) MCG/ACT inhaler Inhale 2 puffs into the lungs every 4 (four) hours as needed for wheezing or shortness of breath. 8.5 each 1   ALPRAZolam (XANAX) 0.25 MG tablet TAKE 1 TABLET BY MOUTH TWICE A DAY AS NEEDED 60 tablet 2   amLODipine (NORVASC) 10 MG tablet Take 1 tablet (10 mg total) by mouth daily. 90 tablet 3   aspirin EC 81 MG tablet Take 1 tablet (81 mg total) by mouth daily. Swallow whole.     Budeson-Glycopyrrol-Formoterol (BREZTRI AEROSPHERE) 160-9-4.8 MCG/ACT AERO Inhale 2 puffs into the lungs in the morning and at bedtime. 3 each 3   cloNIDine (CATAPRES) 0.2 MG tablet Take 1 tablet  (0.2 mg total) by mouth 2 (two) times daily. 180 tablet 3   Fexofenadine HCl (MUCINEX ALLERGY PO) Take 1 tablet by mouth as needed (congestion).     fluocinolone (SYNALAR) 0.01 % external solution APPLY TOPICALLY 2 TIMES DAILY AS NEEDED. (Patient not taking: Reported on 05/25/2023) 60 mL 0   furosemide (LASIX) 40 MG tablet Take 1 tablet (40 mg total) by mouth daily. 90 tablet 3   gabapentin (NEURONTIN) 600 MG tablet Take 1 tablet (600 mg total) by mouth 3 (three) times daily. 270 tablet 1   guaiFENesin (MUCINEX) 600 MG 12 hr tablet Take 1 tablet (600 mg total) by mouth 2 (two) times daily. 30 tablet 0   hydrALAZINE (APRESOLINE) 100 MG tablet Take 1 tablet (100 mg total) by mouth 3 (three) times daily. 270 tablet 3   iron polysaccharides (NIFEREX) 150 MG capsule TAKE 1 CAPSULE BY MOUTH EVERY DAY 90 capsule 1   levofloxacin (LEVAQUIN) 500 MG tablet Take 1 tablet (500 mg total) by mouth daily. (Patient not taking: Reported on 05/25/2023) 10 tablet 0   lovastatin (MEVACOR) 40 MG tablet TAKE 1 TABLET BY MOUTH AT  BEDTIME 100 tablet 2   meclizine (ANTIVERT) 12.5 MG tablet TAKE 1 TABLET BY MOUTH THREE TIMES A DAY AS NEEDED FOR DIZZINESS 30 tablet 2   nystatin (MYCOSTATIN) 100000 UNIT/ML suspension Swish and spit 5mL two times daily after using Symbicort. (Patient taking differently: Use as directed 5 mLs in the mouth or throat in the morning and at bedtime. Swish and spit) 473 mL  5   pantoprazole (PROTONIX) 40 MG tablet TAKE 1 TABLET BY MOUTH DAILY 90 tablet 3   polyethylene glycol (MIRALAX / GLYCOLAX) 17 g packet Take 17 g by mouth daily as needed for mild constipation or moderate constipation.     potassium chloride SA (KLOR-CON M20) 20 MEQ tablet Take 1 tablet (20 mEq total) by mouth daily. 90 tablet 3   predniSONE (DELTASONE) 10 MG tablet Take 20mg  daily for 3days,Take 10mg  daily for 3days, then stop (Patient not taking: Reported on 05/25/2023) 10 tablet 0   solifenacin (VESICARE) 10 MG tablet TAKE 1  TABLET BY MOUTH EVERY DAY 90 tablet 1   tiZANidine (ZANAFLEX) 4 MG tablet TAKE 1 TABLET BY MOUTH EVERY 6 HOURS AS NEEDED FOR MUSCLE SPASMS. 50 tablet 1   traMADol (ULTRAM) 50 MG tablet TAKE 1 TABLET BY MOUTH EVERY 6 HOURS AS NEEDED FOR PAIN 120 tablet 2   traZODone (DESYREL) 50 MG tablet TAKE 1/2 TO 1 TABLET BY MOUTH AT BEDTIME AS NEEDED FOR SLEEP (Patient taking differently: Take 25-50 mg by mouth at bedtime as needed for sleep.) 90 tablet 1   triamcinolone cream (KENALOG) 0.1 % APPLY TO AFFECTED AREA TWICE A DAY (Patient taking differently: Apply 1 Application topically as needed (eczema).) 30 g 1   valsartan (DIOVAN) 160 MG tablet Take 1 tablet (160 mg total) by mouth daily. 90 tablet 3   No current facility-administered medications for this visit.    PAST MEDICAL HISTORY: Past Medical History:  Diagnosis Date   ALLERGIC RHINITIS 12/09/2006   ANEMIA 01/14/2009   Anxiety    Arthritis    ASTHMA 12/09/2006   Cataract    bilateral -removed   CHF (congestive heart failure) (HCC)    Chronic pain syndrome 02/16/2009   COPD 12/09/2006   DEGENERATIVE JOINT DISEASE 12/09/2006   DIZZINESS 03/30/2010   DYSPNEA 01/27/2010   with heavy exertion   FREQUENCY, URINARY 03/30/2010   GERD 12/09/2006   HELICOBACTER PYLORI GASTRITIS, HX OF 12/09/2006   HIATAL HERNIA 03/01/2010   HYPERLIPIDEMIA 04/18/2007   HYPERSOMNIA 08/31/2008   HYPERTENSION 12/09/2006   INSOMNIA-SLEEP DISORDER-UNSPEC 11/05/2008   LOW BACK PAIN 12/09/2006   Morbid obesity (HCC) 12/09/2006   Nonspecific (abnormal) findings on radiological and other examination of body structure 11/05/2008   OSTEOPOROSIS 12/09/2006   OVERACTIVE BLADDER 04/14/2008   PALPITATIONS, RECURRENT 01/27/2010   Stroke (HCC)    pt. stated light one years ago   SYNCOPE 11/05/2008   TRANSIENT ISCHEMIC ATTACK, HX OF 12/09/2006    PAST SURGICAL HISTORY: Past Surgical History:  Procedure Laterality Date   BIOPSY  01/18/2023   Procedure: BIOPSY;   Surgeon: Meryl Dare, MD;  Location: [facility];  Service: Gastroenterology;;   COLONOSCOPY     ESOPHAGOGASTRODUODENOSCOPY (EGD) WITH PROPOFOL N/A 01/18/2023   Procedure: ESOPHAGOGASTRODUODENOSCOPY (EGD) WITH PROPOFOL;  Surgeon: Meryl Dare, MD;  Location: [facility];  Service: Gastroenterology;  Laterality: N/A;   HOT HEMOSTASIS N/A 01/18/2023   Procedure: HOT HEMOSTASIS (ARGON PLASMA COAGULATION/BICAP);  Surgeon: Meryl Dare, MD;  Location: [facility];  Service: Gastroenterology;  Laterality: N/A;   LUMBAR DISC SURGERY     TONSILLECTOMY     as a child   TOTAL HIP ARTHROPLASTY Right 11/06/2012   Procedure: RIGHT TOTAL HIP ARTHROPLASTY WITH ACETABULAR AUTOGRAFT;  Surgeon: Loanne Drilling, MD;  Location: [facility];  Service: Orthopedics;  Laterality: Right;   TOTAL HIP ARTHROPLASTY Left 02/18/2014   Procedure: LEFT TOTAL HIP ARTHROPLASTY;  Surgeon: Loanne Drilling,  MD;  Location: [facility];  Service: Orthopedics;  Laterality: Left;   TUBAL LIGATION      FAMILY HISTORY: Family History  Problem Relation Age of Onset   Heart disease Mother        Had pacemaker   Lung cancer Father    Hypertension Brother    Diabetes Neg Hx    Colon cancer Neg Hx    Colon polyps Neg Hx    Kidney disease Neg Hx    Gallbladder disease Neg Hx    Esophageal cancer Neg Hx    Allergic rhinitis Neg Hx    Angioedema Neg Hx    Asthma Neg Hx    Atopy Neg Hx    Eczema Neg Hx    Immunodeficiency Neg Hx    Urticaria Neg Hx     SOCIAL HISTORY: Social History   Socioeconomic History   Marital status: Widowed    Spouse name: Not on file   Number of children: 10   Years of education: Not on file   Highest education level: Not on file  Occupational History   Occupation: Retired  Tobacco Use   Smoking status: Former    Current packs/day: 0.00    Average packs/day: 0.5 packs/day for 25.0 years (12.5 ttl pk-yrs)    Types: Cigarettes    Start date: 10/30/1947    Quit date: 10/29/1972    Years  since quitting: 50.6   Smokeless tobacco: Never  Vaping Use   Vaping status: Never Used  Substance and Sexual Activity   Alcohol use: No    Alcohol/week: 0.0 standard drinks of alcohol   Drug use: No   Sexual activity: Not Currently  Other Topics Concern   Not on file  Social History Narrative   Right handed    Wears readers   Drinks 1 cup of coffee in am   Drinks soda 1 per day.   Social Drivers of Corporate investment banker Strain: Low Risk  (05/25/2023)   Overall Financial Resource Strain (CARDIA)    Difficulty of Paying Living Expenses: Not hard at all  Food Insecurity: No Food Insecurity (05/25/2023)   Hunger Vital Sign    Worried About Running Out of Food in the Last Year: Never true    Ran Out of Food in the Last Year: Never true  Transportation Needs: No Transportation Needs (05/25/2023)   PRAPARE - Administrator, Civil Service (Medical): No    Lack of Transportation (Non-Medical): No  Physical Activity: Insufficiently Active (05/25/2023)   Exercise Vital Sign    Days of Exercise per Week: 7 days    Minutes of Exercise per Session: 20 min  Stress: No Stress Concern Present (05/25/2023)   [employer] of Occupational Health - Occupational Stress Questionnaire    Feeling of Stress : Not at all  Social Connections: Moderately Isolated (05/25/2023)   Social Connection and Isolation Panel [NHANES]    Frequency of Communication with Friends and Family: More than three times a week    Frequency of Social Gatherings with Friends and Family: Never    Attends Religious Services: 1 to 4 times per year    Active Member of [employer] or Organizations: No    Attends Banker Meetings: Never    Marital Status: Widowed  Intimate Partner Violence: Not At Risk (05/25/2023)   Humiliation, Afraid, Rape, and Kick questionnaire    Fear of Current or Ex-Partner: No    Emotionally Abused: No  Physically Abused: No    Sexually Abused: No      Levert Feinstein, M.D.  Ph.D.  [facility] [address] [place], [place] [address] Ph: [phone] Fax: [phone]  CC:  Corwin Levins, MD [address] [place],  [place] [address]  Corwin Levins, MD

## 2023-06-30 ENCOUNTER — Encounter: Payer: Self-pay | Admitting: Internal Medicine

## 2023-07-02 ENCOUNTER — Other Ambulatory Visit: Payer: Self-pay

## 2023-07-02 NOTE — Telephone Encounter (Signed)
 Faxed off Office notes to [facility] [phone] on 07/02/2023.

## 2023-07-04 DIAGNOSIS — R911 Solitary pulmonary nodule: Secondary | ICD-10-CM | POA: Diagnosis not present

## 2023-07-04 DIAGNOSIS — J9611 Chronic respiratory failure with hypoxia: Secondary | ICD-10-CM | POA: Diagnosis not present

## 2023-07-04 DIAGNOSIS — M179 Osteoarthritis of knee, unspecified: Secondary | ICD-10-CM | POA: Diagnosis not present

## 2023-07-04 DIAGNOSIS — Q2733 Arteriovenous malformation of digestive system vessel: Secondary | ICD-10-CM | POA: Diagnosis not present

## 2023-07-04 DIAGNOSIS — J439 Emphysema, unspecified: Secondary | ICD-10-CM | POA: Diagnosis not present

## 2023-07-04 DIAGNOSIS — I5032 Chronic diastolic (congestive) heart failure: Secondary | ICD-10-CM | POA: Diagnosis not present

## 2023-07-04 DIAGNOSIS — M5416 Radiculopathy, lumbar region: Secondary | ICD-10-CM | POA: Diagnosis not present

## 2023-07-04 DIAGNOSIS — M5410 Radiculopathy, site unspecified: Secondary | ICD-10-CM | POA: Diagnosis not present

## 2023-07-04 DIAGNOSIS — N3281 Overactive bladder: Secondary | ICD-10-CM | POA: Diagnosis not present

## 2023-07-04 DIAGNOSIS — K219 Gastro-esophageal reflux disease without esophagitis: Secondary | ICD-10-CM | POA: Diagnosis not present

## 2023-07-04 DIAGNOSIS — M4802 Spinal stenosis, cervical region: Secondary | ICD-10-CM | POA: Diagnosis not present

## 2023-07-04 DIAGNOSIS — G4710 Hypersomnia, unspecified: Secondary | ICD-10-CM | POA: Diagnosis not present

## 2023-07-04 DIAGNOSIS — E785 Hyperlipidemia, unspecified: Secondary | ICD-10-CM | POA: Diagnosis not present

## 2023-07-04 DIAGNOSIS — J4489 Other specified chronic obstructive pulmonary disease: Secondary | ICD-10-CM | POA: Diagnosis not present

## 2023-07-04 DIAGNOSIS — I110 Hypertensive heart disease with heart failure: Secondary | ICD-10-CM | POA: Diagnosis not present

## 2023-07-04 DIAGNOSIS — E559 Vitamin D deficiency, unspecified: Secondary | ICD-10-CM | POA: Diagnosis not present

## 2023-07-04 DIAGNOSIS — D649 Anemia, unspecified: Secondary | ICD-10-CM | POA: Diagnosis not present

## 2023-07-04 DIAGNOSIS — H9193 Unspecified hearing loss, bilateral: Secondary | ICD-10-CM | POA: Diagnosis not present

## 2023-07-04 DIAGNOSIS — K449 Diaphragmatic hernia without obstruction or gangrene: Secondary | ICD-10-CM | POA: Diagnosis not present

## 2023-07-04 DIAGNOSIS — I272 Pulmonary hypertension, unspecified: Secondary | ICD-10-CM | POA: Diagnosis not present

## 2023-07-04 DIAGNOSIS — M810 Age-related osteoporosis without current pathological fracture: Secondary | ICD-10-CM | POA: Diagnosis not present

## 2023-07-06 ENCOUNTER — Telehealth: Payer: Self-pay

## 2023-07-06 NOTE — Telephone Encounter (Signed)
 Copied from CRM [id number]. Topic: Clinical - Home Health Verbal Orders >> Jul 06, 2023  3:21 PM [place] E wrote: Caller/Agency: Natalia Leatherwood with [facility] Number: [phone] Service Requested: Physical Therapy Frequency: 1x a week for the first week, 2x a week for the next for 4 weeks, and then 1x a week for the last 4 weeks. Any new concerns about the patient? No, but requesting a nurse evaluation.

## 2023-07-09 NOTE — Telephone Encounter (Signed)
 Ok for verbal for both if that is ok

## 2023-07-10 ENCOUNTER — Encounter: Payer: Self-pay | Admitting: Pulmonary Disease

## 2023-07-10 NOTE — Telephone Encounter (Signed)
 Called and left voicemail giving verbals.

## 2023-07-11 ENCOUNTER — Telehealth: Payer: Medicare Other | Admitting: Neurology

## 2023-07-13 DIAGNOSIS — E559 Vitamin D deficiency, unspecified: Secondary | ICD-10-CM | POA: Diagnosis not present

## 2023-07-13 DIAGNOSIS — N3281 Overactive bladder: Secondary | ICD-10-CM | POA: Diagnosis not present

## 2023-07-13 DIAGNOSIS — K219 Gastro-esophageal reflux disease without esophagitis: Secondary | ICD-10-CM | POA: Diagnosis not present

## 2023-07-13 DIAGNOSIS — J439 Emphysema, unspecified: Secondary | ICD-10-CM | POA: Diagnosis not present

## 2023-07-13 DIAGNOSIS — Q2733 Arteriovenous malformation of digestive system vessel: Secondary | ICD-10-CM | POA: Diagnosis not present

## 2023-07-13 DIAGNOSIS — J9611 Chronic respiratory failure with hypoxia: Secondary | ICD-10-CM | POA: Diagnosis not present

## 2023-07-13 DIAGNOSIS — D649 Anemia, unspecified: Secondary | ICD-10-CM | POA: Diagnosis not present

## 2023-07-13 DIAGNOSIS — M4802 Spinal stenosis, cervical region: Secondary | ICD-10-CM | POA: Diagnosis not present

## 2023-07-13 DIAGNOSIS — G4710 Hypersomnia, unspecified: Secondary | ICD-10-CM | POA: Diagnosis not present

## 2023-07-13 DIAGNOSIS — J4489 Other specified chronic obstructive pulmonary disease: Secondary | ICD-10-CM | POA: Diagnosis not present

## 2023-07-13 DIAGNOSIS — M810 Age-related osteoporosis without current pathological fracture: Secondary | ICD-10-CM | POA: Diagnosis not present

## 2023-07-13 DIAGNOSIS — K449 Diaphragmatic hernia without obstruction or gangrene: Secondary | ICD-10-CM | POA: Diagnosis not present

## 2023-07-13 DIAGNOSIS — M5410 Radiculopathy, site unspecified: Secondary | ICD-10-CM | POA: Diagnosis not present

## 2023-07-13 DIAGNOSIS — I5032 Chronic diastolic (congestive) heart failure: Secondary | ICD-10-CM | POA: Diagnosis not present

## 2023-07-13 DIAGNOSIS — M179 Osteoarthritis of knee, unspecified: Secondary | ICD-10-CM | POA: Diagnosis not present

## 2023-07-13 DIAGNOSIS — R911 Solitary pulmonary nodule: Secondary | ICD-10-CM | POA: Diagnosis not present

## 2023-07-13 DIAGNOSIS — E785 Hyperlipidemia, unspecified: Secondary | ICD-10-CM | POA: Diagnosis not present

## 2023-07-13 DIAGNOSIS — I110 Hypertensive heart disease with heart failure: Secondary | ICD-10-CM | POA: Diagnosis not present

## 2023-07-13 DIAGNOSIS — H9193 Unspecified hearing loss, bilateral: Secondary | ICD-10-CM | POA: Diagnosis not present

## 2023-07-13 DIAGNOSIS — M5416 Radiculopathy, lumbar region: Secondary | ICD-10-CM | POA: Diagnosis not present

## 2023-07-13 DIAGNOSIS — I272 Pulmonary hypertension, unspecified: Secondary | ICD-10-CM | POA: Diagnosis not present

## 2023-07-14 DIAGNOSIS — Q2733 Arteriovenous malformation of digestive system vessel: Secondary | ICD-10-CM | POA: Diagnosis not present

## 2023-07-14 DIAGNOSIS — M5410 Radiculopathy, site unspecified: Secondary | ICD-10-CM | POA: Diagnosis not present

## 2023-07-14 DIAGNOSIS — E785 Hyperlipidemia, unspecified: Secondary | ICD-10-CM | POA: Diagnosis not present

## 2023-07-14 DIAGNOSIS — K449 Diaphragmatic hernia without obstruction or gangrene: Secondary | ICD-10-CM | POA: Diagnosis not present

## 2023-07-14 DIAGNOSIS — M5416 Radiculopathy, lumbar region: Secondary | ICD-10-CM | POA: Diagnosis not present

## 2023-07-14 DIAGNOSIS — J439 Emphysema, unspecified: Secondary | ICD-10-CM | POA: Diagnosis not present

## 2023-07-14 DIAGNOSIS — K219 Gastro-esophageal reflux disease without esophagitis: Secondary | ICD-10-CM | POA: Diagnosis not present

## 2023-07-14 DIAGNOSIS — G4710 Hypersomnia, unspecified: Secondary | ICD-10-CM | POA: Diagnosis not present

## 2023-07-14 DIAGNOSIS — M4802 Spinal stenosis, cervical region: Secondary | ICD-10-CM | POA: Diagnosis not present

## 2023-07-14 DIAGNOSIS — I272 Pulmonary hypertension, unspecified: Secondary | ICD-10-CM | POA: Diagnosis not present

## 2023-07-14 DIAGNOSIS — M179 Osteoarthritis of knee, unspecified: Secondary | ICD-10-CM | POA: Diagnosis not present

## 2023-07-14 DIAGNOSIS — I110 Hypertensive heart disease with heart failure: Secondary | ICD-10-CM | POA: Diagnosis not present

## 2023-07-14 DIAGNOSIS — M810 Age-related osteoporosis without current pathological fracture: Secondary | ICD-10-CM | POA: Diagnosis not present

## 2023-07-14 DIAGNOSIS — I5032 Chronic diastolic (congestive) heart failure: Secondary | ICD-10-CM | POA: Diagnosis not present

## 2023-07-14 DIAGNOSIS — J4489 Other specified chronic obstructive pulmonary disease: Secondary | ICD-10-CM | POA: Diagnosis not present

## 2023-07-14 DIAGNOSIS — R911 Solitary pulmonary nodule: Secondary | ICD-10-CM | POA: Diagnosis not present

## 2023-07-14 DIAGNOSIS — D649 Anemia, unspecified: Secondary | ICD-10-CM | POA: Diagnosis not present

## 2023-07-14 DIAGNOSIS — J9611 Chronic respiratory failure with hypoxia: Secondary | ICD-10-CM | POA: Diagnosis not present

## 2023-07-14 DIAGNOSIS — E559 Vitamin D deficiency, unspecified: Secondary | ICD-10-CM | POA: Diagnosis not present

## 2023-07-14 DIAGNOSIS — H9193 Unspecified hearing loss, bilateral: Secondary | ICD-10-CM | POA: Diagnosis not present

## 2023-07-14 DIAGNOSIS — N3281 Overactive bladder: Secondary | ICD-10-CM | POA: Diagnosis not present

## 2023-07-16 ENCOUNTER — Telehealth: Payer: Self-pay | Admitting: Internal Medicine

## 2023-07-16 ENCOUNTER — Telehealth: Payer: Self-pay

## 2023-07-16 NOTE — Telephone Encounter (Signed)
 Copied from CRM [id number]. Topic: Clinical - Home Health Verbal Orders >> Jul 16, 2023  1:34 PM [place] J wrote: Caller/Agency: [facility] / [facility] Callback Number: [phone] Voicemail box is secured and verbal orders can be given through there. Service Requested: Skilled Nursing Frequency: 1 week 4; 2 month 1; 2 PRNS  Any new concerns about the patient? No

## 2023-07-17 DIAGNOSIS — G4710 Hypersomnia, unspecified: Secondary | ICD-10-CM | POA: Diagnosis not present

## 2023-07-17 DIAGNOSIS — H9193 Unspecified hearing loss, bilateral: Secondary | ICD-10-CM | POA: Diagnosis not present

## 2023-07-17 DIAGNOSIS — K449 Diaphragmatic hernia without obstruction or gangrene: Secondary | ICD-10-CM | POA: Diagnosis not present

## 2023-07-17 DIAGNOSIS — Q2733 Arteriovenous malformation of digestive system vessel: Secondary | ICD-10-CM | POA: Diagnosis not present

## 2023-07-17 DIAGNOSIS — K219 Gastro-esophageal reflux disease without esophagitis: Secondary | ICD-10-CM | POA: Diagnosis not present

## 2023-07-17 DIAGNOSIS — I110 Hypertensive heart disease with heart failure: Secondary | ICD-10-CM | POA: Diagnosis not present

## 2023-07-17 DIAGNOSIS — R911 Solitary pulmonary nodule: Secondary | ICD-10-CM | POA: Diagnosis not present

## 2023-07-17 DIAGNOSIS — J4489 Other specified chronic obstructive pulmonary disease: Secondary | ICD-10-CM | POA: Diagnosis not present

## 2023-07-17 DIAGNOSIS — N3281 Overactive bladder: Secondary | ICD-10-CM | POA: Diagnosis not present

## 2023-07-17 DIAGNOSIS — D649 Anemia, unspecified: Secondary | ICD-10-CM | POA: Diagnosis not present

## 2023-07-17 DIAGNOSIS — M810 Age-related osteoporosis without current pathological fracture: Secondary | ICD-10-CM | POA: Diagnosis not present

## 2023-07-17 DIAGNOSIS — M179 Osteoarthritis of knee, unspecified: Secondary | ICD-10-CM | POA: Diagnosis not present

## 2023-07-17 DIAGNOSIS — E785 Hyperlipidemia, unspecified: Secondary | ICD-10-CM | POA: Diagnosis not present

## 2023-07-17 DIAGNOSIS — I272 Pulmonary hypertension, unspecified: Secondary | ICD-10-CM | POA: Diagnosis not present

## 2023-07-17 DIAGNOSIS — M5416 Radiculopathy, lumbar region: Secondary | ICD-10-CM | POA: Diagnosis not present

## 2023-07-17 DIAGNOSIS — J439 Emphysema, unspecified: Secondary | ICD-10-CM | POA: Diagnosis not present

## 2023-07-17 DIAGNOSIS — M4802 Spinal stenosis, cervical region: Secondary | ICD-10-CM | POA: Diagnosis not present

## 2023-07-17 DIAGNOSIS — I5032 Chronic diastolic (congestive) heart failure: Secondary | ICD-10-CM | POA: Diagnosis not present

## 2023-07-17 DIAGNOSIS — J9611 Chronic respiratory failure with hypoxia: Secondary | ICD-10-CM | POA: Diagnosis not present

## 2023-07-17 DIAGNOSIS — M5410 Radiculopathy, site unspecified: Secondary | ICD-10-CM | POA: Diagnosis not present

## 2023-07-17 DIAGNOSIS — E559 Vitamin D deficiency, unspecified: Secondary | ICD-10-CM | POA: Diagnosis not present

## 2023-07-17 NOTE — Telephone Encounter (Signed)
 Ok for verbal

## 2023-07-17 NOTE — Telephone Encounter (Signed)
 Ok for [employer]

## 2023-07-18 NOTE — Telephone Encounter (Signed)
 Called and left voicemail giving verbals.

## 2023-07-19 ENCOUNTER — Encounter: Payer: Self-pay | Admitting: Oncology

## 2023-07-19 ENCOUNTER — Inpatient Hospital Stay: Payer: Medicare Other | Attending: Oncology

## 2023-07-19 ENCOUNTER — Inpatient Hospital Stay: Payer: Medicare Other | Admitting: Oncology

## 2023-07-19 VITALS — BP 157/60 | HR 66 | Temp 97.7°F | Resp 18 | Ht 64.0 in | Wt 202.2 lb

## 2023-07-19 DIAGNOSIS — J449 Chronic obstructive pulmonary disease, unspecified: Secondary | ICD-10-CM | POA: Insufficient documentation

## 2023-07-19 DIAGNOSIS — D509 Iron deficiency anemia, unspecified: Secondary | ICD-10-CM | POA: Insufficient documentation

## 2023-07-19 DIAGNOSIS — R519 Headache, unspecified: Secondary | ICD-10-CM | POA: Diagnosis not present

## 2023-07-19 DIAGNOSIS — R768 Other specified abnormal immunological findings in serum: Secondary | ICD-10-CM | POA: Diagnosis not present

## 2023-07-19 DIAGNOSIS — Z87891 Personal history of nicotine dependence: Secondary | ICD-10-CM | POA: Diagnosis not present

## 2023-07-19 LAB — CBC WITH DIFFERENTIAL (CANCER CENTER ONLY)
Abs Immature Granulocytes: 0 10*3/uL (ref 0.00–0.07)
Basophils Absolute: 0.1 10*3/uL (ref 0.0–0.1)
Basophils Relative: 1 %
Eosinophils Absolute: 0.1 10*3/uL (ref 0.0–0.5)
Eosinophils Relative: 2 %
HCT: 34.2 % — ABNORMAL LOW (ref 36.0–46.0)
Hemoglobin: 10.7 g/dL — ABNORMAL LOW (ref 12.0–15.0)
Immature Granulocytes: 0 %
Lymphocytes Relative: 28 %
Lymphs Abs: 1.9 10*3/uL (ref 0.7–4.0)
MCH: 27.2 pg (ref 26.0–34.0)
MCHC: 31.3 g/dL (ref 30.0–36.0)
MCV: 86.8 fL (ref 80.0–100.0)
Monocytes Absolute: 0.8 10*3/uL (ref 0.1–1.0)
Monocytes Relative: 12 %
Neutro Abs: 3.9 10*3/uL (ref 1.7–7.7)
Neutrophils Relative %: 57 %
Platelet Count: 288 10*3/uL (ref 150–400)
RBC: 3.94 MIL/uL (ref 3.87–5.11)
RDW: 19.1 % — ABNORMAL HIGH (ref 11.5–15.5)
WBC Count: 6.8 10*3/uL (ref 4.0–10.5)
nRBC: 0 % (ref 0.0–0.2)

## 2023-07-19 LAB — CMP (CANCER CENTER ONLY)
ALT: 9 U/L (ref 0–44)
AST: 14 U/L — ABNORMAL LOW (ref 15–41)
Albumin: 4.3 g/dL (ref 3.5–5.0)
Alkaline Phosphatase: 66 U/L (ref 38–126)
Anion gap: 4 — ABNORMAL LOW (ref 5–15)
BUN: 17 mg/dL (ref 8–23)
CO2: 33 mmol/L — ABNORMAL HIGH (ref 22–32)
Calcium: 10.8 mg/dL — ABNORMAL HIGH (ref 8.9–10.3)
Chloride: 100 mmol/L (ref 98–111)
Creatinine: 0.97 mg/dL (ref 0.44–1.00)
GFR, Estimated: 58 mL/min — ABNORMAL LOW (ref >60)
Glucose, Bld: 112 mg/dL — ABNORMAL HIGH (ref 70–99)
Potassium: 4 mmol/L (ref 3.5–5.1)
Sodium: 137 mmol/L (ref 135–145)
Total Bilirubin: 0.2 mg/dL (ref 0.0–1.2)
Total Protein: 8.1 g/dL (ref 6.5–8.1)

## 2023-07-19 LAB — IRON AND IRON BINDING CAPACITY (CC-WL,HP ONLY)
Iron: 48 ug/dL (ref 28–170)
Saturation Ratios: 15 % (ref 10.4–31.8)
TIBC: 329 ug/dL (ref 250–450)
UIBC: 281 ug/dL (ref 148–442)

## 2023-07-19 NOTE — Assessment & Plan Note (Addendum)
 Hemoglobin levels have been consistently low for several years, with the lowest recorded value being 8.6 in 2015. No known blood loss. Patient has been on oral iron supplements for an extended period.   On her initial consultation with [place] on 04/19/2023, labs revealed persistent anemia with hemoglobin of 8.8, hematocrit 28.8, MCV 78.7.  White count and platelet count were within normal limits.  CMP showed creatinine of 1.01, calcium 10.9, total protein 8.1, albumin 4.3.  Iron studies showed evidence of iron deficiency with decreased iron saturation of 5%, decreased iron of 23.  Ferritin was decreased at 8.  Vitamin B12, folic acid, LDH were all normal.   Given persistent iron deficiency anemia, despite oral iron supplementation, we treated her with IV iron with Feraheme x 2 doses, 1 week apart.   Labs today showed improved hemoglobin of 10.7, MCV 86.8.  White count and platelet count are within normal limits.  Calcium 10.8, otherwise unremarkable CMP.  Iron studies show no evidence of iron deficiency today.  Ferritin pending.  She was advised to continue oral iron supplements for now.  If persistent iron deficiency is noted, we will consider repeating another course of IV iron.   -Iron deficiency is presumably from GI blood loss.  Apparently FOBT was negative recently.  Patient cannot undergo colonoscopy because of her medical comorbidities.  Hence we will monitor this for now.

## 2023-07-19 NOTE — Progress Notes (Signed)
 [facility]  HEMATOLOGY CLINIC PROGRESS NOTE  PATIENT NAME: Wendy Torres   MR#: [id number] DOB: [birth year]  Patient Care Team: Corwin Levins, MD as PCP - General Thomasene Ripple, DO as PCP - Cardiology (Cardiology) Lysle Rubens, OT as Occupational Therapist (Occupational Therapy) Rodrigo Ran, OD as Consulting Physician (Ophthalmology) Javier Glazier (Gastroenterology)  Date of visit: 07/19/2023   ASSESSMENT & PLAN:   Wendy Torres is a 83 y.o. lady with a past medical history of hypertension, dyslipidemia, CHF, COPD, arthritis, H. pylori gastritis, was referred to our service for evaluation of anemia.  Workup was consistent with iron deficiency anemia.  Treated with IV iron.  Iron deficiency anemia Hemoglobin levels have been consistently low for several years, with the lowest recorded value being 8.6 in 2015. No known blood loss. Patient has been on oral iron supplements for an extended period.   On her initial consultation with [place] on 04/19/2023, labs revealed persistent anemia with hemoglobin of 8.8, hematocrit 28.8, MCV 78.7.  White count and platelet count were within normal limits.  CMP showed creatinine of 1.01, calcium 10.9, total protein 8.1, albumin 4.3.  Iron studies showed evidence of iron deficiency with decreased iron saturation of 5%, decreased iron of 23.  Ferritin was decreased at 8.  Vitamin B12, folic acid, LDH were all normal.   Given persistent iron deficiency anemia, despite oral iron supplementation, we treated her with IV iron with Feraheme x 2 doses, 1 week apart.   Labs today showed improved hemoglobin of 10.7, MCV 86.8.  White count and platelet count are within normal limits.  Calcium 10.8, otherwise unremarkable CMP.  Iron studies show no evidence of iron deficiency today.  Ferritin pending.  She was advised to continue oral iron supplements for now.  If persistent iron deficiency is noted, we will consider repeating  another course of IV iron.   -Iron deficiency is presumably from GI blood loss.  Apparently FOBT was negative recently.  Patient cannot undergo colonoscopy because of her medical comorbidities.  Hence we will monitor this for now.  Hypercalcemia Since patient had intermittent hypercalcemia dating back to 2008, we obtained myeloma labs.  SPEP showed no evidence of M spike.  IFE showed polyclonal increase in 1 or more immunoglobulins.  Both IgG and IgA were mildly elevated.  Serum free kappa was slightly increased at 42 mg/L, lambda was 22 mg/L, ratio almost normal at 1.93.  Overall no evidence of monoclonal gammopathy.  Calcium remains high at 10.8 today, slightly better than last time.  PTH was checked today.  Results pending.    I spent a total of 25 minutes during this encounter with the patient including review of chart and various tests results, discussions about plan of care and coordination of care plan.  I reviewed lab results and outside records for this visit and discussed relevant results with the patient. Diagnosis, plan of care and treatment options were also discussed in detail with the patient. Opportunity provided to ask questions and answers provided to her apparent satisfaction. Provided instructions to call our clinic with any problems, questions or concerns prior to return visit. I recommended to continue follow-up with PCP and sub-specialists. She verbalized understanding and agreed with the plan. No barriers to learning was detected.  Wendy Crutch, MD  07/19/2023 4:44 PM  [facility] - A DEPT OF [place]. [facility] [address] [place]  [address] Dept: [phone] Dept Fax: [phone]   CHIEF COMPLAINT/ REASON FOR VISIT:  Follow-up for iron deficiency anemia, unspecified iron deficiency.  Also has chronic hypercalcemia of uncertain etiology dating back to 2008.  INTERVAL HISTORY:  Discussed the use of  AI scribe software for clinical note transcription with the patient, who gave verbal consent to proceed.   Patient returns for follow-up.  She was accompanied by her daughter who is very supportive.  She  presents for a follow-up visit after receiving two doses of IV iron three months ago. She reports feeling 'about the same' and does not perceive any improvement in her symptoms. She continues to take prescribed oral iron supplements daily. She also reports daily headaches, which are being managed by her neurologist. The patient has a longstanding history of slightly elevated calcium levels, dating back to 2008, which is not currently being treated.  SUMMARY OF HEMATOLOGIC HISTORY:  On 02/08/2023, labs at her PCPs office showed hemoglobin of 9.1, hematocrit of 31.4, MCV 80.7.  White count was 5800, platelet count 419,000.  Iron studies showed evidence of iron deficiency with iron saturation of 8%, iron decreased at 32.  Ferritin was borderline low at 17.7.  She was referred to [place] for further management of iron deficiency anemia, which was not resolving with oral iron supplementation.   The patient's hemoglobin levels have been fluctuating for several years, with the lowest recorded value being 8.6 in 2015. The patient's current hemoglobin level is around 10, which is slightly below the normal range. The patient denies any known blood loss and has not had any iron infusions or blood transfusions. The patient is currently taking iron supplements daily. The patient also reports experiencing headaches, which have been persistent. The patient has been on oxygen therapy for COPD for about three years and reports feeling lightheaded upon standing and shortness of breath when walking. The patient quit smoking over forty years ago.   Hemoglobin levels have been consistently low for several years, with the lowest recorded value being 8.6 in 2015. No known blood loss. Patient has been on oral iron supplements for  an extended period.   On her initial consultation with [place] on 04/19/2023, labs revealed persistent anemia with hemoglobin of 8.8, hematocrit 28.8, MCV 78.7.  White count and platelet count were within normal limits.  CMP showed creatinine of 1.01, calcium 10.9, total protein 8.1, albumin 4.3.  Iron studies showed evidence of iron deficiency with decreased iron saturation of 5%, decreased iron of 23.  Ferritin was decreased at 8.  Vitamin B12, folic acid, LDH were all normal.   Given persistent iron deficiency anemia, despite oral iron supplementation, we treated her with IV iron with Feraheme x 2 doses, 1 week apart.    -Iron deficiency is presumably from GI blood loss.  Apparently FOBT was negative recently.  Patient cannot undergo colonoscopy because of her medical comorbidities.  Hence we will monitor this for now.  Since patient had intermittent hypercalcemia, we obtained myeloma labs.  SPEP showed no evidence of M spike.  IFE showed polyclonal increase in 1 or more immunoglobulins.  Both IgG and IgA were mildly elevated.  Serum free kappa was slightly increased at 42 mg/L, lambda was 22 mg/L, ratio almost normal at 1.93.  Overall no evidence of monoclonal gammopathy.   Since the cause of anemia seems to be obvious from iron deficiency, I am not pursuing extensive workup at this time.  If inadequate response to IV iron is noted, we will  pursue workup to rule out other etiologies.  I have reviewed the past medical history, past surgical history, social history and family history with the patient and they are unchanged from previous note.  ALLERGIES: She is allergic to chlorthalidone and benazepril.  MEDICATIONS:  Current Outpatient Medications  Medication Sig Dispense Refill   acetaminophen (TYLENOL) 325 MG tablet Take 2 tablets (650 mg total) by mouth every 6 (six) hours as needed for mild pain (or Fever >/= 101). (Patient taking differently: Take 650 mg by mouth every 6 (six) hours as needed  for mild pain (pain score 1-3).) 40 tablet 0   albuterol (VENTOLIN HFA) 108 (90 Base) MCG/ACT inhaler Inhale 2 puffs into the lungs every 4 (four) hours as needed for wheezing or shortness of breath. 8.5 each 1   ALPRAZolam (XANAX) 0.25 MG tablet TAKE 1 TABLET BY MOUTH TWICE A DAY AS NEEDED 60 tablet 2   amLODipine (NORVASC) 10 MG tablet Take 1 tablet (10 mg total) by mouth daily. 90 tablet 3   aspirin EC 81 MG tablet Take 1 tablet (81 mg total) by mouth daily. Swallow whole.     Budeson-Glycopyrrol-Formoterol (BREZTRI AEROSPHERE) 160-9-4.8 MCG/ACT AERO Inhale 2 puffs into the lungs in the morning and at bedtime. 3 each 3   cloNIDine (CATAPRES) 0.2 MG tablet Take 1 tablet (0.2 mg total) by mouth 2 (two) times daily. 180 tablet 3   Fexofenadine HCl (MUCINEX ALLERGY PO) Take 1 tablet by mouth as needed (congestion).     furosemide (LASIX) 40 MG tablet Take 1 tablet (40 mg total) by mouth daily. 90 tablet 3   gabapentin (NEURONTIN) 600 MG tablet Take 1 tablet (600 mg total) by mouth 3 (three) times daily. 270 tablet 1   guaiFENesin (MUCINEX) 600 MG 12 hr tablet Take 1 tablet (600 mg total) by mouth 2 (two) times daily. 30 tablet 0   hydrALAZINE (APRESOLINE) 100 MG tablet Take 1 tablet (100 mg total) by mouth 3 (three) times daily. 270 tablet 3   iron polysaccharides (NIFEREX) 150 MG capsule TAKE 1 CAPSULE BY MOUTH EVERY DAY 90 capsule 1   lovastatin (MEVACOR) 40 MG tablet TAKE 1 TABLET BY MOUTH AT  BEDTIME 100 tablet 2   meclizine (ANTIVERT) 12.5 MG tablet TAKE 1 TABLET BY MOUTH THREE TIMES A DAY AS NEEDED FOR DIZZINESS 30 tablet 2   nystatin (MYCOSTATIN) 100000 UNIT/ML suspension Swish and spit 5mL two times daily after using Symbicort. (Patient taking differently: Use as directed 5 mLs in the mouth or throat in the morning and at bedtime. Swish and spit) 473 mL 5   pantoprazole (PROTONIX) 40 MG tablet TAKE 1 TABLET BY MOUTH DAILY 90 tablet 3   polyethylene glycol (MIRALAX / GLYCOLAX) 17 g packet Take  17 g by mouth daily as needed for mild constipation or moderate constipation.     solifenacin (VESICARE) 10 MG tablet TAKE 1 TABLET BY MOUTH EVERY DAY 90 tablet 1   tiZANidine (ZANAFLEX) 4 MG tablet TAKE 1 TABLET BY MOUTH EVERY 6 HOURS AS NEEDED FOR MUSCLE SPASMS. 50 tablet 1   traMADol (ULTRAM) 50 MG tablet TAKE 1 TABLET BY MOUTH EVERY 6 HOURS AS NEEDED FOR PAIN 120 tablet 2   traZODone (DESYREL) 50 MG tablet TAKE 1/2 TO 1 TABLET BY MOUTH AT BEDTIME AS NEEDED FOR SLEEP 90 tablet 1   triamcinolone cream (KENALOG) 0.1 % APPLY TO AFFECTED AREA TWICE A DAY (Patient taking differently: Apply 1 Application topically as needed (eczema).) 30 g 1  valsartan (DIOVAN) 160 MG tablet Take 1 tablet (160 mg total) by mouth daily. 90 tablet 3   fluocinolone (SYNALAR) 0.01 % external solution APPLY TOPICALLY 2 TIMES DAILY AS NEEDED. (Patient not taking: No sig reported) 60 mL 0   potassium chloride SA (KLOR-CON M20) 20 MEQ tablet Take 1 tablet (20 mEq total) by mouth daily. 90 tablet 3   No current facility-administered medications for this visit.     REVIEW OF SYSTEMS:    Review of Systems  Cardiovascular:  Positive for leg swelling.  Neurological:  Positive for dizziness and numbness.    All other pertinent systems were reviewed with the patient and are negative.  PHYSICAL EXAMINATION:   Onc Performance Status - 07/19/23 1520       ECOG Perf Status   ECOG Perf Status Capable of only limited selfcare, confined to bed or chair more than 50% of waking hours      KPS SCALE   KPS % SCORE Normal activity with effort, some s/s of disease             Vitals:   07/19/23 1505 07/19/23 1508  BP: (!) 151/61 (!) 157/60  Pulse: 66   Resp: 18   Temp: 97.7 F (36.5 C)   SpO2: 100%    Filed Weights   07/19/23 1505  Weight: 202 lb 4 oz (91.7 kg)    Physical Exam Constitutional:      General: She is not in acute distress.    Appearance: Normal appearance.     Comments: Presented to clinic in  a wheelchair today.  HENT:     Head: Normocephalic and atraumatic.  Eyes:     General: No scleral icterus.    Conjunctiva/sclera: Conjunctivae normal.  Cardiovascular:     Rate and Rhythm: Normal rate and regular rhythm.  Pulmonary:     Effort: Pulmonary effort is normal.     Comments: Wearing O2 via nasal cannula Abdominal:     General: There is no distension.  Musculoskeletal:     Right lower leg: Edema present.     Left lower leg: Edema present.  Neurological:     General: No focal deficit present.     Mental Status: She is alert and oriented to person, place, and time.  Psychiatric:        Mood and Affect: Mood normal.        Behavior: Behavior normal.     LABORATORY DATA:   I have reviewed the data as listed.  Results for orders placed or performed in visit on 07/19/23  Iron and Iron Binding Capacity ([facility] only)  Result Value Ref Range   Iron 48 28 - 170 ug/dL   TIBC 161 096 - 045 ug/dL   Saturation Ratios 15 10.4 - 31.8 %   UIBC 281 148 - 442 ug/dL  CMP (Cancer Center only)  Result Value Ref Range   Sodium 137 135 - 145 mmol/L   Potassium 4.0 3.5 - 5.1 mmol/L   Chloride 100 98 - 111 mmol/L   CO2 33 (H) 22 - 32 mmol/L   Glucose, Bld 112 (H) 70 - 99 mg/dL   BUN 17 8 - 23 mg/dL   Creatinine 4.09 8.11 - 1.00 mg/dL   Calcium 91.4 (H) 8.9 - 10.3 mg/dL   Total Protein 8.1 6.5 - 8.1 g/dL   Albumin 4.3 3.5 - 5.0 g/dL   AST 14 (L) 15 - 41 U/L   ALT 9 0 - 44 U/L  Alkaline Phosphatase 66 38 - 126 U/L   Total Bilirubin 0.2 0.0 - 1.2 mg/dL   GFR, Estimated 58 (L) >60 mL/min   Anion gap 4 (L) 5 - 15  CBC with Differential (Cancer Center Only)  Result Value Ref Range   WBC Count 6.8 4.0 - 10.5 K/uL   RBC 3.94 3.87 - 5.11 MIL/uL   Hemoglobin 10.7 (L) 12.0 - 15.0 g/dL   HCT 91.4 (L) 78.2 - 95.6 %   MCV 86.8 80.0 - 100.0 fL   MCH 27.2 26.0 - 34.0 pg   MCHC 31.3 30.0 - 36.0 g/dL   RDW 21.3 (H) 08.6 - 57.8 %   Platelet Count 288 150 - 400 K/uL   nRBC 0.0 0.0 -  0.2 %   Neutrophils Relative % 57 %   Neutro Abs 3.9 1.7 - 7.7 K/uL   Lymphocytes Relative 28 %   Lymphs Abs 1.9 0.7 - 4.0 K/uL   Monocytes Relative 12 %   Monocytes Absolute 0.8 0.1 - 1.0 K/uL   Eosinophils Relative 2 %   Eosinophils Absolute 0.1 0.0 - 0.5 K/uL   Basophils Relative 1 %   Basophils Absolute 0.1 0.0 - 0.1 K/uL   Immature Granulocytes 0 %   Abs Immature Granulocytes 0.00 0.00 - 0.07 K/uL    RADIOGRAPHIC STUDIES:  No recent pertinent imaging studies available to review.  Orders Placed This Encounter  Procedures   CBC with Differential (Cancer Center Only)    Standing Status:   Future    Expected Date:   10/19/2023    Expiration Date:   07/18/2024   CMP (Cancer Center only)    Standing Status:   Future    Expected Date:   10/19/2023    Expiration Date:   07/18/2024   Iron and Iron Binding Capacity ([facility] only)    Standing Status:   Future    Expected Date:   10/19/2023    Expiration Date:   07/18/2024   Ferritin    Standing Status:   Future    Expected Date:   10/19/2023    Expiration Date:   07/18/2024     Future Appointments  Date Time Provider Department Center  07/23/2023 11:40 AM Azalee Course, [place] [facility] None  08/03/2023  1:00 PM Shamleffer, Konrad Dolores, MD [facility] None  10/19/2023  2:15 PM [facility] None  10/19/2023  2:45 PM Pasqualina Colasurdo, MD [facility] None  05/27/2024  2:30 PM [facility]-ANNUAL WELLNESS VISIT [facility] None     This document was completed utilizing speech recognition software. Grammatical errors, random word insertions, pronoun errors, and incomplete sentences are an occasional consequence of this system due to software limitations, ambient noise, and hardware issues. Any formal questions or concerns about the content, text or information contained within the body of this dictation should be directly addressed to the provider for clarification.

## 2023-07-19 NOTE — Assessment & Plan Note (Signed)
 Since patient had intermittent hypercalcemia dating back to 2008, we obtained myeloma labs.  SPEP showed no evidence of M spike.  IFE showed polyclonal increase in 1 or more immunoglobulins.  Both IgG and IgA were mildly elevated.  Serum free kappa was slightly increased at 42 mg/L, lambda was 22 mg/L, ratio almost normal at 1.93.  Overall no evidence of monoclonal gammopathy.  Calcium remains high at 10.8 today, slightly better than last time.  PTH was checked today.  Results pending.

## 2023-07-20 ENCOUNTER — Telehealth: Payer: Self-pay | Admitting: Internal Medicine

## 2023-07-20 DIAGNOSIS — J4489 Other specified chronic obstructive pulmonary disease: Secondary | ICD-10-CM | POA: Diagnosis not present

## 2023-07-20 DIAGNOSIS — I110 Hypertensive heart disease with heart failure: Secondary | ICD-10-CM | POA: Diagnosis not present

## 2023-07-20 DIAGNOSIS — H9193 Unspecified hearing loss, bilateral: Secondary | ICD-10-CM | POA: Diagnosis not present

## 2023-07-20 DIAGNOSIS — K449 Diaphragmatic hernia without obstruction or gangrene: Secondary | ICD-10-CM | POA: Diagnosis not present

## 2023-07-20 DIAGNOSIS — D649 Anemia, unspecified: Secondary | ICD-10-CM | POA: Diagnosis not present

## 2023-07-20 DIAGNOSIS — M4802 Spinal stenosis, cervical region: Secondary | ICD-10-CM | POA: Diagnosis not present

## 2023-07-20 DIAGNOSIS — N3281 Overactive bladder: Secondary | ICD-10-CM | POA: Diagnosis not present

## 2023-07-20 DIAGNOSIS — J439 Emphysema, unspecified: Secondary | ICD-10-CM | POA: Diagnosis not present

## 2023-07-20 DIAGNOSIS — E785 Hyperlipidemia, unspecified: Secondary | ICD-10-CM | POA: Diagnosis not present

## 2023-07-20 DIAGNOSIS — M810 Age-related osteoporosis without current pathological fracture: Secondary | ICD-10-CM | POA: Diagnosis not present

## 2023-07-20 DIAGNOSIS — M5410 Radiculopathy, site unspecified: Secondary | ICD-10-CM | POA: Diagnosis not present

## 2023-07-20 DIAGNOSIS — R911 Solitary pulmonary nodule: Secondary | ICD-10-CM | POA: Diagnosis not present

## 2023-07-20 DIAGNOSIS — J9611 Chronic respiratory failure with hypoxia: Secondary | ICD-10-CM | POA: Diagnosis not present

## 2023-07-20 DIAGNOSIS — G4710 Hypersomnia, unspecified: Secondary | ICD-10-CM | POA: Diagnosis not present

## 2023-07-20 DIAGNOSIS — Q2733 Arteriovenous malformation of digestive system vessel: Secondary | ICD-10-CM | POA: Diagnosis not present

## 2023-07-20 DIAGNOSIS — M179 Osteoarthritis of knee, unspecified: Secondary | ICD-10-CM | POA: Diagnosis not present

## 2023-07-20 DIAGNOSIS — K219 Gastro-esophageal reflux disease without esophagitis: Secondary | ICD-10-CM | POA: Diagnosis not present

## 2023-07-20 DIAGNOSIS — I5032 Chronic diastolic (congestive) heart failure: Secondary | ICD-10-CM | POA: Diagnosis not present

## 2023-07-20 DIAGNOSIS — I272 Pulmonary hypertension, unspecified: Secondary | ICD-10-CM | POA: Diagnosis not present

## 2023-07-20 DIAGNOSIS — E559 Vitamin D deficiency, unspecified: Secondary | ICD-10-CM | POA: Diagnosis not present

## 2023-07-20 DIAGNOSIS — M5416 Radiculopathy, lumbar region: Secondary | ICD-10-CM | POA: Diagnosis not present

## 2023-07-20 LAB — FERRITIN: Ferritin: 86 ng/mL (ref 11–307)

## 2023-07-20 NOTE — Telephone Encounter (Signed)
 Ok to monitor for now, and f/u at next visit,   thanks

## 2023-07-20 NOTE — Telephone Encounter (Signed)
 Copied from CRM [id number]. Topic: Clinical - Medical Advice >> Jul 20, 2023 10:38 AM Elizebeth Brooking wrote: Reason for CRM: Marchelle Folks from [facility] called in stating patient has elevated blood pressure 152/60 and low heart rate of 57   Hasn't have any symptom changes

## 2023-07-21 ENCOUNTER — Other Ambulatory Visit: Payer: Self-pay | Admitting: Internal Medicine

## 2023-07-21 LAB — PTH, INTACT AND CALCIUM
Calcium, Total (PTH): 10.9 mg/dL — ABNORMAL HIGH (ref 8.7–10.3)
PTH: 47 pg/mL (ref 15–65)

## 2023-07-23 ENCOUNTER — Ambulatory Visit: Payer: Medicare Other | Attending: Physician Assistant | Admitting: Physician Assistant

## 2023-07-23 ENCOUNTER — Encounter: Payer: Self-pay | Admitting: Physician Assistant

## 2023-07-23 ENCOUNTER — Other Ambulatory Visit: Payer: Self-pay

## 2023-07-23 VITALS — BP 158/61 | HR 62 | Ht 64.0 in | Wt 203.0 lb

## 2023-07-23 DIAGNOSIS — J449 Chronic obstructive pulmonary disease, unspecified: Secondary | ICD-10-CM

## 2023-07-23 DIAGNOSIS — I10 Essential (primary) hypertension: Secondary | ICD-10-CM | POA: Diagnosis not present

## 2023-07-23 DIAGNOSIS — R600 Localized edema: Secondary | ICD-10-CM

## 2023-07-23 DIAGNOSIS — D649 Anemia, unspecified: Secondary | ICD-10-CM

## 2023-07-23 MED ORDER — VALSARTAN 320 MG PO TABS: 320.0000 mg | ORAL_TABLET | Freq: Every day | ORAL | 3 refills | Status: DC

## 2023-07-23 NOTE — Patient Instructions (Signed)
 Medication Instructions:  INCREASE VALSARTAN TO 320 MG DAILY *If you need a refill on your cardiac medications before your next appointment, please call your pharmacy*   Lab Work: NO LABS If you have labs (blood work) drawn today and your tests are completely normal, you will receive your results only by: MyChart Message (if you have MyChart) OR A paper copy in the mail If you have any lab test that is abnormal or we need to change your treatment, we will call you to review the results.   Testing/Procedures: NO TESTING   Follow-Up: At [facility], you and your health needs are our priority.  As part of our continuing mission to provide you with exceptional heart care, we have created designated Provider Care Teams.  These Care Teams include your primary Cardiologist (physician) and Advanced Practice Providers (APPs -  Physician Assistants and Nurse Practitioners) who all work together to provide you with the care you need, when you need it.  Your next appointment:   6 month(s)  Provider:   Thomasene Ripple, DO   Other Instructions You have been referred to HYP[facility]ENSION CLINIC with Dr. Duke Salvia.

## 2023-07-23 NOTE — Progress Notes (Unsigned)
 Cardiology Office Note:  .   Date:  07/24/2023  ID:  Wendy Torres, DOB [birth year], MRN [id number] PCP: Corwin Levins, MD  [facility] Providers Cardiologist:  Thomasene Ripple, DO     History of Present Illness: Wendy Torres   ALIVIYAH MALANGA is a 83 y.o. female with PMH of chronic respiratory failure on 24/7 home O2 4L/min, COPD, pulmonary hypertension, and chronic hypertension.  She has a history of heart failure.  She was recently seen by Dr. Servando Salina on 02/28/2023.  According to the note, she has fluctuating weight suggestive of fluid retention.  Her Lasix was increased to 40 mg daily.  Her losartan was switched to valsartan for better blood pressure control.  proBNP was 109.  Magnesium 1.9.  Renal function and electrolyte stable.  Echocardiogram obtained on 04/06/2023 showed EF 70 to 75%, no regional wall motion abnormality, moderate asymmetric LVH of the basal septal segment, grade 2 DD, moderately elevated PA systolic pressure, RVSP 59.6 mmHg, trivial MR.  Hemoglobin in December 2024 was 8.8.  Kappa free light chain was elevated at 42.4. SPEP showed no evidence of M spikes.  There was no evidence of monoclonal gammopathy.  Ferritin was low.  She was treated with IV iron therapy.  Her anemia is being followed by hematology/oncology service.  Patient presents today accompanied by daughter.  I reviewed with her the recent echocardiogram result.  She has trace amount of ankle edema that is worse by the end of the day.  I recommended conservative management.  Continue Lasix 40 mg daily.  Use compression stocking and leg elevation.  Otherwise, her lung is clear.  With her advanced age and frailty, I do not recommend aggressively diuresing her.  She can follow-up with cardiology service in 6 months.  ROS:   She denies chest pain, palpitations, dyspnea, pnd, orthopnea, n, v, dizziness, syncope, edema, weight gain, or early satiety. All other systems reviewed and are otherwise negative except as noted above.     Studies Reviewed: .        Cardiac Studies & Procedures   ______________________________________________________________________________________________     ECHOCARDIOGRAM  ECHOCARDIOGRAM COMPLETE 04/06/2023  Narrative ECHOCARDIOGRAM REPORT    Patient Name:   Wendy Torres Date of Exam: 04/06/2023 Medical Rec #:  [id number]      Height:       64.0 in Accession #:    [id number]     Weight:       205.4 lb Date of Birth:  Oct 23, 1940      BSA:          1.979 m Patient Age:    82 years       BP:           144/64 mmHg Patient Gender: F              HR:           74 bpm. Exam Location:  [address]  Procedure: 2D Echo, Cardiac Doppler and Color Doppler  Indications:    I50.32 Chronic Diastolic Heart Failure  History:        Patient has prior history of Echocardiogram examinations, most recent 01/04/2021. Stroke and COPD, Signs/Symptoms:Syncope and Dyspnea; Risk Factors:Hypertension and Dyslipidemia.  Sonographer:    Daphine Deutscher RDCS Referring Phys: [id number] KARDIE TOBB  IMPRESSIONS   1. Left ventricular ejection fraction, by estimation, is 70 to 75%. The left ventricle has hyperdynamic function. The left ventricle has no regional wall motion abnormalities. There is  moderate asymmetric left ventricular hypertrophy of the basal-septal segment. Left ventricular diastolic parameters are consistent with Grade II diastolic dysfunction (pseudonormalization). Elevated left atrial pressure. 2. Right ventricular systolic function is normal. The right ventricular size is normal. There is moderately elevated pulmonary artery systolic pressure. The estimated right ventricular systolic pressure is 59.6 mmHg. 3. The mitral valve is normal in structure. Trivial mitral valve regurgitation. No evidence of mitral stenosis. 4. The aortic valve was not well visualized. Aortic valve regurgitation is not visualized. Aortic valve sclerosis is present, with no evidence of aortic valve  stenosis. 5. The inferior vena cava is normal in size with greater than 50% respiratory variability, suggesting right atrial pressure of 3 mmHg.  FINDINGS Left Ventricle: Left ventricular ejection fraction, by estimation, is 70 to 75%. The left ventricle has hyperdynamic function. The left ventricle has no regional wall motion abnormalities. The left ventricular internal cavity size was normal in size. There is moderate asymmetric left ventricular hypertrophy of the basal-septal segment. Left ventricular diastolic parameters are consistent with Grade II diastolic dysfunction (pseudonormalization). Elevated left atrial pressure.  Right Ventricle: The right ventricular size is normal. No increase in right ventricular wall thickness. Right ventricular systolic function is normal. There is moderately elevated pulmonary artery systolic pressure. The tricuspid regurgitant velocity is 3.76 m/s, and with an assumed right atrial pressure of 3 mmHg, the estimated right ventricular systolic pressure is 59.6 mmHg.  Left Atrium: Left atrial size was normal in size.  Right Atrium: Right atrial size was normal in size.  Pericardium: There is no evidence of pericardial effusion.  Mitral Valve: The mitral valve is normal in structure. Trivial mitral valve regurgitation. No evidence of mitral valve stenosis.  Tricuspid Valve: The tricuspid valve is normal in structure. Tricuspid valve regurgitation is mild.  The aortic valve was not well visualized. Aortic valve regurgitation is not visualized. Aortic valve sclerosis is present, with no evidence of aortic valve stenosis. Pulmonic Valve: The pulmonic valve was not well visualized. Pulmonic valve regurgitation is trivial.  Aorta: The aortic root is normal in size and structure.  Venous: The inferior vena cava is normal in size with greater than 50% respiratory variability, suggesting right atrial pressure of 3 mmHg.  IAS/Shunts: The interatrial septum was not  well visualized.   LEFT VENTRICLE PLAX 2D LVIDd:         4.70 cm   Diastology LVIDs:         2.30 cm   LV e' medial:    6.04 cm/s LV PW:         1.00 cm   LV E/e' medial:  16.4 LV IVS:        1.00 cm   LV e' lateral:   8.98 cm/s LVOT diam:     2.20 cm   LV E/e' lateral: 11.0 LV SV:         122 LV SV Index:   62 LVOT Area:     3.80 cm   RIGHT VENTRICLE             IVC RV Basal diam:  4.20 cm     IVC diam: 1.40 cm RV S prime:     21.20 cm/s TAPSE (M-mode): 2.6 cm  LEFT ATRIUM             Index        RIGHT ATRIUM           Index LA diam:        5.20  cm 2.63 cm/m   RA Area:     14.50 cm LA Vol (A2C):   64.1 ml 32.40 ml/m  RA Volume:   34.20 ml  17.28 ml/m LA Vol (A4C):   49.7 ml 25.12 ml/m LA Biplane Vol: 56.4 ml 28.50 ml/m AORTIC VALVE AV Area (Vmax):    2.82 cm AV Area (Vmean):   2.78 cm AV Area (VTI):     2.82 cm AV Vmax:           192.20 cm/s AV Vmean:          132.800 cm/s AV VTI:            0.434 m AV Peak Grad:      14.8 mmHg AV Mean Grad:      8.0 mmHg LVOT Vmax:         142.50 cm/s LVOT Vmean:        97.000 cm/s LVOT VTI:          0.322 m LVOT/AV VTI ratio: 0.74  AORTA Ao Root diam: 3.20 cm  MITRAL VALVE                TRICUSPID VALVE MV Area (PHT): 3.45 cm     TR Peak grad:   56.6 mmHg MV Decel Time: 220 msec     TR Vmax:        376.00 cm/s MV E velocity: 99.00 cm/s MV A velocity: 113.00 cm/s  SHUNTS MV E/A ratio:  0.88         Systemic VTI:  0.32 m Systemic Diam: 2.20 cm  Epifanio Lesches MD Electronically signed by Epifanio Lesches MD Signature Date/Time: 04/06/2023/6:03:22 PM    Final          ______________________________________________________________________________________________      Risk Assessment/Calculations:            Physical Exam:   VS:  BP (!) 158/61   Pulse 62   Ht 5\' 4"  (1.626 m)   Wt 203 lb (92.1 kg)   SpO2 93%   BMI 34.84 kg/m    Wt Readings from Last 3 Encounters:  07/23/23 203 lb (92.1  kg)  07/19/23 202 lb 4 oz (91.7 kg)  05/24/23 201 lb 12.8 oz (91.5 kg)    GEN: Well nourished, well developed in no acute distress NECK: No JVD; No carotid bruits CARDIAC: RRR, no murmurs, rubs, gallops RESPIRATORY:  Clear to auscultation without rales, wheezing or rhonchi  ABDOMEN: Soft, non-tender, non-distended EXTREMITIES:  No edema; No deformity   ASSESSMENT AND PLAN: .     Pulmonary Hypertension secondary to COPD Chronic pulmonary hypertension due to COPD with RVSP at 59.6 mmHg, decreased from 66 mmHg in 2022. Right chamber pressure elevated due to increased pulmonary vascular resistance. Echocardiogram shows no new changes.  - Continue oxygen therapy at 4L/min.  Hypertension Chronic hypertension with current BP at 157/60 mmHg. On amlodipine, clonidine, hydralazine, and valsartan. - Increase valsartan to 320 mg daily. - Refer to hypertension clinic for further management and medication titration. - Advise on low salt diet and fluid intake between 32 to 64 ounces per day.  Peripheral Edema -Recent echocardiogram showed EF 70 to 75%. Trace ankle edema, worsens by end of day, likely due to venous insufficiency and sedentary lifestyle. Conservative management preferred to avoid dehydration risk from increased diuretics. - Continue Lasix at 40 mg daily. - Use compression stockings and elevate legs when possible.  Anemia Anemia under management by hematology/oncology. Received IV iron therapy. No  evidence of monoclonal gammopathy on SPEP. - Continue follow-up with hematology/oncology for anemia management.        Dispo: Follow-up with cardiology service in 6 months  Signed, Azalee Course, [place]

## 2023-07-25 DIAGNOSIS — M5410 Radiculopathy, site unspecified: Secondary | ICD-10-CM | POA: Diagnosis not present

## 2023-07-25 DIAGNOSIS — K219 Gastro-esophageal reflux disease without esophagitis: Secondary | ICD-10-CM | POA: Diagnosis not present

## 2023-07-25 DIAGNOSIS — M5416 Radiculopathy, lumbar region: Secondary | ICD-10-CM | POA: Diagnosis not present

## 2023-07-25 DIAGNOSIS — Q2733 Arteriovenous malformation of digestive system vessel: Secondary | ICD-10-CM | POA: Diagnosis not present

## 2023-07-25 DIAGNOSIS — J439 Emphysema, unspecified: Secondary | ICD-10-CM | POA: Diagnosis not present

## 2023-07-25 DIAGNOSIS — J4489 Other specified chronic obstructive pulmonary disease: Secondary | ICD-10-CM | POA: Diagnosis not present

## 2023-07-25 DIAGNOSIS — E559 Vitamin D deficiency, unspecified: Secondary | ICD-10-CM | POA: Diagnosis not present

## 2023-07-25 DIAGNOSIS — I272 Pulmonary hypertension, unspecified: Secondary | ICD-10-CM | POA: Diagnosis not present

## 2023-07-25 DIAGNOSIS — M179 Osteoarthritis of knee, unspecified: Secondary | ICD-10-CM | POA: Diagnosis not present

## 2023-07-25 DIAGNOSIS — N3281 Overactive bladder: Secondary | ICD-10-CM | POA: Diagnosis not present

## 2023-07-25 DIAGNOSIS — H9193 Unspecified hearing loss, bilateral: Secondary | ICD-10-CM | POA: Diagnosis not present

## 2023-07-25 DIAGNOSIS — E785 Hyperlipidemia, unspecified: Secondary | ICD-10-CM | POA: Diagnosis not present

## 2023-07-25 DIAGNOSIS — D649 Anemia, unspecified: Secondary | ICD-10-CM | POA: Diagnosis not present

## 2023-07-25 DIAGNOSIS — I5032 Chronic diastolic (congestive) heart failure: Secondary | ICD-10-CM | POA: Diagnosis not present

## 2023-07-25 DIAGNOSIS — M810 Age-related osteoporosis without current pathological fracture: Secondary | ICD-10-CM | POA: Diagnosis not present

## 2023-07-25 DIAGNOSIS — I110 Hypertensive heart disease with heart failure: Secondary | ICD-10-CM | POA: Diagnosis not present

## 2023-07-25 DIAGNOSIS — J9611 Chronic respiratory failure with hypoxia: Secondary | ICD-10-CM | POA: Diagnosis not present

## 2023-07-25 DIAGNOSIS — G4710 Hypersomnia, unspecified: Secondary | ICD-10-CM | POA: Diagnosis not present

## 2023-07-25 DIAGNOSIS — K449 Diaphragmatic hernia without obstruction or gangrene: Secondary | ICD-10-CM | POA: Diagnosis not present

## 2023-07-25 DIAGNOSIS — M4802 Spinal stenosis, cervical region: Secondary | ICD-10-CM | POA: Diagnosis not present

## 2023-07-25 DIAGNOSIS — R911 Solitary pulmonary nodule: Secondary | ICD-10-CM | POA: Diagnosis not present

## 2023-07-27 DIAGNOSIS — J439 Emphysema, unspecified: Secondary | ICD-10-CM | POA: Diagnosis not present

## 2023-07-27 DIAGNOSIS — J9611 Chronic respiratory failure with hypoxia: Secondary | ICD-10-CM | POA: Diagnosis not present

## 2023-07-27 DIAGNOSIS — K219 Gastro-esophageal reflux disease without esophagitis: Secondary | ICD-10-CM | POA: Diagnosis not present

## 2023-07-27 DIAGNOSIS — M179 Osteoarthritis of knee, unspecified: Secondary | ICD-10-CM | POA: Diagnosis not present

## 2023-07-27 DIAGNOSIS — D649 Anemia, unspecified: Secondary | ICD-10-CM | POA: Diagnosis not present

## 2023-07-27 DIAGNOSIS — M810 Age-related osteoporosis without current pathological fracture: Secondary | ICD-10-CM | POA: Diagnosis not present

## 2023-07-27 DIAGNOSIS — M4802 Spinal stenosis, cervical region: Secondary | ICD-10-CM | POA: Diagnosis not present

## 2023-07-27 DIAGNOSIS — G4710 Hypersomnia, unspecified: Secondary | ICD-10-CM | POA: Diagnosis not present

## 2023-07-27 DIAGNOSIS — N3281 Overactive bladder: Secondary | ICD-10-CM | POA: Diagnosis not present

## 2023-07-27 DIAGNOSIS — Q2733 Arteriovenous malformation of digestive system vessel: Secondary | ICD-10-CM | POA: Diagnosis not present

## 2023-07-27 DIAGNOSIS — H9193 Unspecified hearing loss, bilateral: Secondary | ICD-10-CM | POA: Diagnosis not present

## 2023-07-27 DIAGNOSIS — K449 Diaphragmatic hernia without obstruction or gangrene: Secondary | ICD-10-CM | POA: Diagnosis not present

## 2023-07-27 DIAGNOSIS — I272 Pulmonary hypertension, unspecified: Secondary | ICD-10-CM | POA: Diagnosis not present

## 2023-07-27 DIAGNOSIS — E785 Hyperlipidemia, unspecified: Secondary | ICD-10-CM | POA: Diagnosis not present

## 2023-07-27 DIAGNOSIS — M5410 Radiculopathy, site unspecified: Secondary | ICD-10-CM | POA: Diagnosis not present

## 2023-07-27 DIAGNOSIS — R911 Solitary pulmonary nodule: Secondary | ICD-10-CM | POA: Diagnosis not present

## 2023-07-27 DIAGNOSIS — M5416 Radiculopathy, lumbar region: Secondary | ICD-10-CM | POA: Diagnosis not present

## 2023-07-27 DIAGNOSIS — I110 Hypertensive heart disease with heart failure: Secondary | ICD-10-CM | POA: Diagnosis not present

## 2023-07-27 DIAGNOSIS — I5032 Chronic diastolic (congestive) heart failure: Secondary | ICD-10-CM | POA: Diagnosis not present

## 2023-07-27 DIAGNOSIS — E559 Vitamin D deficiency, unspecified: Secondary | ICD-10-CM | POA: Diagnosis not present

## 2023-07-27 DIAGNOSIS — J4489 Other specified chronic obstructive pulmonary disease: Secondary | ICD-10-CM | POA: Diagnosis not present

## 2023-07-30 ENCOUNTER — Telehealth: Payer: Self-pay

## 2023-07-30 NOTE — Telephone Encounter (Signed)
 [facility] order faxed

## 2023-07-31 DIAGNOSIS — M179 Osteoarthritis of knee, unspecified: Secondary | ICD-10-CM | POA: Diagnosis not present

## 2023-07-31 DIAGNOSIS — H9193 Unspecified hearing loss, bilateral: Secondary | ICD-10-CM | POA: Diagnosis not present

## 2023-07-31 DIAGNOSIS — N3281 Overactive bladder: Secondary | ICD-10-CM | POA: Diagnosis not present

## 2023-07-31 DIAGNOSIS — Q2733 Arteriovenous malformation of digestive system vessel: Secondary | ICD-10-CM | POA: Diagnosis not present

## 2023-07-31 DIAGNOSIS — G4710 Hypersomnia, unspecified: Secondary | ICD-10-CM | POA: Diagnosis not present

## 2023-07-31 DIAGNOSIS — E785 Hyperlipidemia, unspecified: Secondary | ICD-10-CM | POA: Diagnosis not present

## 2023-07-31 DIAGNOSIS — E559 Vitamin D deficiency, unspecified: Secondary | ICD-10-CM | POA: Diagnosis not present

## 2023-07-31 DIAGNOSIS — M5416 Radiculopathy, lumbar region: Secondary | ICD-10-CM | POA: Diagnosis not present

## 2023-07-31 DIAGNOSIS — J439 Emphysema, unspecified: Secondary | ICD-10-CM | POA: Diagnosis not present

## 2023-07-31 DIAGNOSIS — K449 Diaphragmatic hernia without obstruction or gangrene: Secondary | ICD-10-CM | POA: Diagnosis not present

## 2023-07-31 DIAGNOSIS — K219 Gastro-esophageal reflux disease without esophagitis: Secondary | ICD-10-CM | POA: Diagnosis not present

## 2023-07-31 DIAGNOSIS — M810 Age-related osteoporosis without current pathological fracture: Secondary | ICD-10-CM | POA: Diagnosis not present

## 2023-07-31 DIAGNOSIS — J9611 Chronic respiratory failure with hypoxia: Secondary | ICD-10-CM | POA: Diagnosis not present

## 2023-07-31 DIAGNOSIS — J4489 Other specified chronic obstructive pulmonary disease: Secondary | ICD-10-CM | POA: Diagnosis not present

## 2023-07-31 DIAGNOSIS — M4802 Spinal stenosis, cervical region: Secondary | ICD-10-CM | POA: Diagnosis not present

## 2023-07-31 DIAGNOSIS — D649 Anemia, unspecified: Secondary | ICD-10-CM | POA: Diagnosis not present

## 2023-07-31 DIAGNOSIS — I5032 Chronic diastolic (congestive) heart failure: Secondary | ICD-10-CM | POA: Diagnosis not present

## 2023-07-31 DIAGNOSIS — I272 Pulmonary hypertension, unspecified: Secondary | ICD-10-CM | POA: Diagnosis not present

## 2023-07-31 DIAGNOSIS — I110 Hypertensive heart disease with heart failure: Secondary | ICD-10-CM | POA: Diagnosis not present

## 2023-07-31 DIAGNOSIS — R911 Solitary pulmonary nodule: Secondary | ICD-10-CM | POA: Diagnosis not present

## 2023-07-31 DIAGNOSIS — M5410 Radiculopathy, site unspecified: Secondary | ICD-10-CM | POA: Diagnosis not present

## 2023-08-01 DIAGNOSIS — N3281 Overactive bladder: Secondary | ICD-10-CM | POA: Diagnosis not present

## 2023-08-01 DIAGNOSIS — M179 Osteoarthritis of knee, unspecified: Secondary | ICD-10-CM | POA: Diagnosis not present

## 2023-08-01 DIAGNOSIS — Q2733 Arteriovenous malformation of digestive system vessel: Secondary | ICD-10-CM | POA: Diagnosis not present

## 2023-08-01 DIAGNOSIS — K449 Diaphragmatic hernia without obstruction or gangrene: Secondary | ICD-10-CM | POA: Diagnosis not present

## 2023-08-01 DIAGNOSIS — I5032 Chronic diastolic (congestive) heart failure: Secondary | ICD-10-CM | POA: Diagnosis not present

## 2023-08-01 DIAGNOSIS — E785 Hyperlipidemia, unspecified: Secondary | ICD-10-CM | POA: Diagnosis not present

## 2023-08-01 DIAGNOSIS — E559 Vitamin D deficiency, unspecified: Secondary | ICD-10-CM | POA: Diagnosis not present

## 2023-08-01 DIAGNOSIS — K219 Gastro-esophageal reflux disease without esophagitis: Secondary | ICD-10-CM | POA: Diagnosis not present

## 2023-08-01 DIAGNOSIS — M4802 Spinal stenosis, cervical region: Secondary | ICD-10-CM | POA: Diagnosis not present

## 2023-08-01 DIAGNOSIS — I272 Pulmonary hypertension, unspecified: Secondary | ICD-10-CM | POA: Diagnosis not present

## 2023-08-01 DIAGNOSIS — I110 Hypertensive heart disease with heart failure: Secondary | ICD-10-CM | POA: Diagnosis not present

## 2023-08-01 DIAGNOSIS — M810 Age-related osteoporosis without current pathological fracture: Secondary | ICD-10-CM | POA: Diagnosis not present

## 2023-08-01 DIAGNOSIS — J9611 Chronic respiratory failure with hypoxia: Secondary | ICD-10-CM | POA: Diagnosis not present

## 2023-08-01 DIAGNOSIS — M5416 Radiculopathy, lumbar region: Secondary | ICD-10-CM | POA: Diagnosis not present

## 2023-08-01 DIAGNOSIS — J439 Emphysema, unspecified: Secondary | ICD-10-CM | POA: Diagnosis not present

## 2023-08-01 DIAGNOSIS — D649 Anemia, unspecified: Secondary | ICD-10-CM | POA: Diagnosis not present

## 2023-08-01 DIAGNOSIS — H9193 Unspecified hearing loss, bilateral: Secondary | ICD-10-CM | POA: Diagnosis not present

## 2023-08-01 DIAGNOSIS — M5410 Radiculopathy, site unspecified: Secondary | ICD-10-CM | POA: Diagnosis not present

## 2023-08-01 DIAGNOSIS — G4710 Hypersomnia, unspecified: Secondary | ICD-10-CM | POA: Diagnosis not present

## 2023-08-01 DIAGNOSIS — J4489 Other specified chronic obstructive pulmonary disease: Secondary | ICD-10-CM | POA: Diagnosis not present

## 2023-08-01 DIAGNOSIS — R911 Solitary pulmonary nodule: Secondary | ICD-10-CM | POA: Diagnosis not present

## 2023-08-02 ENCOUNTER — Telehealth: Payer: Self-pay | Admitting: Neurology

## 2023-08-02 NOTE — Telephone Encounter (Signed)
 Pt's daughter, Evaleigh Mccamy calling check if lab  order is still valid to bring patient today for lab work. Would like a call back

## 2023-08-02 NOTE — Telephone Encounter (Signed)
 Call to daughter advised lab order was still active and left voicemail with lab hours

## 2023-08-03 ENCOUNTER — Other Ambulatory Visit (INDEPENDENT_AMBULATORY_CARE_PROVIDER_SITE_OTHER): Payer: Self-pay

## 2023-08-03 ENCOUNTER — Ambulatory Visit: Payer: Medicare Other | Admitting: Internal Medicine

## 2023-08-03 DIAGNOSIS — I679 Cerebrovascular disease, unspecified: Secondary | ICD-10-CM

## 2023-08-03 DIAGNOSIS — M542 Cervicalgia: Secondary | ICD-10-CM

## 2023-08-03 DIAGNOSIS — R519 Headache, unspecified: Secondary | ICD-10-CM

## 2023-08-03 DIAGNOSIS — D509 Iron deficiency anemia, unspecified: Secondary | ICD-10-CM

## 2023-08-03 DIAGNOSIS — Z0289 Encounter for other administrative examinations: Secondary | ICD-10-CM

## 2023-08-04 ENCOUNTER — Encounter: Payer: Self-pay | Admitting: Neurology

## 2023-08-04 LAB — SEDIMENTATION RATE: Sed Rate: 45 mm/h — ABNORMAL HIGH (ref 0–40)

## 2023-08-04 LAB — C-REACTIVE PROTEIN: CRP: 4 mg/L (ref 0–10)

## 2023-08-07 ENCOUNTER — Ambulatory Visit: Payer: Self-pay

## 2023-08-07 DIAGNOSIS — E785 Hyperlipidemia, unspecified: Secondary | ICD-10-CM | POA: Diagnosis not present

## 2023-08-07 DIAGNOSIS — D649 Anemia, unspecified: Secondary | ICD-10-CM | POA: Diagnosis not present

## 2023-08-07 DIAGNOSIS — J439 Emphysema, unspecified: Secondary | ICD-10-CM | POA: Diagnosis not present

## 2023-08-07 DIAGNOSIS — M179 Osteoarthritis of knee, unspecified: Secondary | ICD-10-CM | POA: Diagnosis not present

## 2023-08-07 DIAGNOSIS — I110 Hypertensive heart disease with heart failure: Secondary | ICD-10-CM | POA: Diagnosis not present

## 2023-08-07 DIAGNOSIS — M810 Age-related osteoporosis without current pathological fracture: Secondary | ICD-10-CM | POA: Diagnosis not present

## 2023-08-07 DIAGNOSIS — N3281 Overactive bladder: Secondary | ICD-10-CM | POA: Diagnosis not present

## 2023-08-07 DIAGNOSIS — M4802 Spinal stenosis, cervical region: Secondary | ICD-10-CM | POA: Diagnosis not present

## 2023-08-07 DIAGNOSIS — K219 Gastro-esophageal reflux disease without esophagitis: Secondary | ICD-10-CM | POA: Diagnosis not present

## 2023-08-07 DIAGNOSIS — Q2733 Arteriovenous malformation of digestive system vessel: Secondary | ICD-10-CM | POA: Diagnosis not present

## 2023-08-07 DIAGNOSIS — I5032 Chronic diastolic (congestive) heart failure: Secondary | ICD-10-CM | POA: Diagnosis not present

## 2023-08-07 DIAGNOSIS — E559 Vitamin D deficiency, unspecified: Secondary | ICD-10-CM | POA: Diagnosis not present

## 2023-08-07 DIAGNOSIS — J9611 Chronic respiratory failure with hypoxia: Secondary | ICD-10-CM | POA: Diagnosis not present

## 2023-08-07 DIAGNOSIS — M5410 Radiculopathy, site unspecified: Secondary | ICD-10-CM | POA: Diagnosis not present

## 2023-08-07 DIAGNOSIS — R911 Solitary pulmonary nodule: Secondary | ICD-10-CM | POA: Diagnosis not present

## 2023-08-07 DIAGNOSIS — K449 Diaphragmatic hernia without obstruction or gangrene: Secondary | ICD-10-CM | POA: Diagnosis not present

## 2023-08-07 DIAGNOSIS — G4710 Hypersomnia, unspecified: Secondary | ICD-10-CM | POA: Diagnosis not present

## 2023-08-07 DIAGNOSIS — M5416 Radiculopathy, lumbar region: Secondary | ICD-10-CM | POA: Diagnosis not present

## 2023-08-07 DIAGNOSIS — I272 Pulmonary hypertension, unspecified: Secondary | ICD-10-CM | POA: Diagnosis not present

## 2023-08-07 DIAGNOSIS — H9193 Unspecified hearing loss, bilateral: Secondary | ICD-10-CM | POA: Diagnosis not present

## 2023-08-07 DIAGNOSIS — J4489 Other specified chronic obstructive pulmonary disease: Secondary | ICD-10-CM | POA: Diagnosis not present

## 2023-08-07 NOTE — Telephone Encounter (Signed)
 Chief Complaint: High BP  Symptoms: headache Frequency: ongoing  Pertinent Negatives: Patient denies chest pain Disposition: [] ED /[] Urgent Care (no appt availability in office) / [x] Appointment(In office/virtual)/ []  [facility]/ [] Home Care/ [] Refused Recommended Disposition /[] [facility]/ []  Follow-up with PCP Additional Notes: Amanda with [facility] (Physical Therapist) called in to report a high blood pressure reading for patient. Patient's BP was 171/70. Patient's family members have been recording BP daily and it has been riding high, averaging in the 150's systolic. Patient states she has been taking all prescribed medications daily without missing doses. Patient denies any new symptoms, but states she has ongoing headaches and left arm pain that her family states is chronic for her. Patient denies chest pain. Patient states she has exertional shortness of breath, and her family states this is not new for her either since patient is on oxygen. Appt made for further evaluation for Thursday to accompany family member's schedule who will bring patient to appt. Advised family members and patient to call 911 if patient has sudden chest pain, numbness or weakness of face, arms, or legs. Patient verbalized understanding.    Copied from CRM [id number]. Topic: Clinical - Red Word Triage >> Aug 07, 2023  4:41 PM Sim Boast F wrote: Red Word that prompted transfer to Nurse Triage: Marchelle Folks from [facility] is currently with patients and her blood pressure is 165/71 Reason for Disposition  [1] Systolic BP  >= 130 OR Diastolic >= 80 AND [2] taking BP medications  Answer Assessment - Initial Assessment Questions 1. BLOOD PRESSURE: "What is the blood pressure?" "Did you take at least two measurements 5 minutes apart?"     165/71, 171/70 2. ONSET: "When did you take your blood pressure?"     5 minutes 3. HOW: "How did you take your blood pressure?" (e.g., automatic  home BP monitor, visiting nurse)     Automatic cuff on upper arm 4. HISTORY: "Do you have a history of high blood pressure?"     Yes 5. MEDICINES: "Are you taking any medicines for blood pressure?" "Have you missed any doses recently?"     Clonidine, Hydralazine 6. OTHER SYMPTOMS: "Do you have any symptoms?" (e.g., blurred vision, chest pain, difficulty breathing, headache, weakness)     Headache, chronic arm pain  Protocols used: Blood Pressure - High-A-AH

## 2023-08-09 ENCOUNTER — Ambulatory Visit: Admitting: Internal Medicine

## 2023-08-16 ENCOUNTER — Telehealth: Payer: Self-pay | Admitting: Pulmonary Disease

## 2023-08-16 NOTE — Telephone Encounter (Signed)
 Patient's daughter came to drop off medical assistant forms for [employer]. Forms were copied and placed in a folder for Dr.Mannam to review and sign.

## 2023-08-16 NOTE — Telephone Encounter (Signed)
 I have received [employer] medical forms. They have been placed in PM's cabinet. Also routing to [place] to be aware so PM can sign on Monday.  Papers are in red folder.

## 2023-08-17 DIAGNOSIS — J9611 Chronic respiratory failure with hypoxia: Secondary | ICD-10-CM | POA: Diagnosis not present

## 2023-08-17 DIAGNOSIS — K449 Diaphragmatic hernia without obstruction or gangrene: Secondary | ICD-10-CM | POA: Diagnosis not present

## 2023-08-17 DIAGNOSIS — Q2733 Arteriovenous malformation of digestive system vessel: Secondary | ICD-10-CM | POA: Diagnosis not present

## 2023-08-17 DIAGNOSIS — D649 Anemia, unspecified: Secondary | ICD-10-CM | POA: Diagnosis not present

## 2023-08-17 DIAGNOSIS — I110 Hypertensive heart disease with heart failure: Secondary | ICD-10-CM | POA: Diagnosis not present

## 2023-08-17 DIAGNOSIS — J4489 Other specified chronic obstructive pulmonary disease: Secondary | ICD-10-CM | POA: Diagnosis not present

## 2023-08-17 DIAGNOSIS — J439 Emphysema, unspecified: Secondary | ICD-10-CM | POA: Diagnosis not present

## 2023-08-17 DIAGNOSIS — K219 Gastro-esophageal reflux disease without esophagitis: Secondary | ICD-10-CM | POA: Diagnosis not present

## 2023-08-17 DIAGNOSIS — M5410 Radiculopathy, site unspecified: Secondary | ICD-10-CM | POA: Diagnosis not present

## 2023-08-17 DIAGNOSIS — H9193 Unspecified hearing loss, bilateral: Secondary | ICD-10-CM | POA: Diagnosis not present

## 2023-08-17 DIAGNOSIS — I5032 Chronic diastolic (congestive) heart failure: Secondary | ICD-10-CM | POA: Diagnosis not present

## 2023-08-17 DIAGNOSIS — M5416 Radiculopathy, lumbar region: Secondary | ICD-10-CM | POA: Diagnosis not present

## 2023-08-17 DIAGNOSIS — M179 Osteoarthritis of knee, unspecified: Secondary | ICD-10-CM | POA: Diagnosis not present

## 2023-08-17 DIAGNOSIS — G4710 Hypersomnia, unspecified: Secondary | ICD-10-CM | POA: Diagnosis not present

## 2023-08-17 DIAGNOSIS — R911 Solitary pulmonary nodule: Secondary | ICD-10-CM | POA: Diagnosis not present

## 2023-08-17 DIAGNOSIS — I272 Pulmonary hypertension, unspecified: Secondary | ICD-10-CM | POA: Diagnosis not present

## 2023-08-17 DIAGNOSIS — E559 Vitamin D deficiency, unspecified: Secondary | ICD-10-CM | POA: Diagnosis not present

## 2023-08-17 DIAGNOSIS — M810 Age-related osteoporosis without current pathological fracture: Secondary | ICD-10-CM | POA: Diagnosis not present

## 2023-08-17 DIAGNOSIS — M4802 Spinal stenosis, cervical region: Secondary | ICD-10-CM | POA: Diagnosis not present

## 2023-08-17 DIAGNOSIS — N3281 Overactive bladder: Secondary | ICD-10-CM | POA: Diagnosis not present

## 2023-08-17 DIAGNOSIS — E785 Hyperlipidemia, unspecified: Secondary | ICD-10-CM | POA: Diagnosis not present

## 2023-08-20 ENCOUNTER — Telehealth: Payer: Self-pay

## 2023-08-20 MED ORDER — TRELEGY ELLIPTA 200-62.5-25 MCG/ACT IN AEPB: INHALATION_SPRAY | RESPIRATORY_TRACT | Status: DC

## 2023-08-20 NOTE — Telephone Encounter (Signed)
 left samples at front desk

## 2023-08-20 NOTE — Telephone Encounter (Signed)
 spoke w/ pt daughter okay per [employer] faxed [employer] medical form as well as sent request for switch back to [facility] [place] per Dr. Waylan Haggard

## 2023-08-20 NOTE — Telephone Encounter (Signed)
 I just got it today in my sign folder. It has been signed and sent bacl

## 2023-08-21 DIAGNOSIS — J9611 Chronic respiratory failure with hypoxia: Secondary | ICD-10-CM | POA: Diagnosis not present

## 2023-08-21 DIAGNOSIS — M4802 Spinal stenosis, cervical region: Secondary | ICD-10-CM | POA: Diagnosis not present

## 2023-08-21 DIAGNOSIS — N3281 Overactive bladder: Secondary | ICD-10-CM | POA: Diagnosis not present

## 2023-08-21 DIAGNOSIS — I110 Hypertensive heart disease with heart failure: Secondary | ICD-10-CM | POA: Diagnosis not present

## 2023-08-21 DIAGNOSIS — D649 Anemia, unspecified: Secondary | ICD-10-CM | POA: Diagnosis not present

## 2023-08-21 DIAGNOSIS — J4489 Other specified chronic obstructive pulmonary disease: Secondary | ICD-10-CM | POA: Diagnosis not present

## 2023-08-21 DIAGNOSIS — K449 Diaphragmatic hernia without obstruction or gangrene: Secondary | ICD-10-CM | POA: Diagnosis not present

## 2023-08-21 DIAGNOSIS — M5416 Radiculopathy, lumbar region: Secondary | ICD-10-CM | POA: Diagnosis not present

## 2023-08-21 DIAGNOSIS — I5032 Chronic diastolic (congestive) heart failure: Secondary | ICD-10-CM | POA: Diagnosis not present

## 2023-08-21 DIAGNOSIS — M179 Osteoarthritis of knee, unspecified: Secondary | ICD-10-CM | POA: Diagnosis not present

## 2023-08-21 DIAGNOSIS — E785 Hyperlipidemia, unspecified: Secondary | ICD-10-CM | POA: Diagnosis not present

## 2023-08-21 DIAGNOSIS — E559 Vitamin D deficiency, unspecified: Secondary | ICD-10-CM | POA: Diagnosis not present

## 2023-08-21 DIAGNOSIS — G4710 Hypersomnia, unspecified: Secondary | ICD-10-CM | POA: Diagnosis not present

## 2023-08-21 DIAGNOSIS — I272 Pulmonary hypertension, unspecified: Secondary | ICD-10-CM | POA: Diagnosis not present

## 2023-08-21 DIAGNOSIS — R911 Solitary pulmonary nodule: Secondary | ICD-10-CM | POA: Diagnosis not present

## 2023-08-21 DIAGNOSIS — M5410 Radiculopathy, site unspecified: Secondary | ICD-10-CM | POA: Diagnosis not present

## 2023-08-21 DIAGNOSIS — H9193 Unspecified hearing loss, bilateral: Secondary | ICD-10-CM | POA: Diagnosis not present

## 2023-08-21 DIAGNOSIS — Q2733 Arteriovenous malformation of digestive system vessel: Secondary | ICD-10-CM | POA: Diagnosis not present

## 2023-08-21 DIAGNOSIS — J439 Emphysema, unspecified: Secondary | ICD-10-CM | POA: Diagnosis not present

## 2023-08-21 DIAGNOSIS — M810 Age-related osteoporosis without current pathological fracture: Secondary | ICD-10-CM | POA: Diagnosis not present

## 2023-08-21 DIAGNOSIS — K219 Gastro-esophageal reflux disease without esophagitis: Secondary | ICD-10-CM | POA: Diagnosis not present

## 2023-08-22 NOTE — Telephone Encounter (Signed)
Closing encounter.  Nothing further needed.

## 2023-08-24 NOTE — Telephone Encounter (Signed)
 Wendy Torres, please advise if this forms were completed and faxed? Thanks

## 2023-08-24 NOTE — Telephone Encounter (Signed)
 Yes, per call on 4/21 spoke w/ daughter that they were faxed, she also came & got samples as well ( I made copy of fax report and the fax says successful [employer] has received the fax)

## 2023-08-30 DIAGNOSIS — I5032 Chronic diastolic (congestive) heart failure: Secondary | ICD-10-CM | POA: Diagnosis not present

## 2023-08-30 DIAGNOSIS — H9193 Unspecified hearing loss, bilateral: Secondary | ICD-10-CM | POA: Diagnosis not present

## 2023-08-30 DIAGNOSIS — M5416 Radiculopathy, lumbar region: Secondary | ICD-10-CM | POA: Diagnosis not present

## 2023-08-30 DIAGNOSIS — I110 Hypertensive heart disease with heart failure: Secondary | ICD-10-CM | POA: Diagnosis not present

## 2023-08-30 DIAGNOSIS — M4802 Spinal stenosis, cervical region: Secondary | ICD-10-CM | POA: Diagnosis not present

## 2023-08-30 DIAGNOSIS — I272 Pulmonary hypertension, unspecified: Secondary | ICD-10-CM | POA: Diagnosis not present

## 2023-08-30 DIAGNOSIS — M810 Age-related osteoporosis without current pathological fracture: Secondary | ICD-10-CM | POA: Diagnosis not present

## 2023-08-30 DIAGNOSIS — K449 Diaphragmatic hernia without obstruction or gangrene: Secondary | ICD-10-CM | POA: Diagnosis not present

## 2023-08-30 DIAGNOSIS — J9611 Chronic respiratory failure with hypoxia: Secondary | ICD-10-CM | POA: Diagnosis not present

## 2023-08-30 DIAGNOSIS — N3281 Overactive bladder: Secondary | ICD-10-CM | POA: Diagnosis not present

## 2023-08-30 DIAGNOSIS — K219 Gastro-esophageal reflux disease without esophagitis: Secondary | ICD-10-CM | POA: Diagnosis not present

## 2023-08-30 DIAGNOSIS — M5410 Radiculopathy, site unspecified: Secondary | ICD-10-CM | POA: Diagnosis not present

## 2023-08-30 DIAGNOSIS — J439 Emphysema, unspecified: Secondary | ICD-10-CM | POA: Diagnosis not present

## 2023-08-30 DIAGNOSIS — R911 Solitary pulmonary nodule: Secondary | ICD-10-CM | POA: Diagnosis not present

## 2023-08-30 DIAGNOSIS — E559 Vitamin D deficiency, unspecified: Secondary | ICD-10-CM | POA: Diagnosis not present

## 2023-08-30 DIAGNOSIS — J4489 Other specified chronic obstructive pulmonary disease: Secondary | ICD-10-CM | POA: Diagnosis not present

## 2023-08-30 DIAGNOSIS — Q2733 Arteriovenous malformation of digestive system vessel: Secondary | ICD-10-CM | POA: Diagnosis not present

## 2023-08-30 DIAGNOSIS — G4710 Hypersomnia, unspecified: Secondary | ICD-10-CM | POA: Diagnosis not present

## 2023-08-30 DIAGNOSIS — E785 Hyperlipidemia, unspecified: Secondary | ICD-10-CM | POA: Diagnosis not present

## 2023-08-30 DIAGNOSIS — M179 Osteoarthritis of knee, unspecified: Secondary | ICD-10-CM | POA: Diagnosis not present

## 2023-08-30 DIAGNOSIS — D649 Anemia, unspecified: Secondary | ICD-10-CM | POA: Diagnosis not present

## 2023-09-13 ENCOUNTER — Emergency Department (HOSPITAL_COMMUNITY)
Admission: EM | Admit: 2023-09-13 | Discharge: 2023-09-14 | Disposition: A | Discharge status: Home / Self Care | Attending: Emergency Medicine | Admitting: Emergency Medicine

## 2023-09-13 ENCOUNTER — Other Ambulatory Visit: Payer: Self-pay

## 2023-09-13 ENCOUNTER — Emergency Department (HOSPITAL_COMMUNITY)

## 2023-09-13 ENCOUNTER — Encounter (HOSPITAL_COMMUNITY): Payer: Self-pay

## 2023-09-13 DIAGNOSIS — Z79899 Other long term (current) drug therapy: Secondary | ICD-10-CM | POA: Diagnosis not present

## 2023-09-13 DIAGNOSIS — I509 Heart failure, unspecified: Secondary | ICD-10-CM | POA: Diagnosis not present

## 2023-09-13 DIAGNOSIS — J449 Chronic obstructive pulmonary disease, unspecified: Secondary | ICD-10-CM | POA: Insufficient documentation

## 2023-09-13 DIAGNOSIS — I110 Hypertensive heart disease with heart failure: Secondary | ICD-10-CM | POA: Insufficient documentation

## 2023-09-13 DIAGNOSIS — Z8673 Personal history of transient ischemic attack (TIA), and cerebral infarction without residual deficits: Secondary | ICD-10-CM | POA: Insufficient documentation

## 2023-09-13 DIAGNOSIS — R0789 Other chest pain: Secondary | ICD-10-CM | POA: Insufficient documentation

## 2023-09-13 DIAGNOSIS — Z7982 Long term (current) use of aspirin: Secondary | ICD-10-CM | POA: Diagnosis not present

## 2023-09-13 DIAGNOSIS — R6889 Other general symptoms and signs: Secondary | ICD-10-CM | POA: Diagnosis not present

## 2023-09-13 DIAGNOSIS — Z7951 Long term (current) use of inhaled steroids: Secondary | ICD-10-CM | POA: Diagnosis not present

## 2023-09-13 DIAGNOSIS — R079 Chest pain, unspecified: Secondary | ICD-10-CM | POA: Diagnosis not present

## 2023-09-13 DIAGNOSIS — R600 Localized edema: Secondary | ICD-10-CM | POA: Insufficient documentation

## 2023-09-13 LAB — CBC
HCT: 33.9 % — ABNORMAL LOW (ref 36.0–46.0)
Hemoglobin: 10.5 g/dL — ABNORMAL LOW (ref 12.0–15.0)
MCH: 27.7 pg (ref 26.0–34.0)
MCHC: 31 g/dL (ref 30.0–36.0)
MCV: 89.4 fL (ref 80.0–100.0)
Platelets: 301 10*3/uL (ref 150–400)
RBC: 3.79 MIL/uL — ABNORMAL LOW (ref 3.87–5.11)
RDW: 13.4 % (ref 11.5–15.5)
WBC: 7.1 10*3/uL (ref 4.0–10.5)
nRBC: 0 % (ref 0.0–0.2)

## 2023-09-13 LAB — PROTIME-INR
INR: 1 (ref 0.8–1.2)
Prothrombin Time: 13.5 s (ref 11.4–15.2)

## 2023-09-13 NOTE — ED Triage Notes (Signed)
 Patient BIB [facility] from home with complaint of chest pain starting this am that has progressively gotten worse though out the day.   Patient reports central chest pain that radiates to back, denies SOB, Nausea, Vomiting.  Patient on 4L [place] at home  22g L hand  324 ASA administered

## 2023-09-14 LAB — BRAIN NATRIURETIC PEPTIDE: B Natriuretic Peptide: 23.9 pg/mL (ref 0.0–100.0)

## 2023-09-14 LAB — BASIC METABOLIC PANEL WITH GFR
Anion gap: 7 (ref 5–15)
BUN: 15 mg/dL (ref 8–23)
CO2: 32 mmol/L (ref 22–32)
Calcium: 10.2 mg/dL (ref 8.9–10.3)
Chloride: 97 mmol/L — ABNORMAL LOW (ref 98–111)
Creatinine, Ser: 0.94 mg/dL (ref 0.44–1.00)
GFR, Estimated: 60 mL/min — ABNORMAL LOW (ref >60)
Glucose, Bld: 96 mg/dL (ref 70–99)
Potassium: 4.1 mmol/L (ref 3.5–5.1)
Sodium: 136 mmol/L (ref 135–145)

## 2023-09-14 LAB — TROPONIN I (HIGH SENSITIVITY)
Troponin I (High Sensitivity): 5 ng/L (ref <18)
Troponin I (High Sensitivity): 4 ng/L (ref <18)

## 2023-09-14 NOTE — Discharge Instructions (Signed)
 You were seen today for chest pain.  Your workup today is reassuring.  Follow-up closely with cardiology.

## 2023-09-14 NOTE — ED Notes (Signed)
 Unable to collect troponin after multiple attempts. Lab will attempt shortly

## 2023-09-14 NOTE — ED Provider Notes (Signed)
 [place] EMERGENCY DEPARTMENT AT [facility] Provider Note   CSN: [id number] Arrival date & time: 09/13/23  2244     History  Chief Complaint  Patient presents with   Chest Pain    Wendy Torres is a 83 y.o. female.  HPI     This an 83 year old female with a history of heart failure, chronic pain, hyperlipidemia who presents with chest pain.  Daughter is at the bedside and helps provide history.  Reports intermittent chest pain throughout the day.  She describes it as pressure.  It is not associated with exertion.  No recent fevers or cough.  Denies shortness of breath.  Is not having pain at this time.  Daughter states that they took her blood pressure and it was acutely elevated 190 systolic.  Reports some increased lower extremity swelling.  No recent medication changes.  Home Medications Prior to Admission medications   Medication Sig Start Date End Date Taking? Authorizing Provider  acetaminophen  (TYLENOL ) 325 MG tablet Take 2 tablets (650 mg total) by mouth every 6 (six) hours as needed for mild pain (or Fever >/= 101). Patient taking differently: Take 650 mg by mouth every 6 (six) hours as needed for mild pain (pain score 1-3). 02/19/14   Perkins, Alexzandrew L, PA-C  albuterol  (VENTOLIN  HFA) 108 (90 Base) MCG/ACT inhaler Inhale 2 puffs into the lungs every 4 (four) hours as needed for wheezing or shortness of breath. 05/01/23   Roslyn Coombe, MD  ALPRAZolam  (XANAX ) 0.25 MG tablet TAKE 1 TABLET BY MOUTH TWICE A DAY AS NEEDED 03/19/23   Roslyn Coombe, MD  amLODipine  (NORVASC ) 10 MG tablet Take 1 tablet (10 mg total) by mouth daily. 11/14/22   Roslyn Coombe, MD  aspirin  EC 81 MG tablet Take 1 tablet (81 mg total) by mouth daily. Swallow whole. 01/22/23   Kraig Peru, MD  Budeson-Glycopyrrol-Formoterol  (BREZTRI  AEROSPHERE) 160-9-4.8 MCG/ACT AERO Inhale 2 puffs into the lungs in the morning and at bedtime. 05/11/23 05/10/24  Mannam, Praveen, MD  cloNIDine  (CATAPRES )  0.2 MG tablet Take 1 tablet (0.2 mg total) by mouth 2 (two) times daily. 02/12/23   Roslyn Coombe, MD  Fexofenadine HCl (MUCINEX  ALLERGY PO) Take 1 tablet by mouth as needed (congestion).    [provider]  Fluticasone-Umeclidin-Vilant (TRELEGY ELLIPTA ) 200-62.5-25 MCG/ACT AEPB 2 samples 08/20/23   Mannam, Praveen, MD  furosemide  (LASIX ) 40 MG tablet Take 1 tablet (40 mg total) by mouth daily. 02/28/23 07/19/23  Tobb, Kardie, DO  gabapentin  (NEURONTIN ) 600 MG tablet TAKE 1 TABLET BY MOUTH THREE TIMES A DAY 07/23/23   Roslyn Coombe, MD  guaiFENesin  (MUCINEX ) 600 MG 12 hr tablet Take 1 tablet (600 mg total) by mouth 2 (two) times daily. 01/19/23   Kraig Peru, MD  hydrALAZINE  (APRESOLINE ) 100 MG tablet Take 1 tablet (100 mg total) by mouth 3 (three) times daily. 12/25/22   Roslyn Coombe, MD  iron  polysaccharides (NIFEREX) 150 MG capsule TAKE 1 CAPSULE BY MOUTH EVERY DAY 05/21/23   Roslyn Coombe, MD  lovastatin  (MEVACOR ) 40 MG tablet TAKE 1 TABLET BY MOUTH AT  BEDTIME 03/19/23   Roslyn Coombe, MD  meclizine  (ANTIVERT ) 12.5 MG tablet TAKE 1 TABLET BY MOUTH THREE TIMES A DAY AS NEEDED FOR DIZZINESS 06/04/23   Roslyn Coombe, MD  nystatin  (MYCOSTATIN ) 100000 UNIT/ML suspension Swish and spit 5mL two times daily after using Symbicort . Patient taking differently: Use as directed 5 mLs in the  mouth or throat in the morning and at bedtime. Swish and spit 11/09/22   Roslyn Coombe, MD  pantoprazole  (PROTONIX ) 40 MG tablet TAKE 1 TABLET BY MOUTH DAILY 03/06/23   Roslyn Coombe, MD  polyethylene glycol (MIRALAX  / GLYCOLAX ) 17 g packet Take 17 g by mouth daily as needed for mild constipation or moderate constipation.    [provider]  potassium chloride  SA (KLOR-CON  M20) 20 MEQ tablet Take 1 tablet (20 mEq total) by mouth daily. 02/28/23 05/29/23  Tobb, Kardie, DO  solifenacin  (VESICARE ) 10 MG tablet TAKE 1 TABLET BY MOUTH EVERY DAY 06/11/23   Roslyn Coombe, MD  tiZANidine  (ZANAFLEX ) 4 MG tablet TAKE 1  TABLET BY MOUTH EVERY 6 HOURS AS NEEDED FOR MUSCLE SPASMS. 06/25/23   Roslyn Coombe, MD  traMADol  (ULTRAM ) 50 MG tablet TAKE 1 TABLET BY MOUTH EVERY 6 HOURS AS NEEDED FOR PAIN 06/11/23   Roslyn Coombe, MD  traZODone  (DESYREL ) 50 MG tablet TAKE 1/2 TO 1 TABLET BY MOUTH AT BEDTIME AS NEEDED FOR SLEEP 07/02/23   Roslyn Coombe, MD  triamcinolone  cream (KENALOG ) 0.1 % APPLY TO AFFECTED AREA TWICE A DAY Patient taking differently: Apply 1 Application topically as needed (eczema). 07/10/22   Roslyn Coombe, MD  valsartan  (DIOVAN ) 320 MG tablet Take 1 tablet (320 mg total) by mouth daily. 07/23/23   Meng, Hao, PA      Allergies    Chlorthalidone and Benazepril     Review of Systems   Review of Systems  Constitutional:  Negative for fever.  Respiratory:  Negative for shortness of breath.   Cardiovascular:  Positive for chest pain and leg swelling.  Gastrointestinal:  Negative for abdominal pain, diarrhea and vomiting.  All other systems reviewed and are negative.   Physical Exam Updated Vital Signs BP (!) 162/86   Pulse 66   Temp 98.2 F (36.8 C) (Oral)   Resp 16   Ht 1.626 m (5\' 4" )   Wt 91.2 kg   SpO2 97%   BMI 34.50 kg/m  Physical Exam Vitals and nursing note reviewed.  Constitutional:      Appearance: She is well-developed. She is obese. She is not ill-appearing.  HENT:     Head: Normocephalic and atraumatic.  Eyes:     Pupils: Pupils are equal, round, and reactive to light.  Cardiovascular:     Rate and Rhythm: Normal rate and regular rhythm.     Heart sounds: Normal heart sounds.  Pulmonary:     Effort: Pulmonary effort is normal. No respiratory distress.     Breath sounds: No wheezing.     Comments: Nasal cannula in place Abdominal:     General: Bowel sounds are normal.     Palpations: Abdomen is soft.  Musculoskeletal:     Cervical back: Neck supple.     Right lower leg: Edema present.     Left lower leg: Edema present.     Comments: 1+ pitting edema bilaterally  Skin:     General: Skin is warm and dry.  Neurological:     Mental Status: She is alert and oriented to person, place, and time.  Psychiatric:        Mood and Affect: Mood normal.     ED Results / Procedures / Treatments   Labs (all labs ordered are listed, but only abnormal results are displayed) Labs Reviewed  BASIC METABOLIC PANEL WITH GFR - Abnormal; Notable for the following components:  Result Value   Chloride 97 (*)    GFR, Estimated 60 (*)    All other components within normal limits  CBC - Abnormal; Notable for the following components:   RBC 3.79 (*)    Hemoglobin 10.5 (*)    HCT 33.9 (*)    All other components within normal limits  PROTIME-INR  BRAIN NATRIURETIC PEPTIDE  TROPONIN I (HIGH SENSITIVITY)  TROPONIN I (HIGH SENSITIVITY)    EKG EKG Interpretation Date/Time:  Thursday Sep 13 2023 22:57:02 EDT Ventricular Rate:  61 PR Interval:  146 QRS Duration:  88 QT Interval:  404 QTC Calculation: 406 R Axis:   3  Text Interpretation: Normal sinus rhythm Nonspecific T wave abnormality Abnormal ECG When compared with ECG of 29-Jan-2023 17:44, PREVIOUS ECG IS PRESENT Confirmed by Donita Furrow [id number]) on 09/14/2023 12:16:20 AM  Radiology DG Chest 2 View Result Date: 09/13/2023 CLINICAL DATA:  Chest pain radiating to back EXAM: CHEST - 2 VIEW COMPARISON:  08/29/2022 FINDINGS: Frontal and lateral views of the chest demonstrates stable enlargement of the cardiac silhouette. Occasion atherosclerosis of the thoracic aorta again noted. No acute airspace disease, effusion, or pneumothorax. No acute bony abnormalities. IMPRESSION: 1. Stable chest, no acute process. Electronically Signed   By: Bobbye Burrow M.D.   On: 09/13/2023 23:42    Procedures Procedures    Medications Ordered in ED Medications - No data to display  ED Course/ Medical Decision Making/ A&P                                 Medical Decision Making Amount and/or Complexity of Data Reviewed Labs:  ordered. Radiology: ordered.   This patient presents to the ED for concern of chest pain, this involves an extensive number of treatment options, and is a complaint that carries with it a high risk of complications and morbidity.  I considered the following differential and admission for this acute, potentially life threatening condition.  The differential diagnosis includes ACS, PE, pneumothorax, pneumonia  MDM:    This is an 83 year old female who presents with chest pain.  Ongoing on and off for the last day.  Not exertional in nature.  She is nontoxic-appearing.  She is on her baseline home oxygen .  She has a little bit of lower extremity swelling which could represent some x-ray does not show any evidence of pneumothorax or pneumonia.  No significant pulmonary edema.  Troponin x 2 negative.  Low suspicion for PE.  Overall patient workup reassuring.  Updated the patient and her daughters.  Recommend follow-up with her cardiologist.  They are in agreement with plan.  (Labs, imaging, consults)  Labs: I Ordered, and personally interpreted labs.  The pertinent results include: CBC, BMP, troponin x 2  Imaging Studies ordered: I ordered imaging studies including chest x-ray I independently visualized and interpreted imaging. I agree with the radiologist interpretation  Additional history obtained from daughter at bedside.  External records from outside source obtained and reviewed including prior evaluations  Cardiac Monitoring: The patient was maintained on a cardiac monitor.  If on the cardiac monitor, I personally viewed and interpreted the cardiac monitored which showed an underlying rhythm of: Sinus  Reevaluation: After the interventions noted above, I reevaluated the patient and found that they have :improved  Social Determinants of Health:  lives with daughter  Disposition: Discharge  Co morbidities that complicate the patient evaluation  Past Medical History:  Diagnosis  Date    ALLERGIC RHINITIS 12/09/2006   ANEMIA 01/14/2009   Anxiety    Arthritis    ASTHMA 12/09/2006   Cataract    bilateral -removed   CHF (congestive heart failure) (HCC)    Chronic pain syndrome 02/16/2009   COPD 12/09/2006   DEGENERATIVE JOINT DISEASE 12/09/2006   DIZZINESS 03/30/2010   DYSPNEA 01/27/2010   with heavy exertion   FREQUENCY, URINARY 03/30/2010   GERD 12/09/2006   HELICOBACTER PYLORI GASTRITIS, HX OF 12/09/2006   HIATAL HERNIA 03/01/2010   HYPERLIPIDEMIA 04/18/2007   HYPERSOMNIA 08/31/2008   HYPERTENSION 12/09/2006   INSOMNIA-SLEEP DISORDER-UNSPEC 11/05/2008   LOW BACK PAIN 12/09/2006   Morbid obesity (HCC) 12/09/2006   Nonspecific (abnormal) findings on radiological and other examination of body structure 11/05/2008   OSTEOPOROSIS 12/09/2006   OVERACTIVE BLADDER 04/14/2008   PALPITATIONS, RECURRENT 01/27/2010   Stroke (HCC)    pt. stated light one years ago   SYNCOPE 11/05/2008   TRANSIENT ISCHEMIC ATTACK, HX OF 12/09/2006     Medicines No orders of the defined types were placed in this encounter.   I have reviewed the patients home medicines and have made adjustments as needed  Problem List / ED Course: Problem List Items Addressed This Visit   None Visit Diagnoses       Atypical chest pain    -  Primary                   Final Clinical Impression(s) / ED Diagnoses Final diagnoses:  Atypical chest pain    Rx / DC Orders ED Discharge Orders     None         Rory Collard, MD 09/14/23 [phone]

## 2023-09-15 ENCOUNTER — Other Ambulatory Visit: Payer: Self-pay | Admitting: Internal Medicine

## 2023-09-17 ENCOUNTER — Other Ambulatory Visit: Payer: Self-pay | Admitting: Internal Medicine

## 2023-09-20 DIAGNOSIS — E871 Hypo-osmolality and hyponatremia: Secondary | ICD-10-CM | POA: Diagnosis not present

## 2023-09-20 DIAGNOSIS — D631 Anemia in chronic kidney disease: Secondary | ICD-10-CM | POA: Diagnosis not present

## 2023-09-20 DIAGNOSIS — I129 Hypertensive chronic kidney disease with stage 1 through stage 4 chronic kidney disease, or unspecified chronic kidney disease: Secondary | ICD-10-CM | POA: Diagnosis not present

## 2023-09-20 DIAGNOSIS — N183 Chronic kidney disease, stage 3 unspecified: Secondary | ICD-10-CM | POA: Diagnosis not present

## 2023-09-27 ENCOUNTER — Encounter: Payer: Self-pay | Admitting: Internal Medicine

## 2023-09-27 ENCOUNTER — Ambulatory Visit: Payer: Self-pay

## 2023-09-27 NOTE — Telephone Encounter (Signed)
Reason for Disposition  [1] Localized rash is very painful AND [2] no fever  Protocols used: Rash or Redness - Localized-A-AH

## 2023-09-27 NOTE — Telephone Encounter (Signed)
 Chief Complaint: Rash, left eyelid/eyebrow Symptoms: pain Frequency: Began yesterday Pertinent Negatives: Patient denies fever,  Disposition: [] ED /[] Urgent Care (no appt availability in office) / [x] Appointment(In office/virtual)/ []  [place] [facility]/ [] Home Care/ [] Refused Recommended Disposition /[] [place] [facility]/ []  Follow-up with PCP Additional Notes: Pt's daughter/pt called in reporting pt began with itchy bumps to left eyelid and eyebrow yesterday, notes mild swelling to eyebrow and tenderness to touch. Denies vision changes, fever. Virtual OV scheduled tomorrow at pt's request, would like sooner appt if available today. This RN educated pt on home care, new-worsening symptoms, when to call back/seek emergent care. Pt verbalized understanding and agrees to plan.    Copied from CRM [id number]. Topic: Clinical - Red Word Triage >> Sep 27, 2023  8:20 AM Howard Macho wrote: Reason for EXB:[id number] daughter called stating the patient is having head pain and there is something going on with her eye. Patient daughter stated when she touch her eye it hurts where the bumps are forming. The patient daughter stated the patient stated it is a rash around her eye-small bumps around eyelid and eyebrow and having pain Answer Assessment - Initial Assessment Questions 1. APPEARANCE of RASH: "Describe the rash."      Bumps to eye lid, left 2. LOCATION: "Where is the rash located?"      Eyelid, eyebrow 5. ONSET: "When did the rash start?"      Began yesterday 6. ITCHING: "Does the rash itch?" If Yes, ask: "How bad is the itch?"  (Scale 0-10; or none, mild, moderate, severe)     Some itching 7. PAIN: "Does the rash hurt?" If Yes, ask: "How bad is the pain?"  (Scale 0-10; or none, mild, moderate, severe)    - NONE (0): no pain    - MILD (1-3): doesn't interfere with normal activities     - MODERATE (4-7): interferes with normal activities or awakens from sleep     - SEVERE (8-10): excruciating  pain, unable to do any normal activities     Tender to touch 8. OTHER SYMPTOMS: "Do you have any other symptoms?" (e.g., fever)     Swelling, HA  Protocols used: Rash or Redness - Localized-A-AH

## 2023-09-28 ENCOUNTER — Telehealth (INDEPENDENT_AMBULATORY_CARE_PROVIDER_SITE_OTHER): Admitting: Internal Medicine

## 2023-09-28 DIAGNOSIS — B029 Zoster without complications: Secondary | ICD-10-CM

## 2023-09-28 DIAGNOSIS — R7303 Prediabetes: Secondary | ICD-10-CM | POA: Diagnosis not present

## 2023-09-28 DIAGNOSIS — E559 Vitamin D deficiency, unspecified: Secondary | ICD-10-CM

## 2023-09-28 MED ORDER — VALACYCLOVIR HCL 1 G PO TABS: ORAL_TABLET | ORAL | 0 refills | Status: DC

## 2023-09-28 NOTE — Progress Notes (Signed)
 Patient ID: Wendy Torres, female   DOB: September 30, 1940, 83 y.o.   MRN: [id number]  Virtual Visit via Video Note  I connected with Wendy Torres on 09/29/23 at  3:00 PM EDT by a video enabled telemedicine application and verified that I am speaking with the correct person using two identifiers.  Location of all participants today Patient: at home with daughter present Provider: at office   I discussed the limitations of evaluation and management by telemedicine and the availability of in person appointments. The patient expressed understanding and agreed to proceed.  History of Present Illness: Pt here with acute onset 2 days left forehead and temple area painful rash with bumps, no prior hx of shingles.  Pt denies chest pain, increased sob or doe, wheezing, orthopnea, PND, increased LE swelling, palpitations, dizziness or syncope.   Pt denies polydipsia, polyuria, or new focal neuro s/s.    Past Medical History:  Diagnosis Date   ALLERGIC RHINITIS 12/09/2006   ANEMIA 01/14/2009   Anxiety    Arthritis    ASTHMA 12/09/2006   Cataract    bilateral -removed   CHF (congestive heart failure) (HCC)    Chronic pain syndrome 02/16/2009   COPD 12/09/2006   DEGENERATIVE JOINT DISEASE 12/09/2006   DIZZINESS 03/30/2010   DYSPNEA 01/27/2010   with heavy exertion   FREQUENCY, URINARY 03/30/2010   GERD 12/09/2006   HELICOBACTER PYLORI GASTRITIS, HX OF 12/09/2006   HIATAL HERNIA 03/01/2010   HYPERLIPIDEMIA 04/18/2007   HYPERSOMNIA 08/31/2008   HYPERTENSION 12/09/2006   INSOMNIA-SLEEP DISORDER-UNSPEC 11/05/2008   LOW BACK PAIN 12/09/2006   Morbid obesity (HCC) 12/09/2006   Nonspecific (abnormal) findings on radiological and other examination of body structure 11/05/2008   OSTEOPOROSIS 12/09/2006   OVERACTIVE BLADDER 04/14/2008   PALPITATIONS, RECURRENT 01/27/2010   Stroke (HCC)    pt. stated light one years ago   SYNCOPE 11/05/2008   TRANSIENT ISCHEMIC ATTACK, HX OF 12/09/2006   Past  Surgical History:  Procedure Laterality Date   BIOPSY  01/18/2023   Procedure: BIOPSY;  Surgeon: Asencion Blacksmith, MD;  Location: [facility];  Service: Gastroenterology;;   COLONOSCOPY     ESOPHAGOGASTRODUODENOSCOPY (EGD) WITH PROPOFOL  N/A 01/18/2023   Procedure: ESOPHAGOGASTRODUODENOSCOPY (EGD) WITH PROPOFOL ;  Surgeon: Asencion Blacksmith, MD;  Location: [facility];  Service: Gastroenterology;  Laterality: N/A;   HOT HEMOSTASIS N/A 01/18/2023   Procedure: HOT HEMOSTASIS (ARGON PLASMA COAGULATION/BICAP);  Surgeon: Asencion Blacksmith, MD;  Location: [facility];  Service: Gastroenterology;  Laterality: N/A;   LUMBAR DISC SURGERY     TONSILLECTOMY     as a child   TOTAL HIP ARTHROPLASTY Right 11/06/2012   Procedure: RIGHT TOTAL HIP ARTHROPLASTY WITH ACETABULAR AUTOGRAFT;  Surgeon: Aurther Blue, MD;  Location: [facility];  Service: Orthopedics;  Laterality: Right;   TOTAL HIP ARTHROPLASTY Left 02/18/2014   Procedure: LEFT TOTAL HIP ARTHROPLASTY;  Surgeon: Aurther Blue, MD;  Location: [facility];  Service: Orthopedics;  Laterality: Left;   TUBAL LIGATION      reports that she quit smoking about 50 years ago. Her smoking use included cigarettes. She started smoking about 75 years ago. She has a 12.5 pack-year smoking history. She has never used smokeless tobacco. She reports that she does not drink alcohol  and does not use drugs. family history includes Heart disease in her mother; Hypertension in her brother; Lung cancer in her father. Allergies  Allergen Reactions   Chlorthalidone Shortness Of Breath   Benazepril  Swelling   Current  Outpatient Medications on File Prior to Visit  Medication Sig Dispense Refill   acetaminophen  (TYLENOL ) 325 MG tablet Take 2 tablets (650 mg total) by mouth every 6 (six) hours as needed for mild pain (or Fever >/= 101). (Patient taking differently: Take 650 mg by mouth every 6 (six) hours as needed for mild pain (pain score 1-3).) 40 tablet 0   albuterol  (VENTOLIN  HFA)  108 (90 Base) MCG/ACT inhaler Inhale 2 puffs into the lungs every 4 (four) hours as needed for wheezing or shortness of breath. 8.5 each 1   ALPRAZolam  (XANAX ) 0.25 MG tablet TAKE 1 TABLET BY MOUTH TWICE A DAY AS NEEDED 60 tablet 2   amLODipine  (NORVASC ) 10 MG tablet TAKE 1 TABLET BY MOUTH EVERY DAY 90 tablet 3   aspirin  EC 81 MG tablet Take 1 tablet (81 mg total) by mouth daily. Swallow whole.     Budeson-Glycopyrrol-Formoterol  (BREZTRI  AEROSPHERE) 160-9-4.8 MCG/ACT AERO Inhale 2 puffs into the lungs in the morning and at bedtime. 3 each 3   cloNIDine  (CATAPRES ) 0.2 MG tablet Take 1 tablet (0.2 mg total) by mouth 2 (two) times daily. 180 tablet 3   Fexofenadine HCl (MUCINEX  ALLERGY PO) Take 1 tablet by mouth as needed (congestion).     Fluticasone-Umeclidin-Vilant (TRELEGY ELLIPTA ) 200-62.5-25 MCG/ACT AEPB 2 samples     furosemide  (LASIX ) 40 MG tablet Take 1 tablet (40 mg total) by mouth daily. 90 tablet 3   gabapentin  (NEURONTIN ) 600 MG tablet TAKE 1 TABLET BY MOUTH THREE TIMES A DAY 270 tablet 1   guaiFENesin  (MUCINEX ) 600 MG 12 hr tablet Take 1 tablet (600 mg total) by mouth 2 (two) times daily. 30 tablet 0   hydrALAZINE  (APRESOLINE ) 100 MG tablet Take 1 tablet (100 mg total) by mouth 3 (three) times daily. 270 tablet 3   iron  polysaccharides (NIFEREX) 150 MG capsule TAKE 1 CAPSULE BY MOUTH EVERY DAY 90 capsule 1   lovastatin  (MEVACOR ) 40 MG tablet TAKE 1 TABLET BY MOUTH AT  BEDTIME 100 tablet 2   meclizine  (ANTIVERT ) 12.5 MG tablet TAKE 1 TABLET BY MOUTH THREE TIMES A DAY AS NEEDED FOR DIZZINESS 30 tablet 2   nystatin  (MYCOSTATIN ) 100000 UNIT/ML suspension Swish and spit 5mL two times daily after using Symbicort . (Patient taking differently: Use as directed 5 mLs in the mouth or throat in the morning and at bedtime. Swish and spit) 473 mL 5   pantoprazole  (PROTONIX ) 40 MG tablet TAKE 1 TABLET BY MOUTH DAILY 90 tablet 3   polyethylene glycol (MIRALAX  / GLYCOLAX ) 17 g packet Take 17 g by mouth  daily as needed for mild constipation or moderate constipation.     potassium chloride  SA (KLOR-CON  M20) 20 MEQ tablet Take 1 tablet (20 mEq total) by mouth daily. 90 tablet 3   solifenacin  (VESICARE ) 10 MG tablet TAKE 1 TABLET BY MOUTH EVERY DAY 90 tablet 1   tiZANidine  (ZANAFLEX ) 4 MG tablet TAKE 1 TABLET BY MOUTH EVERY 6 HOURS AS NEEDED FOR MUSCLE SPASMS. 50 tablet 1   traMADol  (ULTRAM ) 50 MG tablet TAKE 1 TABLET BY MOUTH EVERY 6 HOURS AS NEEDED FOR PAIN 120 tablet 2   traZODone  (DESYREL ) 50 MG tablet TAKE 1/2 TO 1 TABLET BY MOUTH AT BEDTIME AS NEEDED FOR SLEEP 90 tablet 1   triamcinolone  cream (KENALOG ) 0.1 % APPLY TO AFFECTED AREA TWICE A DAY (Patient taking differently: Apply 1 Application topically as needed (eczema).) 30 g 1   valsartan  (DIOVAN ) 320 MG tablet Take 1 tablet (320 mg  total) by mouth daily. 90 tablet 3   No current facility-administered medications on file prior to visit.    Observations/Objective: Alert, NAD, appropriate mood and affect, resps normal, cn 2-12 intact, moves all 4s, has aprpox 2 cm area with multiple groups of grouped vesicles on erythem base but not involving the eyelids or eyes Lab Results  Component Value Date   WBC 7.1 09/13/2023   HGB 10.5 (L) 09/13/2023   HCT 33.9 (L) 09/13/2023   PLT 301 09/13/2023   GLUCOSE 96 09/13/2023   CHOL 167 11/09/2022   TRIG 73.0 11/09/2022   HDL 63.10 11/09/2022   LDLCALC 89 11/09/2022   ALT 9 07/19/2023   AST 14 (L) 07/19/2023   NA 136 09/13/2023   K 4.1 09/13/2023   CL 97 (L) 09/13/2023   CREATININE 0.94 09/13/2023   BUN 15 09/13/2023   CO2 32 09/13/2023   TSH 1.588 04/19/2023   INR 1.0 09/13/2023   HGBA1C 5.7 11/09/2022   MICROALBUR 0.7 11/09/2022   Assessment and Plan: See notes  Follow Up Instructions: See notes   I discussed the assessment and treatment plan with the patient. The patient was provided an opportunity to ask questions and all were answered. The patient agreed with the plan and  demonstrated an understanding of the instructions.   The patient was advised to call back or seek an in-person evaluation if the symptoms worsen or if the condition fails to improve as anticipated.   Rosalia Colonel, MD

## 2023-09-29 ENCOUNTER — Encounter: Payer: Self-pay | Admitting: Internal Medicine

## 2023-09-29 DIAGNOSIS — B029 Zoster without complications: Secondary | ICD-10-CM | POA: Insufficient documentation

## 2023-09-29 NOTE — Assessment & Plan Note (Signed)
Last vitamin D Lab Results  Component Value Date   VD25OH 21.95 (L) 02/01/2023   Low, to start oral replacement

## 2023-09-29 NOTE — Patient Instructions (Signed)
 Please take all new medication as prescribed

## 2023-09-29 NOTE — Assessment & Plan Note (Signed)
 New acute onset, for valtrex  1000 mg bid course, continue tramadol  for pain as needed,  to f/u any worsening symptoms or concerns

## 2023-09-29 NOTE — Assessment & Plan Note (Signed)
Lab Results  Component Value Date   HGBA1C 5.7 11/09/2022   Stable, pt to continue current medical treatment   - diet, wt control

## 2023-10-02 ENCOUNTER — Institutional Professional Consult (permissible substitution) (HOSPITAL_BASED_OUTPATIENT_CLINIC_OR_DEPARTMENT_OTHER): Admitting: Cardiovascular Disease

## 2023-10-02 ENCOUNTER — Encounter: Payer: Self-pay | Admitting: Internal Medicine

## 2023-10-02 ENCOUNTER — Ambulatory Visit: Admitting: Internal Medicine

## 2023-10-04 NOTE — Telephone Encounter (Signed)
 Error

## 2023-10-07 ENCOUNTER — Other Ambulatory Visit: Payer: Self-pay | Admitting: Internal Medicine

## 2023-10-08 ENCOUNTER — Other Ambulatory Visit: Payer: Self-pay

## 2023-10-08 ENCOUNTER — Encounter: Payer: Self-pay | Admitting: Pulmonary Disease

## 2023-10-08 MED ORDER — TRELEGY ELLIPTA 200-62.5-25 MCG/ACT IN AEPB: INHALATION_SPRAY | RESPIRATORY_TRACT | 3 refills | Status: DC

## 2023-10-11 ENCOUNTER — Other Ambulatory Visit: Payer: Self-pay | Admitting: Internal Medicine

## 2023-10-15 MED ORDER — TRELEGY ELLIPTA 200-62.5-25 MCG/ACT IN AEPB: 1.0000 | INHALATION_SPRAY | Freq: Every day | RESPIRATORY_TRACT | Status: DC

## 2023-10-15 NOTE — Telephone Encounter (Signed)
 Do you guys have trelegy samples at [facility]

## 2023-10-18 ENCOUNTER — Other Ambulatory Visit: Payer: Self-pay | Admitting: Internal Medicine

## 2023-10-18 ENCOUNTER — Other Ambulatory Visit: Payer: Self-pay | Admitting: Oncology

## 2023-10-18 ENCOUNTER — Other Ambulatory Visit: Payer: Self-pay

## 2023-10-18 DIAGNOSIS — D509 Iron deficiency anemia, unspecified: Secondary | ICD-10-CM

## 2023-10-19 ENCOUNTER — Telehealth: Payer: Self-pay | Admitting: Oncology

## 2023-10-19 ENCOUNTER — Other Ambulatory Visit

## 2023-10-19 ENCOUNTER — Ambulatory Visit: Admitting: Oncology

## 2023-10-19 NOTE — Telephone Encounter (Signed)
Called PT to reschedule appt.

## 2023-10-23 ENCOUNTER — Ambulatory Visit: Payer: Self-pay

## 2023-10-23 NOTE — Telephone Encounter (Signed)
 FYI Only or Action Required?: FYI only for provider.  Patient was last seen in primary care on 09/28/2023 by [name], MD. Called Nurse Triage reporting Herpes Zoster. Symptoms began several weeks ago. Interventions attempted: Prescription medications: Valtrex  and Rest, hydration, or home remedies. Symptoms are: forehead/temple improved-scabbed, new lesions in mouth.  Triage Disposition: See PCP When Office is Open (Within 3 Days), See HCP Within 4 Hours (Or PCP Triage)  Patient/caregiver understands and will follow disposition?: Yes, but will wait  Copied from CRM [id number]. Topic: Clinical - Red Word Triage >> Oct 23, 2023 11:48 AM Franky GRADE wrote: Red Word that prompted transfer to Nurse Triage: Patient daughter is calling because patient was diagnosed with singles and given antibiotics; however, her left side of the body hasn't gotten better and patient feels bumps inside her mouth. Reason for Disposition  [1] Shingles rash AND [2] spots start appearing other places on body  Unable to have a bowel movement (BM) without laxative or enema  Answer Assessment - Initial Assessment Questions 1. APPEARANCE of RASH: Describe the rash.      Shingles dx May 28 2. LOCATION: Where is the rash located?      Left side face 3. ONSET: When did the rash start?      Several days 4. ITCHING: Does the rash itch? If Yes, ask: How bad is the itch?  (Scale 1-10; or mild, moderate, severe)      5. PAIN: Does the rash hurt? If Yes, ask: How bad is the pain?  (Scale 0-10; or none, mild, moderate, severe)    - NONE (0): no pain    - MILD (1-3): doesn't interfere with normal activities     - MODERATE (4-7): interferes with normal activities or awakens from sleep     - SEVERE (8-10): excruciating pain, unable to do any normal activities     Moderate.  6. OTHER SYMPTOMS: Do you have any other symptoms? (e.g., fever)     Now in mouth, jaw painful. Denies difficulty breathing, swallowing, oral  swelling.   Additional info:  1) Finished valtrex  2) See constipation protocol.  3) Patient scheduled on requested date of 10/25/23 due to transportation.  Answer Assessment - Initial Assessment Questions 1. STOOL PATTERN OR FREQUENCY: How often do you have a bowel movement (BM)?  (Normal range: 3 times a day to every 3 days)  When was your last BM?       Last BM 4 days ago, had loose stool on Friday   2. MEDICINES: Have you been taking any new medicines? Are you taking any narcotic pain medicines? (e.g., Dilaudid , morphine , Percocet, Vicodin)     Antibiotic rx Valtrex   for shingles rash 3. LAXATIVES: Have you been using any stool softeners, laxatives, or enemas?  If Yes, ask What, how often, and when was the last time?     miralax   4. CAUSE: What do you think is causing the constipation?        unsure 5. OTHER SYMPTOMS: Do you have any other symptoms? (e.g., abdomen pain, bloating, fever, vomiting)       Abdominal cramping  Protocols used: Shingles (Zoster)-A-AH, Constipation-A-AH

## 2023-10-25 ENCOUNTER — Ambulatory Visit (INDEPENDENT_AMBULATORY_CARE_PROVIDER_SITE_OTHER): Admitting: Internal Medicine

## 2023-10-25 ENCOUNTER — Other Ambulatory Visit: Payer: Self-pay | Admitting: Internal Medicine

## 2023-10-25 ENCOUNTER — Encounter: Payer: Self-pay | Admitting: Internal Medicine

## 2023-10-25 VITALS — BP 140/68 | HR 50 | Temp 98.4°F | Ht 64.0 in | Wt 199.0 lb

## 2023-10-25 DIAGNOSIS — Z0001 Encounter for general adult medical examination with abnormal findings: Secondary | ICD-10-CM

## 2023-10-25 DIAGNOSIS — E785 Hyperlipidemia, unspecified: Secondary | ICD-10-CM

## 2023-10-25 DIAGNOSIS — R7303 Prediabetes: Secondary | ICD-10-CM

## 2023-10-25 DIAGNOSIS — E559 Vitamin D deficiency, unspecified: Secondary | ICD-10-CM | POA: Diagnosis not present

## 2023-10-25 DIAGNOSIS — Z0000 Encounter for general adult medical examination without abnormal findings: Secondary | ICD-10-CM

## 2023-10-25 DIAGNOSIS — B0229 Other postherpetic nervous system involvement: Secondary | ICD-10-CM | POA: Diagnosis not present

## 2023-10-25 DIAGNOSIS — I10 Essential (primary) hypertension: Secondary | ICD-10-CM | POA: Diagnosis not present

## 2023-10-25 MED ORDER — HYDROCODONE-ACETAMINOPHEN 5-325 MG PO TABS: 1.0000 | ORAL_TABLET | Freq: Four times a day (QID) | ORAL | 0 refills | Status: DC | PRN

## 2023-10-25 MED ORDER — PREDNISONE 10 MG PO TABS: ORAL_TABLET | ORAL | 0 refills | Status: DC

## 2023-10-25 NOTE — Patient Instructions (Addendum)
 Please take all new medication as prescribed - the hydrocodone  pain medication, as well as the prednisone   If you need further hydrocodone , let us  know you need the refill  Please continue all other medications as before, including gabapentin  600 mg three times per day.  Please have the pharmacy call with any other refills you may need.  Please continue your efforts at being more active, low cholesterol diet, and weight control.  You are otherwise up to date with prevention measures today.  Please keep your appointments with your specialists as you may have planned  We can hold on lab testing today  Please make an Appointment to return in 6 months, or sooner if needed

## 2023-10-25 NOTE — Progress Notes (Signed)
 Patient ID: Wendy Torres, female   DOB: 1941-04-27, 83 y.o.   MRN: [id number]         Chief Complaint:: wellness exam and Follow-up (Pt states that her mouth hurts and it in her head )  C/w PHN, chronic resp failure with hypoxia, low vit d, preDM, htn, hld       HPI:  Wendy Torres is a 83 y.o. female here for wellness exam; for shingrix at pharmacy in 90 days, for Tdap at pharmacy, o/w up to date                        Also with recent shingles outbreak to left face worse the forehead, but some discomfort near mouth as well, Pt denies chest pain, increased sob or doe, wheezing, orthopnea, PND, increased LE swelling, palpitations, dizziness or syncope.   Pt denies polydipsia, polyuria, or new focal neuro s/s.    Pt denies fever, wt loss, night sweats, loss of appetite, or other constitutional symptoms  Remains on home o2 4L.     Wt Readings from Last 3 Encounters:  10/25/23 199 lb (90.3 kg)  09/13/23 201 lb (91.2 kg)  07/23/23 203 lb (92.1 kg)   BP Readings from Last 3 Encounters:  10/25/23 (!) 140/68  09/14/23 (!) 166/65  07/23/23 (!) 158/61   Immunization History  Administered Date(s) Administered   Fluad Quad(high Dose 65+) 01/29/2021   Fluad Trivalent(High Dose 65+) 12/25/2022   Influenza Split 04/07/2011, 02/28/2012   Influenza Whole 04/15/2004, 04/18/2007, 01/27/2010   Influenza, High Dose Seasonal PF 02/04/2018, 12/10/2018   Influenza,inj,Quad PF,6+ Mos 03/16/2020   Influenza,inj,quad, With Preservative 01/29/2017   PFIZER(Purple Top)SARS-COV-2 Vaccination 09/14/2019, 10/12/2019   Pneumococcal Conjugate-13 12/03/2013   Pneumococcal Polysaccharide-23 05/02/2003, 09/29/2008, 12/10/2018   Tdap 04/07/2011   Health Maintenance Due  Topic Date Due   Zoster Vaccines- Shingrix (1 of 2) Never done   DTaP/Tdap/Td (2 - Td or Tdap) 04/06/2021      Past Medical History:  Diagnosis Date   ALLERGIC RHINITIS 12/09/2006   ANEMIA 01/14/2009   Anxiety    Arthritis    ASTHMA  12/09/2006   Cataract    bilateral -removed   CHF (congestive heart failure) (HCC)    Chronic pain syndrome 02/16/2009   COPD 12/09/2006   DEGENERATIVE JOINT DISEASE 12/09/2006   DIZZINESS 03/30/2010   DYSPNEA 01/27/2010   with heavy exertion   FREQUENCY, URINARY 03/30/2010   GERD 12/09/2006   HELICOBACTER PYLORI GASTRITIS, HX OF 12/09/2006   HIATAL HERNIA 03/01/2010   HYPERLIPIDEMIA 04/18/2007   HYPERSOMNIA 08/31/2008   HYPERTENSION 12/09/2006   INSOMNIA-SLEEP DISORDER-UNSPEC 11/05/2008   LOW BACK PAIN 12/09/2006   Morbid obesity (HCC) 12/09/2006   Nonspecific (abnormal) findings on radiological and other examination of body structure 11/05/2008   OSTEOPOROSIS 12/09/2006   OVERACTIVE BLADDER 04/14/2008   PALPITATIONS, RECURRENT 01/27/2010   Stroke (HCC)    pt. stated light one years ago   SYNCOPE 11/05/2008   TRANSIENT ISCHEMIC ATTACK, HX OF 12/09/2006   Past Surgical History:  Procedure Laterality Date   BIOPSY  01/18/2023   Procedure: BIOPSY;  Surgeon: Aneita Gwendlyn DASEN, MD;  Location: [facility];  Service: Gastroenterology;;   COLONOSCOPY     ESOPHAGOGASTRODUODENOSCOPY (EGD) WITH PROPOFOL  N/A 01/18/2023   Procedure: ESOPHAGOGASTRODUODENOSCOPY (EGD) WITH PROPOFOL ;  Surgeon: Aneita Gwendlyn DASEN, MD;  Location: [facility];  Service: Gastroenterology;  Laterality: N/A;   HOT HEMOSTASIS N/A 01/18/2023   Procedure: HOT HEMOSTASIS (ARGON  PLASMA COAGULATION/BICAP);  Surgeon: Aneita Gwendlyn DASEN, MD;  Location: [facility];  Service: Gastroenterology;  Laterality: N/A;   LUMBAR DISC SURGERY     TONSILLECTOMY     as a child   TOTAL HIP ARTHROPLASTY Right 11/06/2012   Procedure: RIGHT TOTAL HIP ARTHROPLASTY WITH ACETABULAR AUTOGRAFT;  Surgeon: Dempsey LULLA Moan, MD;  Location: [facility];  Service: Orthopedics;  Laterality: Right;   TOTAL HIP ARTHROPLASTY Left 02/18/2014   Procedure: LEFT TOTAL HIP ARTHROPLASTY;  Surgeon: Dempsey Moan LULLA, MD;  Location: [facility];  Service: Orthopedics;   Laterality: Left;   TUBAL LIGATION      reports that she quit smoking about 51 years ago. Her smoking use included cigarettes. She started smoking about 76 years ago. She has a 12.5 pack-year smoking history. She has never used smokeless tobacco. She reports that she does not drink alcohol  and does not use drugs. family history includes Heart disease in her mother; Hypertension in her brother; Lung cancer in her father. Allergies  Allergen Reactions   Chlorthalidone Shortness Of Breath   Benazepril  Swelling   Current Outpatient Medications on File Prior to Visit  Medication Sig Dispense Refill   acetaminophen  (TYLENOL ) 325 MG tablet Take 2 tablets (650 mg total) by mouth every 6 (six) hours as needed for mild pain (or Fever >/= 101). (Patient taking differently: Take 650 mg by mouth every 6 (six) hours as needed for mild pain (pain score 1-3).) 40 tablet 0   albuterol  (VENTOLIN  HFA) 108 (90 Base) MCG/ACT inhaler Inhale 2 puffs into the lungs every 4 (four) hours as needed for wheezing or shortness of breath. 8.5 each 1   ALPRAZolam  (XANAX ) 0.25 MG tablet TAKE 1 TABLET BY MOUTH TWICE A DAY AS NEEDED 60 tablet 2   amLODipine  (NORVASC ) 10 MG tablet TAKE 1 TABLET BY MOUTH EVERY DAY 90 tablet 3   aspirin  EC 81 MG tablet Take 1 tablet (81 mg total) by mouth daily. Swallow whole.     Budeson-Glycopyrrol-Formoterol  (BREZTRI  AEROSPHERE) 160-9-4.8 MCG/ACT AERO Inhale 2 puffs into the lungs in the morning and at bedtime. 3 each 3   Fexofenadine HCl (MUCINEX  ALLERGY PO) Take 1 tablet by mouth as needed (congestion).     Fluticasone-Umeclidin-Vilant (TRELEGY ELLIPTA ) 200-62.5-25 MCG/ACT AEPB 2 samples 3 each 3   Fluticasone-Umeclidin-Vilant (TRELEGY ELLIPTA ) 200-62.5-25 MCG/ACT AEPB Inhale 1 Inhalation into the lungs daily.     furosemide  (LASIX ) 40 MG tablet Take 1 tablet (40 mg total) by mouth daily. 90 tablet 3   gabapentin  (NEURONTIN ) 600 MG tablet TAKE 1 TABLET BY MOUTH THREE TIMES A DAY 270 tablet 1    guaiFENesin  (MUCINEX ) 600 MG 12 hr tablet Take 1 tablet (600 mg total) by mouth 2 (two) times daily. 30 tablet 0   hydrALAZINE  (APRESOLINE ) 100 MG tablet Take 1 tablet (100 mg total) by mouth 3 (three) times daily. 270 tablet 3   iron  polysaccharides (NIFEREX) 150 MG capsule TAKE 1 CAPSULE BY MOUTH EVERY DAY 90 capsule 1   lovastatin  (MEVACOR ) 40 MG tablet TAKE 1 TABLET BY MOUTH AT  BEDTIME 100 tablet 2   meclizine  (ANTIVERT ) 12.5 MG tablet TAKE 1 TABLET BY MOUTH THREE TIMES A DAY AS NEEDED FOR DIZZINESS 30 tablet 2   nystatin  (MYCOSTATIN ) 100000 UNIT/ML suspension Swish and spit 5mL two times daily after using Symbicort . (Patient taking differently: Use as directed 5 mLs in the mouth or throat in the morning and at bedtime. Swish and spit) 473 mL 5   pantoprazole  (PROTONIX )  40 MG tablet TAKE 1 TABLET BY MOUTH DAILY 90 tablet 3   polyethylene glycol (MIRALAX  / GLYCOLAX ) 17 g packet Take 17 g by mouth daily as needed for mild constipation or moderate constipation.     potassium chloride  SA (KLOR-CON  M20) 20 MEQ tablet Take 1 tablet (20 mEq total) by mouth daily. 90 tablet 3   solifenacin  (VESICARE ) 10 MG tablet TAKE 1 TABLET BY MOUTH EVERY DAY 90 tablet 1   tiZANidine  (ZANAFLEX ) 4 MG tablet TAKE 1 TABLET BY MOUTH EVERY 6 HOURS AS NEEDED FOR MUSCLE SPASMS. 50 tablet 1   traMADol  (ULTRAM ) 50 MG tablet TAKE 1 TABLET BY MOUTH EVERY 6 HOURS AS NEEDED FOR PAIN 120 tablet 2   traZODone  (DESYREL ) 50 MG tablet TAKE 1/2 TO 1 TABLET BY MOUTH AT BEDTIME AS NEEDED FOR SLEEP 90 tablet 1   triamcinolone  cream (KENALOG ) 0.1 % APPLY TO AFFECTED AREA TWICE A DAY 30 g 1   valACYclovir  (VALTREX ) 1000 MG tablet Take 1 tab by mouth three times per day 21 tablet 0   valsartan  (DIOVAN ) 320 MG tablet Take 1 tablet (320 mg total) by mouth daily. 90 tablet 3   No current facility-administered medications on file prior to visit.        ROS:  All others reviewed and negative.  Objective        PE:  BP (!) 140/68 (BP  Location: Left Arm, Patient Position: Sitting, Cuff Size: Normal)   Pulse (!) 50   Temp 98.4 F (36.9 C) (Oral)   Ht 5' 4 (1.626 m)   Wt 199 lb (90.3 kg)   SpO2 96%   BMI 34.16 kg/m                 Constitutional: Pt appears in NAD               HENT: Head: NCAT.                Right Ear: External ear normal.                 Left Ear: External ear normal.                Eyes: . Pupils are equal, round, and reactive to light. Conjunctivae and EOM are normal               Nose: without d/c or deformity               Neck: Neck supple. Gross normal ROM               Cardiovascular: Normal rate and regular rhythm.                 Pulmonary/Chest: Effort normal and breath sounds without rales or wheezing.                Abd:  Soft, NT, ND, + BS, no organomegaly               Neurological: Pt is alert. At baseline orientation, motor grossly intact               Skin: Skin is warm. No rashes but has scabbed over shingles lesions to left forhead, no other new lesions, LE edema - none               Psychiatric: Pt behavior is normal without agitation   Micro: none  Cardiac tracings I have personally interpreted today:  none  Pertinent Radiological findings (summarize): none   Lab Results  Component Value Date   WBC 7.1 09/13/2023   HGB 10.5 (L) 09/13/2023   HCT 33.9 (L) 09/13/2023   PLT 301 09/13/2023   GLUCOSE 96 09/13/2023   CHOL 167 11/09/2022   TRIG 73.0 11/09/2022   HDL 63.10 11/09/2022   LDLCALC 89 11/09/2022   ALT 9 07/19/2023   AST 14 (L) 07/19/2023   NA 136 09/13/2023   K 4.1 09/13/2023   CL 97 (L) 09/13/2023   CREATININE 0.94 09/13/2023   BUN 15 09/13/2023   CO2 32 09/13/2023   TSH 1.588 04/19/2023   INR 1.0 09/13/2023   HGBA1C 5.7 11/09/2022   Assessment/Plan:  Wendy Torres is a 83 y.o. Black or African American [2] female with  has a past medical history of ALLERGIC RHINITIS (12/09/2006), ANEMIA (01/14/2009), Anxiety, Arthritis, ASTHMA (12/09/2006),  Cataract, CHF (congestive heart failure) (HCC), Chronic pain syndrome (02/16/2009), COPD (12/09/2006), DEGENERATIVE JOINT DISEASE (12/09/2006), DIZZINESS (03/30/2010), DYSPNEA (01/27/2010), FREQUENCY, URINARY (03/30/2010), GERD (12/09/2006), HELICOBACTER PYLORI GASTRITIS, HX OF (12/09/2006), HIATAL HERNIA (03/01/2010), HYPERLIPIDEMIA (04/18/2007), HYPERSOMNIA (08/31/2008), HYPERTENSION (12/09/2006), INSOMNIA-SLEEP DISORDER-UNSPEC (11/05/2008), LOW BACK PAIN (12/09/2006), Morbid obesity (HCC) (12/09/2006), Nonspecific (abnormal) findings on radiological and other examination of body structure (11/05/2008), OSTEOPOROSIS (12/09/2006), OVERACTIVE BLADDER (04/14/2008), PALPITATIONS, RECURRENT (01/27/2010), Stroke (HCC), SYNCOPE (11/05/2008), and TRANSIENT ISCHEMIC ATTACK, HX OF (12/09/2006).  Encounter for well adult exam with abnormal findings Age and sex appropriate education and counseling updated with regular exercise and diet Referrals for preventative services - none needed Immunizations addressed - for tdap and shingrix at pharmacy Smoking counseling  - none needed Evidence for depression or other mood disorder - none significant Most recent labs reviewed. I have personally reviewed and have noted: 1) the patient's medical and social history 2) The patient's current medications and supplements 3) The patient's height, weight, and BMI have been recorded in the chart   Vitamin D  deficiency Last vitamin D  Lab Results  Component Value Date   VD25OH 21.95 (L) 02/01/2023   Low, to start oral replacement   Prediabetes Lab Results  Component Value Date   HGBA1C 5.7 11/09/2022   Stable, pt to continue current medical treatment  - diet, wt control   PHN (postherpetic neuralgia) Mod to severe, to continue gabapentin  600 tid, prednisone  taper, hydrocodone  5 325 prn  Essential hypertension BP Readings from Last 3 Encounters:  10/25/23 (!) 140/68  09/14/23 (!) 166/65  07/23/23 (!) 158/61    unocntrolled pt to continue medical treatment norvasc  10 every day, catapres  0.2 bid, hydralazine  100 tid, diovan  320 every day - declines other change today   Dyslipidemia, goal LDL below 130 Lab Results  Component Value Date   LDLCALC 89 11/09/2022   Stable, pt to continue current statin lovastatin  40 qhs  Followup: Return in about 6 months (around 04/25/2024).  Lynwood Rush, MD 10/28/2023 2:16 PM [facility]

## 2023-10-26 ENCOUNTER — Encounter: Payer: Self-pay | Admitting: Internal Medicine

## 2023-10-28 ENCOUNTER — Encounter: Payer: Self-pay | Admitting: Internal Medicine

## 2023-10-28 ENCOUNTER — Other Ambulatory Visit: Payer: Self-pay | Admitting: Internal Medicine

## 2023-10-28 DIAGNOSIS — Z0001 Encounter for general adult medical examination with abnormal findings: Secondary | ICD-10-CM | POA: Insufficient documentation

## 2023-10-28 DIAGNOSIS — B0229 Other postherpetic nervous system involvement: Secondary | ICD-10-CM | POA: Insufficient documentation

## 2023-10-28 NOTE — Assessment & Plan Note (Signed)
Lab Results  Component Value Date   HGBA1C 5.7 11/09/2022   Stable, pt to continue current medical treatment   - diet, wt control

## 2023-10-28 NOTE — Assessment & Plan Note (Addendum)
 BP Readings from Last 3 Encounters:  10/25/23 (!) 140/68  09/14/23 (!) 166/65  07/23/23 (!) 158/61   unocntrolled pt to continue medical treatment norvasc  10 every day, catapres  0.2 bid, hydralazine  100 tid, diovan  320 every day - declines other change today

## 2023-10-28 NOTE — Assessment & Plan Note (Signed)
 Age and sex appropriate education and counseling updated with regular exercise and diet Referrals for preventative services - none needed Immunizations addressed - for tdap and shingrix at pharmacy Smoking counseling  - none needed Evidence for depression or other mood disorder - none significant Most recent labs reviewed. I have personally reviewed and have noted: 1) the patient's medical and social history 2) The patient's current medications and supplements 3) The patient's height, weight, and BMI have been recorded in the chart

## 2023-10-28 NOTE — Assessment & Plan Note (Signed)
 Lab Results  Component Value Date   LDLCALC 89 11/09/2022   Stable, pt to continue current statin lovastatin  40 qhs

## 2023-10-28 NOTE — Assessment & Plan Note (Signed)
Last vitamin D Lab Results  Component Value Date   VD25OH 21.95 (L) 02/01/2023   Low, to start oral replacement

## 2023-10-28 NOTE — Assessment & Plan Note (Signed)
 Mod to severe, to continue gabapentin  600 tid, prednisone  taper, hydrocodone  5 325 prn

## 2023-10-29 ENCOUNTER — Other Ambulatory Visit: Payer: Self-pay

## 2023-10-29 ENCOUNTER — Other Ambulatory Visit: Payer: Self-pay | Admitting: Internal Medicine

## 2023-10-29 ENCOUNTER — Encounter: Payer: Self-pay | Admitting: Internal Medicine

## 2023-10-29 DIAGNOSIS — M150 Primary generalized (osteo)arthritis: Secondary | ICD-10-CM

## 2023-10-30 MED ORDER — TRAMADOL HCL 50 MG PO TABS: 50.0000 mg | ORAL_TABLET | Freq: Four times a day (QID) | ORAL | 2 refills | Status: DC | PRN

## 2023-11-04 ENCOUNTER — Other Ambulatory Visit: Payer: Self-pay | Admitting: Internal Medicine

## 2023-11-05 ENCOUNTER — Other Ambulatory Visit: Payer: Self-pay | Admitting: Internal Medicine

## 2023-11-05 MED ORDER — TRIAMCINOLONE ACETONIDE 0.1 % EX CREA: TOPICAL_CREAM | Freq: Two times a day (BID) | CUTANEOUS | 1 refills | Status: DC

## 2023-11-05 MED ORDER — GABAPENTIN 600 MG PO TABS: 600.0000 mg | ORAL_TABLET | Freq: Three times a day (TID) | ORAL | 1 refills | Status: DC

## 2023-11-05 MED ORDER — TRAMADOL HCL 50 MG PO TABS: 50.0000 mg | ORAL_TABLET | Freq: Four times a day (QID) | ORAL | 2 refills | Status: DC | PRN

## 2023-11-05 MED ORDER — ALPRAZOLAM 0.25 MG PO TABS: 0.2500 mg | ORAL_TABLET | Freq: Two times a day (BID) | ORAL | 2 refills | Status: DC | PRN

## 2023-11-05 NOTE — Addendum Note (Signed)
 Addended by: [name] on: 11/05/2023 12:00 PM   Modules accepted: Orders

## 2023-11-05 NOTE — Telephone Encounter (Signed)
 Copied from CRM [id number]. Topic: Clinical - Medication Refill >> Nov 05, 2023  8:31 AM Montie POUR wrote: Medication: gabapentin  (NEURONTIN ) 600 MG tablet  Has the patient contacted their pharmacy? Yes (Agent: If no, request that the patient contact the pharmacy for the refill. If patient does not wish to contact the pharmacy document the reason why and proceed with request.) (Agent: If yes, when and what did the pharmacy advise?) Pharmacy needs order to refill  This is the patient's preferred pharmacy:  [id number] - [place], [place] - [address] AT [facility] [address]  [place] [address] Phone: [phone] Fax: [phone]  Is this the correct pharmacy for this prescription? Yes If no, delete pharmacy and type the correct one.   Has the prescription been filled recently? No  Is the patient out of the medication? Yes - She has been out since Friday 11/02/23  Has the patient been seen for an appointment in the last year OR does the patient have an upcoming appointment? Yes  Can we respond through MyChart? Yes- Daughter sent a message through MyChart about medication refills. Please check note  Agent: Please be advised that Rx refills may take up to 3 business days. We ask that you follow-up with your pharmacy.

## 2023-11-06 ENCOUNTER — Emergency Department (HOSPITAL_COMMUNITY): Admission: EM | Admit: 2023-11-06 | Discharge: 2023-11-06 | Disposition: A | Discharge status: Home / Self Care

## 2023-11-06 ENCOUNTER — Encounter (HOSPITAL_COMMUNITY): Payer: Self-pay | Admitting: *Deleted

## 2023-11-06 ENCOUNTER — Emergency Department (HOSPITAL_COMMUNITY)

## 2023-11-06 ENCOUNTER — Other Ambulatory Visit: Payer: Self-pay

## 2023-11-06 DIAGNOSIS — M19011 Primary osteoarthritis, right shoulder: Secondary | ICD-10-CM | POA: Diagnosis not present

## 2023-11-06 DIAGNOSIS — G8929 Other chronic pain: Secondary | ICD-10-CM | POA: Diagnosis not present

## 2023-11-06 DIAGNOSIS — M25521 Pain in right elbow: Secondary | ICD-10-CM | POA: Insufficient documentation

## 2023-11-06 DIAGNOSIS — M8580 Other specified disorders of bone density and structure, unspecified site: Secondary | ICD-10-CM | POA: Diagnosis not present

## 2023-11-06 DIAGNOSIS — M25511 Pain in right shoulder: Secondary | ICD-10-CM | POA: Diagnosis not present

## 2023-11-06 DIAGNOSIS — M25519 Pain in unspecified shoulder: Secondary | ICD-10-CM | POA: Diagnosis not present

## 2023-11-06 DIAGNOSIS — M19021 Primary osteoarthritis, right elbow: Secondary | ICD-10-CM | POA: Diagnosis not present

## 2023-11-06 DIAGNOSIS — M85811 Other specified disorders of bone density and structure, right shoulder: Secondary | ICD-10-CM | POA: Diagnosis not present

## 2023-11-06 DIAGNOSIS — Z7982 Long term (current) use of aspirin: Secondary | ICD-10-CM | POA: Insufficient documentation

## 2023-11-06 DIAGNOSIS — S42401A Unspecified fracture of lower end of right humerus, initial encounter for closed fracture: Secondary | ICD-10-CM | POA: Diagnosis not present

## 2023-11-06 MED ORDER — OXYCODONE-ACETAMINOPHEN 5-325 MG PO TABS
1.0000 | ORAL_TABLET | Freq: Once | ORAL | Status: AC
  Administered 2023-11-06: 1 via ORAL

## 2023-11-06 MED ORDER — PREDNISONE 50 MG PO TABS
50.0000 mg | ORAL_TABLET | Freq: Every day | ORAL | 0 refills | Status: AC
Start: 2023-11-06 — End: 2023-11-11

## 2023-11-06 MED ORDER — OXYCODONE-ACETAMINOPHEN 5-325 MG PO TABS
2.0000 | ORAL_TABLET | Freq: Once | ORAL | Status: DC
  Filled 2023-11-06: qty 2

## 2023-11-06 NOTE — Progress Notes (Signed)
 Orthopedic Tech Progress Note Patient Details:  Wendy Torres 08/31/1940 [id number]  Ortho Devices Type of Ortho Device: Shoulder immobilizer Ortho Device/Splint Location: right Ortho Device/Splint Interventions: Ordered, Application, Adjustment   Post Interventions Patient Tolerated: Well Instructions Provided: Adjustment of device, Care of device  [name] 11/06/2023, 7:17 PM

## 2023-11-06 NOTE — ED Triage Notes (Signed)
 BIB EMS/ right shoulder/elbow pain x1 month/ worse today/ no recent injury/ no fall/ 122/78/ 68HR/ 96% 4L/ CBG-161/ pt placed in WC/ A&Ox4

## 2023-11-06 NOTE — ED Provider Notes (Signed)
 [facility] AT [facility] Provider Note   CSN: [id number] Arrival date & time: 11/06/23  1533     Patient presents with: Shoulder Pain   Wendy Torres is a 83 y.o. female with right shoulder and elbow pain that has been present over the last month however is worse today.  She has no recent injury and no recent falls, but feels that she cannot lift the arm without pain, has weakness in the right arm as well.  Weakness is primarily present with forward extension and abduction of the shoulder.  She states that she bumped her right elbow while transferring from her walker to another seat earlier today.    Shoulder Pain      Prior to Admission medications   Medication Sig Start Date End Date Taking? Authorizing Provider  acetaminophen  (TYLENOL ) 325 MG tablet Take 2 tablets (650 mg total) by mouth every 6 (six) hours as needed for mild pain (or Fever >/= 101). Patient taking differently: Take 650 mg by mouth every 6 (six) hours as needed for mild pain (pain score 1-3). 02/19/14   Perkins, Alexzandrew L, PA-C  albuterol  (VENTOLIN  HFA) 108 (90 Base) MCG/ACT inhaler Inhale 2 puffs into the lungs every 4 (four) hours as needed for wheezing or shortness of breath. 05/01/23   [name], MD  ALPRAZolam  (XANAX ) 0.25 MG tablet Take 1 tablet (0.25 mg total) by mouth 2 (two) times daily as needed. 11/05/23   [name], MD  amLODipine  (NORVASC ) 10 MG tablet TAKE 1 TABLET BY MOUTH EVERY DAY 09/17/23   [name], MD  aspirin  EC 81 MG tablet Take 1 tablet (81 mg total) by mouth daily. Swallow whole. 01/22/23   Tobie Yetta HERO, MD  Budeson-Glycopyrrol-Formoterol  (BREZTRI  AEROSPHERE) 160-9-4.8 MCG/ACT AERO Inhale 2 puffs into the lungs in the morning and at bedtime. 05/11/23 05/10/24  Mannam, Praveen, MD  cloNIDine  (CATAPRES ) 0.2 MG tablet TAKE 1 TABLET BY MOUTH TWICE  DAILY 10/25/23   [name], MD  Fexofenadine HCl (MUCINEX  ALLERGY PO) Take 1 tablet by mouth as  needed (congestion).    [provider]  Fluticasone-Umeclidin-Vilant (TRELEGY ELLIPTA ) 200-62.5-25 MCG/ACT AEPB 2 samples 10/08/23   Mannam, Praveen, MD  Fluticasone-Umeclidin-Vilant (TRELEGY ELLIPTA ) 200-62.5-25 MCG/ACT AEPB Inhale 1 Inhalation into the lungs daily. 10/15/23   Mannam, Praveen, MD  furosemide  (LASIX ) 40 MG tablet Take 1 tablet (40 mg total) by mouth daily. 02/28/23 10/25/23  Tobb, Kardie, DO  gabapentin  (NEURONTIN ) 600 MG tablet Take 1 tablet (600 mg total) by mouth 3 (three) times daily. 11/05/23   [name], MD  guaiFENesin  (MUCINEX ) 600 MG 12 hr tablet Take 1 tablet (600 mg total) by mouth 2 (two) times daily. 01/19/23   Tobie Yetta HERO, MD  hydrALAZINE  (APRESOLINE ) 100 MG tablet TAKE 1 TABLET BY MOUTH 3 TIMES  DAILY 10/29/23   [name], MD  HYDROcodone -acetaminophen  (NORCO/VICODIN) 5-325 MG tablet Take 1 tablet by mouth every 6 (six) hours as needed. 10/25/23   [name], MD  iron  polysaccharides (NIFEREX) 150 MG capsule TAKE 1 CAPSULE BY MOUTH EVERY DAY 05/21/23   [name], MD  lovastatin  (MEVACOR ) 40 MG tablet TAKE 1 TABLET BY MOUTH AT  BEDTIME 03/19/23   [name], MD  meclizine  (ANTIVERT ) 12.5 MG tablet TAKE 1 TABLET BY MOUTH THREE TIMES A DAY AS NEEDED FOR DIZZINESS 06/04/23   [name], MD  nystatin  (MYCOSTATIN ) 100000 UNIT/ML suspension Swish and spit  5mL two times daily after using Symbicort . Patient taking differently: Use as directed 5 mLs in the mouth or throat in the morning and at bedtime. Swish and spit 11/09/22   [name], MD  pantoprazole  (PROTONIX ) 40 MG tablet TAKE 1 TABLET BY MOUTH DAILY 03/06/23   [name], MD  polyethylene glycol (MIRALAX  / GLYCOLAX ) 17 g packet Take 17 g by mouth daily as needed for mild constipation or moderate constipation.    [provider]  potassium chloride  SA (KLOR-CON  M20) 20 MEQ tablet Take 1 tablet (20 mEq total) by mouth daily. 02/28/23 10/25/23  Tobb, Kardie, DO  predniSONE  (DELTASONE ) 10  MG tablet 3 tabs by mouth per day for 3 days,2tabs per day for 3 days,1tab per day for 3 days 10/25/23   [name], MD  solifenacin  (VESICARE ) 10 MG tablet TAKE 1 TABLET BY MOUTH EVERY DAY 10/29/23   [name], MD  tiZANidine  (ZANAFLEX ) 4 MG tablet TAKE 1 TABLET BY MOUTH EVERY 6 HOURS AS NEEDED FOR MUSCLE SPASMS. 10/18/23   [name], MD  traMADol  (ULTRAM ) 50 MG tablet Take 1 tablet (50 mg total) by mouth every 6 (six) hours as needed. for pain 11/05/23   [name], MD  traZODone  (DESYREL ) 50 MG tablet TAKE 1/2 TO 1 TABLET BY MOUTH AT BEDTIME AS NEEDED FOR SLEEP 11/05/23   [name], MD  triamcinolone  cream (KENALOG ) 0.1 % Apply topically 2 (two) times daily. 11/05/23   [name], MD  valACYclovir  (VALTREX ) 1000 MG tablet Take 1 tab by mouth three times per day 09/28/23   [name], MD  valsartan  (DIOVAN ) 320 MG tablet Take 1 tablet (320 mg total) by mouth daily. 07/23/23   Meng, Hao, PA    Allergies: Chlorthalidone and Benazepril     Review of Systems  Musculoskeletal:  Positive for arthralgias.  All other systems reviewed and are negative.   Updated Vital Signs BP (!) 154/59 (BP Location: Left Arm)   Pulse 62   Temp 98.4 F (36.9 C) (Oral)   Resp 16   Wt 88.9 kg   SpO2 94%   BMI 33.64 kg/m   Physical Exam Vitals and nursing note reviewed.  Constitutional:      General: She is not in acute distress.    Appearance: Normal appearance.  HENT:     Head: Normocephalic and atraumatic.     Mouth/Throat:     Mouth: Mucous membranes are moist.     Pharynx: Oropharynx is clear.  Eyes:     Extraocular Movements: Extraocular movements intact.     Conjunctiva/sclera: Conjunctivae normal.     Pupils: Pupils are equal, round, and reactive to light.  Cardiovascular:     Rate and Rhythm: Normal rate and regular rhythm.     Pulses: Normal pulses.     Heart sounds: Normal heart sounds. No murmur heard.    No friction rub. No gallop.  Pulmonary:     Effort: Pulmonary  effort is normal.     Breath sounds: Normal breath sounds.  Abdominal:     General: Abdomen is flat. Bowel sounds are normal.     Palpations: Abdomen is soft.  Musculoskeletal:     Right shoulder: Tenderness present. No bony tenderness or crepitus. Decreased range of motion. Decreased strength.     Left shoulder: Normal.     Right elbow: Swelling present. Tenderness present in olecranon process.     Left elbow: Normal.  Cervical back: Normal range of motion and neck supple.     Right lower leg: No edema.     Left lower leg: No edema.  Skin:    General: Skin is warm and dry.     Capillary Refill: Capillary refill takes less than 2 seconds.  Neurological:     General: No focal deficit present.     Mental Status: She is alert. Mental status is at baseline.  Psychiatric:        Mood and Affect: Mood normal.     (all labs ordered are listed, but only abnormal results are displayed) Labs Reviewed - No data to display  EKG: None  Radiology: DG Elbow Complete Right Result Date: 11/06/2023 CLINICAL DATA:  Right shoulder and elbow pain EXAM: RIGHT ELBOW - COMPLETE 3+ VIEW COMPARISON:  None Available. FINDINGS: Osteopenia.No acute fracture or dislocation. Joint effusion present. Severe joint space loss and osteophyte formation throughout the elbow joint. Soft tissues are unremarkable. IMPRESSION: 1. A joint effusion is present, which in the absence of known trauma, may be due to the underlying degenerative changes of the elbow. If concern persists for a nondisplaced fracture, a nonemergent MRI of the elbow may be of benefit for further characterization. 2. 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 Electronically Signed   By: Rogelia Myers M.D.   On: 11/06/2023 17:15   DG Shoulder Right Result Date: 11/06/2023 CLINICAL DATA:  Right shoulder pain EXAM: RIGHT SHOULDER - 2+ VIEW COMPARISON:  None Available. FINDINGS: Osteopenia.No acute fracture or dislocation.  Moderate joint space loss of the AC and glenohumeral joints. Soft tissues are unremarkable. IMPRESSION: 1. Osteopenia.  No acute fracture or dislocation. 2. Moderate AC and glenohumeral joint osteoarthritis. Electronically Signed   By: Rogelia Myers M.D.   On: 11/06/2023 17:13     Procedures   Medications Ordered in the ED  oxyCODONE -acetaminophen  (PERCOCET/ROXICET) 5-325 MG per tablet 1-2 tablet (1 tablet Oral Given 11/06/23 1647)                                    Medical Decision Making Amount and/or Complexity of Data Reviewed Radiology: ordered.  Risk Prescription drug management.   Medical Decision Making:   JODELL WEITMAN is a 83 y.o. female who presented to the ED today with right shoulder and elbow pain that is chronic detailed above.     Complete initial physical exam performed, notably the patient  was alert and oriented in no apparent distress.  Notable exam findings is of tenderness to the olecranon of the right elbow along with weakness with forward extension and abduction at the right shoulder..    Reviewed and confirmed nursing documentation for past medical history, family history, social history.    Initial Assessment:   With the patient's presentation of shoulder and elbow pain, most likely diagnosis is continued sequelae of osteoarthritis. Other diagnoses were considered including (but not limited to) new onset of traumatic injury/bony fractures of the affected areas.. These are considered less likely due to history of present illness and physical exam findings.     Initial Plan:  Plain films of the right elbow and right shoulder Based on physical evaluation and evaluation of patient's history of present illness, defer further lab workup at this time. Objective evaluation as below reviewed   Initial Study Results:    Radiology:  All images reviewed independently. Agree with radiology report at  this time.   DG Elbow Complete Right Result Date:  11/06/2023 CLINICAL DATA:  Right shoulder and elbow pain EXAM: RIGHT ELBOW - COMPLETE 3+ VIEW COMPARISON:  None Available. FINDINGS: Osteopenia.No acute fracture or dislocation. Joint effusion present. Severe joint space loss and osteophyte formation throughout the elbow joint. Soft tissues are unremarkable. IMPRESSION: 1. A joint effusion is present, which in the absence of known trauma, may be due to the underlying degenerative changes of the elbow. If concern persists for a nondisplaced fracture, a nonemergent MRI of the elbow may be of benefit for further characterization. 2. 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 Electronically Signed   By: Rogelia Myers M.D.   On: 11/06/2023 17:15   DG Shoulder Right Result Date: 11/06/2023 CLINICAL DATA:  Right shoulder pain EXAM: RIGHT SHOULDER - 2+ VIEW COMPARISON:  None Available. FINDINGS: Osteopenia.No acute fracture or dislocation. Moderate joint space loss of the AC and glenohumeral joints. Soft tissues are unremarkable. IMPRESSION: 1. Osteopenia.  No acute fracture or dislocation. 2. Moderate AC and glenohumeral joint osteoarthritis. Electronically Signed   By: Rogelia Myers M.D.   On: 11/06/2023 17:13      Reassessment and Plan:   X-ray findings of the right elbow shows effusion, this is consistent with the patient's report of her elbow being bumped when she was transferring from her walker earlier today.  As such, will have patient placed in a sling of the right shoulder for immobilization, follow-up with primary care for continued management, can return to ED should pain worsen.  There are no other acute bony changes that were noted on her x-rays, no fractures or no dislocations.  Chronic arthritic changes are noted.  Again as such we will have patient follow-up with primary care for continued management, also give patient a outpatient course of prednisone  for management of arthritic pain.       Final diagnoses:  None     ED Discharge Orders     None          Myriam Dorn BROCKS, [place] 11/06/23 1903    Mannie Pac T, DO 11/07/23 [phone]

## 2023-11-06 NOTE — ED Notes (Signed)
 Family at side. EDPA at side.

## 2023-11-08 ENCOUNTER — Other Ambulatory Visit: Payer: Self-pay | Admitting: Oncology

## 2023-11-08 DIAGNOSIS — D509 Iron deficiency anemia, unspecified: Secondary | ICD-10-CM

## 2023-11-09 ENCOUNTER — Inpatient Hospital Stay

## 2023-11-09 ENCOUNTER — Inpatient Hospital Stay: Admitting: Oncology

## 2023-11-10 ENCOUNTER — Other Ambulatory Visit: Payer: Self-pay | Admitting: Internal Medicine

## 2023-11-14 ENCOUNTER — Other Ambulatory Visit: Payer: Self-pay | Admitting: Internal Medicine

## 2023-11-15 ENCOUNTER — Encounter: Admitting: Internal Medicine

## 2023-11-18 ENCOUNTER — Other Ambulatory Visit: Payer: Self-pay | Admitting: Cardiology

## 2023-11-21 ENCOUNTER — Encounter: Payer: Self-pay | Admitting: Internal Medicine

## 2023-11-21 ENCOUNTER — Ambulatory Visit: Admitting: Internal Medicine

## 2023-11-21 VITALS — BP 130/64 | HR 57 | Temp 98.2°F

## 2023-11-21 DIAGNOSIS — R490 Dysphonia: Secondary | ICD-10-CM

## 2023-11-21 DIAGNOSIS — E785 Hyperlipidemia, unspecified: Secondary | ICD-10-CM | POA: Diagnosis not present

## 2023-11-21 DIAGNOSIS — I5032 Chronic diastolic (congestive) heart failure: Secondary | ICD-10-CM | POA: Diagnosis not present

## 2023-11-21 DIAGNOSIS — E559 Vitamin D deficiency, unspecified: Secondary | ICD-10-CM

## 2023-11-21 DIAGNOSIS — R7303 Prediabetes: Secondary | ICD-10-CM | POA: Diagnosis not present

## 2023-11-21 DIAGNOSIS — I10 Essential (primary) hypertension: Secondary | ICD-10-CM

## 2023-11-21 DIAGNOSIS — D509 Iron deficiency anemia, unspecified: Secondary | ICD-10-CM

## 2023-11-21 DIAGNOSIS — J4489 Other specified chronic obstructive pulmonary disease: Secondary | ICD-10-CM | POA: Diagnosis not present

## 2023-11-21 DIAGNOSIS — J439 Emphysema, unspecified: Secondary | ICD-10-CM

## 2023-11-21 LAB — LIPID PANEL
Cholesterol: 158 mg/dL (ref 0–200)
HDL: 66.3 mg/dL (ref >39.00)
LDL Cholesterol: 79 mg/dL (ref 0–99)
NonHDL: 91.96
Total CHOL/HDL Ratio: 2
Triglycerides: 67 mg/dL (ref 0.0–149.0)
VLDL: 13.4 mg/dL (ref 0.0–40.0)

## 2023-11-21 LAB — HEPATIC FUNCTION PANEL
ALT: 9 U/L (ref 0–35)
AST: 15 U/L (ref 0–37)
Albumin: 4.1 g/dL (ref 3.5–5.2)
Alkaline Phosphatase: 53 U/L (ref 39–117)
Bilirubin, Direct: 0 mg/dL (ref 0.0–0.3)
Total Bilirubin: 0.3 mg/dL (ref 0.2–1.2)
Total Protein: 7.8 g/dL (ref 6.0–8.3)

## 2023-11-21 LAB — BASIC METABOLIC PANEL WITH GFR
BUN: 14 mg/dL (ref 6–23)
CO2: 33 meq/L — ABNORMAL HIGH (ref 19–32)
Calcium: 11.4 mg/dL — ABNORMAL HIGH (ref 8.4–10.5)
Chloride: 94 meq/L — ABNORMAL LOW (ref 96–112)
Creatinine, Ser: 1.21 mg/dL — ABNORMAL HIGH (ref 0.40–1.20)
GFR: 41.07 mL/min — ABNORMAL LOW (ref >60.00)
Glucose, Bld: 90 mg/dL (ref 70–99)
Potassium: 4.3 meq/L (ref 3.5–5.1)
Sodium: 134 meq/L — ABNORMAL LOW (ref 135–145)

## 2023-11-21 LAB — IBC PANEL
Iron: 21 ug/dL — ABNORMAL LOW (ref 42–145)
Saturation Ratios: 6.5 % — ABNORMAL LOW (ref 20.0–50.0)
TIBC: 324.8 ug/dL (ref 250.0–450.0)
Transferrin: 232 mg/dL (ref 212.0–360.0)

## 2023-11-21 LAB — FERRITIN: Ferritin: 50.6 ng/mL (ref 10.0–291.0)

## 2023-11-21 LAB — TSH: TSH: 1.48 u[IU]/mL (ref 0.35–5.50)

## 2023-11-21 NOTE — Assessment & Plan Note (Signed)
Stable overall, cont current med tx 

## 2023-11-21 NOTE — Assessment & Plan Note (Signed)
Stable overall, cont inhaler asd

## 2023-11-21 NOTE — Assessment & Plan Note (Signed)
Last vitamin D Lab Results  Component Value Date   VD25OH 21.95 (L) 02/01/2023   Low, to start oral replacement

## 2023-11-21 NOTE — Patient Instructions (Signed)
 Please continue all other medications as before, and refills have been done if requested.  Please have the pharmacy call with any other refills you may need.  Please continue your efforts at being more active, low cholesterol diet, and weight control.  You are otherwise up to date with prevention measures today.  Please keep your appointments with your specialists as you may have planned  Remember to call your ENT if the hoarsness persists  Please go to the LAB at the blood drawing area for the tests to be done  You will be contacted by phone if any changes need to be made immediately.  Otherwise, you will receive a letter about your results with an explanation, but please check with MyChart first.  Please make an Appointment to return in 6 months, or sooner if needed

## 2023-11-21 NOTE — Assessment & Plan Note (Signed)
Also for f/u iron lab today

## 2023-11-21 NOTE — Assessment & Plan Note (Signed)
 BP Readings from Last 3 Encounters:  11/21/23 130/64  11/06/23 (!) 148/69  10/25/23 (!) 140/68   Stable, pt to continue medical treatment diovan  320 mg every day, hydralazine  100 tid, clonidine  0.2 bid, norvasc  10 qd

## 2023-11-21 NOTE — Progress Notes (Signed)
 Patient ID: Wendy Torres, female   DOB: 12/13/1940, 83 y.o.   MRN: [id number]        Chief Complaint: follow up with PHN left forehead, mild intermittent hoarseness, chf, copd       HPI:  Wendy Torres is a 83 y.o. female here with daughter overall doing ok,  Pt denies chest pain, increased sob or doe, wheezing, orthopnea, PND, increased LE swelling, palpitations, dizziness or syncope.   Pt denies polydipsia, polyuria, or new focal neuro s/s.    Pt denies fever, wt loss, night sweats, loss of appetite, or other constitutional symptoms  No GU symptoms.  Does have mild intermittent hoarseness for the past month for unclear reason, also shingles rash has essentially resolved, but has persistent neuritic pain left forehead despite the gabapentin  600 tid  Pt is to cont 4L home o2  [place]il continous. Wt Readings from Last 3 Encounters:  11/06/23 196 lb (88.9 kg)  10/25/23 199 lb (90.3 kg)  09/13/23 201 lb (91.2 kg)   BP Readings from Last 3 Encounters:  11/21/23 130/64  11/06/23 (!) 148/69  10/25/23 (!) 140/68         Past Medical History:  Diagnosis Date   ALLERGIC RHINITIS 12/09/2006   ANEMIA 01/14/2009   Anxiety    Arthritis    ASTHMA 12/09/2006   Cataract    bilateral -removed   CHF (congestive heart failure) (HCC)    Chronic pain syndrome 02/16/2009   COPD 12/09/2006   DEGENERATIVE JOINT DISEASE 12/09/2006   DIZZINESS 03/30/2010   DYSPNEA 01/27/2010   with heavy exertion   FREQUENCY, URINARY 03/30/2010   GERD 12/09/2006   HELICOBACTER PYLORI GASTRITIS, HX OF 12/09/2006   HIATAL HERNIA 03/01/2010   HYPERLIPIDEMIA 04/18/2007   HYPERSOMNIA 08/31/2008   HYPERTENSION 12/09/2006   INSOMNIA-SLEEP DISORDER-UNSPEC 11/05/2008   LOW BACK PAIN 12/09/2006   Morbid obesity (HCC) 12/09/2006   Nonspecific (abnormal) findings on radiological and other examination of body structure 11/05/2008   OSTEOPOROSIS 12/09/2006   OVERACTIVE BLADDER 04/14/2008   PALPITATIONS, RECURRENT 01/27/2010    Stroke (HCC)    pt. stated light one years ago   SYNCOPE 11/05/2008   TRANSIENT ISCHEMIC ATTACK, HX OF 12/09/2006   Past Surgical History:  Procedure Laterality Date   BIOPSY  01/18/2023   Procedure: BIOPSY;  Surgeon: Aneita Gwendlyn DASEN, MD;  Location: [facility];  Service: Gastroenterology;;   COLONOSCOPY     ESOPHAGOGASTRODUODENOSCOPY (EGD) WITH PROPOFOL  N/A 01/18/2023   Procedure: ESOPHAGOGASTRODUODENOSCOPY (EGD) WITH PROPOFOL ;  Surgeon: Aneita Gwendlyn DASEN, MD;  Location: [facility];  Service: Gastroenterology;  Laterality: N/A;   HOT HEMOSTASIS N/A 01/18/2023   Procedure: HOT HEMOSTASIS (ARGON PLASMA COAGULATION/BICAP);  Surgeon: Aneita Gwendlyn DASEN, MD;  Location: [facility];  Service: Gastroenterology;  Laterality: N/A;   LUMBAR DISC SURGERY     TONSILLECTOMY     as a child   TOTAL HIP ARTHROPLASTY Right 11/06/2012   Procedure: RIGHT TOTAL HIP ARTHROPLASTY WITH ACETABULAR AUTOGRAFT;  Surgeon: Dempsey LULLA Moan, MD;  Location: [facility];  Service: Orthopedics;  Laterality: Right;   TOTAL HIP ARTHROPLASTY Left 02/18/2014   Procedure: LEFT TOTAL HIP ARTHROPLASTY;  Surgeon: Dempsey Moan LULLA, MD;  Location: [facility];  Service: Orthopedics;  Laterality: Left;   TUBAL LIGATION      reports that she quit smoking about 51 years ago. Her smoking use included cigarettes. She started smoking about 76 years ago. She has a 12.5 pack-year smoking history. She has never used smokeless tobacco. She  reports that she does not drink alcohol  and does not use drugs. family history includes Heart disease in her mother; Hypertension in her brother; Lung cancer in her father. Allergies  Allergen Reactions   Chlorthalidone Shortness Of Breath   Benazepril  Swelling   Current Outpatient Medications on File Prior to Visit  Medication Sig Dispense Refill   acetaminophen  (TYLENOL ) 325 MG tablet Take 2 tablets (650 mg total) by mouth every 6 (six) hours as needed for mild pain (or Fever >/= 101). 40 tablet 0   albuterol   (VENTOLIN  HFA) 108 (90 Base) MCG/ACT inhaler Inhale 2 puffs into the lungs every 4 (four) hours as needed for wheezing or shortness of breath. 8.5 each 1   ALPRAZolam  (XANAX ) 0.25 MG tablet Take 1 tablet (0.25 mg total) by mouth 2 (two) times daily as needed. 60 tablet 2   amLODipine  (NORVASC ) 10 MG tablet TAKE 1 TABLET BY MOUTH EVERY DAY 90 tablet 3   aspirin  EC 81 MG tablet Take 1 tablet (81 mg total) by mouth daily. Swallow whole.     Budeson-Glycopyrrol-Formoterol  (BREZTRI  AEROSPHERE) 160-9-4.8 MCG/ACT AERO Inhale 2 puffs into the lungs in the morning and at bedtime. 3 each 3   cloNIDine  (CATAPRES ) 0.2 MG tablet TAKE 1 TABLET BY MOUTH TWICE  DAILY 180 tablet 1   Fexofenadine HCl (MUCINEX  ALLERGY PO) Take 1 tablet by mouth as needed (congestion).     Fluticasone-Umeclidin-Vilant (TRELEGY ELLIPTA ) 200-62.5-25 MCG/ACT AEPB 2 samples 3 each 3   Fluticasone-Umeclidin-Vilant (TRELEGY ELLIPTA ) 200-62.5-25 MCG/ACT AEPB Inhale 1 Inhalation into the lungs daily.     furosemide  (LASIX ) 40 MG tablet Take 1 tablet (40 mg total) by mouth daily. 90 tablet 3   gabapentin  (NEURONTIN ) 600 MG tablet Take 1 tablet (600 mg total) by mouth 3 (three) times daily. 270 tablet 1   guaiFENesin  (MUCINEX ) 600 MG 12 hr tablet Take 1 tablet (600 mg total) by mouth 2 (two) times daily. 30 tablet 0   hydrALAZINE  (APRESOLINE ) 100 MG tablet TAKE 1 TABLET BY MOUTH 3 TIMES  DAILY 270 tablet 2   HYDROcodone -acetaminophen  (NORCO/VICODIN) 5-325 MG tablet Take 1 tablet by mouth every 6 (six) hours as needed. 30 tablet 0   iron  polysaccharides (NIFEREX) 150 MG capsule TAKE 1 CAPSULE BY MOUTH EVERY DAY 90 capsule 1   lovastatin  (MEVACOR ) 40 MG tablet TAKE 1 TABLET BY MOUTH AT  BEDTIME 100 tablet 2   meclizine  (ANTIVERT ) 12.5 MG tablet TAKE 1 TABLET BY MOUTH THREE TIMES A DAY AS NEEDED FOR DIZZINESS 30 tablet 2   nystatin  (MYCOSTATIN ) 100000 UNIT/ML suspension Swish and spit 5mL two times daily after using Symbicort . 473 mL 5    pantoprazole  (PROTONIX ) 40 MG tablet TAKE 1 TABLET BY MOUTH DAILY 100 tablet 2   polyethylene glycol (MIRALAX  / GLYCOLAX ) 17 g packet Take 17 g by mouth daily as needed for mild constipation or moderate constipation.     potassium chloride  SA (KLOR-CON  M20) 20 MEQ tablet Take 1 tablet (20 mEq total) by mouth daily. 90 tablet 3   predniSONE  (DELTASONE ) 10 MG tablet 3 tabs by mouth per day for 3 days,2tabs per day for 3 days,1tab per day for 3 days 18 tablet 0   solifenacin  (VESICARE ) 10 MG tablet TAKE 1 TABLET BY MOUTH EVERY DAY 90 tablet 2   tiZANidine  (ZANAFLEX ) 4 MG tablet TAKE 1 TABLET BY MOUTH EVERY 6 HOURS AS NEEDED FOR MUSCLE SPASMS. 50 tablet 1   traMADol  (ULTRAM ) 50 MG tablet Take 1 tablet (50 mg  total) by mouth every 6 (six) hours as needed. for pain 120 tablet 2   traZODone  (DESYREL ) 50 MG tablet TAKE 1/2 TO 1 TABLET BY MOUTH AT BEDTIME AS NEEDED FOR SLEEP 100 tablet 1   triamcinolone  cream (KENALOG ) 0.1 % Apply topically 2 (two) times daily. 30 g 1   valACYclovir  (VALTREX ) 1000 MG tablet Take 1 tab by mouth three times per day 21 tablet 0   valsartan  (DIOVAN ) 320 MG tablet Take 1 tablet (320 mg total) by mouth daily. 90 tablet 3   No current facility-administered medications on file prior to visit.        ROS:  All others reviewed and negative.  Objective        PE:  BP 130/64   Pulse (!) 57   Temp 98.2 F (36.8 C) (Temporal)   SpO2 93%                 Constitutional: Pt appears in NAD               HENT: Head: NCAT.                Right Ear: External ear normal.                 Left Ear: External ear normal. , hoarseness raspiness of voice noted               Eyes: . Pupils are equal, round, and reactive to light. Conjunctivae and EOM are normal               Nose: without d/c or deformity               Neck: Neck supple. Gross normal ROM               Cardiovascular: Normal rate and regular rhythm.                 Pulmonary/Chest: Effort normal and breath sounds without  rales or wheezing.                Abd:  Soft, NT, ND, + BS, no organomegaly               Neurological: Pt is alert. At baseline orientation, motor grossly intact               Skin: Skin is warm. No rashes, no other new lesions, LE edema - none               Psychiatric: Pt behavior is normal without agitation   Micro: none  Cardiac tracings I have personally interpreted today:  none  Pertinent Radiological findings (summarize): none   Lab Results  Component Value Date   WBC 7.1 09/13/2023   HGB 10.5 (L) 09/13/2023   HCT 33.9 (L) 09/13/2023   PLT 301 09/13/2023   GLUCOSE 90 11/21/2023   CHOL 158 11/21/2023   TRIG 67.0 11/21/2023   HDL 66.30 11/21/2023   LDLCALC 79 11/21/2023   ALT 9 11/21/2023   AST 15 11/21/2023   NA 134 (L) 11/21/2023   K 4.3 11/21/2023   CL 94 (L) 11/21/2023   CREATININE 1.21 (H) 11/21/2023   BUN 14 11/21/2023   CO2 33 (H) 11/21/2023   TSH 1.48 11/21/2023   INR 1.0 09/13/2023   HGBA1C 5.7 11/09/2022   Assessment/Plan:  Wendy Torres is a 83 y.o. Black or African American [2] female with  has a past medical  history of ALLERGIC RHINITIS (12/09/2006), ANEMIA (01/14/2009), Anxiety, Arthritis, ASTHMA (12/09/2006), Cataract, CHF (congestive heart failure) (HCC), Chronic pain syndrome (02/16/2009), COPD (12/09/2006), DEGENERATIVE JOINT DISEASE (12/09/2006), DIZZINESS (03/30/2010), DYSPNEA (01/27/2010), FREQUENCY, URINARY (03/30/2010), GERD (12/09/2006), HELICOBACTER PYLORI GASTRITIS, HX OF (12/09/2006), HIATAL HERNIA (03/01/2010), HYPERLIPIDEMIA (04/18/2007), HYPERSOMNIA (08/31/2008), HYPERTENSION (12/09/2006), INSOMNIA-SLEEP DISORDER-UNSPEC (11/05/2008), LOW BACK PAIN (12/09/2006), Morbid obesity (HCC) (12/09/2006), Nonspecific (abnormal) findings on radiological and other examination of body structure (11/05/2008), OSTEOPOROSIS (12/09/2006), OVERACTIVE BLADDER (04/14/2008), PALPITATIONS, RECURRENT (01/27/2010), Stroke (HCC), SYNCOPE (11/05/2008), and TRANSIENT  ISCHEMIC ATTACK, HX OF (12/09/2006).  Chronic heart failure with preserved ejection fraction (HFpEF) (HCC) Stable overall, cont current med tx  COPD with chronic bronchitis and emphysema (HCC) Stable overall, cont inhaler asd  Dyslipidemia, goal LDL below 130 Lab Results  Component Value Date   LDLCALC 79 11/21/2023   uncontrolled, pt to continue current statin lovastatin  40 mg every day,, declines change for now   Essential hypertension BP Readings from Last 3 Encounters:  11/21/23 130/64  11/06/23 (!) 148/69  10/25/23 (!) 140/68   Stable, pt to continue medical treatment diovan  320 mg every day, hydralazine  100 tid, clonidine  0.2 bid, norvasc  10 qd   Iron  deficiency anemia Also for f/u iron  lab today  Prediabetes Lab Results  Component Value Date   HGBA1C 5.7 11/09/2022   Stable, pt to continue current medical treatment  - diet,wt control   Vitamin D  deficiency Last vitamin D  Lab Results  Component Value Date   VD25OH 21.95 (L) 02/01/2023   Low, to start oral replacement   Hoarseness Mild intermittent, declines ENT referral but will call herself Followup: Return in about 6 months (around 05/23/2024).  Lynwood Rush, MD 11/21/2023 8:09 PM [facility]

## 2023-11-21 NOTE — Assessment & Plan Note (Signed)
 Lab Results  Component Value Date   LDLCALC 79 11/21/2023   uncontrolled, pt to continue current statin lovastatin  40 mg every day,, declines change for now

## 2023-11-21 NOTE — Assessment & Plan Note (Signed)
 Mild intermittent, declines ENT referral but will call herself

## 2023-11-21 NOTE — Assessment & Plan Note (Signed)
Lab Results  Component Value Date   HGBA1C 5.7 11/09/2022   Stable, pt to continue current medical treatment   - diet, wt control

## 2023-11-22 ENCOUNTER — Ambulatory Visit: Payer: Self-pay | Admitting: Internal Medicine

## 2023-11-23 ENCOUNTER — Other Ambulatory Visit: Payer: Self-pay | Admitting: Internal Medicine

## 2023-11-25 ENCOUNTER — Other Ambulatory Visit: Payer: Self-pay

## 2023-11-25 ENCOUNTER — Emergency Department (HOSPITAL_COMMUNITY)
Admission: EM | Admit: 2023-11-25 | Discharge: 2023-11-25 | Disposition: A | Discharge status: Home / Self Care | Attending: Emergency Medicine | Admitting: Emergency Medicine

## 2023-11-25 ENCOUNTER — Emergency Department (HOSPITAL_COMMUNITY)

## 2023-11-25 DIAGNOSIS — I509 Heart failure, unspecified: Secondary | ICD-10-CM | POA: Insufficient documentation

## 2023-11-25 DIAGNOSIS — Z7982 Long term (current) use of aspirin: Secondary | ICD-10-CM | POA: Diagnosis not present

## 2023-11-25 DIAGNOSIS — R270 Ataxia, unspecified: Secondary | ICD-10-CM | POA: Diagnosis not present

## 2023-11-25 DIAGNOSIS — Z87891 Personal history of nicotine dependence: Secondary | ICD-10-CM | POA: Diagnosis not present

## 2023-11-25 DIAGNOSIS — J449 Chronic obstructive pulmonary disease, unspecified: Secondary | ICD-10-CM | POA: Diagnosis not present

## 2023-11-25 DIAGNOSIS — Z79899 Other long term (current) drug therapy: Secondary | ICD-10-CM | POA: Insufficient documentation

## 2023-11-25 DIAGNOSIS — J45909 Unspecified asthma, uncomplicated: Secondary | ICD-10-CM | POA: Insufficient documentation

## 2023-11-25 DIAGNOSIS — I10 Essential (primary) hypertension: Secondary | ICD-10-CM | POA: Diagnosis not present

## 2023-11-25 DIAGNOSIS — G4489 Other headache syndrome: Secondary | ICD-10-CM | POA: Diagnosis not present

## 2023-11-25 DIAGNOSIS — R0902 Hypoxemia: Secondary | ICD-10-CM | POA: Diagnosis not present

## 2023-11-25 DIAGNOSIS — S0990XA Unspecified injury of head, initial encounter: Secondary | ICD-10-CM | POA: Diagnosis not present

## 2023-11-25 DIAGNOSIS — J0110 Acute frontal sinusitis, unspecified: Secondary | ICD-10-CM | POA: Insufficient documentation

## 2023-11-25 DIAGNOSIS — S199XXA Unspecified injury of neck, initial encounter: Secondary | ICD-10-CM | POA: Diagnosis not present

## 2023-11-25 DIAGNOSIS — I110 Hypertensive heart disease with heart failure: Secondary | ICD-10-CM | POA: Insufficient documentation

## 2023-11-25 DIAGNOSIS — R519 Headache, unspecified: Secondary | ICD-10-CM | POA: Diagnosis not present

## 2023-11-25 DIAGNOSIS — Z96643 Presence of artificial hip joint, bilateral: Secondary | ICD-10-CM | POA: Diagnosis not present

## 2023-11-25 DIAGNOSIS — R059 Cough, unspecified: Secondary | ICD-10-CM | POA: Diagnosis not present

## 2023-11-25 LAB — URINALYSIS, W/ REFLEX TO CULTURE (INFECTION SUSPECTED)
Bilirubin Urine: NEGATIVE
Glucose, UA: NEGATIVE mg/dL
Hgb urine dipstick: NEGATIVE
Ketones, ur: NEGATIVE mg/dL
Leukocytes,Ua: NEGATIVE
Nitrite: NEGATIVE
Protein, ur: NEGATIVE mg/dL
Specific Gravity, Urine: 1.006 (ref 1.005–1.030)
pH: 7 (ref 5.0–8.0)

## 2023-11-25 LAB — BASIC METABOLIC PANEL WITH GFR
Anion gap: 10 (ref 5–15)
BUN: 13 mg/dL (ref 8–23)
CO2: 28 mmol/L (ref 22–32)
Calcium: 10 mg/dL (ref 8.9–10.3)
Chloride: 97 mmol/L — ABNORMAL LOW (ref 98–111)
Creatinine, Ser: 1.06 mg/dL — ABNORMAL HIGH (ref 0.44–1.00)
GFR, Estimated: 52 mL/min — ABNORMAL LOW (ref >60)
Glucose, Bld: 97 mg/dL (ref 70–99)
Potassium: 3.9 mmol/L (ref 3.5–5.1)
Sodium: 135 mmol/L (ref 135–145)

## 2023-11-25 LAB — CBC
HCT: 30.5 % — ABNORMAL LOW (ref 36.0–46.0)
Hemoglobin: 9.3 g/dL — ABNORMAL LOW (ref 12.0–15.0)
MCH: 27 pg (ref 26.0–34.0)
MCHC: 30.5 g/dL (ref 30.0–36.0)
MCV: 88.7 fL (ref 80.0–100.0)
Platelets: 287 K/uL (ref 150–400)
RBC: 3.44 MIL/uL — ABNORMAL LOW (ref 3.87–5.11)
RDW: 14.4 % (ref 11.5–15.5)
WBC: 7.4 K/uL (ref 4.0–10.5)
nRBC: 0 % (ref 0.0–0.2)

## 2023-11-25 LAB — BLOOD GAS, VENOUS
Acid-Base Excess: 8.3 mmol/L — ABNORMAL HIGH (ref 0.0–2.0)
Bicarbonate: 34 mmol/L — ABNORMAL HIGH (ref 20.0–28.0)
O2 Saturation: 86.3 %
Patient temperature: 37
pCO2, Ven: 50 mmHg (ref 44–60)
pH, Ven: 7.44 — ABNORMAL HIGH (ref 7.25–7.43)
pO2, Ven: 51 mmHg — ABNORMAL HIGH (ref 32–45)

## 2023-11-25 LAB — BRAIN NATRIURETIC PEPTIDE: B Natriuretic Peptide: 27.3 pg/mL (ref 0.0–100.0)

## 2023-11-25 MED ORDER — METOCLOPRAMIDE HCL 5 MG/ML IJ SOLN
10.0000 mg | Freq: Once | INTRAMUSCULAR | Status: AC
  Administered 2023-11-25: 10 mg via INTRAVENOUS
  Filled 2023-11-25: qty 2

## 2023-11-25 MED ORDER — AMOXICILLIN-POT CLAVULANATE 875-125 MG PO TABS: 1.0000 | ORAL_TABLET | Freq: Two times a day (BID) | ORAL | 0 refills | Status: DC

## 2023-11-25 MED ORDER — AMOXICILLIN-POT CLAVULANATE 875-125 MG PO TABS
1.0000 | ORAL_TABLET | Freq: Once | ORAL | Status: AC
  Administered 2023-11-25: 1 via ORAL
  Filled 2023-11-25: qty 1

## 2023-11-25 MED ORDER — CLONIDINE HCL 0.1 MG PO TABS
0.2000 mg | ORAL_TABLET | Freq: Once | ORAL | Status: AC
  Administered 2023-11-25: 0.2 mg via ORAL
  Filled 2023-11-25: qty 2

## 2023-11-25 MED ORDER — ACETAMINOPHEN 500 MG PO TABS
1000.0000 mg | ORAL_TABLET | Freq: Once | ORAL | Status: AC
  Administered 2023-11-25: 1000 mg via ORAL
  Filled 2023-11-25: qty 2

## 2023-11-25 NOTE — ED Triage Notes (Signed)
 Pt arrived via [facility] from home for Headaches for about a week, no blur vision denies n/v/d. Hx hypertension. 176/60 86hr 99c bg 99.5

## 2023-11-25 NOTE — Discharge Instructions (Addendum)
 While you were in the emergency room you had blood work done that was normal.  You have a sinus infection.  I have sent you some Augmentin .  You can take this 2 times per day for the next 1 week.  Please follow-up with your primary care doctor.  Return to the emergency room for worsening headache, trouble with your vision or more frequent falls.

## 2023-11-25 NOTE — ED Notes (Addendum)
 Per IV team.... MD and charge made aware  Hello! Melanie with IV team here. What is this IV for? Thank you!  3 mins labs and meds possible CTA.  2 mins MA Andrea Fail, RN All I see is Tylenol  ordered?  2 mins yes.  can you start an IV for her?  1 min MA Melanie Andes, RN As soon as she has IV meds or a CTA I will be happy to come start a line for her! Until then let's hold off just in case we can save her a stick.   Now I will document and let the provider know.  Now MA Andrea Fail, RN Unless you are concerned about her deteriorating rapidly?

## 2023-11-25 NOTE — Progress Notes (Signed)
 IV team consult placed per primary RN. No indication for IV access present at this time. Vital signs appear stable. To return for changes in vascular access needs.

## 2023-11-25 NOTE — ED Provider Notes (Signed)
 [place] [facility]ERGENCY DEPARTMENT AT [facility] Provider Note  CSN: [id number] Arrival date & time: 11/25/23 9082  Chief Complaint(s) Headache and Hypertension  HPI Wendy Torres is a 83 y.o. female who is here today with a headache.  Patient has been having headache for 1 week, frequent falls.  She is here with her daughter helps provide history.  Patient with history of heart failure, chronic pain, hyperlipidemia, recent treatment for shingles on the face.  Reportedly, she has had a couple of falls over the last 1 week.  Has been endorsing headache before and since the falls.  No trouble with her vision.   Past Medical History Past Medical History:  Diagnosis Date   ALLERGIC RHINITIS 12/09/2006   ANEMIA 01/14/2009   Anxiety    Arthritis    ASTHMA 12/09/2006   Cataract    bilateral -removed   CHF (congestive heart failure) (HCC)    Chronic pain syndrome 02/16/2009   COPD 12/09/2006   DEGENERATIVE JOINT DISEASE 12/09/2006   DIZZINESS 03/30/2010   DYSPNEA 01/27/2010   with heavy exertion   FREQUENCY, URINARY 03/30/2010   GERD 12/09/2006   HELICOBACTER PYLORI GASTRITIS, HX OF 12/09/2006   HIATAL HERNIA 03/01/2010   HYPERLIPIDEMIA 04/18/2007   HYPERSOMNIA 08/31/2008   HYPERTENSION 12/09/2006   INSOMNIA-SLEEP DISORDER-UNSPEC 11/05/2008   LOW BACK PAIN 12/09/2006   Morbid obesity (HCC) 12/09/2006   Nonspecific (abnormal) findings on radiological and other examination of body structure 11/05/2008   OSTEOPOROSIS 12/09/2006   OVERACTIVE BLADDER 04/14/2008   PALPITATIONS, RECURRENT 01/27/2010   Stroke (HCC)    pt. stated light one years ago   SYNCOPE 11/05/2008   TRANSIENT ISCHEMIC ATTACK, HX OF 12/09/2006   Patient Active Problem List   Diagnosis Date Noted   Hoarseness 11/21/2023   Encounter for well adult exam with abnormal findings 10/28/2023   PHN (postherpetic neuralgia) 10/28/2023   Shingles outbreak 09/29/2023   Productive cough 04/30/2023    Cervicalgia 02/23/2023   Persistent headaches 02/23/2023   Cerebral vascular disease 02/23/2023   Hypercalcemia 02/05/2023   Right lumbar radiculopathy 02/03/2023   Solitary pulmonary nodule 02/03/2023   Occult blood in stools 01/18/2023   AVM (arteriovenous malformation) of small bowel, acquired 01/18/2023   Incidental pulmonary nodule 01/15/2023   Chronic heart failure with preserved ejection fraction (HFpEF) (HCC) 01/15/2023   Bilateral hearing loss 11/11/2022   Left ear hearing loss 12/27/2021   Vitamin D  deficiency 11/02/2021   Carpal tunnel syndrome of right wrist 07/14/2021   Gastritis 12/01/2020   Vertigo 12/01/2020   Hemorrhoid 12/01/2020   Chronic respiratory failure with hypoxia (HCC) 10/18/2020   Anxiety 05/16/2020   Chronic obstructive pulmonary disease (HCC) 02/17/2020   Pain and swelling of right knee 10/15/2019   Pain in left knee 11/10/2017   Prediabetes 10/03/2017   Congestive heart failure (CHF) (HCC) 10/05/2016   Elevated total protein 05/31/2016   Tinnitus 05/05/2015   Overactive bladder 11/12/2012   OA (osteoarthritis) of hip 11/06/2012   Dysphagia 10/23/2012   Osteoporosis 05/15/2012   DJD (degenerative joint disease) of knee 02/15/2012   Olecranon bursitis of right elbow 11/23/2011   Cervical radiculopathy 09/30/2011   Gait abnormality 09/04/2011   HIATAL HERNIA 03/01/2010   Chronic pain syndrome 02/16/2009   Iron  deficiency anemia 01/14/2009   Insomnia 11/05/2008   OVERACTIVE BLADDER 04/14/2008   Dyslipidemia, goal LDL below 130 04/18/2007   Morbid obesity (HCC) 12/09/2006   Essential hypertension 12/09/2006   COPD with chronic bronchitis and emphysema (HCC)  12/09/2006   GERD 12/09/2006   Osteoarthritis 12/09/2006   Home Medication(s) Prior to Admission medications   Medication Sig Start Date End Date Taking? Authorizing Provider  amoxicillin -clavulanate (AUGMENTIN ) 875-125 MG tablet Take 1 tablet by mouth every 12 (twelve) hours. 11/25/23  Yes  Mannie Pac T, DO  acetaminophen  (TYLENOL ) 325 MG tablet Take 2 tablets (650 mg total) by mouth every 6 (six) hours as needed for mild pain (or Fever >/= 101). 02/19/14   Perkins, Alexzandrew L, PA-C  albuterol  (VENTOLIN  HFA) 108 (90 Base) MCG/ACT inhaler Inhale 2 puffs into the lungs every 4 (four) hours as needed for wheezing or shortness of breath. 05/01/23   [name], MD  ALPRAZolam  (XANAX ) 0.25 MG tablet Take 1 tablet (0.25 mg total) by mouth 2 (two) times daily as needed. 11/05/23   [name], MD  amLODipine  (NORVASC ) 10 MG tablet TAKE 1 TABLET BY MOUTH EVERY DAY 09/17/23   [name], MD  aspirin  EC 81 MG tablet Take 1 tablet (81 mg total) by mouth daily. Swallow whole. 01/22/23   Tobie Yetta HERO, MD  Budeson-Glycopyrrol-Formoterol  (BREZTRI  AEROSPHERE) 160-9-4.8 MCG/ACT AERO Inhale 2 puffs into the lungs in the morning and at bedtime. 05/11/23 05/10/24  Mannam, Praveen, MD  cloNIDine  (CATAPRES ) 0.2 MG tablet TAKE 1 TABLET BY MOUTH TWICE  DAILY 10/25/23   [name], MD  Fexofenadine HCl (MUCINEX  ALLERGY PO) Take 1 tablet by mouth as needed (congestion).    [provider]  Fluticasone-Umeclidin-Vilant (TRELEGY ELLIPTA ) 200-62.5-25 MCG/ACT AEPB 2 samples 10/08/23   Mannam, Praveen, MD  Fluticasone-Umeclidin-Vilant (TRELEGY ELLIPTA ) 200-62.5-25 MCG/ACT AEPB Inhale 1 Inhalation into the lungs daily. 10/15/23   Mannam, Praveen, MD  furosemide  (LASIX ) 40 MG tablet Take 1 tablet (40 mg total) by mouth daily. 02/28/23 11/21/23  Tobb, Kardie, DO  gabapentin  (NEURONTIN ) 600 MG tablet Take 1 tablet (600 mg total) by mouth 3 (three) times daily. 11/05/23   [name], MD  guaiFENesin  (MUCINEX ) 600 MG 12 hr tablet Take 1 tablet (600 mg total) by mouth 2 (two) times daily. 01/19/23   Tobie Yetta HERO, MD  hydrALAZINE  (APRESOLINE ) 100 MG tablet TAKE 1 TABLET BY MOUTH 3 TIMES  DAILY 10/29/23   [name], MD  HYDROcodone -acetaminophen  (NORCO/VICODIN) 5-325 MG tablet Take 1 tablet by mouth  every 6 (six) hours as needed. 10/25/23   [name], MD  iron  polysaccharides (NIFEREX) 150 MG capsule TAKE 1 CAPSULE BY MOUTH EVERY DAY 11/12/23   [name], MD  lovastatin  (MEVACOR ) 40 MG tablet TAKE 1 TABLET BY MOUTH AT  BEDTIME 03/19/23   [name], MD  meclizine  (ANTIVERT ) 12.5 MG tablet TAKE 1 TABLET BY MOUTH THREE TIMES A DAY AS NEEDED FOR DIZZINESS 06/04/23   [name], MD  nystatin  (MYCOSTATIN ) 100000 UNIT/ML suspension Swish and spit 5mL two times daily after using Symbicort . 11/09/22   [name], MD  pantoprazole  (PROTONIX ) 40 MG tablet TAKE 1 TABLET BY MOUTH DAILY 11/15/23   [name], MD  polyethylene glycol (MIRALAX  / GLYCOLAX ) 17 g packet Take 17 g by mouth daily as needed for mild constipation or moderate constipation.    [provider]  potassium chloride  SA (KLOR-CON  M20) 20 MEQ tablet Take 1 tablet (20 mEq total) by mouth daily. 02/28/23 11/21/23  Tobb, Kardie, DO  predniSONE  (DELTASONE ) 10 MG tablet 3 tabs by mouth per day for 3 days,2tabs per day for 3 days,1tab per day for 3 days 10/25/23  [name], MD  solifenacin  (VESICARE ) 10 MG tablet TAKE 1 TABLET BY MOUTH EVERY DAY 10/29/23   [name], MD  tiZANidine  (ZANAFLEX ) 4 MG tablet TAKE 1 TABLET BY MOUTH EVERY 6 HOURS AS NEEDED FOR MUSCLE SPASMS. 10/18/23   [name], MD  traMADol  (ULTRAM ) 50 MG tablet Take 1 tablet (50 mg total) by mouth every 6 (six) hours as needed. for pain 11/05/23   [name], MD  traZODone  (DESYREL ) 50 MG tablet TAKE 1/2 TO 1 TABLET BY MOUTH AT BEDTIME AS NEEDED FOR SLEEP 11/05/23   [name], MD  triamcinolone  cream (KENALOG ) 0.1 % Apply topically 2 (two) times daily. 11/05/23   [name], MD  valACYclovir  (VALTREX ) 1000 MG tablet Take 1 tab by mouth three times per day 09/28/23   [name], MD  valsartan  (DIOVAN ) 320 MG tablet Take 1 tablet (320 mg total) by mouth daily. 07/23/23   Janene Boer, PA                                                                                                                                     Past Surgical History Past Surgical History:  Procedure Laterality Date   BIOPSY  01/18/2023   Procedure: BIOPSY;  Surgeon: Aneita Gwendlyn DASEN, MD;  Location: [facility];  Service: Gastroenterology;;   COLONOSCOPY     ESOPHAGOGASTRODUODENOSCOPY (EGD) WITH PROPOFOL  N/A 01/18/2023   Procedure: ESOPHAGOGASTRODUODENOSCOPY (EGD) WITH PROPOFOL ;  Surgeon: Aneita Gwendlyn DASEN, MD;  Location: [facility];  Service: Gastroenterology;  Laterality: N/A;   HOT HEMOSTASIS N/A 01/18/2023   Procedure: HOT HEMOSTASIS (ARGON PLASMA COAGULATION/BICAP);  Surgeon: Aneita Gwendlyn DASEN, MD;  Location: [facility];  Service: Gastroenterology;  Laterality: N/A;   LUMBAR DISC SURGERY     TONSILLECTOMY     as a child   TOTAL HIP ARTHROPLASTY Right 11/06/2012   Procedure: RIGHT TOTAL HIP ARTHROPLASTY WITH ACETABULAR AUTOGRAFT;  Surgeon: Dempsey LULLA Moan, MD;  Location: [facility];  Service: Orthopedics;  Laterality: Right;   TOTAL HIP ARTHROPLASTY Left 02/18/2014   Procedure: LEFT TOTAL HIP ARTHROPLASTY;  Surgeon: Dempsey Moan LULLA, MD;  Location: [facility];  Service: Orthopedics;  Laterality: Left;   TUBAL LIGATION     Family History Family History  Problem Relation Age of Onset   Heart disease Mother        Had pacemaker   Lung cancer Father    Hypertension Brother    Diabetes Neg Hx    Colon cancer Neg Hx    Colon polyps Neg Hx    Kidney disease Neg Hx    Gallbladder disease Neg Hx    Esophageal cancer Neg Hx    Allergic rhinitis Neg Hx    Angioedema Neg Hx    Asthma Neg Hx    Atopy Neg Hx    Eczema Neg Hx    Immunodeficiency Neg Hx  Urticaria Neg Hx     Social History Social History   Tobacco Use   Smoking status: Former    Current packs/day: 0.00    Average packs/day: 0.5 packs/day for 25.0 years (12.5 ttl pk-yrs)    Types: Cigarettes    Start date: 10/30/1947    Quit date: 10/29/1972    Years since quitting: 51.1   Smokeless tobacco: Never   Vaping Use   Vaping status: Never Used  Substance Use Topics   Alcohol  use: No    Alcohol /week: 0.0 standard drinks of alcohol    Drug use: No   Allergies Chlorthalidone and Benazepril   Review of Systems Review of Systems  Physical Exam Vital Signs  I have reviewed the triage vital signs BP (!) 184/62   Pulse 78   Temp 99.1 F (37.3 C) (Oral)   Resp 18   SpO2 93%   Physical Exam Vitals and nursing note reviewed.  HENT:     Head: Normocephalic and atraumatic.  Eyes:     Extraocular Movements: Extraocular movements intact.  Cardiovascular:     Rate and Rhythm: Normal rate.  Pulmonary:     Effort: Pulmonary effort is normal. No respiratory distress.     Breath sounds: No wheezing.  Abdominal:     General: There is no distension.     Palpations: Abdomen is soft.     Tenderness: There is no abdominal tenderness.  Musculoskeletal:        General: Normal range of motion.     Cervical back: Normal range of motion.  Skin:    General: Skin is warm.  Neurological:     Mental Status: She is alert.     Cranial Nerves: No cranial nerve deficit or facial asymmetry.     Sensory: No sensory deficit.     Coordination: Romberg sign negative.     ED Results and Treatments Labs (all labs ordered are listed, but only abnormal results are displayed) Labs Reviewed  BASIC METABOLIC PANEL WITH GFR - Abnormal; Notable for the following components:      Result Value   Chloride 97 (*)    Creatinine, Ser 1.06 (*)    GFR, Estimated 52 (*)    All other components within normal limits  CBC - Abnormal; Notable for the following components:   RBC 3.44 (*)    Hemoglobin 9.3 (*)    HCT 30.5 (*)    All other components within normal limits  URINALYSIS, W/ REFLEX TO CULTURE (INFECTION SUSPECTED) - Abnormal; Notable for the following components:   Color, Urine STRAW (*)    Bacteria, UA RARE (*)    All other components within normal limits  BLOOD GAS, VENOUS - Abnormal; Notable for the  following components:   pH, Ven 7.44 (*)    pO2, Ven 51 (*)    Bicarbonate 34.0 (*)    Acid-Base Excess 8.3 (*)    All other components within normal limits  BRAIN NATRIURETIC PEPTIDE  Radiology CT Head Wo Contrast Result Date: 11/25/2023 CLINICAL DATA:  Ataxia, head trauma; Ataxia, cervical trauma. Headaches. EXAM: CT HEAD WITHOUT CONTRAST CT CERVICAL SPINE WITHOUT CONTRAST TECHNIQUE: Multidetector CT imaging of the head and cervical spine was performed following the standard protocol without intravenous contrast. Multiplanar CT image reconstructions of the cervical spine were also generated. RADIATION DOSE REDUCTION: This exam was performed according to the departmental dose-optimization program which includes automated exposure control, adjustment of the mA and/or kV according to patient size and/or use of iterative reconstruction technique. COMPARISON:  MRI head and cervical spine 03/16/2023. CT head 12/19/2022. FINDINGS: CT HEAD FINDINGS Brain: There is no evidence of an acute infarct, intracranial hemorrhage, mass, midline shift, or extra-axial fluid collection. There is mild-to-moderate cerebral atrophy. Confluent hypodensities in the cerebral white matter are similar to the prior CT and are nonspecific but compatible with severe chronic small vessel ischemic disease. Vascular: No hyperdense vessel. Skull: No acute fracture or suspicious lesion. Sinuses/Orbits: New near complete opacification of the left maxillary sinus with an air-fluid level, 1.8 cm focus coarsely calcified material in the inferior aspect of the sinus, and associated osteitis. New near complete opacification of the anterior and mid ethmoid air cells on the left, and partial opacification of the left frontal sinus with an air-fluid level. Clear mastoid air cells. Bilateral cataract extraction. Other: None. CT  CERVICAL SPINE FINDINGS Alignment: Cervical spine straightening.  No listhesis. Skull base and vertebrae: No acute fracture or suspicious lesion. Soft tissues and spinal canal: No prevertebral fluid or swelling. No visible canal hematoma. Disc levels: Diffuse disc degeneration, most advanced at C3-4, C5-6, and C6-7. At least moderate multilevel spinal and neural foraminal stenosis, more fully evaluated on the prior MRI. Upper chest: Emphysema. Other: None. IMPRESSION: 1. No evidence of acute intracranial abnormality or cervical spine fracture. 2. Severe chronic small vessel ischemic disease. 3. New left-sided sinusitis with ostiomeatal unit obstruction. Electronically Signed   By: Dasie Hamburg M.D.   On: 11/25/2023 11:26   CT Cervical Spine Wo Contrast Result Date: 11/25/2023 CLINICAL DATA:  Ataxia, head trauma; Ataxia, cervical trauma. Headaches. EXAM: CT HEAD WITHOUT CONTRAST CT CERVICAL SPINE WITHOUT CONTRAST TECHNIQUE: Multidetector CT imaging of the head and cervical spine was performed following the standard protocol without intravenous contrast. Multiplanar CT image reconstructions of the cervical spine were also generated. RADIATION DOSE REDUCTION: This exam was performed according to the departmental dose-optimization program which includes automated exposure control, adjustment of the mA and/or kV according to patient size and/or use of iterative reconstruction technique. COMPARISON:  MRI head and cervical spine 03/16/2023. CT head 12/19/2022. FINDINGS: CT HEAD FINDINGS Brain: There is no evidence of an acute infarct, intracranial hemorrhage, mass, midline shift, or extra-axial fluid collection. There is mild-to-moderate cerebral atrophy. Confluent hypodensities in the cerebral white matter are similar to the prior CT and are nonspecific but compatible with severe chronic small vessel ischemic disease. Vascular: No hyperdense vessel. Skull: No acute fracture or suspicious lesion. Sinuses/Orbits: New  near complete opacification of the left maxillary sinus with an air-fluid level, 1.8 cm focus coarsely calcified material in the inferior aspect of the sinus, and associated osteitis. New near complete opacification of the anterior and mid ethmoid air cells on the left, and partial opacification of the left frontal sinus with an air-fluid level. Clear mastoid air cells. Bilateral cataract extraction. Other: None. CT CERVICAL SPINE FINDINGS Alignment: Cervical spine straightening.  No listhesis. Skull base and vertebrae: No acute fracture or suspicious lesion. Soft tissues  and spinal canal: No prevertebral fluid or swelling. No visible canal hematoma. Disc levels: Diffuse disc degeneration, most advanced at C3-4, C5-6, and C6-7. At least moderate multilevel spinal and neural foraminal stenosis, more fully evaluated on the prior MRI. Upper chest: Emphysema. Other: None. IMPRESSION: 1. No evidence of acute intracranial abnormality or cervical spine fracture. 2. Severe chronic small vessel ischemic disease. 3. New left-sided sinusitis with ostiomeatal unit obstruction. Electronically Signed   By: Dasie Hamburg M.D.   On: 11/25/2023 11:26   DG Chest 1 View Result Date: 11/25/2023 CLINICAL DATA:  Cough. EXAM: CHEST  1 VIEW COMPARISON:  Chest radiograph dated 09/13/2023. FINDINGS: Diffuse chronic visual coarsening. No focal consolidation, pleural effusion, or pneumothorax. Stable cardiac silhouette no acute osseous pathology. IMPRESSION: No active disease. Electronically Signed   By: Vanetta Chou M.D.   On: 11/25/2023 10:48    Pertinent labs & imaging results that were available during my care of the patient were reviewed by me and considered in my medical decision making (see MDM for details).  Medications Ordered in ED Medications  amoxicillin -clavulanate (AUGMENTIN ) 875-125 MG per tablet 1 tablet (has no administration in time range)  acetaminophen  (TYLENOL ) tablet 1,000 mg (1,000 mg Oral Given 11/25/23  1015)  cloNIDine  (CATAPRES ) tablet 0.2 mg (0.2 mg Oral Given 11/25/23 1209)  metoCLOPramide  (REGLAN ) injection 10 mg (10 mg Intravenous Given 11/25/23 1209)                                                                                                                                     Procedures Procedures  (including critical care time)  Medical Decision Making / ED Course   This patient presents to the ED for concern of headache, this involves an extensive number of treatment options, and is a complaint that carries with it a high risk of complications and morbidity.  The differential diagnosis includes intracranial hemorrhage, tension type headache, consider normal pressure hydrocephalus, COPD, heart failure exacerbation.  MDM: Patient chronically on 4 L, my independent review the patient's chest x-ray does show some mild pulmonary edema.  Will check a BNP and VBG on the patient.  Regarding her headache, she has no neurological deficits.  Will obtain CT imaging given the recent falls.  I have lower suspicion for normal pressure hydrocephalus, but certainly on the differential given the falls.  Patient without any urinary incontinence.  Is behaving normally.  Reassessment 1:35 PM-CT head negative aside from sinusitis, patient is having some pain on that left side.  May be contributing to her symptoms.  Blood pressures come down nicely with her home clonidine .  Urine negative, BNP not significant elevated, venous gas normal.     Additional history obtained: -Additional history obtained from family at bedside -External records from outside source obtained and reviewed including: Chart review including previous notes, labs, imaging, consultation notes   Lab Tests: -I ordered, reviewed, and interpreted labs.  The pertinent results include:   Labs Reviewed  BASIC METABOLIC PANEL WITH GFR - Abnormal; Notable for the following components:      Result Value   Chloride 97 (*)     Creatinine, Ser 1.06 (*)    GFR, Estimated 52 (*)    All other components within normal limits  CBC - Abnormal; Notable for the following components:   RBC 3.44 (*)    Hemoglobin 9.3 (*)    HCT 30.5 (*)    All other components within normal limits  URINALYSIS, W/ REFLEX TO CULTURE (INFECTION SUSPECTED) - Abnormal; Notable for the following components:   Color, Urine STRAW (*)    Bacteria, UA RARE (*)    All other components within normal limits  BLOOD GAS, VENOUS - Abnormal; Notable for the following components:   pH, Ven 7.44 (*)    pO2, Ven 51 (*)    Bicarbonate 34.0 (*)    Acid-Base Excess 8.3 (*)    All other components within normal limits  BRAIN NATRIURETIC PEPTIDE        Imaging Studies ordered: I ordered imaging studies including CT head, cervical spine, chest x-ray I independently visualized and interpreted imaging. I agree with the radiologist interpretation   Medicines ordered and prescription drug management: Meds ordered this encounter  Medications   acetaminophen  (TYLENOL ) tablet 1,000 mg   cloNIDine  (CATAPRES ) tablet 0.2 mg   metoCLOPramide  (REGLAN ) injection 10 mg   amoxicillin -clavulanate (AUGMENTIN ) 875-125 MG per tablet 1 tablet   amoxicillin -clavulanate (AUGMENTIN ) 875-125 MG tablet    Sig: Take 1 tablet by mouth every 12 (twelve) hours.    Dispense:  14 tablet    Refill:  0    -I have reviewed the patients home medicines and have made adjustments as needed   Cardiac Monitoring: The patient was maintained on a cardiac monitor.  I personally viewed and interpreted the cardiac monitored which showed an underlying rhythm of: Normal sinus rhythm  Social Determinants of Health:  Factors impacting patients care include: Multiple medical comorbidities including   Reevaluation: After the interventions noted above, I reevaluated the patient and found that they have :improved  Co morbidities that complicate the patient evaluation  Past Medical  History:  Diagnosis Date   ALLERGIC RHINITIS 12/09/2006   ANEMIA 01/14/2009   Anxiety    Arthritis    ASTHMA 12/09/2006   Cataract    bilateral -removed   CHF (congestive heart failure) (HCC)    Chronic pain syndrome 02/16/2009   COPD 12/09/2006   DEGENERATIVE JOINT DISEASE 12/09/2006   DIZZINESS 03/30/2010   DYSPNEA 01/27/2010   with heavy exertion   FREQUENCY, URINARY 03/30/2010   GERD 12/09/2006   HELICOBACTER PYLORI GASTRITIS, HX OF 12/09/2006   HIATAL HERNIA 03/01/2010   HYPERLIPIDEMIA 04/18/2007   HYPERSOMNIA 08/31/2008   HYPERTENSION 12/09/2006   INSOMNIA-SLEEP DISORDER-UNSPEC 11/05/2008   LOW BACK PAIN 12/09/2006   Morbid obesity (HCC) 12/09/2006   Nonspecific (abnormal) findings on radiological and other examination of body structure 11/05/2008   OSTEOPOROSIS 12/09/2006   OVERACTIVE BLADDER 04/14/2008   PALPITATIONS, RECURRENT 01/27/2010   Stroke (HCC)    pt. stated light one years ago   SYNCOPE 11/05/2008   TRANSIENT ISCHEMIC ATTACK, HX OF 12/09/2006      Dispostion: I considered admission for this patient, however she prefers to go home.  She is appropriate for discharge.  While here in the emerged     Final Clinical Impression(s) / ED Diagnoses Final diagnoses:  Acute non-recurrent  frontal sinusitis     @PCDICTATION @    Mannie Pac T, DO 11/25/23 1403

## 2023-11-27 ENCOUNTER — Encounter: Payer: Self-pay | Admitting: Cardiology

## 2023-11-28 NOTE — Telephone Encounter (Signed)
 Spoke with pt's daughter re message and for a few days has noted increased heart rate Pt's daughter called mom and pt  has been using rescue inhaler every am for a few days daughter feels may be using due to sinus infection Pt was recently diagnosed with sinus infection and  is on Antibiotic therapy Informed elevated heart rate may be related to infection or the use of rescue inhaler need to continue to monitor and call back if no  improvement Pt also c/o diarrhea since starting Augmentin  Will forward to Dr Sheena for review and recommendations ./cy

## 2023-11-28 NOTE — Telephone Encounter (Signed)
 Lm for daughter to call back and to schedule appt with APP per Dr Ginette recommendations .lorrayne

## 2023-12-04 ENCOUNTER — Other Ambulatory Visit: Payer: Self-pay | Admitting: Cardiology

## 2023-12-06 ENCOUNTER — Encounter: Payer: Self-pay | Admitting: Internal Medicine

## 2023-12-07 NOTE — Telephone Encounter (Signed)
 I am not aware of how this can be done, so maybe ask a home health representative?  O/w not sure who to ask

## 2023-12-11 ENCOUNTER — Ambulatory Visit: Attending: Nurse Practitioner | Admitting: Nurse Practitioner

## 2023-12-11 ENCOUNTER — Encounter: Payer: Self-pay | Admitting: Nurse Practitioner

## 2023-12-11 VITALS — BP 130/64 | HR 60 | Ht 64.0 in | Wt 195.0 lb

## 2023-12-11 DIAGNOSIS — I5032 Chronic diastolic (congestive) heart failure: Secondary | ICD-10-CM

## 2023-12-11 DIAGNOSIS — I10 Essential (primary) hypertension: Secondary | ICD-10-CM

## 2023-12-11 DIAGNOSIS — R002 Palpitations: Secondary | ICD-10-CM | POA: Diagnosis not present

## 2023-12-11 DIAGNOSIS — D649 Anemia, unspecified: Secondary | ICD-10-CM | POA: Diagnosis not present

## 2023-12-11 DIAGNOSIS — E782 Mixed hyperlipidemia: Secondary | ICD-10-CM

## 2023-12-11 DIAGNOSIS — J449 Chronic obstructive pulmonary disease, unspecified: Secondary | ICD-10-CM | POA: Diagnosis not present

## 2023-12-11 DIAGNOSIS — I272 Pulmonary hypertension, unspecified: Secondary | ICD-10-CM | POA: Diagnosis not present

## 2023-12-11 NOTE — Progress Notes (Signed)
 Office Visit    Patient Name: Wendy Torres Date of Encounter: 12/11/2023  Primary Care Provider:  [name], MD Primary Cardiologist:  Kardie Tobb, DO  Chief Complaint    83 year old female with a history of chronic diastolic heart failure, pulmonary hypertension, hypertension, hyperlipidemia, COPD, chronic hypoxic respiratory failure on home O2, TIA, anemia, arthritis, and GERD who presents for follow-up related to palpitations.  Past Medical History    Past Medical History:  Diagnosis Date   ALLERGIC RHINITIS 12/09/2006   ANEMIA 01/14/2009   Anxiety    Arthritis    ASTHMA 12/09/2006   Cataract    bilateral -removed   CHF (congestive heart failure) (HCC)    Chronic pain syndrome 02/16/2009   COPD 12/09/2006   DEGENERATIVE JOINT DISEASE 12/09/2006   DIZZINESS 03/30/2010   DYSPNEA 01/27/2010   with heavy exertion   FREQUENCY, URINARY 03/30/2010   GERD 12/09/2006   HELICOBACTER PYLORI GASTRITIS, HX OF 12/09/2006   HIATAL HERNIA 03/01/2010   HYPERLIPIDEMIA 04/18/2007   HYPERSOMNIA 08/31/2008   HYPERTENSION 12/09/2006   INSOMNIA-SLEEP DISORDER-UNSPEC 11/05/2008   LOW BACK PAIN 12/09/2006   Morbid obesity (HCC) 12/09/2006   Nonspecific (abnormal) findings on radiological and other examination of body structure 11/05/2008   OSTEOPOROSIS 12/09/2006   OVERACTIVE BLADDER 04/14/2008   PALPITATIONS, RECURRENT 01/27/2010   Stroke (HCC)    pt. stated light one years ago   SYNCOPE 11/05/2008   TRANSIENT ISCHEMIC ATTACK, HX OF 12/09/2006   Past Surgical History:  Procedure Laterality Date   BIOPSY  01/18/2023   Procedure: BIOPSY;  Surgeon: Aneita Gwendlyn DASEN, MD;  Location: [facility];  Service: Gastroenterology;;   COLONOSCOPY     ESOPHAGOGASTRODUODENOSCOPY (EGD) WITH PROPOFOL  N/A 01/18/2023   Procedure: ESOPHAGOGASTRODUODENOSCOPY (EGD) WITH PROPOFOL ;  Surgeon: Aneita Gwendlyn DASEN, MD;  Location: [facility];  Service: Gastroenterology;  Laterality: N/A;   HOT  HEMOSTASIS N/A 01/18/2023   Procedure: HOT HEMOSTASIS (ARGON PLASMA COAGULATION/BICAP);  Surgeon: Aneita Gwendlyn DASEN, MD;  Location: [facility];  Service: Gastroenterology;  Laterality: N/A;   LUMBAR DISC SURGERY     TONSILLECTOMY     as a child   TOTAL HIP ARTHROPLASTY Right 11/06/2012   Procedure: RIGHT TOTAL HIP ARTHROPLASTY WITH ACETABULAR AUTOGRAFT;  Surgeon: Dempsey LULLA Moan, MD;  Location: [facility];  Service: Orthopedics;  Laterality: Right;   TOTAL HIP ARTHROPLASTY Left 02/18/2014   Procedure: LEFT TOTAL HIP ARTHROPLASTY;  Surgeon: Dempsey Moan LULLA, MD;  Location: [facility];  Service: Orthopedics;  Laterality: Left;   TUBAL LIGATION      Allergies  Allergies  Allergen Reactions   Chlorthalidone Shortness Of Breath   Benazepril  Swelling     Labs/Other Studies Reviewed    The following studies were reviewed today:  Cardiac Studies & Procedures   ______________________________________________________________________________________________     ECHOCARDIOGRAM  ECHOCARDIOGRAM COMPLETE 04/06/2023  Narrative ECHOCARDIOGRAM REPORT    Patient Name:   Wendy Torres Date of Exam: 04/06/2023 Medical Rec #:  [id number]      Height:       64.0 in Accession #:    [id number]     Weight:       205.4 lb Date of Birth:  [birth year]      BSA:          1.979 m Patient Age:    82 years       BP:           144/64 mmHg Patient Gender: F  HR:           74 bpm. Exam Location:  [address]  Procedure: 2D Echo, Cardiac Doppler and Color Doppler  Indications:    I50.32 Chronic Diastolic Heart Failure  History:        Patient has prior history of Echocardiogram examinations, most recent 01/04/2021. Stroke and COPD, Signs/Symptoms:Syncope and Dyspnea; Risk Factors:Hypertension and Dyslipidemia.  Sonographer:    Carl Coma RDCS Referring Phys: [id number] KARDIE TOBB  IMPRESSIONS   1. Left ventricular ejection fraction, by estimation, is 70 to 75%. The left ventricle has  hyperdynamic function. The left ventricle has no regional wall motion abnormalities. There is moderate asymmetric left ventricular hypertrophy of the basal-septal segment. Left ventricular diastolic parameters are consistent with Grade II diastolic dysfunction (pseudonormalization). Elevated left atrial pressure. 2. Right ventricular systolic function is normal. The right ventricular size is normal. There is moderately elevated pulmonary artery systolic pressure. The estimated right ventricular systolic pressure is 59.6 mmHg. 3. The mitral valve is normal in structure. Trivial mitral valve regurgitation. No evidence of mitral stenosis. 4. The aortic valve was not well visualized. Aortic valve regurgitation is not visualized. Aortic valve sclerosis is present, with no evidence of aortic valve stenosis. 5. The inferior vena cava is normal in size with greater than 50% respiratory variability, suggesting right atrial pressure of 3 mmHg.  FINDINGS Left Ventricle: Left ventricular ejection fraction, by estimation, is 70 to 75%. The left ventricle has hyperdynamic function. The left ventricle has no regional wall motion abnormalities. The left ventricular internal cavity size was normal in size. There is moderate asymmetric left ventricular hypertrophy of the basal-septal segment. Left ventricular diastolic parameters are consistent with Grade II diastolic dysfunction (pseudonormalization). Elevated left atrial pressure.  Right Ventricle: The right ventricular size is normal. No increase in right ventricular wall thickness. Right ventricular systolic function is normal. There is moderately elevated pulmonary artery systolic pressure. The tricuspid regurgitant velocity is 3.76 m/s, and with an assumed right atrial pressure of 3 mmHg, the estimated right ventricular systolic pressure is 59.6 mmHg.  Left Atrium: Left atrial size was normal in size.  Right Atrium: Right atrial size was normal in  size.  Pericardium: There is no evidence of pericardial effusion.  Mitral Valve: The mitral valve is normal in structure. Trivial mitral valve regurgitation. No evidence of mitral valve stenosis.  Tricuspid Valve: The tricuspid valve is normal in structure. Tricuspid valve regurgitation is mild.  The aortic valve was not well visualized. Aortic valve regurgitation is not visualized. Aortic valve sclerosis is present, with no evidence of aortic valve stenosis. Pulmonic Valve: The pulmonic valve was not well visualized. Pulmonic valve regurgitation is trivial.  Aorta: The aortic root is normal in size and structure.  Venous: The inferior vena cava is normal in size with greater than 50% respiratory variability, suggesting right atrial pressure of 3 mmHg.  IAS/Shunts: The interatrial septum was not well visualized.   LEFT VENTRICLE PLAX 2D LVIDd:         4.70 cm   Diastology LVIDs:         2.30 cm   LV e' medial:    6.04 cm/s LV PW:         1.00 cm   LV E/e' medial:  16.4 LV IVS:        1.00 cm   LV e' lateral:   8.98 cm/s LVOT diam:     2.20 cm   LV E/e' lateral: 11.0 LV SV:  122 LV SV Index:   62 LVOT Area:     3.80 cm   RIGHT VENTRICLE             IVC RV Basal diam:  4.20 cm     IVC diam: 1.40 cm RV S prime:     21.20 cm/s TAPSE (M-mode): 2.6 cm  LEFT ATRIUM             Index        RIGHT ATRIUM           Index LA diam:        5.20 cm 2.63 cm/m   RA Area:     14.50 cm LA Vol (A2C):   64.1 ml 32.40 ml/m  RA Volume:   34.20 ml  17.28 ml/m LA Vol (A4C):   49.7 ml 25.12 ml/m LA Biplane Vol: 56.4 ml 28.50 ml/m AORTIC VALVE AV Area (Vmax):    2.82 cm AV Area (Vmean):   2.78 cm AV Area (VTI):     2.82 cm AV Vmax:           192.20 cm/s AV Vmean:          132.800 cm/s AV VTI:            0.434 m AV Peak Grad:      14.8 mmHg AV Mean Grad:      8.0 mmHg LVOT Vmax:         142.50 cm/s LVOT Vmean:        97.000 cm/s LVOT VTI:          0.322 m LVOT/AV VTI ratio:  0.74  AORTA Ao Root diam: 3.20 cm  MITRAL VALVE                TRICUSPID VALVE MV Area (PHT): 3.45 cm     TR Peak grad:   56.6 mmHg MV Decel Time: 220 msec     TR Vmax:        376.00 cm/s MV E velocity: 99.00 cm/s MV A velocity: 113.00 cm/s  SHUNTS MV E/A ratio:  0.88         Systemic VTI:  0.32 m Systemic Diam: 2.20 cm  Lonni Nanas MD Electronically signed by Lonni Nanas MD Signature Date/Time: 04/06/2023/6:03:22 PM    Final          ______________________________________________________________________________________________     Recent Labs: 02/28/2023: Magnesium 1.9; NT-Pro BNP 109 11/21/2023: ALT 9; TSH 1.48 11/25/2023: B Natriuretic Peptide 27.3; BUN 13; Creatinine, Ser 1.06; Hemoglobin 9.3; Platelets 287; Potassium 3.9; Sodium 135  Recent Lipid Panel    Component Value Date/Time   CHOL 158 11/21/2023 1444   TRIG 67.0 11/21/2023 1444   HDL 66.30 11/21/2023 1444   CHOLHDL 2 11/21/2023 1444   VLDL 13.4 11/21/2023 1444   LDLCALC 79 11/21/2023 1444    History of Present Illness   83 year old female with the above past medical history including  chronic diastolic heart failure, pulmonary hypertension, hypertension, hyperlipidemia, COPD, chronic hypoxic respiratory failure on home O2, TIA, anemia, arthritis, and GERD.  She has a history of chronic diastolic heart failure, pulmonary hypertension, on Lasix .  Most recent echocardiogram in 04/2023 showed EF 70 to 75%, hyperdynamic LV function, no RWMA, moderate asymmetric LVH of the basal septal segment, G2 DD, elevated left atrial pressure, normal RV systolic function, moderately elevated PASP, no significant valvular abnormalities.  She has a history of iron  deficiency anemia, followed by hematology oncology.  She was also  noted to have intermittent hypercalcemia.  Kappa free light chain was elevated at 42.4. SPEP showed no evidence of M spikes. There was no evidence of monoclonal gammopathy.  She was last  seen in the office on 07/23/2023 and was stable from a cardiac standpoint.  BP was elevated.  Follow-up with hypertension clinic was recommended, but not completed.  She was seen in the ED on 11/25/2023 in the setting of headache, hypertension. BP improved with clonidine , ED workup was otherwise unremarkable. She contacted our office on 11/28/2023 with concern for elevated heart rate, in the setting of sinus infection, shingles infection.  She presents today for follow-up accompanied by her daughter. Since her last visit she has been stable from a cardiac standpoint.  She reports stable dyspnea on exertion, stable nonpitting bilateral lower extremity edema.  She denies chest pain. She reports a sensation of her heart racing at night when she gets up to go to the bathroom. She typically has assistance when she goes to the bathroom during the day. She uses a bedside commode at night, which causes her some anxiety. She denies any other palpitations, denies dizziness, presyncope or syncope.    Home Medications    Current Outpatient Medications  Medication Sig Dispense Refill   acetaminophen  (TYLENOL ) 325 MG tablet Take 2 tablets (650 mg total) by mouth every 6 (six) hours as needed for mild pain (or Fever >/= 101). 40 tablet 0   albuterol  (VENTOLIN  HFA) 108 (90 Base) MCG/ACT inhaler Inhale 2 puffs into the lungs every 4 (four) hours as needed for wheezing or shortness of breath. 8.5 each 1   ALPRAZolam  (XANAX ) 0.25 MG tablet Take 1 tablet (0.25 mg total) by mouth 2 (two) times daily as needed. 60 tablet 2   amLODipine  (NORVASC ) 10 MG tablet TAKE 1 TABLET BY MOUTH EVERY DAY 90 tablet 3   aspirin  EC 81 MG tablet Take 1 tablet (81 mg total) by mouth daily. Swallow whole.     Budeson-Glycopyrrol-Formoterol  (BREZTRI  AEROSPHERE) 160-9-4.8 MCG/ACT AERO Inhale 2 puffs into the lungs in the morning and at bedtime. 3 each 3   cloNIDine  (CATAPRES ) 0.2 MG tablet TAKE 1 TABLET BY MOUTH TWICE  DAILY 180 tablet 1    Fexofenadine HCl (MUCINEX  ALLERGY PO) Take 1 tablet by mouth as needed (congestion).     Fluticasone-Umeclidin-Vilant (TRELEGY ELLIPTA ) 200-62.5-25 MCG/ACT AEPB Inhale 1 Inhalation into the lungs daily.     furosemide  (LASIX ) 40 MG tablet Take 1 tablet (40 mg total) by mouth daily. 90 tablet 3   gabapentin  (NEURONTIN ) 600 MG tablet Take 1 tablet (600 mg total) by mouth 3 (three) times daily. 270 tablet 1   guaiFENesin  (MUCINEX ) 600 MG 12 hr tablet Take 1 tablet (600 mg total) by mouth 2 (two) times daily. 30 tablet 0   hydrALAZINE  (APRESOLINE ) 100 MG tablet TAKE 1 TABLET BY MOUTH 3 TIMES  DAILY 270 tablet 2   HYDROcodone -acetaminophen  (NORCO/VICODIN) 5-325 MG tablet Take 1 tablet by mouth every 6 (six) hours as needed. 30 tablet 0   iron  polysaccharides (NIFEREX) 150 MG capsule TAKE 1 CAPSULE BY MOUTH EVERY DAY 90 capsule 1   lovastatin  (MEVACOR ) 40 MG tablet TAKE 1 TABLET BY MOUTH AT  BEDTIME 100 tablet 2   meclizine  (ANTIVERT ) 12.5 MG tablet TAKE 1 TABLET BY MOUTH THREE TIMES A DAY AS NEEDED FOR DIZZINESS 30 tablet 2   nystatin  (MYCOSTATIN ) 100000 UNIT/ML suspension Swish and spit 5mL two times daily after using Symbicort . 473 mL 5   pantoprazole  (  PROTONIX ) 40 MG tablet TAKE 1 TABLET BY MOUTH DAILY 100 tablet 2   polyethylene glycol (MIRALAX  / GLYCOLAX ) 17 g packet Take 17 g by mouth daily as needed for mild constipation or moderate constipation.     predniSONE  (DELTASONE ) 10 MG tablet 3 tabs by mouth per day for 3 days,2tabs per day for 3 days,1tab per day for 3 days 18 tablet 0   solifenacin  (VESICARE ) 10 MG tablet TAKE 1 TABLET BY MOUTH EVERY DAY 90 tablet 2   tiZANidine  (ZANAFLEX ) 4 MG tablet TAKE 1 TABLET BY MOUTH EVERY 6 HOURS AS NEEDED FOR MUSCLE SPASMS. 50 tablet 1   traMADol  (ULTRAM ) 50 MG tablet Take 1 tablet (50 mg total) by mouth every 6 (six) hours as needed. for pain 120 tablet 2   traZODone  (DESYREL ) 50 MG tablet TAKE 1/2 TO 1 TABLET BY MOUTH AT BEDTIME AS NEEDED FOR SLEEP 100  tablet 1   triamcinolone  cream (KENALOG ) 0.1 % Apply topically 2 (two) times daily. 30 g 1   valsartan  (DIOVAN ) 320 MG tablet Take 1 tablet (320 mg total) by mouth daily. 90 tablet 3   amoxicillin -clavulanate (AUGMENTIN ) 875-125 MG tablet Take 1 tablet by mouth every 12 (twelve) hours. (Patient not taking: Reported on 12/11/2023) 14 tablet 0   Fluticasone-Umeclidin-Vilant (TRELEGY ELLIPTA ) 200-62.5-25 MCG/ACT AEPB 2 samples (Patient not taking: Reported on 12/11/2023) 3 each 3   potassium chloride  SA (KLOR-CON  M20) 20 MEQ tablet Take 1 tablet (20 mEq total) by mouth daily. 90 tablet 3   valACYclovir  (VALTREX ) 1000 MG tablet Take 1 tab by mouth three times per day (Patient not taking: Reported on 12/11/2023) 21 tablet 0   No current facility-administered medications for this visit.     Review of Systems    She denies chest pain, pnd, orthopnea, n, v, dizziness, syncope, weight gain, or early satiety. All other systems reviewed and are otherwise negative except as noted above.   Physical Exam    VS:  BP 130/64   Pulse 60   Ht 5' 4 (1.626 m)   Wt 195 lb (88.5 kg)   SpO2 91%   BMI 33.47 kg/m   GEN: Well nourished, well developed, in no acute distress. HEENT: normal. Neck: Supple, no JVD, carotid bruits, or masses. Cardiac: RRR, no murmurs, rubs, or gallops. No clubbing, cyanosis, nonpitting bilateral lower extremity edema.  Radials/DP/PT 2+ and equal bilaterally.  Respiratory:  Respirations regular and unlabored, clear to auscultation bilaterally. GI: Soft, nontender, nondistended, BS + x 4. MS: no deformity or atrophy. Skin: warm and dry, no rash. Neuro:  Strength and sensation are intact. Psych: Normal affect.  Accessory Clinical Findings    ECG personally reviewed by me today - EKG Interpretation Date/Time:  Tuesday December 11 2023 13:34:20 EDT Ventricular Rate:  60 PR Interval:  156 QRS Duration:  90 QT Interval:  396 QTC Calculation: 396 R Axis:   10  Text  Interpretation: Normal sinus rhythm Normal ECG When compared with ECG of 13-Sep-2023 22:57, No significant change was found Confirmed by Daneen Perkins ([id number]) on 12/11/2023 1:39:08 PM  - no acute changes.   Lab Results  Component Value Date   WBC 7.4 11/25/2023   HGB 9.3 (L) 11/25/2023   HCT 30.5 (L) 11/25/2023   MCV 88.7 11/25/2023   PLT 287 11/25/2023   Lab Results  Component Value Date   CREATININE 1.06 (H) 11/25/2023   BUN 13 11/25/2023   NA 135 11/25/2023   K 3.9 11/25/2023   CL  97 (L) 11/25/2023   CO2 28 11/25/2023   Lab Results  Component Value Date   ALT 9 11/21/2023   AST 15 11/21/2023   ALKPHOS 53 11/21/2023   BILITOT 0.3 11/21/2023   Lab Results  Component Value Date   CHOL 158 11/21/2023   HDL 66.30 11/21/2023   LDLCALC 79 11/21/2023   TRIG 67.0 11/21/2023   CHOLHDL 2 11/21/2023    Lab Results  Component Value Date   HGBA1C 5.7 11/09/2022    Assessment & Plan    1. Palpitations: She reports a sensation of her heart racing at night when she gets up to go to the bathroom. She typically has assistance when she goes to the bathroom during the day. She uses a bedside commode at night, which causes her some anxiety. She denies any other palpitations, denies dizziness, presyncope or syncope.  EKG today shows sinus rhythm, 60 bpm. Suspect elevated heart rate is situational in the setting of anxiety.  Should symptoms progress or persist, consider cardiac monitor, through shared decision making, will defer for now.   2. Chronic diastolic heart failure: Most recent echocardiogram in 04/2023 showed EF 70 to 75%, hyperdynamic LV function, no RWMA, moderate asymmetric LVH of the basal septal segment, G2 DD, elevated left atrial pressure, normal RV systolic function, moderately elevated PASP, no significant valvular abnormalities.  She has stable dyspnea on exertion, stable nonpitting bilateral lower extremity edema.  Generally euvolemic and well compensated on exam.  Continue  Lasix .  3. Pulmonary hypertension: Noted to be likely secondary to COPD.  Stable on most recent echo.  Denies worsening dyspnea.  4. Hypertension: BP well controlled. Continue current antihypertensive regimen.  She is pending follow-up with the advanced hypertension clinic.  5. Hyperlipidemia: LDL was 79 in 10/2023. Continue lovastatin .   6. COPD/chronic hypoxic respiratory failure: On chronic home O2, followed by pulmonology.  7. Anemia/chronic hypercalcemia: Hemoglobin was low during recent ED visit (9.3), she has follow-up with hematology, repeat labs scheduled for tomorrow.   8. Disposition: Follow-up as scheduled with advanced hypertension clinic, follow-up in 3-4 months with Dr. Sheena.       Damien JAYSON Braver, NP 12/11/2023, 1:39 PM

## 2023-12-11 NOTE — Patient Instructions (Signed)
 Medication Instructions:  Your physician recommends that you continue on your current medications as directed. Please refer to the Current Medication list given to you today.  *If you need a refill on your cardiac medications before your next appointment, please call your pharmacy*  Lab Work: NONE ordered at this time of appointment   Testing/Procedures: NONE ordered at this time of appointment   Follow-Up: At [facility], you and your health needs are our priority.  As part of our continuing mission to provide you with exceptional heart care, our providers are all part of one team.  This team includes your primary Cardiologist (physician) and Advanced Practice Providers or APPs (Physician Assistants and Nurse Practitioners) who all work together to provide you with the care you need, when you need it.  Your next appointment:   3-4 month(s)  Provider:   Kardie Tobb, DO    We recommend signing up for the patient portal called MyChart.  Sign up information is provided on this After Visit Summary.  MyChart is used to connect with patients for Virtual Visits (Telemedicine).  Patients are able to view lab/test results, encounter notes, upcoming appointments, etc.  Non-urgent messages can be sent to your provider as well.   To learn more about what you can do with MyChart, go to [email or web address].

## 2023-12-12 ENCOUNTER — Inpatient Hospital Stay: Attending: Oncology

## 2023-12-12 ENCOUNTER — Inpatient Hospital Stay: Admitting: Oncology

## 2023-12-12 ENCOUNTER — Encounter: Payer: Self-pay | Admitting: Oncology

## 2023-12-12 VITALS — BP 145/63 | HR 59 | Temp 98.0°F | Resp 18 | Ht 64.0 in | Wt 199.8 lb

## 2023-12-12 DIAGNOSIS — D509 Iron deficiency anemia, unspecified: Secondary | ICD-10-CM | POA: Diagnosis not present

## 2023-12-12 DIAGNOSIS — I509 Heart failure, unspecified: Secondary | ICD-10-CM | POA: Diagnosis not present

## 2023-12-12 DIAGNOSIS — Z7982 Long term (current) use of aspirin: Secondary | ICD-10-CM | POA: Diagnosis not present

## 2023-12-12 DIAGNOSIS — I110 Hypertensive heart disease with heart failure: Secondary | ICD-10-CM | POA: Insufficient documentation

## 2023-12-12 DIAGNOSIS — Z87891 Personal history of nicotine dependence: Secondary | ICD-10-CM | POA: Diagnosis not present

## 2023-12-12 DIAGNOSIS — C9000 Multiple myeloma not having achieved remission: Secondary | ICD-10-CM | POA: Insufficient documentation

## 2023-12-12 DIAGNOSIS — Z79899 Other long term (current) drug therapy: Secondary | ICD-10-CM | POA: Diagnosis not present

## 2023-12-12 LAB — CBC WITH DIFFERENTIAL (CANCER CENTER ONLY)
Abs Immature Granulocytes: 0.02 K/uL (ref 0.00–0.07)
Basophils Absolute: 0.1 K/uL (ref 0.0–0.1)
Basophils Relative: 1 %
Eosinophils Absolute: 0.3 K/uL (ref 0.0–0.5)
Eosinophils Relative: 4 %
HCT: 30 % — ABNORMAL LOW (ref 36.0–46.0)
Hemoglobin: 9.4 g/dL — ABNORMAL LOW (ref 12.0–15.0)
Immature Granulocytes: 0 %
Lymphocytes Relative: 27 %
Lymphs Abs: 1.9 K/uL (ref 0.7–4.0)
MCH: 27.1 pg (ref 26.0–34.0)
MCHC: 31.3 g/dL (ref 30.0–36.0)
MCV: 86.5 fL (ref 80.0–100.0)
Monocytes Absolute: 1 K/uL (ref 0.1–1.0)
Monocytes Relative: 14 %
Neutro Abs: 3.7 K/uL (ref 1.7–7.7)
Neutrophils Relative %: 54 %
Platelet Count: 401 K/uL — ABNORMAL HIGH (ref 150–400)
RBC: 3.47 MIL/uL — ABNORMAL LOW (ref 3.87–5.11)
RDW: 14.9 % (ref 11.5–15.5)
WBC Count: 7 K/uL (ref 4.0–10.5)
nRBC: 0 % (ref 0.0–0.2)

## 2023-12-12 LAB — CMP (CANCER CENTER ONLY)
ALT: 9 U/L (ref 0–44)
AST: 13 U/L — ABNORMAL LOW (ref 15–41)
Albumin: 4 g/dL (ref 3.5–5.0)
Alkaline Phosphatase: 64 U/L (ref 38–126)
Anion gap: 4 — ABNORMAL LOW (ref 5–15)
BUN: 13 mg/dL (ref 8–23)
CO2: 34 mmol/L — ABNORMAL HIGH (ref 22–32)
Calcium: 10.6 mg/dL — ABNORMAL HIGH (ref 8.9–10.3)
Chloride: 98 mmol/L (ref 98–111)
Creatinine: 1.02 mg/dL — ABNORMAL HIGH (ref 0.44–1.00)
GFR, Estimated: 54 mL/min — ABNORMAL LOW (ref >60)
Glucose, Bld: 111 mg/dL — ABNORMAL HIGH (ref 70–99)
Potassium: 4.2 mmol/L (ref 3.5–5.1)
Sodium: 136 mmol/L (ref 135–145)
Total Bilirubin: 0.2 mg/dL (ref 0.0–1.2)
Total Protein: 7.8 g/dL (ref 6.5–8.1)

## 2023-12-12 LAB — LACTATE DEHYDROGENASE: LDH: 186 U/L (ref 98–192)

## 2023-12-12 LAB — FERRITIN: Ferritin: 62 ng/mL (ref 11–307)

## 2023-12-12 LAB — IRON AND IRON BINDING CAPACITY (CC-WL,HP ONLY)
Iron: 43 ug/dL (ref 28–170)
Saturation Ratios: 13 % (ref 10.4–31.8)
TIBC: 343 ug/dL (ref 250–450)
UIBC: 300 ug/dL (ref 148–442)

## 2023-12-12 NOTE — Assessment & Plan Note (Signed)
 Since patient had intermittent hypercalcemia dating back to 2008, we obtained myeloma labs.  SPEP showed no evidence of M spike.  IFE showed polyclonal increase in 1 or more immunoglobulins.  Both IgG and IgA were mildly elevated.  Serum free kappa was slightly increased at 42 mg/L, lambda was 22 mg/L, ratio almost normal at 1.93.  Overall no evidence of monoclonal gammopathy.  Calcium remains high at 10.6 today, slightly better than last time.  PTH was checked in April 2025 and it was normal.

## 2023-12-12 NOTE — Assessment & Plan Note (Signed)
 Hemoglobin levels have been consistently low for several years, with the lowest recorded value being 8.6 in 2015. No known blood loss. Patient has been on oral iron  supplements for an extended period.   On her initial consultation with us  on 04/19/2023, labs revealed persistent anemia with hemoglobin of 8.8, hematocrit 28.8, MCV 78.7.  White count and platelet count were within normal limits.  CMP showed creatinine of 1.01, calcium 10.9, total protein 8.1, albumin 4.3.  Iron  studies showed evidence of iron  deficiency with decreased iron  saturation of 5%, decreased iron  of 23.  Ferritin was decreased at 8.  Vitamin B12, folic acid , LDH were all normal.   Given persistent iron  deficiency anemia, despite oral iron  supplementation, we treated her with IV iron  with Feraheme x 2 doses, 1 week apart.   Iron  deficiency anemia with hemoglobin initially at 8.8, improved to 10.7 after IV iron , currently at 9.4. Normal hemoglobin is 12, but a target of 10 is considered acceptable for her. No signs of blood loss such as blood in stools or epistaxis.  Iron  studies show no evidence of iron  deficiency today.  Ferritin pending.  She was advised to continue oral iron  supplements for now (Nifrex).  If persistent iron  deficiency is noted, we will consider repeating another course of IV iron .   -Iron  deficiency is presumably from GI blood loss.  Apparently FOBT was negative recently.  Patient cannot undergo colonoscopy because of her medical comorbidities.  Hence we will monitor this for now.

## 2023-12-12 NOTE — Progress Notes (Signed)
 [facility]  HEMATOLOGY CLINIC PROGRESS NOTE  PATIENT NAME: Wendy Torres   MR#: [id number] DOB: 1940/07/07  Patient Care Team: [name], MD as PCP - General Tobb, Kardie, DO as PCP - Cardiology (Cardiology) Rodgers Dorothyann BRAVO, OT as Occupational Therapist (Occupational Therapy) Vernona Slough, OD as Consulting Physician (Ophthalmology) Craig Alan JONELLE DEVONNA (Gastroenterology)  Date of visit: 12/12/2023   ASSESSMENT & PLAN:   Wendy Torres is a 83 y.o. lady with a past medical history of hypertension, dyslipidemia, CHF, COPD, arthritis, H. pylori gastritis, was referred to our service for evaluation of anemia.  Workup was consistent with iron  deficiency anemia.  Treated with IV iron .  Iron  deficiency anemia Hemoglobin levels have been consistently low for several years, with the lowest recorded value being 8.6 in 2015. No known blood loss. Patient has been on oral iron  supplements for an extended period.   On her initial consultation with us  on 04/19/2023, labs revealed persistent anemia with hemoglobin of 8.8, hematocrit 28.8, MCV 78.7.  White count and platelet count were within normal limits.  CMP showed creatinine of 1.01, calcium 10.9, total protein 8.1, albumin 4.3.  Iron  studies showed evidence of iron  deficiency with decreased iron  saturation of 5%, decreased iron  of 23.  Ferritin was decreased at 8.  Vitamin B12, folic acid , LDH were all normal.   Given persistent iron  deficiency anemia, despite oral iron  supplementation, we treated her with IV iron  with Feraheme x 2 doses, 1 week apart.   Iron  deficiency anemia with hemoglobin initially at 8.8, improved to 10.7 after IV iron , currently at 9.4. Normal hemoglobin is 12, but a target of 10 is considered acceptable for her. No signs of blood loss such as blood in stools or epistaxis.  Iron  studies show no evidence of iron  deficiency today.  Ferritin pending.  She was advised to continue oral iron   supplements for now (Nifrex).  If persistent iron  deficiency is noted, we will consider repeating another course of IV iron .   -Iron  deficiency is presumably from GI blood loss.  Apparently FOBT was negative recently.  Patient cannot undergo colonoscopy because of her medical comorbidities.  Hence we will monitor this for now.  Hypercalcemia Since patient had intermittent hypercalcemia dating back to 2008, we obtained myeloma labs.  SPEP showed no evidence of M spike.  IFE showed polyclonal increase in 1 or more immunoglobulins.  Both IgG and IgA were mildly elevated.  Serum free kappa was slightly increased at 42 mg/L, lambda was 22 mg/L, ratio almost normal at 1.93.  Overall no evidence of monoclonal gammopathy.  Calcium remains high at 10.6 today, slightly better than last time.  PTH was checked in April 2025 and it was normal.    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 Currently managed with gabapentin . - Continue gabapentin  for nerve pain  I spent a total of 20 minutes during this encounter with the patient including review of chart and various tests results, discussions about plan of care and coordination of care plan.  I reviewed lab results and outside records for this visit and discussed relevant results with the patient. Diagnosis, plan of care and treatment options were also discussed in detail with the patient. Opportunity provided to ask questions and answers provided to her apparent satisfaction. Provided instructions to call our clinic with any problems, questions or concerns prior to return visit. I recommended to continue follow-up  with PCP and sub-specialists. She verbalized understanding and agreed with the plan. No barriers to learning was detected.  [name], MD  12/12/2023 5:45 PM  [name] - [facility] [address] [place] [address] Dept: [phone] Dept Fax: [phone]   CHIEF COMPLAINT/ REASON FOR VISIT:  Follow-up for iron  deficiency anemia, unspecified iron  deficiency.  Also has chronic hypercalcemia of uncertain etiology dating back to 2008.  INTERVAL HISTORY:  Patient returns for follow-up.  She was accompanied by her daughter who is very supportive.  Discussed the use of AI scribe software for clinical note transcription with the patient, who gave verbal consent to proceed.  History of Present Illness [name] is an 83 year old female with anemia who presents for follow-up of her hemoglobin levels.  She has a history of anemia with hemoglobin levels fluctuating between 8.8 and 10.7 g/dL. Her current hemoglobin level is 9.4 g/dL, following previous IV iron  therapy which improved but did not normalize her levels. She is taking Nifrex iron  capsules, which are covered by her insurance. No symptoms of blood loss, such as blood in stools, black stools, epistaxis, or gum bleeds.  She has a history of shingles affecting her face a couple of months ago and is currently taking gabapentin  for nerve pain associated with postherpetic neuralgia. She is unsure if she is still taking an antiviral medication like Valacyclovir  or Valtrex .  She is not taking any B vitamin supplements.   The patient has a longstanding history of slightly elevated calcium levels, dating back to 2008, which is not currently being treated.  SUMMARY OF HEMATOLOGIC HISTORY:  On 02/08/2023, labs at her PCPs office showed hemoglobin of 9.1, hematocrit of 31.4, MCV 80.7.  White count was 5800, platelet count 419,000.  Iron  studies showed evidence of iron  deficiency with iron  saturation of 8%, iron  decreased at 32.  Ferritin was borderline low at 17.7.  She was referred to us  for further management of iron  deficiency anemia, which was not resolving with oral iron  supplementation.   The  patient's hemoglobin levels have been fluctuating for several years, with the lowest recorded value being 8.6 in 2015. The patient's current hemoglobin level is around 10, which is slightly below the normal range. The patient denies any known blood loss and has not had any iron  infusions or blood transfusions. The patient is currently taking iron  supplements daily. The patient also reports experiencing headaches, which have been persistent. The patient has been on oxygen  therapy for COPD for about three years and reports feeling lightheaded upon standing and shortness of breath when walking. The patient quit smoking over forty years ago.   Hemoglobin levels have been consistently low for several years, with the lowest recorded value being 8.6 in 2015. No known blood loss. Patient has been on oral iron  supplements for an extended period.   On her initial consultation with us  on 04/19/2023, labs revealed persistent anemia with hemoglobin of 8.8, hematocrit 28.8, MCV 78.7.  White count and platelet count were within normal limits.  CMP showed creatinine of 1.01, calcium 10.9, total protein 8.1, albumin 4.3.  Iron  studies showed evidence of iron  deficiency with decreased iron  saturation of 5%, decreased iron  of 23.  Ferritin was decreased at 8.  Vitamin B12, folic acid , LDH were all normal.   Given persistent iron  deficiency anemia, despite oral iron  supplementation, we treated her with IV iron  with  Feraheme x 2 doses, 1 week apart.    -Iron  deficiency is presumably from GI blood loss.  Apparently FOBT was negative recently.  Patient cannot undergo colonoscopy because of her medical comorbidities.  Hence we will monitor this for now.  Since patient had intermittent hypercalcemia, we obtained myeloma labs.  SPEP showed no evidence of M spike.  IFE showed polyclonal increase in 1 or more immunoglobulins.  Both IgG and IgA were mildly elevated.  Serum free kappa was slightly increased at 42 mg/L, lambda was 22  mg/L, ratio almost normal at 1.93.  Overall no evidence of monoclonal gammopathy.   Since the cause of anemia seems to be obvious from iron  deficiency, I am not pursuing extensive workup at this time.  If inadequate response to IV iron  is noted, we will pursue workup to rule out other etiologies.  I have reviewed the past medical history, past surgical history, social history and family history with the patient and they are unchanged from previous note.  ALLERGIES: She is allergic to chlorthalidone and benazepril .  MEDICATIONS:  Current Outpatient Medications  Medication Sig Dispense Refill   acetaminophen  (TYLENOL ) 325 MG tablet Take 2 tablets (650 mg total) by mouth every 6 (six) hours as needed for mild pain (or Fever >/= 101). 40 tablet 0   albuterol  (VENTOLIN  HFA) 108 (90 Base) MCG/ACT inhaler Inhale 2 puffs into the lungs every 4 (four) hours as needed for wheezing or shortness of breath. 8.5 each 1   ALPRAZolam  (XANAX ) 0.25 MG tablet Take 1 tablet (0.25 mg total) by mouth 2 (two) times daily as needed. 60 tablet 2   amLODipine  (NORVASC ) 10 MG tablet TAKE 1 TABLET BY MOUTH EVERY DAY 90 tablet 3   aspirin  EC 81 MG tablet Take 1 tablet (81 mg total) by mouth daily. Swallow whole.     Budeson-Glycopyrrol-Formoterol  (BREZTRI  AEROSPHERE) 160-9-4.8 MCG/ACT AERO Inhale 2 puffs into the lungs in the morning and at bedtime. 3 each 3   cloNIDine  (CATAPRES ) 0.2 MG tablet TAKE 1 TABLET BY MOUTH TWICE  DAILY 180 tablet 1   Fexofenadine HCl (MUCINEX  ALLERGY PO) Take 1 tablet by mouth as needed (congestion).     Fluticasone-Umeclidin-Vilant (TRELEGY ELLIPTA ) 200-62.5-25 MCG/ACT AEPB 2 samples 3 each 3   Fluticasone-Umeclidin-Vilant (TRELEGY ELLIPTA ) 200-62.5-25 MCG/ACT AEPB Inhale 1 Inhalation into the lungs daily.     furosemide  (LASIX ) 40 MG tablet Take 1 tablet (40 mg total) by mouth daily. 90 tablet 3   gabapentin  (NEURONTIN ) 600 MG tablet Take 1 tablet (600 mg total) by mouth 3 (three) times daily.  270 tablet 1   guaiFENesin  (MUCINEX ) 600 MG 12 hr tablet Take 1 tablet (600 mg total) by mouth 2 (two) times daily. 30 tablet 0   hydrALAZINE  (APRESOLINE ) 100 MG tablet TAKE 1 TABLET BY MOUTH 3 TIMES  DAILY 270 tablet 2   iron  polysaccharides (NIFEREX) 150 MG capsule TAKE 1 CAPSULE BY MOUTH EVERY DAY 90 capsule 1   lovastatin  (MEVACOR ) 40 MG tablet TAKE 1 TABLET BY MOUTH AT  BEDTIME 100 tablet 2   meclizine  (ANTIVERT ) 12.5 MG tablet TAKE 1 TABLET BY MOUTH THREE TIMES A DAY AS NEEDED FOR DIZZINESS 30 tablet 2   nystatin  (MYCOSTATIN ) 100000 UNIT/ML suspension Swish and spit 5mL two times daily after using Symbicort . 473 mL 5   pantoprazole  (PROTONIX ) 40 MG tablet TAKE 1 TABLET BY MOUTH DAILY 100 tablet 2   polyethylene glycol (MIRALAX  / GLYCOLAX ) 17 g packet Take 17 g by mouth daily as  needed for mild constipation or moderate constipation.     potassium chloride  SA (KLOR-CON  M20) 20 MEQ tablet Take 1 tablet (20 mEq total) by mouth daily. 90 tablet 3   predniSONE  (DELTASONE ) 10 MG tablet 3 tabs by mouth per day for 3 days,2tabs per day for 3 days,1tab per day for 3 days 18 tablet 0   solifenacin  (VESICARE ) 10 MG tablet TAKE 1 TABLET BY MOUTH EVERY DAY 90 tablet 2   tiZANidine  (ZANAFLEX ) 4 MG tablet TAKE 1 TABLET BY MOUTH EVERY 6 HOURS AS NEEDED FOR MUSCLE SPASMS. 50 tablet 1   traMADol  (ULTRAM ) 50 MG tablet Take 1 tablet (50 mg total) by mouth every 6 (six) hours as needed. for pain 120 tablet 2   traZODone  (DESYREL ) 50 MG tablet TAKE 1/2 TO 1 TABLET BY MOUTH AT BEDTIME AS NEEDED FOR SLEEP 100 tablet 1   triamcinolone  cream (KENALOG ) 0.1 % Apply topically 2 (two) times daily. 30 g 1   valACYclovir  (VALTREX ) 1000 MG tablet Take 1 tab by mouth three times per day 21 tablet 0   valsartan  (DIOVAN ) 320 MG tablet Take 1 tablet (320 mg total) by mouth daily. 90 tablet 3   No current facility-administered medications for this visit.     REVIEW OF SYSTEMS:    Review of Systems  Cardiovascular:  Positive  for leg swelling.  Neurological:  Positive for dizziness and numbness.    All other pertinent systems were reviewed with the patient and are negative.  PHYSICAL EXAMINATION:   Onc Performance Status - 12/12/23 1439       ECOG Perf Status   ECOG Perf Status Ambulatory and capable of all selfcare but unable to carry out any work activities.  Up and about more than 50% of waking hours      KPS SCALE   KPS % SCORE Cares for self, unable to carry on normal activity or to do active work          Vitals:   12/12/23 1434  BP: (!) 145/63  Pulse: (!) 59  Resp: 18  Temp: 98 F (36.7 C)  SpO2: 93%   Filed Weights   12/12/23 1434  Weight: 199 lb 12.8 oz (90.6 kg)    Physical Exam Constitutional:      General: She is not in acute distress.    Appearance: Normal appearance.     Comments: Presented to clinic in a wheelchair today.  HENT:     Head: Normocephalic and atraumatic.  Eyes:     General: No scleral icterus.    Conjunctiva/sclera: Conjunctivae normal.  Cardiovascular:     Rate and Rhythm: Normal rate and regular rhythm.  Pulmonary:     Effort: Pulmonary effort is normal.     Comments: Wearing O2 via nasal cannula Abdominal:     General: There is no distension.  Musculoskeletal:     Right lower leg: Edema present.     Left lower leg: Edema present.  Neurological:     General: No focal deficit present.     Mental Status: She is alert and oriented to person, place, and time.  Psychiatric:        Mood and Affect: Mood normal.        Behavior: Behavior normal.     LABORATORY DATA:   I have reviewed the data as listed.  Results for orders placed or performed in visit on 12/12/23  Lactate dehydrogenase  Result Value Ref Range   LDH 186 98 - 192  U/L  Iron  and Iron  Binding Capacity ([facility] only)  Result Value Ref Range   Iron  43 28 - 170 ug/dL   TIBC 656 749 - 549 ug/dL   Saturation Ratios 13 10.4 - 31.8 %   UIBC 300 148 - 442 ug/dL  CMP ([facility]  only)  Result Value Ref Range   Sodium 136 135 - 145 mmol/L   Potassium 4.2 3.5 - 5.1 mmol/L   Chloride 98 98 - 111 mmol/L   CO2 34 (H) 22 - 32 mmol/L   Glucose, Bld 111 (H) 70 - 99 mg/dL   BUN 13 8 - 23 mg/dL   Creatinine 8.97 (H) 9.55 - 1.00 mg/dL   Calcium 89.3 (H) 8.9 - 10.3 mg/dL   Total Protein 7.8 6.5 - 8.1 g/dL   Albumin 4.0 3.5 - 5.0 g/dL   AST 13 (L) 15 - 41 U/L   ALT 9 0 - 44 U/L   Alkaline Phosphatase 64 38 - 126 U/L   Total Bilirubin 0.2 0.0 - 1.2 mg/dL   GFR, Estimated 54 (L) >60 mL/min   Anion gap 4 (L) 5 - 15  CBC with Differential ([facility]cer [facility] Only)  Result Value Ref Range   WBC Count 7.0 4.0 - 10.5 K/uL   RBC 3.47 (L) 3.87 - 5.11 MIL/uL   Hemoglobin 9.4 (L) 12.0 - 15.0 g/dL   HCT 69.9 (L) 63.9 - 53.9 %   MCV 86.5 80.0 - 100.0 fL   MCH 27.1 26.0 - 34.0 pg   MCHC 31.3 30.0 - 36.0 g/dL   RDW 85.0 88.4 - 84.4 %   Platelet Count 401 (H) 150 - 400 K/uL   nRBC 0.0 0.0 - 0.2 %   Neutrophils Relative % 54 %   Neutro Abs 3.7 1.7 - 7.7 K/uL   Lymphocytes Relative 27 %   Lymphs Abs 1.9 0.7 - 4.0 K/uL   Monocytes Relative 14 %   Monocytes Absolute 1.0 0.1 - 1.0 K/uL   Eosinophils Relative 4 %   Eosinophils Absolute 0.3 0.0 - 0.5 K/uL   Basophils Relative 1 %   Basophils Absolute 0.1 0.0 - 0.1 K/uL   Immature Granulocytes 0 %   Abs Immature Granulocytes 0.02 0.00 - 0.07 K/uL    RADIOGRAPHIC STUDIES:  No recent pertinent imaging studies available to review.  Orders Placed This Encounter  Procedures   CBC with Differential ([facility] Only)    Standing Status:   Future    Expected Date:   03/13/2024    Expiration Date:   06/11/2024   CMP ([facility] only)    Standing Status:   Future    Expected Date:   03/13/2024    Expiration Date:   06/11/2024   Iron  and Iron  Binding Capacity ([facility] only)    Standing Status:   Future    Expected Date:   03/13/2024    Expiration Date:   06/11/2024   Ferritin    Standing Status:   Future    Expected Date:    03/13/2024    Expiration Date:   06/11/2024   Vitamin B12    Standing Status:   Future    Expected Date:   03/13/2024    Expiration Date:   06/11/2024   Folate    Standing Status:   Future    Expected Date:   03/13/2024    Expiration Date:   06/11/2024     Future Appointments  Date Time Provider Department Center  12/13/2023  1:30 PM Vannie Reche RAMAN, NP [facility]  04/22/2024  2:40 PM Tobb, Kardie, DO [facility]  05/27/2024 10:40 AM [name], MD [facility] None  05/27/2024  2:30 PM [facility]ANNUAL WELLNESS VISIT [facility] None     This document was completed utilizing speech recognition software. Grammatical errors, random word insertions, pronoun errors, and incomplete sentences are an occasional consequence of this system due to software limitations, ambient noise, and hardware issues. Any formal questions or concerns about the content, text or information contained within the body of this dictation should be directly addressed to the provider for clarification.

## 2023-12-13 ENCOUNTER — Encounter: Payer: Self-pay | Admitting: Nurse Practitioner

## 2023-12-13 ENCOUNTER — Encounter (HOSPITAL_BASED_OUTPATIENT_CLINIC_OR_DEPARTMENT_OTHER): Payer: Self-pay | Admitting: Family

## 2023-12-13 ENCOUNTER — Ambulatory Visit (HOSPITAL_BASED_OUTPATIENT_CLINIC_OR_DEPARTMENT_OTHER): Admitting: Family

## 2023-12-13 ENCOUNTER — Telehealth: Payer: Self-pay | Admitting: Oncology

## 2023-12-13 VITALS — BP 132/54 | HR 66 | Ht 64.0 in | Wt 198.1 lb

## 2023-12-13 DIAGNOSIS — E782 Mixed hyperlipidemia: Secondary | ICD-10-CM

## 2023-12-13 DIAGNOSIS — I2510 Atherosclerotic heart disease of native coronary artery without angina pectoris: Secondary | ICD-10-CM

## 2023-12-13 DIAGNOSIS — I5032 Chronic diastolic (congestive) heart failure: Secondary | ICD-10-CM | POA: Diagnosis not present

## 2023-12-13 DIAGNOSIS — I700 Atherosclerosis of aorta: Secondary | ICD-10-CM | POA: Diagnosis not present

## 2023-12-13 DIAGNOSIS — I10 Essential (primary) hypertension: Secondary | ICD-10-CM

## 2023-12-13 LAB — KAPPA/LAMBDA LIGHT CHAINS
Kappa free light chain: 44.2 mg/L — ABNORMAL HIGH (ref 3.3–19.4)
Kappa, lambda light chain ratio: 1.71 — ABNORMAL HIGH (ref 0.26–1.65)
Lambda free light chains: 25.8 mg/L (ref 5.7–26.3)

## 2023-12-13 MED ORDER — AMLODIPINE BESYLATE-VALSARTAN 10-320 MG PO TABS: 1.0000 | ORAL_TABLET | Freq: Every day | ORAL | 3 refills | Status: DC

## 2023-12-13 NOTE — Patient Instructions (Addendum)
 Medication Instructions:   STOP Amlodipine   STOP Valsartan   START Amlodipine -Valsartan  10-320mg  once per day  Try to space out your medications: 8AM, 1-2p, bedtime. This will help to keep the medication in your system most consistently.    Labwork: None ordered today.   Testing/Procedures: Your physician has requested that you have a renal artery duplex. During this test, an ultrasound is used to evaluate blood flow to the kidneys. If there is plaque build up in your kidney arteries, it can cause higher blood pressures. Allow one hour for this exam. Do not eat after midnight the day before and avoid carbonated beverages. Take your medications as you usually do.    Follow-Up: Please follow up in as needed in [facility] with Dr. Raford, Reche Finder, NP or Allean Mink PharmD    Special Instructions:   Reche GORMAN Finder, NP will send you a MyChart message in 3 weeks to check in on blood pressure.

## 2023-12-13 NOTE — Telephone Encounter (Signed)
 I contacted [facility] to schedule her follow up appointment with Dr. Autumn. I spoke with Katrina and she is aware of all appointment details.

## 2023-12-13 NOTE — Progress Notes (Signed)
 [facility] Initial Assessment:    Date:  12/13/2023   ID:  Wendy Torres, DOB 1940/11/01, MRN [id number]  PCP:  [name], MD  Cardiologist:  Dub Huntsman, DO  Nephrologist:  Referring MD: Janene Boer, PA   CC: Hypertension  History of Present Illness:    Wendy Torres is a 83 y.o. female with a hx of aortic atherosclerosis, hypertension, coronary calcification on CT, chronic diastolic heart failure, pulmonary hypertension, hyperlipidemia, COPD, chronic hypoxic respiratory failure on home O2, TIA, IDA, arthritis, GERD. Here to establish care in the [facility].   Prior CT 01/2023 with normal adrenal gland.  Echocardiogram December 2024 LVEF 70 to 75%, hyperdynamic LV function, no RWMA, moderate asymmetric LVH of basal septal segment, grade 2 diastolic dysfunction, elevated LVEDP pressure, RV normal, moderately elevated PASP, no significant valvular abnormalities.  11/21/2023 normal TSH 1.48.  At visit with Boer Janene, PA 07/23/2023 BP elevated and valsartan  increased to 320 mg daily.  She was referred to [facility].  ED visit 11/25/2023 with headache, hypertension with BP improved with clonidine  in ED workup otherwise unremarkable.  She saw Damien Braver, NP in follow up 12/11/23 with BP 130/64. No changes made and recommended to follow up in 3-4 months.   Wendy Torres presents today for follow up with her daughter. She was diagnosed with hypertension a long time ago. It has been difficult to control. Blood pressure checked with arm cuff at home. Readings have been 135-160s/60-70s. she reports tobacco use previously having quit in 1974. For exercise she ambulates around her home. she eats at home and does try to follow low sodium diet but does often eat pre-prepared foods for ease. Eats three meals per day, does not add salt. She drinks one cup of coffee in the morning and then drinks water or juice during the da. Takes her medication int he  mroning around 8-8:30am, 12p, 6p. Reports occasional lightheadedness when she changes position. No near syncope, syncope.   Previous antihypertensives: Spironolactone  Past Medical History:  Diagnosis Date   ALLERGIC RHINITIS 12/09/2006   ANEMIA 01/14/2009   Anxiety    Arthritis    ASTHMA 12/09/2006   Cataract    bilateral -removed   CHF (congestive heart failure) (HCC)    Chronic pain syndrome 02/16/2009   COPD 12/09/2006   DEGENERATIVE JOINT DISEASE 12/09/2006   DIZZINESS 03/30/2010   DYSPNEA 01/27/2010   with heavy exertion   FREQUENCY, URINARY 03/30/2010   GERD 12/09/2006   HELICOBACTER PYLORI GASTRITIS, HX OF 12/09/2006   HIATAL HERNIA 03/01/2010   HYPERLIPIDEMIA 04/18/2007   HYPERSOMNIA 08/31/2008   HYPERTENSION 12/09/2006   INSOMNIA-SLEEP DISORDER-UNSPEC 11/05/2008   LOW BACK PAIN 12/09/2006   Morbid obesity (HCC) 12/09/2006   Nonspecific (abnormal) findings on radiological and other examination of body structure 11/05/2008   OSTEOPOROSIS 12/09/2006   OVERACTIVE BLADDER 04/14/2008   PALPITATIONS, RECURRENT 01/27/2010   Stroke (HCC)    pt. stated light one years ago   SYNCOPE 11/05/2008   TRANSIENT ISCHEMIC ATTACK, HX OF 12/09/2006    Past Surgical History:  Procedure Laterality Date   BIOPSY  01/18/2023   Procedure: BIOPSY;  Surgeon: Aneita Gwendlyn DASEN, MD;  Location: [facility];  Service: Gastroenterology;;   COLONOSCOPY     ESOPHAGOGASTRODUODENOSCOPY (EGD) WITH PROPOFOL  N/A 01/18/2023   Procedure: ESOPHAGOGASTRODUODENOSCOPY (EGD) WITH PROPOFOL ;  Surgeon: Aneita Gwendlyn DASEN, MD;  Location: [facility];  Service: Gastroenterology;  Laterality: N/A;   HOT HEMOSTASIS N/A 01/18/2023  Procedure: HOT HEMOSTASIS (ARGON PLASMA COAGULATION/BICAP);  Surgeon: Aneita Gwendlyn DASEN, MD;  Location: [facility];  Service: Gastroenterology;  Laterality: N/A;   LUMBAR DISC SURGERY     TONSILLECTOMY     as a child   TOTAL HIP ARTHROPLASTY Right 11/06/2012   Procedure: RIGHT TOTAL  HIP ARTHROPLASTY WITH ACETABULAR AUTOGRAFT;  Surgeon: Dempsey LULLA Moan, MD;  Location: [facility];  Service: Orthopedics;  Laterality: Right;   TOTAL HIP ARTHROPLASTY Left 02/18/2014   Procedure: LEFT TOTAL HIP ARTHROPLASTY;  Surgeon: Dempsey Moan LULLA, MD;  Location: [facility];  Service: Orthopedics;  Laterality: Left;   TUBAL LIGATION      Current Medications: Current Meds  Medication Sig   acetaminophen  (TYLENOL ) 325 MG tablet Take 2 tablets (650 mg total) by mouth every 6 (six) hours as needed for mild pain (or Fever >/= 101).   ALPRAZolam  (XANAX ) 0.25 MG tablet Take 1 tablet (0.25 mg total) by mouth 2 (two) times daily as needed.   aspirin  EC 81 MG tablet Take 1 tablet (81 mg total) by mouth daily. Swallow whole.   cloNIDine  (CATAPRES ) 0.2 MG tablet TAKE 1 TABLET BY MOUTH TWICE  DAILY   Fluticasone-Umeclidin-Vilant (TRELEGY ELLIPTA ) 200-62.5-25 MCG/ACT AEPB Inhale 1 Inhalation into the lungs daily.   furosemide  (LASIX ) 40 MG tablet Take 1 tablet (40 mg total) by mouth daily.   gabapentin  (NEURONTIN ) 600 MG tablet Take 1 tablet (600 mg total) by mouth 3 (three) times daily.   guaiFENesin  (MUCINEX ) 600 MG 12 hr tablet Take 1 tablet (600 mg total) by mouth 2 (two) times daily. (Patient taking differently: Take 600 mg by mouth daily.)   hydrALAZINE  (APRESOLINE ) 100 MG tablet TAKE 1 TABLET BY MOUTH 3 TIMES  DAILY   iron  polysaccharides (NIFEREX) 150 MG capsule TAKE 1 CAPSULE BY MOUTH EVERY DAY   lovastatin  (MEVACOR ) 40 MG tablet TAKE 1 TABLET BY MOUTH AT  BEDTIME   meclizine  (ANTIVERT ) 12.5 MG tablet TAKE 1 TABLET BY MOUTH THREE TIMES A DAY AS NEEDED FOR DIZZINESS   nystatin  (MYCOSTATIN ) 100000 UNIT/ML suspension Swish and spit 5mL two times daily after using Symbicort .   polyethylene glycol (MIRALAX  / GLYCOLAX ) 17 g packet Take 17 g by mouth daily as needed for mild constipation or moderate constipation.   potassium chloride  SA (KLOR-CON  M20) 20 MEQ tablet Take 1 tablet (20 mEq total) by mouth daily.    predniSONE  (DELTASONE ) 10 MG tablet 3 tabs by mouth per day for 3 days,2tabs per day for 3 days,1tab per day for 3 days   solifenacin  (VESICARE ) 10 MG tablet TAKE 1 TABLET BY MOUTH EVERY DAY   traMADol  (ULTRAM ) 50 MG tablet Take 1 tablet (50 mg total) by mouth every 6 (six) hours as needed. for pain   traZODone  (DESYREL ) 50 MG tablet TAKE 1/2 TO 1 TABLET BY MOUTH AT BEDTIME AS NEEDED FOR SLEEP   triamcinolone  cream (KENALOG ) 0.1 % Apply topically 2 (two) times daily.   [DISCONTINUED] amLODipine  (NORVASC ) 10 MG tablet TAKE 1 TABLET BY MOUTH EVERY DAY   [DISCONTINUED] amLODipine -valsartan  (EXFORGE ) 10-320 MG tablet Take 1 tablet by mouth daily.   [DISCONTINUED] valsartan  (DIOVAN ) 320 MG tablet Take 1 tablet (320 mg total) by mouth daily.     Allergies:   Chlorthalidone and Benazepril    Social History   Socioeconomic History   Marital status: Widowed    Spouse name: Not on file   Number of children: 10   Years of education: Not on file   Highest education level:  10th grade  Occupational History   Occupation: Retired  Tobacco Use   Smoking status: Former    Current packs/day: 0.00    Average packs/day: 0.5 packs/day for 25.0 years (12.5 ttl pk-yrs)    Types: Cigarettes    Start date: 10/30/1947    Quit date: 10/29/1972    Years since quitting: 51.1   Smokeless tobacco: Never  Vaping Use   Vaping status: Never Used  Substance and Sexual Activity   Alcohol  use: No    Alcohol /week: 0.0 standard drinks of alcohol    Drug use: No   Sexual activity: Not Currently  Other Topics Concern   Not on file  Social History Narrative   Right handed    Wears readers   Drinks 1 cup of coffee in am   Drinks soda 1 per day.   Social Drivers of Health   Financial Resource Strain: Medium Risk (12/13/2023)   Overall Financial Resource Strain (CARDIA)    Difficulty of Paying Living Expenses: Somewhat hard  Food Insecurity: No Food Insecurity (12/13/2023)   Hunger Vital Sign    Worried About  Running Out of Food in the Last Year: Never true    Ran Out of Food in the Last Year: Never true  Transportation Needs: No Transportation Needs (12/13/2023)   PRAPARE - Administrator, Civil Service (Medical): No    Lack of Transportation (Non-Medical): No  Physical Activity: Inactive (12/13/2023)   Exercise Vital Sign    Days of Exercise per Week: 0 days    Minutes of Exercise per Session: 0 min  Stress: No Stress Concern Present (12/13/2023)   [employer] of Occupational Health - Occupational Stress Questionnaire    Feeling of Stress: Not at all  Recent Concern: Stress - Stress Concern Present (09/28/2023)   [employer] of Occupational Health - Occupational Stress Questionnaire    Feeling of Stress : To some extent  Social Connections: Moderately Isolated (12/13/2023)   Social Connection and Isolation Panel    Frequency of Communication with Friends and Family: More than three times a week    Frequency of Social Gatherings with Friends and Family: Once a week    Attends Religious Services: More than 4 times per year    Active Member of [employer] or Organizations: No    Attends Banker Meetings: Never    Marital Status: Widowed     Family History: The patient's family history includes Heart disease in her mother; Hypertension in her brother; Lung cancer in her father. There is no history of Diabetes, Colon cancer, Colon polyps, Kidney disease, Gallbladder disease, Esophageal cancer, Allergic rhinitis, Angioedema, Asthma, Atopy, Eczema, Immunodeficiency, or Urticaria.  ROS:   Please see the history of present illness.     All other systems reviewed and are negative.  EKGs/Labs/Other Studies Reviewed:         Recent Labs: 02/28/2023: Magnesium 1.9; NT-Pro BNP 109 11/21/2023: TSH 1.48 11/25/2023: B Natriuretic Peptide 27.3 12/12/2023: ALT 9; BUN 13; Creatinine 1.02; Hemoglobin 9.4; Platelet Count 401; Potassium 4.2; Sodium 136   Recent Lipid Panel     Component Value Date/Time   CHOL 158 11/21/2023 1444   TRIG 67.0 11/21/2023 1444   HDL 66.30 11/21/2023 1444   CHOLHDL 2 11/21/2023 1444   VLDL 13.4 11/21/2023 1444   LDLCALC 79 11/21/2023 1444    Physical Exam:   VS:  BP (!) 132/54 (BP Location: Right Arm, Patient Position: Sitting, Cuff Size: Large)   Pulse 66  Ht 5' 4 (1.626 m)   Wt 198 lb 1.6 oz (89.9 kg)   SpO2 97%   BMI 34.00 kg/m  , BMI Body mass index is 34 kg/m. GENERAL:  Well appearing HEENT: Pupils equal round and reactive, fundi not visualized, oral mucosa unremarkable NECK:  No jugular venous distention, waveform within normal limits, carotid upstroke brisk and symmetric, no bruits, no thyromegaly LYMPHATICS:  No cervical adenopathy LUNGS:  Clear to auscultation bilaterally HEART:  RRR.  PMI not displaced or sustained,S1 and S2 within normal limits, no S3, no S4, no clicks, no rubs, no murmurs ABD:  Flat, positive bowel sounds normal in frequency in pitch, no bruits, no rebound, no guarding, no midline pulsatile mass, no hepatomegaly, no splenomegaly EXT:  2 plus pulses throughout, no edema, no cyanosis no clubbing SKIN:  No rashes no nodules NEURO:  Cranial nerves II through XII grossly intact, motor grossly intact throughout PSYCH:  Cognitively intact, oriented to person place and time   ASSESSMENT/PLAN:    HTN - BP reasonably controlled in clinic but labile at home. BP goal <140/90 and ideally <130/80.  For consolidation, stop Amlodipine  and Valsartan . Start Amlodipine -Valsartan  10-320mg  daily. Continue Hydralazine  100mg  BID.  Continue Clonidine  0.2mg  PO BID. Clonidine  patch unfortunately $100 per month and cost prohibitive.  Adjust timing of medications to 8am, 1-2pm, bedtime for better 24h coverage.  Secondary workup Plan to update renal artery duplex to rule out stenosis No symptoms suggestive of OSA CT 2024 normal adrenal glands Consider renin-aldosterone with next labs. Defer today as had multiple  labs yesterday  HFpEF - Euvolemic and well compensated on exam. NYHA II .management per primary cardiology team. GDMT valsartan  320mg  daily (in combination tablet with Amlodipine ), Lasix  40mg  daily, Hydralazine  100mg  TID. Low sodium diet, fluid restriction <2L, and daily weights encouraged. Educated to contact our office for weight gain of 2 lbs overnight or 5 lbs in one week.   Coronary calcification on CT / aortic atherosclerosis / HLD - Stable with no anginal symptoms. No indication for ischemic evaluation.  GDMT aspirin  81mg  daily, lovastatin  40mg  daily.   Screening for Secondary Hypertension:     Relevant Labs/Studies:    Latest Ref Rng & Units 12/12/2023    2:14 PM 11/25/2023   10:50 AM 11/21/2023    2:44 PM  Basic Labs  Sodium 135 - 145 mmol/L 136  135  134   Potassium 3.5 - 5.1 mmol/L 4.2  3.9  4.3   Creatinine 0.44 - 1.00 mg/dL 8.97  8.93  8.78        Latest Ref Rng & Units 11/21/2023    2:44 PM 04/19/2023    2:07 PM  Thyroid    TSH 0.35 - 5.50 uIU/mL 1.48  1.588        Latest Ref Rng & Units 05/03/2020    1:14 PM  Renin/Aldosterone   Aldosterone 0.0 - 30.0 ng/dL 4.9              1/85/7974    2:43 PM  Renovascular   Renal Artery US  Completed Yes    Disposition:    FU with [facility] as needed   Medication Adjustments/Labs and Tests Ordered: Current medicines are reviewed at length with the patient today.  Concerns regarding medicines are outlined above.  Orders Placed This Encounter  Procedures   VAS US  RENAL ARTERY DUPLEX   Meds ordered this encounter  Medications   DISCONTD: amLODipine -valsartan  (EXFORGE ) 10-320 MG tablet    Sig: Take 1  tablet by mouth daily.    Dispense:  90 tablet    Refill:  3    Supervising Provider:   CHRISTOPHER, BRIDGETTE [[id number]]   amLODipine -valsartan  (EXFORGE ) 10-320 MG tablet    Sig: Take 1 tablet by mouth daily.    Dispense:  90 tablet    Refill:  3    Supervising Provider:   LONNI SLAIN  [[id number]]     Signed, Reche GORMAN Finder, NP  12/13/2023 7:01 PM    [facility]

## 2023-12-14 LAB — MULTIPLE MYELOMA PANEL, SERUM
Albumin SerPl Elph-Mcnc: 3.1 g/dL (ref 2.9–4.4)
Albumin/Glob SerPl: 0.8 (ref 0.7–1.7)
Alpha 1: 0.4 g/dL (ref 0.0–0.4)
Alpha2 Glob SerPl Elph-Mcnc: 1.1 g/dL — ABNORMAL HIGH (ref 0.4–1.0)
B-Globulin SerPl Elph-Mcnc: 1.4 g/dL — ABNORMAL HIGH (ref 0.7–1.3)
Gamma Glob SerPl Elph-Mcnc: 1.3 g/dL (ref 0.4–1.8)
Globulin, Total: 4.1 g/dL — ABNORMAL HIGH (ref 2.2–3.9)
IgA: 415 mg/dL (ref 64–422)
IgG (Immunoglobin G), Serum: 1527 mg/dL (ref 586–1602)
IgM (Immunoglobulin M), Srm: 28 mg/dL (ref 26–217)
Total Protein ELP: 7.2 g/dL (ref 6.0–8.5)

## 2023-12-21 ENCOUNTER — Ambulatory Visit: Payer: Self-pay | Admitting: Nurse Practitioner

## 2023-12-24 ENCOUNTER — Telehealth: Payer: Self-pay

## 2023-12-24 NOTE — Telephone Encounter (Signed)
 Per Mayme Silversmith, NP and Dr. Autumn, the following was shared with patient's daughter who agree to share information with the patient:   Patient's labs are stable, iron  in normal range. Continue same plan and keep November appts.  Verified the appt in November with daughter, who agreed to have her mother attend.

## 2024-01-02 DIAGNOSIS — H16223 Keratoconjunctivitis sicca, not specified as Sjogren's, bilateral: Secondary | ICD-10-CM | POA: Diagnosis not present

## 2024-01-10 DIAGNOSIS — H16223 Keratoconjunctivitis sicca, not specified as Sjogren's, bilateral: Secondary | ICD-10-CM | POA: Diagnosis not present

## 2024-01-13 ENCOUNTER — Other Ambulatory Visit: Payer: Self-pay | Admitting: Internal Medicine

## 2024-01-14 ENCOUNTER — Ambulatory Visit (INDEPENDENT_AMBULATORY_CARE_PROVIDER_SITE_OTHER)

## 2024-01-14 DIAGNOSIS — I10 Essential (primary) hypertension: Secondary | ICD-10-CM

## 2024-01-15 ENCOUNTER — Ambulatory Visit (HOSPITAL_BASED_OUTPATIENT_CLINIC_OR_DEPARTMENT_OTHER): Payer: Self-pay | Admitting: Family

## 2024-01-17 ENCOUNTER — Telehealth: Payer: Self-pay | Admitting: Radiology

## 2024-01-17 NOTE — Telephone Encounter (Signed)
 Copied from CRM [id number]. Topic: Clinical - Medication Question >> Jan 17, 2024 12:12 PM [name] wrote: Reason for CRM: Wendy Torres from [employer] drug interactions between HYDROcodone -acetaminophen  (NORCO/VICODIN) 5-325 MG tablet, ALPRAZolam  (XANAX ) 0.25 MG tablet, solifenacin  (VESICARE ) 10 MG tablet and traMADol  (ULTRAM ) 50 MG tablet can cause cognitive decline and confusion, wants to know if provider wants to switch to alternative medications. Her call back number is [phone] >> Jan 17, 2024 12:25 PM [name] wrote: Medications can also cause falls

## 2024-01-20 ENCOUNTER — Other Ambulatory Visit: Payer: Self-pay | Admitting: Internal Medicine

## 2024-01-20 ENCOUNTER — Encounter: Payer: Self-pay | Admitting: Internal Medicine

## 2024-01-20 ENCOUNTER — Encounter: Payer: Self-pay | Admitting: Cardiology

## 2024-01-21 MED ORDER — FUROSEMIDE 40 MG PO TABS: 40.0000 mg | ORAL_TABLET | Freq: Every day | ORAL | 3 refills | Status: DC

## 2024-01-21 MED ORDER — POTASSIUM CHLORIDE CRYS ER 20 MEQ PO TBCR: 20.0000 meq | EXTENDED_RELEASE_TABLET | Freq: Every day | ORAL | 3 refills | Status: AC

## 2024-01-24 NOTE — Telephone Encounter (Signed)
 Called and relayed to pharmacist no change in medication needed

## 2024-01-24 NOTE — Telephone Encounter (Signed)
 Ok to take all ,except the patient know she does not take the tramadol  at the same time at the hydrocodone      thanks

## 2024-01-26 ENCOUNTER — Other Ambulatory Visit: Payer: Self-pay | Admitting: Internal Medicine

## 2024-02-05 DIAGNOSIS — H16223 Keratoconjunctivitis sicca, not specified as Sjogren's, bilateral: Secondary | ICD-10-CM | POA: Diagnosis not present

## 2024-02-05 DIAGNOSIS — H43819 Vitreous degeneration, unspecified eye: Secondary | ICD-10-CM | POA: Diagnosis not present

## 2024-03-03 ENCOUNTER — Inpatient Hospital Stay (HOSPITAL_COMMUNITY)
Admission: EM | Admit: 2024-03-03 | Discharge: 2024-03-07 | DRG: 291 | Disposition: A | Discharge status: Home Health | Attending: Internal Medicine | Admitting: Internal Medicine

## 2024-03-03 ENCOUNTER — Encounter (HOSPITAL_COMMUNITY): Payer: Self-pay | Admitting: Internal Medicine

## 2024-03-03 ENCOUNTER — Emergency Department (HOSPITAL_COMMUNITY)

## 2024-03-03 ENCOUNTER — Other Ambulatory Visit: Payer: Self-pay

## 2024-03-03 ENCOUNTER — Ambulatory Visit: Payer: Self-pay | Admitting: Pulmonary Disease

## 2024-03-03 DIAGNOSIS — Z9841 Cataract extraction status, right eye: Secondary | ICD-10-CM

## 2024-03-03 DIAGNOSIS — G4700 Insomnia, unspecified: Secondary | ICD-10-CM | POA: Diagnosis present

## 2024-03-03 DIAGNOSIS — Z9981 Dependence on supplemental oxygen: Secondary | ICD-10-CM

## 2024-03-03 DIAGNOSIS — Z8673 Personal history of transient ischemic attack (TIA), and cerebral infarction without residual deficits: Secondary | ICD-10-CM

## 2024-03-03 DIAGNOSIS — J9621 Acute and chronic respiratory failure with hypoxia: Secondary | ICD-10-CM | POA: Diagnosis not present

## 2024-03-03 DIAGNOSIS — I5033 Acute on chronic diastolic (congestive) heart failure: Secondary | ICD-10-CM | POA: Diagnosis present

## 2024-03-03 DIAGNOSIS — I10 Essential (primary) hypertension: Secondary | ICD-10-CM | POA: Diagnosis present

## 2024-03-03 DIAGNOSIS — Z801 Family history of malignant neoplasm of trachea, bronchus and lung: Secondary | ICD-10-CM

## 2024-03-03 DIAGNOSIS — F419 Anxiety disorder, unspecified: Secondary | ICD-10-CM | POA: Diagnosis present

## 2024-03-03 DIAGNOSIS — J810 Acute pulmonary edema: Secondary | ICD-10-CM

## 2024-03-03 DIAGNOSIS — J441 Chronic obstructive pulmonary disease with (acute) exacerbation: Secondary | ICD-10-CM | POA: Diagnosis present

## 2024-03-03 DIAGNOSIS — I110 Hypertensive heart disease with heart failure: Principal | ICD-10-CM | POA: Diagnosis present

## 2024-03-03 DIAGNOSIS — M810 Age-related osteoporosis without current pathological fracture: Secondary | ICD-10-CM | POA: Diagnosis present

## 2024-03-03 DIAGNOSIS — Z79899 Other long term (current) drug therapy: Secondary | ICD-10-CM

## 2024-03-03 DIAGNOSIS — Z87891 Personal history of nicotine dependence: Secondary | ICD-10-CM

## 2024-03-03 DIAGNOSIS — E785 Hyperlipidemia, unspecified: Secondary | ICD-10-CM | POA: Diagnosis present

## 2024-03-03 DIAGNOSIS — Z6833 Body mass index (BMI) 33.0-33.9, adult: Secondary | ICD-10-CM

## 2024-03-03 DIAGNOSIS — Z23 Encounter for immunization: Secondary | ICD-10-CM

## 2024-03-03 DIAGNOSIS — Z8619 Personal history of other infectious and parasitic diseases: Secondary | ICD-10-CM

## 2024-03-03 DIAGNOSIS — G894 Chronic pain syndrome: Secondary | ICD-10-CM | POA: Diagnosis present

## 2024-03-03 DIAGNOSIS — Z7982 Long term (current) use of aspirin: Secondary | ICD-10-CM

## 2024-03-03 DIAGNOSIS — D509 Iron deficiency anemia, unspecified: Secondary | ICD-10-CM | POA: Diagnosis present

## 2024-03-03 DIAGNOSIS — I517 Cardiomegaly: Secondary | ICD-10-CM

## 2024-03-03 DIAGNOSIS — E66811 Obesity, class 1: Secondary | ICD-10-CM | POA: Diagnosis present

## 2024-03-03 DIAGNOSIS — Z888 Allergy status to other drugs, medicaments and biological substances status: Secondary | ICD-10-CM

## 2024-03-03 DIAGNOSIS — Z9842 Cataract extraction status, left eye: Secondary | ICD-10-CM

## 2024-03-03 DIAGNOSIS — K219 Gastro-esophageal reflux disease without esophagitis: Secondary | ICD-10-CM | POA: Diagnosis present

## 2024-03-03 DIAGNOSIS — Z96643 Presence of artificial hip joint, bilateral: Secondary | ICD-10-CM | POA: Diagnosis present

## 2024-03-03 DIAGNOSIS — Z8249 Family history of ischemic heart disease and other diseases of the circulatory system: Secondary | ICD-10-CM

## 2024-03-03 LAB — BASIC METABOLIC PANEL WITH GFR
Anion gap: 10 (ref 5–15)
BUN: 14 mg/dL (ref 8–23)
CO2: 30 mmol/L (ref 22–32)
Calcium: 10.6 mg/dL — ABNORMAL HIGH (ref 8.9–10.3)
Chloride: 99 mmol/L (ref 98–111)
Creatinine, Ser: 0.86 mg/dL (ref 0.44–1.00)
GFR, Estimated: >60 mL/min (ref >60)
Glucose, Bld: 140 mg/dL — ABNORMAL HIGH (ref 70–99)
Potassium: 3.9 mmol/L (ref 3.5–5.1)
Sodium: 138 mmol/L (ref 135–145)

## 2024-03-03 LAB — CBC
HCT: 32.3 % — ABNORMAL LOW (ref 36.0–46.0)
Hemoglobin: 9.7 g/dL — ABNORMAL LOW (ref 12.0–15.0)
MCH: 26 pg (ref 26.0–34.0)
MCHC: 30 g/dL (ref 30.0–36.0)
MCV: 86.6 fL (ref 80.0–100.0)
Platelets: 309 K/uL (ref 150–400)
RBC: 3.73 MIL/uL — ABNORMAL LOW (ref 3.87–5.11)
RDW: 14.7 % (ref 11.5–15.5)
WBC: 7.8 K/uL (ref 4.0–10.5)
nRBC: 0 % (ref 0.0–0.2)

## 2024-03-03 LAB — PRO BRAIN NATRIURETIC PEPTIDE: Pro Brain Natriuretic Peptide: 148 pg/mL (ref <300.0)

## 2024-03-03 MED ORDER — ASPIRIN 81 MG PO TBEC
81.0000 mg | DELAYED_RELEASE_TABLET | Freq: Every day | ORAL | Status: DC
  Administered 2024-03-04 – 2024-03-07 (×4): 81 mg via ORAL
  Filled 2024-03-03 (×4): qty 1

## 2024-03-03 MED ORDER — FUROSEMIDE 10 MG/ML IJ SOLN
40.0000 mg | Freq: Once | INTRAMUSCULAR | Status: AC
  Administered 2024-03-03: 40 mg via INTRAVENOUS
  Filled 2024-03-03: qty 4

## 2024-03-03 MED ORDER — ALBUTEROL SULFATE (2.5 MG/3ML) 0.083% IN NEBU: 2.5000 mg | INHALATION_SOLUTION | RESPIRATORY_TRACT | Status: DC | PRN

## 2024-03-03 MED ORDER — AMLODIPINE BESYLATE-VALSARTAN 10-320 MG PO TABS: 1.0000 | ORAL_TABLET | Freq: Every day | ORAL | Status: DC

## 2024-03-03 MED ORDER — POLYSACCHARIDE IRON COMPLEX 150 MG PO CAPS
150.0000 mg | ORAL_CAPSULE | Freq: Every day | ORAL | Status: DC
  Administered 2024-03-04 – 2024-03-07 (×4): 150 mg via ORAL
  Filled 2024-03-03 (×4): qty 1

## 2024-03-03 MED ORDER — FESOTERODINE FUMARATE ER 4 MG PO TB24
4.0000 mg | ORAL_TABLET | Freq: Every day | ORAL | Status: DC
  Administered 2024-03-04 – 2024-03-07 (×4): 4 mg via ORAL
  Filled 2024-03-03 (×4): qty 1

## 2024-03-03 MED ORDER — PREDNISONE 20 MG PO TABS
40.0000 mg | ORAL_TABLET | Freq: Every day | ORAL | Status: DC
  Administered 2024-03-04 – 2024-03-07 (×4): 40 mg via ORAL
  Filled 2024-03-03 (×4): qty 2

## 2024-03-03 MED ORDER — ALBUTEROL SULFATE (2.5 MG/3ML) 0.083% IN NEBU
10.0000 mg/h | INHALATION_SOLUTION | Freq: Once | RESPIRATORY_TRACT | Status: AC
  Administered 2024-03-03: 10 mg/h via RESPIRATORY_TRACT
  Filled 2024-03-03: qty 3

## 2024-03-03 MED ORDER — POLYETHYLENE GLYCOL 3350 17 G PO PACK: 17.0000 g | PACK | Freq: Every day | ORAL | Status: DC | PRN

## 2024-03-03 MED ORDER — IPRATROPIUM BROMIDE 0.02 % IN SOLN
0.5000 mg | Freq: Once | RESPIRATORY_TRACT | Status: AC
  Administered 2024-03-03: 0.5 mg via RESPIRATORY_TRACT
  Filled 2024-03-03: qty 2.5

## 2024-03-03 MED ORDER — ACETAMINOPHEN 650 MG RE SUPP: 650.0000 mg | Freq: Four times a day (QID) | RECTAL | Status: DC | PRN

## 2024-03-03 MED ORDER — GABAPENTIN 300 MG PO CAPS
600.0000 mg | ORAL_CAPSULE | Freq: Three times a day (TID) | ORAL | Status: DC
  Administered 2024-03-03 – 2024-03-07 (×11): 600 mg via ORAL
  Filled 2024-03-03 (×11): qty 2

## 2024-03-03 MED ORDER — HYDRALAZINE HCL 50 MG PO TABS
100.0000 mg | ORAL_TABLET | Freq: Three times a day (TID) | ORAL | Status: DC
  Administered 2024-03-03 – 2024-03-07 (×11): 100 mg via ORAL
  Filled 2024-03-03 (×11): qty 2

## 2024-03-03 MED ORDER — ONDANSETRON HCL 4 MG/2ML IJ SOLN: 4.0000 mg | Freq: Four times a day (QID) | INTRAMUSCULAR | Status: DC | PRN

## 2024-03-03 MED ORDER — METHYLPREDNISOLONE SODIUM SUCC 125 MG IJ SOLR
125.0000 mg | Freq: Once | INTRAMUSCULAR | Status: AC
  Administered 2024-03-03: 125 mg via INTRAVENOUS
  Filled 2024-03-03: qty 2

## 2024-03-03 MED ORDER — AMLODIPINE BESYLATE 10 MG PO TABS
10.0000 mg | ORAL_TABLET | Freq: Every day | ORAL | Status: DC
  Administered 2024-03-04 – 2024-03-07 (×4): 10 mg via ORAL
  Filled 2024-03-03 (×4): qty 1

## 2024-03-03 MED ORDER — POTASSIUM CHLORIDE CRYS ER 20 MEQ PO TBCR
20.0000 meq | EXTENDED_RELEASE_TABLET | Freq: Every day | ORAL | Status: DC
  Administered 2024-03-04 – 2024-03-07 (×4): 20 meq via ORAL
  Filled 2024-03-03 (×4): qty 1

## 2024-03-03 MED ORDER — IRBESARTAN 300 MG PO TABS
300.0000 mg | ORAL_TABLET | Freq: Every day | ORAL | Status: DC
  Administered 2024-03-04 – 2024-03-07 (×4): 300 mg via ORAL
  Filled 2024-03-03 (×4): qty 1

## 2024-03-03 MED ORDER — TRAZODONE HCL 50 MG PO TABS
50.0000 mg | ORAL_TABLET | Freq: Every day | ORAL | Status: DC
  Administered 2024-03-03 – 2024-03-06 (×4): 50 mg via ORAL
  Filled 2024-03-03 (×4): qty 1

## 2024-03-03 MED ORDER — NYSTATIN 100000 UNIT/ML MT SUSP: 5.0000 mL | Freq: Every day | OROMUCOSAL | Status: DC | PRN

## 2024-03-03 MED ORDER — ACETAMINOPHEN 325 MG PO TABS
650.0000 mg | ORAL_TABLET | Freq: Four times a day (QID) | ORAL | Status: DC | PRN
  Administered 2024-03-06: 650 mg via ORAL
  Filled 2024-03-03: qty 2

## 2024-03-03 MED ORDER — ONDANSETRON HCL 4 MG PO TABS: 4.0000 mg | ORAL_TABLET | Freq: Four times a day (QID) | ORAL | Status: DC | PRN

## 2024-03-03 MED ORDER — ALPRAZOLAM 0.25 MG PO TABS: 0.2500 mg | ORAL_TABLET | Freq: Two times a day (BID) | ORAL | Status: DC | PRN

## 2024-03-03 MED ORDER — CLONIDINE HCL 0.2 MG PO TABS
0.2000 mg | ORAL_TABLET | Freq: Two times a day (BID) | ORAL | Status: DC
  Administered 2024-03-03 – 2024-03-07 (×8): 0.2 mg via ORAL
  Filled 2024-03-03 (×8): qty 1

## 2024-03-03 MED ORDER — PRAVASTATIN SODIUM 40 MG PO TABS
40.0000 mg | ORAL_TABLET | Freq: Every day | ORAL | Status: DC
  Administered 2024-03-04 – 2024-03-06 (×3): 40 mg via ORAL
  Filled 2024-03-03 (×3): qty 1

## 2024-03-03 MED ORDER — GUAIFENESIN ER 600 MG PO TB12
600.0000 mg | ORAL_TABLET | Freq: Two times a day (BID) | ORAL | Status: DC
  Administered 2024-03-03 – 2024-03-07 (×8): 600 mg via ORAL
  Filled 2024-03-03 (×7): qty 1

## 2024-03-03 MED ORDER — PANTOPRAZOLE SODIUM 40 MG PO TBEC
40.0000 mg | DELAYED_RELEASE_TABLET | Freq: Every day | ORAL | Status: DC
  Administered 2024-03-04 – 2024-03-07 (×4): 40 mg via ORAL
  Filled 2024-03-03 (×4): qty 1

## 2024-03-03 MED ORDER — INFLUENZA VAC SPLIT HIGH-DOSE 0.5 ML IM SUSY
0.5000 mL | PREFILLED_SYRINGE | INTRAMUSCULAR | Status: AC
  Administered 2024-03-04: 0.5 mL via INTRAMUSCULAR
  Filled 2024-03-03: qty 0.5

## 2024-03-03 MED ORDER — FUROSEMIDE 10 MG/ML IJ SOLN
20.0000 mg | Freq: Two times a day (BID) | INTRAMUSCULAR | Status: DC
  Administered 2024-03-03 – 2024-03-05 (×4): 20 mg via INTRAVENOUS
  Filled 2024-03-03 (×4): qty 2

## 2024-03-03 MED ORDER — TRAMADOL HCL 50 MG PO TABS
50.0000 mg | ORAL_TABLET | Freq: Four times a day (QID) | ORAL | Status: DC | PRN
  Administered 2024-03-03: 50 mg via ORAL
  Filled 2024-03-03: qty 1

## 2024-03-03 NOTE — H&P (Signed)
 History and Physical    Patient: Wendy Torres FMW:[id number] DOB: 1941/01/23 DOA: 03/03/2024 DOS: the patient was seen and examined on 03/03/2024 PCP: [name], MD  Patient coming from: Home  Chief Complaint:  Chief Complaint  Patient presents with   Shortness of Breath   HPI: Wendy Torres is a 83 y.o. female with medical history significant of allergic rhinitis, anemia, anxiety, osteoarthritis, asthma, cataracts, grade 2 diastolic dysfunction, chronic pain syndrome, lower back pain, COPD, urinary frequency, overactive bladder, GERD, hiatal hernia, history of H. pylori gastritis, hyperlipidemia, hypersomnia, hypertension, class I obesity (improved from class III), osteoporosis, recurrent palpitations, history of TIA, syncopal episode, COPD with chronic respiratory failure with hypoxia on home oxygen  at 4 LPM who presented to the emergency department complaints of progressively worsening dyspnea, associated with wheezing for the past 2 days despite increasing her [place] O2 to 6 LPM. He denied fever, chills, rhinorrhea, sore throat, wheezing or hemoptysis. No chest pain, palpitations, diaphoresis, PND, orthopnea or pitting edema of the lower extremities. No abdominal pain, nausea, emesis, diarrhea, constipation, melena or hematochezia. No flank pain, dysuria, frequency or hematuria.  No polyuria, polydipsia, polyphagia or blurred vision.   Lab work: CBC showed a white count of 7.8, hemoglobin 9.7 g/dL platelets 690.  BMP showed a glucose of 140 and calcium of 10.6 mg/dL, renal function and the rest of the electrolytes were normal.  proBNP was 148.0 pg/mL.  Imaging: 1 view chest radiograph showing cardiomegaly with increase basilar infiltrates most likely pulmonary edema.   ED course: Initial vital signs were temperature 98 F, pulse 97, respiration 18, BP 110/86 mmHg and O2 sat 99% on room air.  The patient received methylprednisolone  125 mg IVP, and albuterol  10+ ipratropium 0.5 mg continuous  neb and furosemide  40 mg IVP.  Review of Systems: As mentioned in the history of present illness. All other systems reviewed and are negative. Past Medical History:  Diagnosis Date   ALLERGIC RHINITIS 12/09/2006   ANEMIA 01/14/2009   Anxiety    Arthritis    ASTHMA 12/09/2006   Cataract    bilateral -removed   CHF (congestive heart failure) (HCC)    Chronic pain syndrome 02/16/2009   COPD 12/09/2006   DEGENERATIVE JOINT DISEASE 12/09/2006   DIZZINESS 03/30/2010   DYSPNEA 01/27/2010   with heavy exertion   FREQUENCY, URINARY 03/30/2010   GERD 12/09/2006   HELICOBACTER PYLORI GASTRITIS, HX OF 12/09/2006   HIATAL HERNIA 03/01/2010   HYPERLIPIDEMIA 04/18/2007   HYPERSOMNIA 08/31/2008   HYPERTENSION 12/09/2006   INSOMNIA-SLEEP DISORDER-UNSPEC 11/05/2008   LOW BACK PAIN 12/09/2006   Morbid obesity (HCC) 12/09/2006   Nonspecific (abnormal) findings on radiological and other examination of body structure 11/05/2008   OSTEOPOROSIS 12/09/2006   OVERACTIVE BLADDER 04/14/2008   PALPITATIONS, RECURRENT 01/27/2010   Stroke (HCC)    pt. stated light one years ago   SYNCOPE 11/05/2008   TRANSIENT ISCHEMIC ATTACK, HX OF 12/09/2006   Past Surgical History:  Procedure Laterality Date   BIOPSY  01/18/2023   Procedure: BIOPSY;  Surgeon: Aneita Gwendlyn DASEN, MD;  Location: [facility];  Service: Gastroenterology;;   COLONOSCOPY     ESOPHAGOGASTRODUODENOSCOPY (EGD) WITH PROPOFOL  N/A 01/18/2023   Procedure: ESOPHAGOGASTRODUODENOSCOPY (EGD) WITH PROPOFOL ;  Surgeon: Aneita Gwendlyn DASEN, MD;  Location: [facility];  Service: Gastroenterology;  Laterality: N/A;   HOT HEMOSTASIS N/A 01/18/2023   Procedure: HOT HEMOSTASIS (ARGON PLASMA COAGULATION/BICAP);  Surgeon: Aneita Gwendlyn DASEN, MD;  Location: [facility];  Service: Gastroenterology;  Laterality: N/A;  LUMBAR DISC SURGERY     TONSILLECTOMY     as a child   TOTAL HIP ARTHROPLASTY Right 11/06/2012   Procedure: RIGHT TOTAL HIP ARTHROPLASTY WITH  ACETABULAR AUTOGRAFT;  Surgeon: Dempsey LULLA Moan, MD;  Location: [facility];  Service: Orthopedics;  Laterality: Right;   TOTAL HIP ARTHROPLASTY Left 02/18/2014   Procedure: LEFT TOTAL HIP ARTHROPLASTY;  Surgeon: Dempsey Moan LULLA, MD;  Location: [facility];  Service: Orthopedics;  Laterality: Left;   TUBAL LIGATION     Social History:  reports that she quit smoking about 51 years ago. Her smoking use included cigarettes. She started smoking about 76 years ago. She has a 12.5 pack-year smoking history. She has never used smokeless tobacco. She reports that she does not drink alcohol  and does not use drugs.  Allergies  Allergen Reactions   Chlorthalidone Shortness Of Breath   Benazepril  Swelling    Family History  Problem Relation Age of Onset   Heart disease Mother        Had pacemaker   Lung cancer Father    Hypertension Brother    Diabetes Neg Hx    Colon cancer Neg Hx    Colon polyps Neg Hx    Kidney disease Neg Hx    Gallbladder disease Neg Hx    Esophageal cancer Neg Hx    Allergic rhinitis Neg Hx    Angioedema Neg Hx    Asthma Neg Hx    Atopy Neg Hx    Eczema Neg Hx    Immunodeficiency Neg Hx    Urticaria Neg Hx     Prior to Admission medications   Medication Sig Start Date End Date Taking? Authorizing Provider  prednisoLONE acetate (PRED FORTE) 1 % ophthalmic suspension Place 1 drop into both eyes 3 (three) times daily. 01/28/24  Yes [provider]  acetaminophen  (TYLENOL ) 325 MG tablet Take 2 tablets (650 mg total) by mouth every 6 (six) hours as needed for mild pain (or Fever >/= 101). 02/19/14   Perkins, Alexzandrew L, PA-C  albuterol  (VENTOLIN  HFA) 108 (90 Base) MCG/ACT inhaler Inhale 2 puffs into the lungs every 4 (four) hours as needed for wheezing or shortness of breath. Patient not taking: Reported on 12/13/2023 05/01/23   [name], MD  ALPRAZolam  (XANAX ) 0.25 MG tablet Take 1 tablet (0.25 mg total) by mouth 2 (two) times daily as needed. 11/05/23   [name], MD  amLODipine -valsartan  (EXFORGE ) 10-320 MG tablet Take 1 tablet by mouth daily. 12/13/23   Vannie Reche RAMAN, NP  aspirin  EC 81 MG tablet Take 1 tablet (81 mg total) by mouth daily. Swallow whole. 01/22/23   Tobie Yetta HERO, MD  cloNIDine  (CATAPRES ) 0.2 MG tablet TAKE 1 TABLET BY MOUTH TWICE  DAILY 01/28/24   [name], MD  Fexofenadine HCl (MUCINEX  ALLERGY PO) Take 1 tablet by mouth as needed (congestion).    [provider]  Fluticasone-Umeclidin-Vilant (TRELEGY ELLIPTA ) 200-62.5-25 MCG/ACT AEPB Inhale 1 Inhalation into the lungs daily. 10/15/23   Mannam, Praveen, MD  furosemide  (LASIX ) 40 MG tablet Take 1 tablet (40 mg total) by mouth daily. 01/21/24   Tobb, Kardie, DO  gabapentin  (NEURONTIN ) 600 MG tablet TAKE 1 TABLET BY MOUTH 3 TIMES  DAILY 01/28/24   [name], MD  guaiFENesin  (MUCINEX ) 600 MG 12 hr tablet Take 1 tablet (600 mg total) by mouth 2 (two) times daily. Patient taking differently: Take 600 mg by mouth daily. 01/19/23   Tobie Yetta HERO, MD  hydrALAZINE  (APRESOLINE ) 100 MG tablet TAKE 1 TABLET BY MOUTH 3 TIMES  DAILY 10/29/23   [name], MD  iron  polysaccharides (NIFEREX) 150 MG capsule TAKE 1 CAPSULE BY MOUTH EVERY DAY 11/12/23   [name], MD  lovastatin  (MEVACOR ) 40 MG tablet TAKE 1 TABLET BY MOUTH AT  BEDTIME 11/26/23   [name], MD  meclizine  (ANTIVERT ) 12.5 MG tablet TAKE 1 TABLET BY MOUTH THREE TIMES A DAY AS NEEDED FOR DIZZINESS 06/04/23   [name], MD  nystatin  (MYCOSTATIN ) 100000 UNIT/ML suspension Swish and spit 5mL two times daily after using Symbicort . 11/09/22   [name], MD  pantoprazole  (PROTONIX ) 40 MG tablet TAKE 1 TABLET BY MOUTH DAILY 11/15/23   [name], MD  polyethylene glycol (MIRALAX  / GLYCOLAX ) 17 g packet Take 17 g by mouth daily as needed for mild constipation or moderate constipation.    [provider]  potassium chloride  SA (KLOR-CON  M20) 20 MEQ tablet Take 1 tablet (20 mEq total) by mouth daily. 01/21/24    Tobb, Kardie, DO  predniSONE  (DELTASONE ) 10 MG tablet 3 tabs by mouth per day for 3 days,2tabs per day for 3 days,1tab per day for 3 days 10/25/23   [name], MD  solifenacin  (VESICARE ) 10 MG tablet TAKE 1 TABLET BY MOUTH EVERY DAY 10/29/23   [name], MD  tiZANidine  (ZANAFLEX ) 4 MG tablet TAKE 1 TABLET BY MOUTH EVERY 6 HOURS AS NEEDED FOR MUSCLE SPASMS. Patient not taking: Reported on 12/13/2023 10/18/23   [name], MD  traMADol  (ULTRAM ) 50 MG tablet Take 1 tablet (50 mg total) by mouth every 6 (six) hours as needed. for pain 11/05/23   [name], MD  traZODone  (DESYREL ) 50 MG tablet TAKE 1/2 TO 1 TABLET BY MOUTH AT BEDTIME AS NEEDED FOR SLEEP 11/05/23   [name], MD  triamcinolone  cream (KENALOG ) 0.1 % Apply topically 2 (two) times daily. 11/05/23   [name], MD    Physical Exam: Vitals:   03/03/24 1219  BP: 110/86  Pulse: 97  Resp: 18  SpO2: 99%   Physical Exam Vitals and nursing note reviewed.  Constitutional:      General: She is awake. She is not in acute distress.    Appearance: She is obese. She is ill-appearing.     Interventions: Nasal cannula in place.  HENT:     Head: Normocephalic.     Nose: No rhinorrhea.     Mouth/Throat:     Mouth: Mucous membranes are moist.  Eyes:     General: No scleral icterus.    Pupils: Pupils are equal, round, and reactive to light.  Neck:     Vascular: No JVD.  Cardiovascular:     Rate and Rhythm: Normal rate and regular rhythm.     Heart sounds: S1 normal and S2 normal.  Pulmonary:     Effort: No tachypnea, accessory muscle usage or respiratory distress.     Breath sounds: Examination of the right-lower field reveals rales. Examination of the left-lower field reveals rales. Decreased breath sounds, wheezing and rales present. No rhonchi.  Chest:  Breasts:    Breasts are symmetrical.     Right: Tenderness present.  Abdominal:     General: Bowel sounds are normal. There is no distension.     Palpations: Abdomen  is soft.     Tenderness: There is no abdominal tenderness. There is no right CVA tenderness.  Musculoskeletal:  Cervical back: Neck supple.     Right lower leg: No edema.     Left lower leg: No edema.  Skin:    General: Skin is warm and dry.  Neurological:     General: No focal deficit present.     Mental Status: She is alert and oriented to person, place, and time.  Psychiatric:        Mood and Affect: Mood normal.        Behavior: Behavior normal. Behavior is cooperative.     Data Reviewed:  Results are pending, will review when available. 04/06/2023 echocardiogram report. IMPRESSIONS:   1. Left ventricular ejection fraction, by estimation, is 70 to 75%. The  left ventricle has hyperdynamic function. The left ventricle has no  regional wall motion abnormalities. There is moderate asymmetric left  ventricular hypertrophy of the basal-septal   segment. Left ventricular diastolic parameters are consistent with Grade  II diastolic dysfunction (pseudonormalization). Elevated left atrial  pressure.   2. Right ventricular systolic function is normal. The right ventricular  size is normal. There is moderately elevated pulmonary artery systolic  pressure. The estimated right ventricular systolic pressure is 59.6 mmHg.   3. The mitral valve is normal in structure. Trivial mitral valve  regurgitation. No evidence of mitral stenosis.   4. The aortic valve was not well visualized. Aortic valve regurgitation  is not visualized. Aortic valve sclerosis is present, with no evidence of  aortic valve stenosis.   5. The inferior vena cava is normal in size with greater than 50%  respiratory variability, suggesting right atrial pressure of 3 mmHg.   EKG: Vent. rate 90 BPM PR interval * ms QRS duration 95 ms QT/QTcB 371/454 ms P-R-T axes * 6 90 Normal sinus rhythm Abnormal R-wave progression, early transition Probable left ventricular hypertrophy  Assessment and Plan: Principal  Problem:   Acute on chronic respiratory failure with hypoxia (HCC) In the setting of:   COPD with acute exacerbation (HCC) And:   Acute on chronic diastolic congestive heart failure (HCC)  Observation/telemetry. Continue supplemental oxygen .   Methylprednisolone  125 mg IVP x1. Followed by prednisone  40 mg p.o. daily in a.m. Scheduled and as needed bronchodilators. Continue guaifenesin  600 mg p.o. twice daily. Sodium and fluid restriction. Continue furosemide  20 mg IVP twice daily Monitor daily weights, intake and output. Continue valsartan  320 mg p.o. daily or formulary equivalent. No beta-blocker due to acute decompensation. Follow-up CBC and chemistry in the morning.  Check transthoracic echocardiogram in a.m.  Active Problems:   Dyslipidemia, goal LDL below 130 Continue lovastatin  40 mg p.o. daily or formulary equivalent.    Iron  deficiency anemia Continue iron  supplementation. Monitor hematocrit and hemoglobin.    Essential hypertension Continue ARB and furosemide  as above. Continue amlodipine  10 mg p.o. daily. Continue hydralazine  100 mg p.o. 3 times daily. Continue clonidine  0.2 mg p.o. twice daily.    GERD Pantoprazole  40 mg p.o. daily.    Insomnia Continue trazodone  50 mg p.o. bedtime.    Anxiety Continue alprazolam  0.25 mg p.o. twice daily as needed.    Hypercalcemia This is a known chronic issue. Monitor calcium level. Follow-up with primary care provider.    Advance Care Planning:   Code Status: Full Code   Consults:   Family Communication:  Her daughters were at bedside.  Questions were answered.  Severity of Illness: The appropriate patient status for this patient is OBSERVATION. Observation status is judged to be reasonable and necessary in order to provide the required intensity  of service to ensure the patient's safety. The patient's presenting symptoms, physical exam findings, and initial radiographic and laboratory data in the context of their  medical condition is felt to place them at decreased risk for further clinical deterioration. Furthermore, it is anticipated that the patient will be medically stable for discharge from the hospital within 2 midnights of admission.   Author: Alm Dorn Castor, MD 03/03/2024 1:35 PM  For on call review [email or web address].   This document was prepared using Dragon voice recognition software and may contain some unintended transcription errors.

## 2024-03-03 NOTE — Telephone Encounter (Signed)
 Noted.  Patient currently at [facility] ED.  Nothing further needed.

## 2024-03-03 NOTE — ED Triage Notes (Signed)
 Patient BIB EMS from home for [facility] x 2 days, normally on home oxygen  at 4L [place], arrived on 6L [place] after one duoneb tmt and sat was 88.

## 2024-03-03 NOTE — Telephone Encounter (Signed)
 Triaged was performed with Wendy Torres and both daughters via phone. One daughter is home with the patient.  Wendy Torres's O2 is in the high 80s today and is experiencing a decrease to 60s and SOB with exertion.   Was not seen when her O2 was 56-57 yesterday. States patient was winded at that time. Hx of CHF & COPD. Patient is on home oxygen  at 4 lpm via [place].  Wendy Torres denies lower leg edema, but states that it is difficult to assess d/t compression socks.  With family and Wendy Torres permission, EMS was dispatched via 911. Daughter was advised to unlock the door and gather Wendy Torres's medications for EMS to review. Requested they call once Wendy Torres is discharged to schedule f/u.  FYI Only or Action Required?: FYI only for provider: ED advised.  Patient is followed in Pulmonology for COPD, CHF, last seen on 02/22/2023 by Mannam, Praveen, MD.  Called Nurse Triage reporting Shortness of Breath.  Symptoms began yesterday.  Interventions attempted: Rescue inhaler.  Symptoms are: unchanged.  Triage Disposition: Call EMS 911 Now  Patient/caregiver understands and will follow disposition?: Yes Reason for Disposition  Sounds like a life-threatening emergency to the triager  Answer Assessment - Initial Assessment Questions RESPIRATORY STATUS: Describe your breathing? (e.g., wheezing, shortness of breath, unable to speak, severe coughing)      SOB with exertion  ONSET: When did this breathing problem begin?      03/02/24  PATTERN Does the difficult breathing come and go, or has it been constant since it started?      Constant, improves at rest  SEVERITY: How bad is your breathing? (e.g., mild, moderate, severe)      Severe with any level of exertion  RECURRENT SYMPTOM: Have you had difficulty breathing before? If Yes, ask: When was the last time? and What happened that time?      Chronic  CARDIAC HISTORY: Do you have any history of heart disease? (e.g., heart attack, angina, bypass surgery, angioplasty)       CHF  LUNG HISTORY: Do you have any history of lung disease?  (e.g., pulmonary embolus, asthma, emphysema)     COPD  O2 SATURATION MONITOR:  Do you use an oxygen  saturation monitor (pulse oximeter) at home? If Yes, ask: What is your reading (oxygen  level) today? What is your usual oxygen  saturation reading? (e.g., 95%)       50s-80s  Protocols used: Breathing Difficulty-A-AH  Past Medical History:  Diagnosis Date   ALLERGIC RHINITIS 12/09/2006   ANEMIA 01/14/2009   Anxiety    Arthritis    ASTHMA 12/09/2006   Cataract    bilateral -removed   CHF (congestive heart failure) (HCC)    Chronic pain syndrome 02/16/2009   COPD 12/09/2006   DEGENERATIVE JOINT DISEASE 12/09/2006   DIZZINESS 03/30/2010   DYSPNEA 01/27/2010   with heavy exertion   FREQUENCY, URINARY 03/30/2010   GERD 12/09/2006   HELICOBACTER PYLORI GASTRITIS, HX OF 12/09/2006   HIATAL HERNIA 03/01/2010   HYPERLIPIDEMIA 04/18/2007   HYPERSOMNIA 08/31/2008   HYPERTENSION 12/09/2006   INSOMNIA-SLEEP DISORDER-UNSPEC 11/05/2008   LOW BACK PAIN 12/09/2006   Morbid obesity (HCC) 12/09/2006   Nonspecific (abnormal) findings on radiological and other examination of body structure 11/05/2008   OSTEOPOROSIS 12/09/2006   OVERACTIVE BLADDER 04/14/2008   PALPITATIONS, RECURRENT 01/27/2010   Stroke (HCC)    Wendy Torres. stated light one years ago   SYNCOPE 11/05/2008   TRANSIENT ISCHEMIC ATTACK, HX OF 12/09/2006   Copied from CRM #[id number]. Topic: Clinical -  Red Word Triage >> Mar 03, 2024  8:54 AM Corean SAUNDERS wrote: Red Word that prompted transfer to Nurse Triage: Oxygen  levels in the 50's all day yesterday, back in the 80's today with increasing heart rate.

## 2024-03-03 NOTE — ED Provider Notes (Addendum)
 [place] EMERGENCY DEPARTMENT AT [facility] Provider Note   CSN: [id number] Arrival date & time: 03/03/24  1027     Patient presents with: Shortness of Breath   Wendy Torres is a 83 y.o. female.   HPI     83 year old female comes in with chief complaint of worsening shortness of breath.  Patient has history of chronic hypoxic respiratory failure due to COPD, with 4 L of constant oxygen  requirement.   Patient is accompanied by daughter.  According to the patient, she has had worsening shortness of breath over the last week.  She does have a new cough.  However, she does not have any new wheezing, fevers, chills.  According to the daughter, upon ambulation they have noted that patient's O2 sats drops into the 50s, which is unusual.  Typically the O2 sats will drop in the 70s.  Patient has reduced activity because of this.  Patient sleeps in a recliner, upright at night.  No worsening orthopnea.  No new swelling in the legs.  Prior to Admission medications   Medication Sig Start Date End Date Taking? Authorizing Provider  prednisoLONE acetate (PRED FORTE) 1 % ophthalmic suspension Place 1 drop into both eyes 3 (three) times daily. 01/28/24  Yes [provider]  acetaminophen  (TYLENOL ) 325 MG tablet Take 2 tablets (650 mg total) by mouth every 6 (six) hours as needed for mild pain (or Fever >/= 101). 02/19/14   Perkins, Alexzandrew L, PA-C  albuterol  (VENTOLIN  HFA) 108 (90 Base) MCG/ACT inhaler Inhale 2 puffs into the lungs every 4 (four) hours as needed for wheezing or shortness of breath. Patient not taking: Reported on 12/13/2023 05/01/23   [name], MD  ALPRAZolam  (XANAX ) 0.25 MG tablet Take 1 tablet (0.25 mg total) by mouth 2 (two) times daily as needed. 11/05/23   [name], MD  amLODipine -valsartan  (EXFORGE ) 10-320 MG tablet Take 1 tablet by mouth daily. 12/13/23   Vannie Reche RAMAN, NP  aspirin  EC 81 MG tablet Take 1 tablet (81 mg total) by mouth  daily. Swallow whole. 01/22/23   Tobie Yetta HERO, MD  cloNIDine  (CATAPRES ) 0.2 MG tablet TAKE 1 TABLET BY MOUTH TWICE  DAILY 01/28/24   [name], MD  Fexofenadine HCl (MUCINEX  ALLERGY PO) Take 1 tablet by mouth as needed (congestion).    [provider]  Fluticasone-Umeclidin-Vilant (TRELEGY ELLIPTA ) 200-62.5-25 MCG/ACT AEPB Inhale 1 Inhalation into the lungs daily. 10/15/23   Mannam, Praveen, MD  furosemide  (LASIX ) 40 MG tablet Take 1 tablet (40 mg total) by mouth daily. 01/21/24   Tobb, Kardie, DO  gabapentin  (NEURONTIN ) 600 MG tablet TAKE 1 TABLET BY MOUTH 3 TIMES  DAILY 01/28/24   [name], MD  guaiFENesin  (MUCINEX ) 600 MG 12 hr tablet Take 1 tablet (600 mg total) by mouth 2 (two) times daily. Patient taking differently: Take 600 mg by mouth daily. 01/19/23   Tobie Yetta HERO, MD  hydrALAZINE  (APRESOLINE ) 100 MG tablet TAKE 1 TABLET BY MOUTH 3 TIMES  DAILY 10/29/23   [name], MD  iron  polysaccharides (NIFEREX) 150 MG capsule TAKE 1 CAPSULE BY MOUTH EVERY DAY 11/12/23   [name], MD  lovastatin  (MEVACOR ) 40 MG tablet TAKE 1 TABLET BY MOUTH AT  BEDTIME 11/26/23   [name], MD  meclizine  (ANTIVERT ) 12.5 MG tablet TAKE 1 TABLET BY MOUTH THREE TIMES A DAY AS NEEDED FOR DIZZINESS 06/04/23   [name], MD  nystatin  (MYCOSTATIN ) 100000 UNIT/ML  suspension Swish and spit 5mL two times daily after using Symbicort . 11/09/22   [name], MD  pantoprazole  (PROTONIX ) 40 MG tablet TAKE 1 TABLET BY MOUTH DAILY 11/15/23   [name], MD  polyethylene glycol (MIRALAX  / GLYCOLAX ) 17 g packet Take 17 g by mouth daily as needed for mild constipation or moderate constipation.    [provider]  potassium chloride  SA (KLOR-CON  M20) 20 MEQ tablet Take 1 tablet (20 mEq total) by mouth daily. 01/21/24   Tobb, Kardie, DO  predniSONE  (DELTASONE ) 10 MG tablet 3 tabs by mouth per day for 3 days,2tabs per day for 3 days,1tab per day for 3 days 10/25/23   [name], MD  solifenacin   (VESICARE ) 10 MG tablet TAKE 1 TABLET BY MOUTH EVERY DAY 10/29/23   [name], MD  tiZANidine  (ZANAFLEX ) 4 MG tablet TAKE 1 TABLET BY MOUTH EVERY 6 HOURS AS NEEDED FOR MUSCLE SPASMS. Patient not taking: Reported on 12/13/2023 10/18/23   [name], MD  traMADol  (ULTRAM ) 50 MG tablet Take 1 tablet (50 mg total) by mouth every 6 (six) hours as needed. for pain 11/05/23   [name], MD  traZODone  (DESYREL ) 50 MG tablet TAKE 1/2 TO 1 TABLET BY MOUTH AT BEDTIME AS NEEDED FOR SLEEP 11/05/23   [name], MD  triamcinolone  cream (KENALOG ) 0.1 % Apply topically 2 (two) times daily. 11/05/23   [name], MD    Allergies: Chlorthalidone and Benazepril     Review of Systems  All other systems reviewed and are negative.   Updated Vital Signs BP 110/86 (BP Location: Left Arm)   Pulse 97   Resp 18   SpO2 99%   Physical Exam Vitals and nursing note reviewed.  Constitutional:      Appearance: She is well-developed.  HENT:     Head: Atraumatic.  Neck:     Vascular: No JVD.  Cardiovascular:     Rate and Rhythm: Normal rate.  Pulmonary:     Effort: Pulmonary effort is normal.     Breath sounds: Decreased breath sounds present. No wheezing, rhonchi or rales.  Musculoskeletal:     Cervical back: Normal range of motion and neck supple.     Right lower leg: No edema.     Left lower leg: No edema.  Skin:    General: Skin is warm and dry.  Neurological:     Mental Status: She is alert and oriented to person, place, and time.     (all labs ordered are listed, but only abnormal results are displayed) Labs Reviewed  BASIC METABOLIC PANEL WITH GFR - Abnormal; Notable for the following components:      Result Value   Glucose, Bld 140 (*)    Calcium 10.6 (*)    All other components within normal limits  CBC - Abnormal; Notable for the following components:   RBC 3.73 (*)    Hemoglobin 9.7 (*)    HCT 32.3 (*)    All other components within normal limits  PRO BRAIN NATRIURETIC PEPTIDE     EKG: EKG Interpretation Date/Time:  Monday March 03 2024 10:50:10 EST Ventricular Rate:  90 PR Interval:    QRS Duration:  95 QT Interval:  371 QTC Calculation: 454 R Axis:   6  Text Interpretation: Normal sinus rhythm Abnormal R-wave progression, early transition Probable left ventricular hypertrophy No significant change since last tracing Confirmed by Charlyn Sora [phone]) on 03/03/2024 11:27:53 AM  Radiology: [facility]  Chest Portable 1 View Result Date: 03/03/2024 CLINICAL DATA:  Shortness of breath EXAM: PORTABLE CHEST 1 VIEW COMPARISON:  November 25, 2023 FINDINGS: There is cardiomegaly with increased basilar infiltrate most likely edema. No pleural effusion or pneumothorax. IMPRESSION: Cardiomegaly with increased basilar infiltrates most likely pulmonary edema Electronically Signed   By: Nancyann Burns M.D.   On: 03/03/2024 12:59     .Critical Care  Performed by: Charlyn Sora, MD Authorized by: Charlyn Sora, MD   Critical care provider statement:    Critical care time (minutes):  36   Critical care was necessary to treat or prevent imminent or life-threatening deterioration of the following conditions:  Respiratory failure   Critical care was time spent personally by me on the following activities:  Development of treatment plan with patient or surrogate, discussions with consultants, evaluation of patient's response to treatment, examination of patient, ordering and review of laboratory studies, ordering and review of radiographic studies, ordering and performing treatments and interventions, pulse oximetry, re-evaluation of patient's condition and review of old charts Comments:     Acute on chronic hypoxic respiratory failure, pulmonary edema    Medications Ordered in the ED  furosemide  (LASIX ) injection 40 mg (has no administration in time range)  albuterol  (PROVENTIL ) (2.5 MG/3ML) 0.083% nebulizer solution (10 mg/hr Nebulization Given 03/03/24 1212)  ipratropium  (ATROVENT ) nebulizer solution 0.5 mg (0.5 mg Nebulization Given 03/03/24 1213)  methylPREDNISolone  sodium succinate (SOLU-MEDROL ) 125 mg/2 mL injection 125 mg (125 mg Intravenous Given 03/03/24 1213)                                    Medical Decision Making Amount and/or Complexity of Data Reviewed Labs: ordered. Radiology: ordered.  Risk Prescription drug management. Decision regarding hospitalization.   This patient presents to the ED with chief complaint(s) of shortness of breath with pertinent past medical history of hypertension, chronic COPD with hypoxia, 4 L of oxygen  use and last echocardiogram showing preserved EF, but concerns for pulmonary hypertension.The complaint involves an extensive differential diagnosis and also carries with it a high risk of complications and morbidity.    The differential diagnosis includes : Acute pulmonary edema, pleural effusion, COPD exacerbation, worsening of chronic lung disease, pulmonary hypertension, pulmonary embolism.  I reviewed patient's record.  Her echo recently was reassuring.  She had a CT PE last year which was negative.  I have low suspicion for acute PE at this time.  Patient is O2 sats maintained at 99% on rest and she does not have any pleuritic chest pain.  I have higher suspicion for worsening of chronic disease, pulmonary hypertension related issues or mild pulmonary edema.  The initial plan is to get basic labs, we will give patient nebulizer treatment and reassess.  Anticipate admission.   Additional history obtained: Additional history obtained from family Records reviewed previous admission documents, Primary Care Documents, and cardiology notes  Independent labs interpretation:  The following labs were independently interpreted: CBC, Rebollar profile is normal.  BNP is about 150.  Independent visualization and interpretation of imaging: - I independently visualized the following imaging with scope of interpretation  limited to determining acute life threatening conditions related to emergency care: X-ray of the chest, which revealed evidence of pulmonary edema.  Treatment and Reassessment: Patient reassessed.  She was given albuterol  10/h.  She feels slightly better. Lung exam is primarily unchanged.  We will admit her for pulmonary edema  with acute on chronic hypoxic respiratory failure We will give her 40 mg of IV Lasix .   Final diagnoses:  Acute on chronic hypoxic respiratory failure (HCC)  Acute pulmonary edema [facility])    ED Discharge Orders     None          Charlyn Sora, MD 03/03/24 1328    Charlyn Sora, MD 03/03/24 1337

## 2024-03-04 ENCOUNTER — Observation Stay (HOSPITAL_COMMUNITY)

## 2024-03-04 DIAGNOSIS — R0609 Other forms of dyspnea: Secondary | ICD-10-CM | POA: Diagnosis not present

## 2024-03-04 DIAGNOSIS — J9621 Acute and chronic respiratory failure with hypoxia: Secondary | ICD-10-CM | POA: Diagnosis not present

## 2024-03-04 LAB — COMPREHENSIVE METABOLIC PANEL WITH GFR
ALT: 7 U/L (ref 0–44)
AST: 19 U/L (ref 15–41)
Albumin: 3.9 g/dL (ref 3.5–5.0)
Alkaline Phosphatase: 63 U/L (ref 38–126)
Anion gap: 8 (ref 5–15)
BUN: 17 mg/dL (ref 8–23)
CO2: 31 mmol/L (ref 22–32)
Calcium: 10.8 mg/dL — ABNORMAL HIGH (ref 8.9–10.3)
Chloride: 99 mmol/L (ref 98–111)
Creatinine, Ser: 0.86 mg/dL (ref 0.44–1.00)
GFR, Estimated: >60 mL/min (ref >60)
Glucose, Bld: 135 mg/dL — ABNORMAL HIGH (ref 70–99)
Potassium: 4 mmol/L (ref 3.5–5.1)
Sodium: 139 mmol/L (ref 135–145)
Total Bilirubin: 0.3 mg/dL (ref 0.0–1.2)
Total Protein: 7.7 g/dL (ref 6.5–8.1)

## 2024-03-04 LAB — ECHOCARDIOGRAM COMPLETE
Area-P 1/2: 3.03 cm2
Height: 64 in
S' Lateral: 3.8 cm
Weight: 3118.19 [oz_av]

## 2024-03-04 LAB — CBC
HCT: 31.6 % — ABNORMAL LOW (ref 36.0–46.0)
Hemoglobin: 9.6 g/dL — ABNORMAL LOW (ref 12.0–15.0)
MCH: 26.1 pg (ref 26.0–34.0)
MCHC: 30.4 g/dL (ref 30.0–36.0)
MCV: 85.9 fL (ref 80.0–100.0)
Platelets: 296 K/uL (ref 150–400)
RBC: 3.68 MIL/uL — ABNORMAL LOW (ref 3.87–5.11)
RDW: 14.9 % (ref 11.5–15.5)
WBC: 6.9 K/uL (ref 4.0–10.5)
nRBC: 0 % (ref 0.0–0.2)

## 2024-03-04 MED ORDER — ENOXAPARIN SODIUM 40 MG/0.4ML IJ SOSY
40.0000 mg | PREFILLED_SYRINGE | INTRAMUSCULAR | Status: DC
  Administered 2024-03-04 – 2024-03-06 (×3): 40 mg via SUBCUTANEOUS
  Filled 2024-03-04 (×3): qty 0.4

## 2024-03-04 MED ORDER — TRAMADOL HCL 50 MG PO TABS
100.0000 mg | ORAL_TABLET | Freq: Four times a day (QID) | ORAL | Status: DC | PRN
  Administered 2024-03-04 – 2024-03-07 (×3): 100 mg via ORAL
  Filled 2024-03-04 (×3): qty 2

## 2024-03-04 NOTE — Progress Notes (Signed)
 Heart Failure Navigator Progress Note  Assessed for Heart & Vascular TOC clinic readiness.  Patient does not meet criteria due to EF 70-75%, has a scheduled Pulmonologist appointment on 04/03/2024, and a scheduled [facility] appointment on 04/22/2024. NO HF TOC. .   Navigator will sign off at this time.   Stephane Haddock, BSN, Scientist, Clinical (histocompatibility And Immunogenetics) Only

## 2024-03-04 NOTE — Progress Notes (Signed)
 PROGRESS NOTE    Wendy Torres  FMW:[id number] DOB: [birth year] DOA: 03/03/2024 PCP: [name], MD  Brief Narrative: hpi per dr ortiz-11/3 83 y.o. female with medical history significant of allergic rhinitis, anemia, anxiety, osteoarthritis, asthma, cataracts, grade 2 diastolic dysfunction, chronic pain syndrome, lower back pain, COPD, urinary frequency, overactive bladder, GERD, hiatal hernia, history of H. pylori gastritis, hyperlipidemia, hypersomnia, hypertension, class I obesity (improved from class III), osteoporosis, recurrent palpitations, history of TIA, syncopal episode, COPD with chronic respiratory failure with hypoxia on home oxygen  at 4 LPM who presented to the emergency department complaints of progressively worsening dyspnea, associated with wheezing for the past 2 days despite increasing her Kaplan O2 to 6 LPM  chest radiograph showing cardiomegaly with increase basilar infiltrates most likely pulmonary edema.  Assessment & Plan:   Principal Problem:   Acute on chronic respiratory failure with hypoxia (HCC) Active Problems:   Dyslipidemia, goal LDL below 130   Iron  deficiency anemia   Essential hypertension   GERD   Insomnia   Anxiety   COPD with acute exacerbation (HCC)   Hypercalcemia   Acute on chronic diastolic congestive heart failure (HCC)   Acute on chronic respiratory failure with hypoxia in the setting of COPD and diastolic heart failure.  Patient is admitted with worsening shortness of breath.  She did have some lower extremity edema and chest x-ray consistent with possible fluid overload.  BNP 148 (likely low from obesity) Echo this admission shows normal ejection fraction.  Continue treatment with prednisone  40 mg daily for 5 days along with bronchodilators Continue Lasix  20 mg twice daily Continue ACE inhibitor.   At baseline she is on 4 L of oxygen  at home. She is still with shortness of breath and dyspnea on exertion more than her  baseline. Echocardiogram this admission  Left ventricular ejection fraction, by estimation, is 70 to 75%. The  left ventricle has hyperdynamic function. The left ventricle has no  regional wall motion abnormalities. There is moderate concentric left  ventricular hypertrophy. Left ventricular  diastolic parameters are consistent with Grade I diastolic dysfunction  (impaired relaxation).  OOB consult PT    Dyslipidemia, goal LDL below 130 Continue lovastatin  40 mg p.o. daily      Iron  deficiency anemia Continue iron  supplementation. Monitor hematocrit and hemoglobin.     Essential hypertension Continue ARB and furosemide  as above. Continue amlodipine  10 mg p.o. daily. Continue hydralazine  100 mg p.o. 3 times daily. Continue clonidine  0.2 mg p.o. twice daily.     GERD Pantoprazole  40 mg p.o. daily.   Chronic pain she is requesting to restart Ultram  she takes at home.    Insomnia Continue trazodone  50 mg p.o. bedtime.    Anxiety Continue alprazolam  0.25 mg p.o. twice daily as needed.   Chronic hypercalcemia followed by PCP.  Calcium 10.8.    Estimated body mass index is 33.45 kg/m as calculated from the following:   Height as of this encounter: 5' 4 (1.626 m).   Weight as of this encounter: 88.4 kg.  DVT prophylaxis: lovenox  Code Status: full Family Communication: dw patient Disposition Plan:  Status is: Observation   Consultants:  none  Procedures: none Antimicrobials none  Subjective: She is resting in bed on 4 L o2  C/o sob and doe  Didn't sleep well Walks with a walker at home  Objective: Vitals:   03/03/24 2137 03/04/24 0531 03/04/24 1121 03/04/24 1519  BP: (!) 158/71 (!) 174/77 (!) 166/74 (!) 142/61  Pulse: 85  69 80 69  Resp: 16 16 20 20   Temp: 98.1 F (36.7 C) 98.2 F (36.8 C) 97.9 F (36.6 C) 98.2 F (36.8 C)  TempSrc: Oral Oral Oral Oral  SpO2: 93% 97% 95% 96%  Weight:      Height:        Intake/Output Summary (Last 24 hours) at  03/04/2024 1710 Last data filed at 03/04/2024 1220 Gross per 24 hour  Intake 480 ml  Output 2800 ml  Net -2320 ml   Filed Weights   03/03/24 1450  Weight: 88.4 kg    Examination:  General exam: Appears calm and comfortable  Respiratory system: wheezing and rales b/l Respiratory effort normal. Cardiovascular system:reg Gastrointestinal system: Abdomen is nondistended, soft and nontender. No organomegaly or masses felt. Normal bowel sounds heard. Central nervous system: Alert and oriented. No focal neurological deficits. Extremities: 1 plus edema b/l  Data Reviewed: I have personally reviewed following labs and imaging studies  CBC: Recent Labs  Lab 03/03/24 1204 03/04/24 0438  WBC 7.8 6.9  HGB 9.7* 9.6*  HCT 32.3* 31.6*  MCV 86.6 85.9  PLT 309 296   Basic Metabolic Panel: Recent Labs  Lab 03/03/24 1204 03/04/24 0438  NA 138 139  K 3.9 4.0  CL 99 99  CO2 30 31  GLUCOSE 140* 135*  BUN 14 17  CREATININE 0.86 0.86  CALCIUM 10.6* 10.8*   GFR: Estimated Creatinine Clearance: 51.5 mL/min (by C-G formula based on SCr of 0.86 mg/dL). Liver Function Tests: Recent Labs  Lab 03/04/24 0438  AST 19  ALT 7  ALKPHOS 63  BILITOT 0.3  PROT 7.7  ALBUMIN 3.9   No results for input(s): LIPASE, AMYLASE in the last 168 hours. No results for input(s): AMMONIA in the last 168 hours. Coagulation Profile: No results for input(s): INR, PROTIME in the last 168 hours. Cardiac Enzymes: No results for input(s): CKTOTAL, CKMB, CKMBINDEX, TROPONINI in the last 168 hours. BNP (last 3 results) Recent Labs    03/03/24 1204  PROBNP 148.0   HbA1C: No results for input(s): HGBA1C in the last 72 hours. CBG: No results for input(s): GLUCAP in the last 168 hours. Lipid Profile: No results for input(s): CHOL, HDL, LDLCALC, TRIG, CHOLHDL, LDLDIRECT in the last 72 hours. Thyroid  Function Tests: No results for input(s): TSH, T4TOTAL, FREET4,  T3FREE, THYROIDAB in the last 72 hours. Anemia Panel: No results for input(s): VITAMINB12, FOLATE, FERRITIN, TIBC, IRON , RETICCTPCT in the last 72 hours. Sepsis Labs: No results for input(s): PROCALCITON, LATICACIDVEN in the last 168 hours.  No results found for this or any previous visit (from the past 240 hours).       Radiology Studies: ECHOCARDIOGRAM COMPLETE Result Date: 03/04/2024    ECHOCARDIOGRAM REPORT   Patient Name:   Wendy Torres Date of Exam: 03/04/2024 Medical Rec #:  [id number]      Height:       64.0 in Accession #:    [id number]     Weight:       194.9 lb Date of Birth:  18-Apr-1941      BSA:          1.935 m Patient Age:    83 years       BP:           174/77 mmHg Patient Gender: F              HR:           81 bpm. Exam Location:  Inpatient Procedure: 2D Echo, Cardiac Doppler and Color Doppler (Both Spectral and Color            Flow Doppler were utilized during procedure). Indications:    Dyspnea R06.00  History:        Patient has prior history of Echocardiogram examinations, most                 recent 04/06/2023.  Sonographer:    Tinnie Gosling RDCS Referring Phys: [phone] DAVID MANUEL ORTIZ IMPRESSIONS  1. Left ventricular ejection fraction, by estimation, is 70 to 75%. The left ventricle has hyperdynamic function. The left ventricle has no regional wall motion abnormalities. There is moderate concentric left ventricular hypertrophy. Left ventricular diastolic parameters are consistent with Grade I diastolic dysfunction (impaired relaxation).  2. Right ventricular systolic function is normal. The right ventricular size is moderately enlarged. There is mildly elevated pulmonary artery systolic pressure.  3. Left atrial size was mildly dilated.  4. The mitral valve is normal in structure. No evidence of mitral valve regurgitation. No evidence of mitral stenosis.  5. The aortic valve was not well visualized. Aortic valve regurgitation is not visualized.  6. The  inferior vena cava is dilated in size with <50% respiratory variability, suggesting right atrial pressure of 20 mmHg. Comparison(s): Prior images reviewed side by side. Right ventricle has further dilated; TR not as well assessed in this study. FINDINGS  Left Ventricle: Left ventricular ejection fraction, by estimation, is 70 to 75%. The left ventricle has hyperdynamic function. The left ventricle has no regional wall motion abnormalities. The left ventricular internal cavity size was normal in size. There is moderate concentric left ventricular hypertrophy. Left ventricular diastolic parameters are consistent with Grade I diastolic dysfunction (impaired relaxation). Right Ventricle: The right ventricular size is moderately enlarged. No increase in right ventricular wall thickness. Right ventricular systolic function is normal. There is mildly elevated pulmonary artery systolic pressure. The tricuspid regurgitant velocity is 2.48 m/s, and with an assumed right atrial pressure of 20 mmHg, the estimated right ventricular systolic pressure is 44.6 mmHg. Left Atrium: Left atrial size was mildly dilated. Right Atrium: Right atrial size was normal in size. Pericardium: There is no evidence of pericardial effusion. Mitral Valve: The mitral valve is normal in structure. No evidence of mitral valve regurgitation. No evidence of mitral valve stenosis. Tricuspid Valve: The tricuspid valve is normal in structure. Tricuspid valve regurgitation is trivial. No evidence of tricuspid stenosis. Aortic Valve: The aortic valve was not well visualized. Aortic valve regurgitation is not visualized. Pulmonic Valve: The pulmonic valve was not well visualized. Pulmonic valve regurgitation is not visualized. No evidence of pulmonic stenosis. Aorta: The aortic root and ascending aorta are structurally normal, with no evidence of dilitation. Venous: The inferior vena cava is dilated in size with less than 50% respiratory variability,  suggesting right atrial pressure of 15 mmHg. IAS/Shunts: The atrial septum is grossly normal.  LEFT VENTRICLE PLAX 2D LVIDd:         4.70 cm   Diastology LVIDs:         3.80 cm   LV e' medial:    4.57 cm/s LV PW:         1.30 cm   LV E/e' medial:  22.8 LV IVS:        1.30 cm   LV e' lateral:   8.59 cm/s LVOT diam:     2.10 cm   LV E/e' lateral: 12.1 LV SV:  95 LV SV Index:   49 LVOT Area:     3.46 cm LV IVRT:       95 msec  RIGHT VENTRICLE             IVC RV S prime:     17.00 cm/s  IVC diam: 2.60 cm TAPSE (M-mode): 2.5 cm                             PULMONARY VEINS                             Diastolic Velocity: 37.20 cm/s                             S/D Velocity:       1.20                             Systolic Velocity:  43.20 cm/s LEFT ATRIUM             Index        RIGHT ATRIUM           Index LA Vol (A2C):   60.7 ml 31.37 ml/m  RA Area:     20.30 cm LA Vol (A4C):   71.9 ml 37.16 ml/m  RA Volume:   53.60 ml  27.70 ml/m LA Biplane Vol: 67.5 ml 34.88 ml/m  AORTIC VALVE LVOT Vmax:   130.00 cm/s LVOT Vmean:  85.800 cm/s LVOT VTI:    0.275 m  AORTA Ao Root diam: 2.80 cm Ao Asc diam:  3.30 cm MITRAL VALVE                TRICUSPID VALVE MV Area (PHT): 3.03 cm     TR Peak grad:   24.6 mmHg MV Decel Time: 250 msec     TR Vmax:        248.00 cm/s MV E velocity: 104.00 cm/s MV A velocity: 122.00 cm/s  SHUNTS MV E/A ratio:  0.85         Systemic VTI:  0.28 m                             Systemic Diam: 2.10 cm Stanly Leavens MD Electronically signed by Stanly Leavens MD Signature Date/Time: 03/04/2024/10:52:29 AM    Final    DG Chest Portable 1 View Result Date: 03/03/2024 CLINICAL DATA:  Shortness of breath EXAM: PORTABLE CHEST 1 VIEW COMPARISON:  November 25, 2023 FINDINGS: There is cardiomegaly with increased basilar infiltrate most likely edema. No pleural effusion or pneumothorax. IMPRESSION: Cardiomegaly with increased basilar infiltrates most likely pulmonary edema Electronically Signed   By:  Nancyann Burns M.D.   On: 03/03/2024 12:59    Scheduled Meds:  amLODipine   10 mg Oral Daily   And   irbesartan   300 mg Oral Daily   aspirin  EC  81 mg Oral Daily   cloNIDine   0.2 mg Oral BID   fesoterodine  4 mg Oral Daily   furosemide   20 mg Intravenous BID   gabapentin   600 mg Oral TID   guaiFENesin   600 mg Oral BID   hydrALAZINE   100 mg Oral TID   Influenza vac split trivalent PF  0.5 mL Intramuscular Tomorrow-1000  iron  polysaccharides  150 mg Oral Q breakfast   pantoprazole   40 mg Oral QAC breakfast   potassium chloride  SA  20 mEq Oral Daily   pravastatin   40 mg Oral q1800   predniSONE   40 mg Oral Q breakfast   traZODone   50 mg Oral QHS   Continuous Infusions:   LOS: 0 days    Almarie KANDICE Hoots, MD  03/04/2024, 5:10 PM

## 2024-03-04 NOTE — Progress Notes (Signed)
  Echocardiogram 2D Echocardiogram has been performed.  Tinnie FORBES Gosling RDCS 03/04/2024, 9:17 AM

## 2024-03-05 DIAGNOSIS — G4700 Insomnia, unspecified: Secondary | ICD-10-CM

## 2024-03-05 DIAGNOSIS — I10 Essential (primary) hypertension: Secondary | ICD-10-CM

## 2024-03-05 DIAGNOSIS — E785 Hyperlipidemia, unspecified: Secondary | ICD-10-CM

## 2024-03-05 DIAGNOSIS — I5033 Acute on chronic diastolic (congestive) heart failure: Secondary | ICD-10-CM

## 2024-03-05 DIAGNOSIS — J441 Chronic obstructive pulmonary disease with (acute) exacerbation: Secondary | ICD-10-CM | POA: Diagnosis not present

## 2024-03-05 DIAGNOSIS — D509 Iron deficiency anemia, unspecified: Secondary | ICD-10-CM

## 2024-03-05 DIAGNOSIS — K2100 Gastro-esophageal reflux disease with esophagitis, without bleeding: Secondary | ICD-10-CM

## 2024-03-05 DIAGNOSIS — J9621 Acute and chronic respiratory failure with hypoxia: Secondary | ICD-10-CM | POA: Diagnosis not present

## 2024-03-05 DIAGNOSIS — F419 Anxiety disorder, unspecified: Secondary | ICD-10-CM

## 2024-03-05 LAB — CBC
HCT: 36.8 % (ref 36.0–46.0)
Hemoglobin: 11.1 g/dL — ABNORMAL LOW (ref 12.0–15.0)
MCH: 25.9 pg — ABNORMAL LOW (ref 26.0–34.0)
MCHC: 30.2 g/dL (ref 30.0–36.0)
MCV: 85.8 fL (ref 80.0–100.0)
Platelets: 384 K/uL (ref 150–400)
RBC: 4.29 MIL/uL (ref 3.87–5.11)
RDW: 15.2 % (ref 11.5–15.5)
WBC: 9.4 K/uL (ref 4.0–10.5)
nRBC: 0 % (ref 0.0–0.2)

## 2024-03-05 LAB — BASIC METABOLIC PANEL WITH GFR
Anion gap: 13 (ref 5–15)
BUN: 30 mg/dL — ABNORMAL HIGH (ref 8–23)
CO2: 27 mmol/L (ref 22–32)
Calcium: 10.5 mg/dL — ABNORMAL HIGH (ref 8.9–10.3)
Chloride: 97 mmol/L — ABNORMAL LOW (ref 98–111)
Creatinine, Ser: 1.03 mg/dL — ABNORMAL HIGH (ref 0.44–1.00)
GFR, Estimated: 53 mL/min — ABNORMAL LOW (ref >60)
Glucose, Bld: 113 mg/dL — ABNORMAL HIGH (ref 70–99)
Potassium: 3.6 mmol/L (ref 3.5–5.1)
Sodium: 137 mmol/L (ref 135–145)

## 2024-03-05 LAB — MAGNESIUM: Magnesium: 2.5 mg/dL — ABNORMAL HIGH (ref 1.7–2.4)

## 2024-03-05 MED ORDER — MECLIZINE HCL 25 MG PO TABS
12.5000 mg | ORAL_TABLET | Freq: Three times a day (TID) | ORAL | Status: DC | PRN
Start: 2024-03-05 — End: 2024-03-07

## 2024-03-05 MED ORDER — FLUTICASONE PROPIONATE 50 MCG/ACT NA SUSP
2.0000 | Freq: Every day | NASAL | Status: DC
  Administered 2024-03-05 – 2024-03-06 (×2): 2 via NASAL
  Filled 2024-03-05: qty 16

## 2024-03-05 MED ORDER — IPRATROPIUM-ALBUTEROL 0.5-2.5 (3) MG/3ML IN SOLN
3.0000 mL | Freq: Three times a day (TID) | RESPIRATORY_TRACT | Status: DC
  Administered 2024-03-05 – 2024-03-07 (×6): 3 mL via RESPIRATORY_TRACT
  Filled 2024-03-05 (×6): qty 3

## 2024-03-05 MED ORDER — LORATADINE 10 MG PO TABS
10.0000 mg | ORAL_TABLET | Freq: Every day | ORAL | Status: DC
  Administered 2024-03-05 – 2024-03-07 (×3): 10 mg via ORAL
  Filled 2024-03-05 (×3): qty 1

## 2024-03-05 MED ORDER — TIZANIDINE HCL 4 MG PO TABS
4.0000 mg | ORAL_TABLET | Freq: Four times a day (QID) | ORAL | Status: DC | PRN
Start: 2024-03-05 — End: 2024-03-07

## 2024-03-05 MED ORDER — FUROSEMIDE 10 MG/ML IJ SOLN
20.0000 mg | Freq: Every day | INTRAMUSCULAR | Status: DC
  Administered 2024-03-06: 20 mg via INTRAVENOUS
  Filled 2024-03-05: qty 2

## 2024-03-05 MED ORDER — BUDESONIDE 0.5 MG/2ML IN SUSP
0.5000 mg | Freq: Two times a day (BID) | RESPIRATORY_TRACT | Status: DC
  Administered 2024-03-05 – 2024-03-07 (×5): 0.5 mg via RESPIRATORY_TRACT
  Filled 2024-03-05 (×5): qty 2

## 2024-03-05 MED ORDER — TRIAMCINOLONE ACETONIDE 0.1 % EX CREA: 1.0000 | TOPICAL_CREAM | Freq: Two times a day (BID) | CUTANEOUS | Status: DC | PRN

## 2024-03-05 MED ORDER — IPRATROPIUM-ALBUTEROL 0.5-2.5 (3) MG/3ML IN SOLN
3.0000 mL | Freq: Three times a day (TID) | RESPIRATORY_TRACT | Status: DC
  Administered 2024-03-05: 3 mL via RESPIRATORY_TRACT
  Filled 2024-03-05: qty 3

## 2024-03-05 NOTE — Progress Notes (Signed)
 PROGRESS NOTE    Wendy Torres  FMW:[id number] DOB: 21-May-1940 DOA: 03/03/2024 PCP: [name], MD    Chief Complaint  Patient presents with   Shortness of Breath    Brief Narrative:  Brief Narrative: hpi per dr ortiz-11/3 83 y.o. female with medical history significant of allergic rhinitis, anemia, anxiety, osteoarthritis, asthma, cataracts, grade 2 diastolic dysfunction, chronic pain syndrome, lower back pain, COPD, urinary frequency, overactive bladder, GERD, hiatal hernia, history of H. pylori gastritis, hyperlipidemia, hypersomnia, hypertension, class I obesity (improved from class III), osteoporosis, recurrent palpitations, history of TIA, syncopal episode, COPD with chronic respiratory failure with hypoxia on home oxygen  at 4 LPM who presented to the emergency department complaints of progressively worsening dyspnea, associated with wheezing for the past 2 days despite increasing her  O2 to 6 LPM  chest radiograph showing cardiomegaly with increase basilar infiltrates most likely pulmonary edema.    Assessment & Plan:   Principal Problem:   Acute on chronic respiratory failure with hypoxia (HCC) Active Problems:   Dyslipidemia, goal LDL below 130   Iron  deficiency anemia   Essential hypertension   GERD   Insomnia   Anxiety   COPD with acute exacerbation (HCC)   Hypercalcemia   Acute on chronic diastolic congestive heart failure (HCC)  #1 acute on chronic respiratory failure with hypoxia in the setting of COPD and acute diastolic heart failure -Patient noted to be on 4 L O2 at baseline at home. -Patient noted to have presented with worsening shortness of breath. - Patient noted with some lower extremity edema and chest x-ray concerning for possible pulmonary edema. - BNP noted at 148. - 2D echo done EF of 70 to 75%, and WMA, moderate concentric LVH, grade 1 DD, mildly dilated left atrial size. - Patient with urine output of 3.5 L over the past 24 hours. - Patient  is -3.4 L during this hospitalization. - Patient with slight bump with creatinine and BUN and as such we will decrease Lasix  to 20 mg IV daily. - Continue ACE inhibitor. - Patient still with some shortness of breath on exertion more than her baseline. - Patient noted to have sats of 94% on 4 L at rest, 80% on 4 L with ambulation with some dyspnea per PT note. - Continue prednisone , scheduled DuoNebs, Mucinex . - Placed on Pulmicort . - Supportive care.  2.  Hyperlipidemia -Continue statin.  3.  Iron  deficiency anemia -Continue oral iron  supplementation - Hemoglobin currently at 11.1.  4.  Hypertension -Continue amlodipine  10 mg daily. - Continue hydralazine  100 mg 3 times daily. - Continue clonidine  0.2 mg twice daily. - Continue IV Lasix  and decreased to 20 mg daily. - Continue Avapro  300 mg daily.  5.  GERD -PPI.  6.  Anxiety -Continue alprazolam  0.25 mg twice daily as needed.  7.  Chronic hypercalcemia -Being followed in outpatient setting per PCP.  8.  Insomnia -Trazodone  50 mg nightly.  9.  Chronic pain -Continue home regimen Ultram  as needed.   DVT prophylaxis: Lovenox  Code Status: Full Family Communication: Updated daughters at bedside. Disposition: Home when clinically improved.  Status is: Observation The patient remains OBS appropriate and will d/c before 2 midnights.   Consultants:  None  Procedures:  Chest x-ray 03/03/2024 2D echo 03/04/2024   Antimicrobials:  Anti-infectives (From admission, onward)    None         Subjective: Patient sitting up in chair.  Overall feeling much better than she did.  Feels shortness of breath  has improved.  Feels wheezing has improved.  2 daughters at bedside.  Objective: Vitals:   03/05/24 0448 03/05/24 0739 03/05/24 0953 03/05/24 1209  BP: (!) 160/76 (!) 151/65 (!) 144/63   Pulse: 69 68    Resp: 16     Temp: 98.3 F (36.8 C) 98.9 F (37.2 C)    TempSrc: Oral Oral    SpO2: 92% 94%  94%  Weight:       Height:        Intake/Output Summary (Last 24 hours) at 03/05/2024 1331 Last data filed at 03/05/2024 1022 Gross per 24 hour  Intake 720 ml  Output 2300 ml  Net -1580 ml   Filed Weights   03/03/24 1450  Weight: 88.4 kg    Examination:  General exam: Appears calm and comfortable  Respiratory system: Bibasilar crackles.  No significant wheezing.  Fair air movement.  Speaking in full sentences.  No use of accessory muscles of respiration Cardiovascular system: S1 & S2 heard, RRR. No JVD, murmurs, rubs, gallops or clicks. No pedal edema. Gastrointestinal system: Abdomen is nondistended, soft and nontender. No organomegaly or masses felt. Normal bowel sounds heard. Central nervous system: Alert and oriented. No focal neurological deficits. Extremities: Symmetric 5 x 5 power. Skin: No rashes, lesions or ulcers Psychiatry: Judgement and insight appear normal. Mood & affect appropriate.     Data Reviewed: I have personally reviewed following labs and imaging studies  CBC: Recent Labs  Lab 03/03/24 1204 03/04/24 0438 03/05/24 1208  WBC 7.8 6.9 9.4  HGB 9.7* 9.6* 11.1*  HCT 32.3* 31.6* 36.8  MCV 86.6 85.9 85.8  PLT 309 296 384    Basic Metabolic Panel: Recent Labs  Lab 03/03/24 1204 03/04/24 0438 03/05/24 1208  NA 138 139 137  K 3.9 4.0 3.6  CL 99 99 97*  CO2 30 31 27   GLUCOSE 140* 135* 113*  BUN 14 17 30*  CREATININE 0.86 0.86 1.03*  CALCIUM 10.6* 10.8* 10.5*  MG  --   --  2.5*    GFR: Estimated Creatinine Clearance: 43 mL/min (A) (by C-G formula based on SCr of 1.03 mg/dL (H)).  Liver Function Tests: Recent Labs  Lab 03/04/24 0438  AST 19  ALT 7  ALKPHOS 63  BILITOT 0.3  PROT 7.7  ALBUMIN 3.9    CBG: No results for input(s): GLUCAP in the last 168 hours.   No results found for this or any previous visit (from the past 240 hours).       Radiology Studies: ECHOCARDIOGRAM COMPLETE Result Date: 03/04/2024    ECHOCARDIOGRAM REPORT   Patient  Name:   Wendy Torres Date of Exam: 03/04/2024 Medical Rec #:  [id number]      Height:       64.0 in Accession #:    [id number]     Weight:       194.9 lb Date of Birth:  09-14-1940      BSA:          1.935 m Patient Age:    83 years       BP:           174/77 mmHg Patient Gender: F              HR:           81 bpm. Exam Location:  Inpatient Procedure: 2D Echo, Cardiac Doppler and Color Doppler (Both Spectral and Color  Flow Doppler were utilized during procedure). Indications:    Dyspnea R06.00  History:        Patient has prior history of Echocardiogram examinations, most                 recent 04/06/2023.  Sonographer:    Tinnie Gosling RDCS Referring Phys: [phone] DAVID MANUEL ORTIZ IMPRESSIONS  1. Left ventricular ejection fraction, by estimation, is 70 to 75%. The left ventricle has hyperdynamic function. The left ventricle has no regional wall motion abnormalities. There is moderate concentric left ventricular hypertrophy. Left ventricular diastolic parameters are consistent with Grade I diastolic dysfunction (impaired relaxation).  2. Right ventricular systolic function is normal. The right ventricular size is moderately enlarged. There is mildly elevated pulmonary artery systolic pressure.  3. Left atrial size was mildly dilated.  4. The mitral valve is normal in structure. No evidence of mitral valve regurgitation. No evidence of mitral stenosis.  5. The aortic valve was not well visualized. Aortic valve regurgitation is not visualized.  6. The inferior vena cava is dilated in size with <50% respiratory variability, suggesting right atrial pressure of 20 mmHg. Comparison(s): Prior images reviewed side by side. Right ventricle has further dilated; TR not as well assessed in this study. FINDINGS  Left Ventricle: Left ventricular ejection fraction, by estimation, is 70 to 75%. The left ventricle has hyperdynamic function. The left ventricle has no regional wall motion abnormalities. The left  ventricular internal cavity size was normal in size. There is moderate concentric left ventricular hypertrophy. Left ventricular diastolic parameters are consistent with Grade I diastolic dysfunction (impaired relaxation). Right Ventricle: The right ventricular size is moderately enlarged. No increase in right ventricular wall thickness. Right ventricular systolic function is normal. There is mildly elevated pulmonary artery systolic pressure. The tricuspid regurgitant velocity is 2.48 m/s, and with an assumed right atrial pressure of 20 mmHg, the estimated right ventricular systolic pressure is 44.6 mmHg. Left Atrium: Left atrial size was mildly dilated. Right Atrium: Right atrial size was normal in size. Pericardium: There is no evidence of pericardial effusion. Mitral Valve: The mitral valve is normal in structure. No evidence of mitral valve regurgitation. No evidence of mitral valve stenosis. Tricuspid Valve: The tricuspid valve is normal in structure. Tricuspid valve regurgitation is trivial. No evidence of tricuspid stenosis. Aortic Valve: The aortic valve was not well visualized. Aortic valve regurgitation is not visualized. Pulmonic Valve: The pulmonic valve was not well visualized. Pulmonic valve regurgitation is not visualized. No evidence of pulmonic stenosis. Aorta: The aortic root and ascending aorta are structurally normal, with no evidence of dilitation. Venous: The inferior vena cava is dilated in size with less than 50% respiratory variability, suggesting right atrial pressure of 15 mmHg. IAS/Shunts: The atrial septum is grossly normal.  LEFT VENTRICLE PLAX 2D LVIDd:         4.70 cm   Diastology LVIDs:         3.80 cm   LV e' medial:    4.57 cm/s LV PW:         1.30 cm   LV E/e' medial:  22.8 LV IVS:        1.30 cm   LV e' lateral:   8.59 cm/s LVOT diam:     2.10 cm   LV E/e' lateral: 12.1 LV SV:         95 LV SV Index:   49 LVOT Area:     3.46 cm LV IVRT:  95 msec  RIGHT VENTRICLE              IVC RV S prime:     17.00 cm/s  IVC diam: 2.60 cm TAPSE (M-mode): 2.5 cm                             PULMONARY VEINS                             Diastolic Velocity: 37.20 cm/s                             S/D Velocity:       1.20                             Systolic Velocity:  43.20 cm/s LEFT ATRIUM             Index        RIGHT ATRIUM           Index LA Vol (A2C):   60.7 ml 31.37 ml/m  RA Area:     20.30 cm LA Vol (A4C):   71.9 ml 37.16 ml/m  RA Volume:   53.60 ml  27.70 ml/m LA Biplane Vol: 67.5 ml 34.88 ml/m  AORTIC VALVE LVOT Vmax:   130.00 cm/s LVOT Vmean:  85.800 cm/s LVOT VTI:    0.275 m  AORTA Ao Root diam: 2.80 cm Ao Asc diam:  3.30 cm MITRAL VALVE                TRICUSPID VALVE MV Area (PHT): 3.03 cm     TR Peak grad:   24.6 mmHg MV Decel Time: 250 msec     TR Vmax:        248.00 cm/s MV E velocity: 104.00 cm/s MV A velocity: 122.00 cm/s  SHUNTS MV E/A ratio:  0.85         Systemic VTI:  0.28 m                             Systemic Diam: 2.10 cm Stanly Leavens MD Electronically signed by Stanly Leavens MD Signature Date/Time: 03/04/2024/10:52:29 AM    Final         Scheduled Meds:  amLODipine   10 mg Oral Daily   And   irbesartan   300 mg Oral Daily   aspirin  EC  81 mg Oral Daily   budesonide  (PULMICORT ) nebulizer solution  0.5 mg Nebulization BID   cloNIDine   0.2 mg Oral BID   enoxaparin  (LOVENOX ) injection  40 mg Subcutaneous Q24H   fesoterodine  4 mg Oral Daily   fluticasone  2 spray Each Nare Daily   furosemide   20 mg Intravenous BID   gabapentin   600 mg Oral TID   guaiFENesin   600 mg Oral BID   hydrALAZINE   100 mg Oral TID   ipratropium-albuterol   3 mL Nebulization TID   iron  polysaccharides  150 mg Oral Q breakfast   loratadine  10 mg Oral Daily   pantoprazole   40 mg Oral QAC breakfast   potassium chloride  SA  20 mEq Oral Daily   pravastatin   40 mg Oral q1800   predniSONE   40 mg Oral Q breakfast   traZODone   50 mg Oral  QHS   Continuous Infusions:   LOS:  0 days    Time spent: 40 minutes    Toribio Hummer, MD [facility]   To contact the attending provider between 7A-7P or the covering provider during after hours 7P-7A, please log into the web site [email or web address] and access using universal [place] password for that web site. If you do not have the password, please call the hospital operator.  03/05/2024, 1:31 PM

## 2024-03-05 NOTE — TOC Transition Note (Signed)
 Transition of Care [facility]) - Discharge Note   Patient Details  Name: Wendy Torres MRN: [id number] Date of Birth: Jun 26, 1940  [facility] CM/SW Contact:  Tawni CHRISTELLA Eva, LCSW Phone Number: 03/05/2024, 11:40 AM   Clinical Narrative:     CSW met with the pt at bedside to discuss recommendations for home health services. The pt requested that the CSW speak with her daughter, Woodie. CSW spoke with Katrina to review the recommendations. She is agreeable to home health services and requested [facility]. [facility] has accepted the pt for home health services.  CSW also discussed DME. The pt's daughter reported that the patient already has home oxygen  arranged and uses a rolling walker. She inquired whether the patient will receive a nebulizer machine. CSW informed the pt's daughter that the request would be communicated to the MD. The pt's daughter will provide transportation upon discharge. Care management to continue following.   Final next level of care: Home w Home Health Services Barriers to Discharge: Continued Medical Work up   Patient Goals and CMS Choice Patient states their goals for this hospitalization and ongoing recovery are:: retrun home with home health services          Discharge Placement                       Discharge Plan and Services Additional resources added to the After Visit Summary for                            HH Arranged: PT, OT HH Agency: [facility] Date [facility] Agency Contacted: 03/05/24 Time HH Agency Contacted: [phone]    Social Drivers of Health (SDOH) Interventions SDOH Screenings   Food Insecurity: No Food Insecurity (03/03/2024)  Housing: Low Risk  (03/03/2024)  Transportation Needs: No Transportation Needs (03/03/2024)  Utilities: Not At Risk (03/03/2024)  Alcohol  Screen: Low Risk  (12/13/2023)  Depression (PHQ2-9): Low Risk  (12/13/2023)  Financial Resource Strain: Medium Risk (12/13/2023)  Physical  Activity: Inactive (12/13/2023)  Social Connections: Moderately Isolated (03/03/2024)  Stress: No Stress Concern Present (12/13/2023)  Recent Concern: Stress - Stress Concern Present (09/28/2023)  Tobacco Use: Medium Risk (03/03/2024)  Health Literacy: Inadequate Health Literacy (12/13/2023)     Readmission Risk Interventions     No data to display

## 2024-03-05 NOTE — Evaluation (Signed)
 Physical Therapy Evaluation Patient Details Name: Wendy Torres MRN: [id number] DOB: [birth year] Today's Date: 03/05/2024  History of Present Illness  83 yo female admitted with acute resp failure, HF. Hx of OA, COPD, anxiety, anemia, chronic pain, HF, obesity, TIA, syncope, DOE, L THA  Clinical Impression  On eval, pt was CGA for mobility. She ambulated ~75 feet with a rollator. O2: 94% on 4L at rest, 80% on 4L with ambulation. Dyspnea 2.5/4,HR 122 bpm. Cues for pursed lip/deep breathing to aid recovery. Discussed d/c plan-pt plans to return home. She has assistance from family as needed. Will recommend HHPT f/u.         If plan is discharge home, recommend the following: A little help with walking and/or transfers;A little help with bathing/dressing/bathroom;Assistance with cooking/housework;Assist for transportation;Help with stairs or ramp for entrance   Can travel by private vehicle        Equipment Recommendations None recommended by PT  Recommendations for Other Services  OT consult    Functional Status Assessment Patient has had a recent decline in their functional status and demonstrates the ability to make significant improvements in function in a reasonable and predictable amount of time.     Precautions / Restrictions Precautions Precautions: Fall Precaution/Restrictions Comments: monitor O2 (O2 dep) Restrictions Weight Bearing Restrictions Per Provider Order: No      Mobility  Bed Mobility               General bed mobility comments: oob in recliner    Transfers Overall transfer level: Needs assistance Equipment used: Rollator (4 wheels) Transfers: Sit to/from Stand Sit to Stand: Contact guard assist           General transfer comment: Cues for safety, rollator operation    Ambulation/Gait Ambulation/Gait assistance: Contact guard assist Gait Distance (Feet): 75 Feet Assistive device: Rollator (4 wheels)         General Gait Details: O2  80% on 4L. Dyspnea 2.5/4. Fatigued with distance.  Stairs            Wheelchair Mobility     Tilt Bed    Modified Rankin (Stroke Patients Only)       Balance Overall balance assessment: Mild deficits observed, not formally tested                                           Pertinent Vitals/Pain Pain Assessment Pain Assessment: Faces Faces Pain Scale: Hurts little more Pain Location: bil knees (chronic OA) Pain Descriptors / Indicators: Aching Pain Intervention(s): Monitored during session, Repositioned    Home Living Family/patient expects to be discharged to:: Private residence Living Arrangements: Children Available Help at Discharge: Family;Available PRN/intermittently Type of Home: House Home Access: Ramped entrance       Home Layout: One level Home Equipment: Rollator (4 wheels);Wheelchair - manual      Prior Function Prior Level of Function : Needs assist             Mobility Comments: uses rollator for short distances ADLs Comments: family assists (pt reports daughters rotate)     Extremity/Trunk Assessment   Upper Extremity Assessment Upper Extremity Assessment: Defer to OT evaluation    Lower Extremity Assessment Lower Extremity Assessment: Generalized weakness    Cervical / Trunk Assessment Cervical / Trunk Assessment: Normal  Communication   Communication Communication: No apparent difficulties    Cognition Arousal:  Alert Behavior During Therapy: WFL for tasks assessed/performed   PT - Cognitive impairments: No apparent impairments                         Following commands: Intact       Cueing Cueing Techniques: Verbal cues     General Comments      Exercises     Assessment/Plan    PT Assessment Patient needs continued PT services  PT Problem List Decreased strength;Decreased activity tolerance;Decreased balance;Decreased mobility;Decreased knowledge of use of DME       PT Treatment  Interventions DME instruction;Gait training;Functional mobility training;Therapeutic activities;Therapeutic exercise;Patient/family education;Balance training    PT Goals (Current goals can be found in the Care Plan section)  Acute Rehab PT Goals Patient Stated Goal: home PT Goal Formulation: With patient Time For Goal Achievement: 03/19/24 Potential to Achieve Goals: Good    Frequency Min 3X/week     Co-evaluation               AM-PAC PT 6 Clicks Mobility  Outcome Measure Help needed turning from your back to your side while in a flat bed without using bedrails?: A Little Help needed moving from lying on your back to sitting on the side of a flat bed without using bedrails?: A Little Help needed moving to and from a bed to a chair (including a wheelchair)?: A Little Help needed standing up from a chair using your arms (e.g., wheelchair or bedside chair)?: A Little Help needed to walk in hospital room?: A Little Help needed climbing 3-5 steps with a railing? : A Little 6 Click Score: 18    End of Session Equipment Utilized During Treatment: Oxygen  Activity Tolerance: Patient limited by fatigue;Patient tolerated treatment well Patient left: in chair;with call bell/phone within reach   PT Visit Diagnosis: Muscle weakness (generalized) (M62.81);Difficulty in walking, not elsewhere classified (R26.2);Unsteadiness on feet (R26.81)    Time: 9076-9065 PT Time Calculation (min) (ACUTE ONLY): 11 min   Charges:   PT Evaluation $PT Eval Low Complexity: 1 Low   PT General Charges $$ ACUTE PT VISIT: 1 Visit           Dannial SQUIBB, PT Acute Rehabilitation  Office: [phone]

## 2024-03-06 ENCOUNTER — Observation Stay (HOSPITAL_COMMUNITY)

## 2024-03-06 DIAGNOSIS — I5033 Acute on chronic diastolic (congestive) heart failure: Secondary | ICD-10-CM | POA: Diagnosis not present

## 2024-03-06 DIAGNOSIS — J9621 Acute and chronic respiratory failure with hypoxia: Secondary | ICD-10-CM | POA: Diagnosis not present

## 2024-03-06 DIAGNOSIS — E785 Hyperlipidemia, unspecified: Secondary | ICD-10-CM | POA: Diagnosis not present

## 2024-03-06 DIAGNOSIS — J441 Chronic obstructive pulmonary disease with (acute) exacerbation: Secondary | ICD-10-CM | POA: Diagnosis not present

## 2024-03-06 LAB — CBC
HCT: 34.4 % — ABNORMAL LOW (ref 36.0–46.0)
Hemoglobin: 10.4 g/dL — ABNORMAL LOW (ref 12.0–15.0)
MCH: 25.7 pg — ABNORMAL LOW (ref 26.0–34.0)
MCHC: 30.2 g/dL (ref 30.0–36.0)
MCV: 84.9 fL (ref 80.0–100.0)
Platelets: 338 K/uL (ref 150–400)
RBC: 4.05 MIL/uL (ref 3.87–5.11)
RDW: 15 % (ref 11.5–15.5)
WBC: 8.1 K/uL (ref 4.0–10.5)
nRBC: 0 % (ref 0.0–0.2)

## 2024-03-06 LAB — BASIC METABOLIC PANEL WITH GFR
Anion gap: 9 (ref 5–15)
BUN: 33 mg/dL — ABNORMAL HIGH (ref 8–23)
CO2: 30 mmol/L (ref 22–32)
Calcium: 10.2 mg/dL (ref 8.9–10.3)
Chloride: 100 mmol/L (ref 98–111)
Creatinine, Ser: 1.06 mg/dL — ABNORMAL HIGH (ref 0.44–1.00)
GFR, Estimated: 51 mL/min — ABNORMAL LOW (ref >60)
Glucose, Bld: 99 mg/dL (ref 70–99)
Potassium: 3.6 mmol/L (ref 3.5–5.1)
Sodium: 138 mmol/L (ref 135–145)

## 2024-03-06 MED ORDER — FUROSEMIDE 10 MG/ML IJ SOLN
20.0000 mg | Freq: Once | INTRAMUSCULAR | Status: AC
  Administered 2024-03-06: 20 mg via INTRAVENOUS
  Filled 2024-03-06: qty 2

## 2024-03-06 MED ORDER — FUROSEMIDE 40 MG PO TABS
40.0000 mg | ORAL_TABLET | Freq: Every day | ORAL | Status: DC
  Administered 2024-03-07: 40 mg via ORAL
  Filled 2024-03-06: qty 1

## 2024-03-06 NOTE — Progress Notes (Signed)
 PROGRESS NOTE    Wendy Torres  FMW:[id number] DOB: August 08, 1940 DOA: 03/03/2024 PCP: [name], MD    Chief Complaint  Patient presents with   Shortness of Breath    Brief Narrative:  Brief Narrative: hpi per dr ortiz-11/3 83 y.o. female with medical history significant of allergic rhinitis, anemia, anxiety, osteoarthritis, asthma, cataracts, grade 2 diastolic dysfunction, chronic pain syndrome, lower back pain, COPD, urinary frequency, overactive bladder, GERD, hiatal hernia, history of H. pylori gastritis, hyperlipidemia, hypersomnia, hypertension, class I obesity (improved from class III), osteoporosis, recurrent palpitations, history of TIA, syncopal episode, COPD with chronic respiratory failure with hypoxia on home oxygen  at 4 LPM who presented to the emergency department complaints of progressively worsening dyspnea, associated with wheezing for the past 2 days despite increasing her [place] O2 to 6 LPM  chest radiograph showing cardiomegaly with increase basilar infiltrates most likely pulmonary edema.    Assessment & Plan:   Principal Problem:   Acute on chronic respiratory failure with hypoxia (HCC) Active Problems:   Dyslipidemia, goal LDL below 130   Iron  deficiency anemia   Essential hypertension   GERD   Insomnia   Anxiety   COPD with acute exacerbation (HCC)   Hypercalcemia   Acute on chronic diastolic congestive heart failure (HCC)  #1 acute on chronic respiratory failure with hypoxia in the setting of COPD and acute diastolic heart failure -Patient noted to be on 4 L O2 at baseline at home. -Patient noted to have presented with worsening shortness of breath. - Patient noted with some lower extremity edema and chest x-ray concerning for possible pulmonary edema. - BNP noted at 148. - 2D echo done EF of 70 to 75%, and WMA, moderate concentric LVH, grade 1 DD, mildly dilated left atrial size. - Patient with urine output of 1.7 L over the past 24 hours. - Patient  is -4 L during this hospitalization. - Patient with slight bump with creatinine and BUN and as such IV Lasix  was decreased to 20 mg daily.   - Will give Lasix  20 mg IV x 1 this evening and transition to home dose oral Lasix  40 mg daily tomorrow.  - Continue ACE inhibitor. - Patient still with some shortness of breath on exertion more than her baseline and as such we will give her extra dose of Lasix  20 mg IV x 1. - Patient noted to have sats of 92% on 4 L at rest, 81% on 4 L with ambulation with some dyspnea per mobility specialist note. - Continue prednisone , scheduled DuoNebs, Mucinex . -Check a chest x-ray - Continue Pulmicort . - Supportive care.  2.  Hyperlipidemia - Statin.  3.  Iron  deficiency anemia -Continue oral iron  supplementation - Hemoglobin currently at 10.4.  4.  Hypertension -Continue amlodipine  10 mg daily. - Continue hydralazine  100 mg 3 times daily. - Continue clonidine  0.2 mg twice daily. - Continue IV Lasix . - Continue Avapro  300 mg daily.  5.  GERD - Continue PPI.  6.  Anxiety -Continue alprazolam  0.25 mg twice daily as needed.  7.  Chronic hypercalcemia -Being followed in outpatient setting per PCP.  8.  Insomnia - Continue trazodone  50 mg nightly.  9.  Chronic pain -Continue home regimen Ultram  as needed.   DVT prophylaxis: Lovenox  Code Status: Full Family Communication: Updated daughter at bedside. Disposition: Home when clinically improved and improvement with hypoxia.  Status is: Observation The patient remains OBS appropriate and will d/c before 2 midnights.   Consultants:  None  Procedures:  Chest x-ray 03/03/2024 2D echo 03/04/2024   Antimicrobials:  Anti-infectives (From admission, onward)    None         Subjective: Patient sitting up in chair.  Patient denies any chest pain or shortness of breath.  Denies any abdominal pain.  Daughter at bedside.  Patient noted to have desatted to 81% when transferring from chair to  bedside commode.   Objective: Vitals:   03/05/24 2118 03/06/24 0500 03/06/24 0631 03/06/24 0851  BP:   (!) 143/69   Pulse:   62   Resp:   16   Temp:   98.1 F (36.7 C)   TempSrc:   Oral   SpO2: 96%  98% 99%  Weight:  87 kg    Height:        Intake/Output Summary (Last 24 hours) at 03/06/2024 1240 Last data filed at 03/06/2024 0700 Gross per 24 hour  Intake 240 ml  Output 1100 ml  Net -860 ml   Filed Weights   03/03/24 1450 03/06/24 0500  Weight: 88.4 kg 87 kg    Examination:  General exam: NAD. Respiratory system: Decreased bibasilar crackles.  No significant wheezing.  Fair air movement.  Speaking full sentences.  No use of accessory muscles of respiration.  Cardiovascular system: RRR no murmurs rubs or gallops.  No JVD.  No pitting lower extremity edema.   Gastrointestinal system: Abdomen is soft, nontender, nondistended, positive bowel sounds.  No rebound.  No guarding.  Central nervous system: Alert and oriented. No focal neurological deficits. Extremities: Symmetric 5 x 5 power. Skin: No rashes, lesions or ulcers Psychiatry: Judgement and insight appear normal. Mood & affect appropriate.     Data Reviewed: I have personally reviewed following labs and imaging studies  CBC: Recent Labs  Lab 03/03/24 1204 03/04/24 0438 03/05/24 1208 03/06/24 0440  WBC 7.8 6.9 9.4 8.1  HGB 9.7* 9.6* 11.1* 10.4*  HCT 32.3* 31.6* 36.8 34.4*  MCV 86.6 85.9 85.8 84.9  PLT 309 296 384 338    Basic Metabolic Panel: Recent Labs  Lab 03/03/24 1204 03/04/24 0438 03/05/24 1208 03/06/24 0440  NA 138 139 137 138  K 3.9 4.0 3.6 3.6  CL 99 99 97* 100  CO2 30 31 27 30   GLUCOSE 140* 135* 113* 99  BUN 14 17 30* 33*  CREATININE 0.86 0.86 1.03* 1.06*  CALCIUM 10.6* 10.8* 10.5* 10.2  MG  --   --  2.5*  --     GFR: Estimated Creatinine Clearance: 41.4 mL/min (A) (by C-G formula based on SCr of 1.06 mg/dL (H)).  Liver Function Tests: Recent Labs  Lab 03/04/24 0438  AST 19   ALT 7  ALKPHOS 63  BILITOT 0.3  PROT 7.7  ALBUMIN 3.9    CBG: No results for input(s): GLUCAP in the last 168 hours.   No results found for this or any previous visit (from the past 240 hours).       Radiology Studies: No results found.       Scheduled Meds:  amLODipine   10 mg Oral Daily   And   irbesartan   300 mg Oral Daily   aspirin  EC  81 mg Oral Daily   budesonide  (PULMICORT ) nebulizer solution  0.5 mg Nebulization BID   cloNIDine   0.2 mg Oral BID   enoxaparin  (LOVENOX ) injection  40 mg Subcutaneous Q24H   fesoterodine  4 mg Oral Daily   fluticasone  2 spray Each Nare Daily   furosemide   20 mg Intravenous  Daily   gabapentin   600 mg Oral TID   guaiFENesin   600 mg Oral BID   hydrALAZINE   100 mg Oral TID   ipratropium-albuterol   3 mL Nebulization TID   iron  polysaccharides  150 mg Oral Q breakfast   loratadine  10 mg Oral Daily   pantoprazole   40 mg Oral QAC breakfast   potassium chloride  SA  20 mEq Oral Daily   pravastatin   40 mg Oral q1800   predniSONE   40 mg Oral Q breakfast   traZODone   50 mg Oral QHS   Continuous Infusions:   LOS: 0 days    Time spent: 40 minutes    Toribio Hummer, MD [facility]   To contact the attending provider between 7A-7P or the covering provider during after hours 7P-7A, please log into the web site [email or web address] and access using universal [place] password for that web site. If you do not have the password, please call the hospital operator.  03/06/2024, 12:40 PM

## 2024-03-06 NOTE — Progress Notes (Signed)
 Mobility Specialist - Progress Note  ([place] 4L) Pre-mobility: 77 bpm HR, 92% SpO2 During mobility: 112 bpm HR, 81% SpO2 Post-mobility: 97 bpm HR, 90% SPO2   03/06/24 1040  Mobility  Activity Ambulated with assistance  Level of Assistance Contact guard assist, steadying assist  Assistive Device Front wheel walker  Distance Ambulated (ft) 8 ft  Range of Motion/Exercises Active  Activity Response Tolerated fair  Mobility visit 1 Mobility  Mobility Specialist Start Time (ACUTE ONLY) 1025  Mobility Specialist Stop Time (ACUTE ONLY) 1040  Mobility Specialist Time Calculation (min) (ACUTE ONLY) 15 min   Pt was found on recliner chair and agreeable to mobilize. Assisted to [facility] prior to hallway ambulation. Upon standing from [facility] became a little unsteady and stated feeling lightheaded. SPO2 checked to be 81% and returned to recliner chair. Able to increase >90% within 1 min. AT EOS was left on recliner chair with all needs met. Call bell in reach and RN notified.   Erminio Leos,  Mobility Specialist Can be reached via Secure Chat

## 2024-03-07 ENCOUNTER — Other Ambulatory Visit: Payer: Self-pay

## 2024-03-07 DIAGNOSIS — Z9981 Dependence on supplemental oxygen: Secondary | ICD-10-CM | POA: Diagnosis not present

## 2024-03-07 DIAGNOSIS — G4700 Insomnia, unspecified: Secondary | ICD-10-CM | POA: Diagnosis present

## 2024-03-07 DIAGNOSIS — K219 Gastro-esophageal reflux disease without esophagitis: Secondary | ICD-10-CM | POA: Diagnosis present

## 2024-03-07 DIAGNOSIS — Z6833 Body mass index (BMI) 33.0-33.9, adult: Secondary | ICD-10-CM | POA: Diagnosis not present

## 2024-03-07 DIAGNOSIS — Z8249 Family history of ischemic heart disease and other diseases of the circulatory system: Secondary | ICD-10-CM | POA: Diagnosis not present

## 2024-03-07 DIAGNOSIS — I110 Hypertensive heart disease with heart failure: Secondary | ICD-10-CM | POA: Diagnosis present

## 2024-03-07 DIAGNOSIS — Z801 Family history of malignant neoplasm of trachea, bronchus and lung: Secondary | ICD-10-CM | POA: Diagnosis not present

## 2024-03-07 DIAGNOSIS — Z79899 Other long term (current) drug therapy: Secondary | ICD-10-CM | POA: Diagnosis not present

## 2024-03-07 DIAGNOSIS — K2100 Gastro-esophageal reflux disease with esophagitis, without bleeding: Secondary | ICD-10-CM | POA: Diagnosis not present

## 2024-03-07 DIAGNOSIS — Z7982 Long term (current) use of aspirin: Secondary | ICD-10-CM | POA: Diagnosis not present

## 2024-03-07 DIAGNOSIS — Z23 Encounter for immunization: Secondary | ICD-10-CM | POA: Diagnosis present

## 2024-03-07 DIAGNOSIS — E785 Hyperlipidemia, unspecified: Secondary | ICD-10-CM | POA: Diagnosis present

## 2024-03-07 DIAGNOSIS — Z9841 Cataract extraction status, right eye: Secondary | ICD-10-CM | POA: Diagnosis not present

## 2024-03-07 DIAGNOSIS — Z888 Allergy status to other drugs, medicaments and biological substances status: Secondary | ICD-10-CM | POA: Diagnosis not present

## 2024-03-07 DIAGNOSIS — Z9842 Cataract extraction status, left eye: Secondary | ICD-10-CM | POA: Diagnosis not present

## 2024-03-07 DIAGNOSIS — J441 Chronic obstructive pulmonary disease with (acute) exacerbation: Secondary | ICD-10-CM | POA: Diagnosis present

## 2024-03-07 DIAGNOSIS — Z87891 Personal history of nicotine dependence: Secondary | ICD-10-CM | POA: Diagnosis not present

## 2024-03-07 DIAGNOSIS — D509 Iron deficiency anemia, unspecified: Secondary | ICD-10-CM | POA: Diagnosis present

## 2024-03-07 DIAGNOSIS — I5033 Acute on chronic diastolic (congestive) heart failure: Secondary | ICD-10-CM | POA: Diagnosis present

## 2024-03-07 DIAGNOSIS — Z8673 Personal history of transient ischemic attack (TIA), and cerebral infarction without residual deficits: Secondary | ICD-10-CM | POA: Diagnosis not present

## 2024-03-07 DIAGNOSIS — J810 Acute pulmonary edema: Secondary | ICD-10-CM | POA: Diagnosis present

## 2024-03-07 DIAGNOSIS — F419 Anxiety disorder, unspecified: Secondary | ICD-10-CM | POA: Diagnosis present

## 2024-03-07 DIAGNOSIS — Z96643 Presence of artificial hip joint, bilateral: Secondary | ICD-10-CM | POA: Diagnosis present

## 2024-03-07 DIAGNOSIS — J9621 Acute and chronic respiratory failure with hypoxia: Secondary | ICD-10-CM | POA: Diagnosis present

## 2024-03-07 DIAGNOSIS — G894 Chronic pain syndrome: Secondary | ICD-10-CM | POA: Diagnosis present

## 2024-03-07 LAB — CBC
HCT: 34.6 % — ABNORMAL LOW (ref 36.0–46.0)
Hemoglobin: 10.3 g/dL — ABNORMAL LOW (ref 12.0–15.0)
MCH: 25.4 pg — ABNORMAL LOW (ref 26.0–34.0)
MCHC: 29.8 g/dL — ABNORMAL LOW (ref 30.0–36.0)
MCV: 85.4 fL (ref 80.0–100.0)
Platelets: 317 K/uL (ref 150–400)
RBC: 4.05 MIL/uL (ref 3.87–5.11)
RDW: 14.8 % (ref 11.5–15.5)
WBC: 7.9 K/uL (ref 4.0–10.5)
nRBC: 0 % (ref 0.0–0.2)

## 2024-03-07 LAB — BASIC METABOLIC PANEL WITH GFR
Anion gap: 7 (ref 5–15)
BUN: 38 mg/dL — ABNORMAL HIGH (ref 8–23)
CO2: 30 mmol/L (ref 22–32)
Calcium: 9.9 mg/dL (ref 8.9–10.3)
Chloride: 99 mmol/L (ref 98–111)
Creatinine, Ser: 1.16 mg/dL — ABNORMAL HIGH (ref 0.44–1.00)
GFR, Estimated: 46 mL/min — ABNORMAL LOW (ref >60)
Glucose, Bld: 107 mg/dL — ABNORMAL HIGH (ref 70–99)
Potassium: 3.8 mmol/L (ref 3.5–5.1)
Sodium: 136 mmol/L (ref 135–145)

## 2024-03-07 MED ORDER — TRELEGY ELLIPTA 200-62.5-25 MCG/ACT IN AEPB: 1.0000 | INHALATION_SPRAY | Freq: Every day | RESPIRATORY_TRACT | 1 refills | Status: DC

## 2024-03-07 MED ORDER — PREDNISONE 20 MG PO TABS: ORAL_TABLET | ORAL | 0 refills | Status: AC

## 2024-03-07 MED ORDER — LORATADINE 10 MG PO TABS: 10.0000 mg | ORAL_TABLET | Freq: Every day | ORAL | 0 refills | Status: DC

## 2024-03-07 MED ORDER — ALBUTEROL SULFATE HFA 108 (90 BASE) MCG/ACT IN AERS: INHALATION_SPRAY | RESPIRATORY_TRACT | Status: AC

## 2024-03-07 MED ORDER — FLUTICASONE PROPIONATE 50 MCG/ACT NA SUSP: 2.0000 | Freq: Every day | NASAL | 0 refills | Status: AC

## 2024-03-07 NOTE — Discharge Summary (Signed)
 Physician Discharge Summary  Wendy Torres:[id number] DOB: [birth year] DOA: 03/03/2024  PCP: [name], MD  Admit date: 03/03/2024 Discharge date: 03/07/2024  Time spent: 60 minutes  Recommendations for Outpatient Follow-up:  Follow-up with Dr. [name], pulmonary in 2 weeks for follow-up on COPD exacerbation and acute on chronic respiratory failure with hypoxia.  On follow-up patient will need ambulatory sats checked as her O2 was increased to 5 L on ambulation prior to discharge. Follow-up with [name], MD in 2 weeks.  On follow-up patient will need a basic metabolic profile done to follow-up on electrolytes and renal function.  Patient CHF need to be followed up upon.   Discharge Diagnoses:  Principal Problem:   Acute on chronic respiratory failure with hypoxia (HCC) Active Problems:   Dyslipidemia, goal LDL below 130   Iron  deficiency anemia   Essential hypertension   GERD   Insomnia   Anxiety   COPD with acute exacerbation (HCC)   Hypercalcemia   Acute on chronic diastolic congestive heart failure (HCC)   Discharge Condition: Stable and improved.  Diet recommendation: Heart healthy  Filed Weights   03/03/24 1450 03/06/24 0500 03/07/24 0500  Weight: 88.4 kg 87 kg 87.2 kg    History of present illness:  HPI per Dr. [name] Wendy Torres is a 83 y.o. female with medical history significant of allergic rhinitis, anemia, anxiety, osteoarthritis, asthma, cataracts, grade 2 diastolic dysfunction, chronic pain syndrome, lower back pain, COPD, urinary frequency, overactive bladder, GERD, hiatal hernia, history of H. pylori gastritis, hyperlipidemia, hypersomnia, hypertension, class I obesity (improved from class III), osteoporosis, recurrent palpitations, history of TIA, syncopal episode, COPD with chronic respiratory failure with hypoxia on home oxygen  at 4 LPM who presented to the emergency department complaints of progressively worsening dyspnea, associated with wheezing  for the past 2 days despite increasing her [place] O2 to 6 LPM. He denied fever, chills, rhinorrhea, sore throat, wheezing or hemoptysis. No chest pain, palpitations, diaphoresis, PND, orthopnea or pitting edema of the lower extremities. No abdominal pain, nausea, emesis, diarrhea, constipation, melena or hematochezia. No flank pain, dysuria, frequency or hematuria.  No polyuria, polydipsia, polyphagia or blurred vision.    Lab work: CBC showed a white count of 7.8, hemoglobin 9.7 g/dL platelets 690.  BMP showed a glucose of 140 and calcium of 10.6 mg/dL, renal function and the rest of the electrolytes were normal.  proBNP was 148.0 pg/mL.   Imaging: 1 view chest radiograph showing cardiomegaly with increase basilar infiltrates most likely pulmonary edema.   ED course: Initial vital signs were temperature 98 F, pulse 97, respiration 18, BP 110/86 mmHg and O2 sat 99% on room air.  The patient received methylprednisolone  125 mg IVP, and albuterol  10+ ipratropium 0.5 mg continuous neb and furosemide  40 mg IVP.  Hospital Course:  #1 acute on chronic respiratory failure with hypoxia in the setting of COPD and acute diastolic heart failure -Patient noted to be on 4 L O2 at baseline at home. -Patient noted to have presented with worsening shortness of breath. - Patient noted with some lower extremity edema and chest x-ray concerning for possible pulmonary edema. - BNP noted at 148. - 2D echo done EF of 70 to 75%, and WMA, moderate concentric LVH, grade 1 DD, mildly dilated left atrial size. - Patient with good urine output during the hospitalization with diuresis.   -Patient maintained on home regimen ACE inhibitor.   - Patient maintained on IV diuretics and as patient improved clinically  was subsequently transition to home regimen oral Lasix  of 40 mg daily.   - Patient improved clinically and was back to home regimen of 4 L nasal cannula with sats of 97 to 100%.  - On ambulation O2 sats were increased to 5  L with sats consistently greater than 90%.  - Patient also treated for COPD exacerbation with steroids, scheduled DuoNebs, Mucinex , Pulmicort  and Brovana nebs.  - Patient improved clinically and was close to baseline by day of discharge.  - Patient will be discharged home on a steroid taper, resumption of home regimen of Trelegy as well as albuterol  MDI.  - Patient be discharged home in stable and improved condition with outpatient follow-up with primary pulmonologist and PCP.    2.  Hyperlipidemia - Patient maintained on home regimen statin.   3.  Iron  deficiency anemia - Patient maintained on home regimen oral iron  supplementation - Hemoglobin remained stable at 10.3 by day of discharge.   - Outpatient follow-up with PCP.     4.  Hypertension - Patient maintained on home regimen amlodipine  10 mg daily. - Patient maintained on home regimen hydralazine  100 mg 3 times daily. - Patient maintained on home regimen clonidine  0.2 mg twice daily. -Patient maintained on diuresis during the hospitalization and subsequently transition back to home regimen oral Lasix  40 mg daily. -Patient maintained on home regimen Avapro  300 mg daily.   5.  GERD - Patient maintained on home regimen PPI.   6.  Anxiety - Patient maintained on home regimen alprazolam  0.25 mg twice daily as needed.   7.  Chronic hypercalcemia -Being followed in outpatient setting per PCP.   8.  Insomnia - Patient maintained on home regimen trazodone  50 mg nightly.   9.  Chronic pain - Patient maintained on home regimen Ultram  as needed.  Procedures: Chest x-ray 03/03/2024 2D echo 03/04/2024  Consultations: None  Discharge Exam: Vitals:   03/07/24 1347 03/07/24 1435  BP:    Pulse:    Resp:    Temp:    SpO2: 90% 94%    General: NAD Cardiovascular: RRR no murmurs rubs or gallops.  No JVD.  Trace to 1+ bilateral lower extremity edema.  Respiratory: Clear to auscultation bilaterally.  No wheezes, no crackles, no  rhonchi.  Fair air movement.  Speaking in full sentences.  No use of accessory muscles of respiration.  Discharge Instructions   Discharge Instructions     Diet - low sodium heart healthy   Complete by: As directed    Discharge instructions   Complete by: As directed    Continue home oxygen  4 L continuously at rest and increased to 5 L with ambulation.   Increase activity slowly   Complete by: As directed    Increase activity slowly   Complete by: As directed       Allergies as of 03/07/2024       Reactions   Benazepril  Shortness Of Breath, Swelling, Other (See Comments)   Face became swollen, plus eyes and cheeks (too)   Chlorthalidone Shortness Of Breath        Medication List     TAKE these medications    acetaminophen  325 MG tablet Commonly known as: TYLENOL  Take 2 tablets (650 mg total) by mouth every 6 (six) hours as needed for mild pain (or Fever >/= 101).   albuterol  108 (90 Base) MCG/ACT inhaler Commonly known as: VENTOLIN  HFA Inhale 2 puffs into the lungs 3 (three) times daily for 3 days, THEN  2 puffs every 4 (four) hours as needed for wheezing or shortness of breath. Start taking on: March 07, 2024 What changed: See the new instructions.   ALPRAZolam  0.25 MG tablet Commonly known as: XANAX  Take 1 tablet (0.25 mg total) by mouth 2 (two) times daily as needed. What changed: reasons to take this   amLODipine -valsartan  10-320 MG tablet Commonly known as: EXFORGE  Take 1 tablet by mouth daily.   aspirin  EC 81 MG tablet Take 1 tablet (81 mg total) by mouth daily. Swallow whole.   cloNIDine  0.2 MG tablet Commonly known as: CATAPRES  TAKE 1 TABLET BY MOUTH TWICE  DAILY What changed: when to take this   fluticasone 50 MCG/ACT nasal spray Commonly known as: FLONASE Place 2 sprays into both nostrils daily for 7 days. Start taking on: March 08, 2024   furosemide  40 MG tablet Commonly known as: LASIX  Take 1 tablet (40 mg total) by mouth daily.    gabapentin  600 MG tablet Commonly known as: NEURONTIN  TAKE 1 TABLET BY MOUTH 3 TIMES  DAILY   guaiFENesin  600 MG 12 hr tablet Commonly known as: MUCINEX  Take 1 tablet (600 mg total) by mouth 2 (two) times daily. What changed:  when to take this reasons to take this   hydrALAZINE  100 MG tablet Commonly known as: APRESOLINE  TAKE 1 TABLET BY MOUTH 3 TIMES  DAILY   iron  polysaccharides 150 MG capsule Commonly known as: NIFEREX TAKE 1 CAPSULE BY MOUTH EVERY DAY What changed:  how much to take when to take this   loratadine 10 MG tablet Commonly known as: CLARITIN Take 1 tablet (10 mg total) by mouth daily. Start taking on: March 08, 2024   lovastatin  40 MG tablet Commonly known as: MEVACOR  TAKE 1 TABLET BY MOUTH AT  BEDTIME   meclizine  12.5 MG tablet Commonly known as: ANTIVERT  TAKE 1 TABLET BY MOUTH THREE TIMES A DAY AS NEEDED FOR DIZZINESS What changed: See the new instructions.   nystatin  100000 UNIT/ML suspension Commonly known as: MYCOSTATIN  Swish and spit 5mL two times daily after using Symbicort . What changed:  how much to take when to take this additional instructions   OXYGEN  Inhale 4 L/min into the lungs continuous.   pantoprazole  40 MG tablet Commonly known as: PROTONIX  TAKE 1 TABLET BY MOUTH DAILY What changed: when to take this   polyethylene glycol 17 g packet Commonly known as: MIRALAX  / GLYCOLAX  Take 17 g by mouth daily as needed for mild constipation or moderate constipation.   potassium chloride  SA 20 MEQ tablet Commonly known as: Klor-Con  M20 Take 1 tablet (20 mEq total) by mouth daily.   predniSONE  20 MG tablet Commonly known as: DELTASONE  Take 2 tablets (40 mg total) by mouth daily with breakfast for 2 days, THEN 1 tablet (20 mg total) daily with breakfast for 3 days. Start taking on: March 08, 2024 What changed:  medication strength See the new instructions.   solifenacin  10 MG tablet Commonly known as: VESICARE  TAKE 1  TABLET BY MOUTH EVERY DAY   tiZANidine  4 MG tablet Commonly known as: ZANAFLEX  TAKE 1 TABLET BY MOUTH EVERY 6 HOURS AS NEEDED FOR MUSCLE SPASMS.   traMADol  50 MG tablet Commonly known as: ULTRAM  Take 1 tablet (50 mg total) by mouth every 6 (six) hours as needed. for pain What changed:  how much to take when to take this   traZODone  50 MG tablet Commonly known as: DESYREL  TAKE 1/2 TO 1 TABLET BY MOUTH AT BEDTIME AS NEEDED FOR  SLEEP What changed:  how much to take when to take this additional instructions   Trelegy Ellipta  200-62.5-25 MCG/ACT Aepb Generic drug: Fluticasone-Umeclidin-Vilant Inhale 1 Inhalation into the lungs daily.   triamcinolone  cream 0.1 % Commonly known as: KENALOG  Apply topically 2 (two) times daily. What changed:  how much to take when to take this reasons to take this       Allergies  Allergen Reactions   Benazepril  Shortness Of Breath, Swelling and Other (See Comments)    Face became swollen, plus eyes and cheeks (too)   Chlorthalidone Shortness Of Breath    Contact information for follow-up providers     [name], MD. Schedule an appointment as soon as possible for a visit in 2 week(s).   Specialties: Internal Medicine, Radiology Contact information: [address] [place] [address] [phone]         Mannam, Praveen, MD. Schedule an appointment as soon as possible for a visit in 2 week(s).   Specialty: Pulmonary Disease Contact information: [address] [place] [address] [phone]              Contact information for after-discharge care     Home Medical Care     [facility]) .   Service: Home Health Services Contact information: [address] [place]  [address] [phone]                      The results of significant diagnostics from this hospitalization (including imaging, microbiology, ancillary and  laboratory) are listed below for reference.    Significant Diagnostic Studies: DG CHEST PORT 1 VIEW Result Date: 03/06/2024 EXAM: 1 VIEW(S) XRAY OF THE CHEST 03/06/2024 01:22:00 PM COMPARISON: 03/03/2024 CLINICAL HISTORY: Hypoxia 200808 FINDINGS: LUNGS AND PLEURA: Improved aeration of lung bases. Persistent left basilar atelectasis. No pulmonary edema. No pleural effusion. No pneumothorax. HEART AND MEDIASTINUM: Cardiomegaly, unchanged. Aortic arch calcifications. BONES AND SOFT TISSUES: Old right rib fractures. IMPRESSION: 1. Improved aeration of the lung bases with persistent left basilar atelectasis. No pulmonary edema, pleural effusion, or pneumothorax. Electronically signed by: Waddell Calk MD 03/06/2024 04:15 PM EST RP Workstation: [id number]   ECHOCARDIOGRAM COMPLETE Result Date: 03/04/2024    ECHOCARDIOGRAM REPORT   Patient Name:   RENATTA Wendy Torres Date of Exam: 03/04/2024 Medical Rec #:  [id number]      Height:       64.0 in Accession #:    [id number]     Weight:       194.9 lb Date of Birth:  [birth year]      BSA:          1.935 m Patient Age:    83 years       BP:           174/77 mmHg Patient Gender: F              HR:           81 bpm. Exam Location:  Inpatient Procedure: 2D Echo, Cardiac Doppler and Color Doppler (Both Spectral and Color            Flow Doppler were utilized during procedure). Indications:    Dyspnea R06.00  History:        Patient has prior history of Echocardiogram examinations, most                 recent 04/06/2023.  Sonographer:  Tinnie Gosling RDCS Referring Phys: [id number] DAVID MANUEL ORTIZ IMPRESSIONS  1. Left ventricular ejection fraction, by estimation, is 70 to 75%. The left ventricle has hyperdynamic function. The left ventricle has no regional wall motion abnormalities. There is moderate concentric left ventricular hypertrophy. Left ventricular diastolic parameters are consistent with Grade I diastolic dysfunction (impaired relaxation).  2. Right ventricular systolic  function is normal. The right ventricular size is moderately enlarged. There is mildly elevated pulmonary artery systolic pressure.  3. Left atrial size was mildly dilated.  4. The mitral valve is normal in structure. No evidence of mitral valve regurgitation. No evidence of mitral stenosis.  5. The aortic valve was not well visualized. Aortic valve regurgitation is not visualized.  6. The inferior vena cava is dilated in size with <50% respiratory variability, suggesting right atrial pressure of 20 mmHg. Comparison(s): Prior images reviewed side by side. Right ventricle has further dilated; TR not as well assessed in this study. FINDINGS  Left Ventricle: Left ventricular ejection fraction, by estimation, is 70 to 75%. The left ventricle has hyperdynamic function. The left ventricle has no regional wall motion abnormalities. The left ventricular internal cavity size was normal in size. There is moderate concentric left ventricular hypertrophy. Left ventricular diastolic parameters are consistent with Grade I diastolic dysfunction (impaired relaxation). Right Ventricle: The right ventricular size is moderately enlarged. No increase in right ventricular wall thickness. Right ventricular systolic function is normal. There is mildly elevated pulmonary artery systolic pressure. The tricuspid regurgitant velocity is 2.48 m/s, and with an assumed right atrial pressure of 20 mmHg, the estimated right ventricular systolic pressure is 44.6 mmHg. Left Atrium: Left atrial size was mildly dilated. Right Atrium: Right atrial size was normal in size. Pericardium: There is no evidence of pericardial effusion. Mitral Valve: The mitral valve is normal in structure. No evidence of mitral valve regurgitation. No evidence of mitral valve stenosis. Tricuspid Valve: The tricuspid valve is normal in structure. Tricuspid valve regurgitation is trivial. No evidence of tricuspid stenosis. Aortic Valve: The aortic valve was not well  visualized. Aortic valve regurgitation is not visualized. Pulmonic Valve: The pulmonic valve was not well visualized. Pulmonic valve regurgitation is not visualized. No evidence of pulmonic stenosis. Aorta: The aortic root and ascending aorta are structurally normal, with no evidence of dilitation. Venous: The inferior vena cava is dilated in size with less than 50% respiratory variability, suggesting right atrial pressure of 15 mmHg. IAS/Shunts: The atrial septum is grossly normal.  LEFT VENTRICLE PLAX 2D LVIDd:         4.70 cm   Diastology LVIDs:         3.80 cm   LV e' medial:    4.57 cm/s LV PW:         1.30 cm   LV E/e' medial:  22.8 LV IVS:        1.30 cm   LV e' lateral:   8.59 cm/s LVOT diam:     2.10 cm   LV E/e' lateral: 12.1 LV SV:         95 LV SV Index:   49 LVOT Area:     3.46 cm LV IVRT:       95 msec  RIGHT VENTRICLE             IVC RV S prime:     17.00 cm/s  IVC diam: 2.60 cm TAPSE (M-mode): 2.5 cm  PULMONARY VEINS                             Diastolic Velocity: 37.20 cm/s                             S/D Velocity:       1.20                             Systolic Velocity:  43.20 cm/s LEFT ATRIUM             Index        RIGHT ATRIUM           Index LA Vol (A2C):   60.7 ml 31.37 ml/m  RA Area:     20.30 cm LA Vol (A4C):   71.9 ml 37.16 ml/m  RA Volume:   53.60 ml  27.70 ml/m LA Biplane Vol: 67.5 ml 34.88 ml/m  AORTIC VALVE LVOT Vmax:   130.00 cm/s LVOT Vmean:  85.800 cm/s LVOT VTI:    0.275 m  AORTA Ao Root diam: 2.80 cm Ao Asc diam:  3.30 cm MITRAL VALVE                TRICUSPID VALVE MV Area (PHT): 3.03 cm     TR Peak grad:   24.6 mmHg MV Decel Time: 250 msec     TR Vmax:        248.00 cm/s MV E velocity: 104.00 cm/s MV A velocity: 122.00 cm/s  SHUNTS MV E/A ratio:  0.85         Systemic VTI:  0.28 m                             Systemic Diam: 2.10 cm Stanly Leavens MD Electronically signed by Stanly Leavens MD Signature Date/Time: 03/04/2024/10:52:29 AM     Final    DG Chest Portable 1 View Result Date: 03/03/2024 CLINICAL DATA:  Shortness of breath EXAM: PORTABLE CHEST 1 VIEW COMPARISON:  November 25, 2023 FINDINGS: There is cardiomegaly with increased basilar infiltrate most likely edema. No pleural effusion or pneumothorax. IMPRESSION: Cardiomegaly with increased basilar infiltrates most likely pulmonary edema Electronically Signed   By: Nancyann Burns M.D.   On: 03/03/2024 12:59    Microbiology: No results found for this or any previous visit (from the past 240 hours).   Labs: Basic Metabolic Panel: Recent Labs  Lab 03/03/24 1204 03/04/24 0438 03/05/24 1208 03/06/24 0440 03/07/24 0414  NA 138 139 137 138 136  K 3.9 4.0 3.6 3.6 3.8  CL 99 99 97* 100 99  CO2 30 31 27 30 30   GLUCOSE 140* 135* 113* 99 107*  BUN 14 17 30* 33* 38*  CREATININE 0.86 0.86 1.03* 1.06* 1.16*  CALCIUM 10.6* 10.8* 10.5* 10.2 9.9  MG  --   --  2.5*  --   --    Liver Function Tests: Recent Labs  Lab 03/04/24 0438  AST 19  ALT 7  ALKPHOS 63  BILITOT 0.3  PROT 7.7  ALBUMIN 3.9   No results for input(s): LIPASE, AMYLASE in the last 168 hours. No results for input(s): AMMONIA in the last 168 hours. CBC: Recent Labs  Lab 03/03/24 1204 03/04/24 0438 03/05/24 1208 03/06/24 0440 03/07/24 0414  WBC 7.8 6.9 9.4  8.1 7.9  HGB 9.7* 9.6* 11.1* 10.4* 10.3*  HCT 32.3* 31.6* 36.8 34.4* 34.6*  MCV 86.6 85.9 85.8 84.9 85.4  PLT 309 296 384 338 317   Cardiac Enzymes: No results for input(s): CKTOTAL, CKMB, CKMBINDEX, TROPONINI in the last 168 hours. BNP: BNP (last 3 results) Recent Labs    09/13/23 2311 11/25/23 1050  BNP 23.9 27.3    ProBNP (last 3 results) Recent Labs    03/03/24 1204  PROBNP 148.0    CBG: No results for input(s): GLUCAP in the last 168 hours.     Signed:  Toribio Hummer MD.  [facility] 03/07/2024, 3:42 PM

## 2024-03-07 NOTE — Progress Notes (Signed)
 PT Cancellation Note  Patient Details Name: Wendy Torres MRN: [id number] DOB: [birth year]   Cancelled Treatment:    Reason Eval/Treat Not Completed: Other (comment). Pt politely declined PT this afternoon reporting with nursing confining d/c home. If pt remains in hospital PT to continue to follow acutely.   [place], PT Acute Rehab   [place] VEAR Drone 03/07/2024, 4:15 PM

## 2024-03-07 NOTE — Plan of Care (Signed)
  Problem: Education: Goal: Knowledge of General Education information will improve Description: Including pain rating scale, medication(s)/side effects and non-pharmacologic comfort measures Outcome: Completed/Met   Problem: Health Behavior/Discharge Planning: Goal: Ability to manage health-related needs will improve Outcome: Completed/Met   Problem: Clinical Measurements: Goal: Ability to maintain clinical measurements within normal limits will improve Outcome: Completed/Met Goal: Will remain free from infection Outcome: Completed/Met Goal: Diagnostic test results will improve Outcome: Completed/Met Goal: Respiratory complications will improve Outcome: Completed/Met Goal: Cardiovascular complication will be avoided Outcome: Completed/Met   Problem: Activity: Goal: Risk for activity intolerance will decrease Outcome: Completed/Met   Problem: Nutrition: Goal: Adequate nutrition will be maintained Outcome: Completed/Met   Problem: Coping: Goal: Level of anxiety will decrease Outcome: Completed/Met   Problem: Elimination: Goal: Will not experience complications related to bowel motility Outcome: Completed/Met Goal: Will not experience complications related to urinary retention Outcome: Completed/Met   Problem: Pain Managment: Goal: General experience of comfort will improve and/or be controlled Outcome: Completed/Met   Problem: Safety: Goal: Ability to remain free from injury will improve Outcome: Completed/Met   Problem: Skin Integrity: Goal: Risk for impaired skin integrity will decrease Outcome: Completed/Met   Problem: Education: Goal: Ability to demonstrate management of disease process will improve Outcome: Completed/Met Goal: Ability to verbalize understanding of medication therapies will improve Outcome: Completed/Met Goal: Individualized Educational Video(s) Outcome: Completed/Met   Problem: Activity: Goal: Capacity to carry out activities will  improve Outcome: Completed/Met   Problem: Cardiac: Goal: Ability to achieve and maintain adequate cardiopulmonary perfusion will improve Outcome: Completed/Met   Problem: Education: Goal: Knowledge of disease or condition will improve Outcome: Completed/Met Goal: Knowledge of the prescribed therapeutic regimen will improve Outcome: Completed/Met Goal: Individualized Educational Video(s) Outcome: Completed/Met   Problem: Activity: Goal: Ability to tolerate increased activity will improve Outcome: Completed/Met Goal: Will verbalize the importance of balancing activity with adequate rest periods Outcome: Completed/Met   Problem: Respiratory: Goal: Ability to maintain a clear airway will improve Outcome: Completed/Met Goal: Levels of oxygenation will improve Outcome: Completed/Met Goal: Ability to maintain adequate ventilation will improve Outcome: Completed/Met

## 2024-03-07 NOTE — Progress Notes (Signed)
 O2% Ambulation Trial   Pt ambulated in the hallway on 5LNC with sats at 90% O2.

## 2024-03-10 ENCOUNTER — Telehealth: Payer: Self-pay

## 2024-03-10 ENCOUNTER — Other Ambulatory Visit: Payer: Self-pay | Admitting: Internal Medicine

## 2024-03-10 NOTE — Transitions of Care (Post Inpatient/ED Visit) (Signed)
   03/10/2024  Name: Wendy Torres MRN: [id number] DOB: [birth year]  Today's TOC FU Call Status: Today's TOC FU Call Status:: Unsuccessful Call (1st Attempt) Unsuccessful Call (1st Attempt) Date: 03/10/24 (Daughter answered by could not talk while at work.)  Attempted to reach the patient regarding the most recent Inpatient/ED visit.   Follow Up Plan: Additional outreach attempts will be made to reach the patient to complete the Transitions of Care (Post Inpatient/ED visit) call.    Alan Ee, RN, BSN, CEN [employer]- Transition of Care Team.  [facility] [phone]

## 2024-03-10 NOTE — Telephone Encounter (Signed)
 Ok for [employer]

## 2024-03-10 NOTE — Telephone Encounter (Signed)
 Copied from CRM [id number]. Topic: Clinical - Home Health Verbal Orders >> Mar 10, 2024 11:28 AM Ashley SAUNDERS wrote: Caller/Agency: Sharlet Cleaves [facility]) Callback Number: [phone] Service Requested: Occupational Therapy - Seeking Further orders to treat  Frequency:  1 week 4  2 month 1 2 PRN Any new concerns about the patient? Yes  Hospitalized for acute resp failure,  hypoxia/ CPD, potential infection [phone]

## 2024-03-11 ENCOUNTER — Telehealth: Payer: Self-pay | Admitting: *Deleted

## 2024-03-11 ENCOUNTER — Other Ambulatory Visit: Payer: Self-pay

## 2024-03-11 NOTE — Transitions of Care (Post Inpatient/ED Visit) (Signed)
   03/11/2024  Name: Wendy Torres MRN: [id number] DOB: 12/21/1940  Today's TOC FU Call Status: Today's TOC FU Call Status:: Unsuccessful Call (2nd Attempt) Unsuccessful Call (2nd Attempt) Date: 03/11/24  Attempted to reach the patient regarding the most recent Inpatient visit.  Left HIPAA compliant voice message   Follow Up Plan: Additional outreach attempts will be made to reach the patient to complete the Transitions of Care (Post Inpatient/ED visit) call.   Pls call/ message for questions,  Zayanna Pundt Mckinney Maxamus Colao, RN, BSN, CCRN Alumnus RN Care Manager  Transitions of Care  [facility] - [facility] [phone]: direct office

## 2024-03-11 NOTE — Telephone Encounter (Signed)
 Called and gave verbals.

## 2024-03-12 ENCOUNTER — Telehealth: Payer: Self-pay | Admitting: Oncology

## 2024-03-12 NOTE — Telephone Encounter (Signed)
 Patient's daughter called in stating patient has been in the hospital and wants to reschedule appointment from 03/13/2024 to December 2025. Patient aware of date/time change.

## 2024-03-13 ENCOUNTER — Telehealth: Payer: Self-pay

## 2024-03-13 ENCOUNTER — Inpatient Hospital Stay

## 2024-03-13 ENCOUNTER — Inpatient Hospital Stay: Admitting: Oncology

## 2024-03-13 NOTE — Transitions of Care (Post Inpatient/ED Visit) (Signed)
   03/13/2024  Name: OLUWASEUN BRUYERE MRN: [id number] DOB: 1940-11-30  Today's TOC FU Call Status: Today's TOC FU Call Status:: Successful TOC FU Call Completed TOC FU Call Complete Date: 03/13/24 (Spoke with patient's daughter - Patient has pulmonary hospital follow up appointment tomorrow and 03/20/24 Hospital follow up with PCP - declined TOC Program - also states she is at work)  Patient's Name and Date of Birth confirmed. DOB, Name  Shona Prow RN, CCM [place]  VBCI-Population Health RN Care Manager [phone]

## 2024-03-14 ENCOUNTER — Encounter: Payer: Self-pay | Admitting: Pulmonary Disease

## 2024-03-14 ENCOUNTER — Ambulatory Visit: Admitting: Pulmonary Disease

## 2024-03-14 VITALS — BP 124/67 | HR 55 | Temp 98.3°F | Ht 64.0 in | Wt 191.0 lb

## 2024-03-14 DIAGNOSIS — J439 Emphysema, unspecified: Secondary | ICD-10-CM

## 2024-03-14 DIAGNOSIS — J4489 Other specified chronic obstructive pulmonary disease: Secondary | ICD-10-CM | POA: Diagnosis not present

## 2024-03-14 NOTE — Patient Instructions (Signed)
  VISIT SUMMARY: You had a follow-up visit after your recent hospitalization for pulmonary edema. We discussed your ongoing management for heart failure, COPD, and your recovery progress.  YOUR PLAN: ACUTE DIASTOLIC HEART FAILURE WITH PULMONARY EDEMA: You were recently hospitalized for heart failure with fluid in your lungs. -Continue taking Lasix  to manage fluid levels. -Keep your oxygen  saturation between 90-95% using supplemental oxygen .  CHRONIC OBSTRUCTIVE PULMONARY DISEASE (COPD) WITH ACUTE HYPOXEMIC RESPIRATORY FAILURE: You have COPD and recently experienced severe breathing issues requiring hospitalization. -Continue using your Trelegy inhaler. -Use supplemental oxygen  at 4 liters per minute, and increase to 5 liters when you are moving around. -Use the nebulizer and Dionebs as prescribed. -We may consider adding Atrovent  nebulized medication if needed.  DECONDITIONING POST-HOSPITALIZATION: You are experiencing weakness and reduced mobility after your hospital stay. -Continue with home physical therapy. -Focus on breathing techniques and mobility exercises.

## 2024-03-14 NOTE — Progress Notes (Signed)
 JEREMIE ABDELAZIZ    [id number]    02-14-1941  Primary Care Physician:John, Lynwood ORN, MD  Referring Physician: [name], MD [address] [place],  [place] [address]  Chief complaint: Follow-up for COPD  HPI: 83 y.o.  with history of emphysema, hypertension, diastolic heart failure.  Referred here for evaluation of emphysema Complaints of dyspnea with activity, no symptoms at rest.  Denies any cough, sputum production, wheezing She was hospitalized in April 2018 for COPD exacerbation, CHF exacerbation and has been on supplemental oxygen  since then Symbicort  changed to Stiolto in 2019 and she is doing well with this change.  Then she got back to Symbicort  due to insurance reasons  Interim history: Discussed the use of AI scribe software for clinical note transcription with the patient, who gave verbal consent to proceed. History of Present Illness Chi Woodham Nath is an 83 year old female with COPD and acute diastolic heart failure who presents for follow-up after recent hospitalization for pulmonary edema. She is accompanied by her daughter.  Pulmonary edema and hypoxemia - Hospitalized from November 3rd to November 7th for COPD exacerbation and CHF with pulmonary edema with oxygen  saturation levels dropping to 60% during movement - Received Lasix  and prednisone  during hospitalization, completed prednisone  course on November 13th - Currently feels improved since discharge - Uses supplemental oxygen  at 4 liters per minute, increasing to 5 liters with exertion  Chronic obstructive pulmonary disease (copd) management - Continues Trelegy inhaler therapy - Participating in home physical therapy to improve strength and manage oxygen  levels - Able to ambulate with a walker  Lightheadedness - Experiences occasional episodes of lightheadedness since hospital discharge   Relevant pulmonary history Pets: No pets  Occupation: Homemaker Exposures: No known exposures.  Reports a  mildew problem in the basement but she hardly goes there Smoking history: 25-pack-year smoker.  Quit in 1982 Travel history: No significant travel Relevant family history: Father had emphysema  Outpatient Encounter Medications as of 03/14/2024  Medication Sig   acetaminophen  (TYLENOL ) 325 MG tablet Take 2 tablets (650 mg total) by mouth every 6 (six) hours as needed for mild pain (or Fever >/= 101).   albuterol  (VENTOLIN  HFA) 108 (90 Base) MCG/ACT inhaler Inhale 2 puffs into the lungs 3 (three) times daily for 3 days, THEN 2 puffs every 4 (four) hours as needed for wheezing or shortness of breath.   ALPRAZolam  (XANAX ) 0.25 MG tablet Take 1 tablet (0.25 mg total) by mouth 2 (two) times daily as needed. (Patient taking differently: Take 0.25 mg by mouth 2 (two) times daily as needed for anxiety.)   amLODipine -valsartan  (EXFORGE ) 10-320 MG tablet Take 1 tablet by mouth daily.   aspirin  EC 81 MG tablet Take 1 tablet (81 mg total) by mouth daily. Swallow whole.   cloNIDine  (CATAPRES ) 0.2 MG tablet TAKE 1 TABLET BY MOUTH TWICE  DAILY (Patient taking differently: Take 0.2 mg by mouth in the morning and at bedtime.)   fluticasone (FLONASE) 50 MCG/ACT nasal spray Place 2 sprays into both nostrils daily for 7 days.   Fluticasone-Umeclidin-Vilant (TRELEGY ELLIPTA ) 200-62.5-25 MCG/ACT AEPB Inhale 1 Inhalation into the lungs daily.   furosemide  (LASIX ) 40 MG tablet Take 1 tablet (40 mg total) by mouth daily.   gabapentin  (NEURONTIN ) 600 MG tablet TAKE 1 TABLET BY MOUTH 3 TIMES  DAILY   guaiFENesin  (MUCINEX ) 600 MG 12 hr tablet Take 1 tablet (600 mg total) by mouth 2 (two) times daily. (Patient taking differently:  Take 600 mg by mouth 2 (two) times daily as needed for to loosen phlegm or cough.)   hydrALAZINE  (APRESOLINE ) 100 MG tablet TAKE 1 TABLET BY MOUTH 3 TIMES  DAILY   iron  polysaccharides (NIFEREX) 150 MG capsule TAKE 1 CAPSULE BY MOUTH EVERY DAY (Patient taking differently: Take 150 mg by mouth daily  with breakfast.)   loratadine (CLARITIN) 10 MG tablet Take 1 tablet (10 mg total) by mouth daily.   meclizine  (ANTIVERT ) 12.5 MG tablet TAKE 1 TABLET BY MOUTH THREE TIMES A DAY AS NEEDED FOR DIZZINESS (Patient taking differently: Take 12.5 mg by mouth 3 (three) times daily as needed for dizziness.)   nystatin  (MYCOSTATIN ) 100000 UNIT/ML suspension Swish and spit 5mL two times daily after using Symbicort . (Patient taking differently: 5 mLs See admin instructions. Swish and spit 5 ml's once a day after using Trelegy Ellipta  in the morning)   OXYGEN  Inhale 4 L/min into the lungs continuous.   pantoprazole  (PROTONIX ) 40 MG tablet TAKE 1 TABLET BY MOUTH DAILY (Patient taking differently: Take 40 mg by mouth daily before breakfast.)   polyethylene glycol (MIRALAX  / GLYCOLAX ) 17 g packet Take 17 g by mouth daily as needed for mild constipation or moderate constipation.   potassium chloride  SA (KLOR-CON  M20) 20 MEQ tablet Take 1 tablet (20 mEq total) by mouth daily.   predniSONE  (DELTASONE ) 20 MG tablet Take 2 tablets (40 mg total) by mouth daily with breakfast for 2 days, THEN 1 tablet (20 mg total) daily with breakfast for 3 days.   solifenacin  (VESICARE ) 10 MG tablet TAKE 1 TABLET BY MOUTH EVERY DAY   tiZANidine  (ZANAFLEX ) 4 MG tablet TAKE 1 TABLET BY MOUTH EVERY 6 HOURS AS NEEDED FOR MUSCLE SPASMS.   traMADol  (ULTRAM ) 50 MG tablet Take 1 tablet (50 mg total) by mouth every 6 (six) hours as needed. for pain (Patient taking differently: Take 100 mg by mouth in the morning and at bedtime. for pain)   traZODone  (DESYREL ) 50 MG tablet TAKE 1/2 TO 1 TABLET BY MOUTH AT BEDTIME AS NEEDED FOR SLEEP (Patient taking differently: Take 50 mg by mouth at bedtime.)   triamcinolone  cream (KENALOG ) 0.1 % Apply topically 2 (two) times daily. (Patient taking differently: Apply 1 Application topically 2 (two) times daily as needed (for itching).)   lovastatin  (MEVACOR ) 40 MG tablet TAKE 1 TABLET BY MOUTH AT  BEDTIME (Patient  not taking: Reported on 03/14/2024)   No facility-administered encounter medications on file as of 03/14/2024.   Vitals:   03/14/24 1042  BP: 124/67  Pulse: (!) 55  Temp: 98.3 F (36.8 C)  Height: 5' 4 (1.626 m)  Weight: 191 lb (86.6 kg)  SpO2: 96%  TempSrc: Oral  BMI (Calculated): 32.77    Physical Exam GEN: No acute distress. CV: Regular rate and rhythm, no murmurs. LUNGS: Clear to auscultation bilaterally, normal respiratory effort. SKIN JOINTS: Warm and dry, no rash.   Data Reviewed: Imaging CT chest 10/17/2008- centrilobular emphysema in the upper lobes, 2 mm nodule in the right lung.  Chest x-ray 06/12/2021-no active cardiopulmonary disease CT chest 02/15/2023-radiology read is pending.  By my review it appears stable with emphysema, stable scarring/atelectasis at the base Chest x-ray 03/03/2024-cardiomegaly with bibasilar infiltrates, pulm edema Chest x-ray 03/06/2024-improved aeration of the lungs with basilar atelectasis I have reviewed the images personally  PFTs  12/21/2017 FVC 1.75 [9%), FEV1 1.10 [73%], F/F 63, TLC 94%, DLCO 41% Moderate obstruction, severe diffusion defect.  6-minute walk test 02/04/2018 96 m, Patient  had to stop for desats.  Titrated to 3 L oxygen   Labs CBC 02/04/2018-WBC 7.4, eos 2.5%, absolute eosinophil count 185 Alpha-1 antitrypsin 12/05/2017-159, PI MM IgE 12/05/2017-224  Cardiac: Echocardiogram 01/24/2021 LVEF 60 to 65%, mild concentric LVH Severely elevated PA systolic pressure, RVSP 66  Sleep: Sleep study 04/28/2021 No OSA, AHI 0.  Noted oxygen  desaturation.  Assessment & Plan Chronic obstructive pulmonary disease (COPD) with acute hypoxemic respiratory failure COPD with recent acute hypoxemic respiratory failure requiring hospitalization. Currently on Trelegy inhaler and supplemental oxygen . No prior hospitalizations for COPD this year. Physical therapy initiated post-hospitalization. Nebulizer and Dionebs prescribed for home use.  Consideration of Atrovent  nebulized medication pending response to current treatment. - Continue Trelegy inhaler - Maintain supplemental oxygen  at 4 liters, increase to 5 liters during ambulation - Ordered nebulizer and Dionebs for home use - Will consider Atrovent  nebulized medication if needed  Acute diastolic heart failure with pulmonary edema Recent hospitalization for acute diastolic heart failure with pulmonary edema, confirmed by chest X-ray. Currently on Lasix  for fluid management. Oxygen  saturation maintained between 90-95% with supplemental oxygen . - Continue Lasix  for fluid management - Maintain oxygen  saturation between 90-95% with supplemental oxygen   Deconditioning post-hospitalization Deconditioning following recent hospitalization. Physical therapy initiated to improve strength and mobility. Not ready for pulmonary rehabilitation at this time. - Continue home physical therapy - Focus on breathing techniques and mobility exercises   Health maintenance 12/03/2013-Prevnar 12/08/2018-Pneumovax  Plan/Recommendations: - Continue Trelegy - Continue supplemental oxygen  - Add DuoNebs - Physical therapy  I personally spent a total of 35 minutes in the care of the patient today including preparing to see the patient, getting/reviewing separately obtained history, referring and communicating with other health care professionals, documenting clinical information in the EHR, independently interpreting results, and coordinating care.   Lonna Coder MD [facility] 03/14/2024, 10:52 AM  CC: [name], MD

## 2024-03-16 ENCOUNTER — Encounter: Payer: Self-pay | Admitting: Pulmonary Disease

## 2024-03-18 ENCOUNTER — Telehealth: Payer: Self-pay

## 2024-03-18 NOTE — Telephone Encounter (Signed)
 Copied from CRM [id number]. Topic: Appointments - Scheduling Inquiry for Clinic >> Mar 18, 2024 11:28 AM Victoria A wrote: Reason for CRM: Patients daughter Samira Acero called to cancelled appointment on 03/20/24 and reschedule for 03/21/24 however there were not any appts available for Dr.John. Katrina said that she would like for Dr.John's nurse to call because when they first spoke there were a lot of open slots. Please call [phone]

## 2024-03-20 ENCOUNTER — Ambulatory Visit: Payer: Self-pay | Admitting: Internal Medicine

## 2024-03-20 ENCOUNTER — Encounter: Payer: Self-pay | Admitting: Internal Medicine

## 2024-03-20 ENCOUNTER — Ambulatory Visit: Admitting: Internal Medicine

## 2024-03-20 VITALS — BP 128/62 | HR 50 | Temp 98.0°F | Ht 64.0 in | Wt 194.0 lb

## 2024-03-20 DIAGNOSIS — M170 Bilateral primary osteoarthritis of knee: Secondary | ICD-10-CM | POA: Diagnosis not present

## 2024-03-20 DIAGNOSIS — J441 Chronic obstructive pulmonary disease with (acute) exacerbation: Secondary | ICD-10-CM | POA: Diagnosis not present

## 2024-03-20 DIAGNOSIS — I5032 Chronic diastolic (congestive) heart failure: Secondary | ICD-10-CM

## 2024-03-20 DIAGNOSIS — J9611 Chronic respiratory failure with hypoxia: Secondary | ICD-10-CM | POA: Diagnosis not present

## 2024-03-20 DIAGNOSIS — M150 Primary generalized (osteo)arthritis: Secondary | ICD-10-CM

## 2024-03-20 LAB — BASIC METABOLIC PANEL WITH GFR
BUN: 17 mg/dL (ref 6–23)
CO2: 35 meq/L — ABNORMAL HIGH (ref 19–32)
Calcium: 10.8 mg/dL — ABNORMAL HIGH (ref 8.4–10.5)
Chloride: 96 meq/L (ref 96–112)
Creatinine, Ser: 1.17 mg/dL (ref 0.40–1.20)
GFR: 42.66 mL/min — ABNORMAL LOW (ref >60.00)
Glucose, Bld: 93 mg/dL (ref 70–99)
Potassium: 4.3 meq/L (ref 3.5–5.1)
Sodium: 135 meq/L (ref 135–145)

## 2024-03-20 MED ORDER — MECLIZINE HCL 12.5 MG PO TABS: 12.5000 mg | ORAL_TABLET | Freq: Three times a day (TID) | ORAL | 1 refills | Status: AC | PRN

## 2024-03-20 MED ORDER — TRAMADOL HCL 50 MG PO TABS: 50.0000 mg | ORAL_TABLET | Freq: Four times a day (QID) | ORAL | 2 refills | Status: DC | PRN

## 2024-03-20 MED ORDER — TIZANIDINE HCL 4 MG PO TABS: 4.0000 mg | ORAL_TABLET | Freq: Four times a day (QID) | ORAL | 1 refills | Status: DC | PRN

## 2024-03-20 NOTE — Progress Notes (Signed)
 Patient ID: Wendy Torres, female   DOB: December 09, 1940, 83 y.o.   MRN: [id number]        Chief Complaint: follow up post hospn nov 3 - 7 2025 with acute on chronic hypoxic resp failure with o2 increased to 5L [place] continuous, COPD exacerbation, acute diastolic CHF pulm edema       HPI:  Wendy Torres is a 83 y.o. female here with daughter, overall doing about the same, has already seen pulmonary and remains on 5L [place] but 4L at rest and to continue same med tx; Pt wt is stable on current regimen, has only trace right pedal edema, and Pt denies chest pain, increased sob or doe, wheezing, orthopnea, PND, increased LE swelling, palpitations, dizziness or syncope.   Pt denies polydipsia, polyuria, or new focal neuro s/s.    Pt denies fever, wt loss, night sweats, loss of appetite, or other constitutional symptoms    Pt denies fever, wt loss, night sweats, loss of appetite, or other constitutional symptoms  No falls.        Wt Readings from Last 3 Encounters:  03/20/24 194 lb (88 kg)  03/14/24 191 lb (86.6 kg)  03/07/24 192 lb 3.9 oz (87.2 kg)   BP Readings from Last 3 Encounters:  03/20/24 128/62  03/14/24 124/67  03/07/24 (!) 118/54         Past Medical History:  Diagnosis Date   ALLERGIC RHINITIS 12/09/2006   ANEMIA 01/14/2009   Anxiety    Arthritis    ASTHMA 12/09/2006   Cataract    bilateral -removed   CHF (congestive heart failure) (HCC)    Chronic pain syndrome 02/16/2009   COPD 12/09/2006   DEGENERATIVE JOINT DISEASE 12/09/2006   DIZZINESS 03/30/2010   DYSPNEA 01/27/2010   with heavy exertion   FREQUENCY, URINARY 03/30/2010   GERD 12/09/2006   HELICOBACTER PYLORI GASTRITIS, HX OF 12/09/2006   HIATAL HERNIA 03/01/2010   HYPERLIPIDEMIA 04/18/2007   HYPERSOMNIA 08/31/2008   HYPERTENSION 12/09/2006   INSOMNIA-SLEEP DISORDER-UNSPEC 11/05/2008   LOW BACK PAIN 12/09/2006   Morbid obesity (HCC) 12/09/2006   Nonspecific (abnormal) findings on radiological and other examination of body  structure 11/05/2008   OSTEOPOROSIS 12/09/2006   OVERACTIVE BLADDER 04/14/2008   PALPITATIONS, RECURRENT 01/27/2010   Stroke (HCC)    pt. stated light one years ago   SYNCOPE 11/05/2008   TRANSIENT ISCHEMIC ATTACK, HX OF 12/09/2006   Past Surgical History:  Procedure Laterality Date   BIOPSY  01/18/2023   Procedure: BIOPSY;  Surgeon: Aneita Gwendlyn DASEN, MD;  Location: [facility];  Service: Gastroenterology;;   COLONOSCOPY     ESOPHAGOGASTRODUODENOSCOPY (EGD) WITH PROPOFOL  N/A 01/18/2023   Procedure: ESOPHAGOGASTRODUODENOSCOPY (EGD) WITH PROPOFOL ;  Surgeon: Aneita Gwendlyn DASEN, MD;  Location: [facility];  Service: Gastroenterology;  Laterality: N/A;   HOT HEMOSTASIS N/A 01/18/2023   Procedure: HOT HEMOSTASIS (ARGON PLASMA COAGULATION/BICAP);  Surgeon: Aneita Gwendlyn DASEN, MD;  Location: [facility];  Service: Gastroenterology;  Laterality: N/A;   LUMBAR DISC SURGERY     TONSILLECTOMY     as a child   TOTAL HIP ARTHROPLASTY Right 11/06/2012   Procedure: RIGHT TOTAL HIP ARTHROPLASTY WITH ACETABULAR AUTOGRAFT;  Surgeon: Dempsey LULLA Moan, MD;  Location: [facility];  Service: Orthopedics;  Laterality: Right;   TOTAL HIP ARTHROPLASTY Left 02/18/2014   Procedure: LEFT TOTAL HIP ARTHROPLASTY;  Surgeon: Dempsey Moan LULLA, MD;  Location: [facility];  Service: Orthopedics;  Laterality: Left;   TUBAL LIGATION  reports that she quit smoking about 51 years ago. Her smoking use included cigarettes. She started smoking about 76 years ago. She has a 12.5 pack-year smoking history. She has never used smokeless tobacco. She reports that she does not drink alcohol  and does not use drugs. family history includes Heart disease in her mother; Hypertension in her brother; Lung cancer in her father. Allergies  Allergen Reactions   Benazepril  Shortness Of Breath, Swelling and Other (See Comments)    Face became swollen, plus eyes and cheeks (too)   Chlorthalidone Shortness Of Breath   Current Outpatient Medications on File  Prior to Visit  Medication Sig Dispense Refill   acetaminophen  (TYLENOL ) 325 MG tablet Take 2 tablets (650 mg total) by mouth every 6 (six) hours as needed for mild pain (or Fever >/= 101). 40 tablet 0   albuterol  (VENTOLIN  HFA) 108 (90 Base) MCG/ACT inhaler Inhale 2 puffs into the lungs 3 (three) times daily for 3 days, THEN 2 puffs every 4 (four) hours as needed for wheezing or shortness of breath.     ALPRAZolam  (XANAX ) 0.25 MG tablet Take 1 tablet (0.25 mg total) by mouth 2 (two) times daily as needed. (Patient taking differently: Take 0.25 mg by mouth 2 (two) times daily as needed for anxiety.) 60 tablet 2   amLODipine -valsartan  (EXFORGE ) 10-320 MG tablet Take 1 tablet by mouth daily. 90 tablet 3   aspirin  EC 81 MG tablet Take 1 tablet (81 mg total) by mouth daily. Swallow whole.     cloNIDine  (CATAPRES ) 0.2 MG tablet TAKE 1 TABLET BY MOUTH TWICE  DAILY (Patient taking differently: Take 0.2 mg by mouth in the morning and at bedtime.) 200 tablet 2   Fluticasone -Umeclidin-Vilant (TRELEGY ELLIPTA ) 200-62.5-25 MCG/ACT AEPB Inhale 1 Inhalation into the lungs daily. 28 each 1   furosemide  (LASIX ) 40 MG tablet Take 1 tablet (40 mg total) by mouth daily. 90 tablet 3   gabapentin  (NEURONTIN ) 600 MG tablet TAKE 1 TABLET BY MOUTH 3 TIMES  DAILY 300 tablet 2   guaiFENesin  (MUCINEX ) 600 MG 12 hr tablet Take 1 tablet (600 mg total) by mouth 2 (two) times daily. (Patient taking differently: Take 600 mg by mouth 2 (two) times daily as needed for to loosen phlegm or cough.) 30 tablet 0   hydrALAZINE  (APRESOLINE ) 100 MG tablet TAKE 1 TABLET BY MOUTH 3 TIMES  DAILY 270 tablet 2   iron  polysaccharides (NIFEREX) 150 MG capsule TAKE 1 CAPSULE BY MOUTH EVERY DAY (Patient taking differently: Take 150 mg by mouth daily with breakfast.) 90 capsule 1   loratadine  (CLARITIN ) 10 MG tablet Take 1 tablet (10 mg total) by mouth daily. 30 tablet 0   lovastatin  (MEVACOR ) 40 MG tablet TAKE 1 TABLET BY MOUTH AT  BEDTIME 100 tablet  2   nystatin  (MYCOSTATIN ) 100000 UNIT/ML suspension Swish and spit 5mL two times daily after using Symbicort . (Patient taking differently: 5 mLs See admin instructions. Swish and spit 5 ml's once a day after using Trelegy Ellipta  in the morning) 473 mL 5   OXYGEN  Inhale 4 L/min into the lungs continuous.     pantoprazole  (PROTONIX ) 40 MG tablet TAKE 1 TABLET BY MOUTH DAILY (Patient taking differently: Take 40 mg by mouth daily before breakfast.) 100 tablet 2   polyethylene glycol (MIRALAX  / GLYCOLAX ) 17 g packet Take 17 g by mouth daily as needed for mild constipation or moderate constipation.     potassium chloride  SA (KLOR-CON  M20) 20 MEQ tablet Take 1 tablet (20 mEq  total) by mouth daily. 90 tablet 3   solifenacin  (VESICARE ) 10 MG tablet TAKE 1 TABLET BY MOUTH EVERY DAY 90 tablet 2   traZODone  (DESYREL ) 50 MG tablet TAKE 1/2 TO 1 TABLET BY MOUTH AT BEDTIME AS NEEDED FOR SLEEP (Patient taking differently: Take 50 mg by mouth at bedtime.) 100 tablet 1   triamcinolone  cream (KENALOG ) 0.1 % Apply topically 2 (two) times daily. (Patient taking differently: Apply 1 Application topically 2 (two) times daily as needed (for itching).) 30 g 1   fluticasone (FLONASE) 50 MCG/ACT nasal spray Place 2 sprays into both nostrils daily for 7 days. 11.1 mL 0   No current facility-administered medications on file prior to visit.        ROS:  All others reviewed and negative.  Objective        PE:  BP 128/62 (BP Location: Right Arm, Patient Position: Sitting, Cuff Size: Normal)   Pulse (!) 50   Temp 98 F (36.7 C) (Oral)   Ht 5' 4 (1.626 m)   Wt 194 lb (88 kg)   SpO2 92%   BMI 33.30 kg/m                 Constitutional: Pt appears in NAD               HENT: Head: NCAT.                Right Ear: External ear normal.                 Left Ear: External ear normal.                Eyes: . Pupils are equal, round, and reactive to light. Conjunctivae and EOM are normal               Nose: without d/c or  deformity               Neck: Neck supple. Gross normal ROM               Cardiovascular: Normal rate and regular rhythm.                 Pulmonary/Chest: Effort normal and breath sounds without rales or wheezing.                Abd:  Soft, NT, ND, + BS, no organomegaly               Neurological: Pt is alert. At baseline orientation, motor grossly intact               Skin: Skin is warm. No rashes, no other new lesions, LE edema - trace right pedal edema               Psychiatric: Pt behavior is normal without agitation   Micro: none  Cardiac tracings I have personally interpreted today:  none  Pertinent Radiological findings (summarize): none   Lab Results  Component Value Date   WBC 7.9 03/07/2024   HGB 10.3 (L) 03/07/2024   HCT 34.6 (L) 03/07/2024   PLT 317 03/07/2024   GLUCOSE 107 (H) 03/07/2024   CHOL 158 11/21/2023   TRIG 67.0 11/21/2023   HDL 66.30 11/21/2023   LDLCALC 79 11/21/2023   ALT 7 03/04/2024   AST 19 03/04/2024   NA 136 03/07/2024   K 3.8 03/07/2024   CL 99 03/07/2024   CREATININE 1.16 (H) 03/07/2024   BUN 38 (H)  03/07/2024   CO2 30 03/07/2024   TSH 1.48 11/21/2023   INR 1.0 09/13/2023   HGBA1C 5.7 11/09/2022   Assessment/Plan:  SAMANTHAMARIE EZZELL is a 83 y.o. Black or African American [2] female with  has a past medical history of ALLERGIC RHINITIS (12/09/2006), ANEMIA (01/14/2009), Anxiety, Arthritis, ASTHMA (12/09/2006), Cataract, CHF (congestive heart failure) (HCC), Chronic pain syndrome (02/16/2009), COPD (12/09/2006), DEGENERATIVE JOINT DISEASE (12/09/2006), DIZZINESS (03/30/2010), DYSPNEA (01/27/2010), FREQUENCY, URINARY (03/30/2010), GERD (12/09/2006), HELICOBACTER PYLORI GASTRITIS, HX OF (12/09/2006), HIATAL HERNIA (03/01/2010), HYPERLIPIDEMIA (04/18/2007), HYPERSOMNIA (08/31/2008), HYPERTENSION (12/09/2006), INSOMNIA-SLEEP DISORDER-UNSPEC (11/05/2008), LOW BACK PAIN (12/09/2006), Morbid obesity (HCC) (12/09/2006), Nonspecific (abnormal) findings on  radiological and other examination of body structure (11/05/2008), OSTEOPOROSIS (12/09/2006), OVERACTIVE BLADDER (04/14/2008), PALPITATIONS, RECURRENT (01/27/2010), Stroke (HCC), SYNCOPE (11/05/2008), and TRANSIENT ISCHEMIC ATTACK, HX OF (12/09/2006).  Chronic heart failure with preserved ejection fraction (HFpEF) (HCC) Overall stable, continue current med tx, for BMP today  Chronic respiratory failure with hypoxia (HCC) Overall stable, cont 5L [place] with exertion (4 with rest per pulmonary as well).  COPD with acute exacerbation (HCC) Stable, has already seen per pulmonary, continue current med tx  DJD (degenerative joint disease) of knee Chronic stable, cont tramadol  prn  Followup: Return in about 6 months (around 09/17/2024).  Lynwood Rush, MD 03/20/2024 11:46 AM [facility] - [address] [facility]

## 2024-03-20 NOTE — Patient Instructions (Signed)
 Please continue all other medications as before, and refills have been done if requested.  Please have the pharmacy call with any other refills you may need.  Please continue your efforts at being more active, low cholesterol diet, and weight control.  Please keep your appointments with your specialists as you may have planned  Please go to the LAB at the blood drawing area for the tests to be done  You will be contacted by phone if any changes need to be made immediately.  Otherwise, you will receive a letter about your results with an explanation, but please check with MyChart first.  Please make an Appointment to return in 6 months, or sooner if needed

## 2024-03-20 NOTE — Assessment & Plan Note (Signed)
 Overall stable, cont 5L  with exertion (4 with rest per pulmonary as well).

## 2024-03-20 NOTE — Assessment & Plan Note (Signed)
 Chronic stable, cont tramadol  prn ?

## 2024-03-20 NOTE — Assessment & Plan Note (Signed)
 Stable, has already seen per pulmonary, continue current med tx

## 2024-03-20 NOTE — Progress Notes (Signed)
 The test results show that your current treatment is OK, as the tests are stable.  Please continue the same plan.  There is no other need for change of treatment or further evaluation based on these results, at this time.  thanks

## 2024-03-20 NOTE — Assessment & Plan Note (Signed)
 Overall stable, continue current med tx, for BMP today

## 2024-03-24 MED ORDER — IPRATROPIUM-ALBUTEROL 0.5-2.5 (3) MG/3ML IN SOLN: 3.0000 mL | RESPIRATORY_TRACT | 5 refills | Status: AC | PRN

## 2024-03-25 NOTE — Telephone Encounter (Signed)
 Agree with eval in ED if O2 sat is dropping to 70s

## 2024-03-25 NOTE — Telephone Encounter (Signed)
 Please advise will call to schedule an appointment to reevaluate oxygen 

## 2024-03-25 NOTE — Telephone Encounter (Signed)
 I called and spoke with the pts daughter woodie [facility]) and advised that the pt be evaluated in the ED and pts daughter stated she would call her other sister who is currently with the pt and inform her of this.  Will also await Dr. Jiles response.  Pts daughter stated pt is using 5L and O2 is dropping in low 70s.

## 2024-03-27 ENCOUNTER — Other Ambulatory Visit: Payer: Self-pay | Admitting: Pulmonary Disease

## 2024-03-30 ENCOUNTER — Emergency Department (HOSPITAL_COMMUNITY)

## 2024-03-30 ENCOUNTER — Other Ambulatory Visit: Payer: Self-pay

## 2024-03-30 ENCOUNTER — Inpatient Hospital Stay (HOSPITAL_COMMUNITY)
Admission: EM | Admit: 2024-03-30 | Discharge: 2024-04-01 | DRG: 190 | Disposition: A | Attending: Internal Medicine | Admitting: Internal Medicine

## 2024-03-30 ENCOUNTER — Encounter (HOSPITAL_COMMUNITY): Payer: Self-pay

## 2024-03-30 DIAGNOSIS — I5033 Acute on chronic diastolic (congestive) heart failure: Secondary | ICD-10-CM | POA: Insufficient documentation

## 2024-03-30 DIAGNOSIS — J9621 Acute and chronic respiratory failure with hypoxia: Secondary | ICD-10-CM | POA: Insufficient documentation

## 2024-03-30 DIAGNOSIS — J189 Pneumonia, unspecified organism: Secondary | ICD-10-CM

## 2024-03-30 DIAGNOSIS — J441 Chronic obstructive pulmonary disease with (acute) exacerbation: Principal | ICD-10-CM | POA: Diagnosis present

## 2024-03-30 LAB — CBC
HCT: 33 % — ABNORMAL LOW (ref 36.0–46.0)
Hemoglobin: 10.2 g/dL — ABNORMAL LOW (ref 12.0–15.0)
MCH: 26.2 pg (ref 26.0–34.0)
MCHC: 30.9 g/dL (ref 30.0–36.0)
MCV: 84.6 fL (ref 80.0–100.0)
Platelets: 292 K/uL (ref 150–400)
RBC: 3.9 MIL/uL (ref 3.87–5.11)
RDW: 15.2 % (ref 11.5–15.5)
WBC: 5.8 K/uL (ref 4.0–10.5)
nRBC: 0 % (ref 0.0–0.2)

## 2024-03-30 LAB — BASIC METABOLIC PANEL WITH GFR
Anion gap: 9 (ref 5–15)
BUN: 15 mg/dL (ref 8–23)
CO2: 30 mmol/L (ref 22–32)
Calcium: 10.4 mg/dL — ABNORMAL HIGH (ref 8.9–10.3)
Chloride: 98 mmol/L (ref 98–111)
Creatinine, Ser: 1.14 mg/dL — ABNORMAL HIGH (ref 0.44–1.00)
GFR, Estimated: 47 mL/min — ABNORMAL LOW (ref >60)
Glucose, Bld: 126 mg/dL — ABNORMAL HIGH (ref 70–99)
Potassium: 4 mmol/L (ref 3.5–5.1)
Sodium: 137 mmol/L (ref 135–145)

## 2024-03-30 LAB — TROPONIN T, HIGH SENSITIVITY
Troponin T High Sensitivity: 38 ng/L — ABNORMAL HIGH (ref 0–19)
Troponin T High Sensitivity: 39 ng/L — ABNORMAL HIGH (ref 0–19)

## 2024-03-30 LAB — PRO BRAIN NATRIURETIC PEPTIDE: Pro Brain Natriuretic Peptide: 174 pg/mL (ref <300.0)

## 2024-03-30 LAB — RESP PANEL BY RT-PCR (RSV, FLU A&B, COVID)  RVPGX2
Influenza A by PCR: NEGATIVE
Influenza B by PCR: NEGATIVE
Resp Syncytial Virus by PCR: NEGATIVE
SARS Coronavirus 2 by RT PCR: NEGATIVE

## 2024-03-30 MED ORDER — AMLODIPINE BESYLATE 10 MG PO TABS
10.0000 mg | ORAL_TABLET | Freq: Every day | ORAL | Status: DC
  Administered 2024-03-31 – 2024-04-01 (×2): 10 mg via ORAL
  Filled 2024-03-30 (×3): qty 1

## 2024-03-30 MED ORDER — PROCHLORPERAZINE EDISYLATE 10 MG/2ML IJ SOLN: 5.0000 mg | Freq: Four times a day (QID) | INTRAMUSCULAR | Status: DC | PRN

## 2024-03-30 MED ORDER — METHYLPREDNISOLONE SODIUM SUCC 125 MG IJ SOLR
125.0000 mg | Freq: Once | INTRAMUSCULAR | Status: AC
  Administered 2024-03-30: 125 mg via INTRAVENOUS
  Filled 2024-03-30: qty 2

## 2024-03-30 MED ORDER — AMLODIPINE BESYLATE-VALSARTAN 10-320 MG PO TABS: 1.0000 | ORAL_TABLET | Freq: Every day | ORAL | Status: DC

## 2024-03-30 MED ORDER — CLONIDINE HCL 0.2 MG PO TABS
0.2000 mg | ORAL_TABLET | Freq: Two times a day (BID) | ORAL | Status: DC
  Administered 2024-03-31 – 2024-04-01 (×3): 0.2 mg via ORAL
  Filled 2024-03-30 (×4): qty 1

## 2024-03-30 MED ORDER — POLYETHYLENE GLYCOL 3350 17 G PO PACK: 17.0000 g | PACK | Freq: Every day | ORAL | Status: DC | PRN

## 2024-03-30 MED ORDER — MELATONIN 5 MG PO TABS: 5.0000 mg | ORAL_TABLET | Freq: Every evening | ORAL | Status: DC | PRN

## 2024-03-30 MED ORDER — IRBESARTAN 300 MG PO TABS
300.0000 mg | ORAL_TABLET | Freq: Every day | ORAL | Status: DC
  Administered 2024-03-31 – 2024-04-01 (×2): 300 mg via ORAL
  Filled 2024-03-30 (×3): qty 1

## 2024-03-30 MED ORDER — IPRATROPIUM-ALBUTEROL 0.5-2.5 (3) MG/3ML IN SOLN
3.0000 mL | Freq: Four times a day (QID) | RESPIRATORY_TRACT | Status: DC
  Administered 2024-03-31 (×4): 3 mL via RESPIRATORY_TRACT
  Filled 2024-03-30 (×3): qty 3

## 2024-03-30 MED ORDER — METHYLPREDNISOLONE SODIUM SUCC 40 MG IJ SOLR
40.0000 mg | Freq: Every day | INTRAMUSCULAR | Status: DC
  Administered 2024-03-31 – 2024-04-01 (×2): 40 mg via INTRAVENOUS
  Filled 2024-03-30 (×2): qty 1

## 2024-03-30 MED ORDER — IPRATROPIUM-ALBUTEROL 0.5-2.5 (3) MG/3ML IN SOLN
3.0000 mL | Freq: Once | RESPIRATORY_TRACT | Status: AC
  Administered 2024-03-30: 3 mL via RESPIRATORY_TRACT
  Filled 2024-03-30: qty 3

## 2024-03-30 MED ORDER — GUAIFENESIN-DM 100-10 MG/5ML PO SYRP: 5.0000 mL | ORAL_SOLUTION | ORAL | Status: DC | PRN

## 2024-03-30 MED ORDER — ENOXAPARIN SODIUM 40 MG/0.4ML IJ SOSY
40.0000 mg | PREFILLED_SYRINGE | INTRAMUSCULAR | Status: DC
  Administered 2024-03-31 – 2024-04-01 (×2): 40 mg via SUBCUTANEOUS
  Filled 2024-03-30 (×2): qty 0.4

## 2024-03-30 MED ORDER — ACETAMINOPHEN 500 MG PO TABS
500.0000 mg | ORAL_TABLET | Freq: Four times a day (QID) | ORAL | Status: DC | PRN
  Administered 2024-03-31 (×3): 500 mg via ORAL
  Filled 2024-03-30 (×3): qty 1

## 2024-03-30 MED ORDER — HYDRALAZINE HCL 50 MG PO TABS
100.0000 mg | ORAL_TABLET | Freq: Three times a day (TID) | ORAL | Status: DC
  Administered 2024-03-31 – 2024-04-01 (×5): 100 mg via ORAL
  Filled 2024-03-30 (×5): qty 2

## 2024-03-30 MED ORDER — SODIUM CHLORIDE 0.9 % IV SOLN
500.0000 mg | INTRAVENOUS | Status: DC
  Administered 2024-03-31 (×2): 500 mg via INTRAVENOUS
  Filled 2024-03-30 (×2): qty 5

## 2024-03-30 NOTE — ED Provider Notes (Signed)
 [place] EMERGENCY DEPARTMENT AT [facility] Provider Note   CSN: [id number] Arrival date & time: 03/30/24  8069     Patient presents with: Shortness of Breath   Wendy Torres is a 83 y.o. female.   Is a 83 year old female presenting emergency department for shortness of breath.  On baseline 4 L nasal cannula.  Reports increasing shortness of breath and cough x 2 days.  Worsening shortness of breath this evening and oxygen  levels noted to be low.  EMS noted hypoxia with oxygen  levels in the 70s on her 4 L.  Was placed on simple facemask 8 L.  Patient denies lower extremity edema, palpitations, lightheadedness.  Is feeling somewhat improved in terms of her shortness of breath after increasing oxygen .   Shortness of Breath      Prior to Admission medications   Medication Sig Start Date End Date Taking? Authorizing Provider  acetaminophen  (TYLENOL ) 325 MG tablet Take 2 tablets (650 mg total) by mouth every 6 (six) hours as needed for mild pain (or Fever >/= 101). 02/19/14   Perkins, Alexzandrew L, PA-C  albuterol  (VENTOLIN  HFA) 108 (90 Base) MCG/ACT inhaler Inhale 2 puffs into the lungs 3 (three) times daily for 3 days, THEN 2 puffs every 4 (four) hours as needed for wheezing or shortness of breath. 03/07/24 03/10/25  Sebastian Toribio GAILS, MD  ALPRAZolam  (XANAX ) 0.25 MG tablet Take 1 tablet (0.25 mg total) by mouth 2 (two) times daily as needed. Patient taking differently: Take 0.25 mg by mouth 2 (two) times daily as needed for anxiety. 11/05/23   [name], MD  amLODipine -valsartan  (EXFORGE ) 10-320 MG tablet Take 1 tablet by mouth daily. 12/13/23   Vannie Reche RAMAN, NP  aspirin  EC 81 MG tablet Take 1 tablet (81 mg total) by mouth daily. Swallow whole. 01/22/23   Tobie Yetta HERO, MD  cloNIDine  (CATAPRES ) 0.2 MG tablet TAKE 1 TABLET BY MOUTH TWICE  DAILY Patient taking differently: Take 0.2 mg by mouth in the morning and at bedtime. 01/28/24   [name], MD  fluticasone   (FLONASE ) 50 MCG/ACT nasal spray Place 2 sprays into both nostrils daily for 7 days. 03/08/24 03/15/24  Sebastian Toribio GAILS, MD  Fluticasone -Umeclidin-Vilant (TRELEGY ELLIPTA ) 200-62.5-25 MCG/ACT AEPB Inhale 1 Inhalation into the lungs daily. 03/07/24   Mannam, Praveen, MD  furosemide  (LASIX ) 40 MG tablet Take 1 tablet (40 mg total) by mouth daily. 01/21/24   Tobb, Kardie, DO  gabapentin  (NEURONTIN ) 600 MG tablet TAKE 1 TABLET BY MOUTH 3 TIMES  DAILY 01/28/24   [name], MD  guaiFENesin  (MUCINEX ) 600 MG 12 hr tablet Take 1 tablet (600 mg total) by mouth 2 (two) times daily. Patient taking differently: Take 600 mg by mouth 2 (two) times daily as needed for to loosen phlegm or cough. 01/19/23   Tobie Yetta HERO, MD  hydrALAZINE  (APRESOLINE ) 100 MG tablet TAKE 1 TABLET BY MOUTH 3 TIMES  DAILY 10/29/23   [name], MD  ipratropium-albuterol  (DUONEB) 0.5-2.5 (3) MG/3ML SOLN Take 3 mLs by nebulization every 4 (four) hours as needed (wheezing or shortness of breath). 03/24/24   Mannam, Praveen, MD  iron  polysaccharides (NIFEREX) 150 MG capsule TAKE 1 CAPSULE BY MOUTH EVERY DAY Patient taking differently: Take 150 mg by mouth daily with breakfast. 11/12/23   [name], MD  loratadine  (CLARITIN ) 10 MG tablet Take 1 tablet (10 mg total) by mouth daily. 03/08/24   Sebastian Toribio GAILS, MD  lovastatin  (MEVACOR ) 40 MG  tablet TAKE 1 TABLET BY MOUTH AT  BEDTIME 11/26/23   [name], MD  meclizine  (ANTIVERT ) 12.5 MG tablet Take 1 tablet (12.5 mg total) by mouth 3 (three) times daily as needed. 03/20/24 03/20/25  [name], MD  nystatin  (MYCOSTATIN ) 100000 UNIT/ML suspension Swish and spit 5mL two times daily after using Symbicort . Patient taking differently: 5 mLs See admin instructions. Swish and spit 5 ml's once a day after using Trelegy Ellipta  in the morning 11/09/22   [name], MD  OXYGEN  Inhale 4 L/min into the lungs continuous.    [provider]  pantoprazole  (PROTONIX ) 40 MG tablet TAKE  1 TABLET BY MOUTH DAILY Patient taking differently: Take 40 mg by mouth daily before breakfast. 11/15/23   [name], MD  polyethylene glycol (MIRALAX  / GLYCOLAX ) 17 g packet Take 17 g by mouth daily as needed for mild constipation or moderate constipation.    [provider]  potassium chloride  SA (KLOR-CON  M20) 20 MEQ tablet Take 1 tablet (20 mEq total) by mouth daily. 01/21/24   Tobb, Kardie, DO  solifenacin  (VESICARE ) 10 MG tablet TAKE 1 TABLET BY MOUTH EVERY DAY 10/29/23   [name], MD  tiZANidine  (ZANAFLEX ) 4 MG tablet Take 1 tablet (4 mg total) by mouth every 6 (six) hours as needed for muscle spasms. 03/20/24   [name], MD  traMADol  (ULTRAM ) 50 MG tablet Take 1 tablet (50 mg total) by mouth every 6 (six) hours as needed. for pain 03/20/24   [name], MD  traZODone  (DESYREL ) 50 MG tablet TAKE 1/2 TO 1 TABLET BY MOUTH AT BEDTIME AS NEEDED FOR SLEEP Patient taking differently: Take 50 mg by mouth at bedtime. 11/05/23   [name], MD  triamcinolone  cream (KENALOG ) 0.1 % Apply topically 2 (two) times daily. Patient taking differently: Apply 1 Application topically 2 (two) times daily as needed (for itching). 11/05/23   [name], MD    Allergies: Benazepril  and Chlorthalidone    Review of Systems  Respiratory:  Positive for shortness of breath.     Updated Vital Signs BP (!) 151/60   Pulse 91   Temp 97.6 F (36.4 C) (Axillary)   Resp 19   Ht 5' 4 (1.626 m)   Wt 88 kg   SpO2 94%   BMI 33.30 kg/m   Physical Exam Vitals and nursing note reviewed.  Constitutional:      General: She is not in acute distress.    Appearance: She is obese. She is not toxic-appearing.  HENT:     Head: Normocephalic.  Cardiovascular:     Rate and Rhythm: Normal rate and regular rhythm.  Pulmonary:     Effort: Tachypnea present. No respiratory distress.     Breath sounds: Wheezing present.  Chest:     Chest wall: No edema.  Musculoskeletal:     Cervical back:  Normal range of motion.     Right lower leg: No edema.     Left lower leg: No edema.  Skin:    General: Skin is warm and dry.     Capillary Refill: Capillary refill takes less than 2 seconds.  Neurological:     General: No focal deficit present.     Mental Status: She is alert.  Psychiatric:        Mood and Affect: Mood normal.        Behavior: Behavior normal.     (all labs ordered are listed, but  only abnormal results are displayed) Labs Reviewed  CBC - Abnormal; Notable for the following components:      Result Value   Hemoglobin 10.2 (*)    HCT 33.0 (*)    All other components within normal limits  BASIC METABOLIC PANEL WITH GFR - Abnormal; Notable for the following components:   Glucose, Bld 126 (*)    Creatinine, Ser 1.14 (*)    Calcium 10.4 (*)    GFR, Estimated 47 (*)    All other components within normal limits  TROPONIN T, HIGH SENSITIVITY - Abnormal; Notable for the following components:   Troponin T High Sensitivity 38 (*)    All other components within normal limits  RESP PANEL BY RT-PCR (RSV, FLU A&B, COVID)  [id number]  PRO BRAIN NATRIURETIC PEPTIDE  TROPONIN T, HIGH SENSITIVITY    EKG: EKG Interpretation Date/Time:  Sunday March 30 2024 19:52:18 EST Ventricular Rate:  86 PR Interval:  158 QRS Duration:  90 QT Interval:  376 QTC Calculation: 450 R Axis:   -8  Text Interpretation: Sinus rhythm Atrial premature complex Confirmed by Neysa Clap [phone]) on 03/30/2024 10:05:00 PM  Radiology: DG Chest Portable 1 View Result Date: 03/30/2024 EXAM: 1 VIEW(S) XRAY OF THE CHEST 03/30/2024 08:00:00 PM COMPARISON: 03/06/2024 CLINICAL HISTORY: COPD vs CHF FINDINGS: LUNGS AND PLEURA: Chronic hyperinflation. Increased basilar airspace and interstitial opacities compared to 03/06/24. No pleural effusion. No pneumothorax. HEART AND MEDIASTINUM: Chronic cardiomegaly. Unchanged mediastinal contours with aortic atherosclerosis and tortuosity. BONES AND SOFT TISSUES:  Remote right rib fractures. IMPRESSION: 1. Increased basilar airspace and interstitial opacities compared to 03/06/24. Progressive atelectasis versus pneumonia or aspiration. Electronically signed by: Norman Gatlin MD 03/30/2024 08:12 PM EST RP Workstation: [id number]     Procedures   Medications Ordered in the ED  ipratropium-albuterol  (DUONEB) 0.5-2.5 (3) MG/3ML nebulizer solution 3 mL (3 mLs Nebulization Given 03/30/24 1954)  methylPREDNISolone  sodium succinate (SOLU-MEDROL ) 125 mg/2 mL injection 125 mg (125 mg Intravenous Given 03/30/24 2049)  ipratropium-albuterol  (DUONEB) 0.5-2.5 (3) MG/3ML nebulizer solution 3 mL (3 mLs Nebulization Given 03/30/24 2048)    Clinical Course as of 03/30/24 2228  Sun Mar 30, 2024  2020 DG Chest Portable 1 View IMPRESSION: 1. Increased basilar airspace and interstitial opacities compared to 03/06/24. Progressive atelectasis versus pneumonia or aspiration.   [TY]  2204 Troponin T High Sensitivity(!): 38 No chest pain  [TY]  2228 Spoke with Dr. Shona for admission.  Agrees to see the patient. [TY]    Clinical Course User Index [TY] Neysa Clap PARAS, DO                                 Medical Decision Making This is a 83 year old female history of COPD and CHF on 4 L of oxygen  at baseline presented with shortness of breath.  EMS reported hypoxia down to 75% on home oxygen .  They placed her on nonrebreather and was able to down titrate to simple facemask.  On my exam did not appear to be in overt respiratory distress, some mild tachypnea and speaking in shorter sentences.  Poor air movement with some wheezing.  Does not appear overtly fluid overloaded on exam.  BNP not significantly elevated.  Chest x-ray read as increasing opacifications possible pneumonia, she has no fever or tachycardia or leukocytosis to suggest infectious process.  No significant metabolic derangements.  Creatinine appears to be at baseline.  Minor elevation in her troponin, but  no  ischemic changes on her EKG as independently reviewed by me.  Flu/COVID/RSV negative.  Patient able to be placed back on her 4 L nasal cannula after steroids and 2 DuoNebs, but saturating at 90%.  Desats with any type of exertion.  Will admit for COPD exacerbation.  Amount and/or Complexity of Data Reviewed Independent Historian:     Details: Feeling member notes 4 L nasal cannula at baseline External Data Reviewed:     Details: Recent hospitalization beginning of this month similar to clinical picture. Labs: ordered. Decision-making details documented in ED Course. Radiology: ordered and independent interpretation performed. Decision-making details documented in ED Course.    Details: No pneumothorax on my independent review ECG/medicine tests: ordered.  Risk Prescription drug management. Decision regarding hospitalization. Diagnosis or treatment significantly limited by social determinants of health. Risk Details: Poor health literacy       Final diagnoses:  COPD exacerbation [facility])    ED Discharge Orders     None          Neysa Caron PARAS, DO 03/30/24 2210

## 2024-03-30 NOTE — H&P (Incomplete)
 History and Physical  Wendy Torres FMW:[id number] DOB: [birth year] DOA: 03/30/2024  Referring physician: Dr. Neysa, EDP  PCP: Wendy Lynwood ORN, MD  Outpatient Specialists: Pulmonary, hematology/oncology. Patient coming from: Home.  Chief Complaint: Shortness of breath.  HPI: Wendy Torres is a 83 y.o. female with medical history significant for COPD, chronic hypoxia on 4 L nasal cannula at baseline, chronic HFpEF, hypertension, hyperlipidemia, GERD, who presents to the ER with complaints of shortness of breath, worsening for the past 2 days.  Associated with a cough.  Her shortness of breath became worse this evening.  EMS was activated.  Upon EMS arrival the patient was saturating 75% on 4 L and was placed on nonrebreather.  Her respiratory status improved and she was downgraded to a simple facemask.  In the ER, wheezing on lungs auscultation.  She was she received DuoNebs, Solu-Medrol .  No clear evidence of active pneumonia on chest x-ray.  EDP requested admission for management of acute exacerbation of COPD.  Admitted by [facility], hospitalist service.  ED Course: ***  Review of Systems: Review of systems as noted in the HPI. All other systems reviewed and are negative.   Past Medical History:  Diagnosis Date   ALLERGIC RHINITIS 12/09/2006   ANEMIA 01/14/2009   Anxiety    Arthritis    ASTHMA 12/09/2006   Cataract    bilateral -removed   CHF (congestive heart failure) (HCC)    Chronic pain syndrome 02/16/2009   COPD 12/09/2006   DEGENERATIVE JOINT DISEASE 12/09/2006   DIZZINESS 03/30/2010   DYSPNEA 01/27/2010   with heavy exertion   FREQUENCY, URINARY 03/30/2010   GERD 12/09/2006   HELICOBACTER PYLORI GASTRITIS, HX OF 12/09/2006   HIATAL HERNIA 03/01/2010   HYPERLIPIDEMIA 04/18/2007   HYPERSOMNIA 08/31/2008   HYPERTENSION 12/09/2006   INSOMNIA-SLEEP DISORDER-UNSPEC 11/05/2008   LOW BACK PAIN 12/09/2006   Morbid obesity (HCC) 12/09/2006   Nonspecific (abnormal) findings on  radiological and other examination of body structure 11/05/2008   OSTEOPOROSIS 12/09/2006   OVERACTIVE BLADDER 04/14/2008   PALPITATIONS, RECURRENT 01/27/2010   Stroke (HCC)    pt. stated light one years ago   SYNCOPE 11/05/2008   TRANSIENT ISCHEMIC ATTACK, HX OF 12/09/2006   Past Surgical History:  Procedure Laterality Date   BIOPSY  01/18/2023   Procedure: BIOPSY;  Surgeon: Aneita Gwendlyn DASEN, MD;  Location: [facility];  Service: Gastroenterology;;   COLONOSCOPY     ESOPHAGOGASTRODUODENOSCOPY (EGD) WITH PROPOFOL  N/A 01/18/2023   Procedure: ESOPHAGOGASTRODUODENOSCOPY (EGD) WITH PROPOFOL ;  Surgeon: Aneita Gwendlyn DASEN, MD;  Location: [facility];  Service: Gastroenterology;  Laterality: N/A;   HOT HEMOSTASIS N/A 01/18/2023   Procedure: HOT HEMOSTASIS (ARGON PLASMA COAGULATION/BICAP);  Surgeon: Aneita Gwendlyn DASEN, MD;  Location: [facility];  Service: Gastroenterology;  Laterality: N/A;   LUMBAR DISC SURGERY     TONSILLECTOMY     as a child   TOTAL HIP ARTHROPLASTY Right 11/06/2012   Procedure: RIGHT TOTAL HIP ARTHROPLASTY WITH ACETABULAR AUTOGRAFT;  Surgeon: Dempsey LULLA Moan, MD;  Location: [facility];  Service: Orthopedics;  Laterality: Right;   TOTAL HIP ARTHROPLASTY Left 02/18/2014   Procedure: LEFT TOTAL HIP ARTHROPLASTY;  Surgeon: Dempsey Moan LULLA, MD;  Location: [facility];  Service: Orthopedics;  Laterality: Left;   TUBAL LIGATION      Social History:  reports that she quit smoking about 51 years ago. Her smoking use included cigarettes. She started smoking about 76 years ago. She has a 12.5 pack-year smoking history. She has never used smokeless  tobacco. She reports that she does not drink alcohol  and does not use drugs.   Allergies  Allergen Reactions   Benazepril  Shortness Of Breath, Swelling and Other (See Comments)    Face became swollen, plus eyes and cheeks (too)   Chlorthalidone Shortness Of Breath    Family History  Problem Relation Age of Onset   Heart disease Mother         Had pacemaker   Lung cancer Father    Hypertension Brother    Diabetes Neg Hx    Colon cancer Neg Hx    Colon polyps Neg Hx    Kidney disease Neg Hx    Gallbladder disease Neg Hx    Esophageal cancer Neg Hx    Allergic rhinitis Neg Hx    Angioedema Neg Hx    Asthma Neg Hx    Atopy Neg Hx    Eczema Neg Hx    Immunodeficiency Neg Hx    Urticaria Neg Hx     ***  Prior to Admission medications   Medication Sig Start Date End Date Taking? Authorizing Provider  acetaminophen  (TYLENOL ) 325 MG tablet Take 2 tablets (650 mg total) by mouth every 6 (six) hours as needed for mild pain (or Fever >/= 101). 02/19/14   Perkins, Alexzandrew L, PA-C  albuterol  (VENTOLIN  HFA) 108 (90 Base) MCG/ACT inhaler Inhale 2 puffs into the lungs 3 (three) times daily for 3 days, THEN 2 puffs every 4 (four) hours as needed for wheezing or shortness of breath. 03/07/24 03/10/25  Sebastian Toribio GAILS, MD  ALPRAZolam  (XANAX ) 0.25 MG tablet Take 1 tablet (0.25 mg total) by mouth 2 (two) times daily as needed. Patient taking differently: Take 0.25 mg by mouth 2 (two) times daily as needed for anxiety. 11/05/23   Wendy Lynwood ORN, MD  amLODipine -valsartan  (EXFORGE ) 10-320 MG tablet Take 1 tablet by mouth daily. 12/13/23   Vannie Reche RAMAN, NP  aspirin  EC 81 MG tablet Take 1 tablet (81 mg total) by mouth daily. Swallow whole. 01/22/23   Tobie Yetta HERO, MD  cloNIDine  (CATAPRES ) 0.2 MG tablet TAKE 1 TABLET BY MOUTH TWICE  DAILY Patient taking differently: Take 0.2 mg by mouth in the morning and at bedtime. 01/28/24   Wendy Lynwood ORN, MD  fluticasone  (FLONASE ) 50 MCG/ACT nasal spray Place 2 sprays into both nostrils daily for 7 days. 03/08/24 03/15/24  Sebastian Toribio GAILS, MD  Fluticasone -Umeclidin-Vilant (TRELEGY ELLIPTA ) 200-62.5-25 MCG/ACT AEPB Inhale 1 Inhalation into the lungs daily. 03/07/24   Mannam, Praveen, MD  furosemide  (LASIX ) 40 MG tablet Take 1 tablet (40 mg total) by mouth daily. 01/21/24   Tobb, Kardie, DO  gabapentin   (NEURONTIN ) 600 MG tablet TAKE 1 TABLET BY MOUTH 3 TIMES  DAILY 01/28/24   Wendy Lynwood ORN, MD  guaiFENesin  (MUCINEX ) 600 MG 12 hr tablet Take 1 tablet (600 mg total) by mouth 2 (two) times daily. Patient taking differently: Take 600 mg by mouth 2 (two) times daily as needed for to loosen phlegm or cough. 01/19/23   Tobie Yetta HERO, MD  hydrALAZINE  (APRESOLINE ) 100 MG tablet TAKE 1 TABLET BY MOUTH 3 TIMES  DAILY 10/29/23   Wendy Lynwood ORN, MD  ipratropium-albuterol  (DUONEB) 0.5-2.5 (3) MG/3ML SOLN Take 3 mLs by nebulization every 4 (four) hours as needed (wheezing or shortness of breath). 03/24/24   Mannam, Praveen, MD  iron  polysaccharides (NIFEREX) 150 MG capsule TAKE 1 CAPSULE BY MOUTH EVERY DAY Patient taking differently: Take 150 mg by mouth daily with breakfast. 11/12/23  Wendy Lynwood ORN, MD  loratadine  (CLARITIN ) 10 MG tablet Take 1 tablet (10 mg total) by mouth daily. 03/08/24   Sebastian Toribio GAILS, MD  lovastatin  (MEVACOR ) 40 MG tablet TAKE 1 TABLET BY MOUTH AT  BEDTIME 11/26/23   Wendy Lynwood ORN, MD  meclizine  (ANTIVERT ) 12.5 MG tablet Take 1 tablet (12.5 mg total) by mouth 3 (three) times daily as needed. 03/20/24 03/20/25  Wendy Lynwood ORN, MD  nystatin  (MYCOSTATIN ) 100000 UNIT/ML suspension Swish and spit 5mL two times daily after using Symbicort . Patient taking differently: 5 mLs See admin instructions. Swish and spit 5 ml's once a day after using Trelegy Ellipta  in the morning 11/09/22   Wendy Lynwood ORN, MD  OXYGEN  Inhale 4 L/min into the lungs continuous.    [provider]  pantoprazole  (PROTONIX ) 40 MG tablet TAKE 1 TABLET BY MOUTH DAILY Patient taking differently: Take 40 mg by mouth daily before breakfast. 11/15/23   Wendy Lynwood ORN, MD  polyethylene glycol (MIRALAX  / GLYCOLAX ) 17 g packet Take 17 g by mouth daily as needed for mild constipation or moderate constipation.    [provider]  potassium chloride  SA (KLOR-CON  M20) 20 MEQ tablet Take 1 tablet (20 mEq total) by mouth daily.  01/21/24   Tobb, Kardie, DO  solifenacin  (VESICARE ) 10 MG tablet TAKE 1 TABLET BY MOUTH EVERY DAY 10/29/23   Wendy Lynwood ORN, MD  tiZANidine  (ZANAFLEX ) 4 MG tablet Take 1 tablet (4 mg total) by mouth every 6 (six) hours as needed for muscle spasms. 03/20/24   Wendy Lynwood ORN, MD  traMADol  (ULTRAM ) 50 MG tablet Take 1 tablet (50 mg total) by mouth every 6 (six) hours as needed. for pain 03/20/24   Wendy Lynwood ORN, MD  traZODone  (DESYREL ) 50 MG tablet TAKE 1/2 TO 1 TABLET BY MOUTH AT BEDTIME AS NEEDED FOR SLEEP Patient taking differently: Take 50 mg by mouth at bedtime. 11/05/23   Wendy Lynwood ORN, MD  triamcinolone  cream (KENALOG ) 0.1 % Apply topically 2 (two) times daily. Patient taking differently: Apply 1 Application topically 2 (two) times daily as needed (for itching). 11/05/23   Wendy Lynwood ORN, MD    Physical Exam: BP (!) 151/60   Pulse 91   Temp 97.6 F (36.4 C) (Axillary)   Resp 19   Ht 5' 4 (1.626 m)   Wt 88 kg   SpO2 94%   BMI 33.30 kg/m   General: 83 y.o. year-old female well developed well nourished in no acute distress.  Alert and oriented x3. Cardiovascular: Regular rate and rhythm with no rubs or gallops.  No thyromegaly or JVD noted.  No lower extremity edema. 2/4 pulses in all 4 extremities. Respiratory: Clear to auscultation with no wheezes or rales. Good inspiratory effort. Abdomen: Soft nontender nondistended with normal bowel sounds x4 quadrants. Muskuloskeletal: No cyanosis, clubbing or edema noted bilaterally Neuro: CN II-XII intact, strength, sensation, reflexes Skin: No ulcerative lesions noted or rashes Psychiatry: Judgement and insight appear normal. Mood is appropriate for condition and setting          Labs on Admission:  Basic Metabolic Panel: Recent Labs  Lab 03/30/24 2006  NA 137  K 4.0  CL 98  CO2 30  GLUCOSE 126*  BUN 15  CREATININE 1.14*  CALCIUM 10.4*   Liver Function Tests: No results for input(s): AST, ALT, ALKPHOS, BILITOT, PROT,  ALBUMIN in the last 168 hours. No results for input(s): LIPASE, AMYLASE in the last 168 hours. No results for input(s): AMMONIA  in the last 168 hours. CBC: Recent Labs  Lab 03/30/24 2006  WBC 5.8  HGB 10.2*  HCT 33.0*  MCV 84.6  PLT 292   Cardiac Enzymes: No results for input(s): CKTOTAL, CKMB, CKMBINDEX, TROPONINI in the last 168 hours.  BNP (last 3 results) Recent Labs    09/13/23 2311 11/25/23 1050  BNP 23.9 27.3    ProBNP (last 3 results) Recent Labs    03/03/24 1204 03/30/24 2006  PROBNP 148.0 174.0    CBG: No results for input(s): GLUCAP in the last 168 hours.  Radiological Exams on Admission: DG Chest Portable 1 View Result Date: 03/30/2024 EXAM: 1 VIEW(S) XRAY OF THE CHEST 03/30/2024 08:00:00 PM COMPARISON: 03/06/2024 CLINICAL HISTORY: COPD vs CHF FINDINGS: LUNGS AND PLEURA: Chronic hyperinflation. Increased basilar airspace and interstitial opacities compared to 03/06/24. No pleural effusion. No pneumothorax. HEART AND MEDIASTINUM: Chronic cardiomegaly. Unchanged mediastinal contours with aortic atherosclerosis and tortuosity. BONES AND SOFT TISSUES: Remote right rib fractures. IMPRESSION: 1. Increased basilar airspace and interstitial opacities compared to 03/06/24. Progressive atelectasis versus pneumonia or aspiration. Electronically signed by: Norman Gatlin MD 03/30/2024 08:12 PM EST RP Workstation: [id number]    EKG: I independently viewed the EKG done and my findings are as followed: ***   Assessment/Plan Present on Admission:  Acute exacerbation of chronic obstructive pulmonary disease (COPD) (HCC)  Principal Problem:   Acute exacerbation of chronic obstructive pulmonary disease (COPD) (HCC)   DVT prophylaxis: ***   Code Status: ***   Family Communication: ***   Disposition Plan: ***   Consults called: ***   Admission status: ***    Status is: Inpatient {Inpatient:23812}   Terry LOISE Hurst MD [facility] Pager  [phone]  If 7PM-7AM, please contact night-coverage [email or web address] Password [facility]  03/30/2024, 10:39 PM

## 2024-03-30 NOTE — ED Triage Notes (Signed)
 Pt BIB [facility] from home for SOB for a few days. Pt then noticed low SP02 saturation. Hx COPD, CHF. 75 on RA with EMS. Currently on 8 L.

## 2024-03-31 DIAGNOSIS — J441 Chronic obstructive pulmonary disease with (acute) exacerbation: Secondary | ICD-10-CM | POA: Diagnosis not present

## 2024-03-31 DIAGNOSIS — I5033 Acute on chronic diastolic (congestive) heart failure: Secondary | ICD-10-CM | POA: Diagnosis not present

## 2024-03-31 DIAGNOSIS — J9621 Acute and chronic respiratory failure with hypoxia: Secondary | ICD-10-CM | POA: Diagnosis not present

## 2024-03-31 DIAGNOSIS — J189 Pneumonia, unspecified organism: Secondary | ICD-10-CM

## 2024-03-31 LAB — CBC WITH DIFFERENTIAL/PLATELET
Abs Immature Granulocytes: 0.01 K/uL (ref 0.00–0.07)
Basophils Absolute: 0 K/uL (ref 0.0–0.1)
Basophils Relative: 1 %
Eosinophils Absolute: 0 K/uL (ref 0.0–0.5)
Eosinophils Relative: 0 %
HCT: 32.7 % — ABNORMAL LOW (ref 36.0–46.0)
Hemoglobin: 10.3 g/dL — ABNORMAL LOW (ref 12.0–15.0)
Immature Granulocytes: 0 %
Lymphocytes Relative: 8 %
Lymphs Abs: 0.4 K/uL — ABNORMAL LOW (ref 0.7–4.0)
MCH: 26.5 pg (ref 26.0–34.0)
MCHC: 31.5 g/dL (ref 30.0–36.0)
MCV: 84.3 fL (ref 80.0–100.0)
Monocytes Absolute: 0.1 K/uL (ref 0.1–1.0)
Monocytes Relative: 1 %
Neutro Abs: 4.5 K/uL (ref 1.7–7.7)
Neutrophils Relative %: 90 %
Platelets: 309 K/uL (ref 150–400)
RBC: 3.88 MIL/uL (ref 3.87–5.11)
RDW: 15.1 % (ref 11.5–15.5)
WBC: 5 K/uL (ref 4.0–10.5)
nRBC: 0 % (ref 0.0–0.2)

## 2024-03-31 LAB — EXPECTORATED SPUTUM ASSESSMENT W GRAM STAIN, RFLX TO RESP C

## 2024-03-31 LAB — RESPIRATORY PANEL BY PCR

## 2024-03-31 LAB — BASIC METABOLIC PANEL WITH GFR
Anion gap: 10 (ref 5–15)
BUN: 14 mg/dL (ref 8–23)
CO2: 27 mmol/L (ref 22–32)
Calcium: 10.1 mg/dL (ref 8.9–10.3)
Chloride: 100 mmol/L (ref 98–111)
Creatinine, Ser: 0.95 mg/dL (ref 0.44–1.00)
GFR, Estimated: 58 mL/min — ABNORMAL LOW (ref >60)
Glucose, Bld: 159 mg/dL — ABNORMAL HIGH (ref 70–99)
Potassium: 3.7 mmol/L (ref 3.5–5.1)
Sodium: 137 mmol/L (ref 135–145)

## 2024-03-31 LAB — MAGNESIUM: Magnesium: 1.9 mg/dL (ref 1.7–2.4)

## 2024-03-31 LAB — PROCALCITONIN: Procalcitonin: <0.1 ng/mL

## 2024-03-31 LAB — PHOSPHORUS: Phosphorus: 2.1 mg/dL — ABNORMAL LOW (ref 2.5–4.6)

## 2024-03-31 MED ORDER — ALPRAZOLAM 0.25 MG PO TABS
0.2500 mg | ORAL_TABLET | Freq: Two times a day (BID) | ORAL | Status: DC | PRN
  Administered 2024-03-31 – 2024-04-01 (×3): 0.25 mg via ORAL
  Filled 2024-03-31 (×3): qty 1

## 2024-03-31 MED ORDER — FUROSEMIDE 10 MG/ML IJ SOLN
40.0000 mg | Freq: Two times a day (BID) | INTRAMUSCULAR | Status: DC
  Administered 2024-03-31 – 2024-04-01 (×3): 40 mg via INTRAVENOUS
  Filled 2024-03-31 (×3): qty 4

## 2024-03-31 MED ORDER — BUDESON-GLYCOPYRROL-FORMOTEROL 160-9-4.8 MCG/ACT IN AERO
2.0000 | INHALATION_SPRAY | Freq: Two times a day (BID) | RESPIRATORY_TRACT | Status: DC
  Administered 2024-03-31 – 2024-04-01 (×3): 2 via RESPIRATORY_TRACT
  Filled 2024-03-31: qty 5.9

## 2024-03-31 MED ORDER — TRAMADOL HCL 50 MG PO TABS
50.0000 mg | ORAL_TABLET | Freq: Four times a day (QID) | ORAL | Status: DC | PRN
  Administered 2024-03-31: 50 mg via ORAL
  Filled 2024-03-31: qty 1

## 2024-03-31 MED ORDER — MUSCLE RUB 10-15 % EX CREA
TOPICAL_CREAM | CUTANEOUS | Status: DC | PRN
  Filled 2024-03-31: qty 85

## 2024-03-31 MED ORDER — PRAVASTATIN SODIUM 40 MG PO TABS
40.0000 mg | ORAL_TABLET | Freq: Every day | ORAL | Status: DC
  Administered 2024-03-31: 40 mg via ORAL
  Filled 2024-03-31: qty 1

## 2024-03-31 MED ORDER — SODIUM CHLORIDE 0.9 % IV SOLN
2.0000 g | INTRAVENOUS | Status: DC
  Administered 2024-03-31 (×2): 2 g via INTRAVENOUS
  Filled 2024-03-31 (×2): qty 20

## 2024-03-31 MED ORDER — PANTOPRAZOLE SODIUM 40 MG PO TBEC
40.0000 mg | DELAYED_RELEASE_TABLET | Freq: Every day | ORAL | Status: DC
  Administered 2024-03-31 – 2024-04-01 (×2): 40 mg via ORAL
  Filled 2024-03-31 (×2): qty 1

## 2024-03-31 NOTE — Progress Notes (Signed)
 Progress Note   Patient: Wendy Torres FMW:[id number] DOB: 29-May-1940 DOA: 03/30/2024  DOS: the patient was seen and examined on 03/31/2024   Brief hospital course:  83 y.o. female with medical history significant for COPD, chronic hypoxia on 4 L nasal cannula at baseline, chronic HFpEF, hypertension, hyperlipidemia, GERD, CKD 3A, situational anxiety, on Xanax  as needed, who presents to the ER with complaints of shortness of breath, worsening for the past 2 days.   Assessment and Plan:  Acute on chronic hypoxic respiratory failure - Baseline 4 L nasal cannula, initially needing nonrebreather.  Was satting 75% on 4 L.  Appears to be showing improvement this morning.  Still somewhat dyspneic.  Continue to monitor closely.  Acute COPD exacerbation - Worsening hypoxia, dyspnea, and wheezing on presentation.  Initiated on IV Solu-Medrol , scheduled nebulizers, empiric antibiotics.  Appears to be showing mild improvement.  Continue to monitor O2 closely.  Possible community-acquired pneumonia - Imaging noting right lower lobe patchiness.  Rapid COVID/flu/RSV negative.  Initiated on empiric ceftriaxone  plus azithromycin .  Will order procalcitonin, respiratory panel.  Supportive care as above.  Chronic HFpEF - Echo earlier this month noting EF 70-75% with grade 1 DD.  Likely contributing to patient's hypoxia.  Will empirically initiate on IV Lasix  40 mg twice daily.  Monitor urine output response.  Hypertension - Restarted home antihypertensive regiment.  Monitor blood pressure closely.  CKD 3A - Creatinine appears to be at baseline.  Will recheck BMP in AM.  Physical debilitation muscle weakness - PT/OT eval ordered.   Subjective: Patient sitting up in the bed, states she feels improved but is still short of breath.  Denies any fever but does admit to some cough with whitish sputum.  No chest pain, nausea, vomiting, abdominal pain.  Physical Exam:  Vitals:   03/31/24 0500 03/31/24 0815  03/31/24 0816 03/31/24 0819  BP:    (!) 156/68  Pulse:    88  Resp:      Temp:      TempSrc:      SpO2:  94% 93% 94%  Weight: 88.2 kg     Height:        GENERAL:  Alert, pleasant, no acute distress, obese HEENT:  EOMI, nasal cannula CARDIOVASCULAR: RRR, no murmurs appreciated RESPIRATORY: Dry cough, dyspnea, very poor air movement bilaterally GASTROINTESTINAL:  Soft, nontender, nondistended EXTREMITIES:  No LE edema bilaterally NEURO:  No new focal deficits appreciated SKIN:  No rashes noted PSYCH:  Appropriate mood and affect, anxious    Data Reviewed:  Imaging Studies: DG Chest Portable 1 View Result Date: 03/30/2024 EXAM: 1 VIEW(S) XRAY OF THE CHEST 03/30/2024 08:00:00 PM COMPARISON: 03/06/2024 CLINICAL HISTORY: COPD vs CHF FINDINGS: LUNGS AND PLEURA: Chronic hyperinflation. Increased basilar airspace and interstitial opacities compared to 03/06/24. No pleural effusion. No pneumothorax. HEART AND MEDIASTINUM: Chronic cardiomegaly. Unchanged mediastinal contours with aortic atherosclerosis and tortuosity. BONES AND SOFT TISSUES: Remote right rib fractures. IMPRESSION: 1. Increased basilar airspace and interstitial opacities compared to 03/06/24. Progressive atelectasis versus pneumonia or aspiration. Electronically signed by: Norman Gatlin MD 03/30/2024 08:12 PM EST RP Workstation: [id number]   DG CHEST PORT 1 VIEW Result Date: 03/06/2024 EXAM: 1 VIEW(S) XRAY OF THE CHEST 03/06/2024 01:22:00 PM COMPARISON: 03/03/2024 CLINICAL HISTORY: Hypoxia 200808 FINDINGS: LUNGS AND PLEURA: Improved aeration of lung bases. Persistent left basilar atelectasis. No pulmonary edema. No pleural effusion. No pneumothorax. HEART AND MEDIASTINUM: Cardiomegaly, unchanged. Aortic arch calcifications. BONES AND SOFT TISSUES: Old right rib fractures. IMPRESSION: 1. Improved aeration  of the lung bases with persistent left basilar atelectasis. No pulmonary edema, pleural effusion, or pneumothorax. Electronically  signed by: Waddell Calk MD 03/06/2024 04:15 PM EST RP Workstation: [id number]   ECHOCARDIOGRAM COMPLETE Result Date: 03/04/2024    ECHOCARDIOGRAM REPORT   Patient Name:   Wendy Torres Date of Exam: 03/04/2024 Medical Rec #:  [id number]      Height:       64.0 in Accession #:    [id number]     Weight:       194.9 lb Date of Birth:  12-02-1940      BSA:          1.935 m Patient Age:    83 years       BP:           174/77 mmHg Patient Gender: F              HR:           81 bpm. Exam Location:  Inpatient Procedure: 2D Echo, Cardiac Doppler and Color Doppler (Both Spectral and Color            Flow Doppler were utilized during procedure). Indications:    Dyspnea R06.00  History:        Patient has prior history of Echocardiogram examinations, most                 recent 04/06/2023.  Sonographer:    Tinnie Gosling RDCS Referring Phys: [phone] DAVID MANUEL ORTIZ IMPRESSIONS  1. Left ventricular ejection fraction, by estimation, is 70 to 75%. The left ventricle has hyperdynamic function. The left ventricle has no regional wall motion abnormalities. There is moderate concentric left ventricular hypertrophy. Left ventricular diastolic parameters are consistent with Grade I diastolic dysfunction (impaired relaxation).  2. Right ventricular systolic function is normal. The right ventricular size is moderately enlarged. There is mildly elevated pulmonary artery systolic pressure.  3. Left atrial size was mildly dilated.  4. The mitral valve is normal in structure. No evidence of mitral valve regurgitation. No evidence of mitral stenosis.  5. The aortic valve was not well visualized. Aortic valve regurgitation is not visualized.  6. The inferior vena cava is dilated in size with <50% respiratory variability, suggesting right atrial pressure of 20 mmHg. Comparison(s): Prior images reviewed side by side. Right ventricle has further dilated; TR not as well assessed in this study. FINDINGS  Left Ventricle: Left ventricular  ejection fraction, by estimation, is 70 to 75%. The left ventricle has hyperdynamic function. The left ventricle has no regional wall motion abnormalities. The left ventricular internal cavity size was normal in size. There is moderate concentric left ventricular hypertrophy. Left ventricular diastolic parameters are consistent with Grade I diastolic dysfunction (impaired relaxation). Right Ventricle: The right ventricular size is moderately enlarged. No increase in right ventricular wall thickness. Right ventricular systolic function is normal. There is mildly elevated pulmonary artery systolic pressure. The tricuspid regurgitant velocity is 2.48 m/s, and with an assumed right atrial pressure of 20 mmHg, the estimated right ventricular systolic pressure is 44.6 mmHg. Left Atrium: Left atrial size was mildly dilated. Right Atrium: Right atrial size was normal in size. Pericardium: There is no evidence of pericardial effusion. Mitral Valve: The mitral valve is normal in structure. No evidence of mitral valve regurgitation. No evidence of mitral valve stenosis. Tricuspid Valve: The tricuspid valve is normal in structure. Tricuspid valve regurgitation is trivial. No evidence of tricuspid stenosis. Aortic Valve: The aortic valve was  not well visualized. Aortic valve regurgitation is not visualized. Pulmonic Valve: The pulmonic valve was not well visualized. Pulmonic valve regurgitation is not visualized. No evidence of pulmonic stenosis. Aorta: The aortic root and ascending aorta are structurally normal, with no evidence of dilitation. Venous: The inferior vena cava is dilated in size with less than 50% respiratory variability, suggesting right atrial pressure of 15 mmHg. IAS/Shunts: The atrial septum is grossly normal.  LEFT VENTRICLE PLAX 2D LVIDd:         4.70 cm   Diastology LVIDs:         3.80 cm   LV e' medial:    4.57 cm/s LV PW:         1.30 cm   LV E/e' medial:  22.8 LV IVS:        1.30 cm   LV e' lateral:    8.59 cm/s LVOT diam:     2.10 cm   LV E/e' lateral: 12.1 LV SV:         95 LV SV Index:   49 LVOT Area:     3.46 cm LV IVRT:       95 msec  RIGHT VENTRICLE             IVC RV S prime:     17.00 cm/s  IVC diam: 2.60 cm TAPSE (M-mode): 2.5 cm                             PULMONARY VEINS                             Diastolic Velocity: 37.20 cm/s                             S/D Velocity:       1.20                             Systolic Velocity:  43.20 cm/s LEFT ATRIUM             Index        RIGHT ATRIUM           Index LA Vol (A2C):   60.7 ml 31.37 ml/m  RA Area:     20.30 cm LA Vol (A4C):   71.9 ml 37.16 ml/m  RA Volume:   53.60 ml  27.70 ml/m LA Biplane Vol: 67.5 ml 34.88 ml/m  AORTIC VALVE LVOT Vmax:   130.00 cm/s LVOT Vmean:  85.800 cm/s LVOT VTI:    0.275 m  AORTA Ao Root diam: 2.80 cm Ao Asc diam:  3.30 cm MITRAL VALVE                TRICUSPID VALVE MV Area (PHT): 3.03 cm     TR Peak grad:   24.6 mmHg MV Decel Time: 250 msec     TR Vmax:        248.00 cm/s MV E velocity: 104.00 cm/s MV A velocity: 122.00 cm/s  SHUNTS MV E/A ratio:  0.85         Systemic VTI:  0.28 m                             Systemic Diam: 2.10 cm [employer]  MD Electronically signed by Stanly Leavens MD Signature Date/Time: 03/04/2024/10:52:29 AM    Final    DG Chest Portable 1 View Result Date: 03/03/2024 CLINICAL DATA:  Shortness of breath EXAM: PORTABLE CHEST 1 VIEW COMPARISON:  November 25, 2023 FINDINGS: There is cardiomegaly with increased basilar infiltrate most likely edema. No pleural effusion or pneumothorax. IMPRESSION: Cardiomegaly with increased basilar infiltrates most likely pulmonary edema Electronically Signed   By: Nancyann Burns M.D.   On: 03/03/2024 12:59    There are no new results to review at this time.  Previous records (including but not limited to H&P, progress notes, nursing notes, TOC management) were reviewed in assessment of this patient.  Labs: CBC: Recent Labs  Lab 03/30/24 2006  03/31/24 0422  WBC 5.8 5.0  NEUTROABS  --  4.5  HGB 10.2* 10.3*  HCT 33.0* 32.7*  MCV 84.6 84.3  PLT 292 309   Basic Metabolic Panel: Recent Labs  Lab 03/30/24 2006 03/31/24 0422  NA 137 137  K 4.0 3.7  CL 98 100  CO2 30 27  GLUCOSE 126* 159*  BUN 15 14  CREATININE 1.14* 0.95  CALCIUM 10.4* 10.1  MG  --  1.9  PHOS  --  2.1*   Liver Function Tests: No results for input(s): AST, ALT, ALKPHOS, BILITOT, PROT, ALBUMIN in the last 168 hours. CBG: No results for input(s): GLUCAP in the last 168 hours.  Scheduled Meds:  amLODipine   10 mg Oral Daily   budesonide -glycopyrrolate-formoterol   2 puff Inhalation BID   cloNIDine   0.2 mg Oral BID   enoxaparin  (LOVENOX ) injection  40 mg Subcutaneous Q24H   hydrALAZINE   100 mg Oral TID   ipratropium-albuterol   3 mL Nebulization Q6H   irbesartan   300 mg Oral Daily   methylPREDNISolone  (SOLU-MEDROL ) injection  40 mg Intravenous Daily   pantoprazole   40 mg Oral Daily   pravastatin   40 mg Oral q1800   Continuous Infusions:  azithromycin  Stopped (03/31/24 0131)   cefTRIAXone  (ROCEPHIN )  IV Stopped (03/31/24 0223)   PRN Meds:.acetaminophen , ALPRAZolam , guaiFENesin -dextromethorphan, melatonin, Muscle Rub, polyethylene glycol, prochlorperazine  Family Communication: None at bedside  Disposition: Status is: Inpatient Remains inpatient appropriate because: COPD exacerbation     Time spent: 40 minutes  Length of inpatient stay: 1 days  Author: Carliss LELON Canales, DO 03/31/2024 10:40 AM  For on call review [email or web address].

## 2024-03-31 NOTE — Progress Notes (Signed)
 Received patient from ED alert oriented x4 with vitals signs stable, on 4L  with spo2 90-91%. Skin intact. Daughter at bedside on admission. Blood pressure elevated, antihypertensive medications restarted as ordered. Patient noted to be short of breath, especially with exertion. Bed alarm activated for safety.

## 2024-03-31 NOTE — Hospital Course (Signed)
 83 y.o. female with medical history significant for COPD, chronic hypoxia on 4 L nasal cannula at baseline, chronic HFpEF, hypertension, hyperlipidemia, GERD, CKD 3A, situational anxiety, on Xanax  as needed, who presents to the ER with complaints of shortness of breath, worsening for the past 2 days.    Assessment and Plan:   Acute on chronic hypoxic respiratory failure - Baseline 4 L nasal cannula, initially needing nonrebreather.  Was satting 75% on 4 L.  Appears to be showing improvement this morning.  Still somewhat dyspneic.  Continue to monitor closely.   Acute COPD exacerbation - Worsening hypoxia, dyspnea, and wheezing on presentation.  Initiated on IV Solu-Medrol , scheduled nebulizers, empiric antibiotics.  Appears to be showing mild improvement.  Continue to monitor O2 closely.   Possible community-acquired pneumonia - Imaging noting right lower lobe patchiness.  Rapid COVID/flu/RSV negative.  Initiated on empiric ceftriaxone  plus azithromycin .  Will order procalcitonin, respiratory panel.  Supportive care as above.   Chronic HFpEF - Echo earlier this month noting EF 70-75% with grade 1 DD.  Likely contributing to patient's hypoxia.  Will empirically initiate on IV Lasix  40 mg twice daily.  Monitor urine output response.   Hypertension - Restarted home antihypertensive regiment.  Monitor blood pressure closely.   CKD 3A - Creatinine appears to be at baseline.  Will recheck BMP in AM.   Physical debilitation muscle weakness - PT/OT eval ordered.

## 2024-03-31 NOTE — Evaluation (Signed)
 Occupational Therapy Evaluation Patient Details Name: Wendy Torres MRN: [id number] DOB: [birth year] Today's Date: 03/31/2024   History of Present Illness   Pt is a 83 y.o. female admitted with acute exacerbation of COPD & acute on chronic hypoxic respiratory failure. PMH significant for COPD, chronic hypoxia on 4 L O2 at baseline, chronic HFpEF, CHF, HTN, hyperlipidemia, GERD, CKD 3A, situational anxiety, morbid obesity, osteoporosis, TIA, L THA     Clinical Impressions Pt admitted with above. Prior to admission, pt states she was performing transfers only to BSC/recliner with family assist, participating in [facility] OT/PT and only ambulating with 4WW with [facility] PT. Pt primarily performs BADLs bed level. Pt limited by decreased cardiopulmonary status, with SpO2 85% on 4L upon arrival. Difficult to obtain consistent accurate pleth - pt dyspneic on 4L with SpO2 85%, increased to 5L and required several minutes of rest to achieve 90%. Pt participates in rolling L<>R and able to boost up with MIN A for repositioning. Anticipate pt close to baseline for ADL performance - was able to transfer to/from [place] with nursing this AM though extremely fatigued/SOB after. Further mobility deferred due to poor activity tolerance, will assess functional transfers next session. RN notified of O2 on 5L end of session. Pt would benefit from skilled OT services to address noted impairments and functional limitations (see below for any additional details) in order to maximize safety and independence while minimizing falls risk and caregiver burden. Anticipate the need for follow up [facility] OT services upon acute hospital DC.      If plan is discharge home, recommend the following:   A lot of help with walking and/or transfers;A lot of help with bathing/dressing/bathroom;Assist for transportation     Functional Status Assessment   Patient has had a recent decline in their functional status and demonstrates the ability to make  significant improvements in function in a reasonable and predictable amount of time.     Equipment Recommendations   Hospital bed      Precautions/Restrictions   Precautions Precautions: Fall Recall of Precautions/Restrictions: Intact Precaution/Restrictions Comments: monitor O2 Restrictions Weight Bearing Restrictions Per Provider Order: No     Mobility Bed Mobility Overal bed mobility: Needs Assistance Bed Mobility: Rolling Rolling: Min assist, Used rails         General bed mobility comments: pt able to roll L<>R with MIN A, boosts self up in bed (able to transition to upright long sitting with use of bed features), MIN A. Pt fatigues quickly with SOB/DOE with minimal exertion.    Transfers Overall transfer level: Needs assistance                 General transfer comment: NT      Balance Overall balance assessment: Needs assistance                             High Level Balance Comments: NT           ADL either performed or assessed with clinical judgement   ADL Overall ADL's : Needs assistance/impaired Eating/Feeding: Modified independent;Bed level   Grooming: Sitting;Minimal assistance   Upper Body Bathing: Sitting;Maximal assistance   Lower Body Bathing: Maximal assistance;Bed level   Upper Body Dressing : Sitting;Maximal assistance   Lower Body Dressing: Bed level;Maximal assistance   Toilet Transfer: Moderate assistance;BSC/3in1;Stand-pivot Toilet Transfer Details (indicate cue type and reason): clinical judgement (pt transferred t/f [facility] with NSG staff earlier) Toileting- Clothing Manipulation and  Hygiene: Maximal assistance;Sit to/from stand       Functional mobility during ADLs: Maximal assistance;Moderate assistance General ADL Comments: bed level evaluation completed 2/2 dyspnea and low O2 sats. Pt with poor activity tolerance due to decreased cardiopulmonary status. anticipate pt close to baseline for ADLs  (requires significant assist from family at home)     Vision Baseline Vision/History: 1 Wears glasses Ability to See in Adequate Light: 0 Adequate Patient Visual Report: No change from baseline              Pertinent Vitals/Pain Pain Assessment Pain Assessment: 0-10 Pain Score: 8  Pain Location: LBP Pain Descriptors / Indicators: Aching Pain Intervention(s): Limited activity within patient's tolerance, Monitored during session, Repositioned     Extremity/Trunk Assessment Upper Extremity Assessment Upper Extremity Assessment: Right hand dominant;Generalized weakness   Lower Extremity Assessment Lower Extremity Assessment: Generalized weakness       Communication Communication Communication: No apparent difficulties   Cognition Arousal: Alert Behavior During Therapy: WFL for tasks assessed/performed Cognition: No apparent impairments                               Following commands: Intact       Cueing  General Comments   Cueing Techniques: Verbal cues  SpO2 85% on 4L upon arrival. Difficult to obtain consistent accurate pleth - pt dyspneic on 4L with SpO2 85%, increased to 5L and required several minutes of rest to achieve 90%. RN notified.           Home Living Family/patient expects to be discharged to:: Private residence Living Arrangements: Children Available Help at Discharge: Family;Available PRN/intermittently Type of Home: House Home Access: Ramped entrance     Home Layout: One level     Bathroom Shower/Tub: Producer, Television/film/video: Standard     Home Equipment: Rollator (4 wheels);Wheelchair - Nurse, Children's (2 wheels)          Prior Functioning/Environment Prior Level of Function : Needs assist             Mobility Comments: 4WW for ambulation with HH PT (transfers only t/f [facility] with family assist) ADLs Comments: requires assist from family for all BADLs    OT Problem List:  Obesity;Cardiopulmonary status limiting activity;Decreased strength;Decreased range of motion;Decreased activity tolerance;Impaired balance (sitting and/or standing)   OT Treatment/Interventions: Self-care/ADL training;Therapeutic exercise;Energy conservation;DME and/or AE instruction;Therapeutic activities;Patient/family education;Balance training      OT Goals(Current goals can be found in the care plan section)   Acute Rehab OT Goals OT Goal Formulation: With patient Time For Goal Achievement: 04/14/24 Potential to Achieve Goals: Good   OT Frequency:  Min 2X/week       AM-PAC OT 6 Clicks Daily Activity     Outcome Measure Help from another person eating meals?: None Help from another person taking care of personal grooming?: A Little Help from another person toileting, which includes using toliet, bedpan, or urinal?: A Lot Help from another person bathing (including washing, rinsing, drying)?: A Lot Help from another person to put on and taking off regular upper body clothing?: A Lot Help from another person to put on and taking off regular lower body clothing?: A Lot 6 Click Score: 15   End of Session Equipment Utilized During Treatment: Oxygen  Nurse Communication: Mobility status  Activity Tolerance: Treatment limited secondary to medical complications (Comment) (cardiopulm status) Patient left: in bed;with call bell/phone within reach;with bed alarm  set  OT Visit Diagnosis: Muscle weakness (generalized) (M62.81);Other abnormalities of gait and mobility (R26.89);Unsteadiness on feet (R26.81)                Time: 1020-1045 OT Time Calculation (min): 25 min Charges:  OT General Charges $OT Visit: 1 Visit OT Evaluation $OT Eval Low Complexity: 1 Low  Emiline Mancebo L. Zakkiyya Barno, OTR/L  03/31/24, 12:28 PM

## 2024-03-31 NOTE — TOC Initial Note (Signed)
 Transition of Care [facility]) - Initial/Assessment Note   Patient Details  Name: Wendy Torres MRN: [id number] Date of Birth: May 13, 1940  [facility]) CM/SW Contact:    Duwaine GORMAN Aran, LCSW Phone Number: 03/31/2024, 3:52 PM  Clinical Narrative: Patient is from home and was active with [facility] for HHPT/OT. Patient currently receives home oxygen  and a 5L concentrator through [employer]. Care management to follow.  Expected Discharge Plan: Home w Home Health Services Barriers to Discharge: Continued Medical Work up  Expected Discharge Plan and Services In-house Referral: Clinical Social Work Post Acute Care Choice: Home Health Living arrangements for the past 2 months: Single Family Home           DME Arranged: N/A DME Agency: NA HH Arranged: PT, OT HH Agency: [facility] Date HH Agency Contacted: 03/31/24 Representative spoke with at [facility] Agency: Burnard  Prior Living Arrangements/Services Living arrangements for the past 2 months: Single Family Home Patient language and need for interpreter reviewed:: Yes Do you feel safe going back to the place where you live?: Yes      Need for Family Participation in Patient Care: No (Comment) Care giver support system in place?: Yes (comment) Current home services: DME, Home OT, Home PT (Active with [facility] for PT/OT. [employer] providing home oxygen .) Criminal Activity/Legal Involvement Pertinent to Current Situation/Hospitalization: No - Comment as needed  Activities of Daily Living ADL Screening (condition at time of admission) Independently performs ADLs?: No Does the patient have a NEW difficulty with bathing/dressing/toileting/self-feeding that is expected to last >3 days?: No Does the patient have a NEW difficulty with getting in/out of bed, walking, or climbing stairs that is expected to last >3 days?: No Does the patient have a NEW difficulty with communication that is expected to last >3 days?: No Is the patient deaf or  have difficulty hearing?: No Does the patient have difficulty seeing, even when wearing glasses/contacts?: No Does the patient have difficulty concentrating, remembering, or making decisions?: No  Emotional Assessment Alcohol  / Substance Use: Not Applicable Psych Involvement: No (comment)  Admission diagnosis:  Acute exacerbation of chronic obstructive pulmonary disease (COPD) (HCC) [J44.1] COPD exacerbation (HCC) [J44.1] Patient Active Problem List   Diagnosis Date Noted   CAP (community acquired pneumonia) 03/31/2024   COPD exacerbation (HCC) 03/31/2024   Acute exacerbation of chronic obstructive pulmonary disease (COPD) (HCC) 03/30/2024   Acute on chronic hypoxic respiratory failure (HCC) 03/03/2024   Acute on chronic diastolic congestive heart failure (HCC) 03/03/2024   Hoarseness 11/21/2023   Encounter for well adult exam with abnormal findings 10/28/2023   PHN (postherpetic neuralgia) 10/28/2023   Shingles outbreak 09/29/2023   Productive cough 04/30/2023   Cervicalgia 02/23/2023   Persistent headaches 02/23/2023   Cerebral vascular disease 02/23/2023   Hypercalcemia 02/05/2023   Right lumbar radiculopathy 02/03/2023   Solitary pulmonary nodule 02/03/2023   Occult blood in stools 01/18/2023   AVM (arteriovenous malformation) of small bowel, acquired 01/18/2023   COPD with acute exacerbation (HCC) 01/15/2023   Incidental pulmonary nodule 01/15/2023   Acute on chronic heart failure with preserved ejection fraction (HFpEF) (HCC) 01/15/2023   Bilateral hearing loss 11/11/2022   Left ear hearing loss 12/27/2021   Vitamin D  deficiency 11/02/2021   Carpal tunnel syndrome of right wrist 07/14/2021   Gastritis 12/01/2020   Vertigo 12/01/2020   Hemorrhoid 12/01/2020   Chronic respiratory failure with hypoxia (HCC) 10/18/2020   Anxiety 05/16/2020   Chronic obstructive pulmonary disease (HCC) 02/17/2020   Pain and  swelling of right knee 10/15/2019   Pain in left knee 11/10/2017    Prediabetes 10/03/2017   Congestive heart failure (CHF) (HCC) 10/05/2016   Elevated total protein 05/31/2016   Tinnitus 05/05/2015   Overactive bladder 11/12/2012   OA (osteoarthritis) of hip 11/06/2012   Dysphagia 10/23/2012   Osteoporosis 05/15/2012   DJD (degenerative joint disease) of knee 02/15/2012   Olecranon bursitis of right elbow 11/23/2011   Cervical radiculopathy 09/30/2011   Gait abnormality 09/04/2011   HIATAL HERNIA 03/01/2010   Chronic pain syndrome 02/16/2009   Iron  deficiency anemia 01/14/2009   Insomnia 11/05/2008   OVERACTIVE BLADDER 04/14/2008   Dyslipidemia, goal LDL below 130 04/18/2007   Morbid obesity (HCC) 12/09/2006   Essential hypertension 12/09/2006   COPD with chronic bronchitis and emphysema (HCC) 12/09/2006   GERD 12/09/2006   Osteoarthritis 12/09/2006   PCP:  [name], MD Pharmacy:   [facility] [phone] [address], [place] - [address] AT [facility] [address] [place] [address] Phone: [phone] Fax: [phone]  [facility] - [place], [place] - [address] [place] [address] Phone: [phone] Fax: [phone]  Social Drivers of Health (SDOH) Social History: SDOH Screenings   Food Insecurity: No Food Insecurity (03/30/2024)  Housing: Low Risk  (03/30/2024)  Transportation Needs: No Transportation Needs (03/30/2024)  Utilities: Not At Risk (03/30/2024)  Alcohol  Screen: Low Risk  (12/13/2023)  Depression (PHQ2-9): Low Risk  (03/20/2024)  Financial Resource Strain: Medium Risk (12/13/2023)  Physical Activity: Inactive (12/13/2023)  Social Connections: Moderately Isolated (03/30/2024)  Stress: No Stress Concern Present (12/13/2023)  Recent Concern: Stress - Stress Concern Present (09/28/2023)  Tobacco Use: Medium Risk (03/30/2024)  Health Literacy: Inadequate Health Literacy (12/13/2023)   SDOH Interventions:    Readmission  Risk Interventions    03/31/2024   10:23 AM  Readmission Risk Prevention Plan  Transportation Screening Complete  HRI or Home Care Consult Complete  Social Work Consult for Recovery Care Planning/Counseling Complete  Palliative Care Screening Not Applicable  Medication Review Oceanographer) Complete

## 2024-03-31 NOTE — Plan of Care (Signed)

## 2024-03-31 NOTE — Progress Notes (Signed)
 PT Cancellation Note  Patient Details Name: Wendy Torres MRN: [id number] DOB: 1941-03-28   Cancelled Treatment:    Reason Eval/Treat Not Completed:  Per OT, RN asking PT to be held on today. Will check back as schedule allows.    Dannial SQUIBB, PT Acute Rehabilitation  Office: [phone]

## 2024-04-01 DIAGNOSIS — J9621 Acute and chronic respiratory failure with hypoxia: Secondary | ICD-10-CM | POA: Diagnosis not present

## 2024-04-01 DIAGNOSIS — J441 Chronic obstructive pulmonary disease with (acute) exacerbation: Secondary | ICD-10-CM | POA: Diagnosis not present

## 2024-04-01 DIAGNOSIS — I5033 Acute on chronic diastolic (congestive) heart failure: Secondary | ICD-10-CM | POA: Diagnosis not present

## 2024-04-01 DIAGNOSIS — J189 Pneumonia, unspecified organism: Secondary | ICD-10-CM | POA: Diagnosis not present

## 2024-04-01 LAB — MAGNESIUM: Magnesium: 2.1 mg/dL (ref 1.7–2.4)

## 2024-04-01 LAB — BASIC METABOLIC PANEL WITH GFR
Anion gap: 10 (ref 5–15)
BUN: 20 mg/dL (ref 8–23)
CO2: 30 mmol/L (ref 22–32)
Calcium: 9.9 mg/dL (ref 8.9–10.3)
Chloride: 101 mmol/L (ref 98–111)
Creatinine, Ser: 1.04 mg/dL — ABNORMAL HIGH (ref 0.44–1.00)
GFR, Estimated: 52 mL/min — ABNORMAL LOW (ref >60)
Glucose, Bld: 104 mg/dL — ABNORMAL HIGH (ref 70–99)
Potassium: 3.3 mmol/L — ABNORMAL LOW (ref 3.5–5.1)
Sodium: 140 mmol/L (ref 135–145)

## 2024-04-01 LAB — CBC
HCT: 32.3 % — ABNORMAL LOW (ref 36.0–46.0)
Hemoglobin: 10.3 g/dL — ABNORMAL LOW (ref 12.0–15.0)
MCH: 26.1 pg (ref 26.0–34.0)
MCHC: 31.9 g/dL (ref 30.0–36.0)
MCV: 82 fL (ref 80.0–100.0)
Platelets: 345 K/uL (ref 150–400)
RBC: 3.94 MIL/uL (ref 3.87–5.11)
RDW: 15.3 % (ref 11.5–15.5)
WBC: 9.2 K/uL (ref 4.0–10.5)
nRBC: 0 % (ref 0.0–0.2)

## 2024-04-01 MED ORDER — IPRATROPIUM-ALBUTEROL 0.5-2.5 (3) MG/3ML IN SOLN
3.0000 mL | Freq: Three times a day (TID) | RESPIRATORY_TRACT | Status: DC
  Administered 2024-04-01: 3 mL via RESPIRATORY_TRACT
  Filled 2024-04-01: qty 3

## 2024-04-01 MED ORDER — PREDNISONE 10 MG PO TABS: ORAL_TABLET | ORAL | 0 refills | Status: AC

## 2024-04-01 MED ORDER — AZITHROMYCIN 500 MG PO TABS: 500.0000 mg | ORAL_TABLET | Freq: Every day | ORAL | 0 refills | Status: AC

## 2024-04-01 MED ORDER — AMOXICILLIN-POT CLAVULANATE 875-125 MG PO TABS: 1.0000 | ORAL_TABLET | Freq: Two times a day (BID) | ORAL | 0 refills | Status: DC

## 2024-04-01 NOTE — TOC Progression Note (Signed)
 Transition of Care [facility]) - Progression Note   Patient Details  Name: SPENSER CONG MRN: [id number] Date of Birth: 15-Oct-1940  Transition [facility]) CM/SW Contact  Duwaine GORMAN Aran, LCSW Phone Number: 04/01/2024, 10:05 AM  Clinical Narrative: Patient will discharge home today. HHPT/OT orders placed by hospitalist. CSW notified Burnard with [facility] of discharge. Care management signing off.  Expected Discharge Plan: Home w Home Health Services Barriers to Discharge: Barriers Resolved  Expected Discharge Plan and Services In-house Referral: Clinical Social Work Post Acute Care Choice: Home Health Living arrangements for the past 2 months: Single Family Home Expected Discharge Date: 04/01/24               DME Arranged: N/A DME Agency: NA HH Arranged: PT, OT HH Agency: [facility] Date HH Agency Contacted: 03/31/24 Representative spoke with at [facility] Agency: Burnard  Social Drivers of Health (SDOH) Interventions SDOH Screenings   Food Insecurity: No Food Insecurity (03/30/2024)  Housing: Low Risk  (03/30/2024)  Transportation Needs: No Transportation Needs (03/30/2024)  Utilities: Not At Risk (03/30/2024)  Alcohol  Screen: Low Risk  (12/13/2023)  Depression (PHQ2-9): Low Risk  (03/20/2024)  Financial Resource Strain: Medium Risk (12/13/2023)  Physical Activity: Inactive (12/13/2023)  Social Connections: Moderately Isolated (03/30/2024)  Stress: No Stress Concern Present (12/13/2023)  Recent Concern: Stress - Stress Concern Present (09/28/2023)  Tobacco Use: Medium Risk (03/30/2024)  Health Literacy: Inadequate Health Literacy (12/13/2023)   Readmission Risk Interventions    03/31/2024   10:23 AM  Readmission Risk Prevention Plan  Transportation Screening Complete  HRI or Home Care Consult Complete  Social Work Consult for Recovery Care Planning/Counseling Complete  Palliative Care Screening Not Applicable  Medication Review Oceanographer) Complete

## 2024-04-01 NOTE — Telephone Encounter (Signed)
 Dr. [name], They are still refusing ED and continue to want to know what to do when her sats drop.  I provided the recommendations from your last recommendations and from her last OV note.  I am not sure what else to tell this family.  Please advise.  Thank you.

## 2024-04-01 NOTE — Discharge Summary (Signed)
 Physician Discharge Summary   Patient: Wendy Torres MRN: [id number] DOB: [birth year]  Admit date:     03/30/2024  Discharge date: 04/01/24  Discharge Physician: Carliss LELON Canales   PCP: Norleen Lynwood LELON, MD   Recommendations at discharge:    Pt to be discharged home with home health.   If you experience worsening fever, chills, chest pain, shortness of breath, or other concerning symptoms, please call your PCP or go to the emergency department immediately.  Discharge Diagnoses: Principal Problem:   Acute exacerbation of chronic obstructive pulmonary disease (COPD) (HCC) Active Problems:   Acute on chronic heart failure with preserved ejection fraction (HFpEF) (HCC)   Acute on chronic hypoxic respiratory failure (HCC)   CAP (community acquired pneumonia)   COPD exacerbation (HCC)  Resolved Problems:   * No resolved hospital problems. *   Hospital Course:  83 y.o. female with medical history significant for COPD, chronic hypoxia on 4 L nasal cannula at baseline, chronic HFpEF, hypertension, hyperlipidemia, GERD, CKD 3A, situational anxiety, on Xanax  as needed, who presents to the ER with complaints of shortness of breath, worsening for the past 2 days.    Assessment and Plan:   Acute on chronic hypoxic respiratory failure - Baseline 4 L nasal cannula, initially needing nonrebreather.  Was satting 75% on 4 L.  Showed improvement back to her baseline throughout the course of hospital stay.  Acute COPD exacerbation - Worsening hypoxia, dyspnea, and wheezing on presentation.  Initiated on IV Solu-Medrol , scheduled nebulizers, empiric antibiotics.  Appears to be showing improvement.  No cough, O2 back to 4 L baseline.  Will transition patient to p.o. prednisone  taper to take as directed upon discharge.  Resume home inhaler/nebulizer therapy.   Possible community-acquired pneumonia - Imaging noting right lower lobe patchiness.  Rapid COVID/flu/RSV negative.  Initiated on empiric  ceftriaxone  plus azithromycin .  Will transition to empiric p.o. Augmentin  plus azithromycin  to take as directed upon discharge.  Chronic HFpEF - Echo earlier this month noting EF 70-75% with grade 1 DD.  Likely contributing to patient's hypoxia.  Responded well to IV Lasix  40 mg twice daily.  Resume home cardiac regimen upon discharge.  Hypertension - Restart home antihypertensive regiment.    CKD 3A - Creatinine appears to be at baseline.     Physical debilitation muscle weakness - PT/OT recommending home health   Consultants: None Procedures performed: None Disposition: Home health Diet recommendation:  Discharge Diet Orders (From admission, onward)     Start     Ordered   04/01/24 0000  Diet - low sodium heart healthy        04/01/24 0931           Cardiac diet  DISCHARGE MEDICATION: Allergies as of 04/01/2024       Reactions   Benazepril  Shortness Of Breath, Swelling, Other (See Comments)   Face became swollen, plus eyes and cheeks (too)   Chlorthalidone Shortness Of Breath        Medication List     TAKE these medications    acetaminophen  325 MG tablet Commonly known as: TYLENOL  Take 2 tablets (650 mg total) by mouth every 6 (six) hours as needed for mild pain (or Fever >/= 101).   albuterol  108 (90 Base) MCG/ACT inhaler Commonly known as: VENTOLIN  HFA Inhale 2 puffs into the lungs 3 (three) times daily for 3 days, THEN 2 puffs every 4 (four) hours as needed for wheezing or shortness of breath. Start taking on: March 07, 2024  ALPRAZolam  0.25 MG tablet Commonly known as: XANAX  Take 1 tablet (0.25 mg total) by mouth 2 (two) times daily as needed. What changed: reasons to take this   amLODipine -valsartan  10-320 MG tablet Commonly known as: EXFORGE  Take 1 tablet by mouth daily.   amoxicillin -clavulanate 875-125 MG tablet Commonly known as: AUGMENTIN  Take 1 tablet by mouth 2 (two) times daily.   aspirin  EC 81 MG tablet Take 1 tablet (81 mg  total) by mouth daily. Swallow whole.   azithromycin  500 MG tablet Commonly known as: Zithromax  Take 1 tablet (500 mg total) by mouth daily for 3 days.   cloNIDine  0.2 MG tablet Commonly known as: CATAPRES  TAKE 1 TABLET BY MOUTH TWICE  DAILY   fluticasone  50 MCG/ACT nasal spray Commonly known as: FLONASE  Place 2 sprays into both nostrils daily for 7 days. What changed:  when to take this reasons to take this   furosemide  40 MG tablet Commonly known as: LASIX  Take 1 tablet (40 mg total) by mouth daily.   gabapentin  600 MG tablet Commonly known as: NEURONTIN  TAKE 1 TABLET BY MOUTH 3 TIMES  DAILY   guaiFENesin  600 MG 12 hr tablet Commonly known as: MUCINEX  Take 1 tablet (600 mg total) by mouth 2 (two) times daily. What changed:  when to take this reasons to take this   hydrALAZINE  100 MG tablet Commonly known as: APRESOLINE  TAKE 1 TABLET BY MOUTH 3 TIMES  DAILY   ipratropium-albuterol  0.5-2.5 (3) MG/3ML Soln Commonly known as: DUONEB Take 3 mLs by nebulization every 4 (four) hours as needed (wheezing or shortness of breath).   iron  polysaccharides 150 MG capsule Commonly known as: NIFEREX TAKE 1 CAPSULE BY MOUTH EVERY DAY   loratadine  10 MG tablet Commonly known as: CLARITIN  Take 1 tablet (10 mg total) by mouth daily.   lovastatin  40 MG tablet Commonly known as: MEVACOR  TAKE 1 TABLET BY MOUTH AT  BEDTIME   meclizine  12.5 MG tablet Commonly known as: ANTIVERT  Take 1 tablet (12.5 mg total) by mouth 3 (three) times daily as needed. What changed: reasons to take this   nystatin  100000 UNIT/ML suspension Commonly known as: MYCOSTATIN  Swish and spit 5mL two times daily after using Symbicort . What changed:  how much to take when to take this additional instructions   OXYGEN  Inhale 4 L/min into the lungs continuous.   pantoprazole  40 MG tablet Commonly known as: PROTONIX  TAKE 1 TABLET BY MOUTH DAILY   polyethylene glycol 17 g packet Commonly known as:  MIRALAX  / GLYCOLAX  Take 17 g by mouth daily as needed for mild constipation or moderate constipation.   potassium chloride  SA 20 MEQ tablet Commonly known as: Klor-Con  M20 Take 1 tablet (20 mEq total) by mouth daily.   predniSONE  10 MG tablet Commonly known as: DELTASONE  Take 4 tablets (40 mg total) by mouth daily for 3 days, THEN 3 tablets (30 mg total) daily for 3 days, THEN 2 tablets (20 mg total) daily for 3 days, THEN 1 tablet (10 mg total) daily for 3 days. Start taking on: April 01, 2024   QC TUMERIC COMPLEX PO Take 1 tablet by mouth daily.   solifenacin  10 MG tablet Commonly known as: VESICARE  TAKE 1 TABLET BY MOUTH EVERY DAY   tiZANidine  4 MG tablet Commonly known as: ZANAFLEX  Take 1 tablet (4 mg total) by mouth every 6 (six) hours as needed for muscle spasms.   traMADol  50 MG tablet Commonly known as: ULTRAM  Take 1 tablet (50 mg total) by mouth every  6 (six) hours as needed. for pain   traZODone  50 MG tablet Commonly known as: DESYREL  TAKE 1/2 TO 1 TABLET BY MOUTH AT BEDTIME AS NEEDED FOR SLEEP What changed:  how much to take when to take this additional instructions   Trelegy Ellipta  200-62.5-25 MCG/ACT Aepb Generic drug: Fluticasone -Umeclidin-Vilant Inhale 1 Inhalation into the lungs daily.   triamcinolone  cream 0.1 % Commonly known as: KENALOG  Apply topically 2 (two) times daily. What changed:  how much to take when to take this reasons to take this   Vitamin D3 50 MCG (2000 UT) capsule Take 2,000 Units by mouth daily.         Discharge Exam: Filed Weights   03/30/24 1941 03/31/24 0500 04/01/24 0500  Weight: 88 kg 88.2 kg 87.7 kg    GENERAL:  Alert, pleasant, no acute distress, obese HEENT:  EOMI, nasal cannula CARDIOVASCULAR:  RRR, no murmurs appreciated RESPIRATORY: Very poor air movement bilaterally, no wheezing GASTROINTESTINAL:  Soft, nontender, nondistended EXTREMITIES:  No LE edema bilaterally NEURO:  No new focal deficits  appreciated SKIN:  No rashes noted PSYCH:  Appropriate mood and affect     Condition at discharge: improving  The results of significant diagnostics from this hospitalization (including imaging, microbiology, ancillary and laboratory) are listed below for reference.   Imaging Studies: DG Chest Portable 1 View Result Date: 03/30/2024 EXAM: 1 VIEW(S) XRAY OF THE CHEST 03/30/2024 08:00:00 PM COMPARISON: 03/06/2024 CLINICAL HISTORY: COPD vs CHF FINDINGS: LUNGS AND PLEURA: Chronic hyperinflation. Increased basilar airspace and interstitial opacities compared to 03/06/24. No pleural effusion. No pneumothorax. HEART AND MEDIASTINUM: Chronic cardiomegaly. Unchanged mediastinal contours with aortic atherosclerosis and tortuosity. BONES AND SOFT TISSUES: Remote right rib fractures. IMPRESSION: 1. Increased basilar airspace and interstitial opacities compared to 03/06/24. Progressive atelectasis versus pneumonia or aspiration. Electronically signed by: Norman Gatlin MD 03/30/2024 08:12 PM EST RP Workstation: [id number]   DG CHEST PORT 1 VIEW Result Date: 03/06/2024 EXAM: 1 VIEW(S) XRAY OF THE CHEST 03/06/2024 01:22:00 PM COMPARISON: 03/03/2024 CLINICAL HISTORY: Hypoxia 200808 FINDINGS: LUNGS AND PLEURA: Improved aeration of lung bases. Persistent left basilar atelectasis. No pulmonary edema. No pleural effusion. No pneumothorax. HEART AND MEDIASTINUM: Cardiomegaly, unchanged. Aortic arch calcifications. BONES AND SOFT TISSUES: Old right rib fractures. IMPRESSION: 1. Improved aeration of the lung bases with persistent left basilar atelectasis. No pulmonary edema, pleural effusion, or pneumothorax. Electronically signed by: Waddell Calk MD 03/06/2024 04:15 PM EST RP Workstation: [id number]   ECHOCARDIOGRAM COMPLETE Result Date: 03/04/2024    ECHOCARDIOGRAM REPORT   Patient Name:   Wendy Torres Date of Exam: 03/04/2024 Medical Rec #:  [id number]      Height:       64.0 in Accession #:    [id number]     Weight:        194.9 lb Date of Birth:  1940/12/21      BSA:          1.935 m Patient Age:    83 years       BP:           174/77 mmHg Patient Gender: F              HR:           81 bpm. Exam Location:  Inpatient Procedure: 2D Echo, Cardiac Doppler and Color Doppler (Both Spectral and Color            Flow Doppler were utilized during procedure). Indications:    Dyspnea R06.00  History:  Patient has prior history of Echocardiogram examinations, most                 recent 04/06/2023.  Sonographer:    Tinnie Gosling RDCS Referring Phys: [phone] DAVID MANUEL ORTIZ IMPRESSIONS  1. Left ventricular ejection fraction, by estimation, is 70 to 75%. The left ventricle has hyperdynamic function. The left ventricle has no regional wall motion abnormalities. There is moderate concentric left ventricular hypertrophy. Left ventricular diastolic parameters are consistent with Grade I diastolic dysfunction (impaired relaxation).  2. Right ventricular systolic function is normal. The right ventricular size is moderately enlarged. There is mildly elevated pulmonary artery systolic pressure.  3. Left atrial size was mildly dilated.  4. The mitral valve is normal in structure. No evidence of mitral valve regurgitation. No evidence of mitral stenosis.  5. The aortic valve was not well visualized. Aortic valve regurgitation is not visualized.  6. The inferior vena cava is dilated in size with <50% respiratory variability, suggesting right atrial pressure of 20 mmHg. Comparison(s): Prior images reviewed side by side. Right ventricle has further dilated; TR not as well assessed in this study. FINDINGS  Left Ventricle: Left ventricular ejection fraction, by estimation, is 70 to 75%. The left ventricle has hyperdynamic function. The left ventricle has no regional wall motion abnormalities. The left ventricular internal cavity size was normal in size. There is moderate concentric left ventricular hypertrophy. Left ventricular diastolic parameters  are consistent with Grade I diastolic dysfunction (impaired relaxation). Right Ventricle: The right ventricular size is moderately enlarged. No increase in right ventricular wall thickness. Right ventricular systolic function is normal. There is mildly elevated pulmonary artery systolic pressure. The tricuspid regurgitant velocity is 2.48 m/s, and with an assumed right atrial pressure of 20 mmHg, the estimated right ventricular systolic pressure is 44.6 mmHg. Left Atrium: Left atrial size was mildly dilated. Right Atrium: Right atrial size was normal in size. Pericardium: There is no evidence of pericardial effusion. Mitral Valve: The mitral valve is normal in structure. No evidence of mitral valve regurgitation. No evidence of mitral valve stenosis. Tricuspid Valve: The tricuspid valve is normal in structure. Tricuspid valve regurgitation is trivial. No evidence of tricuspid stenosis. Aortic Valve: The aortic valve was not well visualized. Aortic valve regurgitation is not visualized. Pulmonic Valve: The pulmonic valve was not well visualized. Pulmonic valve regurgitation is not visualized. No evidence of pulmonic stenosis. Aorta: The aortic root and ascending aorta are structurally normal, with no evidence of dilitation. Venous: The inferior vena cava is dilated in size with less than 50% respiratory variability, suggesting right atrial pressure of 15 mmHg. IAS/Shunts: The atrial septum is grossly normal.  LEFT VENTRICLE PLAX 2D LVIDd:         4.70 cm   Diastology LVIDs:         3.80 cm   LV e' medial:    4.57 cm/s LV PW:         1.30 cm   LV E/e' medial:  22.8 LV IVS:        1.30 cm   LV e' lateral:   8.59 cm/s LVOT diam:     2.10 cm   LV E/e' lateral: 12.1 LV SV:         95 LV SV Index:   49 LVOT Area:     3.46 cm LV IVRT:       95 msec  RIGHT VENTRICLE             IVC RV  S prime:     17.00 cm/s  IVC diam: 2.60 cm TAPSE (M-mode): 2.5 cm                             PULMONARY VEINS                              Diastolic Velocity: 37.20 cm/s                             S/D Velocity:       1.20                             Systolic Velocity:  43.20 cm/s LEFT ATRIUM             Index        RIGHT ATRIUM           Index LA Vol (A2C):   60.7 ml 31.37 ml/m  RA Area:     20.30 cm LA Vol (A4C):   71.9 ml 37.16 ml/m  RA Volume:   53.60 ml  27.70 ml/m LA Biplane Vol: 67.5 ml 34.88 ml/m  AORTIC VALVE LVOT Vmax:   130.00 cm/s LVOT Vmean:  85.800 cm/s LVOT VTI:    0.275 m  AORTA Ao Root diam: 2.80 cm Ao Asc diam:  3.30 cm MITRAL VALVE                TRICUSPID VALVE MV Area (PHT): 3.03 cm     TR Peak grad:   24.6 mmHg MV Decel Time: 250 msec     TR Vmax:        248.00 cm/s MV E velocity: 104.00 cm/s MV A velocity: 122.00 cm/s  SHUNTS MV E/A ratio:  0.85         Systemic VTI:  0.28 m                             Systemic Diam: 2.10 cm Stanly Leavens MD Electronically signed by Stanly Leavens MD Signature Date/Time: 03/04/2024/10:52:29 AM    Final    DG Chest Portable 1 View Result Date: 03/03/2024 CLINICAL DATA:  Shortness of breath EXAM: PORTABLE CHEST 1 VIEW COMPARISON:  November 25, 2023 FINDINGS: There is cardiomegaly with increased basilar infiltrate most likely edema. No pleural effusion or pneumothorax. IMPRESSION: Cardiomegaly with increased basilar infiltrates most likely pulmonary edema Electronically Signed   By: Nancyann Burns M.D.   On: 03/03/2024 12:59    Microbiology: Results for orders placed or performed during the hospital encounter of 03/30/24  Resp panel by RT-PCR (RSV, Flu A&B, Covid) Anterior Nasal Swab     Status: None   Collection Time: 03/30/24  8:15 PM   Specimen: Anterior Nasal Swab  Result Value Ref Range Status   SARS Coronavirus 2 by RT PCR NEGATIVE NEGATIVE Final    Comment: (NOTE) SARS-CoV-2 target nucleic acids are NOT DETECTED.  The SARS-CoV-2 RNA is generally detectable in upper respiratory specimens during the acute phase of infection. The lowest concentration of SARS-CoV-2  viral copies this assay can detect is 138 copies/mL. A negative result does not preclude SARS-Cov-2 infection and should not be used as the sole basis for treatment or other patient management decisions. A negative result  may occur with  improper specimen collection/handling, submission of specimen other than nasopharyngeal swab, presence of viral mutation(s) within the areas targeted by this assay, and inadequate number of viral copies(<138 copies/mL). A negative result must be combined with clinical observations, patient history, and epidemiological information. The expected result is Negative.  Fact Sheet for Patients:  bloggercourse.com  Fact Sheet for Healthcare Providers:  seriousbroker.it  This test is no t yet approved or cleared by the [place]  FDA and  has been authorized for detection and/or diagnosis of SARS-CoV-2 by FDA under an Emergency Use Authorization (EUA). This EUA will remain  in effect (meaning this test can be used) for the duration of the COVID-19 declaration under Section 564(b)(1) of the Act, 21 U.S.C.section 360bbb-3(b)(1), unless the authorization is terminated  or revoked sooner.       Influenza A by PCR NEGATIVE NEGATIVE Final   Influenza B by PCR NEGATIVE NEGATIVE Final    Comment: (NOTE) The Xpert Xpress SARS-CoV-2/FLU/RSV plus assay is intended as an aid in the diagnosis of influenza from Nasopharyngeal swab specimens and should not be used as a sole basis for treatment. Nasal washings and aspirates are unacceptable for Xpert Xpress SARS-CoV-2/FLU/RSV testing.  Fact Sheet for Patients: bloggercourse.com  Fact Sheet for Healthcare Providers: seriousbroker.it  This test is not yet approved or cleared by the [place]  FDA and has been authorized for detection and/or diagnosis of SARS-CoV-2 by FDA under an Emergency Use Authorization  (EUA). This EUA will remain in effect (meaning this test can be used) for the duration of the COVID-19 declaration under Section 564(b)(1) of the Act, 21 U.S.C. section 360bbb-3(b)(1), unless the authorization is terminated or revoked.     Resp Syncytial Virus by PCR NEGATIVE NEGATIVE Final    Comment: (NOTE) Fact Sheet for Patients: bloggercourse.com  Fact Sheet for Healthcare Providers: seriousbroker.it  This test is not yet approved or cleared by the [place]  FDA and has been authorized for detection and/or diagnosis of SARS-CoV-2 by FDA under an Emergency Use Authorization (EUA). This EUA will remain in effect (meaning this test can be used) for the duration of the COVID-19 declaration under Section 564(b)(1) of the Act, 21 U.S.C. section 360bbb-3(b)(1), unless the authorization is terminated or revoked.  Performed at [facility], [address], [place], [place] [address]   Expectorated Sputum Assessment w Gram Stain, Rflx to Resp Cult     Status: None   Collection Time: 03/31/24  3:44 AM   Specimen: Sputum  Result Value Ref Range Status   Specimen Description SPUTUM  Final   Special Requests NONE  Final   Sputum evaluation   Final    THIS SPECIMEN IS ACCEPTABLE FOR SPUTUM CULTURE Performed at [facility], [address] [place], [place] [address]    Report Status 03/31/2024 FINAL  Final  Culture, Respiratory w Gram Stain     Status: None (Preliminary result)   Collection Time: 03/31/24  3:44 AM   Specimen: SPU  Result Value Ref Range Status   Specimen Description   Final    SPUTUM Performed at [facility], [address] [place], [place] [address]    Special Requests   Final    NONE Reflexed from [id number] Performed at [facility], [address], [place], [place] [address]    Gram Stain   Final    RARE WBC PRESENT, PREDOMINANTLY  PMN MODERATE GRAM POSITIVE COCCI Performed at [facility],  [address], [place], [place] [address]    Culture PENDING  Incomplete   Report Status PENDING  Incomplete  Respiratory (~20 pathogens) panel by PCR     Status: None   Collection Time: 03/31/24 10:46 AM   Specimen: Nasopharyngeal Swab; Respiratory  Result Value Ref Range Status   Adenovirus NOT DETECTED NOT DETECTED Final   Coronavirus 229E NOT DETECTED NOT DETECTED Final    Comment: (NOTE) The Coronavirus on the Respiratory Panel, DOES NOT test for the novel  Coronavirus (2019 nCoV)    Coronavirus HKU1 NOT DETECTED NOT DETECTED Final   Coronavirus NL63 NOT DETECTED NOT DETECTED Final   Coronavirus OC43 NOT DETECTED NOT DETECTED Final   Metapneumovirus NOT DETECTED NOT DETECTED Final   Rhinovirus / Enterovirus NOT DETECTED NOT DETECTED Final   Influenza A NOT DETECTED NOT DETECTED Final   Influenza B NOT DETECTED NOT DETECTED Final   Parainfluenza Virus 1 NOT DETECTED NOT DETECTED Final   Parainfluenza Virus 2 NOT DETECTED NOT DETECTED Final   Parainfluenza Virus 3 NOT DETECTED NOT DETECTED Final   Parainfluenza Virus 4 NOT DETECTED NOT DETECTED Final   Respiratory Syncytial Virus NOT DETECTED NOT DETECTED Final   Bordetella pertussis NOT DETECTED NOT DETECTED Final   Bordetella Parapertussis NOT DETECTED NOT DETECTED Final   Chlamydophila pneumoniae NOT DETECTED NOT DETECTED Final   Mycoplasma pneumoniae NOT DETECTED NOT DETECTED Final    Comment: Performed at [facility], [address] [place], [place] [address]   *Note: Due to a large number of results and/or encounters for the requested time period, some results have not been displayed. A complete set of results can be found in Results Review.    Labs: CBC: Recent Labs  Lab 03/30/24 2006 03/31/24 0422 04/01/24 0513  WBC 5.8 5.0 9.2  NEUTROABS  --  4.5  --   HGB 10.2* 10.3* 10.3*  HCT 33.0* 32.7* 32.3*  MCV 84.6 84.3 82.0  PLT 292 309 345    Basic Metabolic Panel: Recent Labs  Lab 03/30/24 2006 03/31/24 0422 04/01/24 0513  NA 137 137 140  K 4.0 3.7 3.3*  CL 98 100 101  CO2 30 27 30   GLUCOSE 126* 159* 104*  BUN 15 14 20   CREATININE 1.14* 0.95 1.04*  CALCIUM 10.4* 10.1 9.9  MG  --  1.9 2.1  PHOS  --  2.1*  --    Liver Function Tests: No results for input(s): AST, ALT, ALKPHOS, BILITOT, PROT, ALBUMIN in the last 168 hours. CBG: No results for input(s): GLUCAP in the last 168 hours.  Discharge time spent: 25 minutes.  Length of inpatient stay: 2 days  Signed: Carliss LELON Canales, DO [facility] 04/01/2024

## 2024-04-01 NOTE — Telephone Encounter (Signed)
 Looks like she was admitted from 11/30 to 12/2 (today) for COPD exacerbation. Can you please make a hospital follow up visit in 1 week with me or APP

## 2024-04-01 NOTE — Evaluation (Signed)
 Physical Therapy Evaluation Patient Details Name: Wendy Torres MRN: [id number] DOB: 06-17-1940 Today's Date: 04/01/2024  History of Present Illness  83 y.o. female admitted with acute exacerbation of COPD & acute on chronic hypoxic respiratory failure. PMH significant for COPD, chronic hypoxia on 4 L O2 at baseline, chronic HFpEF, CHF, HTN, hyperlipidemia, GERD, CKD 3A, situational anxiety, morbid obesity, osteoporosis, TIA, L THA  Clinical Impression  On eval, pt was CGA for mobility. Session began with pt performing standing pre-gait marching to see if she could potentially tolerate ambulation. After resting, pt then took 2 short walks around the room, ~10 feet each time, with use of RW. O2: 82% on 4L, 87% on 6L; HR 130 bpm. Pt c/o dizziness during each walk-assessed BP-207/65 then 161/89. Placed pt back on wall O2 sats >90%. Send secure chat to RN and MD with above info. Discussed d/c plan-pt plans to return home where she lives with family. She reports family is able to assist as needed. Will recommend HHPT f/u.         If plan is discharge home, recommend the following: A little help with walking and/or transfers;A little help with bathing/dressing/bathroom;Assistance with cooking/housework;Assist for transportation;Help with stairs or ramp for entrance   Can travel by private vehicle        Equipment Recommendations None recommended by PT  Recommendations for Other Services       Functional Status Assessment Patient has had a recent decline in their functional status and demonstrates the ability to make significant improvements in function in a reasonable and predictable amount of time.     Precautions / Restrictions Precautions Precautions: Fall Precaution/Restrictions Comments: monitor O2 Restrictions Weight Bearing Restrictions Per Provider Order: No      Mobility  Bed Mobility               General bed mobility comments: oob in recliner    Transfers Overall  transfer level: Needs assistance Equipment used: Rolling walker (2 wheels) Transfers: Sit to/from Stand Sit to Stand: Supervision           General transfer comment: supv for safety.    Ambulation/Gait Ambulation/Gait assistance: Contact guard assist Gait Distance (Feet): 10 Feet Assistive device: Rolling walker (2 wheels)         General Gait Details: x2-short walks in the room. Pt c/o dizziness each time. After 2nd walk, BP 207/65-took it a 2nd time after a few minutes, reading was 161/89. O2: 82% on 4L, 87% on 6L. HR 130 bpm.  Stairs            Wheelchair Mobility     Tilt Bed    Modified Rankin (Stroke Patients Only)       Balance Overall balance assessment: Needs assistance         Standing balance support: During functional activity Standing balance-Leahy Scale: Fair                               Pertinent Vitals/Pain Pain Assessment Pain Assessment: Faces Faces Pain Scale: Hurts little more Pain Location: head, low back Pain Descriptors / Indicators: Aching Pain Intervention(s): Monitored during session, Repositioned    Home Living Family/patient expects to be discharged to:: Private residence Living Arrangements: Children Available Help at Discharge: Family;Available PRN/intermittently Type of Home: House Home Access: Ramped entrance       Home Layout: One level Home Equipment: Rollator (4 wheels);Wheelchair - Nurse, Children[facility] (2 wheels)  Prior Function Prior Level of Function : Needs assist             Mobility Comments: 4WW for ambulation with HH PT (transfers only t/f [facility] with family assist) ADLs Comments: requires assist from family for all BADLs     Extremity/Trunk Assessment   Upper Extremity Assessment Upper Extremity Assessment: Defer to OT evaluation    Lower Extremity Assessment Lower Extremity Assessment: Generalized weakness    Cervical / Trunk Assessment Cervical / Trunk  Assessment: Kyphotic  Communication   Communication Communication: No apparent difficulties    Cognition Arousal: Alert Behavior During Therapy: WFL for tasks assessed/performed   PT - Cognitive impairments: No apparent impairments                         Following commands: Intact       Cueing Cueing Techniques: Verbal cues     General Comments      Exercises     Assessment/Plan    PT Assessment Patient needs continued PT services  PT Problem List Decreased activity tolerance;Decreased balance;Decreased mobility       PT Treatment Interventions Gait training;Functional mobility training;Therapeutic activities;Therapeutic exercise;Patient/family education;Balance training    PT Goals (Current goals can be found in the Care Plan section)  Acute Rehab PT Goals Patient Stated Goal: home soon PT Goal Formulation: With patient Time For Goal Achievement: 04/01/24 Potential to Achieve Goals: Good    Frequency Min 3X/week     Co-evaluation               AM-PAC PT 6 Clicks Mobility  Outcome Measure Help needed turning from your back to your side while in a flat bed without using bedrails?: A Little Help needed moving from lying on your back to sitting on the side of a flat bed without using bedrails?: A Little Help needed moving to and from a bed to a chair (including a wheelchair)?: A Little Help needed standing up from a chair using your arms (e.g., wheelchair or bedside chair)?: A Little Help needed to walk in hospital room?: A Little Help needed climbing 3-5 steps with a railing? : A Lot 6 Click Score: 17    End of Session Equipment Utilized During Treatment: Oxygen  Activity Tolerance: Patient limited by fatigue (limited by dizziness) Patient left: in chair;with call bell/phone within reach;with family/visitor present;with nursing/sitter in room   PT Visit Diagnosis: Unsteadiness on feet (R26.81);Difficulty in walking, not elsewhere classified  (R26.2)    Time: 9063-8995 PT Time Calculation (min) (ACUTE ONLY): 28 min   Charges:   PT Evaluation $PT Eval Low Complexity: 1 Low PT Treatments $Gait Training: 8-22 mins PT General Charges $$ ACUTE PT VISIT: 1 Visit          Dannial SQUIBB, PT Acute Rehabilitation  Office: [phone]

## 2024-04-02 LAB — CULTURE, RESPIRATORY W GRAM STAIN

## 2024-04-03 ENCOUNTER — Telehealth: Payer: Self-pay

## 2024-04-03 ENCOUNTER — Inpatient Hospital Stay: Admitting: Pulmonary Disease

## 2024-04-03 ENCOUNTER — Encounter: Payer: Self-pay | Admitting: Internal Medicine

## 2024-04-03 ENCOUNTER — Ambulatory Visit: Admitting: Pulmonary Disease

## 2024-04-03 MED ORDER — LEVOFLOXACIN 500 MG PO TABS: 500.0000 mg | ORAL_TABLET | Freq: Every day | ORAL | 0 refills | Status: DC

## 2024-04-03 NOTE — Telephone Encounter (Signed)
 Copied from CRM #[id number]. Topic: Clinical - Home Health Verbal Orders >> Apr 03, 2024 10:28 AM Nessti S wrote: Caller/Agency: [facility] Callback Number: [phone] Service Requested: Skilled Nursing Frequency: 1x every other wk Any new concerns about the patient? No

## 2024-04-03 NOTE — Telephone Encounter (Signed)
 Ok for [employer]

## 2024-04-04 NOTE — Telephone Encounter (Signed)
 LDVM for [facility] Callback Number: [phone] Service Requested: Skilled Nursing Frequency: 1x every other wk

## 2024-04-07 ENCOUNTER — Telehealth: Payer: Self-pay

## 2024-04-07 NOTE — Telephone Encounter (Signed)
 Ok for [employer]

## 2024-04-07 NOTE — Telephone Encounter (Signed)
 Copied from CRM [id number]. Topic: Clinical - Home Health Verbal Orders >> Apr 07, 2024  2:39 PM Jasmin G wrote: Caller/Agency: Mr. Signe from [facility]  Callback Number: [phone] Service Requested: Physical Therapy Frequency: 2 week 1, 1 week 4 Any new concerns about the patient? No

## 2024-04-08 NOTE — Telephone Encounter (Signed)
 Called and left voicemail giving verbals.

## 2024-04-09 ENCOUNTER — Telehealth: Payer: Self-pay

## 2024-04-09 NOTE — Telephone Encounter (Signed)
 Apt scheduled. NFN

## 2024-04-09 NOTE — Telephone Encounter (Signed)
 Patient wants an apt 12/19 in the morning. Called pt to make apt-no answer lvm

## 2024-04-09 NOTE — Telephone Encounter (Signed)
 Video visit scheduled 12/16 per Dr [name] and patients request. NFN Copied from CRM [id number]. Topic: Appointments - Scheduling Inquiry for Clinic >> Apr 09, 2024 12:31 PM [name] wrote: Reason for CRM: Patient's child Katrina [phone] is returning the office/Nary Sneed's call on scheduling patient's hospital follow up. Unable to reach CAL, please call back.

## 2024-04-10 ENCOUNTER — Inpatient Hospital Stay

## 2024-04-10 ENCOUNTER — Inpatient Hospital Stay: Admitting: Oncology

## 2024-04-11 ENCOUNTER — Telehealth: Payer: Self-pay

## 2024-04-11 NOTE — Telephone Encounter (Signed)
 Copied from CRM #[id number]. Topic: Clinical - Home Health Verbal Orders >> Apr 11, 2024 12:57 PM Drema MATSU wrote: Eleanor with [facility] wants to reports a missed occupational therapy visit  Pt daughter declined it.

## 2024-04-15 ENCOUNTER — Ambulatory Visit: Admitting: Pulmonary Disease

## 2024-04-15 ENCOUNTER — Telehealth: Admitting: Pulmonary Disease

## 2024-04-15 ENCOUNTER — Encounter: Payer: Self-pay | Admitting: Pulmonary Disease

## 2024-04-15 VITALS — BP 149/89 | HR 56 | Ht 64.0 in | Wt 193.0 lb

## 2024-04-15 DIAGNOSIS — J439 Emphysema, unspecified: Secondary | ICD-10-CM

## 2024-04-15 DIAGNOSIS — J441 Chronic obstructive pulmonary disease with (acute) exacerbation: Secondary | ICD-10-CM | POA: Diagnosis not present

## 2024-04-15 DIAGNOSIS — J9611 Chronic respiratory failure with hypoxia: Secondary | ICD-10-CM | POA: Diagnosis not present

## 2024-04-15 DIAGNOSIS — Z87891 Personal history of nicotine dependence: Secondary | ICD-10-CM | POA: Diagnosis not present

## 2024-04-15 DIAGNOSIS — Z9981 Dependence on supplemental oxygen: Secondary | ICD-10-CM | POA: Diagnosis not present

## 2024-04-15 DIAGNOSIS — J189 Pneumonia, unspecified organism: Secondary | ICD-10-CM | POA: Diagnosis not present

## 2024-04-15 NOTE — Progress Notes (Signed)
 [address]              TRISTIAN Torres    [id number]    Sep 13, 1940  Primary Care Physician:Wendy Torres, Torres ORN, MD  Referring Physician: Norleen Torres ORN, MD [address] [place],  [place] [address]  Virtual Visit via Video Note  I connected with Wendy Torres on 04/15/2024 at  1:30 PM EST by a video enabled telemedicine application and verified that I am speaking with the correct person using two identifiers.  Location: Patient: Home Provider: [phone]   I discussed the limitations of evaluation and management by telemedicine and the availability of in person appointments. The patient expressed understanding and agreed to proceed.  Chief complaint: Follow-up for COPD  HPI: 83 y.o.  with history of emphysema, hypertension, diastolic heart failure.  Referred here for evaluation of emphysema Complaints of dyspnea with activity, no symptoms at rest.  Denies any cough, sputum production, wheezing She was hospitalized in April 2018 for COPD exacerbation, CHF exacerbation and has been on supplemental oxygen  since then Symbicort  changed to [facility] in 2019 and she is doing well with this change.  Then she got back to Symbicort  due to insurance reasons  Interim history: Discussed the use of AI scribe software for clinical note transcription with the patient, who gave verbal consent to proceed. History of Present Illness Wendy Torres is an 83 year old female with COPD and acute diastolic heart failure who presents for follow-up after recent hospitalization for pulmonary edema. She is accompanied by her daughter.  Pulmonary edema and hypoxemia - Hospitalized from November 3rd to November 7th for COPD exacerbation and CHF with pulmonary edema with oxygen  saturation levels dropping to 60% during movement - Hospitalized for three days and discharged on April 01, 2024 for pneumonia confirmed by chest x-ray - Received antibiotics, IV Solu Medrol , and nebulized treatments during hospitalization -  Completed antibiotic course and reports clinical improvement - Recent tests for COVID, influenza, and RSV were negative  Chronic obstructive pulmonary disease (copd) - Managed with Trelegy, rescue inhaler, nebulizer treatments, and home oxygen  - Home oxygen  at 4 L at rest, increased to 5 L with exertion   Relevant pulmonary history Pets: No pets  Occupation: Homemaker Exposures: No known exposures.  Reports a mildew problem in the basement but she hardly goes there Smoking history: 25-pack-year smoker.  Quit in 1982 Travel history: No significant travel Relevant family history: Father had emphysema  Outpatient Encounter Medications as of 04/15/2024  Medication Sig   acetaminophen  (TYLENOL ) 325 MG tablet Take 2 tablets (650 mg total) by mouth every 6 (six) hours as needed for mild pain (or Fever >/= 101).   albuterol  (VENTOLIN  HFA) 108 (90 Base) MCG/ACT inhaler Inhale 2 puffs into the lungs 3 (three) times daily for 3 days, THEN 2 puffs every 4 (four) hours as needed for wheezing or shortness of breath.   ALPRAZolam  (XANAX ) 0.25 MG tablet Take 1 tablet (0.25 mg total) by mouth 2 (two) times daily as needed.   amLODipine -valsartan  (EXFORGE ) 10-320 MG tablet Take 1 tablet by mouth daily.   aspirin  EC 81 MG tablet Take 1 tablet (81 mg total) by mouth daily. Swallow whole.   Cholecalciferol (VITAMIN D3) 50 MCG (2000 UT) capsule Take 2,000 Units by mouth daily.   cloNIDine  (CATAPRES ) 0.2 MG tablet TAKE 1 TABLET BY MOUTH TWICE  DAILY   furosemide  (LASIX ) 40 MG tablet Take 1 tablet (40 mg total) by mouth daily.   gabapentin  (NEURONTIN ) 600  MG tablet TAKE 1 TABLET BY MOUTH 3 TIMES  DAILY   guaiFENesin  (MUCINEX ) 600 MG 12 hr tablet Take 1 tablet (600 mg total) by mouth 2 (two) times daily.   hydrALAZINE  (APRESOLINE ) 100 MG tablet TAKE 1 TABLET BY MOUTH 3 TIMES  DAILY   ipratropium-albuterol  (DUONEB) 0.5-2.5 (3) MG/3ML SOLN Take 3 mLs by nebulization every 4 (four) hours as needed (wheezing or  shortness of breath).   iron  polysaccharides (NIFEREX) 150 MG capsule TAKE 1 CAPSULE BY MOUTH EVERY DAY   levofloxacin  (LEVAQUIN ) 500 MG tablet Take 1 tablet (500 mg total) by mouth daily.   lovastatin  (MEVACOR ) 40 MG tablet TAKE 1 TABLET BY MOUTH AT  BEDTIME   meclizine  (ANTIVERT ) 12.5 MG tablet Take 1 tablet (12.5 mg total) by mouth 3 (three) times daily as needed.   OXYGEN  Inhale 4 L/min into the lungs continuous.   pantoprazole  (PROTONIX ) 40 MG tablet TAKE 1 TABLET BY MOUTH DAILY   polyethylene glycol (MIRALAX  / GLYCOLAX ) 17 g packet Take 17 g by mouth daily as needed for mild constipation or moderate constipation.   potassium chloride  SA (KLOR-CON  M20) 20 MEQ tablet Take 1 tablet (20 mEq total) by mouth daily.   traMADol  (ULTRAM ) 50 MG tablet Take 1 tablet (50 mg total) by mouth every 6 (six) hours as needed. for pain   traZODone  (DESYREL ) 50 MG tablet TAKE 1/2 TO 1 TABLET BY MOUTH AT BEDTIME AS NEEDED FOR SLEEP   TRELEGY ELLIPTA  200-62.5-25 MCG/ACT AEPB USE 1 INHALATION BY MOUTH ONCE  DAILY AT THE SAME TIME EACH DAY  (DUE FOR APPT, CALL OFFICE)   triamcinolone  cream (KENALOG ) 0.1 % Apply topically 2 (two) times daily.   amoxicillin -clavulanate (AUGMENTIN ) 875-125 MG tablet Take 1 tablet by mouth 2 (two) times daily. (Patient not taking: Reported on 04/15/2024)   fluticasone  (FLONASE ) 50 MCG/ACT nasal spray Place 2 sprays into both nostrils daily for 7 days. (Patient not taking: Reported on 04/15/2024)   loratadine  (CLARITIN ) 10 MG tablet Take 1 tablet (10 mg total) by mouth daily. (Patient not taking: Reported on 04/15/2024)   nystatin  (MYCOSTATIN ) 100000 UNIT/ML suspension Swish and spit 5mL two times daily after using Symbicort . (Patient not taking: Reported on 04/15/2024)   solifenacin  (VESICARE ) 10 MG tablet TAKE 1 TABLET BY MOUTH EVERY DAY (Patient not taking: Reported on 04/15/2024)   tiZANidine  (ZANAFLEX ) 4 MG tablet Take 1 tablet (4 mg total) by mouth every 6 (six) hours as needed for  muscle spasms. (Patient not taking: Reported on 04/15/2024)   Turmeric (QC TUMERIC COMPLEX PO) Take 1 tablet by mouth daily. (Patient not taking: Reported on 04/15/2024)   No facility-administered encounter medications on file as of 04/15/2024.   Vitals:   04/15/24 1320  Pulse: (!) 56  Height: 5' 4 (1.626 m)  Weight: 193 lb (87.5 kg)  SpO2: 98% Comment: 4L Home concentrator  BMI (Calculated): 33.11    Physical Exam Tele   Data Reviewed: Imaging CT chest 10/17/2008- centrilobular emphysema in the upper lobes, 2 mm nodule in the right lung.  Chest x-ray 06/12/2021-no active cardiopulmonary disease CT chest 02/15/2023-radiology read is pending.  By my review it appears stable with emphysema, stable scarring/atelectasis at the base Chest x-ray 03/03/2024-cardiomegaly with bibasilar infiltrates, pulm edema Chest x-ray 03/06/2024-improved aeration of the lungs with basilar atelectasis Chest x-ray 03/30/2024-Bibasal airspace opacities I have reviewed the images personally  PFTs  12/21/2017 FVC 1.75 [9%), FEV1 1.10 [73%], F/F 63, TLC 94%, DLCO 41% Moderate obstruction, severe diffusion defect.  6-minute walk test 02/04/2018 96 m, Patient had to stop for desats.  Titrated to 3 L oxygen   Labs CBC 02/04/2018-WBC 7.4, eos 2.5%, absolute eosinophil count 185 Alpha-1 antitrypsin 12/05/2017-159, PI MM IgE 12/05/2017-224  Cardiac: Echocardiogram 01/24/2021 LVEF 60 to 65%, mild concentric LVH Severely elevated PA systolic pressure, RVSP 66  Sleep: Sleep study 04/28/2021 No OSA, AHI 0.  Noted oxygen  desaturation.  Assessment & Plan Pneumonia Recent hospitalization for pneumonia confirmed by chest x-ray. Treated with IV Solu-Medrol , antibiotics, and nebulizers. COVID, flu, and RSV tests were negative. Currently back to normal with no ongoing symptoms. - Ordered follow-up chest x-ray after the new year to ensure resolution of pneumonia.  Chronic obstructive pulmonary disease with chronic  respiratory failure COPD with chronic respiratory failure, managed with Trelegy inhaler, nebulizers, and supplemental oxygen . Recent exacerbation likely contributed to pneumonia. Blood pressure previously elevated to 200 during hospitalization. Continues on 4 liters of oxygen , with an increase to 5 liters during straining. - Continue Trelegy inhaler. - Continue nebulizer treatments. - Maintain supplemental oxygen  at 4 liters, increase to 5 liters during straining.  Health maintenance 12/03/2013-Prevnar 12/08/2018-Pneumovax  Plan/Recommendations: - Continue Trelegy - Continue supplemental oxygen  - Add DuoNebs - Physical therapy - Follow up CXR   Lonna Coder MD [facility] 04/15/2024, 1:29 PM  CC: Wendy Torres ORN, MD "

## 2024-04-15 NOTE — Patient Instructions (Signed)
°  VISIT SUMMARY: You had a follow-up visit after your recent hospitalization for pneumonia. Your pneumonia has improved, and you are back to normal with no ongoing symptoms. We also reviewed your COPD and hypertension management.  YOUR PLAN: PNEUMONIA: You were recently hospitalized for pneumonia, which was confirmed by a chest x-ray. You received treatment and have completed your antibiotics. -We will order a follow-up chest x-ray after the new year to ensure the pneumonia has fully resolved.  CHRONIC OBSTRUCTIVE PULMONARY DISEASE (COPD) WITH CHRONIC RESPIRATORY FAILURE: Your COPD is managed with an inhaler, nebulizer treatments, and supplemental oxygen . You recently had an exacerbation that likely contributed to your pneumonia. -Continue using your Trelegy inhaler as prescribed. -Continue your nebulizer treatments as needed. -Maintain your supplemental oxygen  at 4 liters when at rest and increase to 5 liters during exertion.

## 2024-04-18 ENCOUNTER — Encounter: Payer: Self-pay | Admitting: Internal Medicine

## 2024-04-18 MED ORDER — LORATADINE 10 MG PO TABS: 10.0000 mg | ORAL_TABLET | Freq: Every day | ORAL | 1 refills | Status: AC

## 2024-04-19 ENCOUNTER — Emergency Department (HOSPITAL_COMMUNITY)

## 2024-04-19 ENCOUNTER — Encounter (HOSPITAL_COMMUNITY): Payer: Self-pay

## 2024-04-19 ENCOUNTER — Other Ambulatory Visit: Payer: Self-pay

## 2024-04-19 ENCOUNTER — Emergency Department (HOSPITAL_COMMUNITY)
Admission: EM | Admit: 2024-04-19 | Discharge: 2024-04-20 | Disposition: A | Attending: Emergency Medicine | Admitting: Emergency Medicine

## 2024-04-19 DIAGNOSIS — R339 Retention of urine, unspecified: Secondary | ICD-10-CM | POA: Insufficient documentation

## 2024-04-19 DIAGNOSIS — Z79899 Other long term (current) drug therapy: Secondary | ICD-10-CM | POA: Insufficient documentation

## 2024-04-19 DIAGNOSIS — Z7982 Long term (current) use of aspirin: Secondary | ICD-10-CM | POA: Diagnosis not present

## 2024-04-19 DIAGNOSIS — I110 Hypertensive heart disease with heart failure: Secondary | ICD-10-CM | POA: Diagnosis not present

## 2024-04-19 DIAGNOSIS — J4489 Other specified chronic obstructive pulmonary disease: Secondary | ICD-10-CM | POA: Insufficient documentation

## 2024-04-19 DIAGNOSIS — R531 Weakness: Secondary | ICD-10-CM | POA: Diagnosis not present

## 2024-04-19 DIAGNOSIS — I509 Heart failure, unspecified: Secondary | ICD-10-CM | POA: Diagnosis not present

## 2024-04-19 DIAGNOSIS — R0602 Shortness of breath: Secondary | ICD-10-CM | POA: Insufficient documentation

## 2024-04-19 NOTE — ED Notes (Signed)
 Patient transported to X-ray

## 2024-04-19 NOTE — ED Triage Notes (Signed)
 Patient bib [facility] from home. Family orginally called for shob, sats 88% on EMS arrival to scene . She is on  3L chronically.  Clear lungs with EMS, recent pneumonia has completed antibiotics.    Increased weakness on right side, has been complaining of pain in right wrist. Stroke screen only slightly weak in right arm with EMS. LKN was over 24hrs ago.    PMH: COPD, CHF, HTN

## 2024-04-19 NOTE — ED Notes (Signed)
 Patient on 4L [place] at baseline at home, and increased to 5L with ambulation.

## 2024-04-20 ENCOUNTER — Encounter (HOSPITAL_COMMUNITY): Payer: Self-pay

## 2024-04-20 ENCOUNTER — Emergency Department (HOSPITAL_COMMUNITY)

## 2024-04-20 LAB — URINALYSIS, W/ REFLEX TO CULTURE (INFECTION SUSPECTED)
Bacteria, UA: NONE SEEN
Bilirubin Urine: NEGATIVE
Glucose, UA: NEGATIVE mg/dL
Hgb urine dipstick: NEGATIVE
Ketones, ur: NEGATIVE mg/dL
Leukocytes,Ua: NEGATIVE
Nitrite: NEGATIVE
Protein, ur: NEGATIVE mg/dL
Specific Gravity, Urine: <1.005 — ABNORMAL LOW (ref 1.005–1.030)
pH: 6.5 (ref 5.0–8.0)

## 2024-04-20 LAB — I-STAT VENOUS BLOOD GAS, ED
Acid-Base Excess: 10 mmol/L — ABNORMAL HIGH (ref 0.0–2.0)
Bicarbonate: 36.7 mmol/L — ABNORMAL HIGH (ref 20.0–28.0)
Calcium, Ion: 1.37 mmol/L (ref 1.15–1.40)
HCT: 34 % — ABNORMAL LOW (ref 36.0–46.0)
Hemoglobin: 11.6 g/dL — ABNORMAL LOW (ref 12.0–15.0)
O2 Saturation: 97 %
Potassium: 4.7 mmol/L (ref 3.5–5.1)
Sodium: 134 mmol/L — ABNORMAL LOW (ref 135–145)
TCO2: 39 mmol/L — ABNORMAL HIGH (ref 22–32)
pCO2, Ven: 59.6 mmHg (ref 44–60)
pH, Ven: 7.398 (ref 7.25–7.43)
pO2, Ven: 91 mmHg — ABNORMAL HIGH (ref 32–45)

## 2024-04-20 LAB — CBC
HCT: 32.7 % — ABNORMAL LOW (ref 36.0–46.0)
Hemoglobin: 10.3 g/dL — ABNORMAL LOW (ref 12.0–15.0)
MCH: 26.4 pg (ref 26.0–34.0)
MCHC: 31.5 g/dL (ref 30.0–36.0)
MCV: 83.8 fL (ref 80.0–100.0)
Platelets: 247 K/uL (ref 150–400)
RBC: 3.9 MIL/uL (ref 3.87–5.11)
RDW: 15.8 % — ABNORMAL HIGH (ref 11.5–15.5)
WBC: 11.5 K/uL — ABNORMAL HIGH (ref 4.0–10.5)
nRBC: 0 % (ref 0.0–0.2)

## 2024-04-20 LAB — COMPREHENSIVE METABOLIC PANEL WITH GFR
ALT: 12 U/L (ref 0–44)
AST: 17 U/L (ref 15–41)
Albumin: 3.7 g/dL (ref 3.5–5.0)
Alkaline Phosphatase: 54 U/L (ref 38–126)
Anion gap: 7 (ref 5–15)
BUN: 20 mg/dL (ref 8–23)
CO2: 33 mmol/L — ABNORMAL HIGH (ref 22–32)
Calcium: 11.1 mg/dL — ABNORMAL HIGH (ref 8.9–10.3)
Chloride: 96 mmol/L — ABNORMAL LOW (ref 98–111)
Creatinine, Ser: 1.27 mg/dL — ABNORMAL HIGH (ref 0.44–1.00)
GFR, Estimated: 41 mL/min — ABNORMAL LOW
Glucose, Bld: 114 mg/dL — ABNORMAL HIGH (ref 70–99)
Potassium: 4.3 mmol/L (ref 3.5–5.1)
Sodium: 137 mmol/L (ref 135–145)
Total Bilirubin: 0.3 mg/dL (ref 0.0–1.2)
Total Protein: 7 g/dL (ref 6.5–8.1)

## 2024-04-20 LAB — AMMONIA: Ammonia: <13 umol/L (ref 9–35)

## 2024-04-20 LAB — RESP PANEL BY RT-PCR (RSV, FLU A&B, COVID)  RVPGX2
Influenza A by PCR: NEGATIVE
Influenza B by PCR: NEGATIVE
Resp Syncytial Virus by PCR: NEGATIVE
SARS Coronavirus 2 by RT PCR: NEGATIVE

## 2024-04-20 LAB — MAGNESIUM: Magnesium: 1.8 mg/dL (ref 1.7–2.4)

## 2024-04-20 LAB — PRO BRAIN NATRIURETIC PEPTIDE: Pro Brain Natriuretic Peptide: 183 pg/mL

## 2024-04-20 MED ORDER — IPRATROPIUM-ALBUTEROL 0.5-2.5 (3) MG/3ML IN SOLN
3.0000 mL | Freq: Once | RESPIRATORY_TRACT | Status: AC
  Administered 2024-04-20: 3 mL via RESPIRATORY_TRACT
  Filled 2024-04-20: qty 3

## 2024-04-20 MED ORDER — SODIUM CHLORIDE 0.9 % IV BOLUS
1000.0000 mL | Freq: Once | INTRAVENOUS | Status: AC
  Administered 2024-04-20: 1000 mL via INTRAVENOUS

## 2024-04-20 NOTE — ED Notes (Signed)
 EDP at [facility]

## 2024-04-20 NOTE — Discharge Instructions (Addendum)
 Your workup here in the emergency department was reassuring.  We obtained a CT head and MRI which did not show any acute strokes as we discussed.  Your blood work is also reassuring without any significant electrolyte abnormalities or renal injury.  There was slight evidence of dehydration.  Your urine was negative for infection.  Your chest x-ray was negative for infection.

## 2024-04-20 NOTE — ED Notes (Signed)
IV team in room at this time.  

## 2024-04-20 NOTE — ED Provider Notes (Signed)
 Patient was initially seen by Dr. Trine.  Please see his note.  Patient is otherwise stable for discharge however during her ED visit she was noted to have urinary retention.  Plan was to repeat bladder scan to assess for recurrent urinary retention and the need for possible Foley catheter.  Patient had repeat bladder scan this morning.  It only shows 52 mL.  Would not expect her to need to urinate at this point.  Patient has been able to have a bowel movement.  She is able to get up.  Patient and daughter are comfortable with discharge   Randol Simmonds, MD 04/20/24 [phone]

## 2024-04-20 NOTE — ED Notes (Signed)
 MD at beside updating family and patient at this time.

## 2024-04-20 NOTE — ED Notes (Signed)
 Bladder scan 681

## 2024-04-20 NOTE — ED Notes (Signed)
 Patient transported to MRI

## 2024-04-20 NOTE — ED Notes (Signed)
 Bladder scanned this pt.; bladder scan showed ; EDP notified

## 2024-04-20 NOTE — ED Provider Notes (Signed)
 "  EMERGENCY DEPARTMENT AT [facility] Provider Note  CSN: [id number] Arrival date & time: 04/19/24 2230  Chief Complaint(s) Shortness of Breath and Weakness  History provided by patient and family. HPI & MDM Wendy Torres is a 83 y.o. female with a past medical history listed below including diastolic heart failure, COPD on 4 L nasal cannula at home who presented to the emergency department with 1 day of generalized weakness right side worse than left per family.  Patient has also been more lethargic and mumbling words.  She complained of shortness of breath.  No chest pain.  No nausea or vomiting.  No abdominal pain.  No urinary symptoms.   Shortness of Breath Weakness Associated symptoms: shortness of breath      Medical Decision Making Amount and/or Complexity of Data Reviewed Labs: ordered. Decision-making details documented in ED Course. Radiology: ordered and independent interpretation performed. Decision-making details documented in ED Course. ECG/medicine tests: ordered and independent interpretation performed. Decision-making details documented in ED Course.  Risk Prescription drug management. Decision regarding hospitalization.  Patient Shortness of breath differential diagnosis considered. Patient treated for COPD exacerbation and work of breathing improved with 1 DuoNeb.  Chest x-ray without evidence of pneumonia, pneumothorax, pulmonary edema pleural effusions.  BNP was normal. No unilateral leg swelling no prolonged immobilization concerning for PE. Respiratory viral panel was negative for COVID, influenza, RSV.  Generalized weakness, right greater than left Exam was notable for right hand weakness but this may be due to arthritis from the right wrist given her pain. X-ray without any acute fractures but did show arthritis. CT head was negative for ICH or remote strokes. MRI was negative for acute stroke. VBG without hypercarbia. Ammonia  level was normal No significant electrolyte derangements.  Mild renal insufficiency without overt AKI. UA without evidence of infection.  Patient did have to be cath for urine.  Plan to void challenge patient to ensure she is able to void on her own episode.  She is stable to go home without Foley catheter.  If patient continues to retain.  She will need a Foley catheter.   Final Clinical Impression(s) / ED Diagnoses Final diagnoses:  Generalized weakness   The patient appears reasonably screened and/or stabilized for discharge and I doubt any other medical condition or other [facility] requiring further screening, evaluation, or treatment in the ED at this time. I have discussed the findings, Dx and Tx plan with the patient/family who expressed understanding and agree(s) with the plan. Discharge instructions discussed at length. The patient/family was given strict return precautions who verbalized understanding of the instructions. No further questions at time of discharge.  Disposition: Discharge  Condition: Good  ED Discharge Orders     None       Follow Up: Wendy Lynwood ORN, MD [address] [place] [address] [phone]  Call  to schedule an appointment for close follow up     Past Medical History Past Medical History:  Diagnosis Date   ALLERGIC RHINITIS 12/09/2006   ANEMIA 01/14/2009   Anxiety    Arthritis    ASTHMA 12/09/2006   Cataract    bilateral -removed   CHF (congestive heart failure) (HCC)    Chronic pain syndrome 02/16/2009   COPD 12/09/2006   DEGENERATIVE JOINT DISEASE 12/09/2006   DIZZINESS 03/30/2010   DYSPNEA 01/27/2010   with heavy exertion   FREQUENCY, URINARY 03/30/2010   GERD 12/09/2006   HELICOBACTER PYLORI GASTRITIS, HX OF 12/09/2006  HIATAL HERNIA 03/01/2010   HYPERLIPIDEMIA 04/18/2007   HYPERSOMNIA 08/31/2008   HYPERTENSION 12/09/2006   INSOMNIA-SLEEP DISORDER-UNSPEC 11/05/2008   LOW BACK PAIN 12/09/2006   Morbid obesity (HCC)  12/09/2006   Nonspecific (abnormal) findings on radiological and other examination of body structure 11/05/2008   OSTEOPOROSIS 12/09/2006   OVERACTIVE BLADDER 04/14/2008   PALPITATIONS, RECURRENT 01/27/2010   Stroke (HCC)    pt. stated light one years ago   SYNCOPE 11/05/2008   TRANSIENT ISCHEMIC ATTACK, HX OF 12/09/2006   Patient Active Problem List   Diagnosis Date Noted   CAP (community acquired pneumonia) 03/31/2024   COPD exacerbation (HCC) 03/31/2024   Acute exacerbation of chronic obstructive pulmonary disease (COPD) (HCC) 03/30/2024   Acute on chronic hypoxic respiratory failure (HCC) 03/03/2024   Acute on chronic diastolic congestive heart failure (HCC) 03/03/2024   Hoarseness 11/21/2023   Encounter for well adult exam with abnormal findings 10/28/2023   PHN (postherpetic neuralgia) 10/28/2023   Shingles outbreak 09/29/2023   Productive cough 04/30/2023   Cervicalgia 02/23/2023   Persistent headaches 02/23/2023   Cerebral vascular disease 02/23/2023   Hypercalcemia 02/05/2023   Right lumbar radiculopathy 02/03/2023   Solitary pulmonary nodule 02/03/2023   Occult blood in stools 01/18/2023   AVM (arteriovenous malformation) of small bowel, acquired 01/18/2023   COPD with acute exacerbation (HCC) 01/15/2023   Incidental pulmonary nodule 01/15/2023   Acute on chronic heart failure with preserved ejection fraction (HFpEF) (HCC) 01/15/2023   Bilateral hearing loss 11/11/2022   Left ear hearing loss 12/27/2021   Vitamin D  deficiency 11/02/2021   Carpal tunnel syndrome of right wrist 07/14/2021   Gastritis 12/01/2020   Vertigo 12/01/2020   Hemorrhoid 12/01/2020   Chronic respiratory failure with hypoxia (HCC) 10/18/2020   Anxiety 05/16/2020   Chronic obstructive pulmonary disease (HCC) 02/17/2020   Pain and swelling of right knee 10/15/2019   Pain in left knee 11/10/2017   Prediabetes 10/03/2017   Congestive heart failure (CHF) (HCC) 10/05/2016   Elevated total  protein 05/31/2016   Tinnitus 05/05/2015   Overactive bladder 11/12/2012   OA (osteoarthritis) of hip 11/06/2012   Dysphagia 10/23/2012   Osteoporosis 05/15/2012   DJD (degenerative joint disease) of knee 02/15/2012   Olecranon bursitis of right elbow 11/23/2011   Cervical radiculopathy 09/30/2011   Gait abnormality 09/04/2011   HIATAL HERNIA 03/01/2010   Chronic pain syndrome 02/16/2009   Iron  deficiency anemia 01/14/2009   Insomnia 11/05/2008   OVERACTIVE BLADDER 04/14/2008   Dyslipidemia, goal LDL below 130 04/18/2007   Morbid obesity (HCC) 12/09/2006   Essential hypertension 12/09/2006   COPD with chronic bronchitis and emphysema (HCC) 12/09/2006   GERD 12/09/2006   Osteoarthritis 12/09/2006   Home Medication(s) Prior to Admission medications  Medication Sig Start Date End Date Taking? Authorizing Provider  acetaminophen  (TYLENOL ) 325 MG tablet Take 2 tablets (650 mg total) by mouth every 6 (six) hours as needed for mild pain (or Fever >/= 101). 02/19/14   Perkins, Alexzandrew L, PA-C  albuterol  (VENTOLIN  HFA) 108 (90 Base) MCG/ACT inhaler Inhale 2 puffs into the lungs 3 (three) times daily for 3 days, THEN 2 puffs every 4 (four) hours as needed for wheezing or shortness of breath. 03/07/24 03/10/25  Sebastian Toribio GAILS, MD  ALPRAZolam  (XANAX ) 0.25 MG tablet Take 1 tablet (0.25 mg total) by mouth 2 (two) times daily as needed. 11/05/23   Wendy Lynwood ORN, MD  amLODipine -valsartan  (EXFORGE ) 10-320 MG tablet Take 1 tablet by mouth daily. 12/13/23   Walker, Caitlin  S, NP  amoxicillin -clavulanate (AUGMENTIN ) 875-125 MG tablet Take 1 tablet by mouth 2 (two) times daily. Patient not taking: Reported on 04/15/2024 04/01/24   Arlon Carliss ORN, DO  aspirin  EC 81 MG tablet Take 1 tablet (81 mg total) by mouth daily. Swallow whole. 01/22/23   Tobie Yetta HERO, MD  Cholecalciferol (VITAMIN D3) 50 MCG (2000 UT) capsule Take 2,000 Units by mouth daily.    [provider]  cloNIDine  (CATAPRES )  0.2 MG tablet TAKE 1 TABLET BY MOUTH TWICE  DAILY 01/28/24   Wendy Lynwood ORN, MD  fluticasone  (FLONASE ) 50 MCG/ACT nasal spray Place 2 sprays into both nostrils daily for 7 days. Patient not taking: Reported on 04/15/2024 03/08/24 03/31/24  Sebastian Toribio GAILS, MD  furosemide  (LASIX ) 40 MG tablet Take 1 tablet (40 mg total) by mouth daily. 01/21/24   Tobb, Kardie, DO  gabapentin  (NEURONTIN ) 600 MG tablet TAKE 1 TABLET BY MOUTH 3 TIMES  DAILY 01/28/24   Wendy Lynwood ORN, MD  guaiFENesin  (MUCINEX ) 600 MG 12 hr tablet Take 1 tablet (600 mg total) by mouth 2 (two) times daily. 01/19/23   Tobie Yetta HERO, MD  hydrALAZINE  (APRESOLINE ) 100 MG tablet TAKE 1 TABLET BY MOUTH 3 TIMES  DAILY 10/29/23   Wendy Lynwood ORN, MD  ipratropium-albuterol  (DUONEB) 0.5-2.5 (3) MG/3ML SOLN Take 3 mLs by nebulization every 4 (four) hours as needed (wheezing or shortness of breath). 03/24/24   Mannam, Praveen, MD  iron  polysaccharides (NIFEREX) 150 MG capsule TAKE 1 CAPSULE BY MOUTH EVERY DAY 11/12/23   Wendy Lynwood ORN, MD  levofloxacin  (LEVAQUIN ) 500 MG tablet Take 1 tablet (500 mg total) by mouth daily. 04/03/24   Wendy Lynwood ORN, MD  loratadine  (CLARITIN ) 10 MG tablet Take 1 tablet (10 mg total) by mouth daily. 04/18/24   Wendy Lynwood ORN, MD  lovastatin  (MEVACOR ) 40 MG tablet TAKE 1 TABLET BY MOUTH AT  BEDTIME 11/26/23   Wendy Lynwood ORN, MD  meclizine  (ANTIVERT ) 12.5 MG tablet Take 1 tablet (12.5 mg total) by mouth 3 (three) times daily as needed. 03/20/24 03/20/25  Wendy Lynwood ORN, MD  nystatin  (MYCOSTATIN ) 100000 UNIT/ML suspension Swish and spit 5mL two times daily after using Symbicort . Patient not taking: Reported on 04/15/2024 11/09/22   Wendy Lynwood ORN, MD  OXYGEN  Inhale 4 L/min into the lungs continuous.    [provider]  pantoprazole  (PROTONIX ) 40 MG tablet TAKE 1 TABLET BY MOUTH DAILY 11/15/23   Wendy Lynwood ORN, MD  polyethylene glycol (MIRALAX  / GLYCOLAX ) 17 g packet Take 17 g by mouth daily as needed for mild constipation or moderate  constipation.    [provider]  potassium chloride  SA (KLOR-CON  M20) 20 MEQ tablet Take 1 tablet (20 mEq total) by mouth daily. 01/21/24   Tobb, Kardie, DO  solifenacin  (VESICARE ) 10 MG tablet TAKE 1 TABLET BY MOUTH EVERY DAY Patient not taking: Reported on 04/15/2024 10/29/23   Wendy Lynwood ORN, MD  tiZANidine  (ZANAFLEX ) 4 MG tablet Take 1 tablet (4 mg total) by mouth every 6 (six) hours as needed for muscle spasms. Patient not taking: Reported on 04/15/2024 03/20/24   Wendy Lynwood ORN, MD  traMADol  (ULTRAM ) 50 MG tablet Take 1 tablet (50 mg total) by mouth every 6 (six) hours as needed. for pain 03/20/24   Wendy Lynwood ORN, MD  traZODone  (DESYREL ) 50 MG tablet TAKE 1/2 TO 1 TABLET BY MOUTH AT BEDTIME AS NEEDED FOR SLEEP 11/05/23   Wendy Lynwood ORN, MD  TRELEGY ELLIPTA  200-62.5-25 MCG/ACT  AEPB USE 1 INHALATION BY MOUTH ONCE  DAILY AT THE SAME TIME EACH DAY  (DUE FOR APPT, CALL OFFICE) 04/15/24   Mannam, Praveen, MD  triamcinolone  cream (KENALOG ) 0.1 % Apply topically 2 (two) times daily. 11/05/23   Wendy Lynwood ORN, MD  Turmeric (QC TUMERIC COMPLEX PO) Take 1 tablet by mouth daily. Patient not taking: Reported on 04/15/2024    [provider]                                                                                                                                    Allergies Benazepril  and Chlorthalidone  Review of Systems Review of Systems  Respiratory:  Positive for shortness of breath.   Neurological:  Positive for weakness.   As noted in HPI  Physical Exam Vital Signs  I have reviewed the triage vital signs BP (!) 162/85 (BP Location: Right Arm)   Pulse 61   Temp 98.2 F (36.8 C)   Resp 16   SpO2 94%   Physical Exam Vitals reviewed.  Constitutional:      General: She is not in acute distress.    Appearance: She is well-developed. She is not diaphoretic.  HENT:     Head: Normocephalic and atraumatic.     Nose: Nose normal.  Eyes:     General: No scleral icterus.        Right eye: No discharge.        Left eye: No discharge.     Conjunctiva/sclera: Conjunctivae normal.     Pupils: Pupils are equal, round, and reactive to light.  Cardiovascular:     Rate and Rhythm: Normal rate and regular rhythm.     Heart sounds: No murmur heard.    No friction rub. No gallop.  Pulmonary:     Effort: Pulmonary effort is normal. No respiratory distress.     Breath sounds: Normal breath sounds. No stridor. No rales.  Abdominal:     General: There is no distension.     Palpations: Abdomen is soft.     Tenderness: There is no abdominal tenderness.  Musculoskeletal:        General: No tenderness.     Cervical back: Normal range of motion and neck supple.  Skin:    General: Skin is warm and dry.     Findings: No erythema or rash.  Neurological:     Mental Status: She is oriented to person, place, and time. She is lethargic.     Comments: Mental Status:  Alert, but sleepy and oriented to person, place, and time.  Attention and concentration delayed  Speech dysarthric.   Cranial Nerves:  II Visual Fields: Intact to confrontation. Visual fields intact. III, IV, VI: Pupils equal and reactive to light and near. Full eye movement without nystagmus  V Facial Sensation: Normal. No weakness of masticatory muscles  VII: No facial weakness or asymmetry  VIII Auditory Acuity:  Grossly normal  IX/X: The uvula is midline; the palate elevates symmetrically  XI: Normal sternocleidomastoid and trapezius strength  XII: The tongue is midline. No atrophy or fasciculations.   Motor System: Muscle Strength: 5/5 and symmetric in the upper and lower extremities; right wrist strength limited by wrist pain. No pronation or drift.  Muscle Tone: Tone and muscle bulk are normal in the upper and lower extremities.  Reflexes:  No Clonus Coordination:  No tremor.  Sensation: Intact to light touch. Gait: deferred      ED Results and Treatments Labs (all labs ordered are listed, but  only abnormal results are displayed) Labs Reviewed  CBC - Abnormal; Notable for the following components:      Result Value   WBC 11.5 (*)    Hemoglobin 10.3 (*)    HCT 32.7 (*)    RDW 15.8 (*)    All other components within normal limits  URINALYSIS, W/ REFLEX TO CULTURE (INFECTION SUSPECTED) - Abnormal; Notable for the following components:   Color, Urine STRAW (*)    Specific Gravity, Urine <1.005 (*)    All other components within normal limits  COMPREHENSIVE METABOLIC PANEL WITH GFR - Abnormal; Notable for the following components:   Chloride 96 (*)    CO2 33 (*)    Glucose, Bld 114 (*)    Creatinine, Ser 1.27 (*)    Calcium 11.1 (*)    GFR, Estimated 41 (*)    All other components within normal limits  I-STAT VENOUS BLOOD GAS, ED - Abnormal; Notable for the following components:   pO2, Ven 91 (*)    Bicarbonate 36.7 (*)    TCO2 39 (*)    Acid-Base Excess 10.0 (*)    Sodium 134 (*)    HCT 34.0 (*)    Hemoglobin 11.6 (*)    All other components within normal limits  RESP PANEL BY RT-PCR (RSV, FLU A&B, COVID)  RVPGX2  PRO BRAIN NATRIURETIC PEPTIDE  MAGNESIUM  AMMONIA                                                                                                                         EKG  EKG Interpretation Date/Time:  Saturday April 19 2024 22:40:37 EST Ventricular Rate:  81 PR Interval:  153 QRS Duration:  92 QT Interval:  361 QTC Calculation: 419 R Axis:   34  Text Interpretation: Sinus rhythm Confirmed by Trine Likes [phone]) on 04/20/2024 4:26:17 AM       Radiology MR BRAIN WO CONTRAST Result Date: 04/20/2024 EXAM: MRI BRAIN WITHOUT CONTRAST 04/20/2024 06:03:27 AM TECHNIQUE: Multiplanar multisequence MRI of the head/brain was performed without the administration of intravenous contrast. COMPARISON: Comparison study CT of the head dated 04/20/2024 and MRI of the head dated 03/16/2023. CLINICAL HISTORY: Neuro deficit, acute, stroke suspected. FINDINGS:  BRAIN AND VENTRICLES: No acute infarct. No intracranial hemorrhage. No mass. No midline shift. No hydrocephalus. Age-related atrophy. Advanced diffuse cerebral white matter disease. There is  an empty sella. Normal flow voids. ORBITS: The patient is status post bilateral lens replacement. No acute abnormality. SINUSES AND MASTOIDS: No acute abnormality. BONES AND SOFT TISSUES: Normal marrow signal. No acute soft tissue abnormality. IMPRESSION: 1. No acute intracranial abnormality. 2. Age-related atrophy and advanced diffuse cerebral white matter disease. Empty sella. Electronically signed by: Evalene Coho MD 04/20/2024 06:07 AM EST RP Workstation: [id number]   DG Wrist Complete Right Result Date: 04/20/2024 EXAM: 3 or more VIEW(S) XRAY OF THE RIGHT WRIST 04/20/2024 05:03:08 AM COMPARISON: None available. CLINICAL HISTORY: pain FINDINGS: BONES AND JOINTS: The bones are osteopenic. There is mild degenerative cyst formation within the hamate. There is mild osteoarthritis of the first carpometacarpal joint. No acute fracture. No malalignment. SOFT TISSUES: There is mild soft tissue swelling. IMPRESSION: 1. Mild soft tissue swelling. 2. Osteopenia with mild degenerative cyst formation in the hamate and mild osteoarthritis of the first carpometacarpal joint. Electronically signed by: Evalene Coho MD 04/20/2024 05:13 AM EST RP Workstation: [id number]   CT Head Wo Contrast Result Date: 04/20/2024 EXAM: CT HEAD WITHOUT CONTRAST 04/20/2024 01:07:07 AM TECHNIQUE: CT of the head was performed without the administration of intravenous contrast. Automated exposure control, iterative reconstruction, and/or weight based adjustment of the mA/kV was utilized to reduce the radiation dose to as low as reasonably achievable. COMPARISON: Comparison with 11/25/2023. CLINICAL HISTORY: Neuro deficit, acute, stroke suspected. FINDINGS: BRAIN AND VENTRICLES: No acute hemorrhage. No evidence of acute infarct. No hydrocephalus.  No extra-axial collection. No mass effect or midline shift. Chronic white matter ischemic change. Mild cerebral atrophy. ORBITS: No acute abnormality. SINUSES: No acute abnormality. SOFT TISSUES AND SKULL: No acute soft tissue abnormality. No skull fracture. IMPRESSION: 1. No acute intracranial abnormality. Electronically signed by: Norman Gatlin MD 04/20/2024 01:13 AM EST RP Workstation: [id number]   DG Chest 2 View Result Date: 04/19/2024 EXAM: 2 VIEW(S) XRAY OF THE CHEST 04/19/2024 11:27:47 PM COMPARISON: 03/30/2024 CLINICAL HISTORY: FINDINGS: LUNGS AND PLEURA: Interstitial and airspace opacities in the lower lungs are similar to the prior. No large pleural effusions. No pneumothorax. HEART AND MEDIASTINUM: Cardiomegaly. BONES AND SOFT TISSUES: No acute osseous abnormality. IMPRESSION: 1. Basilar airspace and interstitial opacities favoring atelectasis. Infiltrates are not excluded. Electronically signed by: Norman Gatlin MD 04/19/2024 11:36 PM EST RP Workstation: [id number]    Medications Ordered in ED Medications  ipratropium-albuterol  (DUONEB) 0.5-2.5 (3) MG/3ML nebulizer solution 3 mL (3 mLs Nebulization Given 04/20/24 0204)  sodium chloride  0.9 % bolus 1,000 mL (1,000 mLs Intravenous New Bag/Given 04/20/24 [id number])   Procedures Procedures  (including critical care time)   This chart was dictated using voice recognition software.  Despite best efforts to proofread,  errors can occur which can change the documentation meaning.   Trine Raynell Moder, MD 04/20/24 [phone]  "

## 2024-04-20 NOTE — ED Notes (Signed)
 Patient transported to CT

## 2024-04-20 NOTE — ED Notes (Signed)
 Pt. Put on bedside commode as requested; Pt. Had a BM

## 2024-04-20 NOTE — ED Notes (Signed)
 In & Out cath performed.

## 2024-04-21 ENCOUNTER — Ambulatory Visit: Payer: Self-pay

## 2024-04-21 ENCOUNTER — Telehealth: Admitting: Family Medicine

## 2024-04-21 ENCOUNTER — Encounter: Payer: Self-pay | Admitting: Family Medicine

## 2024-04-21 DIAGNOSIS — M150 Primary generalized (osteo)arthritis: Secondary | ICD-10-CM | POA: Diagnosis not present

## 2024-04-21 MED ORDER — DICLOFENAC SODIUM 1 % EX GEL: 2.0000 g | Freq: Four times a day (QID) | CUTANEOUS | 1 refills | Status: AC

## 2024-04-21 NOTE — Assessment & Plan Note (Signed)
 Primary generalized osteoarthritis with acute right arm exacerbation Acute exacerbation with swelling and pain in the right arm, including wrist and shoulder. No trauma history. Blisters present but not infected. - Switch to ice application for pain management. - Continue using wrap for swelling. - Apply Voltaren  gel to wrist, elbow, shoulder. - Inform home physical therapist of limited range of motion for care plan update. - Sent prescription for Voltaren  gel to pharmacy.  Orders:   diclofenac  Sodium (VOLTAREN ) 1 % GEL; Apply 2 g topically 4 (four) times daily.

## 2024-04-21 NOTE — Progress Notes (Signed)
 "  Virtual Visit via Video Note  I connected with Wendy Torres on 04/21/2024 at  2:40 PM EST by a video enabled telemedicine application and verified that I am speaking with the correct person using two identifiers.  Patient Location: Home Provider Location: Office/Clinic  I discussed the limitations, risks, security, and privacy concerns of performing an evaluation and management service by video and the availability of in person appointments. I also discussed with the patient that there may be a patient responsible charge related to this service. The patient expressed understanding and agreed to proceed.  Subjective: PCP: [name], MD  Chief Complaint  Patient presents with   blisters     Oval blisters - fluid filled on right wrist - noticed this AM     Discussed the use of AI scribe software for clinical note transcription with the patient, who gave verbal consent to proceed.  Patient is accompanied by her daughter, who she is okay with being present.  She has been experiencing pain and swelling in her entire right arm, particularly in the wrist, for the past three days. The wrist swelling is a new symptom, while the pain in the rest of the arm is similar to her usual arthritis pain but slightly worse. Her shoulder hurts, and she can hardly use her right hand, which is a new development.  No recent falls or injuries that could have caused the symptoms. She has been using heat and a wrap on the wrist to manage the swelling and has taken Tylenol  and tramadol  for pain relief, which provide some help. She has not taken any pain medication today.  She mentions the presence of blisters that appeared overnight, which are large.      ROS: Per HPI Current Medications[1]  Observations/Objective: There were no vitals filed for this visit. Physical Exam Constitutional:      General: She is not in acute distress.    Appearance: Normal appearance. She is not ill-appearing or  toxic-appearing.  Eyes:     General: No scleral icterus.       Right eye: No discharge.        Left eye: No discharge.     Conjunctiva/sclera: Conjunctivae normal.  Pulmonary:     Effort: Pulmonary effort is normal. No respiratory distress.  Musculoskeletal:     Right shoulder: Decreased range of motion.     Right wrist: Swelling (with two large blisters without erythema) present.  Neurological:     Mental Status: She is alert and oriented to person, place, and time.  Psychiatric:        Mood and Affect: Mood normal.        Behavior: Behavior normal.        Thought Content: Thought content normal.        Judgment: Judgment normal.     Assessment & Plan Primary osteoarthritis involving multiple joints Primary generalized osteoarthritis with acute right arm exacerbation Acute exacerbation with swelling and pain in the right arm, including wrist and shoulder. No trauma history. Blisters present but not infected. - Switch to ice application for pain management. - Continue using wrap for swelling. - Apply Voltaren  gel to wrist, elbow, shoulder. - Inform home physical therapist of limited range of motion for care plan update. - Sent prescription for Voltaren  gel to pharmacy.  Orders:   diclofenac  Sodium (VOLTAREN ) 1 % GEL; Apply 2 g topically 4 (four) times daily.   Follow Up Instructions: Return if symptoms worsen or fail  to improve.   I discussed the assessment and treatment plan with the patient. The patient was provided an opportunity to ask questions, and all were answered. The patient agreed with the plan and demonstrated an understanding of the instructions.   The patient was advised to call back or seek an in-person evaluation if the symptoms worsen or if the condition fails to improve as anticipated.  The above assessment and management plan was discussed with the patient. The patient verbalized understanding of and has agreed to the management plan.   Niki JULIANNA Rung,  FNP    [1]  Current Outpatient Medications:    acetaminophen  (TYLENOL ) 325 MG tablet, Take 2 tablets (650 mg total) by mouth every 6 (six) hours as needed for mild pain (or Fever >/= 101)., Disp: 40 tablet, Rfl: 0   albuterol  (VENTOLIN  HFA) 108 (90 Base) MCG/ACT inhaler, Inhale 2 puffs into the lungs 3 (three) times daily for 3 days, THEN 2 puffs every 4 (four) hours as needed for wheezing or shortness of breath., Disp: , Rfl:    ALPRAZolam  (XANAX ) 0.25 MG tablet, Take 1 tablet (0.25 mg total) by mouth 2 (two) times daily as needed., Disp: 60 tablet, Rfl: 2   amLODipine -valsartan  (EXFORGE ) 10-320 MG tablet, Take 1 tablet by mouth daily., Disp: 90 tablet, Rfl: 3   aspirin  EC 81 MG tablet, Take 1 tablet (81 mg total) by mouth daily. Swallow whole., Disp: , Rfl:    Cholecalciferol (VITAMIN D3) 50 MCG (2000 UT) capsule, Take 2,000 Units by mouth daily., Disp: , Rfl:    cloNIDine  (CATAPRES ) 0.2 MG tablet, TAKE 1 TABLET BY MOUTH TWICE  DAILY, Disp: 200 tablet, Rfl: 2   diclofenac  Sodium (VOLTAREN ) 1 % GEL, Apply 2 g topically 4 (four) times daily., Disp: 150 g, Rfl: 1   fluticasone  (FLONASE ) 50 MCG/ACT nasal spray, Place 2 sprays into both nostrils daily for 7 days., Disp: 11.1 mL, Rfl: 0   furosemide  (LASIX ) 40 MG tablet, Take 1 tablet (40 mg total) by mouth daily., Disp: 90 tablet, Rfl: 3   gabapentin  (NEURONTIN ) 600 MG tablet, TAKE 1 TABLET BY MOUTH 3 TIMES  DAILY, Disp: 300 tablet, Rfl: 2   guaiFENesin  (MUCINEX ) 600 MG 12 hr tablet, Take 1 tablet (600 mg total) by mouth 2 (two) times daily., Disp: 30 tablet, Rfl: 0   hydrALAZINE  (APRESOLINE ) 100 MG tablet, TAKE 1 TABLET BY MOUTH 3 TIMES  DAILY, Disp: 270 tablet, Rfl: 2   ipratropium-albuterol  (DUONEB) 0.5-2.5 (3) MG/3ML SOLN, Take 3 mLs by nebulization every 4 (four) hours as needed (wheezing or shortness of breath)., Disp: 360 mL, Rfl: 5   iron  polysaccharides (NIFEREX) 150 MG capsule, TAKE 1 CAPSULE BY MOUTH EVERY DAY, Disp: 90 capsule, Rfl: 1    loratadine  (CLARITIN ) 10 MG tablet, Take 1 tablet (10 mg total) by mouth daily., Disp: 90 tablet, Rfl: 1   lovastatin  (MEVACOR ) 40 MG tablet, TAKE 1 TABLET BY MOUTH AT  BEDTIME, Disp: 100 tablet, Rfl: 2   meclizine  (ANTIVERT ) 12.5 MG tablet, Take 1 tablet (12.5 mg total) by mouth 3 (three) times daily as needed., Disp: 40 tablet, Rfl: 1   nystatin  (MYCOSTATIN ) 100000 UNIT/ML suspension, Swish and spit 5mL two times daily after using Symbicort ., Disp: 473 mL, Rfl: 5   OXYGEN , Inhale 4 L/min into the lungs continuous. Increase to 5L when moving, Disp: , Rfl:    pantoprazole  (PROTONIX ) 40 MG tablet, TAKE 1 TABLET BY MOUTH DAILY, Disp: 100 tablet, Rfl: 2   polyethylene  glycol (MIRALAX  / GLYCOLAX ) 17 g packet, Take 17 g by mouth daily as needed for mild constipation or moderate constipation., Disp: , Rfl:    potassium chloride  SA (KLOR-CON  M20) 20 MEQ tablet, Take 1 tablet (20 mEq total) by mouth daily., Disp: 90 tablet, Rfl: 3   solifenacin  (VESICARE ) 10 MG tablet, TAKE 1 TABLET BY MOUTH EVERY DAY, Disp: 90 tablet, Rfl: 2   tiZANidine  (ZANAFLEX ) 4 MG tablet, Take 1 tablet (4 mg total) by mouth every 6 (six) hours as needed for muscle spasms., Disp: 50 tablet, Rfl: 1   traMADol  (ULTRAM ) 50 MG tablet, Take 1 tablet (50 mg total) by mouth every 6 (six) hours as needed. for pain, Disp: 120 tablet, Rfl: 2   traZODone  (DESYREL ) 50 MG tablet, TAKE 1/2 TO 1 TABLET BY MOUTH AT BEDTIME AS NEEDED FOR SLEEP, Disp: 100 tablet, Rfl: 1   TRELEGY ELLIPTA  200-62.5-25 MCG/ACT AEPB, USE 1 INHALATION BY MOUTH ONCE  DAILY AT THE SAME TIME EACH DAY  (DUE FOR APPT, CALL OFFICE), Disp: 60 each, Rfl: 11   triamcinolone  cream (KENALOG ) 0.1 %, Apply topically 2 (two) times daily., Disp: 30 g, Rfl: 1   Turmeric (QC TUMERIC COMPLEX PO), Take 1 tablet by mouth daily., Disp: , Rfl:   "

## 2024-04-21 NOTE — Telephone Encounter (Signed)
" °  FYI Only or Action Required?: FYI only for provider: appointment scheduled on 12.22.25.  Patient was last seen in primary care on 03/20/2024 by [name], MD.  Called Nurse Triage reporting Blister.  Symptoms began today.  Interventions attempted: Nothing.  Symptoms are: gradually worsening.  Triage Disposition: See Physician Within 24 Hours  Patient/caregiver understands and will follow disposition?: Yes  Copied from CRM #[id number]. Topic: Clinical - Red Word Triage >> Apr 21, 2024  8:26 AM Antwanette L wrote: Red Word that prompted transfer to Nurse Triage: Katrina, the patient's daughter, is calling because the patient had an ER visit on [facility]) and was discharged yesterday. The patient now has fluid-filled blisters on her right wrist, along with swelling, pain, and minimal mobility >> Apr 21, 2024  9:05 AM Amy B wrote: Patient's daughter, Woodie, called again stating the call had been dropped while attempting transfer to NT. >> Apr 21, 2024  8:51 AM Antwanette L wrote: I returned to check on the patients daughter, who was calling on the patients behalf, but there was no response on the line. Since she is listed on the DPR, NT will call her back. Katrina is calling from [phone]. Reason for Disposition  [1] Cause unknown AND [2] new blisters are developing  Answer Assessment - Initial Assessment Questions 1. APPEARANCE of BLISTER: What does it look like?      Fluid filled sacs        3. LOCATION: Where are the blisters located?       R wrist  4. WHEN: When did the blister happen?      This morning  5. CAUSE: What do you think caused the blister?      Unsure  6. PAIN: Does it hurt? If Yes, ask: How bad is the pain?  (Scale 0-10; or none, mild, moderate, severe)      Reports pain  7. OTHER SYMPTOMS: Pt's daughter denies Limited mobility and swelling- difficulty gripping and moving wrist       Pt's daughter reports blisters  on right wrist Pt scheduled for a visit on 12.22.25  for further evaluation. Pt seen in ED 2 days ago, however, due to unrelated symptoms pt scheduled for an acute visit Pt's daughter agrees with plan of care, will call back for any worsening symptoms  Protocols used: Blister - Foot and Hand-A-AH  "

## 2024-04-22 ENCOUNTER — Ambulatory Visit: Admitting: Cardiology

## 2024-04-28 ENCOUNTER — Encounter: Payer: Self-pay | Admitting: Internal Medicine

## 2024-04-29 ENCOUNTER — Telehealth: Payer: Self-pay

## 2024-04-29 MED ORDER — DOXYCYCLINE HYCLATE 100 MG PO TABS: 100.0000 mg | ORAL_TABLET | Freq: Two times a day (BID) | ORAL | 0 refills | Status: DC

## 2024-04-29 NOTE — Telephone Encounter (Signed)
 Copied from CRM #[id number]. Topic: Clinical - Home Health Verbal Orders >> Apr 29, 2024  1:11 PM Deleta RAMAN wrote: Caller/Agency: [facility] Callback Number: [phone] Service Requested: Skilled Nursing Frequency: 2x week Any new concerns about the patient? No

## 2024-04-30 NOTE — Telephone Encounter (Signed)
 Verbals given to [place] today.

## 2024-05-03 ENCOUNTER — Other Ambulatory Visit: Payer: Self-pay | Admitting: Internal Medicine

## 2024-05-03 DIAGNOSIS — M150 Primary generalized (osteo)arthritis: Secondary | ICD-10-CM

## 2024-05-06 ENCOUNTER — Telehealth: Payer: Self-pay

## 2024-05-06 NOTE — Telephone Encounter (Signed)
 Copied from CRM [id number]. Topic: General - Other >> May 06, 2024  9:14 AM Adelita E wrote: Reason for CRM: RICK GLENWOOD Garre, home health nurse with [facility], called into report patient's blood pressure reading of 176/68. Patient is asymptomatic. Callback number [phone]. Garre is also going to resume home health for another 60 days, she will fax over the order.

## 2024-05-06 NOTE — Telephone Encounter (Signed)
 BPs have been good at her last 2 visits; we should continue HH and continue to monitor for persistent elevated BP over the 60 days, cont same tx for now

## 2024-05-08 ENCOUNTER — Encounter: Payer: Self-pay | Admitting: Internal Medicine

## 2024-05-09 ENCOUNTER — Other Ambulatory Visit: Payer: Self-pay

## 2024-05-09 MED ORDER — SOLIFENACIN SUCCINATE 10 MG PO TABS: 10.0000 mg | ORAL_TABLET | Freq: Every day | ORAL | 2 refills | Status: DC

## 2024-05-19 ENCOUNTER — Encounter: Payer: Self-pay | Admitting: Internal Medicine

## 2024-05-19 ENCOUNTER — Other Ambulatory Visit (INDEPENDENT_AMBULATORY_CARE_PROVIDER_SITE_OTHER)

## 2024-05-19 ENCOUNTER — Telehealth: Payer: Self-pay

## 2024-05-19 ENCOUNTER — Ambulatory Visit: Payer: Self-pay | Admitting: Internal Medicine

## 2024-05-19 ENCOUNTER — Other Ambulatory Visit: Payer: Self-pay | Admitting: Internal Medicine

## 2024-05-19 DIAGNOSIS — R300 Dysuria: Secondary | ICD-10-CM

## 2024-05-19 LAB — URINALYSIS, ROUTINE W REFLEX MICROSCOPIC
Bilirubin Urine: NEGATIVE
Ketones, ur: NEGATIVE
Nitrite: NEGATIVE
Specific Gravity, Urine: <=1.005 — AB (ref 1.000–1.030)
Total Protein, Urine: NEGATIVE
Urine Glucose: NEGATIVE
Urobilinogen, UA: 0.2 (ref 0.0–1.0)
pH: 7 (ref 5.0–8.0)

## 2024-05-19 MED ORDER — CIPROFLOXACIN HCL 500 MG PO TABS: 500.0000 mg | ORAL_TABLET | Freq: Two times a day (BID) | ORAL | 0 refills | Status: DC

## 2024-05-19 NOTE — Telephone Encounter (Signed)
 This has been addressed Via Mychart.

## 2024-05-19 NOTE — Telephone Encounter (Signed)
 Copied from CRM [id number]. Topic: Clinical - Request for Lab/Test Order >> May 19, 2024 12:52 PM Jasmin G wrote: Reason for CRM: Pt requested for lab orders for urine testing to be sent ASAP, please refer to recent Encounters for more info.

## 2024-05-20 ENCOUNTER — Other Ambulatory Visit: Payer: Self-pay | Admitting: Internal Medicine

## 2024-05-20 MED ORDER — POLYSACCHARIDE IRON COMPLEX 150 MG PO CAPS: 150.0000 mg | ORAL_CAPSULE | Freq: Every day | ORAL | 1 refills | Status: AC

## 2024-05-21 ENCOUNTER — Telehealth: Payer: Self-pay | Admitting: Oncology

## 2024-05-21 ENCOUNTER — Ambulatory Visit: Admitting: Cardiology

## 2024-05-21 NOTE — Telephone Encounter (Signed)
 I spoke with patient's daughter as she called in to reschedule lab and MD from 05/22/2024 to 06/04/2024 due to patient not feeling well.

## 2024-05-22 ENCOUNTER — Other Ambulatory Visit: Payer: Self-pay | Admitting: Internal Medicine

## 2024-05-22 ENCOUNTER — Inpatient Hospital Stay

## 2024-05-22 ENCOUNTER — Telehealth: Payer: Self-pay | Admitting: Internal Medicine

## 2024-05-22 ENCOUNTER — Inpatient Hospital Stay: Admitting: Oncology

## 2024-05-22 LAB — URINE CULTURE

## 2024-05-22 MED ORDER — NITROFURANTOIN MONOHYD MACRO 100 MG PO CAPS: 100.0000 mg | ORAL_CAPSULE | Freq: Two times a day (BID) | ORAL | 0 refills | Status: AC

## 2024-05-22 NOTE — Telephone Encounter (Signed)
 Ok to transfer, ok to keep the April appointment as her [facility] visit.

## 2024-05-22 NOTE — Telephone Encounter (Signed)
 Copied from CRM #[id number]. Topic: Appointments - Transfer of Care >> May 22, 2024 11:54 AM Chasity T wrote: Pt is requesting to transfer FROM: John,James Pt is requesting to transfer TO: Michael,Barbara Reason for requested transfer: retiring pcp It is the responsibility of the team the patient would like to transfer to (Dr. Ozell Railing) to reach out to the patient if for any reason this transfer is not acceptable.  FYI

## 2024-05-22 NOTE — Telephone Encounter (Signed)
 Appt previously scheduled on 08/05/2024.

## 2024-05-23 ENCOUNTER — Telehealth: Payer: Self-pay

## 2024-05-23 NOTE — Telephone Encounter (Signed)
 Copied from CRM [id number]. Topic: Clinical - Medication Question >> May 23, 2024 12:27 PM Alfonso HERO wrote: Reason for CRM: patients daughter calling to ask if there are any samples for TRELEGY ELLIPTA  200-62.5-25 MCG/ACT AEPB in the office. Patient has a $400 deductible that has to be paid before her insurance will pay and her last puff is in the morning. She is asking for a call back at [phone] >> May 23, 2024  4:39 PM Sophia H wrote: Patients daughter Woodie is following back up on request for medication samples, please reach out. # [phone]

## 2024-05-23 NOTE — Telephone Encounter (Signed)
 Copied from CRM [id number]. Topic: Clinical - Medication Question >> May 23, 2024 12:27 PM Alfonso HERO wrote: Reason for CRM: patients daughter calling to ask if there are any samples for TRELEGY ELLIPTA  200-62.5-25 MCG/ACT AEPB in the office. Patient has a $400 deductible that has to be paid before her insurance will pay and her last puff is in the morning. She is asking for a call back at [phone]

## 2024-05-23 NOTE — Telephone Encounter (Signed)
 Oh yes please, as the change is due to the urine culture showing cipro  not likely to work well enough,   thanks

## 2024-05-26 ENCOUNTER — Encounter: Payer: Self-pay | Admitting: Internal Medicine

## 2024-05-26 NOTE — Telephone Encounter (Signed)
 This has been addressed via Mychart , The sample was left up front on Friday.

## 2024-05-27 ENCOUNTER — Ambulatory Visit: Admitting: Internal Medicine

## 2024-05-27 ENCOUNTER — Ambulatory Visit: Payer: Medicare Other

## 2024-05-27 ENCOUNTER — Encounter: Payer: Self-pay | Admitting: Internal Medicine

## 2024-05-27 VITALS — Ht 64.0 in | Wt 189.0 lb

## 2024-05-27 DIAGNOSIS — L989 Disorder of the skin and subcutaneous tissue, unspecified: Secondary | ICD-10-CM | POA: Insufficient documentation

## 2024-05-27 DIAGNOSIS — R5381 Other malaise: Secondary | ICD-10-CM | POA: Insufficient documentation

## 2024-05-27 DIAGNOSIS — N390 Urinary tract infection, site not specified: Secondary | ICD-10-CM | POA: Diagnosis not present

## 2024-05-27 DIAGNOSIS — J4489 Other specified chronic obstructive pulmonary disease: Secondary | ICD-10-CM | POA: Diagnosis not present

## 2024-05-27 DIAGNOSIS — Z0000 Encounter for general adult medical examination without abnormal findings: Secondary | ICD-10-CM

## 2024-05-27 DIAGNOSIS — I10 Essential (primary) hypertension: Secondary | ICD-10-CM | POA: Diagnosis not present

## 2024-05-27 DIAGNOSIS — J439 Emphysema, unspecified: Secondary | ICD-10-CM

## 2024-05-27 MED ORDER — TIZANIDINE HCL 4 MG PO TABS: 4.0000 mg | ORAL_TABLET | Freq: Four times a day (QID) | ORAL | 1 refills | Status: AC | PRN

## 2024-05-27 MED ORDER — GABAPENTIN 600 MG PO TABS: 600.0000 mg | ORAL_TABLET | Freq: Three times a day (TID) | ORAL | 1 refills | Status: AC

## 2024-05-27 MED ORDER — SOLIFENACIN SUCCINATE 10 MG PO TABS: 10.0000 mg | ORAL_TABLET | Freq: Every day | ORAL | 3 refills | Status: AC

## 2024-05-27 MED ORDER — DOXYCYCLINE HYCLATE 100 MG PO TABS: 100.0000 mg | ORAL_TABLET | Freq: Two times a day (BID) | ORAL | 0 refills | Status: AC

## 2024-05-27 MED ORDER — AMLODIPINE BESYLATE-VALSARTAN 10-320 MG PO TABS: 1.0000 | ORAL_TABLET | Freq: Every day | ORAL | 3 refills | Status: AC

## 2024-05-27 MED ORDER — LOVASTATIN 40 MG PO TABS: 40.0000 mg | ORAL_TABLET | Freq: Every day | ORAL | 3 refills | Status: AC

## 2024-05-27 MED ORDER — HYDRALAZINE HCL 100 MG PO TABS: 100.0000 mg | ORAL_TABLET | Freq: Three times a day (TID) | ORAL | 3 refills | Status: AC

## 2024-05-27 MED ORDER — PANTOPRAZOLE SODIUM 40 MG PO TBEC: 40.0000 mg | DELAYED_RELEASE_TABLET | Freq: Every day | ORAL | 3 refills | Status: AC

## 2024-05-27 MED ORDER — BREZTRI AEROSPHERE 160-9-4.8 MCG/ACT IN AERO: 2.0000 | INHALATION_SPRAY | Freq: Two times a day (BID) | RESPIRATORY_TRACT | 3 refills | Status: AC

## 2024-05-27 MED ORDER — TRIAMCINOLONE ACETONIDE 0.1 % EX CREA: TOPICAL_CREAM | Freq: Two times a day (BID) | CUTANEOUS | 1 refills | Status: AC

## 2024-05-27 MED ORDER — FUROSEMIDE 40 MG PO TABS: 40.0000 mg | ORAL_TABLET | Freq: Every day | ORAL | 3 refills | Status: AC

## 2024-05-27 MED ORDER — CLONIDINE HCL 0.2 MG PO TABS: 0.2000 mg | ORAL_TABLET | Freq: Two times a day (BID) | ORAL | 3 refills | Status: AC

## 2024-05-27 NOTE — Assessment & Plan Note (Signed)
 With increased difficutly with stamina on room to room o/w stable - for [facility] with PT

## 2024-05-27 NOTE — Telephone Encounter (Signed)
 Addressed all at visit

## 2024-05-27 NOTE — Patient Instructions (Signed)
 Please take all new medication as prescribed

## 2024-05-27 NOTE — Assessment & Plan Note (Signed)
 With Enteroccus on micro, somewhat improved with nitrofurantoin  - to finish this, and  to f/u any worsening symptoms or concerns

## 2024-05-27 NOTE — Patient Instructions (Addendum)
 Ms. Bansal,  Thank you for taking the time for your Medicare Wellness Visit. I appreciate your continued commitment to your health goals. Please review the care plan we discussed, and feel free to reach out if I can assist you further.  Please note that Annual Wellness Visits do not include a physical exam. Some assessments may be limited, especially if the visit was conducted virtually. If needed, we may recommend an in-person follow-up with your provider.  Ongoing Care Seeing your primary care provider every 3 to 6 months helps us  monitor your health and provide consistent, personalized care.   Referrals If a referral was made during today's visit and you haven't received any updates within two weeks, please contact the referred provider directly to check on the status.  Recommended Screenings:  Health Maintenance  Topic Date Due   Zoster (Shingles) Vaccine (1 of 2) Never done   DTaP/Tdap/Td vaccine (2 - Td or Tdap) 04/06/2021   Medicare Annual Wellness Visit  05/27/2025   Pneumococcal Vaccine for age over 84  Completed   Flu Shot  Completed   Osteoporosis screening with Bone Density Scan  Completed   Meningitis B Vaccine  Aged Out   COVID-19 Vaccine  Discontinued       05/27/2024    2:21 PM  Advanced Directives  Does Patient Have a Medical Advance Directive? No  Would patient like information on creating a medical advance directive? Yes (MAU/Ambulatory/Procedural Areas - Information given)    Vision: Annual vision screenings are recommended for early detection of glaucoma, cataracts, and diabetic retinopathy. These exams can also reveal signs of chronic conditions such as diabetes and high blood pressure.  Dental: Annual dental screenings help detect early signs of oral cancer, gum disease, and other conditions linked to overall health, including heart disease and diabetes.

## 2024-05-27 NOTE — Assessment & Plan Note (Signed)
 Persistent x 2 mo, somewhat tender and improved with neosporin and appears possible infectious though limited by exam today, ok for Doxycycline  100 bid

## 2024-05-27 NOTE — Assessment & Plan Note (Signed)
 Trelegy too expensive, plans to address with pulm but not yet had a chance - ok for trial change to Breztri  bid and hopefully less expensive.  Pt continues to f/u with pulm as planned

## 2024-05-27 NOTE — Progress Notes (Signed)
 "  Chief Complaint  Patient presents with   Medicare Wellness     Subjective:   EMA Wendy Torres is a 84 y.o. female who presents for a Medicare Annual Wellness Visit.  Visit info / Clinical Intake: Medicare Wellness Visit Type:: Subsequent Annual Wellness Visit Persons participating in visit and providing information:: caregiver (with patient present) (daughter, [name]) Medicare Wellness Visit Mode:: Telephone If telephone:: video declined Since this visit was completed virtually, some vitals may be partially provided or unavailable. Missing vitals are due to the limitations of the virtual format.: Documented vitals are patient reported If Telephone or Video please confirm:: I connected with patient using audio/video enable telemedicine. I verified patient identity with two identifiers, discussed telehealth limitations, and patient agreed to proceed. Patient Location:: Home Provider Location:: Office Interpreter Needed?: No Pre-visit prep was completed: yes AWV questionnaire completed by patient prior to visit?: yes Date:: 05/26/24 Living arrangements:: with family/others Patient's Overall Health Status Rating: (!) fair Typical amount of pain: (!) a lot (rating - 6) Does pain affect daily life?: (!) yes (bilateral knees) Are you currently prescribed opioids?: no  Dietary Habits and Nutritional Risks How many meals a day?: 3 Eats fruit and vegetables daily?: (!) no Most meals are obtained by: having others provide food In the last 2 weeks, have you had any of the following?: none Diabetic:: no  Functional Status Activities of Daily Living (to include ambulation/medication): (!) Needs Assist Feeding: Independent Dressing/Grooming: Needs assistance (family helps) Bathing: Needs assistance (family helps) Toileting: Needs assistance (family helps) Transfer: Independent Ambulation: Independent with device- listed below Home Assistive Devices/Equipment: Walker (specify  Type); Oxygen ; Eyeglasses; Nebulizers; Dentures (specify type) (Oxygen  - 4-5liters) Medication Administration: Dependent (Dtr helps) Is this a change from baseline?: Change from baseline, expected to last >3 days Home Management (perform basic housework or laundry): Dependent (family handles) Manage your own finances?: (!) no Primary transportation is: family / friends Concerns about vision?: no *vision screening is required for WTM* Concerns about hearing?: no  Fall Screening Falls in the past year?: 0 Number of falls in past year: 0 Was there an injury with Fall?: 0 Fall Risk Category Calculator: 0 Patient Fall Risk Level: Low Fall Risk  Fall Risk Patient at Risk for Falls Due to: No Fall Risks; Impaired balance/gait Fall risk Follow up: Falls evaluation completed; Falls prevention discussed  Home and Transportation Safety: All rugs have non-skid backing?: yes All stairs or steps have railings?: yes Grab bars in the bathtub or shower?: yes Have non-skid surface in bathtub or shower?: yes Good home lighting?: yes Regular seat belt use?: yes Hospital stays in the last year:: (!) yes How many hospital stays:: 2 Reason: low oxygen  levels/observation  Cognitive Assessment Difficulty concentrating, remembering, or making decisions? : no Will 6CIT or Mini Cog be Completed: no 6CIT or Mini Cog Declined: patient alert, oriented, able to answer questions appropriately and recall recent events  Advance Directives (For Healthcare) Does Patient Have a Medical Advance Directive?: No Would patient like information on creating a medical advance directive?: Yes (MAU/Ambulatory/Procedural Areas - Information given)  Reviewed/Updated  Reviewed/Updated: Reviewed All (Medical, Surgical, Family, Medications, Allergies, Care Teams, Patient Goals)    Allergies (verified) Augmentin  [amoxicillin -pot clavulanate], Benazepril , and Chlorthalidone   Current Medications (verified) Outpatient  Encounter Medications as of 05/27/2024  Medication Sig   acetaminophen  (TYLENOL ) 325 MG tablet Take 2 tablets (650 mg total) by mouth every 6 (six) hours as needed for mild pain (or Fever >/= 101).  albuterol  (VENTOLIN  HFA) 108 (90 Base) MCG/ACT inhaler Inhale 2 puffs into the lungs 3 (three) times daily for 3 days, THEN 2 puffs every 4 (four) hours as needed for wheezing or shortness of breath.   ALPRAZolam  (XANAX ) 0.25 MG tablet TAKE 1 TABLET BY MOUTH TWICE  DAILY AS NEEDED   amLODipine -valsartan  (EXFORGE ) 10-320 MG tablet Take 1 tablet by mouth daily.   aspirin  EC 81 MG tablet Take 1 tablet (81 mg total) by mouth daily. Swallow whole.   budesonide -glycopyrrolate -formoterol  (BREZTRI  AEROSPHERE) 160-9-4.8 MCG/ACT AERO inhaler Inhale 2 puffs into the lungs 2 (two) times daily.   Cholecalciferol (VITAMIN D3) 50 MCG (2000 UT) capsule Take 2,000 Units by mouth daily.   cloNIDine  (CATAPRES ) 0.2 MG tablet Take 1 tablet (0.2 mg total) by mouth 2 (two) times daily.   diclofenac  Sodium (VOLTAREN ) 1 % GEL Apply 2 g topically 4 (four) times daily.   doxycycline  (VIBRA -TABS) 100 MG tablet Take 1 tablet (100 mg total) by mouth 2 (two) times daily.   fluticasone  (FLONASE ) 50 MCG/ACT nasal spray Place 2 sprays into both nostrils daily for 7 days.   furosemide  (LASIX ) 40 MG tablet Take 1 tablet (40 mg total) by mouth daily.   gabapentin  (NEURONTIN ) 600 MG tablet Take 1 tablet (600 mg total) by mouth 3 (three) times daily.   guaiFENesin  (MUCINEX ) 600 MG 12 hr tablet Take 1 tablet (600 mg total) by mouth 2 (two) times daily.   hydrALAZINE  (APRESOLINE ) 100 MG tablet Take 1 tablet (100 mg total) by mouth 3 (three) times daily.   ipratropium-albuterol  (DUONEB) 0.5-2.5 (3) MG/3ML SOLN Take 3 mLs by nebulization every 4 (four) hours as needed (wheezing or shortness of breath).   iron  polysaccharides (NIFEREX) 150 MG capsule Take 1 capsule (150 mg total) by mouth daily.   loratadine  (CLARITIN ) 10 MG tablet Take 1 tablet  (10 mg total) by mouth daily.   lovastatin  (MEVACOR ) 40 MG tablet Take 1 tablet (40 mg total) by mouth at bedtime.   meclizine  (ANTIVERT ) 12.5 MG tablet Take 1 tablet (12.5 mg total) by mouth 3 (three) times daily as needed.   nitrofurantoin , macrocrystal-monohydrate, (MACROBID ) 100 MG capsule Take 1 capsule (100 mg total) by mouth 2 (two) times daily.   nystatin  (MYCOSTATIN ) 100000 UNIT/ML suspension Swish and spit 5mL two times daily after using Symbicort .   OXYGEN  Inhale 4 L/min into the lungs continuous. Increase to 5L when moving   pantoprazole  (PROTONIX ) 40 MG tablet Take 1 tablet (40 mg total) by mouth daily.   polyethylene glycol (MIRALAX  / GLYCOLAX ) 17 g packet Take 17 g by mouth daily as needed for mild constipation or moderate constipation.   potassium chloride  SA (KLOR-CON  M20) 20 MEQ tablet Take 1 tablet (20 mEq total) by mouth daily.   solifenacin  (VESICARE ) 10 MG tablet Take 1 tablet (10 mg total) by mouth daily.   tiZANidine  (ZANAFLEX ) 4 MG tablet Take 1 tablet (4 mg total) by mouth every 6 (six) hours as needed for muscle spasms.   traMADol  (ULTRAM ) 50 MG tablet TAKE 1 TABLET BY MOUTH EVERY 6  HOURS AS NEEDED FOR PAIN   traZODone  (DESYREL ) 50 MG tablet TAKE 1/2 TO 1 TABLET BY MOUTH AT BEDTIME AS NEEDED FOR SLEEP   TRELEGY ELLIPTA  200-62.5-25 MCG/ACT AEPB USE 1 INHALATION BY MOUTH ONCE  DAILY AT THE SAME TIME EACH DAY  (DUE FOR APPT, CALL OFFICE)   triamcinolone  cream (KENALOG ) 0.1 % Apply topically 2 (two) times daily.   Turmeric (QC TUMERIC COMPLEX PO)  Take 1 tablet by mouth daily.   No facility-administered encounter medications on file as of 05/27/2024.    History: Past Medical History:  Diagnosis Date   ALLERGIC RHINITIS 12/09/2006   ANEMIA 01/14/2009   Anxiety    Arthritis    ASTHMA 12/09/2006   Cataract    bilateral -removed   CHF (congestive heart failure) (HCC)    Chronic kidney disease    Chronic pain syndrome 02/16/2009   COPD 12/09/2006   DEGENERATIVE  JOINT DISEASE 12/09/2006   DIZZINESS 03/30/2010   DYSPNEA 01/27/2010   with heavy exertion   FREQUENCY, URINARY 03/30/2010   GERD 12/09/2006   HELICOBACTER PYLORI GASTRITIS, HX OF 12/09/2006   HIATAL HERNIA 03/01/2010   HYPERLIPIDEMIA 04/18/2007   HYPERSOMNIA 08/31/2008   HYPERTENSION 12/09/2006   INSOMNIA-SLEEP DISORDER-UNSPEC 11/05/2008   LOW BACK PAIN 12/09/2006   Morbid obesity (HCC) 12/09/2006   Nonspecific (abnormal) findings on radiological and other examination of body structure 11/05/2008   OSTEOPOROSIS 12/09/2006   OVERACTIVE BLADDER 04/14/2008   PALPITATIONS, RECURRENT 01/27/2010   Stroke (HCC)    pt. stated light one years ago   SYNCOPE 11/05/2008   TRANSIENT ISCHEMIC ATTACK, HX OF 12/09/2006   Past Surgical History:  Procedure Laterality Date   BIOPSY  01/18/2023   Procedure: BIOPSY;  Surgeon: Aneita Gwendlyn DASEN, MD;  Location: [facility];  Service: Gastroenterology;;   COLONOSCOPY     ESOPHAGOGASTRODUODENOSCOPY (EGD) WITH PROPOFOL  N/A 01/18/2023   Procedure: ESOPHAGOGASTRODUODENOSCOPY (EGD) WITH PROPOFOL ;  Surgeon: Aneita Gwendlyn DASEN, MD;  Location: [facility];  Service: Gastroenterology;  Laterality: N/A;   HOT HEMOSTASIS N/A 01/18/2023   Procedure: HOT HEMOSTASIS (ARGON PLASMA COAGULATION/BICAP);  Surgeon: Aneita Gwendlyn DASEN, MD;  Location: [facility];  Service: Gastroenterology;  Laterality: N/A;   LUMBAR DISC SURGERY     TONSILLECTOMY     as a child   TOTAL HIP ARTHROPLASTY Right 11/06/2012   Procedure: RIGHT TOTAL HIP ARTHROPLASTY WITH ACETABULAR AUTOGRAFT;  Surgeon: Dempsey LULLA Moan, MD;  Location: [facility];  Service: Orthopedics;  Laterality: Right;   TOTAL HIP ARTHROPLASTY Left 02/18/2014   Procedure: LEFT TOTAL HIP ARTHROPLASTY;  Surgeon: Dempsey Moan LULLA, MD;  Location: [facility];  Service: Orthopedics;  Laterality: Left;   TUBAL LIGATION     Family History  Problem Relation Age of Onset   Heart disease Mother        Had pacemaker   Lung cancer Father     Hypertension Brother    Diabetes Neg Hx    Colon cancer Neg Hx    Colon polyps Neg Hx    Kidney disease Neg Hx    Gallbladder disease Neg Hx    Esophageal cancer Neg Hx    Allergic rhinitis Neg Hx    Angioedema Neg Hx    Asthma Neg Hx    Atopy Neg Hx    Eczema Neg Hx    Immunodeficiency Neg Hx    Urticaria Neg Hx    Social History   Occupational History   Occupation: Retired  Tobacco Use   Smoking status: Former    Current packs/day: 0.00    Average packs/day: 0.5 packs/day for 25.0 years (12.5 ttl pk-yrs)    Types: Cigarettes    Start date: 10/30/1947    Quit date: 10/29/1972    Years since quitting: 51.6   Smokeless tobacco: Never  Vaping Use   Vaping status: Never Used  Substance and Sexual Activity   Alcohol  use: No    Alcohol /week: 0.0 standard  drinks of alcohol    Drug use: No   Sexual activity: Not Currently   Tobacco Counseling Counseling given: Yes  SDOH Screenings   Food Insecurity: No Food Insecurity (05/27/2024)  Housing: Unknown (05/27/2024)  Transportation Needs: Unmet Transportation Needs (05/27/2024)  Utilities: Not At Risk (05/27/2024)  Alcohol  Screen: Low Risk (12/13/2023)  Depression (PHQ2-9): Low Risk (05/27/2024)  Financial Resource Strain: Low Risk (05/26/2024)  Physical Activity: Inactive (05/27/2024)  Social Connections: Socially Isolated (05/27/2024)  Stress: Stress Concern Present (05/27/2024)  Tobacco Use: Medium Risk (05/27/2024)  Health Literacy: Adequate Health Literacy (05/27/2024)   See flowsheets for full screening details  Depression Screen PHQ 2 & 9 Depression Scale- Over the past 2 weeks, how often have you been bothered by any of the following problems? Little interest or pleasure in doing things: 0 Feeling down, depressed, or hopeless (PHQ Adolescent also includes...irritable): 0 PHQ-2 Total Score: 0 Trouble falling or staying asleep, or sleeping too much: 0 Feeling tired or having little energy: 0 Poor appetite or overeating (PHQ  Adolescent also includes...weight loss): 0 Feeling bad about yourself - or that you are a failure or have let yourself or your family down: 0 Trouble concentrating on things, such as reading the newspaper or watching television (PHQ Adolescent also includes...like school work): 0 Moving or speaking so slowly that other people could have noticed. Or the opposite - being so fidgety or restless that you have been moving around a lot more than usual: 0 Thoughts that you would be better off dead, or of hurting yourself in some way: 0 PHQ-9 Total Score: 0 If you checked off any problems, how difficult have these problems made it for you to do your work, take care of things at home, or get along with other people?: Not difficult at all  Depression Treatment Depression Interventions/Treatment : [id number] Score <4 Follow-up Not Indicated     Goals Addressed               This Visit's Progress     Patient Stated (pt-stated)        Patient state she plans to continue stretching and exercises given by PT             Objective:    Today's Vitals   05/27/24 1419  Weight: 189 lb (85.7 kg)  Height: 5' 4 (1.626 m)   Body mass index is 32.44 kg/m.  Hearing/Vision screen Hearing Screening - Comments:: Denies hearing difficulties   Vision Screening - Comments:: Wears eyeglasses for reading - up to date with routine eye exams with Jon Likes  Immunizations and Health Maintenance Health Maintenance  Topic Date Due   Zoster Vaccines- Shingrix (1 of 2) Never done   DTaP/Tdap/Td (2 - Td or Tdap) 04/06/2021   Medicare Annual Wellness (AWV)  05/27/2025   Pneumococcal Vaccine: 50+ Years  Completed   Influenza Vaccine  Completed   Bone Density Scan  Completed   Meningococcal B Vaccine  Aged Out   COVID-19 Vaccine  Discontinued        Assessment/Plan:  This is a routine wellness examination for Kasy.  Patient Care Team: [name], MD as PCP - General Tobb, Kardie, DO as PCP -  Cardiology (Cardiology) Rodgers Dorothyann BRAVO, OT as Occupational Therapist (Occupational Therapy) Likes Jon, OD as Consulting Physician (Ophthalmology) Craig Alan SAUNDERS, PA-C (Gastroenterology)  I have personally reviewed and noted the following in the patients chart:   Medical and social history Use of alcohol , tobacco or illicit drugs  Current medications and supplements including opioid prescriptions. Functional ability and status Nutritional status Physical activity Advanced directives List of other physicians Hospitalizations, surgeries, and ER visits in previous 12 months Vitals Screenings to include cognitive, depression, and falls Referrals and appointments  No orders of the defined types were placed in this encounter.  In addition, I have reviewed and discussed with patient certain preventive protocols, quality metrics, and best practice recommendations. A written personalized care plan for preventive services as well as general preventive health recommendations were provided to patient.   Verdie CHRISTELLA Saba, CMA   05/27/2024   Return in 1 year (on 05/27/2025).  After Visit Summary: (MyChart) Due to this being a telephonic visit, the after visit summary with patients personalized plan was offered to patient via MyChart   Nurse Notes: pt will be transferring to [employer]; scheduled 027 AWV appt "

## 2024-05-27 NOTE — Progress Notes (Signed)
 Patient ID: Wendy Torres, female   DOB: 10/22/1940, 84 y.o.   MRN: [id number]  Virtual Visit via Video Note  I connected with Wendy Torres on 05/27/24 at 10:40 AM EST by a video enabled telemedicine application and verified that I am speaking with the correct person using two identifiers.  Location of all participants today Patient: at home with daughter present  Provider: at office   I discussed the limitations of evaluation and management by telemedicine and the availability of in person appointments. The patient expressed understanding and agreed to proceed.  History of Present Illness: Here with family, overall doing ok, pt continues on Home o2 4L (5L with exertion), and has had some loss of stamina post hospn nov 2025.  Pt denies chest pain, increased sob or doe, wheezing, orthopnea, PND, increased LE swelling, palpitations, dizziness or syncope.   Pt denies polydipsia, polyuria, or new focal neuro s/s.   Does also have tender sore spot to the right anterior mid upper arm, some better with neosporin but still persists, as well as other rash to extremities that prior improved with triam cream.  Daughter does mention that Trelegy is > $400, wondering if there is an alternative   Wt has recently been stable in last 2 mo on current med tx.  Urinary symptoms improved with nitrofurantoin  Past Medical History:  Diagnosis Date   ALLERGIC RHINITIS 12/09/2006   ANEMIA 01/14/2009   Anxiety    Arthritis    ASTHMA 12/09/2006   Cataract    bilateral -removed   CHF (congestive heart failure) (HCC)    Chronic kidney disease    Chronic pain syndrome 02/16/2009   COPD 12/09/2006   DEGENERATIVE JOINT DISEASE 12/09/2006   DIZZINESS 03/30/2010   DYSPNEA 01/27/2010   with heavy exertion   FREQUENCY, URINARY 03/30/2010   GERD 12/09/2006   HELICOBACTER PYLORI GASTRITIS, HX OF 12/09/2006   HIATAL HERNIA 03/01/2010   HYPERLIPIDEMIA 04/18/2007   HYPERSOMNIA 08/31/2008   HYPERTENSION 12/09/2006    INSOMNIA-SLEEP DISORDER-UNSPEC 11/05/2008   LOW BACK PAIN 12/09/2006   Morbid obesity (HCC) 12/09/2006   Nonspecific (abnormal) findings on radiological and other examination of body structure 11/05/2008   OSTEOPOROSIS 12/09/2006   OVERACTIVE BLADDER 04/14/2008   PALPITATIONS, RECURRENT 01/27/2010   Stroke (HCC)    pt. stated light one years ago   SYNCOPE 11/05/2008   TRANSIENT ISCHEMIC ATTACK, HX OF 12/09/2006   Past Surgical History:  Procedure Laterality Date   BIOPSY  01/18/2023   Procedure: BIOPSY;  Surgeon: Aneita Gwendlyn DASEN, MD;  Location: [facility];  Service: Gastroenterology;;   COLONOSCOPY     ESOPHAGOGASTRODUODENOSCOPY (EGD) WITH PROPOFOL  N/A 01/18/2023   Procedure: ESOPHAGOGASTRODUODENOSCOPY (EGD) WITH PROPOFOL ;  Surgeon: Aneita Gwendlyn DASEN, MD;  Location: [facility];  Service: Gastroenterology;  Laterality: N/A;   HOT HEMOSTASIS N/A 01/18/2023   Procedure: HOT HEMOSTASIS (ARGON PLASMA COAGULATION/BICAP);  Surgeon: Aneita Gwendlyn DASEN, MD;  Location: [facility];  Service: Gastroenterology;  Laterality: N/A;   LUMBAR DISC SURGERY     TONSILLECTOMY     as a child   TOTAL HIP ARTHROPLASTY Right 11/06/2012   Procedure: RIGHT TOTAL HIP ARTHROPLASTY WITH ACETABULAR AUTOGRAFT;  Surgeon: Dempsey LULLA Moan, MD;  Location: [facility];  Service: Orthopedics;  Laterality: Right;   TOTAL HIP ARTHROPLASTY Left 02/18/2014   Procedure: LEFT TOTAL HIP ARTHROPLASTY;  Surgeon: Dempsey Moan LULLA, MD;  Location: [facility];  Service: Orthopedics;  Laterality: Left;   TUBAL LIGATION      reports that  she quit smoking about 51 years ago. Her smoking use included cigarettes. She started smoking about 76 years ago. She has a 12.5 pack-year smoking history. She has never used smokeless tobacco. She reports that she does not drink alcohol  and does not use drugs. family history includes Heart disease in her mother; Hypertension in her brother; Lung cancer in her father. Allergies[1] Medications Ordered Prior to  Encounter[2]   Observations/Objective: Alert, NAD, appropriate mood and affect, resps normal, cn 2-12 intact, moves all 4s, on home o2, has trace edema bilateral, also right mid upper arm with 1/2 cm slightly raised tender pustular like lesion.   Lab Results  Component Value Date   WBC 11.5 (H) 04/20/2024   HGB 11.6 (L) 04/20/2024   HCT 34.0 (L) 04/20/2024   PLT 247 04/20/2024   GLUCOSE 114 (H) 04/20/2024   CHOL 158 11/21/2023   TRIG 67.0 11/21/2023   HDL 66.30 11/21/2023   LDLCALC 79 11/21/2023   ALT 12 04/20/2024   AST 17 04/20/2024   NA 137 04/20/2024   K 4.3 04/20/2024   CL 96 (L) 04/20/2024   CREATININE 1.27 (H) 04/20/2024   BUN 20 04/20/2024   CO2 33 (H) 04/20/2024   TSH 1.48 11/21/2023   INR 1.0 09/13/2023   HGBA1C 5.7 11/09/2022   Assessment and Plan: See notes  Follow Up Instructions: See notes   I discussed the assessment and treatment plan with the patient. The patient was provided an opportunity to ask questions and all were answered. The patient agreed with the plan and demonstrated an understanding of the instructions.   The patient was advised to call back or seek an in-person evaluation if the symptoms worsen or if the condition fails to improve as anticipated.   Lynwood Rush, MD     [1]  Allergies Allergen Reactions   Augmentin  [Amoxicillin -Pot Clavulanate] Diarrhea    Very bad diarrhea   Benazepril  Shortness Of Breath, Swelling and Other (See Comments)    Face became swollen, plus eyes and cheeks (too)   Chlorthalidone Shortness Of Breath  [2]  Current Outpatient Medications on File Prior to Visit  Medication Sig Dispense Refill   acetaminophen  (TYLENOL ) 325 MG tablet Take 2 tablets (650 mg total) by mouth every 6 (six) hours as needed for mild pain (or Fever >/= 101). 40 tablet 0   albuterol  (VENTOLIN  HFA) 108 (90 Base) MCG/ACT inhaler Inhale 2 puffs into the lungs 3 (three) times daily for 3 days, THEN 2 puffs every 4 (four) hours as needed for  wheezing or shortness of breath.     ALPRAZolam  (XANAX ) 0.25 MG tablet TAKE 1 TABLET BY MOUTH TWICE  DAILY AS NEEDED 60 tablet 2   aspirin  EC 81 MG tablet Take 1 tablet (81 mg total) by mouth daily. Swallow whole.     Cholecalciferol (VITAMIN D3) 50 MCG (2000 UT) capsule Take 2,000 Units by mouth daily.     diclofenac  Sodium (VOLTAREN ) 1 % GEL Apply 2 g topically 4 (four) times daily. 150 g 1   fluticasone  (FLONASE ) 50 MCG/ACT nasal spray Place 2 sprays into both nostrils daily for 7 days. 11.1 mL 0   guaiFENesin  (MUCINEX ) 600 MG 12 hr tablet Take 1 tablet (600 mg total) by mouth 2 (two) times daily. 30 tablet 0   ipratropium-albuterol  (DUONEB) 0.5-2.5 (3) MG/3ML SOLN Take 3 mLs by nebulization every 4 (four) hours as needed (wheezing or shortness of breath). 360 mL 5   iron  polysaccharides (NIFEREX) 150 MG capsule Take  1 capsule (150 mg total) by mouth daily. 90 capsule 1   loratadine  (CLARITIN ) 10 MG tablet Take 1 tablet (10 mg total) by mouth daily. 90 tablet 1   meclizine  (ANTIVERT ) 12.5 MG tablet Take 1 tablet (12.5 mg total) by mouth 3 (three) times daily as needed. 40 tablet 1   nitrofurantoin , macrocrystal-monohydrate, (MACROBID ) 100 MG capsule Take 1 capsule (100 mg total) by mouth 2 (two) times daily. 20 capsule 0   nystatin  (MYCOSTATIN ) 100000 UNIT/ML suspension Swish and spit 5mL two times daily after using Symbicort . 473 mL 5   OXYGEN  Inhale 4 L/min into the lungs continuous. Increase to 5L when moving     polyethylene glycol (MIRALAX  / GLYCOLAX ) 17 g packet Take 17 g by mouth daily as needed for mild constipation or moderate constipation.     potassium chloride  SA (KLOR-CON  M20) 20 MEQ tablet Take 1 tablet (20 mEq total) by mouth daily. 90 tablet 3   traMADol  (ULTRAM ) 50 MG tablet TAKE 1 TABLET BY MOUTH EVERY 6  HOURS AS NEEDED FOR PAIN 120 tablet 1   traZODone  (DESYREL ) 50 MG tablet TAKE 1/2 TO 1 TABLET BY MOUTH AT BEDTIME AS NEEDED FOR SLEEP 100 tablet 1   TRELEGY ELLIPTA   200-62.5-25 MCG/ACT AEPB USE 1 INHALATION BY MOUTH ONCE  DAILY AT THE SAME TIME EACH DAY  (DUE FOR APPT, CALL OFFICE) 60 each 11   Turmeric (QC TUMERIC COMPLEX PO) Take 1 tablet by mouth daily.     No current facility-administered medications on file prior to visit.

## 2024-05-31 ENCOUNTER — Encounter: Payer: Self-pay | Admitting: Neurology

## 2024-06-04 ENCOUNTER — Inpatient Hospital Stay

## 2024-06-04 ENCOUNTER — Telehealth: Payer: Self-pay | Admitting: Oncology

## 2024-06-04 ENCOUNTER — Inpatient Hospital Stay: Admitting: Oncology

## 2024-06-04 NOTE — Telephone Encounter (Signed)
 I spoke with patient's daughter to reschedule lab and MD appointments from 06/04/2024 to 06/19/2024.

## 2024-06-19 ENCOUNTER — Inpatient Hospital Stay

## 2024-06-19 ENCOUNTER — Inpatient Hospital Stay: Admitting: Oncology

## 2024-06-23 ENCOUNTER — Ambulatory Visit: Admitting: Nurse Practitioner

## 2024-06-27 ENCOUNTER — Encounter: Admitting: Family Medicine

## 2024-08-05 ENCOUNTER — Encounter: Admitting: Family Medicine

## 2025-05-28 ENCOUNTER — Ambulatory Visit
